# Patient Record
Sex: Male | Born: 1935 | Race: Black or African American | Hispanic: No | Smoking: Never smoker
Health system: Southern US, Community
[De-identification: ages and names within clinical notes are randomized; demographics above are authoritative.]

## PROBLEM LIST (undated history)

## (undated) DIAGNOSIS — E876 Hypokalemia: Secondary | ICD-10-CM

## (undated) DIAGNOSIS — I442 Atrioventricular block, complete: Secondary | ICD-10-CM

## (undated) DIAGNOSIS — Z8679 Personal history of other diseases of the circulatory system: Secondary | ICD-10-CM

## (undated) DIAGNOSIS — E119 Type 2 diabetes mellitus without complications: Secondary | ICD-10-CM

## (undated) DIAGNOSIS — E785 Hyperlipidemia, unspecified: Secondary | ICD-10-CM

## (undated) DIAGNOSIS — E669 Obesity, unspecified: Secondary | ICD-10-CM

## (undated) DIAGNOSIS — T7841XA Arthus phenomenon, initial encounter: Secondary | ICD-10-CM

## (undated) DIAGNOSIS — Z950 Presence of cardiac pacemaker: Secondary | ICD-10-CM

## (undated) DIAGNOSIS — I10 Essential (primary) hypertension: Secondary | ICD-10-CM

## (undated) DIAGNOSIS — I209 Angina pectoris, unspecified: Secondary | ICD-10-CM

## (undated) DIAGNOSIS — N183 Chronic kidney disease, stage 3 unspecified: Secondary | ICD-10-CM

## (undated) DIAGNOSIS — E109 Type 1 diabetes mellitus without complications: Secondary | ICD-10-CM

## (undated) DIAGNOSIS — I5033 Acute on chronic diastolic (congestive) heart failure: Secondary | ICD-10-CM

## (undated) HISTORY — DX: Type 2 diabetes mellitus without complications: E11.9

## (undated) HISTORY — DX: Chronic kidney disease, stage 3 unspecified: N18.30

## (undated) HISTORY — DX: Atrioventricular block, complete: I44.2

## (undated) HISTORY — DX: Angina pectoris, unspecified: I20.9

## (undated) HISTORY — DX: Acute on chronic diastolic (congestive) heart failure: I50.33

## (undated) HISTORY — DX: Essential (primary) hypertension: I10

## (undated) HISTORY — DX: Personal history of other diseases of the circulatory system: Z86.79

## (undated) HISTORY — DX: Arthus phenomenon, initial encounter: T78.41XA

## (undated) HISTORY — DX: Obesity, unspecified: E66.9

## (undated) HISTORY — DX: Hyperlipidemia, unspecified: E78.5

## (undated) HISTORY — DX: Hypokalemia: E87.6

## (undated) HISTORY — DX: Morbid (severe) obesity due to excess calories: E66.01

## (undated) HISTORY — DX: Presence of cardiac pacemaker: Z95.0

## (undated) HISTORY — DX: Type 1 diabetes mellitus without complications: E10.9

## (undated) HISTORY — DX: Chronic kidney disease, stage 3 (moderate): N18.3

---

## 2002-02-14 ENCOUNTER — Encounter: Admission: RE | Admit: 2002-02-14 | Discharge: 2002-02-26 | Payer: Self-pay | Admitting: Internal Medicine

## 2003-06-20 ENCOUNTER — Encounter (INDEPENDENT_AMBULATORY_CARE_PROVIDER_SITE_OTHER): Payer: Self-pay | Admitting: *Deleted

## 2004-06-22 ENCOUNTER — Ambulatory Visit: Payer: Self-pay | Admitting: Internal Medicine

## 2004-06-28 ENCOUNTER — Ambulatory Visit: Payer: Self-pay | Admitting: Internal Medicine

## 2005-04-18 ENCOUNTER — Ambulatory Visit: Payer: Self-pay | Admitting: Internal Medicine

## 2005-10-17 ENCOUNTER — Encounter: Admission: RE | Admit: 2005-10-17 | Discharge: 2005-10-17 | Payer: Self-pay | Admitting: Sports Medicine

## 2005-12-05 ENCOUNTER — Encounter: Admission: RE | Admit: 2005-12-05 | Discharge: 2005-12-05 | Payer: Self-pay | Admitting: Sports Medicine

## 2006-02-20 ENCOUNTER — Ambulatory Visit: Payer: Self-pay | Admitting: Internal Medicine

## 2006-02-27 ENCOUNTER — Ambulatory Visit: Payer: Self-pay | Admitting: Internal Medicine

## 2006-03-27 ENCOUNTER — Encounter (INDEPENDENT_AMBULATORY_CARE_PROVIDER_SITE_OTHER): Payer: Self-pay | Admitting: *Deleted

## 2006-03-27 ENCOUNTER — Inpatient Hospital Stay (HOSPITAL_COMMUNITY): Admission: RE | Admit: 2006-03-27 | Discharge: 2006-03-30 | Payer: Self-pay | Admitting: Orthopedic Surgery

## 2006-03-29 ENCOUNTER — Ambulatory Visit: Payer: Self-pay | Admitting: Internal Medicine

## 2006-04-05 HISTORY — PX: TOTAL HIP ARTHROPLASTY: SHX124

## 2006-10-31 ENCOUNTER — Ambulatory Visit: Payer: Self-pay | Admitting: Internal Medicine

## 2006-10-31 LAB — CONVERTED CEMR LAB
ALT: 20 units/L (ref 0–40)
AST: 20 units/L (ref 0–37)
BUN: 24 mg/dL — ABNORMAL HIGH (ref 6–23)
CO2: 29 meq/L (ref 19–32)
Calcium: 9.2 mg/dL (ref 8.4–10.5)
GFR calc Af Amer: 70 mL/min
GFR calc non Af Amer: 58 mL/min
Potassium: 4.9 meq/L (ref 3.5–5.1)
Total CHOL/HDL Ratio: 3.2
Triglycerides: 68 mg/dL (ref 0–149)

## 2006-11-02 ENCOUNTER — Ambulatory Visit: Payer: Self-pay | Admitting: Internal Medicine

## 2006-12-15 HISTORY — PX: CATARACT EXTRACTION: SUR2

## 2007-03-16 ENCOUNTER — Ambulatory Visit: Payer: Self-pay | Admitting: Internal Medicine

## 2007-03-31 ENCOUNTER — Telehealth: Payer: Self-pay | Admitting: Family Medicine

## 2007-06-29 ENCOUNTER — Encounter: Payer: Self-pay | Admitting: Internal Medicine

## 2007-07-02 ENCOUNTER — Telehealth: Payer: Self-pay | Admitting: Internal Medicine

## 2007-08-06 ENCOUNTER — Ambulatory Visit: Payer: Self-pay | Admitting: Internal Medicine

## 2007-08-06 DIAGNOSIS — T7841XA Arthus phenomenon, initial encounter: Secondary | ICD-10-CM

## 2007-08-06 DIAGNOSIS — E785 Hyperlipidemia, unspecified: Secondary | ICD-10-CM

## 2007-08-06 DIAGNOSIS — E1140 Type 2 diabetes mellitus with diabetic neuropathy, unspecified: Secondary | ICD-10-CM | POA: Insufficient documentation

## 2007-08-06 DIAGNOSIS — E109 Type 1 diabetes mellitus without complications: Secondary | ICD-10-CM

## 2007-08-06 DIAGNOSIS — I10 Essential (primary) hypertension: Secondary | ICD-10-CM

## 2007-08-06 DIAGNOSIS — E1159 Type 2 diabetes mellitus with other circulatory complications: Secondary | ICD-10-CM | POA: Insufficient documentation

## 2007-08-06 HISTORY — DX: Essential (primary) hypertension: I10

## 2007-08-06 HISTORY — DX: Hyperlipidemia, unspecified: E78.5

## 2007-08-06 HISTORY — DX: Type 1 diabetes mellitus without complications: E10.9

## 2007-08-06 LAB — CONVERTED CEMR LAB
ALT: 21 units/L (ref 0–53)
Albumin: 3.9 g/dL (ref 3.5–5.2)
BUN: 32 mg/dL — ABNORMAL HIGH (ref 6–23)
Calcium: 9.3 mg/dL (ref 8.4–10.5)
Chloride: 108 meq/L (ref 96–112)
Creatinine, Ser: 1.4 mg/dL (ref 0.4–1.5)
Creatinine,U: 128.1 mg/dL
GFR calc Af Amer: 64 mL/min
GFR calc non Af Amer: 53 mL/min
HDL: 42.6 mg/dL (ref >39.0)
Hgb A1c MFr Bld: 8.5 % — ABNORMAL HIGH (ref 4.6–6.0)
LDL Cholesterol: 89 mg/dL (ref 0–99)
Microalb, Ur: 44.9 mg/dL — ABNORMAL HIGH (ref 0.0–1.9)
PSA: 1.8 ng/mL (ref 0.10–4.00)
Potassium: 5.1 meq/L (ref 3.5–5.1)
Sodium: 141 meq/L (ref 135–145)
Triglycerides: 58 mg/dL (ref 0–149)
VLDL: 12 mg/dL (ref 0–40)

## 2007-08-07 ENCOUNTER — Encounter: Payer: Self-pay | Admitting: Internal Medicine

## 2007-08-23 ENCOUNTER — Encounter: Payer: Self-pay | Admitting: Internal Medicine

## 2007-11-15 ENCOUNTER — Telehealth: Payer: Self-pay | Admitting: Internal Medicine

## 2007-11-22 ENCOUNTER — Encounter: Payer: Self-pay | Admitting: Internal Medicine

## 2007-12-24 ENCOUNTER — Telehealth: Payer: Self-pay | Admitting: Internal Medicine

## 2008-01-22 ENCOUNTER — Telehealth: Payer: Self-pay | Admitting: Internal Medicine

## 2008-01-25 ENCOUNTER — Telehealth: Payer: Self-pay | Admitting: Internal Medicine

## 2008-01-28 ENCOUNTER — Ambulatory Visit: Payer: Self-pay | Admitting: Internal Medicine

## 2008-01-28 LAB — CONVERTED CEMR LAB
CO2: 25 meq/L (ref 19–32)
Cholesterol: 128 mg/dL (ref 0–200)
GFR calc Af Amer: 55 mL/min
Glucose, Bld: 90 mg/dL (ref 70–99)
Hgb A1c MFr Bld: 8.1 % — ABNORMAL HIGH (ref 4.6–6.0)
LDL Cholesterol: 82 mg/dL (ref 0–99)
Potassium: 4.5 meq/L (ref 3.5–5.1)
Sodium: 140 meq/L (ref 135–145)

## 2008-01-31 ENCOUNTER — Telehealth: Payer: Self-pay | Admitting: Internal Medicine

## 2008-02-04 ENCOUNTER — Ambulatory Visit: Payer: Self-pay | Admitting: Internal Medicine

## 2008-02-04 ENCOUNTER — Telehealth: Payer: Self-pay | Admitting: Internal Medicine

## 2008-02-19 ENCOUNTER — Ambulatory Visit: Payer: Self-pay | Admitting: Internal Medicine

## 2008-06-05 ENCOUNTER — Ambulatory Visit: Payer: Self-pay | Admitting: Internal Medicine

## 2008-07-09 ENCOUNTER — Telehealth: Payer: Self-pay | Admitting: Internal Medicine

## 2008-07-30 ENCOUNTER — Telehealth: Payer: Self-pay | Admitting: Internal Medicine

## 2008-08-11 ENCOUNTER — Telehealth: Payer: Self-pay | Admitting: Internal Medicine

## 2008-08-29 ENCOUNTER — Ambulatory Visit: Payer: Self-pay | Admitting: Internal Medicine

## 2008-08-29 DIAGNOSIS — E1121 Type 2 diabetes mellitus with diabetic nephropathy: Secondary | ICD-10-CM

## 2008-08-29 LAB — CONVERTED CEMR LAB
ALT: 18 units/L (ref 0–53)
Albumin: 3.7 g/dL (ref 3.5–5.2)
CO2: 23 meq/L (ref 19–32)
Cholesterol: 125 mg/dL (ref 0–200)
Creatinine, Ser: 1.8 mg/dL — ABNORMAL HIGH (ref 0.4–1.5)
GFR calc non Af Amer: 47.99 mL/min (ref >60)
Glucose, Bld: 108 mg/dL — ABNORMAL HIGH (ref 70–99)
HDL: 41 mg/dL (ref >39.00)
Hgb A1c MFr Bld: 8.4 % — ABNORMAL HIGH (ref 4.6–6.5)
Potassium: 4.5 meq/L (ref 3.5–5.1)
Sodium: 138 meq/L (ref 135–145)
Total Bilirubin: 1.1 mg/dL (ref 0.3–1.2)
Total Protein: 7.3 g/dL (ref 6.0–8.3)
Triglycerides: 80 mg/dL (ref 0.0–149.0)
VLDL: 16 mg/dL (ref 0.0–40.0)

## 2008-12-29 ENCOUNTER — Encounter: Payer: Self-pay | Admitting: Endocrinology

## 2009-02-24 ENCOUNTER — Ambulatory Visit: Payer: Self-pay | Admitting: Internal Medicine

## 2009-05-25 ENCOUNTER — Telehealth: Payer: Self-pay | Admitting: Internal Medicine

## 2009-09-07 ENCOUNTER — Telehealth: Payer: Self-pay | Admitting: Internal Medicine

## 2009-10-05 ENCOUNTER — Telehealth: Payer: Self-pay | Admitting: Internal Medicine

## 2009-10-16 ENCOUNTER — Encounter (INDEPENDENT_AMBULATORY_CARE_PROVIDER_SITE_OTHER): Payer: Self-pay | Admitting: *Deleted

## 2009-10-16 ENCOUNTER — Telehealth: Payer: Self-pay | Admitting: Gastroenterology

## 2009-10-19 ENCOUNTER — Ambulatory Visit: Payer: Self-pay | Admitting: Gastroenterology

## 2009-11-26 ENCOUNTER — Telehealth: Payer: Self-pay | Admitting: Internal Medicine

## 2009-12-02 ENCOUNTER — Telehealth: Payer: Self-pay | Admitting: Internal Medicine

## 2009-12-08 ENCOUNTER — Ambulatory Visit: Payer: Self-pay | Admitting: Gastroenterology

## 2009-12-10 ENCOUNTER — Encounter: Payer: Self-pay | Admitting: Gastroenterology

## 2009-12-31 ENCOUNTER — Encounter: Payer: Self-pay | Admitting: Internal Medicine

## 2010-01-04 ENCOUNTER — Telehealth: Payer: Self-pay | Admitting: Internal Medicine

## 2010-01-06 ENCOUNTER — Ambulatory Visit: Payer: Self-pay | Admitting: Internal Medicine

## 2010-01-06 ENCOUNTER — Encounter: Payer: Self-pay | Admitting: Internal Medicine

## 2010-01-06 LAB — CONVERTED CEMR LAB
ALT: 18 units/L (ref 0–53)
AST: 21 units/L (ref 0–37)
Alkaline Phosphatase: 93 units/L (ref 39–117)
BUN: 37 mg/dL — ABNORMAL HIGH (ref 6–23)
Bilirubin, Direct: 0.2 mg/dL (ref 0.0–0.3)
CO2: 25 meq/L (ref 19–32)
Calcium: 9.2 mg/dL (ref 8.4–10.5)
Cholesterol: 145 mg/dL (ref 0–200)
Creatinine, Ser: 1.6 mg/dL — ABNORMAL HIGH (ref 0.4–1.5)
GFR calc non Af Amer: 55.57 mL/min (ref >60)
Glucose, Bld: 128 mg/dL — ABNORMAL HIGH (ref 70–99)
PSA: 1.77 ng/mL (ref 0.10–4.00)
Phosphorus: 3.5 mg/dL (ref 2.3–4.6)
Sodium: 141 meq/L (ref 135–145)
TSH: 1.12 microintl units/mL (ref 0.35–5.50)
Total Bilirubin: 1 mg/dL (ref 0.3–1.2)
Total Protein: 7 g/dL (ref 6.0–8.3)
VLDL: 11.2 mg/dL (ref 0.0–40.0)

## 2010-01-09 ENCOUNTER — Encounter: Payer: Self-pay | Admitting: Internal Medicine

## 2010-01-09 LAB — CONVERTED CEMR LAB
Creatinine 24 HR UR: 1418 mg/24hr (ref 800–2000)
Creatinine Clearance: 60 mL/min — ABNORMAL LOW (ref 75–125)
Protein, Ur: 270 mg/24hr — ABNORMAL HIGH (ref 50–100)

## 2010-01-20 ENCOUNTER — Encounter: Payer: Self-pay | Admitting: Internal Medicine

## 2010-02-05 ENCOUNTER — Ambulatory Visit: Payer: Self-pay | Admitting: Internal Medicine

## 2010-04-15 ENCOUNTER — Ambulatory Visit: Payer: Self-pay | Admitting: Internal Medicine

## 2010-04-15 ENCOUNTER — Inpatient Hospital Stay (HOSPITAL_COMMUNITY): Admission: AD | Admit: 2010-04-15 | Discharge: 2010-04-17 | Payer: Self-pay | Admitting: Internal Medicine

## 2010-04-15 ENCOUNTER — Telehealth: Payer: Self-pay | Admitting: Internal Medicine

## 2010-04-15 ENCOUNTER — Ambulatory Visit: Payer: Self-pay | Admitting: Cardiology

## 2010-04-15 ENCOUNTER — Encounter: Payer: Self-pay | Admitting: Internal Medicine

## 2010-04-15 DIAGNOSIS — I209 Angina pectoris, unspecified: Secondary | ICD-10-CM

## 2010-04-15 HISTORY — PX: OTHER SURGICAL HISTORY: SHX169

## 2010-04-19 ENCOUNTER — Telehealth (INDEPENDENT_AMBULATORY_CARE_PROVIDER_SITE_OTHER): Payer: Self-pay | Admitting: *Deleted

## 2010-04-21 ENCOUNTER — Encounter: Payer: Self-pay | Admitting: Internal Medicine

## 2010-04-26 ENCOUNTER — Ambulatory Visit: Payer: Self-pay | Admitting: Internal Medicine

## 2010-04-26 DIAGNOSIS — E876 Hypokalemia: Secondary | ICD-10-CM

## 2010-04-26 LAB — CONVERTED CEMR LAB
Calcium: 9 mg/dL (ref 8.4–10.5)
Chloride: 108 meq/L (ref 96–112)
GFR calc non Af Amer: 55.52 mL/min — ABNORMAL LOW (ref >60.00)
Potassium: 4.8 meq/L (ref 3.5–5.1)

## 2010-05-03 ENCOUNTER — Ambulatory Visit: Payer: Self-pay

## 2010-05-03 ENCOUNTER — Encounter: Payer: Self-pay | Admitting: Internal Medicine

## 2010-06-15 NOTE — Miscellaneous (Signed)
Summary: lec previsit  Clinical Lists Changes  Medications: Added new medication of MOVIPREP 100 GM  SOLR (PEG-KCL-NACL-NASULF-NA ASC-C) As per prep instructions. - Signed Rx of MOVIPREP 100 GM  SOLR (PEG-KCL-NACL-NASULF-NA ASC-C) As per prep instructions.;  #1 x 0;  Signed;  Entered by: Karl Bales RN;  Authorized by: Meryl Dare MD [facility];  Method used: Print then Give to Patient Observations: Added new observation of NKA: T (10/19/2009 13:36)    Prescriptions: MOVIPREP 100 GM  SOLR (PEG-KCL-NACL-NASULF-NA ASC-C) As per prep instructions.  #1 x 0   Entered by:   Karl Bales RN   Authorized by:   Meryl Dare MD [facility]   Signed by:   Karl Bales RN on 10/19/2009   Method used:   Print then Give to Patient   RxID:   [id number]

## 2010-06-15 NOTE — Assessment & Plan Note (Signed)
Summary: RS  BUMP   PHONE   STC   Vital Signs:  Patient profile:   76 year old male Height:      69 inches Weight:      255 pounds BMI:     37.79 O2 Sat:      96 % Temp:     97.2 degrees F oral Pulse rate:   98 / minute BP sitting:   118 / 70  (left arm) Cuff size:   large  Vitals Entered By: Zackery Barefoot CMA (August 29, 2008 1:06 PM)  Primary Care Provider:  Jacques Navy MD   History of Present Illness: Has had a gastroenteritis with nausea and vomiting. They were out in [place] and got this, his wife also had the same symptoms, and has had symptoms since Thursday. Today he did have a recurrence of diarrhea. No blood or mucus in the stool, no abdominal pain or fever. He has had antibiotics prior to dental cleaning. Able to get food and fluids down without difficulty.   Diabetes - reports that his sugars have been OK.  Hyperlipidemia - due for follow-up lab.  Weight management: he reports that he may have finally heard what he has been told about weight and his health. He is making a serious effort to loose weight through smart food choices, decreased portion size and walking. He has lost a few lbs - even before this bout of diarrhea.  Current Medications (verified): 1)  Furosemide 40 Mg  Tabs (Furosemide) .... Take 1 Tablet By Mouth Once A Day 2)  Pravachol 40 Mg  Tabs (Pravastatin Sodium) .... Take 1 Tablet By Mouth Once A Day 3)  Lantus 100 Unit/ml  Soln (Insulin Glargine) .... Inject 20 Units Under The Skin Each Night 4)  Vasotec 20 Mg  Tabs (Enalapril Maleate) .... 2 Once Daily 5)  Humulin 70/30 70-30 % Susp (Insulin Isophane & Regular) .... 65 Units 6)  Adult Aspirin Low Strength 81 Mg Tbdp (Aspirin) .... Once Daily 7)  Osteo Bi-Flex Adv Triple St  Tabs (Misc Natural Products) .... Once Daily 8)  Bd Insulin Syringe Microfine 28g X 1/2" 0.5 Ml Misc (Insulin Syringe-Needle U-100) .... Use As Directed To Inject Lantus and Humulin 9)  Freestyle Lite Test  Strp  (Glucose Blood) .... Test Blood Glucose Three Times A Day (Dx 250.01)  Allergies (verified): No Known Drug Allergies  Past History:  Past Medical History:    ARTHUS PHENOMENON (ICD-995.21)    MORBID OBESITY (ICD-278.01)    ESSENTIAL HYPERTENSION (ICD-401.9)    HYPERLIPIDEMIA (ICD-272.4)    DIABETES MELLITUS, TYPE I, ADULT ONSET (ICD-250.01)  Past Surgical History:    Total hip replacement (04/05/2006)-right    Cataract extraction - August and September '08 Dagoberto Ligas)  Family History:    Reviewed history from 02/19/2008 and no changes required:       father-deceased @83 : CVA, DM, Lipid, HTN       mother-deceased @ 86: CAD/MI, Asthma       Mother's side - uncles and grandfather all died of MI before 50 yrs       Neg- colon cancer, no definite prostate cancer  Social History:    Reviewed history from 08/06/2007 and no changes required:       [employer], [employer] 2 master's degrees; [place] - for certification Education specialist.       Married - '61       2 sons - '62, '68;  5 grandchildren  retired: school principal       active - travels.  Review of Systems       The patient complains of decreased hearing and dyspnea on exertion.  The patient denies anorexia, weight loss, weight gain, chest pain, peripheral edema, abdominal pain, severe indigestion/heartburn, muscle weakness, difficulty walking, abnormal bleeding, and angioedema.    Physical Exam  General:  Obese AA male in NAD Head:  Normocephalic and atraumatic without obvious abnormalities. No apparent alopecia or balding. Eyes:  vision grossly intact, pupils equal, pupils round, corneas and lenses clear, and no injection.   Ears:  R ear normal and L ear normal.   Nose:  no external deformity and no external erythema.   Mouth:  good dentition and no gingival abnormalities.   Neck:  supple.   Lungs:  normal respiratory effort, normal breath sounds, and no wheezes.   Heart:  normal rate, regular rhythm, and no  murmur.   Abdomen:  obese Msk:  no joint tenderness, no joint swelling, no joint warmth, and no joint instability.   Pulses:  2+ radial and 2+ DP Extremities:  trace left pedal edema.   Neurologic:  alert & oriented X3, cranial nerves II-XII intact, and gait normal.   Skin:  turgor normal and color normal.   Cervical Nodes:  no anterior cervical adenopathy and no posterior cervical adenopathy.   Psych:  Oriented X3, memory intact for recent and remote, normally interactive, and good eye contact.    Diabetes Management Exam:    Foot Exam (with socks and/or shoes not present):       Sensory-Pinprick/Light touch:          Left medial foot (L-4): normal          Left dorsal foot (L-5): normal          Left lateral foot (S-1): normal       Sensory-other: decreased deep vibratory sensation    Eye Exam:       Eye Exam done elsewhere          Date: 07/30/2008          Results: normal          Done by: Manley Mason opthal -[place]   Impression & Recommendations:  Problem # 1:  MORBID OBESITY (ICD-278.01) He has started seriously adjusting his eating style to support weight loss as well as exercising. He has lost 9 lbs since his last visit.  Plan: continue current regiemen: goal to loos 1 lb/month  Problem # 2:  ESSENTIAL HYPERTENSION (ICD-401.9)  His updated medication list for this problem includes:    Furosemide 40 Mg Tabs (Furosemide) .Marland Kitchen... Take 1 tablet by mouth once a day    Vasotec 20 Mg Tabs (Enalapril maleate) .Marland Kitchen... 2 once daily  Orders: TLB-BMP (Basic Metabolic Panel-BMET) (80048-METABOL)  BP today: 118/70 Prior BP: 130/90 (02/19/2008)  Good Control.  Plan: continue present meds.  Problem # 3:  DIABETES MELLITUS, TYPE I, ADULT ONSET (ICD-250.01) Patient is adherent to medical regimen and is working on diet/weight loss. A1C 8.4% today.  Plan: redouble efforts on life-style modifications         continue present regimen: he is slowly cuttng back on his insulin as he eats  better and looses weight. He is less hungry on less insulin.         Gave encouragement  His updated medication list for this problem includes:    Lantus 100 Unit/ml Soln (Insulin glargine) ..... Inject 20 units under  the skin each night    Vasotec 20 Mg Tabs (Enalapril maleate) .Marland Kitchen... 2 once daily    Humulin 70/30 70-30 % Susp (Insulin isophane & regular) .Marland KitchenMarland KitchenMarland KitchenMarland Kitchen 65 units    Adult Aspirin Low Strength 81 Mg Tbdp (Aspirin) ..... Once daily  Orders: TLB-A1C / Hgb A1C (Glycohemoglobin) (83036-A1C)  Problem # 4:  CHRONIC KIDNEY DISEASE STAGE II (MILD) (ICD-585.2) Lab today reveals elevated creatnine. Chart reviewed for previous  values and trends:                   8/08               3/09           9/09           4/10                   1.3                 1.4             1.6             1.8  Etiology combined hypertension and poorly controlled diabetes.   Plan: 24 hour urine for creatnine clearance          Increase diligence on risk factor control: tight blood pressure control and bringing A1C to 7% or less          Continue ACE-I          May need nephrology consult this year.   Problem # 5:  Preventive Health Care (ICD-V70.0) Assessment: New Patient with up-to-date colorectal cancer screening; cardiac screening with NST '02. He seen opthalmology.  Plan: further evaluation of CKD.         follow-up A1C in 3 months         Follow-up on BP in 3 months plus home monitoring.  Problem # 6:  HYPERLIPIDEMIA (ICD-272.4)  His updated medication list for this problem includes:    Pravachol 40 Mg Tabs (Pravastatin sodium) .Marland Kitchen... Take 1 tablet by mouth once a day  Orders: TLB-Lipid Panel (80061-LIPID) TLB-Hepatic/Liver Function Pnl (80076-HEPATIC)  Cholesterol 125, HDL 41, LDL 68  Good control.  Complete Medication List: 1)  Furosemide 40 Mg Tabs (Furosemide) .... Take 1 tablet by mouth once a day 2)  Pravachol 40 Mg Tabs (Pravastatin sodium) .... Take 1 tablet by mouth once a day 3)   Lantus 100 Unit/ml Soln (Insulin glargine) .... Inject 20 units under the skin each night 4)  Vasotec 20 Mg Tabs (Enalapril maleate) .... 2 once daily 5)  Humulin 70/30 70-30 % Susp (Insulin isophane & regular) .... 65 units 6)  Adult Aspirin Low Strength 81 Mg Tbdp (Aspirin) .... Once daily 7)  Osteo Bi-flex Adv Triple St Tabs (Misc natural products) .... Once daily 8)  Bd Insulin Syringe Microfine 28g X 1/2" 0.5 Ml Misc (Insulin syringe-needle u-100) .... Use as directed to inject lantus and humulin 9)  Freestyle Lite Test Strp (Glucose blood) .... Test blood glucose three times a day (dx 250.01)    Tests: (1) BMP (METABOL)   Sodium                    138 mEq/L                   135-145   Potassium  4.5 mEq/L                   3.5-5.1   Chloride                  105 mEq/L                   96-112   Carbon Dioxide            23 mEq/L                    19-32   Glucose              [H]  108 mg/dL                   13-08   BUN                  [H]  39 mg/dL                    6-57   Creatinine           [H]  1.8 mg/dL                   8.4-6.9   Calcium                   9.1 mg/dL                   6.2-95.2   GFR                       47.99 mL/min                >60  Tests: (2) Lipid Panel (LIPID)   Cholesterol               125 mg/dL                   8-413     ATP III Classification            Desirable:  < 200 mg/dL                    Borderline High:  200 - 239 mg/dL               High:  > = 240 mg/dL   Triglycerides             80.0 mg/dL                  2.4-401.0     Normal:  <150 mg/dL     Borderline High:  272 - 199 mg/dL   HDL                       53.66 mg/dL                 >44.03   VLDL Cholesterol          16.0 mg/dL                  4.7-42.5   LDL Cholesterol           68 mg/dL                    9-56  CHO/HDL Ratio:  CHD Risk  3                    Men          Women     1/2 Average Risk     3.4          3.3     Average Risk           5.0          4.4     2X Average Risk          9.6          7.1     3X Average Risk          15.0          11.0                           Tests: (3) Hepatic/Liver Function Panel (HEPATIC)   Total Bilirubin           1.1 mg/dL                   1.0-2.7   Direct Bilirubin          0.2 mg/dL                   2.5-3.6   Alkaline Phosphatase      63 U/L                      39-117   AST                       24 U/L                      0-37   ALT                       18 U/L                      0-53   Total Protein             7.3 g/dL                    6.4-4.0   Albumin                   3.7 g/dL                    3.4-7.4  Tests: (4) Hemoglobin A1C (A1C)   Hemoglobin A1C       [H]  8.4 %                       4.6-6.5  Prescriptions: FREESTYLE LITE TEST  STRP (GLUCOSE BLOOD) Test Blood glucose three times a day (Dx 250.01)  #100 x 5   Entered and Authorized by:   Jacques Navy MD   Signed by:   Jacques Navy MD on 08/29/2008   Method used:   Print then Give to Patient   RxID:   [id number] BD INSULIN SYRINGE MICROFINE 28G X 1/2" 0.5 ML MISC (INSULIN SYRINGE-NEEDLE U-100) Use as directed to inject Lantus and Humulin  #100 x 5   Entered and Authorized by:   Jacques Navy MD   Signed by:   Jacques Navy MD  on 08/29/2008   Method used:   Print then Give to Patient   RxID:   [id number] HUMULIN 70/30 70-30 % SUSP (INSULIN ISOPHANE & REGULAR) 65 units  #2 x 12   Entered and Authorized by:   Jacques Navy MD   Signed by:   Jacques Navy MD on 08/29/2008   Method used:   Print then Give to Patient   RxID:   [id number] VASOTEC 20 MG  TABS (ENALAPRIL MALEATE) 2 once daily  #60 x 12   Entered and Authorized by:   Jacques Navy MD   Signed by:   Jacques Navy MD on 08/29/2008   Method used:   Print then Give to Patient   RxID:   [id number] LANTUS 100 UNIT/ML  SOLN (INSULIN GLARGINE) Inject 20 units under the skin each night  #10 x 12    Entered and Authorized by:   Jacques Navy MD   Signed by:   Jacques Navy MD on 08/29/2008   Method used:   Print then Give to Patient   RxID:   [id number] PRAVACHOL 40 MG  TABS (PRAVASTATIN SODIUM) Take 1 tablet by mouth once a day  #30 x 12   Entered and Authorized by:   Jacques Navy MD   Signed by:   Jacques Navy MD on 08/29/2008   Method used:   Print then Give to Patient   RxID:   [id number] FUROSEMIDE 40 MG  TABS (FUROSEMIDE) Take 1 tablet by mouth once a day  #30 x 12   Entered and Authorized by:   Jacques Navy MD   Signed by:   Jacques Navy MD on 08/29/2008   Method used:   Print then Give to Patient   RxID:   [id number]

## 2010-06-15 NOTE — Progress Notes (Signed)
  Phone Note Refill Request Message from:  Fax from Pharmacy on September 07, 2009 4:28 PM  Refills Requested: Medication #1:  HUMULIN 70/30 70-30 % SUSP 65 units Initial call taken by: Ami Bullins CMA,  September 07, 2009 4:28 PM    Prescriptions: HUMULIN 70/30 70-30 % SUSP (INSULIN ISOPHANE & REGULAR) 65 units  #2 x 2   Entered by:   Ami Bullins CMA   Authorized by:   Jacques Navy MD   Signed by:   Bill Salinas CMA on 09/07/2009   Method used:   Faxed to ...       [facility] (retail)       [address]       [place], [place]  [address]       Ph: [phone]       Fax: [phone]   RxID:   [id number]

## 2010-06-15 NOTE — Miscellaneous (Signed)
Summary: Device preload  Clinical Lists Changes  Observations: Added new observation of PPM INDICATN: CHB (04/21/2010 8:21) Added new observation of MAGNET RTE: BOL 100 ERI 85 (04/21/2010 8:21) Added new observation of PPMLEADSTAT2: active (04/21/2010 8:21) Added new observation of PPMLEADSER2: [id number] (04/21/2010 8:21) Added new observation of PPMLEADMOD2: 2088TC (04/21/2010 8:21) Added new observation of PPMLEADLOC2: RV (04/21/2010 8:21) Added new observation of PPMLEADSTAT1: active (04/21/2010 8:21) Added new observation of PPMLEADSER1: [id number] (04/21/2010 8:21) Added new observation of PPMLEADMOD1: 2088TC (04/21/2010 8:21) Added new observation of PPMLEADLOC1: RA (04/21/2010 8:21) Added new observation of PPM IMP MD: Lewayne Bunting, MD (04/21/2010 8:21) Added new observation of PPMLEADDOI2: 04/16/2010 (04/21/2010 8:21) Added new observation of PPMLEADDOI1: 04/16/2010 (04/21/2010 8:21) Added new observation of PPM DOI: 04/16/2010 (04/21/2010 8:21) Added new observation of PPM SERL#: [id number]  (04/21/2010 8:21) Added new observation of PPM MODL#: [id number]  (04/21/2010 0:10) Added new observation of PACEMAKERMFG: St Jude  (04/21/2010 8:21) Added new observation of PACEMAKER MD: Lewayne Bunting, MD  (04/21/2010 8:21)      PPM Specifications Following MD:  Lewayne Bunting, MD     PPM Vendor:  [employer] Jude     PPM Model Number:  [id number]     PPM Serial Number:  [id number] PPM DOI:  04/16/2010     PPM Implanting MD:  Lewayne Bunting, MD  Lead 1    Location: RA     DOI: 04/16/2010     Model #: [id number]     Serial #: [id number]     Status: active Lead 2    Location: RV     DOI: 04/16/2010     Model #: [id number]     Serial #: [id number]     Status: active  Magnet Response Rate:  BOL 100 ERI 85  Indications:  CHB

## 2010-06-15 NOTE — Letter (Signed)
Summary: [facility]   Imported By: Lennie Odor 01/08/2010 11:17:06  _____________________________________________________________________  External Attachment:    Type:   Image     Comment:   External Document

## 2010-06-15 NOTE — Letter (Signed)
Summary: [facility] Instructions   Gastroenterology  [address] [place], [place] [address]   Phone: [phone]  Fax: [phone]       AMANUEL SINKFIELD    29-Sep-1934    MRN: [id number]        Procedure Day /Date: Tuesday 12-08-09     Arrival Time:  7:30 a.m.     Procedure Time:  8:30 a.m.     Location of Procedure:                    _x_  [facility] (4th Floor)  PREPARATION FOR COLONOSCOPY WITH MOVIPREP   Starting 5 days prior to your procedure  12-03-09 Thursday  do not eat nuts, seeds, popcorn, corn, beans, peas,  salads, or any raw vegetables.  Do not take any fiber supplements (e.g. Metamucil, Citrucel, and Benefiber).  THE DAY BEFORE YOUR PROCEDURE         DATE:  12-07-09  DAY:  Monday  1.  Drink clear liquids the entire day-NO SOLID FOOD  2.  Do not drink anything colored red or purple.  Avoid juices with pulp.  No orange juice.  3.  Drink at least 64 oz. (8 glasses) of fluid/clear liquids during the day to prevent dehydration and help the prep work efficiently.  CLEAR LIQUIDS INCLUDE: Water Jello Ice Popsicles Tea (sugar ok, no milk/cream) Powdered fruit flavored drinks Coffee (sugar ok, no milk/cream) Gatorade Juice: apple, white grape, white cranberry  Lemonade Clear bullion, consomm, broth Carbonated beverages (any kind) Strained chicken noodle soup Hard Candy                             4.  In the morning, mix first dose of MoviPrep solution:    Empty 1 Pouch A and 1 Pouch B into the disposable container    Add lukewarm drinking water to the top line of the container. Mix to dissolve    Refrigerate (mixed solution should be used within 24 hrs)  5.  Begin drinking the prep at 5:00 p.m. The MoviPrep container is divided by 4 marks.   Every 15 minutes drink the solution down to the next mark (approximately 8 oz) until the full liter is complete.   6.  Follow completed prep with 16 oz of clear liquid of your choice (Nothing red or  purple).  Continue to drink clear liquids until bedtime.  7.  Before going to bed, mix second dose of MoviPrep solution:    Empty 1 Pouch A and 1 Pouch B into the disposable container    Add lukewarm drinking water to the top line of the container. Mix to dissolve    Refrigerate  THE DAY OF YOUR PROCEDURE      DATE:  12-08-09  DAY:  Tuesday  Beginning at  3:30 a.m. (5 hours before procedure):         1. Every 15 minutes, drink the solution down to the next mark (approx 8 oz) until the full liter is complete.  2. Follow completed prep with 16 oz. of clear liquid of your choice.    3. You may drink clear liquids until  6:30 a.m.  (2 HOURS BEFORE PROCEDURE).   MEDICATION INSTRUCTIONS  Unless otherwise instructed, you should take regular prescription medications with a small sip of water   as early as possible the morning of your procedure.  Diabetic patients - see separate instructions.  Additional medication instructions: Do not take Lasix the day of your procedure before coming in for procedure         OTHER INSTRUCTIONS  You will need a responsible adult at least 74 years of age to accompany you and drive you home.   This person must remain in the waiting room during your procedure.  Wear loose fitting clothing that is easily removed.  Leave jewelry and other valuables at home.  However, you may wish to bring a book to read or  an iPod/MP3 player to listen to music as you wait for your procedure to start.  Remove all body piercing jewelry and leave at home.  Total time from sign-in until discharge is approximately 2-3 hours.  You should go home directly after your procedure and rest.  You can resume normal activities the  day after your procedure.  The day of your procedure you should not:   Drive   Make legal decisions   Operate machinery   Drink alcohol   Return to work  You will receive specific instructions about eating, activities and medications  before you leave.    The above instructions have been reviewed and explained to me by   _______________________    I fully understand and can verbalize these instructions _____________________________ Date _________

## 2010-06-15 NOTE — Progress Notes (Signed)
Summary: Med refill  Phone Note Refill Request Message from:  Fax from Pharmacy on January 04, 2010 11:19 AM  Refills Requested: Medication #1:  PRAVACHOL 40 MG  TABS Take 1 tablet by mouth once a day  Medication #2:  HUMULIN 70/30 70-30 % SUSP 65 units Initial call taken by: Lucious Groves CMA,  January 04, 2010 11:19 AM    New/Updated Medications: PRAVACHOL 40 MG  TABS (PRAVASTATIN SODIUM) Take 1 tablet by mouth once a day Prescriptions: HUMULIN 70/30 70-30 % SUSP (INSULIN ISOPHANE & REGULAR) 65 units  #2 x 0   Entered by:   Lucious Groves CMA   Authorized by:   Jacques Navy MD   Signed by:   Lucious Groves CMA on 01/04/2010   Method used:   Faxed to ...       [facility] (retail)       [address]       [place], [place]  [address]       Ph: [phone]       Fax: [phone]   RxID:   [id number] PRAVACHOL 40 MG  TABS (PRAVASTATIN SODIUM) Take 1 tablet by mouth once a day  #30 x 0   Entered by:   Lucious Groves CMA   Authorized by:   Jacques Navy MD   Signed by:   Lucious Groves CMA on 01/04/2010   Method used:   Faxed to ...       [facility] (retail)       [address]       [place], [place]  [address]       Ph: [phone]       Fax: [phone]   RxID:   [id number]

## 2010-06-15 NOTE — Progress Notes (Signed)
Summary: Patiet Due for Colonoscopy  Phone Note Outgoing Call Call back at [facility] Phone [phone]   Call placed by: Harlow Mares CMA Duncan Dull),  October 16, 2009 10:39 AM Call placed to: Patient Summary of Call: called patient and advised him Dr. Russella Dar reviewed his chart and he is due for his colonoscopy his last colonoscopy was done by Dr. Victorino Dike in 2005. The patient had an EGD preformed by Dr, Russella Dar in the past so he would once agian become Dr. Anselm Jungling patient. The patient would like me to send this note to Dr. Debby Bud and make sure it is ok with him that the patient has his colonoscopy, so I advised him I would.  Initial call taken by: Harlow Mares CMA Duncan Dull),  October 16, 2009 10:43 AM  Follow-up for Phone Call        patients wife called back and scheduled her colonoscopy and his. Follow-up by: Harlow Mares CMA Duncan Dull),  October 16, 2009 11:51 AM

## 2010-06-15 NOTE — Progress Notes (Signed)
  Phone Note Refill Request Message from:  Fax from Pharmacy on December 02, 2009 11:07 AM  Refills Requested: Medication #1:  FUROSEMIDE 40 MG  TABS Take 1 tablet by mouth once a day  Medication #2:  LANTUS 100 UNIT/ML  SOLN Inject 20 units under the skin each night  Medication #3:  VASOTEC 20 MG  TABS 2 once daily Initial call taken by: Ami Bullins CMA,  December 02, 2009 11:08 AM    Prescriptions: VASOTEC 20 MG  TABS (ENALAPRIL MALEATE) 2 once daily  #60 x 1   Entered by:   Ami Bullins CMA   Authorized by:   Jacques Navy MD   Signed by:   Bill Salinas CMA on 12/02/2009   Method used:   Faxed to ...       [facility] (retail)       [address]       [place], [place]  [address]       Ph: [phone]       Fax: [phone]   RxID:   [id number] LANTUS 100 UNIT/ML  SOLN (INSULIN GLARGINE) Inject 20 units under the skin each night  #10 x 2   Entered by:   Bill Salinas CMA   Authorized by:   Jacques Navy MD   Signed by:   Bill Salinas CMA on 12/02/2009   Method used:   Faxed to ...       [facility] (retail)       [address]       [place], [place]  [address]       Ph: [phone]       Fax: [phone]   RxID:   [id number] FUROSEMIDE 40 MG  TABS (FUROSEMIDE) Take 1 tablet by mouth once a day  #30 x 1   Entered by:   Ami Bullins CMA   Authorized by:   Jacques Navy MD   Signed by:   Bill Salinas CMA on 12/02/2009   Method used:   Faxed to ...       [facility] (retail)       [address]       [place], [place]  [address]       Ph: [phone]       Fax: [phone]   RxID:   [id number]

## 2010-06-15 NOTE — Initial Assessments (Signed)
Summary: sob/SD   Vital Signs:  Patient profile:   76 year old male Height:      69 inches Weight:      261 pounds BMI:     38.68 O2 Sat:      98 % on Room air Temp:     97.5 degrees F oral Pulse rate:   34 / minute BP sitting:   102 / 52  (left arm) Cuff size:   regular  Vitals Entered By: Bill Salinas CMA (April 15, 2010 4:50 PM)  O2 Flow:  Room air CC: pt here with c/o SOB/ ab   Primary Care Provider:  Jacques Navy MD  CC:  pt here with c/o SOB/ ab.  History of Present Illness: Patient is seen acutely due to the on-set of acute DOE over the past two days. He reports that minimal activity leaves him breathless. He reports a full feeling in the upper chest but says this may be sinus drainage. This sensation does improve as he rests. He has not had any other intercurrent symptoms or illness. His risk factors include obesity, diabetes, hyperlipidemia, and hypertension. He is now admitted with 3rd  degree AV block and possible angina.   Current Medications (verified): 1)  Furosemide 40 Mg  Tabs (Furosemide) .... Take 1 Tablet By Mouth Once A Day 2)  Pravachol 40 Mg  Tabs (Pravastatin Sodium) .... Take 1 Tablet By Mouth Once A Day 3)  Lantus 100 Unit/ml  Soln (Insulin Glargine) .... Inject 20 Units Under The Skin Each Night 4)  Vasotec 20 Mg  Tabs (Enalapril Maleate) .... 2 Once Daily 5)  Humulin 70/30 70-30 % Susp (Insulin Isophane & Regular) .... 65 Units 6)  Adult Aspirin Low Strength 81 Mg Tbdp (Aspirin) .... Once Daily 7)  Osteo Bi-Flex Adv Triple St  Tabs (Misc Natural Products) .... Once Daily 8)  Bd Insulin Syringe Microfine 28g X 1/2" 0.5 Ml Misc (Insulin Syringe-Needle U-100) .... Use As Directed To Inject Lantus and Humulin 9)  Freestyle Lite Test  Strp (Glucose Blood) .... Test Blood Glucose Three Times A Day (Dx 250.01) 10)  Ciprofloxacin Hcl 500 Mg Tabs (Ciprofloxacin Hcl) .Marland Kitchen.. 1 By Mouth Two Times A Day X 5 Days For Traveler's Diarrhea, X 7 Days For Respiratory  or Skin Infection  Allergies (verified): No Known Drug Allergies  Past History:  Past Medical History: Last updated: 08/29/2008 ARTHUS PHENOMENON (ICD-995.21) MORBID OBESITY (ICD-278.01) ESSENTIAL HYPERTENSION (ICD-401.9) HYPERLIPIDEMIA (ICD-272.4) DIABETES MELLITUS, TYPE I, ADULT ONSET (ICD-250.01)  Past Surgical History: Last updated: 08/29/2008 Total hip replacement (04/05/2006)-right Cataract extraction - August and September '08 Dagoberto Ligas)  Family History: Last updated: 2008-02-26 father-deceased @83 : CVA, DM, Lipid, HTN mother-deceased @ 86: CAD/MI, Asthma Mother's side - uncles and grandfather all died of MI before 50 yrs Neg- colon cancer, no definite prostate cancer  Social History: Last updated: 08/06/2007 [employer], [employer] 2 master's degrees; [employer] - for certification Education specialist. Married - '61 2 sons - '62, '68;  5 grandchildren retired: school principal active - travels.  Review of Systems       The patient complains of chest pain and dyspnea on exertion.  The patient denies anorexia, fever, weight loss, weight gain, decreased hearing, hoarseness, syncope, peripheral edema, headaches, hemoptysis, abdominal pain, severe indigestion/heartburn, muscle weakness, difficulty walking, unusual weight change, and enlarged lymph nodes.    Physical Exam  General:  obese AA male who is no distress but is light-headed with exertion Head:  normocephalic and  atraumatic.   Eyes:  clouded lenses, reactive, sclera clear Ears:  External ear exam shows no significant lesions or deformities.  Otoscopic examination reveals clear canals, tympanic membranes are intact bilaterally without bulging, retraction, inflammation or discharge. Hearing is grossly normal bilaterally. Mouth:  Oral mucosa and oropharynx without lesions or exudates.  Teeth in good repair. Neck:  supple, no thyromegaly, and no carotid bruits.   Chest Wall:  Increase AP diameter Lungs:  normal  respiratory effort, normal breath sounds, no crackles, and no wheezes.   Heart:  severe bradycardia - irregular, no murmur.   Abdomen:  Obese, soft, no guarding or rebound Prostate:  deferred Msk:  No deformity or scoliosis noted of thoracic or lumbar spine.  no joint warmth, no redness over joints, no joint deformities, and no joint instability.   Pulses:  2+ radial  Extremities:  no edema or deformity Neurologic:  alert & oriented X3 and cranial nerves II-XII intact.   Skin:  turgor normal, color normal, no rashes, and no suspicious lesions.   Cervical Nodes:  no anterior cervical adenopathy and no posterior cervical adenopathy.   Psych:  Oriented X3, memory intact for recent and remote, normally interactive, and good eye contact.     Impression & Recommendations:  Problem # 1:  ATRIOVENTRICULAR BLOCK, 3RD DEGREE (ICD-426.0) Patient presenting in complete heart block along with severe DOE and hypotension. He is at high risk for cardiovascular disease  Plan - tele/CCU admit           Cardiology consult for possible emergent pacing.  Problem # 2:  ANGINA, ATYPICAL (ICD-413.9)  Patient with a very high risk profile for CAD now with heart block and atypical chest pain.  Plan - CCU admit           cardiac enzymes x 3, BNP, Cmet, A1C, TSH,    His updated medication list for this problem includes:    Furosemide 40 Mg Tabs (Furosemide) .Marland Kitchen... Take 1 tablet by mouth once a day    Vasotec 20 Mg Tabs (Enalapril maleate) .Marland Kitchen... 2 once daily    Adult Aspirin Low Strength 81 Mg Tbdp (Aspirin) ..... Once daily  Orders: No Charge Patient Arrived (NCPA0) (NCPA0)  Problem # 3:  CHRONIC KIDNEY DISEASE STAGE II (MILD) (ICD-585.2) Will check routine labs. Will be careful for any dye studies.  Problem # 4:  ESSENTIAL HYPERTENSION (ICD-401.9) Patient BP is low due to problems #1, 2  Plan - hold meds until stable.  His updated medication list for this problem includes:    Furosemide 40 Mg Tabs  (Furosemide) .Marland Kitchen... Take 1 tablet by mouth once a day    Vasotec 20 Mg Tabs (Enalapril maleate) .Marland Kitchen... 2 once daily  Problem # 5:  DIABETES MELLITUS, TYPE I, ADULT ONSET (ICD-250.01) Will follow with sliding scale. Will hold lantus until medically stable.  His updated medication list for this problem includes:    Lantus 100 Unit/ml Soln (Insulin glargine) ..... Inject 20 units under the skin each night    Vasotec 20 Mg Tabs (Enalapril maleate) .Marland Kitchen... 2 once daily    Humulin 70/30 70-30 % Susp (Insulin isophane & regular) .Marland KitchenMarland KitchenMarland KitchenMarland Kitchen 65 units    Adult Aspirin Low Strength 81 Mg Tbdp (Aspirin) ..... Once daily  Complete Medication List: 1)  Furosemide 40 Mg Tabs (Furosemide) .... Take 1 tablet by mouth once a day 2)  Pravachol 40 Mg Tabs (Pravastatin sodium) .... Take 1 tablet by mouth once a day 3)  Lantus 100 Unit/ml  Soln (Insulin glargine) .... Inject 20 units under the skin each night 4)  Vasotec 20 Mg Tabs (Enalapril maleate) .... 2 once daily 5)  Humulin 70/30 70-30 % Susp (Insulin isophane & regular) .... 65 units 6)  Adult Aspirin Low Strength 81 Mg Tbdp (Aspirin) .... Once daily 7)  Osteo Bi-flex Adv Triple St Tabs (Misc natural products) .... Once daily 8)  Bd Insulin Syringe Microfine 28g X 1/2" 0.5 Ml Misc (Insulin syringe-needle u-100) .... Use as directed to inject lantus and humulin 9)  Freestyle Lite Test Strp (Glucose blood) .... Test blood glucose three times a day (dx 250.01) 10)  Ciprofloxacin Hcl 500 Mg Tabs (Ciprofloxacin hcl) .Marland Kitchen.. 1 by mouth two times a day x 5 days for traveler's diarrhea, x 7 days for respiratory or skin infection   Orders Added: 1)  No Charge Patient Arrived (NCPA0) [NCPA0]

## 2010-06-15 NOTE — Progress Notes (Signed)
Summary: LABS?  Phone Note Call from Patient   Caller: Wife -  Summary of Call: Pt's wife was told to have labs when d/c'd from the hospital. Possibly potassium? They want to do labs here at [facility], are you aware of what labs pt needs?  Initial call taken by: Lamar Sprinkles, CMA,  April 19, 2010 10:27 AM  Follow-up for Phone Call        not sure. Will check CBC -285.9, metabolicn401.9, 995.20. That should do it. Thanks Follow-up by: Jacques Navy MD,  April 19, 2010 2:29 PM  Additional Follow-up for Phone Call Additional follow up Details #1::        called pt no answer will call back later Additional Follow-up by: Ami Bullins CMA,  April 19, 2010 3:57 PM    Additional Follow-up for Phone Call Additional follow up Details #2::    Labs in IDX.................Marland KitchenLamar Sprinkles, CMA  April 19, 2010 4:15 PM   Called spoke with wife and advised per MD. She states that lab order is no longer needed bc caridology called here and gave lab orders for 04/26/10 and will send them to Dr. Debby Bud.[name] CMA  April 20, 2010 11:41 AM

## 2010-06-15 NOTE — Progress Notes (Signed)
  Phone Note Refill Request Message from:  Fax from Pharmacy on November 26, 2009 8:53 AM  Refills Requested: Medication #1:  BD INSULIN SYRINGE MICROFINE 28G X 1/2" 0.5 ML MISC Use as directed to inject Lantus and Humulin Initial call taken by: Ami Bullins CMA,  November 26, 2009 8:53 AM    Prescriptions: BD INSULIN SYRINGE MICROFINE 28G X 1/2" 0.5 ML MISC (INSULIN SYRINGE-NEEDLE U-100) Use as directed to inject Lantus and Humulin  #100 x 5   Entered by:   Ami Bullins CMA   Authorized by:   Jacques Navy MD   Signed by:   Bill Salinas CMA on 11/26/2009   Method used:   Faxed to ...       [facility] (retail)       [address]       [place], [place]  [address]       Ph: [phone]       Fax: [phone]   RxID:   [id number]

## 2010-06-15 NOTE — Letter (Signed)
Summary: Diabetic Instructions  [facility]  [address] [place], [place] [address]   Phone: [phone]  Fax: [phone]    Connor Dixon April 12, 1935 MRN: [id number]    ________________________________________________________________________  _x _   INSULIN (LONG ACTING) MEDICATION INSTRUCTIONS (Lantus, NPH, 70/30, Humulin, Novolin-N)   The day before your procedure:   Take  your regular evening dose    The day of your procedure:   Do not take your morning dose

## 2010-06-15 NOTE — Procedures (Signed)
Summary: Colonoscopy  Patient: [name] Note: All result statuses are Final unless otherwise noted.  Tests: (1) Colonoscopy (COL)   COL Colonoscopy           DONE     [facility]     [address]     [place], [place]  [address]           COLONOSCOPY PROCEDURE REPORT           PATIENT:  Connor Dixon, Connor Dixon  MR#:  [id number]     BIRTHDATE:  1934/09/14, 74 yrs. old  GENDER:  male     ENDOSCOPIST:  Judie Petit T. Russella Dar, MD, [facility]           PROCEDURE DATE:  12/08/2009     PROCEDURE:  Colonoscopy with biopsy     ASA CLASS:  Class II     INDICATIONS:  1) Routine Risk Screening     MEDICATIONS:   Fentanyl 75 mcg IV, Versed 7 mg IV     DESCRIPTION OF PROCEDURE:   After the risks benefits and     alternatives of the procedure were thoroughly explained, informed     consent was obtained. Digital rectal exam was performed and     revealed no abnormalities. The LB PCF-H180AL [id number] and LB     CF-H180AL [id number] endoscope was introduced through the anus and     advanced to the ileocecal valve, limited by a tortuous and     redudant colon. The quality of the prep was excellent, using     MoviPrep.  The instrument was then slowly withdrawn as the colon     was fully examined.     <<PROCEDUREIMAGES>>     FINDINGS:  A sessile polyp was found in the ascending colon. It     was 4 mm in size. The polyp was removed using cold biopsy forceps.     Two polyps were found at the hepatic flexure. They were 4 - 5 mm     in size. The polyps were removed using cold biopsy forceps.  Mild     diverticulosis was found in the ascending colon.  Melanosis coli     was found in the rectum and sigmoid colon.  This was otherwise a     normal examination of the colon.   Retroflexed views in the rectum     revealed no abnormalities.  The time to cecum =  25.5  minutes.     The scope was then withdrawn (time =  12  min) from the patient     and the procedure completed.           COMPLICATIONS:  None        ENDOSCOPIC IMPRESSION:     1) 4 mm sessile polyp in the ascending colon     2) 4 - 5 mm, two polyps at the hepatic flexure     3) Mild diverticulosis in the ascending colon     4) Melanosis in the rectum and sigmoid colon           RECOMMENDATIONS:     1) Await pathology results     2) If the polyps removed today are adenomatous (pre-cancerous),     you will need a repeat colonoscopy in 5 years. Otherwise you     should continue to follow colorectal cancer screening guidelines     for "routine risk" patients with colonoscopy in 10 years.     Venita Lick. Russella Dar,  MD, [name]           CC: Jacques Navy, MD           n.     Rosalie DoctorVenita Lick. Stark at 12/08/2009 02:50 PM           Lonna Duval, [id number]  Note: An exclamation mark (!) indicates a result that was not dispersed into the flowsheet. Document Creation Date: 12/08/2009 2:50 PM _______________________________________________________________________  (1) Order result status: Final Collection or observation date-time: 12/08/2009 09:21 Requested date-time:  Receipt date-time:  Reported date-time:  Referring Physician:   Ordering Physician: Claudette Head [id number]) Specimen Source:  Source: Launa Grill Order Number: [id number] Lab site:   Appended Document: Colonoscopy     Procedures Next Due Date:    Colonoscopy: 12/2014

## 2010-06-15 NOTE — Letter (Signed)
Summary: Patient Notice- Polyp Results  [facility]  [address] [place], [place] [address]   Phone: [phone]  Fax: [phone]        December 10, 2009 MRN: [id number]    RISHAAN GUNNER [address] [place], [place]  [address]    Dear Mr. NORGAARD,  I am pleased to inform you that the colon polyp(s) removed during your recent colonoscopy was (were) found to be benign (no cancer detected) upon pathologic examination.  I recommend you have a repeat colonoscopy examination in 5 years to look for recurrent polyps, as having colon polyps increases your risk for having recurrent polyps or even colon cancer in the future.  Should you develop new or worsening symptoms of abdominal pain, bowel habit changes or bleeding from the rectum or bowels, please schedule an evaluation with either your primary care physician or with me.  Continue treatment plan as outlined the day of your exam.  Please call us if you are having persistent problems or have questions about your condition that have not been fully answered at this time.  Sincerely,  Meryl Dare MD [facility]  This letter has been electronically signed by your physician.  Appended Document: Patient Notice- Polyp Results letter mailed 8.1.2011

## 2010-06-15 NOTE — Progress Notes (Signed)
Summary: OV TODAY  Phone Note Call from Patient   Summary of Call: Pt c/o feeling "winded" after only a few steps. He is currently in [facility] - OK to add on end of day as soon as pt can get to office. Wife informed.  Initial call taken by: Lamar Sprinkles, CMA,  April 15, 2010 11:33 AM

## 2010-06-15 NOTE — Assessment & Plan Note (Signed)
Summary: YEARLY FU/ MEDICARE/ NEEDS REFILLS ON INSULIN/NWS   Vital Signs:  Patient profile:   76 year old male Height:      69 inches Weight:      265 pounds BMI:     39.28 O2 Sat:      96 % on Room air Temp:     97.9 degrees F oral Pulse rate:   83 / minute BP sitting:   132 / 90  (left arm) Cuff size:   regular  Vitals Entered By: Bill Salinas CMA (January 06, 2010 9:47 AM)  O2 Flow:  Room air CC: yearly / ab  Vision Screening:      Vision Comments: Pt had eye exam August 2011. Stable exam no further changes. Dr Dagoberto Ligas Background diabetic retinopathy was detected, but only requires monitoring (left eye) Next eye exam due in 1 year   Primary Care Marquisha Nikolov:  Jacques Navy MD  CC:  yearly / ab.  History of Present Illness: Patient presents for routine medical follow-up. In the interval he has been having close follow-up for right hip ORIF with DePUy prosthesis - and his orthopedist has been checking blood work and x-rays with no problem to date. He does have some minor discomfort. He does report having some chronic back pain but this is manageable.  No major illness or injury. He did have a colonoscopy with polypectomy with final path being tubular adenoma. Generally feels well and able to be as active as he wants to be. He is not exercising.  Preventive Screening-Counseling & Management  Alcohol-Tobacco     Alcohol drinks/day: occasionally     Alcohol type: Beer, wine and whiskey     Smoking Status: quit     Year Quit: 45 years ago  Caffeine-Diet-Exercise     Caffeine use/day: 4 drinks per day     Does Patient Exercise: no     MSH Depression Score: depressed  Hep-HIV-STD-Contraception     Dental Visit-last 6 months yes     Sun Exposure-Excessive: no  Safety-Violence-Falls     Seat Belt Use: yes     Firearms in the Home: firearms in the home     Smoke Detectors: yes     Violence in the Home: no risk noted     Sexual Abuse: no     Fall Risk: No falls in  the past year  Current Medications (verified): 1)  Furosemide 40 Mg  Tabs (Furosemide) .... Take 1 Tablet By Mouth Once A Day 2)  Pravachol 40 Mg  Tabs (Pravastatin Sodium) .... Take 1 Tablet By Mouth Once A Day 3)  Lantus 100 Unit/ml  Soln (Insulin Glargine) .... Inject 20 Units Under The Skin Each Night 4)  Vasotec 20 Mg  Tabs (Enalapril Maleate) .... 2 Once Daily 5)  Humulin 70/30 70-30 % Susp (Insulin Isophane & Regular) .... 65 Units 6)  Adult Aspirin Low Strength 81 Mg Tbdp (Aspirin) .... Once Daily 7)  Osteo Bi-Flex Adv Triple St  Tabs (Misc Natural Products) .... Once Daily 8)  Bd Insulin Syringe Microfine 28g X 1/2" 0.5 Ml Misc (Insulin Syringe-Needle U-100) .... Use As Directed To Inject Lantus and Humulin 9)  Freestyle Lite Test  Strp (Glucose Blood) .... Test Blood Glucose Three Times A Day (Dx 250.01)  Allergies (verified): No Known Drug Allergies  Past History:  Past Medical History: Last updated: 08/29/2008 ARTHUS PHENOMENON (ICD-995.21) MORBID OBESITY (ICD-278.01) ESSENTIAL HYPERTENSION (ICD-401.9) HYPERLIPIDEMIA (ICD-272.4) DIABETES MELLITUS, TYPE I, ADULT ONSET (ICD-250.01)  Past Surgical History: Last updated: 08/29/2008 Total hip replacement (04/05/2006)-right Cataract extraction - August and September '08 Dagoberto Ligas)  Family History: Last updated: Feb 26, 2008 father-deceased @83 : CVA, DM, Lipid, HTN mother-deceased @ 86: CAD/MI, Asthma Mother's side - uncles and grandfather all died of MI before 50 yrs Neg- colon cancer, no definite prostate cancer  Social History: Last updated: 08/06/2007 [employer], [employer] 2 master's degrees; [place] - for certification Education specialist. Married - '61 2 sons - '62, '68;  5 grandchildren retired: school principal active - travels.  Risk Factors: Alcohol Use: occasionally (01/06/2010) Caffeine Use: 4 drinks per day (01/06/2010) Exercise: no (01/06/2010)  Risk Factors: Smoking Status: quit  (01/06/2010)  Social History: Caffeine use/day:  4 drinks per day Dental Care w/in 6 mos.:  yes Sun Exposure-Excessive:  no Seat Belt Use:  yes Fall Risk:  No falls in the past year  Review of Systems       The patient complains of weight gain.  The patient denies anorexia, fever, weight loss, vision loss, decreased hearing, hoarseness, chest pain, dyspnea on exertion, abdominal pain, severe indigestion/heartburn, hematuria, incontinence, muscle weakness, transient blindness, depression, abnormal bleeding, and angioedema.         sinus drainage   Physical Exam  General:  obese AA male in no distress Head:  normocephalic, atraumatic, and no abnormalities observed.   Eyes:  vision grossly intact, pupils equal, pupils round, pupils reactive to light, corneas and lenses clear, and no injection.   Ears:  External ear exam shows no significant lesions or deformities.  Otoscopic examination reveals clear canals, tympanic membranes are intact bilaterally without bulging, retraction, inflammation or discharge. Hearing is grossly normal bilaterally. Nose:  no external deformity and no external erythema.   Mouth:  Oral mucosa and oropharynx without lesions or exudates.  Teeth in good repair. Neck:  supple, full ROM, no thyromegaly, and no carotid bruits.   Chest Wall:  no deformities and no tenderness.   Breasts:  gynecomastia secondary to obesity.   Lungs:  Normal respiratory effort, chest expands symmetrically. Lungs are clear to auscultation, no crackles or wheezes. Heart:  Normal rate and regular rhythm. S1 and S2 normal without gallop, murmur, click, rub or other extra sounds. Abdomen:  obese, soft, non-tender, normal bowel sounds, no guarding, and no rigidity.   Rectal:  deferred Msk:  normal ROM, no joint tenderness, no joint swelling, no joint warmth, and no joint deformities.   Pulses:  2+ radial and DP pulses Extremities:  No clubbing, cyanosis, edema, or deformity noted with normal  full range of motion of all joints.   Neurologic:  alert & oriented X3, cranial nerves II-XII intact, strength normal in all extremities, gait normal, and DTRs symmetrical and normal.   Skin:  turgor normal, color normal, no rashes, no suspicious lesions, and no ulcerations.   Cervical Nodes:  no anterior cervical adenopathy and no posterior cervical adenopathy.   Psych:  Oriented X3, memory intact for recent and remote, normally interactive, and good eye contact.  Cognitively intact managing his personal affairs and business without difficulty, able to make complex travel arrangements and schedules his own transportation.  Diabetes Management Exam:    Foot Exam (with socks and/or shoes not present):       Sensory-Pinprick/Light touch:          Left medial foot (L-4): normal          Left dorsal foot (L-5): normal          Left lateral  foot (S-1): normal          Right medial foot (L-4): normal          Right dorsal foot (L-5): normal          Right lateral foot (S-1): normal       Sensory-other: diminished deep vibratory sensation distal foot       Inspection:          Left foot: normal          Right foot: normal       Nails:          Left foot: normal          Right foot: normal    Eye Exam:       Eye Exam done elsewhere          Date: 12/31/2009          Results: diabetic retinopathy          Done by: Dr. Dagoberto Ligas   Impression & Recommendations:  Problem # 1:  CHRONIC KIDNEY DISEASE STAGE II (MILD) (ICD-585.2) Patient's last creatinine was 1.8. He is consistently out of control re: diabetes  Plan - 24 hr urine for quantitative creatinine clearance and total protein  Orders: TLB-Renal Function Panel (80069-RENAL) T-Urine 24 Hr. Protein [phone]) T-Urine 24 Hr. Creatinine Clearance (09811-91478)  Problem # 2:  MORBID OBESITY (ICD-278.01) Reviewed with the patient the ULTIMATE importance of controlling his weight.  Plan - smart choice diet, PORTION SIZE CONTROL,  daily exercise. Target is to loose 65 lbs, goal is to loose 1 lb/month, 12 lbs per year.  Problem # 3:  ESSENTIAL HYPERTENSION (ICD-401.9)  His updated medication list for this problem includes:    Furosemide 40 Mg Tabs (Furosemide) .Marland Kitchen... Take 1 tablet by mouth once a day    Vasotec 20 Mg Tabs (Enalapril maleate) .Marland Kitchen... 2 once daily  BP today: 132/90 Prior BP: 118/70 (08/29/2008)  Adequate control of BP-will continue present meds  Problem # 4:  HYPERLIPIDEMIA (ICD-272.4) Previous lab with good control.  Plan - routine lab with recommendations to follow  His updated medication list for this problem includes:    Pravachol 40 Mg Tabs (Pravastatin sodium) .Marland Kitchen... Take 1 tablet by mouth once a day  Orders: TLB-Lipid Panel (80061-LIPID) TLB-Hepatic/Liver Function Pnl (80076-HEPATIC) TLB-TSH (Thyroid Stimulating Hormone) (84443-TSH)  Addendum - good control with LDL 90  Problem # 5:  DIABETES MELLITUS, TYPE I, ADULT ONSET (ICD-250.01)  His updated medication list for this problem includes:    Lantus 100 Unit/ml Soln (Insulin glargine) ..... Inject 20 units under the skin each night    Vasotec 20 Mg Tabs (Enalapril maleate) .Marland Kitchen... 2 once daily    Humulin 70/30 70-30 % Susp (Insulin isophane & regular) .Marland KitchenMarland KitchenMarland KitchenMarland Kitchen 65 units    Adult Aspirin Low Strength 81 Mg Tbdp (Aspirin) ..... Once daily  Orders: TLB-A1C / Hgb A1C (Glycohemoglobin) (83036-A1C) EKG w/ Interpretation (93000)  Labs Reviewed: Creat: 1.8 (08/29/2008)     Last Eye Exam: diabetic retinopathy (12/31/2009) Reviewed HgBA1c results: 8.4 (08/29/2008)  8.1 (01/28/2008)  Chronically out of control. Reviewed the importance of control re: target organ damage - renal failure, heart failure and coronary disease, peripheral vascular disease.   Plan - A1C with recommendations to follow           DIET MANAGEMENT - no sugar, low carbs.  Addendum - A1C 9.1%  Problem # 6:  Preventive Health Care (ICD-V70.0)  Interval history per HPI.  Physical  exam OK except for obesity. Current with colonosocopy. Immunizations - will need paper record to check on pneumo-vax, tetnus. He will need shingles vaccine. 12 Lead EKG with RBBB, t-wave inversion across precordial leads, no acute change or ischemia.   patient with no signs or symptoms of depression. He is counseled on weight and exercise. He is cautioned about fall risk in setting of developing peripheral neuropathy and cautioned about injury especially with lifting. He remains independent in all ADLs  In summary - a very nice man who needs better discipline in regard to controlling his diabetes and weight. He will return in 6 months.   Orders: [facility] -Subsequent Annual Wellness Visit [phone])  Complete Medication List: 1)  Furosemide 40 Mg Tabs (Furosemide) .... Take 1 tablet by mouth once a day 2)  Pravachol 40 Mg Tabs (Pravastatin sodium) .... Take 1 tablet by mouth once a day 3)  Lantus 100 Unit/ml Soln (Insulin glargine) .... Inject 20 units under the skin each night 4)  Vasotec 20 Mg Tabs (Enalapril maleate) .... 2 once daily 5)  Humulin 70/30 70-30 % Susp (Insulin isophane & regular) .... 65 units 6)  Adult Aspirin Low Strength 81 Mg Tbdp (Aspirin) .... Once daily 7)  Osteo Bi-flex Adv Triple St Tabs (Misc natural products) .... Once daily 8)  Bd Insulin Syringe Microfine 28g X 1/2" 0.5 Ml Misc (Insulin syringe-needle u-100) .... Use as directed to inject lantus and humulin 9)  Freestyle Lite Test Strp (Glucose blood) .... Test blood glucose three times a day (dx 250.01) 10)  Ciprofloxacin Hcl 500 Mg Tabs (Ciprofloxacin hcl) .Marland Kitchen.. 1 by mouth two times a day x 5 days for traveler's diarrhea, x 7 days for respiratory or skin infection  Other Orders: TLB-PSA (Prostate Specific Antigen) (84153-PSA) Prescriptions: CIPROFLOXACIN HCL 500 MG TABS (CIPROFLOXACIN HCL) 1 by mouth two times a day x 5 days for traveler's diarrhea, x 7 days for respiratory or skin infection  #20 x 1   Entered  and Authorized by:   Jacques Navy MD   Signed by:   Jacques Navy MD on 01/06/2010   Method used:   Faxed to ...       [facility] (retail)       [address]       [place], [place]  [address]       Ph: [phone]       Fax: [phone]   RxID:   [id number] HUMULIN 70/30 70-30 % SUSP (INSULIN ISOPHANE & REGULAR) 65 units  #2 x 12   Entered and Authorized by:   Jacques Navy MD   Signed by:   Jacques Navy MD on 01/06/2010   Method used:   Faxed to ...       [facility] (retail)       [address]       [place], [place]  [address]       Ph: [phone]       Fax: [phone]   RxID:   [id number] VASOTEC 20 MG  TABS (ENALAPRIL MALEATE) 2 once daily  #60 x 12   Entered and Authorized by:   Jacques Navy MD   Signed by:   Jacques Navy MD on 01/06/2010   Method used:   Faxed to ...       [facility] (retail)  [address]       [place], [place]  [address]       Ph: [phone]       Fax: [phone]   RxID:   [id number] LANTUS 100 UNIT/ML  SOLN (INSULIN GLARGINE) Inject 20 units under the skin each night  #10 x 12   Entered and Authorized by:   Jacques Navy MD   Signed by:   Jacques Navy MD on 01/06/2010   Method used:   Faxed to ...       [facility] (retail)       [address]       [place], [place]  [address]       Ph: [phone]       Fax: [phone]   RxID:   [id number] PRAVACHOL 40 MG  TABS (PRAVASTATIN SODIUM) Take 1 tablet by mouth once a day  #30 x 12   Entered and Authorized by:   Jacques Navy MD   Signed by:   Jacques Navy MD on 01/06/2010   Method used:   Faxed to ...       [facility] (retail)       [address]       [place], [place]  [address]       Ph: [phone]       Fax: [phone]   RxID:   [id number] FUROSEMIDE 40 MG   TABS (FUROSEMIDE) Take 1 tablet by mouth once a day  #30 x 12   Entered and Authorized by:   Jacques Navy MD   Signed by:   Jacques Navy MD on 01/06/2010   Method used:   Faxed to ...       [facility] (retail)       [address]       [place], [place]  [address]       Ph: [phone]       Fax: [phone]   RxID:   [id number]

## 2010-06-15 NOTE — Progress Notes (Signed)
  Phone Note Refill Request Message from:  Fax from Pharmacy on May 25, 2009 1:34 PM  Refills Requested: Medication #1:  BD INSULIN SYRINGE MICROFINE 28G X 1/2" 0.5 ML MISC Use as directed to inject Lantus and Humulin Initial call taken by: Ami Bullins CMA,  May 25, 2009 1:34 PM    Prescriptions: BD INSULIN SYRINGE MICROFINE 28G X 1/2" 0.5 ML MISC (INSULIN SYRINGE-NEEDLE U-100) Use as directed to inject Lantus and Humulin  #100 x 5   Entered by:   Ami Bullins CMA   Authorized by:   Jacques Navy MD   Signed by:   Bill Salinas CMA on 05/25/2009   Method used:   Faxed to ...       [facility] (retail)       [address]       [place], [place]  [address]       Ph: [phone]       Fax: [phone]   RxID:   [id number]

## 2010-06-15 NOTE — Assessment & Plan Note (Signed)
Summary: flu shot/men/cd  Nurse Visit   Allergies: No Known Drug Allergies  Orders Added: 1)  Flu Vaccine 38yrs + MEDICARE PATIENTS [Q2039] 2)  Administration Flu vaccine - MCR [G0008]      Flu Vaccine Consent Questions     Do you have a history of severe allergic reactions to this vaccine? no    Any prior history of allergic reactions to egg and/or gelatin? no    Do you have a sensitivity to the preservative Thimersol? no    Do you have a past history of Guillan-Barre Syndrome? no    Do you currently have an acute febrile illness? no    Have you ever had a severe reaction to latex? no    Vaccine information given and explained to patient? yes    Are you currently pregnant? no    Lot Number:[id number]   Exp Date:11/13/2010   Site Given  Left Deltoid IMu

## 2010-06-15 NOTE — Progress Notes (Signed)
  Phone Note Refill Request Message from:  Fax from Pharmacy on Oct 05, 2009 3:33 PM  Refills Requested: Medication #1:  PRAVACHOL 40 MG  TABS Take 1 tablet by mouth once a day Initial call taken by: Ami Bullins CMA,  Oct 05, 2009 3:33 PM    Prescriptions: PRAVACHOL 40 MG  TABS (PRAVASTATIN SODIUM) Take 1 tablet by mouth once a day  #30 x 2   Entered by:   Ami Bullins CMA   Authorized by:   Jacques Navy MD   Signed by:   Bill Salinas CMA on 10/05/2009   Method used:   Faxed to ...       [facility] (retail)       [address]       [place], [place]  [address]       Ph: [phone]       Fax: [phone]   RxID:   [id number]

## 2010-06-15 NOTE — Procedures (Signed)
Summary: Colonoscopy - Dr. Terrial Rhodes   Procedures Next Due Date:    Colonoscopy: 06/2008 Patient Name: Connor Dixon, Connor Dixon MRN:  Procedure Procedures: Colonoscopy CPT: 57846.  Personnel: Endoscopist: Ulyess Mort, MD.  Exam Location: Exam performed in Outpatient Clinic. Outpatient  Patient Consent: Procedure, Alternatives, Risks and Benefits discussed, consent obtained, from patient. Consent was obtained by the RN.  Indications  Average Risk Screening Routine.  History  Current Medications: Patient is not currently taking Coumadin.  Pre-Exam Physical: Entire physical exam was normal.  Exam Exam: Extent of exam reached: Cecum, extent intended: Cecum.  The cecum was identified by appendiceal orifice and IC valve. Colon retroflexion performed. Images were not taken. ASA Classification: II. Tolerance: good.  Monitoring: Pulse and BP monitoring, Oximetry used. Supplemental O2 given.  Sedation Meds: Patient assessed and found to be appropriate for moderate (conscious) sedation. Fentanyl 100 mcg. given IV. Versed 10 mg. given IV.  Findings - NOT SEEN ON EXAM: Cecum. Polyps, AVM's, Colitis, Tumors, Crohn's, Diverticulosis, Hemorrhoids, Comments: has moderate melanosis coli.   Assessment Normal examination.  Events  Unplanned Interventions: No intervention was required.  Unplanned Events: There were no complications. Plans Medication Plan: Continue current medications. Fiber supplements:  Anti-constipation:  reglan or zelnorm   Patient Education: Patient given standard instructions for: Constipation.a normal exam. Yearly hemoccult testing recommended. Patient instructed to get routine colonoscopy every 5 years.  Disposition: After procedure patient sent to recovery. After recovery patient sent home.    CC: Illene Regulus, MD  This report was created from the original endoscopy report, which was reviewed and signed by the above listed endoscopist.

## 2010-06-15 NOTE — Letter (Signed)
[facility] [address] [place], [place]  [address] Phone: [phone]      January 20, 2010   YORDY MATTON [address] [place], [place] [address]  RE:  LAB RESULTS  Dear  Mr. PERKO,  The following is an interpretation of your most recent lab tests.  Please take note of any instructions provided or changes to medications that have resulted from your lab work.  ELECTROLYTES:  Stable - no changes needed  KIDNEY FUNCTION TESTS:  Stable - no changes needed  LIPID PANEL:  Good - no changes needed Triglyceride: 56.0   Cholesterol: 145   LDL: 90   HDL: 43.60   Chol/HDL%:  3   DIABETIC STUDIES:  Poor - schedule a follow-up appointment soon Blood Glucose: 128   HgbA1C: 9.1   Microalbumin/Creatinine Ratio: 350.5     24 hr urine for creatinine clearance 49ml/min with normal being 75 plus. the total protein excretion at 270 was minimally elevated.  These results indicate early diabetic glomerulonephropathy - kidney damage from diabetes. Plan - conitnue to work to get better control of blood sugar.   Sincerely Yours,    Jacques Navy MD  Patient: Connor Dixon Note: All result statuses are Final unless otherwise noted.  Tests: (1) Renal Function Panel (RENAL)   Sodium                    141 mEq/L                   135-145   Potassium            [H]  5.3 mEq/L                   3.5-5.1   Chloride                  108 mEq/L                   96-112   Carbon Dioxide            25 mEq/L                    19-32   Calcium                   9.2 mg/dL                   0.2-54.2   Albumin                   4.0 g/dL                    7.0-6.2   BUN                  [H]  37 mg/dL                    3-76   Creatinine           [H]  1.6 mg/dL                   2.8-3.1   Glucose              [H]  128 mg/dL                   51-76   Phosphorus  3.5 mg/dL                   1.0-2.7   GFR                       55.57 mL/min                >60  Tests:  (2) Lipid Panel (LIPID)   Cholesterol               145 mg/dL                   2-536     ATP III Classification            Desirable:  < 200 mg/dL                    Borderline High:  200 - 239 mg/dL               High:  > = 240 mg/dL   Triglycerides             56.0 mg/dL                  6.4-403.4     Normal:  <150 mg/dL     Borderline High:  742 - 199 mg/dL   HDL                       59.56 mg/dL                 >38.75   VLDL Cholesterol          11.2 mg/dL                  6.4-33.2   LDL Cholesterol           90 mg/dL                    9-51  CHO/HDL Ratio:  CHD Risk                             3                    Men          Women     1/2 Average Risk     3.4          3.3     Average Risk          5.0          4.4     2X Average Risk          9.6          7.1     3X Average Risk          15.0          11.0                           Tests: (3) Hepatic/Liver Function Panel (HEPATIC)   Total Bilirubin           1.0 mg/dL                   8.8-4.1   Direct Bilirubin          0.2 mg/dL  0.0-0.3   Alkaline Phosphatase      93 U/L                      39-117   AST                       21 U/L                      0-37   ALT                       18 U/L                      0-53   Total Protein             7.0 g/dL                    1.6-1.0   Albumin                   4.0 g/dL                    9.6-0.4  Tests: (4) TSH (TSH)   FastTSH                   1.12 uIU/mL                 0.35-5.50  Tests: (5) Hemoglobin A1C (A1C)   Hemoglobin A1C       [H]  9.1 %                       4.6-6.5     Glycemic Control Guidelines for People with Diabetes:     Non Diabetic:  <6%     Goal of Therapy: <7%     Additional Action Suggested:  >8%   Tests: (6) Prostate Specific Antigen (PSA)   PSA-Hyb                   1.77 ng/mL                  0.10-4.00  Tests: (1) Creatinine Clearance (54098) ! Creatinine           [H]  1.65 mg/dL                  0.40-1.50 ! Total  Volume, Urine       1800 mL   Collection Interval       24 hours   Creatinine, Urine         78.8 mg/dL  Creatinine, 24 Hr Urine                             1418 mg/day                 [phone]   Creatinine Clearance [L]  60 mL/min                   75-125     To convert to Grams, divide mg by 1000  Tests: (2) Total Protein, Urine Timed (11914) ! Total Volume, Urine       1800 mL ! Collection Interval       24 hours   Protein, 24 Hr Urine [H]  270 mg/day  50-100 

## 2010-06-17 NOTE — Cardiovascular Report (Signed)
Summary: Office Visit   Office Visit   Imported By: Roderic Ovens 05/14/2010 16:17:54  _____________________________________________________________________  External Attachment:    Type:   Image     Comment:   External Document

## 2010-06-17 NOTE — Procedures (Signed)
Summary: [facility] per pt call/lg   Current Medications (verified): 1)  Furosemide 40 Mg  Tabs (Furosemide) .... Take 1 Tablet By Mouth Once A Day 2)  Pravachol 40 Mg  Tabs (Pravastatin Sodium) .... Take 1 Tablet By Mouth Once A Day 3)  Lantus 100 Unit/ml  Soln (Insulin Glargine) .... Inject 20 Units Under The Skin Each Night 4)  Vasotec 20 Mg  Tabs (Enalapril Maleate) .... 2 Once Daily 5)  Humulin 70/30 70-30 % Susp (Insulin Isophane & Regular) .... 65 Units 6)  Adult Aspirin Low Strength 81 Mg Tbdp (Aspirin) .... Once Daily 7)  Osteo Bi-Flex Adv Triple St  Tabs (Misc Natural Products) .... Once Daily 8)  Bd Insulin Syringe Microfine 28g X 1/2" 0.5 Ml Misc (Insulin Syringe-Needle U-100) .... Use As Directed To Inject Lantus and Humulin 9)  Freestyle Lite Test  Strp (Glucose Blood) .... Test Blood Glucose Three Times A Day (Dx 250.01)  Allergies (verified): No Known Drug Allergies  PPM Specifications Following MD:  Lewayne Bunting, MD     PPM Vendor:  St Jude     PPM Model Number:  [id number]     PPM Serial Number:  [id number] PPM DOI:  04/16/2010     PPM Implanting MD:  Lewayne Bunting, MD  Lead 1    Location: [facility]     DOI: 04/16/2010     Model #: [id number]     Serial #: [id number]     Status: active Lead 2    Location: RV     DOI: 04/16/2010     Model #: [id number]     Serial #: [id number]     Status: active  Magnet Response Rate:  BOL 100 ERI 85  Indications:  CHB   PPM Follow Up Battery Voltage:  3.02 V     Battery Est. Longevity:  5.9-7.1 yrs       PPM Device Measurements Atrium  Amplitude: 2.7 mV, Impedance: 410 ohms, Threshold: 0.5 V at 0.4 msec Right Ventricle  Amplitude: 8.6 mV, Impedance: 530 ohms, Threshold: 0.75 V at 0.4 msec  Episodes MS Episodes:  18     Percent Mode Switch:  <1%     Ventricular High Rate:  0     Atrial Pacing:  <1%     Ventricular Pacing:  >99%  Parameters Mode:  DDD     Lower Rate Limit:  60     Upper Rate Limit:  130 Paced AV Delay:  180     Sensed AV Delay:  160 Next  Cardiology Appt Due:  07/22/2010 Tech Comments:  WOUND CHECK---STERI STRIPS REMOVED BY PT.  NO REDNESS OR SWELLING AT SITE.  NORMAL DEVICE FUNCTION.  VP >99%---CHANGED PAV FROM 180 TO 250 AND SAV DELAY FROM 160 TO .  TURNED ON RATE RESPONSE DURING MODE SWITCH.  ROV 07-22-10 @ 845 W/GT. Vella Kohler  May 03, 2010 4:58 PM

## 2010-07-13 ENCOUNTER — Encounter: Payer: Self-pay | Admitting: Internal Medicine

## 2010-07-13 ENCOUNTER — Ambulatory Visit (INDEPENDENT_AMBULATORY_CARE_PROVIDER_SITE_OTHER): Payer: Medicare Other | Admitting: Internal Medicine

## 2010-07-13 DIAGNOSIS — I10 Essential (primary) hypertension: Secondary | ICD-10-CM

## 2010-07-13 DIAGNOSIS — I442 Atrioventricular block, complete: Secondary | ICD-10-CM

## 2010-07-13 DIAGNOSIS — E109 Type 1 diabetes mellitus without complications: Secondary | ICD-10-CM

## 2010-07-14 ENCOUNTER — Telehealth: Payer: Self-pay | Admitting: Internal Medicine

## 2010-07-19 ENCOUNTER — Telehealth: Payer: Self-pay | Admitting: Internal Medicine

## 2010-07-22 ENCOUNTER — Encounter (INDEPENDENT_AMBULATORY_CARE_PROVIDER_SITE_OTHER): Payer: Medicare Other | Admitting: Internal Medicine

## 2010-07-22 ENCOUNTER — Encounter: Payer: Self-pay | Admitting: Internal Medicine

## 2010-07-22 DIAGNOSIS — Z950 Presence of cardiac pacemaker: Secondary | ICD-10-CM

## 2010-07-22 DIAGNOSIS — I10 Essential (primary) hypertension: Secondary | ICD-10-CM

## 2010-07-22 DIAGNOSIS — I495 Sick sinus syndrome: Secondary | ICD-10-CM

## 2010-07-22 DIAGNOSIS — I442 Atrioventricular block, complete: Secondary | ICD-10-CM

## 2010-07-22 HISTORY — DX: Presence of cardiac pacemaker: Z95.0

## 2010-07-22 NOTE — Progress Notes (Signed)
Summary: refill  Phone Note Call from Patient Call back at [facility] Phone [phone]   Summary of Call: pt request insulin pens and needles, please call in [facility] Initial call taken by: Migdalia Dk,  July 14, 2010 1:57 PM    Prescriptions: BD INSULIN SYRINGE MICROFINE 28G X 1/2" 0.5 ML MISC (INSULIN SYRINGE-NEEDLE U-100) Use as directed to inject Lantus and Humulin  #120 x 2   Entered by:   Bill Salinas CMA   Authorized by:   Jacques Navy MD   Signed by:   Bill Salinas CMA on 07/15/2010   Method used:   Electronically to        [facility]* (retail)       [address]       [place], [place]  [address]       Ph: [phone]       Fax: [phone]   RxID:   [id number] LANTUS 100 UNIT/ML  SOLN (INSULIN GLARGINE) Inject 20 units under the skin each night  #10 x 12   Entered by:   Ami Bullins CMA   Authorized by:   Jacques Navy MD   Signed by:   Bill Salinas CMA on 07/15/2010   Method used:   Electronically to        [facility]* (retail)       [address]       [place], [place]  [address]       Ph: [phone]       Fax: [phone]   RxID:   [id number]

## 2010-07-22 NOTE — Assessment & Plan Note (Signed)
Summary: 6 MO ROV/NWS #   Vital Signs:  Patient profile:   76 year old male Height:      69 inches Weight:      258 pounds BMI:     38.24 O2 Sat:      96 % on Room air Temp:     97.8 degrees F oral Pulse rate:   74 / minute BP sitting:   132 / 86  (left arm) Cuff size:   large  Vitals Entered By: Bill Salinas CMA (July 13, 2010 11:25 AM)  O2 Flow:  Room air  Primary Care Provider:  Jacques Navy MD   History of Present Illness: Patient presents for follow-up: at last visit he was in complete heart block - was admitted urgently - had PTVDP implanted the next morning and has done very well since. He will still get a little shortness of breath with exertion.   Checked hospital records and he did have labs in December, obviating the need for repeat labs today.  He reports that he has been working on his diet and has lost 3 lbs. He continues to take all his medications as instructed.  Current Medications (verified): 1)  Furosemide 40 Mg  Tabs (Furosemide) .... Take 1 Tablet By Mouth Once A Day 2)  Pravachol 40 Mg  Tabs (Pravastatin Sodium) .... Take 1 Tablet By Mouth Once A Day 3)  Lantus 100 Unit/ml  Soln (Insulin Glargine) .... Inject 20 Units Under The Skin Each Night 4)  Vasotec 20 Mg  Tabs (Enalapril Maleate) .... 2 Once Daily 5)  Humulin 70/30 70-30 % Susp (Insulin Isophane & Regular) .... 65 Units 6)  Adult Aspirin Low Strength 81 Mg Tbdp (Aspirin) .... Once Daily 7)  Osteo Bi-Flex Adv Triple St  Tabs (Misc Natural Products) .... Once Daily 8)  Bd Insulin Syringe Microfine 28g X 1/2" 0.5 Ml Misc (Insulin Syringe-Needle U-100) .... Use As Directed To Inject Lantus and Humulin 9)  Freestyle Lite Test  Strp (Glucose Blood) .... Test Blood Glucose Three Times A Day (Dx 250.01)  Allergies (verified): No Known Drug Allergies  Past History:  Past Medical History: Current Problems:  ATRIOVENTRICULAR BLOCK, 3RD DEGREE (ICD-426.0) HYPOPOTASSEMIA (ICD-276.8) ANGINA,  ATYPICAL (ICD-413.9) CHRONIC KIDNEY DISEASE STAGE II (MILD) (ICD-585.2) ARTHUS PHENOMENON (ICD-995.21) MORBID OBESITY (ICD-278.01) ESSENTIAL HYPERTENSION (ICD-401.9) HYPERLIPIDEMIA (ICD-272.4) DIABETES MELLITUS, TYPE I, ADULT ONSET (ICD-250.01)  Past Surgical History: Total hip replacement (04/05/2006)-right Cataract extraction - August and September '08 Dagoberto Ligas) PTVDP - implanted Dec '11 FH reviewed for relevance, SH/Risk Factors reviewed for relevance  Review of Systems  The patient denies anorexia, fever, weight loss, weight gain, decreased hearing, chest pain, dyspnea on exertion, prolonged cough, abdominal pain, severe indigestion/heartburn, muscle weakness, difficulty walking, abnormal bleeding, and angioedema.    Physical Exam  General:  obese,alert, well-developed, and well-hydrated AA male.   Head:  normocephalic and atraumatic.   Eyes:  pupils equal and pupils round.   Neck:  supple, full ROM, and no thyromegaly.   Chest Wall:  left anterior chest with device implanted: no erythema or tenderness. Breasts:  gynecomastia.   Lungs:  normal respiratory effort and normal breath sounds.   Heart:  normal rate and regular rhythm.   Abdomen:  obese with panniculus, soft, non-tender, and no guarding.   Msk:  no joint tenderness and no joint swelling.   Pulses:  2+ radial Neurologic:  alert & oriented X3, cranial nerves II-XII intact, and gait normal.   Skin:  turgor normal,  color normal, and no ecchymoses.   Cervical Nodes:  no anterior cervical adenopathy and no posterior cervical adenopathy.   Psych:  Oriented X3, memory intact for recent and remote, and normally interactive.     Impression & Recommendations:  Problem # 1:  ATRIOVENTRICULAR BLOCK, 3RD DEGREE (ICD-426.0) Doing very well. Pacer site looks fine.  Plan - keep appointment with Dr. Ladona Ridgel for follow-up  Problem # 2:  MORBID OBESITY (ICD-278.01) He has lost 3 lbs. He is encouraged to continue slow steady  weight loss.  Problem # 3:  ESSENTIAL HYPERTENSION (ICD-401.9)  His updated medication list for this problem includes:    Furosemide 40 Mg Tabs (Furosemide) .Marland Kitchen... Take 1 tablet by mouth once a day    Vasotec 20 Mg Tabs (Enalapril maleate) .Marland Kitchen... 2 once daily  BP today: 132/86 Prior BP: 102/52 (04/15/2010)  Labs Reviewed: K+: 4.8 (04/26/2010) Creat: : 1.6 (04/26/2010)    Reasonable control. Continue present meds. Continue weight managment program.  Problem # 4:  DIABETES MELLITUS, TYPE I, ADULT ONSET (ICD-250.01)  His updated medication list for this problem includes:    Lantus 100 Unit/ml Soln (Insulin glargine) ..... Inject 20 units under the skin each night    Vasotec 20 Mg Tabs (Enalapril maleate) .Marland Kitchen... 2 once daily    Humulin 70/30 70-30 % Susp (Insulin isophane & regular) .Marland KitchenMarland KitchenMarland KitchenMarland Kitchen 65 units    Adult Aspirin Low Strength 81 Mg Tbdp (Aspirin) ..... Once daily  Labs Reviewed: Creat: 1.6 (04/26/2010)     Last Eye Exam: diabetic retinopathy (12/31/2009) Reviewed HgBA1c results: 9.1 (01/06/2010)  8.4 (08/29/2008)  Last A1C in hospital was less than 8%. He will continue on his present medical regimen and will continue to work the weight managment  program.  Problem # 5:  Preventive Health Care (ICD-V70.0) He is provided with an Rx for cipro for his travel kit - going to [place] for 3 weeks!  Complete Medication List: 1)  Furosemide 40 Mg Tabs (Furosemide) .... Take 1 tablet by mouth once a day 2)  Pravachol 40 Mg Tabs (Pravastatin sodium) .... Take 1 tablet by mouth once a day 3)  Lantus 100 Unit/ml Soln (Insulin glargine) .... Inject 20 units under the skin each night 4)  Vasotec 20 Mg Tabs (Enalapril maleate) .... 2 once daily 5)  Humulin 70/30 70-30 % Susp (Insulin isophane & regular) .... 65 units 6)  Adult Aspirin Low Strength 81 Mg Tbdp (Aspirin) .... Once daily 7)  Osteo Bi-flex Adv Triple St Tabs (Misc natural products) .... Once daily 8)  Bd Insulin Syringe Microfine 28g  X 1/2" 0.5 Ml Misc (Insulin syringe-needle u-100) .... Use as directed to inject lantus and humulin 9)  Freestyle Lite Test Strp (Glucose blood) .... Test blood glucose five times a day (dx 250.01): before bkfst, after each meal and at bedtime-labile glucose levels 10)  Ciprofloxacin Hcl 500 Mg Tabs (Ciprofloxacin hcl) .Marland Kitchen.. 1 two times a day x 5 days for travelers diarrhea, x 7 days for respiratory or other infection Prescriptions: CIPROFLOXACIN HCL 500 MG TABS (CIPROFLOXACIN HCL) 1 two times a day x 5 days for travelers diarrhea, x 7 days for respiratory or other infection  #28 x 0   Entered and Authorized by:   Jacques Navy MD   Signed by:   Jacques Navy MD on 07/13/2010   Method used:   Electronically to        [facility]* (retail)       [address]       [place], [place]  [address]       Ph: [phone]       Fax: [phone]   RxID:   [id number] FREESTYLE LITE TEST  STRP (GLUCOSE BLOOD) Test Blood glucose five times a day (Dx 250.01): before bkfst, after each meal and at bedtime-labile glucose levels  #105 strips x 12   Entered and Authorized by:   Jacques Navy MD   Signed by:   Jacques Navy MD on 07/13/2010   Method used:   Electronically to        [facility] (retail)       [address]       [place], [place]  [id number]       Ph: [phone]       Fax: [phone]   RxID:   [id number]    Orders Added: 1)  Est. Patient Level III [[address]]

## 2010-07-26 LAB — BASIC METABOLIC PANEL
BUN: 45 mg/dL — ABNORMAL HIGH (ref 6–23)
CO2: 19 mEq/L (ref 19–32)
Calcium: 8.6 mg/dL (ref 8.4–10.5)
GFR calc non Af Amer: 36 mL/min — ABNORMAL LOW (ref >60)
Glucose, Bld: 305 mg/dL — ABNORMAL HIGH (ref 70–99)
Potassium: 5.3 mEq/L — ABNORMAL HIGH (ref 3.5–5.1)

## 2010-07-26 LAB — LIPID PANEL
HDL: 40 mg/dL (ref >39)
LDL Cholesterol: 88 mg/dL (ref 0–99)
Triglycerides: 49 mg/dL (ref <150)
VLDL: 10 mg/dL (ref 0–40)

## 2010-07-26 LAB — GLUCOSE, CAPILLARY
Glucose-Capillary: 120 mg/dL — ABNORMAL HIGH (ref 70–99)
Glucose-Capillary: 166 mg/dL — ABNORMAL HIGH (ref 70–99)
Glucose-Capillary: 173 mg/dL — ABNORMAL HIGH (ref 70–99)
Glucose-Capillary: 241 mg/dL — ABNORMAL HIGH (ref 70–99)
Glucose-Capillary: 274 mg/dL — ABNORMAL HIGH (ref 70–99)
Glucose-Capillary: 275 mg/dL — ABNORMAL HIGH (ref 70–99)
Glucose-Capillary: 389 mg/dL — ABNORMAL HIGH (ref 70–99)

## 2010-07-26 LAB — CBC
Hemoglobin: 14.1 g/dL (ref 13.0–17.0)
MCH: 31 pg (ref 26.0–34.0)
MCHC: 33.9 g/dL (ref 30.0–36.0)
MCV: 91.4 fL (ref 78.0–100.0)
Platelets: 148 10*3/uL — ABNORMAL LOW (ref 150–400)
RBC: 4.55 MIL/uL (ref 4.22–5.81)

## 2010-07-26 LAB — CARDIAC PANEL(CRET KIN+CKTOT+MB+TROPI)
CK, MB: 2.2 ng/mL (ref 0.3–4.0)
CK, MB: 2.2 ng/mL (ref 0.3–4.0)
CK, MB: 2.4 ng/mL (ref 0.3–4.0)
Relative Index: 1.5 (ref 0.0–2.5)
Total CK: 151 U/L (ref 7–232)
Troponin I: 0.02 ng/mL (ref 0.00–0.06)
Troponin I: 0.04 ng/mL (ref 0.00–0.06)
Troponin I: 0.04 ng/mL (ref 0.00–0.06)

## 2010-07-26 LAB — COMPREHENSIVE METABOLIC PANEL
ALT: 38 U/L (ref 0–53)
BUN: 62 mg/dL — ABNORMAL HIGH (ref 6–23)
Calcium: 8.5 mg/dL (ref 8.4–10.5)
Creatinine, Ser: 2.38 mg/dL — ABNORMAL HIGH (ref 0.4–1.5)
Glucose, Bld: 208 mg/dL — ABNORMAL HIGH (ref 70–99)
Sodium: 137 mEq/L (ref 135–145)
Total Protein: 6 g/dL (ref 6.0–8.3)

## 2010-07-26 LAB — HEMOGLOBIN A1C: Hgb A1c MFr Bld: 6.7 % — ABNORMAL HIGH (ref <5.7)

## 2010-07-26 LAB — MRSA PCR SCREENING: MRSA by PCR: NEGATIVE

## 2010-07-26 LAB — TSH: TSH: 1.107 u[IU]/mL (ref 0.350–4.500)

## 2010-07-27 NOTE — Progress Notes (Signed)
  Phone Note From Pharmacy   Summary of Call: pt would like prescription of the humulin 70/30 pen for when he goes on vacation.  Initial call taken by: Ami Bullins CMA,  July 19, 2010 3:13 PM    New/Updated Medications: HUMULIN 70/30 PEN 70-30 % SUSP (INSULIN ISOPHANE & REGULAR) inject 65 units as directed PEN NEEDLES 5/16" 31G X 8 MM MISC (INSULIN PEN NEEDLE) 1 daily Prescriptions: PEN NEEDLES 5/16" 31G X 8 MM MISC (INSULIN PEN NEEDLE) 1 daily  #30 x 3   Entered by:   Ami Bullins CMA   Authorized by:   Jacques Navy MD   Signed by:   Bill Salinas CMA on 07/19/2010   Method used:   Electronically to        [facility]* (retail)       [address]       [place], [place]  [address]       Ph: [phone]       Fax: [phone]   RxID:   [id number] HUMULIN 70/30 PEN 70-30 % SUSP (INSULIN ISOPHANE & REGULAR) inject 65 units as directed  #1 pen x 4   Entered by:   Ami Bullins CMA   Authorized by:   Jacques Navy MD   Signed by:   Bill Salinas CMA on 07/19/2010   Method used:   Electronically to        [facility] (retail)       [address]       [place], [place]  [address]       Ph: [phone]       Fax: [phone]   RxID:   [id number]

## 2010-07-27 NOTE — Assessment & Plan Note (Signed)
Summary: eph/3 months/mt per after hours   Visit Type:  Initial Consult Primary Provider:  Jacques Navy MD   History of Present Illness: Connor Dixon returns today for followup. He is a pleasant 76 yo man with HTN who developed CHB and underwent PPM in 12/11. He has done well since except for some pain in his right hip (prior hip replacement).  The patient denies c/p, sob, or peripheral edema. No other complaints today.   Current Medications (verified): 1)  Furosemide 40 Mg  Tabs (Furosemide) .... Take 1 Tablet By Mouth Once A Day 2)  Pravachol 40 Mg  Tabs (Pravastatin Sodium) .... Take 1 Tablet By Mouth Once A Day 3)  Lantus 100 Unit/ml  Soln (Insulin Glargine) .... Inject 20 Units Under The Skin Each Night 4)  Vasotec 20 Mg  Tabs (Enalapril Maleate) .... 2 Once Daily 5)  Adult Aspirin Low Strength 81 Mg Tbdp (Aspirin) .... Once Daily 6)  Osteo Bi-Flex Adv Triple St  Tabs (Misc Natural Products) .... Once Daily 7)  Bd Insulin Syringe Microfine 28g X 1/2" 0.5 Ml Misc (Insulin Syringe-Needle U-100) .... Use As Directed To Inject Lantus and Humulin 8)  Freestyle Lite Test  Strp (Glucose Blood) .... Test Blood Glucose Five Times A Day (Dx 250.01): Before Bkfst, After Each Meal and At Bedtime-Labile Glucose Levels 9)  Humulin 70/30 Pen 70-30 % Susp (Insulin Isophane & Regular) .... Inject 65 Units As Directed 10)  Pen Needles 5/16" 31g X 8 Mm Misc (Insulin Pen Needle) .Marland Kitchen.. 1 Daily  Allergies (verified): No Known Drug Allergies  Past History:  Past Medical History: Last updated: 07/13/2010 Current Problems:  ATRIOVENTRICULAR BLOCK, 3RD DEGREE (ICD-426.0) HYPOPOTASSEMIA (ICD-276.8) ANGINA, ATYPICAL (ICD-413.9) CHRONIC KIDNEY DISEASE STAGE II (MILD) (ICD-585.2) ARTHUS PHENOMENON (ICD-995.21) MORBID OBESITY (ICD-278.01) ESSENTIAL HYPERTENSION (ICD-401.9) HYPERLIPIDEMIA (ICD-272.4) DIABETES MELLITUS, TYPE I, ADULT ONSET (ICD-250.01)  Past Surgical History: Last updated:  07/13/2010 Total hip replacement (04/05/2006)-right Cataract extraction - August and September '08 Connor Dixon) PTVDP - implanted Dec '11  Review of Systems  The patient denies chest pain, syncope, dyspnea on exertion, and peripheral edema.    Vital Signs:  Patient profile:   75 year old male Height:      69 inches Weight:      259 pounds BMI:     38.39 Pulse rate:   79 / minute BP sitting:   134 / 72  (right arm)  Vitals Entered By: Laurance Flatten CMA (July 22, 2010 8:47 AM)  Physical Exam  General:  obese,alert, well-developed, and well-hydrated AA male.   Head:  normocephalic and atraumatic.   Eyes:  pupils equal and pupils round.   Mouth:  Oral mucosa and oropharynx without lesions or exudates.  Teeth in good repair. Neck:  supple, full ROM, and no thyromegaly.   Chest Wall:  left anterior chest with device implanted: no erythema or tenderness. Lungs:  normal respiratory effort and normal breath sounds.   Heart:  normal rate and regular rhythm.   Abdomen:  obese with panniculus, soft, non-tender, and no guarding.   Msk:  no joint tenderness and no joint swelling.   Pulses:  2+ radial Extremities:  no edema or deformity Neurologic:  alert & oriented X3, cranial nerves II-XII intact, and gait normal.     PPM Specifications Following MD:  Lewayne Bunting, MD     PPM Vendor:  St Jude     PPM Model Number:  [id number]     PPM Serial Number:  [id number]  PPM DOI:  04/16/2010     PPM Implanting MD:  Lewayne Bunting, MD  Lead 1    Location: RA     DOI: 04/16/2010     Model #: [id number]     Serial #: [id number]     Status: active Lead 2    Location: RV     DOI: 04/16/2010     Model #: [id number]     Serial #: [id number]     Status: active  Magnet Response Rate:  BOL 100 ERI 85  Indications:  CHB   Parameters Mode:  DDD     Lower Rate Limit:  60     Upper Rate Limit:  130 Paced AV Delay:  180     Sensed AV Delay:  160 MD Comments:  Agree with above.  Impression & Recommendations:  Problem  # 1:  CARDIAC PACEMAKER IN SITU (ICD-V45.01) His device is working normally. Will recheck in several months.  Problem # 2:  ESSENTIAL HYPERTENSION (ICD-401.9) His blood pressure is well controlled today. Continue current meds and maintain a low sodium diet. His updated medication list for this problem includes:    Furosemide 40 Mg Tabs (Furosemide) .Marland Kitchen... Take 1 tablet by mouth once a day    Vasotec 20 Mg Tabs (Enalapril maleate) .Marland Kitchen... 2 once daily    Adult Aspirin Low Strength 81 Mg Tbdp (Aspirin) ..... Once daily  Patient Instructions: 1)  Your physician wants you to follow-up in:  Dec 2012 with Dr Court Joy will receive a reminder letter in the mail two months in advance. If you don't receive a letter, please call our office to schedule the follow-up appointment. 2)  Your physician recommends that you continue on your current medications as directed. Please refer to the Current Medication list given to you today.

## 2010-07-31 LAB — GLUCOSE, CAPILLARY: Glucose-Capillary: 200 mg/dL — ABNORMAL HIGH (ref 70–99)

## 2010-08-03 NOTE — Cardiovascular Report (Signed)
Summary: Office Visit   Office Visit   Imported By: Roderic Ovens 07/28/2010 11:56:05  _____________________________________________________________________  External Attachment:    Type:   Image     Comment:   External Document

## 2010-08-09 ENCOUNTER — Telehealth: Payer: Self-pay | Admitting: *Deleted

## 2010-08-09 MED ORDER — INSULIN GLARGINE 100 UNIT/ML ~~LOC~~ SOLN: 20.0000 [IU] | Freq: Every day | SUBCUTANEOUS | Status: DC

## 2010-08-09 NOTE — Telephone Encounter (Signed)
Needs RF, done, Patient informed  

## 2010-10-01 NOTE — Op Note (Signed)
NAME:  Connor Dixon, Connor Dixon NO.:  [id number]   MEDICAL RECORD NO.:  [id number]          PATIENT TYPE:  INP   LOCATION:  2899                         FACILITY:  [facility]   PHYSICIAN:  Elana Alm. Thurston Hole, M.D. DATE OF BIRTH:  02/27/1935   DATE OF PROCEDURE:  03/27/2006  DATE OF DISCHARGE:                               OPERATIVE REPORT   PREOPERATIVE DIAGNOSIS:  Right hip DJD - avascular necrosis.   POSTOPERATIVE DIAGNOSIS:  Right hip DJD - avascular necrosis.   PROCEDURE:  Right total hip replacement using DePuy - S-ROM Press-Fit  total hip system with acetabulum 58 mm, Press-Fit ASR acetabular cup  with femoral component, 20 x 15 femoral stem with 36 +8 femoral neck  offset with 20 D small sleeve with 51 mm +6 ASR femoral head.   SURGEON:  Elana Alm. Thurston Hole, M.D.   ASSISTANT:  Julien Girt, P.A.   ANESTHESIA:  General.   OPERATIVE TIME:  1 hour 30 minutes.   ESTIMATED BLOOD LOSS:  400 mL.   COMPLICATIONS:  None.   DESCRIPTION OF PROCEDURE:  Mr. Pennella was brought to operating room on  03/27/2006, placed operating table in supine position.  After adequate  level general anesthesia was obtained, he had a Foley catheter placed  under sterile conditions.  He received Ancef 2 grams IV preoperatively  for prophylaxis.  His right hip was examined.  He had flexion to 90,  extension 0, internal external rotation of 30 degrees.  Leg lengths were  approximately equal.  After being placed under general anesthesia, he  was turned the right lateral up decubitus position and secured on the  bed with a Mark frame.  His right hip and leg was prepped using sterile  DuraPrep and draped using sterile technique.  Originally through a 15 cm  posterolateral greater trochanteric incision initial exposure was made.  Underlying subcutaneous tissues were incised in line with the skin  incision.  Iliotibial band and gluteus maximus fascia was incised  longitudinally revealing the  underlying sciatic nerve which was  carefully protected.  The short external rotators of the hip and hip  capsule were released off the femoral neck insertion and tagged and then  the hip was posteriorly dislocated.  He was found to have complete  collapse of the femoral head with a grade 3 and 4 DJD.  A femoral neck  cut was made 2.5 cm above the lesser trochanter.  Sequential retractors  were carefully placed around the acetabulum and then degenerative  acetabular labrum was removed.  Acetabulum was found to have grade 3 and  4 DJD as well.  Sequential acetabular reaming was then carried out up to  a 57 mm size in the appropriate manner anteversion, abduction and  inclination and then a 58 mm ASR trial was hammered into position with  an excellent fit.  It was then removed and the actual 58 mm ASR  acetabular component was hammered into position in the appropriate  amount of anteversion, abduction and inclination with an excellent fit.  The proximal femur was exposed.  The distal femur was  then reamed up to  a 15.5 size proximally, the proximal femur was reamed to a 20 D size and  then a calcar reamer was used to ream up to a small sleeve.  With the 20  D small sleeve trial in place and the 20 x 15 femoral trial with a 36 +8  offset a 51 mm ASR head with a +6 femoral neck was placed onto the  femoral component.  The hip reduced, taken through full range of motion,  found to be stable.  Leg lengths equalized.  It was felt that this trial  gave excellent stability and excellent restoration of leg length.  The  femoral trials were then removed.  The femoral canal was further  irrigated with saline and the actual 20 D small sleeve was hammered into  position with an excellent fit and then the 20 x 15 femoral component  with a 36 +8 offset was hammered into position through the femoral  sleeve with an excellent fit and then the 51 mm ASR with a +6 femoral  neck was placed on the femoral  component and with an excellent Morse  taper fit, the hip was then reduced, taken through full range of motion,  found to be stable up to 80 degrees of internal rotation in both neutral  and 30 degrees of adduction, also stable in abduction, external rotation  and leg lengths equalized.  At this point it was felt that all  components were excellent size, fit and stability.  The wound was  further irrigated.  The short external rotators of the hip and hip  capsule were then reattached to their femoral neck insertion through two  drill holes on the greater trochanter.  The iliotibial band and gluteus  maximus fascia was closed with #1 Ethibond suture.  Subcutaneous tissues  closed with 0 and 2-0 Vicryl, subcuticular layer closed with 4-0  Monocryl.  Steri-Strips were applied.  Sterile dressings were applied.  Knee immobilizer applied.  The patient turned supine and checked for leg  lengths equal, rotation equal, pulses 2+ and symmetric.  He was  awakened, extubated and taken to recovery in stable condition.  Needle,  sponge counts correct x2 end the case.      Robert A. Thurston Hole, M.D.  Electronically Signed     RAW/MEDQ  D:  03/27/2006  T:  03/27/2006  Job:  [id number]

## 2010-10-01 NOTE — Assessment & Plan Note (Signed)
[facility]                             PRIMARY CARE OFFICE NOTE   JARON, CZARNECKI                       MRN:          [id number]  DATE:02/27/2006                            DOB:          1935-02-18    Connor Dixon is a 76 year old African-American gentleman who presents for  surgical clearance for total hip replacement.  The patient reports that the  pain has become unbearable, significantly limiting his activity.  He is  scheduled for early November for a left THR.   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   MEDICAL:     1. Usual childhood disease.     2. Hypertension.     3. Diabetes, insulin dependent.     4. Diabetic neuropathy.     5. Hyperlipidemia.     6. Obesity.   CURRENT MEDICATIONS:     1. Insulin 70/30 30 units q. a.m. 25 units q. p.m.     2. Vasotec 20 mg daily.     3. Lasix 40 mg daily.     4. Pravachol 40 mg daily.     5. Lantus 20 units nightly.     6. Vicodin 5/750 q.4 hours p.r.n.   FAMILY HISTORY:  Father diabetic.  Mother lived to be 51.  There is a  positive family history for cardiovascular disease.   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  He has been  doing lots of traveling in his retirement.  His wife is in good health.   REVIEW OF SYSTEMS:  The patient has had no constitutional, cardiovascular,  respiratory, or GI problems.   CHART REVIEW:  Last colonoscopy June 20, 2003, which was unremarkable.  Last Cardiolite study April 16, 2001, which was clinographically negative  for myocardial ischemia or infarction with a normal ejection fraction.  Last  chest x-ray from July 10, 1997 was unremarkable.   EXAMINATION:  Temperature was 99.7, blood pressure 159/69, pulse 100, weight  266.  GENERAL APPEARANCE:  This is an overweight African-American gentleman in no  acute distress.  HEENT:  Normocephalic, atraumatic.  ACs and TMs  were normal.  Oropharynx was  clear.  Conjunctivae and sclerae were clear.  Pupils are equal, round, and  reactive.  NECK:  Supple without thyromegaly.  No lymphadenopathy was noted in the  cervical or supraclavicular regions.  CHEST:  No CVA tenderness.  The patient with mild gynecomastia secondary to  obesity.  LUNGS:  Clear with no rales, wheezes, or rhonchi.  CARDIOVASCULAR:  2+ radial pulses.  No JVD or carotid bruits.  He had a  quiet precordium with regular rate and rhythm with no murmurs, rubs, or  gallops.  ABDOMEN:  Obese with no guarding or rebound.  Palpation hindered by size.  Genitalia and rectal exams deferred.  EXTREMITIES:  Without clubbing, cyanosis.  No edema was noted.  NEUROLOGIC:  The patient is awake, alert, and oriented to person, place,  time and context.  No further examination conducted.   DATABASE:  A  hemoglobin was 13.3 g, white count Q was, 6,400 with 13.2%  monocytes.  Chemistries revealed normal electrolytes.  Glucose was 117,  creatinine 1.5, GFR 60 ml per minute.  Liver functions were normal.  Cholesterol 123, triglyceride 64, HDL 41.2, LDL 69.  Hemoglobin A1C was  7.2%.   A 12-lead electrocardiogram revealed a normal sinus rhythm and a normal EKG  with no signs of strain or ischemic change.   ASSESSMENT AND PLAN:  1. Diabetes.  The patient's regimen is working well.  His hemoglobin A1C      is 7.2%, which is excellent.  Plan:  The patient to continue his      present regimen and to improve his diet and exercise as able.  2. Hyperlipidemia.  The patient's cholesterol levels are excellent and at      goal.  He tolerated Pravachol well.  He will continue the same.  3. Hypertension.  The patient is mildly hypertensive in the office.      Suspect this is exacerbated by his weight gain.  Plan:  Will have the      patient continue on his present medications, Vasotec and Lasix.  Will      have him try to lose weight and exercise more after his hip  surgery.  4. Orthopedics.  The patient with severe degenerative joint disease      affecting his hip for total hip replacement.  The patient's surgical      risk is slightly increased because of his diabetes and obesity.  The      patient will require perioperative sliding scale management.  There is      no cardiovascular contraindication to surgery.  No need at this time      for repeat testing given the patient is free of symptoms, normal EKG is      present, and recent stress study was negative.   SUMMARY:  A pleasant gentleman who is medically cleared for surgery for  total hip replacement.  [place] [facility] will be happy to follow  the patient during his hospital stay.            ______________________________  Rosalyn Gess. Norins, MD   MEN/MedQ DD:  02/28/2006 DT:  02/28/2006 Job #:  [id number]

## 2010-10-01 NOTE — Discharge Summary (Signed)
NAME:  ZARIN, KNUPP NO.:  [id number]   MEDICAL RECORD NO.:  [id number]          PATIENT TYPE:  INP   LOCATION:  5041                         FACILITY:  [facility]   PHYSICIAN:  Elana Alm. Thurston Hole, M.D. DATE OF BIRTH:  1934/12/19   DATE OF ADMISSION:  03/27/2006  DATE OF DISCHARGE:  03/30/2006                               DISCHARGE SUMMARY   ADMISSION DIAGNOSIS:  1. End stage degenerative joint disease, right hip.  2. Diabetes.  3. Hypertension.   DISCHARGE DIAGNOSIS:  1. End stage degenerative joint disease, right hip.  2. Postoperative blood loss anemia.  3. Atelectasis.  4. Status post traumatic finger amputation left index finger.  5. Obesity.  6. Acute on chronic renal insufficiency.  7. Tachycardia.   HISTORY OF PRESENT ILLNESS:  The patient is a 76 year old white male  with a history of end stage DJD of his right hip.  He has failed  conservative care including anti-inflammatories, intra-articular  cortisone injections, and physical therapy. At this point in time, he  has pain at night, pain with rest, pain with activity, and is  essentially wheelchair bound due to his pain.  He understands the risks,  benefits, and possible complications of a right total hip replacement  and is without question.   PROCEDURES IN HOUSE:  On March 27, 2006, the patient underwent a  right total hip replacement by Dr. Thurston Hole.  He tolerated the procedure  well and was admitted postoperatively for pain control, physical therapy  and DVT prophylaxis.   HOSPITAL COURSE:  On postop day one, T-max 99.2, hemoglobin was 10.3.  His potassium was high at 5.3.  Renal function was diminished with a BUN  of 31 and a creatinine of 1.6.  A 500 mL bolus of normal saline was  ordered to be given at 7:00 a.m. and repeated 1:00 p.m.  IV fluids were  switched from normal saline with 20K to just normal saline.  His  dressing was changed.  His PCA was discontinued.  He was up with  physical therapy and progressing slowly.  Postop day two, T-max of  100.8, hemoglobin was 10.  Renal function was improved with a BUN of 22  and a creatinine of 1.3.  Potassium was improved at 5.1.  The surgical  wound was well approximated.  Medicine was consulted due to his renal  insufficiency and uncontrolled diabetes with his sugars in the 200s  despite sliding scale insulin and his regular meds.  Postop day three,  hemoglobin was 9.6, INR was 2.1.  Renal function was improved with a BUN  of 22 and creatinine of 1.2. Potassium was improved to 4.1. CBGs were  improved at 94.  Due to his chest pain and unilateral atelectasis, a  spiral CT was ordered to rule out a PE.  This came back negative.  Therefore, he was discharged to home in stable condition on 50% weight  bearing, instructions on how to use his sliding scale insulin and adjust  it accordingly.   DISCHARGE MEDICATIONS:  Oxy-IR 5 mg 1-2 tablets q.4-6h. p.r.n. pain,  Coumadin 5  mg 1/2 tablet Monday, Wednesday, and Friday, with a whole  tablet Tuesday, Thursday, Saturday, and Sunday, Lasix 40 mg daily,  Pravachol 40 mg daily, Vasotec 20 mg daily, insulin 70/30, 35 units in  the morning and 30 units in the evening, Lantus 20 units at night,  Keflex 500 mg one p.o. q.i.d., Colace 100 mg 1 tablet twice a day,  Senokot-S 2 tablets with dinner if no BM in ten days.   DISCHARGE INSTRUCTIONS:  He has been instructed to change his dressing  daily.  Call with increased redness, increased swelling, increased  drainage or a temperature greater than 101.  He will follow up with Dr.  Debby Bud this week.  He will follow up with Dr. Wyline Mood on April 10, 2006.  He is getting home health PT and OT as well as nursing to monitor  his Coumadin.      Kirstin Shepperson, P.A.      Robert A. Thurston Hole, M.D.  Electronically Signed    KS/MEDQ  D:  04/19/2006  T:  04/19/2006  Job:  [id number]

## 2010-11-16 ENCOUNTER — Encounter: Payer: Self-pay | Admitting: Internal Medicine

## 2011-01-06 ENCOUNTER — Encounter: Payer: Self-pay | Admitting: Internal Medicine

## 2011-01-07 ENCOUNTER — Other Ambulatory Visit (INDEPENDENT_AMBULATORY_CARE_PROVIDER_SITE_OTHER): Payer: Medicare Other

## 2011-01-07 ENCOUNTER — Encounter: Payer: Self-pay | Admitting: Internal Medicine

## 2011-01-07 ENCOUNTER — Other Ambulatory Visit: Payer: Self-pay | Admitting: *Deleted

## 2011-01-07 ENCOUNTER — Ambulatory Visit (INDEPENDENT_AMBULATORY_CARE_PROVIDER_SITE_OTHER): Payer: Medicare Other | Admitting: Internal Medicine

## 2011-01-07 ENCOUNTER — Telehealth: Payer: Self-pay

## 2011-01-07 VITALS — BP 132/80 | HR 77 | Temp 97.3°F | Ht 68.0 in | Wt 261.0 lb

## 2011-01-07 DIAGNOSIS — E785 Hyperlipidemia, unspecified: Secondary | ICD-10-CM

## 2011-01-07 DIAGNOSIS — I10 Essential (primary) hypertension: Secondary | ICD-10-CM

## 2011-01-07 DIAGNOSIS — E109 Type 1 diabetes mellitus without complications: Secondary | ICD-10-CM

## 2011-01-07 DIAGNOSIS — Z0000 Encounter for general adult medical examination without abnormal findings: Secondary | ICD-10-CM

## 2011-01-07 DIAGNOSIS — N182 Chronic kidney disease, stage 2 (mild): Secondary | ICD-10-CM

## 2011-01-07 DIAGNOSIS — I442 Atrioventricular block, complete: Secondary | ICD-10-CM

## 2011-01-07 LAB — COMPREHENSIVE METABOLIC PANEL
ALT: 13 U/L (ref 0–53)
AST: 26 U/L (ref 0–37)
Albumin: 4.1 g/dL (ref 3.5–5.2)
Alkaline Phosphatase: 89 U/L (ref 39–117)
BUN: 39 mg/dL — ABNORMAL HIGH (ref 6–23)
CO2: 25 mEq/L (ref 19–32)
Calcium: 9.3 mg/dL (ref 8.4–10.5)
Chloride: 108 mEq/L (ref 96–112)
Creatinine, Ser: 1.6 mg/dL — ABNORMAL HIGH (ref 0.4–1.5)
GFR: 54.62 mL/min — ABNORMAL LOW (ref >60.00)
Glucose, Bld: 140 mg/dL — ABNORMAL HIGH (ref 70–99)
Potassium: 5.8 mEq/L — ABNORMAL HIGH (ref 3.5–5.1)
Sodium: 140 mEq/L (ref 135–145)
Total Bilirubin: 1.3 mg/dL — ABNORMAL HIGH (ref 0.3–1.2)
Total Protein: 7.1 g/dL (ref 6.0–8.3)

## 2011-01-07 LAB — LIPID PANEL
Cholesterol: 174 mg/dL (ref 0–200)
LDL Cholesterol: 114 mg/dL — ABNORMAL HIGH (ref 0–99)
Triglycerides: 44 mg/dL (ref 0.0–149.0)
VLDL: 8.8 mg/dL (ref 0.0–40.0)

## 2011-01-07 LAB — CBC WITH DIFFERENTIAL/PLATELET
Basophils Absolute: 0 10*3/uL (ref 0.0–0.1)
Basophils Relative: 0.4 % (ref 0.0–3.0)
Eosinophils Absolute: 0.2 10*3/uL (ref 0.0–0.7)
Eosinophils Relative: 3.1 % (ref 0.0–5.0)
HCT: 46.3 % (ref 39.0–52.0)
Hemoglobin: 15.4 g/dL (ref 13.0–17.0)
Lymphocytes Relative: 18.9 % (ref 12.0–46.0)
Lymphs Abs: 1.2 10*3/uL (ref 0.7–4.0)
MCHC: 33.3 g/dL (ref 30.0–36.0)
MCV: 92.7 fl (ref 78.0–100.0)
Monocytes Absolute: 0.5 10*3/uL (ref 0.1–1.0)
Monocytes Relative: 7.8 % (ref 3.0–12.0)
Neutro Abs: 4.5 10*3/uL (ref 1.4–7.7)
Neutrophils Relative %: 69.8 % (ref 43.0–77.0)
Platelets: 182 10*3/uL (ref 150.0–400.0)
RBC: 4.99 Mil/uL (ref 4.22–5.81)
RDW: 13.9 % (ref 11.5–14.6)
WBC: 6.4 10*3/uL (ref 4.5–10.5)

## 2011-01-07 LAB — HEPATIC FUNCTION PANEL
ALT: 13 U/L (ref 0–53)
AST: 26 U/L (ref 0–37)
Albumin: 4.1 g/dL (ref 3.5–5.2)
Total Bilirubin: 1.3 mg/dL — ABNORMAL HIGH (ref 0.3–1.2)

## 2011-01-07 LAB — HEMOGLOBIN A1C: Hgb A1c MFr Bld: 8.2 % — ABNORMAL HIGH (ref 4.6–6.5)

## 2011-01-07 LAB — TSH: TSH: 1.24 u[IU]/mL (ref 0.35–5.50)

## 2011-01-07 MED ORDER — PRAVASTATIN SODIUM 40 MG PO TABS: 40.0000 mg | ORAL_TABLET | Freq: Every day | ORAL | Status: DC

## 2011-01-07 MED ORDER — FUROSEMIDE 40 MG PO TABS: 40.0000 mg | ORAL_TABLET | Freq: Every day | ORAL | Status: DC

## 2011-01-07 MED ORDER — "PEN NEEDLES 5/16"" 31G X 8 MM MISC": 1.0000 "pen " | Freq: Every day | Status: DC

## 2011-01-07 MED ORDER — INSULIN NPH ISOPHANE & REGULAR (70-30) 100 UNIT/ML ~~LOC~~ SUSP: 65.0000 [IU] | Freq: Three times a day (TID) | SUBCUTANEOUS | Status: DC | PRN

## 2011-01-07 MED ORDER — INSULIN GLARGINE 100 UNIT/ML ~~LOC~~ SOLN: 20.0000 [IU] | Freq: Every day | SUBCUTANEOUS | Status: DC

## 2011-01-07 MED ORDER — ENALAPRIL MALEATE 20 MG PO TABS: 20.0000 mg | ORAL_TABLET | Freq: Two times a day (BID) | ORAL | Status: DC

## 2011-01-07 NOTE — Progress Notes (Signed)
Pt needs RFs, done, left vm for patient to check w/pharm

## 2011-01-07 NOTE — Progress Notes (Signed)
Subjective:    Patient ID: Connor Dixon, male    DOB: 12/30/1934, 76 y.o.   MRN: [id number]  HPI The patient is here for annual Medicare wellness examination and management of other chronic and acute problems. He is feeling well. He has seen Dr. Ladona Ridgel in March and his pacemaker is working well with a lower threshold of 60. He has had no chest pain, minimal DOE.    The risk factors are reflected in the social history.  The roster of all physicians providing medical care to patient - is listed in the Snapshot section of the chart.  Activities of daily living:  The patient is 100% inedpendent in all ADLs: dressing, toileting, feeding as well as independent mobility  Home safety : The patient has smoke detectors in the home. They wear seatbelts. No firearms at home. There is no violence in the home.   There is no risks for hepatitis, STDs or HIV. There is no   history of blood transfusion. They have no travel history to infectious disease endemic areas of the world.  The patient has seen their dentist in the last six month. They have seen their eye doctor in the last year, August 23rd. They admit tomild hearing difficulty but have not had audiologic testing in the last year.  They do not  have excessive sun exposure. Discussed the need for sun protection: hats, long sleeves and use of sunscreen if there is significant sun exposure.   Diet: the importance of a healthy diet is discussed. They do have a healthy diet.  The patient does not have a regular exercise program.  The benefits of regular aerobic exercise were discussed.  Depression screen: there are no signs or vegative symptoms of depression- irritability, change in appetite, anhedonia, sadness/tearfullness.  Cognitive assessment: the patient manages all their financial and personal affairs and is actively engaged. They could relate day,date,year and events.  The following portions of the patient's history were reviewed and updated as  appropriate: allergies, current medications, past family history, past medical history,  past surgical history, past social history  and problem list.  Vision, hearing, body mass index were assessed and reviewed.   During the course of the visit the patient was educated and counseled about appropriate screening and preventive services including : fall prevention , diabetes screening, nutrition counseling, colorectal cancer screening, and recommended immunizations.  Past Medical History  Diagnosis Date  . Atrioventricular block, complete   . Hypopotassemia   . Other and unspecified angina pectoris     typical  . Chronic kidney disease, stage II (mild)   . Arthus phenomenon   . Morbid obesity   . HTN (hypertension)     essential  . HLD (hyperlipidemia)   . DM (diabetes mellitus)     adult onset   . Cardiac pacemaker in situ 07/22/2010  . DIABETES MELLITUS, TYPE I, ADULT ONSET 08/06/2007  . ESSENTIAL HYPERTENSION 08/06/2007  . HYPERLIPIDEMIA 08/06/2007   Past Surgical History  Procedure Date  . Total hip arthroplasty 04/05/06    right  . Cataract extraction 8-9/08    [facility]  . Ptvdp 12/11    PPM - [employer]   Family History  Problem Relation Age of Onset  . Colon cancer Neg Hx     no definate prostate CA   . Asthma Father   . Coronary artery disease Father   . Diabetes Father   . Hyperlipidemia Father   . Coronary artery disease Mother   .  Asthma Mother    History   Social History  . Marital Status: Married    Spouse Name: N/A    Number of Children: N/A  . Years of Education: N/A   Occupational History  . Not on file.   Social History Main Topics  . Smoking status: Never Smoker   . Smokeless tobacco: Not on file  . Alcohol Use: No  . Drug Use: No  . Sexually Active:    Other Topics Concern  . Not on file   Social History Narrative   [employer] BA, 2 Master's degrees. [place] - for certification Education specialist. Retired Animal nutritionist.Active -  travels.Married - 1961; 2 sons (1962 and 110) and 5 grandchildren.  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         Review of Systems Review of Systems  Constitutional:  Negative for fever, chills, activity change and unexpected weight change.  HEENT:  Negative for hearing loss, ear pain, congestion, neck stiffness and postnasal drip. Negative for sore throat or swallowing problems. Negative for dental complaints.   Eyes: Negative for vision loss or change in visual acuity.  Respiratory: Negative for chest tightness and wheezing.   Cardiovascular: Negative for chest pain and palpitation. No decreased exercise tolerance Gastrointestinal: No change in bowel habit. No bloating or gas. No reflux or indigestion Genitourinary: Negative for urgency, frequency, flank pain and difficulty urinating.  Musculoskeletal: Negative for myalgias, back pain, arthralgias and gait problem.  Neurological: Negative for dizziness, tremors, weakness and headaches.  Hematological: Negative for adenopathy.  Psychiatric/Behavioral: Negative for behavioral problems and dysphoric mood.       Objective:   Physical Exam Vital signs reviewed Gen'l: Well nourished well developed obese AA male in no acute distress  HEENT:  Head: Normocephalic and atraumatic.  Right Ear: External ear normal. EAC/TM nl Left Ear: External ear normal.  EAC/TM nl Nose: Nose normal.  Mouth/Throat: Oropharynx is clear and moist. Dentition - native, in good repair. No buccal or palatal lesions. Posterior pharynx clear. Eyes: Conjunctivae and sclera clear. EOM intact. Pupils are equal, round, and reactive to light. Right eye exhibits no discharge. Left eye exhibits no discharge. Neck: Normal range of motion. Neck supple. No JVD present. No tracheal deviation present. No thyromegaly present.  Cardiovascular: Normal rate, regular rhythm, no gallop, no friction rub, no murmur heard.      Quiet precordium. 2+ radial and DP  pulses . No carotid bruits Pulmonary/Chest: Effort normal. No respiratory distress or increased WOB, no wheezes, no rales. No chest wall deformity or CVAT. Pacemaker left upper chest. Abdominal: Soft. Bowel sounds are normal in all quadrants. He exhibits no distension, no tenderness, no rebound or guarding, No heptosplenomegaly  Genitourinary:  deferred Musculoskeletal: Normal range of motion. He exhibits no edema and no tenderness.       Small and large joints without redness, synovial thickening or deformity. Full range of motion preserved about all small, median and large joints.  Lymphadenopathy:    He has no cervical or supraclavicular adenopathy.  Neurological: He is alert and oriented to person, place, and time. CN II-XII intact. DTRs 2+ and symmetrical biceps, radial and patellar tendons. Cerebellar function normal with no tremor, rigidity, normal gait and station.  Skin: Skin is warm and dry. No rash noted. No erythema.  Psychiatric: He has a normal mood and affect. His behavior is normal. Thought content normal.   Lab Results  Component Value Date   WBC 6.4 01/07/2011   HGB  15.4 01/07/2011   HCT 46.3 01/07/2011   PLT 182.0 01/07/2011   CHOL 174 01/07/2011   TRIG 44.0 01/07/2011   HDL 50.90 01/07/2011   ALT 13 01/07/2011   ALT 13 01/07/2011   AST 26 01/07/2011   AST 26 01/07/2011   NA 140 01/07/2011   K 5.8* 01/07/2011   CL 108 01/07/2011   CREATININE 1.6* 01/07/2011   BUN 39* 01/07/2011   CO2 25 01/07/2011   TSH 1.24 01/07/2011   PSA 1.77 01/06/2010   HGBA1C 8.2* 01/07/2011   MICROALBUR 44.9* 08/06/2007   Lab Results  Component Value Date   LDLCALC 114* 01/07/2011        Glucose                 140                                                                 01/07/2011       Assessment & Plan:

## 2011-01-09 DIAGNOSIS — Z0000 Encounter for general adult medical examination without abnormal findings: Secondary | ICD-10-CM | POA: Insufficient documentation

## 2011-01-09 NOTE — Assessment & Plan Note (Signed)
He is aware that weight is a major contributor to his underlying chronic diseases.  Plan - he is encouraged to adopt better diet management habits: smart food choices, PORTION SIZE CONTROL, regular exercise.           Goal is too loose 2 lbs per month.

## 2011-01-09 NOTE — Assessment & Plan Note (Signed)
Stable and doing very well with PDVDP - no limitations in activity, no DOE other than that associated with deconditioning.  Plan - follow-up with device clinic as instructed.

## 2011-01-09 NOTE — Assessment & Plan Note (Signed)
Last LDL of 114 is above goal of less than 100. As with diabetes he is on medication but is indiscrete in his diet.  Plan - better diet control - very low fat diet, regular aerobic exercise.

## 2011-01-09 NOTE — Assessment & Plan Note (Signed)
BP Readings from Last 3 Encounters:  01/07/11 132/80  07/22/10 134/72  07/13/10 132/86   Adequate control of blood pressure. Continue present medications.

## 2011-01-09 NOTE — Assessment & Plan Note (Addendum)
Creatinine is stable over the past 12 months at 1.6.   Plan - risk modification - better control of diabetes and weight.           Continue ACE-I

## 2011-01-09 NOTE — Assessment & Plan Note (Signed)
Interval history since hospitalization for complete heart block is benign. Physical exam is noted for obesity. He has had recent opthalmology exam. He is current for colorectal cancer screening with last exam July '11. He is due for Tdap, pnuemonia vaccine and shingles vaccine per record.   In summary - a delightful and intelligent man who needs to work harder on life-style adjustment to his chronic disease state: he is adamantly counseled to exercise, follow a no sugar low carb low fat diet and to loose weight. He will return as needed or in 6 months for interval lab (orders entered)

## 2011-01-09 NOTE — Assessment & Plan Note (Signed)
Poor control with A1C above 8%. He is on an adequate medical regimen but is indiscrete in his diet and weight control. He does have increased microalbumin and is on an ACE-I. He is current with opthalmology for exams.   Plan - continue present medication - fully insulinized.           He is adamantly counseled to exercise more restraint in his diet: no sugar and low carbs, an exercise program.

## 2011-01-24 NOTE — Telephone Encounter (Signed)
closing

## 2011-02-04 ENCOUNTER — Other Ambulatory Visit: Payer: Self-pay | Admitting: Internal Medicine

## 2011-02-10 ENCOUNTER — Other Ambulatory Visit: Payer: Self-pay | Admitting: Orthopedic Surgery

## 2011-02-10 DIAGNOSIS — R102 Pelvic and perineal pain: Secondary | ICD-10-CM

## 2011-02-10 DIAGNOSIS — IMO0002 Reserved for concepts with insufficient information to code with codable children: Secondary | ICD-10-CM

## 2011-02-10 DIAGNOSIS — M25559 Pain in unspecified hip: Secondary | ICD-10-CM

## 2011-02-14 ENCOUNTER — Ambulatory Visit
Admission: RE | Admit: 2011-02-14 | Discharge: 2011-02-14 | Disposition: A | Discharge status: Home / Self Care | Payer: Medicare Other | Source: Ambulatory Visit | Attending: Orthopedic Surgery | Admitting: Orthopedic Surgery

## 2011-02-14 ENCOUNTER — Other Ambulatory Visit: Payer: Medicare Other

## 2011-02-14 DIAGNOSIS — IMO0002 Reserved for concepts with insufficient information to code with codable children: Secondary | ICD-10-CM

## 2011-02-14 DIAGNOSIS — M25559 Pain in unspecified hip: Secondary | ICD-10-CM

## 2011-02-22 ENCOUNTER — Ambulatory Visit (INDEPENDENT_AMBULATORY_CARE_PROVIDER_SITE_OTHER): Payer: Medicare Other

## 2011-02-22 DIAGNOSIS — Z23 Encounter for immunization: Secondary | ICD-10-CM

## 2011-03-02 ENCOUNTER — Other Ambulatory Visit: Payer: Self-pay | Admitting: Internal Medicine

## 2011-03-21 ENCOUNTER — Telehealth: Payer: Self-pay | Admitting: Internal Medicine

## 2011-03-21 NOTE — Telephone Encounter (Signed)
Per Jae Dire  pt need epidural injection on 11/15. Can ASA 81 mg be stop 5 days prior .

## 2011-03-22 NOTE — Telephone Encounter (Signed)
Okay per Dr Ladona Ridgel to stop ASA 81mg  for injection

## 2011-03-24 ENCOUNTER — Other Ambulatory Visit: Payer: Self-pay | Admitting: *Deleted

## 2011-03-24 ENCOUNTER — Ambulatory Visit (INDEPENDENT_AMBULATORY_CARE_PROVIDER_SITE_OTHER): Payer: Medicare Other | Admitting: *Deleted

## 2011-03-24 ENCOUNTER — Encounter: Payer: Self-pay | Admitting: Physician Assistant

## 2011-03-24 ENCOUNTER — Ambulatory Visit (INDEPENDENT_AMBULATORY_CARE_PROVIDER_SITE_OTHER): Payer: Medicare Other | Admitting: Physician Assistant

## 2011-03-24 ENCOUNTER — Encounter: Payer: Self-pay | Admitting: Internal Medicine

## 2011-03-24 DIAGNOSIS — Z950 Presence of cardiac pacemaker: Secondary | ICD-10-CM

## 2011-03-24 DIAGNOSIS — R0600 Dyspnea, unspecified: Secondary | ICD-10-CM

## 2011-03-24 DIAGNOSIS — I442 Atrioventricular block, complete: Secondary | ICD-10-CM

## 2011-03-24 DIAGNOSIS — I10 Essential (primary) hypertension: Secondary | ICD-10-CM

## 2011-03-24 DIAGNOSIS — R0609 Other forms of dyspnea: Secondary | ICD-10-CM

## 2011-03-24 LAB — PACEMAKER DEVICE OBSERVATION
AL IMPEDENCE PM: 425 Ohm
AL THRESHOLD: 0.5 V
ATRIAL PACING PM: 1
BAMS-0001: 150 {beats}/min
BAMS-0003: 70 {beats}/min
BATTERY VOLTAGE: 2.9779 V
RV LEAD AMPLITUDE: 4.1 mv

## 2011-03-24 LAB — CBC WITH DIFFERENTIAL/PLATELET
Eosinophils Relative: 4.4 % (ref 0.0–5.0)
HCT: 44.4 % (ref 39.0–52.0)
Hemoglobin: 14.9 g/dL (ref 13.0–17.0)
Lymphocytes Relative: 18.8 % (ref 12.0–46.0)
Lymphs Abs: 1.3 10*3/uL (ref 0.7–4.0)
MCHC: 33.6 g/dL (ref 30.0–36.0)
Monocytes Relative: 11.5 % (ref 3.0–12.0)
Neutro Abs: 4.5 10*3/uL (ref 1.4–7.7)
Neutrophils Relative %: 64.8 % (ref 43.0–77.0)
Platelets: 189 10*3/uL (ref 150.0–400.0)
RBC: 4.8 Mil/uL (ref 4.22–5.81)
WBC: 6.9 10*3/uL (ref 4.5–10.5)

## 2011-03-24 LAB — BASIC METABOLIC PANEL
CO2: 22 mEq/L (ref 19–32)
Calcium: 9 mg/dL (ref 8.4–10.5)
Chloride: 107 mEq/L (ref 96–112)
Sodium: 139 mEq/L (ref 135–145)

## 2011-03-24 LAB — TSH: TSH: 0.84 u[IU]/mL (ref 0.35–5.50)

## 2011-03-24 LAB — BRAIN NATRIURETIC PEPTIDE: Pro B Natriuretic peptide (BNP): 10 pg/mL (ref 0.0–100.0)

## 2011-03-24 NOTE — Patient Instructions (Signed)
Your physician recommends that you schedule a follow-up appointment in: 1 month with Dr Ladona Ridgel Your physician recommends that you return for lab work in: today (BMP, BNP, CBC)  Your physician has requested that you have an echocardiogram. Echocardiography is a painless test that uses sound waves to create images of your heart. It provides your doctor with information about the size and shape of your heart and how well your heart's chambers and valves are working. This procedure takes approximately one hour. There are no restrictions for this procedure.  Your physician has requested that you have a lexiscan myoview. For further information please visit [email or web address] Please follow instruction sheet, as given.

## 2011-03-24 NOTE — Progress Notes (Signed)
HPI:  Is a 76 year old African American male patient who has history of HTN who developed CHB and underwent PPM in 12/11. He comes in today complaining of dyspnea on exertion with very little activity since he received an injection in his back for back pain October 17. [place] and had to stop several times to catch his breath. He says he can only walk 100 yards before he gets out of breath. He denies any edema, orthopnea, chest pain, dizziness, or presyncope. He did have palpitations on one occasion but it was short-lived. He has not had a stress test in many years.   No Known Allergies  Current Outpatient Prescriptions on File Prior to Visit  Medication Sig Dispense Refill  . aspirin 81 MG tablet Take 81 mg by mouth daily.        . B-D INS SYR MICROFINE .5CC/28G 28G X 1/2" 0.5 ML MISC USE AS DIRECTED TO INJECT LANTUS AND    HUMULIN UP TO 4 TIMES A DAY  120 each  3  . enalapril (VASOTEC) 20 MG tablet Take 1 tablet (20 mg total) by mouth 2 (two) times daily.  180 tablet  3  . furosemide (LASIX) 40 MG tablet Take 1 tablet (40 mg total) by mouth daily.  90 tablet  3  . glucose blood test strip 1 each by Other route as needed. freestyle lite test strip. Test blood glucose 5 times a day ( before bkfst, after each meal, and at bedtime) label glucose levels        . insulin glargine (LANTUS SOLOSTAR) 100 UNIT/ML injection Inject 20 Units into the skin daily.  30 mL  3  . insulin NPH-insulin regular (HUMULIN 70/30) (70-30) 100 UNIT/ML injection Inject 65 Units into the skin 3 (three) times daily as needed. AND AS DIRECTED  30 mL  1  . Insulin Pen Needle (PEN NEEDLES 31GX5/16") 31G X 8 MM MISC 1 pen by Other route daily.  100 each  3  . Misc Natural Products (OSTEO BI-FLEX TRIPLE STRENGTH) TABS Take 2 tablets by mouth daily.       . pravastatin (PRAVACHOL) 40 MG tablet Take 1 tablet (40 mg total) by mouth daily.  90 tablet  3    Past Medical History    Diagnosis Date  . Atrioventricular block, complete   . Hypopotassemia   . Other and unspecified angina pectoris     typical  . Chronic kidney disease, stage II (mild)   . Arthus phenomenon   . Morbid obesity   . HTN (hypertension)     essential  . HLD (hyperlipidemia)   . DM (diabetes mellitus)     adult onset   . Cardiac pacemaker in situ 07/22/2010  . DIABETES MELLITUS, TYPE I, ADULT ONSET 08/06/2007  . ESSENTIAL HYPERTENSION 08/06/2007  . HYPERLIPIDEMIA 08/06/2007    Past Surgical History  Procedure Date  . Total hip arthroplasty 04/05/06    right  . Cataract extraction 8-9/08    [facility]  . Ptvdp 12/11    PPM - [employer]    Family History  Problem Relation Age of Onset  . Colon cancer Neg Hx     no definate prostate CA   . Asthma Father   . Coronary artery disease Father   . Diabetes Father   . Hyperlipidemia Father   . Coronary artery disease Mother   . Asthma Mother     History   Social  History  . Marital Status: Married    Spouse Name: N/A    Number of Children: N/A  . Years of Education: N/A   Occupational History  . Not on file.   Social History Main Topics  . Smoking status: Never Smoker   . Smokeless tobacco: Not on file  . Alcohol Use: No  . Drug Use: No  . Sexually Active:    Other Topics Concern  . Not on file   Social History Narrative   [employer] BA, 2 Master's degrees. 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     ROS: See HPI Eyes: Negative Ears:Negative for hearing loss, tinnitus Cardiovascular: Negative for chest pain, near-syncope, orthopnea, paroxysmal nocturnal dyspnia and syncope,edema, claudication, cyanosis,.  Respiratory:   Says he wheezes occasionally and may have a touch of asthma,Negative for cough, hemoptysis, shortness of breath, sleep disturbances due  to breathing, sputum production Endocrine: Negative for cold intolerance and heat intolerance.  Hematologic/Lymphatic: Negative for adenopathy and bleeding problem. Does not bruise/bleed easily.  Musculoskeletal: Negative.   Gastrointestinal: Negative for nausea, vomiting, reflux, abdominal pain, diarrhea, constipation.denies melena.   Genitourinary: Negative for bladder incontinence, dysuria, flank pain, frequency, hematuria, hesitancy, nocturia and urgency.  Neurological: Negative.  Allergic/Immunologic: positive for environmental allergies.   PHYSICAL EXAM: Obese, in no acute distress. Neck: No JVD, HJR, Bruit, or thyroid enlargement Lungs: No tachypnea, clear without wheezing, rales, or rhonchi Cardiovascular: RRR, PMI not displaced, heart sounds normal, no murmurs, gallops, bruit, thrill, or heave. Abdomen: BS normal. Soft without organomegaly, masses, lesions or tenderness. Extremities: without cyanosis, clubbing or edema.Decreased distal pulses bilateral SKin: Warm, no lesions or rashes  Musculoskeletal: No deformities Neuro: no focal signs  BP 133/85  Pulse 94  Ht 5\' 9"  (1.753 m)  Wt 264 lb (119.75 kg)  BMI 38.99 kg/m2  EKG: atrial sensed,ventricular paced at 87 beats per minute

## 2011-03-24 NOTE — Assessment & Plan Note (Signed)
Pacer check today and functioning normally with less than 1% mode switches.

## 2011-03-24 NOTE — Assessment & Plan Note (Signed)
Patient complains of several week history of dyspnea on exertion which is new for him. We will check a 2D echo to R/O LV dysfunction, Lexiscan to r/o ischemia, and BMET, CBC, TSH, and BNP.

## 2011-03-24 NOTE — Progress Notes (Signed)
PPM check 

## 2011-03-24 NOTE — Assessment & Plan Note (Signed)
Blood pressure control. 

## 2011-03-31 ENCOUNTER — Ambulatory Visit (HOSPITAL_COMMUNITY): Payer: Medicare Other | Attending: Cardiovascular Disease | Admitting: Radiology

## 2011-03-31 ENCOUNTER — Other Ambulatory Visit (INDEPENDENT_AMBULATORY_CARE_PROVIDER_SITE_OTHER): Payer: Medicare Other | Admitting: *Deleted

## 2011-03-31 ENCOUNTER — Ambulatory Visit (HOSPITAL_COMMUNITY): Payer: Medicare Other | Attending: Cardiovascular Disease

## 2011-03-31 VITALS — Ht 69.0 in | Wt 258.0 lb

## 2011-03-31 DIAGNOSIS — E785 Hyperlipidemia, unspecified: Secondary | ICD-10-CM | POA: Insufficient documentation

## 2011-03-31 DIAGNOSIS — R0989 Other specified symptoms and signs involving the circulatory and respiratory systems: Secondary | ICD-10-CM | POA: Insufficient documentation

## 2011-03-31 DIAGNOSIS — I10 Essential (primary) hypertension: Secondary | ICD-10-CM

## 2011-03-31 DIAGNOSIS — Z794 Long term (current) use of insulin: Secondary | ICD-10-CM | POA: Insufficient documentation

## 2011-03-31 DIAGNOSIS — I059 Rheumatic mitral valve disease, unspecified: Secondary | ICD-10-CM | POA: Insufficient documentation

## 2011-03-31 DIAGNOSIS — R0602 Shortness of breath: Secondary | ICD-10-CM

## 2011-03-31 DIAGNOSIS — R0609 Other forms of dyspnea: Secondary | ICD-10-CM | POA: Insufficient documentation

## 2011-03-31 DIAGNOSIS — E119 Type 2 diabetes mellitus without complications: Secondary | ICD-10-CM | POA: Insufficient documentation

## 2011-03-31 DIAGNOSIS — I4949 Other premature depolarization: Secondary | ICD-10-CM

## 2011-03-31 DIAGNOSIS — I447 Left bundle-branch block, unspecified: Secondary | ICD-10-CM

## 2011-03-31 DIAGNOSIS — I079 Rheumatic tricuspid valve disease, unspecified: Secondary | ICD-10-CM | POA: Insufficient documentation

## 2011-03-31 DIAGNOSIS — Z8249 Family history of ischemic heart disease and other diseases of the circulatory system: Secondary | ICD-10-CM | POA: Insufficient documentation

## 2011-03-31 DIAGNOSIS — Z950 Presence of cardiac pacemaker: Secondary | ICD-10-CM | POA: Insufficient documentation

## 2011-03-31 MED ORDER — TECHNETIUM TC 99M TETROFOSMIN IV KIT
11.0000 | PACK | Freq: Once | INTRAVENOUS | Status: AC | PRN
  Administered 2011-03-31: 11 via INTRAVENOUS

## 2011-03-31 MED ORDER — PERFLUTREN PROTEIN A MICROSPH IV SUSP
4.0000 mL | Freq: Once | INTRAVENOUS | Status: AC
  Administered 2011-03-31: 4 mL via INTRAVENOUS

## 2011-03-31 MED ORDER — ADENOSINE (DIAGNOSTIC) 3 MG/ML IV SOLN
0.5600 mg/kg | Freq: Once | INTRAVENOUS | Status: AC
  Administered 2011-03-31: 65.4 mg via INTRAVENOUS

## 2011-03-31 MED ORDER — TECHNETIUM TC 99M TETROFOSMIN IV KIT
33.0000 | PACK | Freq: Once | INTRAVENOUS | Status: AC | PRN
  Administered 2011-03-31: 33 via INTRAVENOUS

## 2011-03-31 NOTE — Progress Notes (Signed)
[facility] SITE 3 NUCLEAR MED [address] [place] [address] [phone]  Cardiology Nuclear Med Study  Connor Dixon is a 76 y.o. male [id number] 05/26/1934   Nuclear Med Background Indication for Stress Test:  Evaluation for Ischemia History: 12/11 Pacemaker: Pacemaker: CHB Cardiac Risk Factors: Family History - CAD, Hypertension, IDDM Type 2 and Lipids  Symptoms:  DOE   Nuclear Pre-Procedure Caffeine/Decaff Intake:  None NPO After: 11:00pm   Lungs: clear IV 0.9% NS with Angio Cath:  20g  IV Site: R Hand  IV Started by:  Bonnita Levan, RN  Chest Size (in):  48 Cup Size: n/a  Height: 5\' 9"  (1.753 m)  Weight:  258 lb (117.028 kg)  BMI:  Body mass index is 38.10 kg/(m^2). Tech Comments:  BS@730A  =115, Labs drawn    Nuclear Med Study 1 or 2 day study: 1 day  Stress Test Type:  Adenosine  Reading MD: Charlton Haws, MD  Order Authorizing Provider:  Dr. Sharrell Ku, Herma Carson, PA  Resting Radionuclide: Technetium 32m Tetrofosmin  Resting Radionuclide Dose:11 mCi   Stress Radionuclide:  Technetium 15m Tetrofosmin  Stress Radionuclide Dose: 33 mCi           Stress Protocol Rest HR: 87 Stress HR: 93  Rest BP: 111/72 Stress BP: 120/69  Exercise Time (min): n/a METS: n/a   Predicted Max HR: 146 bpm % Max HR: 63.7 bpm Rate Pressure Product: 86578   Dose of Adenosine (mg):  60.0 Dose of Lexiscan: n/a mg  Dose of Atropine (mg): n/a Dose of Dobutamine: n/a mcg/kg/min (at max HR)  Stress Test Technologist: Milana Na, EMT-P  Nuclear Technologist:  Domenic Polite, CNMT     Rest Procedure:  Myocardial perfusion imaging was performed at rest 45 minutes following the intravenous administration of Technetium 2m Tetrofosmin. Rest ECG: SR 1st degree AVB with LBBB and a PVC  Stress Procedure:  The patient received IV adenosine at 140 mcg/kg/min for 4 minutes.  There were no changes DX 2nd LBBB with infusion.  Technetium 20m Tetrofosmin was injected  at the 2 minute mark and quantitative spect images were obtained after a 45 minute delay. Stress ECG: No significant change from baseline ECG  QPS Raw Data Images:  Patient motion noted. Stress Images:  There is decreased uptake in the inferior wall. Rest Images:  There is decreased uptake in the inferior wall. Subtraction (SDS):  Mixed defect in the inferior wall Transient Ischemic Dilatation (Normal <1.22): 1.16 Lung/Heart Ratio (Normal <0.45):  .41  Quantitative Gated Spect Images QGS EDV:  88 ml QGS ESV:  36 ml QGS cine images:  NL LV Function; NL Wall Motion QGS EF: 59%  Impression Exercise Capacity:  Adenosine study with no exercise. BP Response:  Normal blood pressure response. Clinical Symptoms:  No chest pain. ECG Impression:  LBBB Comparison with Prior Nuclear Study: No images to compare  Overall Impression:  Small inferoapical wall infarct with mild periinfarct ischemia in the mid inferior wall  EF 59%      Charlton Haws

## 2011-04-05 LAB — BASIC METABOLIC PANEL
BUN: 36 mg/dL — ABNORMAL HIGH (ref 6–23)
Chloride: 107 mEq/L (ref 96–112)
Creatinine, Ser: 1.8 mg/dL — ABNORMAL HIGH (ref 0.4–1.5)
GFR: 48.26 mL/min — ABNORMAL LOW (ref >60.00)
Potassium: 4.8 mEq/L (ref 3.5–5.1)
Sodium: 137 mEq/L (ref 135–145)

## 2011-04-15 ENCOUNTER — Telehealth: Payer: Self-pay | Admitting: Internal Medicine

## 2011-04-15 NOTE — Telephone Encounter (Signed)
Fu Call: Pt returning call from our office. Please call back.  

## 2011-04-15 NOTE — Telephone Encounter (Signed)
Lab results were given to pt. 

## 2011-05-04 ENCOUNTER — Other Ambulatory Visit: Payer: Self-pay | Admitting: Internal Medicine

## 2011-05-11 ENCOUNTER — Encounter: Payer: Self-pay | Admitting: Internal Medicine

## 2011-05-11 ENCOUNTER — Ambulatory Visit (INDEPENDENT_AMBULATORY_CARE_PROVIDER_SITE_OTHER): Payer: Medicare Other | Admitting: Internal Medicine

## 2011-05-11 VITALS — BP 134/78 | HR 87 | Ht 70.0 in | Wt 267.0 lb

## 2011-05-11 DIAGNOSIS — I10 Essential (primary) hypertension: Secondary | ICD-10-CM

## 2011-05-11 DIAGNOSIS — I442 Atrioventricular block, complete: Secondary | ICD-10-CM

## 2011-05-11 DIAGNOSIS — I5033 Acute on chronic diastolic (congestive) heart failure: Secondary | ICD-10-CM | POA: Insufficient documentation

## 2011-05-11 DIAGNOSIS — I509 Heart failure, unspecified: Secondary | ICD-10-CM

## 2011-05-11 DIAGNOSIS — Z950 Presence of cardiac pacemaker: Secondary | ICD-10-CM

## 2011-05-11 LAB — PACEMAKER DEVICE OBSERVATION
AL THRESHOLD: 0.5 V
ATRIAL PACING PM: 390
BAMS-0001: 150 {beats}/min
BATTERY VOLTAGE: 2.9629 V
DEVICE MODEL PM: 7192434
RV LEAD IMPEDENCE PM: 400 Ohm
RV LEAD THRESHOLD: 1 V
VENTRICULAR PACING PM: 400

## 2011-05-11 MED ORDER — FUROSEMIDE 40 MG PO TABS: ORAL_TABLET | ORAL | Status: DC

## 2011-05-11 MED ORDER — CARVEDILOL 6.25 MG PO TABS: 6.2500 mg | ORAL_TABLET | Freq: Two times a day (BID) | ORAL | Status: DC

## 2011-05-11 NOTE — Assessment & Plan Note (Signed)
His device is working normally. He continues to have underlying complete heart block. His atrial rate is increased.

## 2011-05-11 NOTE — Assessment & Plan Note (Signed)
I recommended that he maintain a low-sodium diet and increase his medications as previously noted.

## 2011-05-11 NOTE — Patient Instructions (Signed)
Your physician recommends that you schedule a follow-up appointment in: 6-8 weeks with  Dr Ladona Ridgel   Remote monitoring is used to monitor your Pacemaker of ICD from home. This monitoring reduces the number of office visits required to check your device to one time per year. It allows [place] to keep an eye on the functioning of your device to ensure it is working properly. You are scheduled for a device check from home on 08/11/11. You may send your transmission at any time that day. If you have a wireless device, the transmission will be sent automatically. After your physician reviews your transmission, you will receive a postcard with your next transmission date.   Your physician has recommended you make the following change in your medication:  1) Increase Furosemide to 40mg  in the morning and 20mg  at 1pm 2) Start Carvedilol 6.25mg  1/2 tablet twice a day for 2 weeks then increase to one tablet twice daily

## 2011-05-11 NOTE — Assessment & Plan Note (Signed)
I discussed the nature of the patient's problem. We discussed the importance of maintaining a low-sodium diet. His blood pressure is poorly controlled. I have asked the patient to increase his diuretic dose by 20 mg daily. I have also asked the patient to start carvedilol 3.125 mg twice daily and increase to 6.25 mg twice daily after 2 weeks. I will see him back in several weeks.

## 2011-05-11 NOTE — Progress Notes (Signed)
HPI Connor Dixon returns today for followup. He is a very pleasant 76 year old man with complete heart block, hypertension, and diastolic heart failure. He also has morbid obesity and diabetes. The patient notes the last several weeks he has had increasing shortness of breath. He felt to [facility] and had to walk only short distances before stopping. He admits to dietary indiscretion with sodium. He denies anginal symptoms. He has had some peripheral edema. No Known Allergies   Current Outpatient Prescriptions  Medication Sig Dispense Refill  . aspirin 81 MG tablet Take 81 mg by mouth daily.        . B-D INS SYR MICROFINE .5CC/28G 28G X 1/2" 0.5 ML MISC USE AS DIRECTED TO INJECT LANTUS AND    HUMULIN UP TO 4 TIMES A DAY  120 each  3  . enalapril (VASOTEC) 20 MG tablet Take 1 tablet (20 mg total) by mouth 2 (two) times daily.  180 tablet  3  . furosemide (LASIX) 40 MG tablet Take 40 mg in the morning and 20mg  1/2 tablet at 1pm  135 tablet  3  . glucose blood test strip 1 each by Other route as needed. freestyle lite test strip. Test blood glucose 5 times a day ( before bkfst, after each meal, and at bedtime) label glucose levels        . HUMULIN 70/30 (70-30) 100 UNIT/ML injection INJECT 65 UNITS INTO THE SKIN 3 TIMES   DAILY AS NEEDED. AND AS DIRECTED  30 mL  11  . insulin glargine (LANTUS SOLOSTAR) 100 UNIT/ML injection Inject 20 Units into the skin daily.  30 mL  3  . Insulin Pen Needle (PEN NEEDLES 31GX5/16") 31G X 8 MM MISC 1 pen by Other route daily.  100 each  3  . Misc Natural Products (OSTEO BI-FLEX TRIPLE STRENGTH) TABS Take 2 tablets by mouth daily.       . pravastatin (PRAVACHOL) 40 MG tablet Take 1 tablet (40 mg total) by mouth daily.  90 tablet  3  . DISCONTD: furosemide (LASIX) 40 MG tablet Take 1 tablet (40 mg total) by mouth daily.  90 tablet  3  . carvedilol (COREG) 6.25 MG tablet Take 1 tablet (6.25 mg total) by mouth 2 (two) times daily.  60 tablet  11     Past Medical History   Diagnosis Date  . Atrioventricular block, complete   . Hypopotassemia   . Other and unspecified angina pectoris     typical  . Chronic kidney disease, stage II (mild)   . Arthus phenomenon   . Morbid obesity   . HTN (hypertension)     essential  . HLD (hyperlipidemia)   . DM (diabetes mellitus)     adult onset   . Cardiac pacemaker in situ 07/22/2010  . DIABETES MELLITUS, TYPE I, ADULT ONSET 08/06/2007  . ESSENTIAL HYPERTENSION 08/06/2007  . HYPERLIPIDEMIA 08/06/2007    ROS:   All systems reviewed and negative except as noted in the HPI.   Past Surgical History  Procedure Date  . Total hip arthroplasty 04/05/06    right  . Cataract extraction 8-9/08    [facility]  . Ptvdp 12/11    PPM - [employer]     Family History  Problem Relation Age of Onset  . Colon cancer Neg Hx     no definate prostate CA   . Asthma Father   . Coronary artery disease Father   . Diabetes Father   . Hyperlipidemia Father   .  Coronary artery disease Mother   . Asthma Mother      History   Social History  . Marital Status: Married    Spouse Name: N/A    Number of Children: N/A  . Years of Education: N/A   Occupational History  . Not on file.   Social History Main Topics  . Smoking status: Never Smoker   . Smokeless tobacco: Not on file  . Alcohol Use: No  . Drug Use: No  . Sexually Active:    Other Topics Concern  . Not on file   Social History Narrative   [employer] BA, 2 Master's degrees. [place] - for certification Education specialist. Retired Animal nutritionist.Active - travels.Married - 1961; 2 sons (1962 and 35) and 5 grandchildren.  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      BP 134/78  Pulse 87  Ht 5\' 10"  (1.778 m)  Wt 121.11 kg (267 lb)  BMI 38.31 kg/m2  Physical Exam:  Well appearing, obese, NAD HEENT: Unremarkable Neck:  No JVD, no thyromegally Lymphatics:  No adenopathy Back:  No CVA tenderness Lungs:  Clear except for rales in the  bases bilaterally. No wheezes or rhonchi. No increased work of breathing HEART:  Regular rate rhythm, 2/6 systolic murmur at the right upper sternal border, no rubs, no clicks Abd:  soft, positive bowel sounds, no organomegally, no rebound, no guarding Ext:  2 plus pulses, no edema, no cyanosis, no clubbing Skin:  No rashes no nodules Neuro:  CN II through XII intact, motor grossly intact  DEVICE  Normal device function.  See PaceArt for details.   Assess/Plan:

## 2011-05-25 ENCOUNTER — Ambulatory Visit: Payer: Medicare Other | Admitting: Internal Medicine

## 2011-06-20 ENCOUNTER — Encounter: Payer: Self-pay | Admitting: Internal Medicine

## 2011-06-20 ENCOUNTER — Ambulatory Visit (INDEPENDENT_AMBULATORY_CARE_PROVIDER_SITE_OTHER): Payer: Medicare Other | Admitting: Internal Medicine

## 2011-06-20 DIAGNOSIS — I442 Atrioventricular block, complete: Secondary | ICD-10-CM

## 2011-06-20 DIAGNOSIS — Z950 Presence of cardiac pacemaker: Secondary | ICD-10-CM

## 2011-06-20 DIAGNOSIS — I10 Essential (primary) hypertension: Secondary | ICD-10-CM

## 2011-06-20 LAB — PACEMAKER DEVICE OBSERVATION
AL AMPLITUDE: 1.8 mv
AL THRESHOLD: 0.5 V
ATRIAL PACING PM: 1
BAMS-0001: 150 {beats}/min
BAMS-0003: 70 {beats}/min
DEVICE MODEL PM: 7192434
RV LEAD IMPEDENCE PM: 430 Ohm
VENTRICULAR PACING PM: 99

## 2011-06-20 NOTE — Assessment & Plan Note (Signed)
His device is working normally. We'll plan to recheck in several months. 

## 2011-06-20 NOTE — Assessment & Plan Note (Signed)
The patient admits to dietary indiscretion. I've asked that he increase his physical activity, and eat less.

## 2011-06-20 NOTE — Assessment & Plan Note (Signed)
His blood pressure is well controlled despite his obesity. He will continue his current medications, and maintain a low-sodium diet. I've asked the patient to increase his physical activity, and eat less, so that he might lose weight.

## 2011-06-20 NOTE — Patient Instructions (Addendum)
   Your physician wants you to follow-up in: 12 months with Dr Court Joy will receive a reminder letter in the mail two months in advance. If you don't receive a letter, please call our office to schedule the follow-up appointment.    Remote monitoring is used to monitor your Pacemaker of ICD from home. This monitoring reduces the number of office visits required to check your device to one time per year. It allows [place] to keep an eye on the functioning of your device to ensure it is working properly. You are scheduled for a device check from home on Sep 22, 2011. You may send your transmission at any time that day. If you have a wireless device, the transmission will be sent automatically. After your physician reviews your transmission, you will receive a postcard with your next transmission date.

## 2011-06-20 NOTE — Progress Notes (Signed)
HPI Connor Dixon returns today for followup. He is a very pleasant 77 year old and was symptomatic bradycardia, status post permanent pacemaker insertion, hypertension, diabetes, and dyslipidemia. He has done well in the interim. He remains active and denies chest pain or shortness of breath. No peripheral edema. He travels frequently.  No Known Allergies   Current Outpatient Prescriptions  Medication Sig Dispense Refill  . aspirin 81 MG tablet Take 81 mg by mouth daily.        . B-D INS SYR MICROFINE .5CC/28G 28G X 1/2" 0.5 ML MISC USE AS DIRECTED TO INJECT LANTUS AND    HUMULIN UP TO 4 TIMES A DAY  120 each  3  . carvedilol (COREG) 6.25 MG tablet Take 1 tablet (6.25 mg total) by mouth 2 (two) times daily.  60 tablet  11  . enalapril (VASOTEC) 20 MG tablet Take 1 tablet (20 mg total) by mouth 2 (two) times daily.  180 tablet  3  . furosemide (LASIX) 40 MG tablet Take 40 mg in the morning and 20mg  1/2 tablet at 1pm  135 tablet  3  . glucose blood test strip 1 each by Other route as needed. freestyle lite test strip. Test blood glucose 5 times a day ( before bkfst, after each meal, and at bedtime) label glucose levels        . HUMULIN 70/30 (70-30) 100 UNIT/ML injection INJECT 65 UNITS INTO THE SKIN 3 TIMES   DAILY AS NEEDED. AND AS DIRECTED  30 mL  11  . insulin glargine (LANTUS SOLOSTAR) 100 UNIT/ML injection Inject 20 Units into the skin daily.  30 mL  3  . Insulin Pen Needle (PEN NEEDLES 31GX5/16") 31G X 8 MM MISC 1 pen by Other route daily.  100 each  3  . Misc Natural Products (OSTEO BI-FLEX TRIPLE STRENGTH) TABS Take 2 tablets by mouth daily.       . pravastatin (PRAVACHOL) 40 MG tablet Take 1 tablet (40 mg total) by mouth daily.  90 tablet  3     Past Medical History  Diagnosis Date  . Atrioventricular block, complete   . Hypopotassemia   . Other and unspecified angina pectoris     typical  . Chronic kidney disease, stage II (mild)   . Arthus phenomenon   . Morbid obesity   . HTN  (hypertension)     essential  . HLD (hyperlipidemia)   . DM (diabetes mellitus)     adult onset   . Cardiac pacemaker in situ 07/22/2010  . DIABETES MELLITUS, TYPE I, ADULT ONSET 08/06/2007  . ESSENTIAL HYPERTENSION 08/06/2007  . HYPERLIPIDEMIA 08/06/2007  . Obesity     ROS:   All systems reviewed and negative except as noted in the HPI.   Past Surgical History  Procedure Date  . Total hip arthroplasty 04/05/06    right  . Cataract extraction 8-9/08    [facility]  . Ptvdp 12/11    PPM - [employer]     Family History  Problem Relation Age of Onset  . Colon cancer Neg Hx     no definate prostate CA   . Asthma Father   . Coronary artery disease Father   . Diabetes Father   . Hyperlipidemia Father   . Coronary artery disease Mother   . Asthma Mother   . Heart attack Mother 48  . Stroke Father 28     History   Social History  . Marital Status: Married    Spouse Name:  N/A    Number of Children: N/A  . Years of Education: N/A   Occupational History  . Not on file.   Social History Main Topics  . Smoking status: Never Smoker   . Smokeless tobacco: Not on file  . Alcohol Use: No  . Drug Use: No  . Sexually Active:    Other Topics Concern  . Not on file   Social History Narrative   [employer] BA, 2 Master's degrees. [place] - for certification Education specialist. Retired Animal nutritionist.Active - travels.Married - 1961; 2 sons (1962 and 18) and 5 grandchildren.  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      BP 128/82  Pulse 73  Ht 5\' 10"  (1.778 m)  Wt 122.979 kg (271 lb 1.9 oz)  BMI 38.90 kg/m2  Physical Exam:  Well appearing obese man,  NAD HEENT: Unremarkable Neck:  No JVD, no thyromegally Lungs:  Clear with no wheezes, rales, or rhonchi. Well-healed pacemaker incision.  HEART:  Regular rate rhythm, no murmurs, no rubs, no clicks Abd:  soft, positive bowel sounds, no organomegally, no rebound, no guarding Ext:  2 plus pulses, no  edema, no cyanosis, no clubbing Skin:  No rashes no nodules Neuro:  CN II through XII intact, motor grossly intact  DEVICE  Normal device function.  See PaceArt for details.   Assess/Plan:

## 2011-07-15 ENCOUNTER — Other Ambulatory Visit: Payer: Self-pay | Admitting: *Deleted

## 2011-07-15 MED ORDER — INSULIN PEN NEEDLE 29G X 12.7MM MISC: Status: DC

## 2011-07-18 ENCOUNTER — Other Ambulatory Visit: Payer: Self-pay | Admitting: Internal Medicine

## 2011-07-22 ENCOUNTER — Other Ambulatory Visit: Payer: Self-pay | Admitting: Internal Medicine

## 2011-07-26 IMAGING — CR DG CHEST 2V
2 series · 2 of 2 positions shown · non-contrast
Comparison: 04/15/2010 radiographs and 03/20/2006 CT.

CLINICAL DATA: Heart block.  Pacemaker placement.

CHEST - 2 VIEW

[w chest pa]
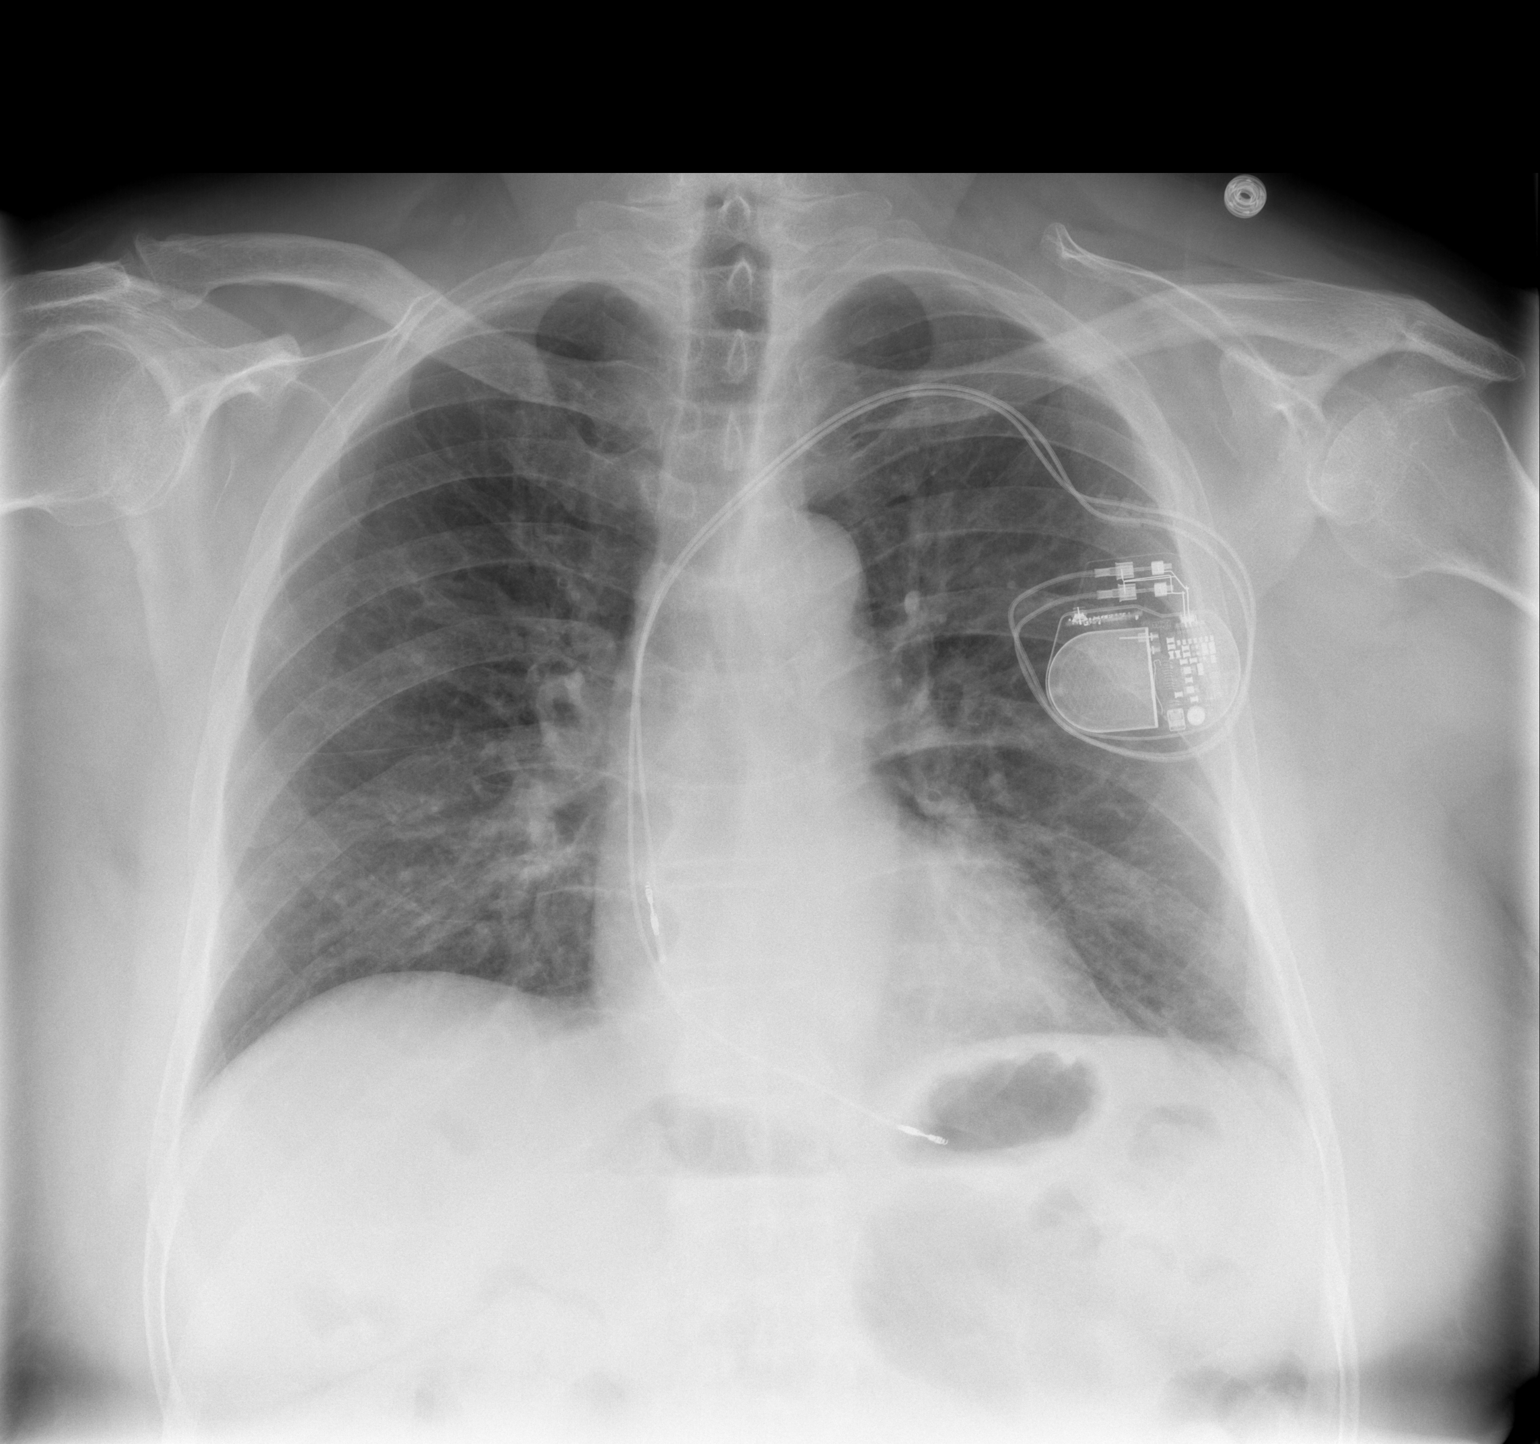

[w chest lat *]
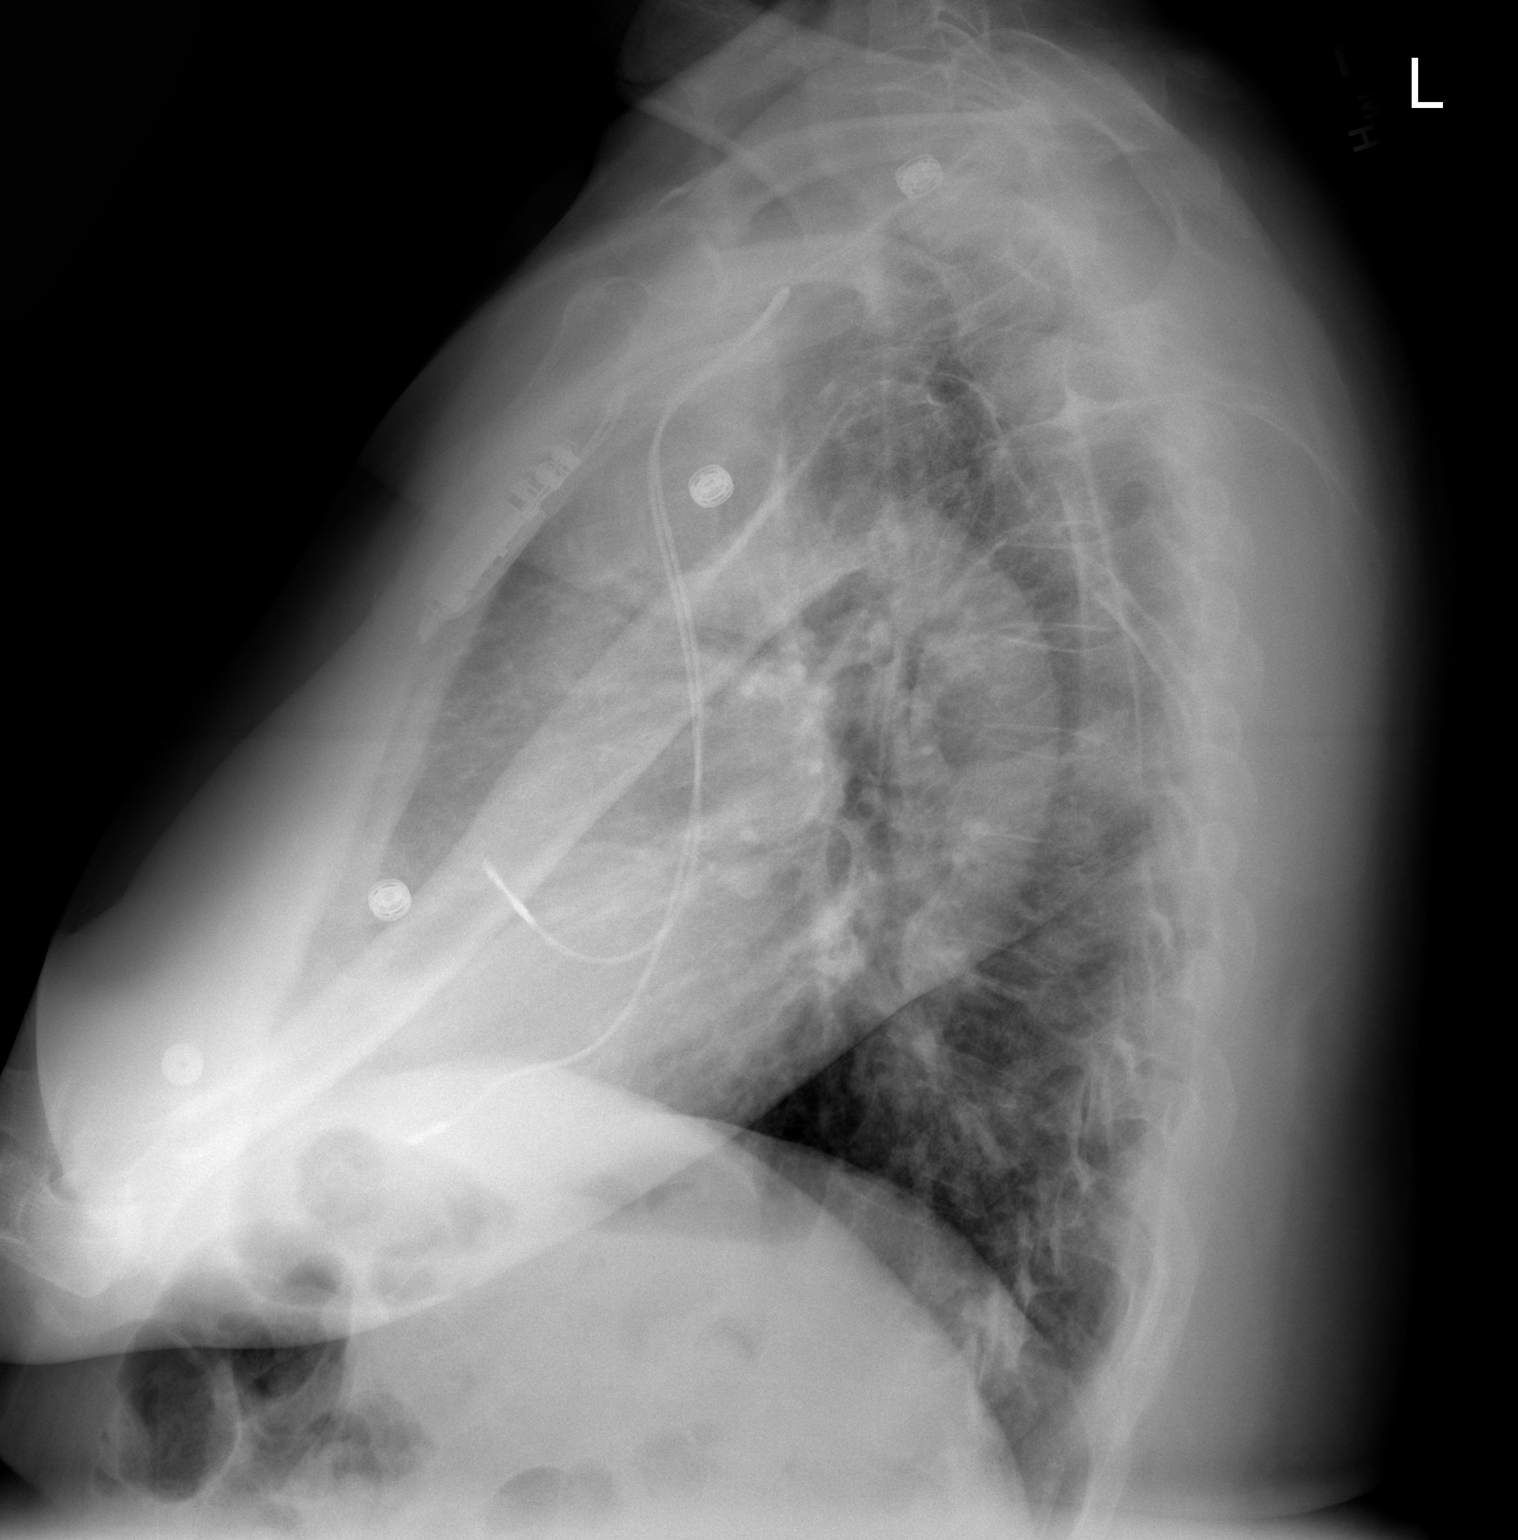

[2 of 2 positions shown; findings below may reference images not displayed]

FINDINGS: The patient was unable to raise her left arm for the
lateral view.  There is a new left subclavian pacemaker with leads
in the right atrium and right ventricle.  There is no evidence of
pneumothorax.  Heart size and mediastinal contours are stable.  The
lungs are clear.  There is no pleural effusion.
IMPRESSION: Pacemaker placement without demonstrated complication.  No acute
cardiopulmonary process.

## 2011-08-11 ENCOUNTER — Encounter: Payer: Medicare Other | Admitting: *Deleted

## 2011-09-09 ENCOUNTER — Other Ambulatory Visit: Payer: Self-pay | Admitting: Internal Medicine

## 2011-09-16 ENCOUNTER — Other Ambulatory Visit: Payer: Self-pay | Admitting: Internal Medicine

## 2011-09-16 MED ORDER — INSULIN NPH ISOPHANE & REGULAR (70-30) 100 UNIT/ML ~~LOC~~ SUSP: SUBCUTANEOUS | Status: DC

## 2011-09-22 ENCOUNTER — Encounter: Payer: Self-pay | Admitting: Internal Medicine

## 2011-09-22 ENCOUNTER — Ambulatory Visit (INDEPENDENT_AMBULATORY_CARE_PROVIDER_SITE_OTHER): Payer: Medicare Other | Admitting: *Deleted

## 2011-09-22 DIAGNOSIS — I442 Atrioventricular block, complete: Secondary | ICD-10-CM

## 2011-09-22 LAB — REMOTE PACEMAKER DEVICE
AL IMPEDENCE PM: 480 Ohm
AL THRESHOLD: 0.75 V
ATRIAL PACING PM: 1
BAMS-0001: 150 {beats}/min
BAMS-0003: 70 {beats}/min
BATTERY VOLTAGE: 2.96 V

## 2011-09-30 ENCOUNTER — Encounter: Payer: Self-pay | Admitting: *Deleted

## 2011-10-07 NOTE — Progress Notes (Signed)
PPM remote 

## 2011-11-16 ENCOUNTER — Other Ambulatory Visit: Payer: Self-pay | Admitting: Internal Medicine

## 2011-12-06 ENCOUNTER — Other Ambulatory Visit: Payer: Self-pay | Admitting: Internal Medicine

## 2011-12-27 ENCOUNTER — Telehealth: Payer: Self-pay | Admitting: Internal Medicine

## 2011-12-27 NOTE — Telephone Encounter (Signed)
Medicare won't pay before Aug 25th! So that day or later.

## 2011-12-27 NOTE — Telephone Encounter (Signed)
The pt is hoping to be seen for a cpe by the end of the month.  His last office visit was 01/07/11.  Do you want him worked in for a cpe? Or a regular visit?

## 2011-12-29 ENCOUNTER — Encounter: Payer: Self-pay | Admitting: Internal Medicine

## 2011-12-29 ENCOUNTER — Ambulatory Visit (INDEPENDENT_AMBULATORY_CARE_PROVIDER_SITE_OTHER): Payer: Medicare Other | Admitting: *Deleted

## 2011-12-29 DIAGNOSIS — I442 Atrioventricular block, complete: Secondary | ICD-10-CM

## 2011-12-29 DIAGNOSIS — Z950 Presence of cardiac pacemaker: Secondary | ICD-10-CM

## 2012-01-02 ENCOUNTER — Encounter: Payer: Self-pay | Admitting: *Deleted

## 2012-01-02 LAB — REMOTE PACEMAKER DEVICE
AL AMPLITUDE: 1.2 mv
AL IMPEDENCE PM: 460 Ohm
AL THRESHOLD: 0.75 V
ATRIAL PACING PM: 1
BAMS-0001: 150 {beats}/min
BAMS-0003: 70 {beats}/min
BATTERY VOLTAGE: 2.96 V
DEVICE MODEL PM: 7192434
RV LEAD AMPLITUDE: 6.8 mv
VENTRICULAR PACING PM: 100

## 2012-01-04 ENCOUNTER — Encounter: Payer: Self-pay | Admitting: Internal Medicine

## 2012-01-06 ENCOUNTER — Encounter: Payer: Self-pay | Admitting: *Deleted

## 2012-01-09 ENCOUNTER — Encounter: Payer: Self-pay | Admitting: Internal Medicine

## 2012-01-09 ENCOUNTER — Other Ambulatory Visit (INDEPENDENT_AMBULATORY_CARE_PROVIDER_SITE_OTHER): Payer: Medicare Other

## 2012-01-09 ENCOUNTER — Ambulatory Visit (INDEPENDENT_AMBULATORY_CARE_PROVIDER_SITE_OTHER): Payer: Medicare Other | Admitting: Internal Medicine

## 2012-01-09 VITALS — BP 112/72 | HR 71 | Temp 98.4°F | Resp 16 | Wt 270.0 lb

## 2012-01-09 DIAGNOSIS — I10 Essential (primary) hypertension: Secondary | ICD-10-CM

## 2012-01-09 DIAGNOSIS — E785 Hyperlipidemia, unspecified: Secondary | ICD-10-CM

## 2012-01-09 DIAGNOSIS — N182 Chronic kidney disease, stage 2 (mild): Secondary | ICD-10-CM

## 2012-01-09 DIAGNOSIS — E109 Type 1 diabetes mellitus without complications: Secondary | ICD-10-CM

## 2012-01-09 DIAGNOSIS — I209 Angina pectoris, unspecified: Secondary | ICD-10-CM

## 2012-01-09 DIAGNOSIS — R0609 Other forms of dyspnea: Secondary | ICD-10-CM

## 2012-01-09 DIAGNOSIS — Z0000 Encounter for general adult medical examination without abnormal findings: Secondary | ICD-10-CM

## 2012-01-09 DIAGNOSIS — Z23 Encounter for immunization: Secondary | ICD-10-CM

## 2012-01-09 DIAGNOSIS — Z950 Presence of cardiac pacemaker: Secondary | ICD-10-CM

## 2012-01-09 LAB — COMPREHENSIVE METABOLIC PANEL
ALT: 18 U/L (ref 0–53)
Alkaline Phosphatase: 81 U/L (ref 39–117)
BUN: 50 mg/dL — ABNORMAL HIGH (ref 6–23)
CO2: 26 mEq/L (ref 19–32)
Creatinine, Ser: 1.9 mg/dL — ABNORMAL HIGH (ref 0.4–1.5)
GFR: 43.87 mL/min — ABNORMAL LOW (ref >60.00)
Glucose, Bld: 222 mg/dL — ABNORMAL HIGH (ref 70–99)
Sodium: 136 mEq/L (ref 135–145)
Total Bilirubin: 1.1 mg/dL (ref 0.3–1.2)
Total Protein: 7.1 g/dL (ref 6.0–8.3)

## 2012-01-09 LAB — HEPATIC FUNCTION PANEL
ALT: 18 U/L (ref 0–53)
AST: 26 U/L (ref 0–37)
Albumin: 3.9 g/dL (ref 3.5–5.2)
Bilirubin, Direct: 0.2 mg/dL (ref 0.0–0.3)
Total Bilirubin: 1.1 mg/dL (ref 0.3–1.2)

## 2012-01-09 LAB — LIPID PANEL
Cholesterol: 134 mg/dL (ref 0–200)
HDL: 45.3 mg/dL (ref >39.00)
Triglycerides: 73 mg/dL (ref 0.0–149.0)
VLDL: 14.6 mg/dL (ref 0.0–40.0)

## 2012-01-09 LAB — HEMOGLOBIN A1C: Hgb A1c MFr Bld: 8.2 % — ABNORMAL HIGH (ref 4.6–6.5)

## 2012-01-09 MED ORDER — ENALAPRIL MALEATE 20 MG PO TABS: 20.0000 mg | ORAL_TABLET | Freq: Two times a day (BID) | ORAL | Status: DC

## 2012-01-09 NOTE — Progress Notes (Signed)
Subjective:    Patient ID: Connor Dixon, male    DOB: 31-Mar-1935, 77 y.o.   MRN: [id number]  HPI The patient is here for annual Medicare wellness examination and management of other chronic and acute problems.  CC: he reports that he has a problem with increased DOE - limited to about 100 yds before having to stop or slow down. No SOB at rest. He does have some increased swelling of the feet and ankles. He also has a little burning pain in the feet. He also has some arthritic pain: right hip and low back. He is s/p right THR   The risk factors are reflected in the social history.  The roster of all physicians providing medical care to patient - is listed in the Snapshot section of the chart.  Activities of daily living:  The patient is 100% inedpendent in all ADLs: dressing, toileting, feeding as well as independent mobility  Home safety : The patient has smoke detectors in the home. Fall - has made the house fall safe. They wear seatbelts.  firearms are present in the home, kept in a safe fashion. There is no violence in the home.   There is no risks for hepatitis, STDs or HIV. There is no   history of blood transfusion. They have no travel history to infectious disease endemic areas of the world.  The patient has seen their dentist in the last six month. They have seen their eye doctor in the last year. They admit to hearing difficulty, especially telephone and have not had audiologic testing in the last year.   They do not  have excessive sun exposure. Discussed the need for sun protection: hats, long sleeves and use of sunscreen if there is significant sun exposure.   Diet: the importance of a healthy diet is discussed. They do have a healthy but not a strict diabetic diet.  The patient has no regular exercise program.  The benefits of regular aerobic exercise were discussed.  Depression screen: there are no signs or vegative symptoms of depression- irritability, change in appetite,  anhedonia, sadness/tearfullness.  Cognitive assessment: the patient manages all their financial and personal affairs and is actively engaged.   The following portions of the patient's history were reviewed and updated as appropriate: allergies, current medications, past family history, past medical history,  past surgical history, past social history  and problem list.  Vision, hearing, body mass index were assessed and reviewed.   During the course of the visit the patient was educated and counseled about appropriate screening and preventive services including : fall prevention , diabetes screening, nutrition counseling, colorectal cancer screening, and recommended immunizations.  Past Medical History  Diagnosis Date  . Atrioventricular block, complete   . Hypopotassemia   . Other and unspecified angina pectoris     typical  . Chronic kidney disease, stage II (mild)   . Arthus phenomenon   . Morbid obesity   . HTN (hypertension)     essential  . HLD (hyperlipidemia)   . DM (diabetes mellitus)     adult onset   . Cardiac pacemaker in situ 07/22/2010  . DIABETES MELLITUS, TYPE I, ADULT ONSET 08/06/2007  . ESSENTIAL HYPERTENSION 08/06/2007  . HYPERLIPIDEMIA 08/06/2007  . Obesity    Past Surgical History  Procedure Date  . Total hip arthroplasty 04/05/06    right  . Cataract extraction 8-9/08    [facility]  . Ptvdp 12/11    PPM - St. Jude  Family History  Problem Relation Age of Onset  . Colon cancer Neg Hx     no definate prostate CA   . Asthma Father   . Coronary artery disease Father   . Diabetes Father   . Hyperlipidemia Father   . Coronary artery disease Mother   . Asthma Mother   . Heart attack Mother 47  . Stroke Father 34   History   Social History  . Marital Status: Married    Spouse Name: N/A    Number of Children: N/A  . Years of Education: N/A   Occupational History  . Not on file.   Social History Main Topics  . Smoking status: Never Smoker   .  Smokeless tobacco: Not on file  . Alcohol Use: No  . Drug Use: No  . Sexually Active:    Other Topics Concern  . Not on file   Social History Narrative   [employer] BA, 2 Master's degrees. [place] - for certification Education specialist. Retired Animal nutritionist.Active - travels.Married - 1961; 2 sons (1962 and 39) and 5 grandchildren.  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        Review of Systems Constitutional:  Negative for fever, chills, activity change and unexpected weight change.  HEENT:  Negative for hearing loss, ear pain, congestion, neck stiffness. Positive for postnasal drip and sore throat. No  swallowing problems. Negative for dental complaints.   Eyes: Negative for vision loss or change in visual acuity.  Respiratory: Negative for chest tightness and wheezing. Negative for DOE.   Cardiovascular: Negative for chest pain or palpitations. No decreased exercise tolerance Gastrointestinal: No change in bowel habit. No bloating or gas. No reflux or indigestion Genitourinary: Positive for urgency, frequency. No flank pain and difficulty urinating.  Musculoskeletal: Negative for myalgias, back pain, arthralgias and gait problem.  Neurological: Negative for dizziness, tremors, weakness and headaches.  Hematological: Negative for adenopathy.  Psychiatric/Behavioral: Negative for behavioral problems and dysphoric mood.       Objective:   Physical Exam Filed Vitals:   01/09/12 1041  BP: 112/72  Pulse: 71  Temp: 98.4 F (36.9 C)  Resp: 16   Wt Readings from Last 3 Encounters:  01/09/12 270 lb (122.471 kg)  06/20/11 271 lb 1.9 oz (122.979 kg)  05/11/11 267 lb (121.11 kg)   Gen'l: Well nourished well developed morbidly obese male in no acute distress  HEENT: Head: Normocephalic and atraumatic. Right Ear: External ear normal. EAC/TM nl. Left Ear: External ear normal.  EAC/TM nl. Nose: Nose normal. Mouth/Throat: Oropharynx is clear and moist. Dentition  - native, missing many teeth but remaining teeth are ok. No buccal or palatal lesions. Posterior pharynx clear. Eyes: Conjunctivae and sclera clear. EOM intact. Pupils are equal, round, and reactive to light. Right eye exhibits no discharge. Left eye exhibits no discharge. Neck: Normal range of motion. Neck supple. No JVD present. No tracheal deviation present. No thyromegaly present.  Cardiovascular: Normal rate, regular rhythm, no gallop, no friction rub, no murmur heard.      Quiet precordium. 2+ radial and DP pulses . No carotid bruits Pulmonary/Chest: Effort normal. No respiratory distress or increased WOB, no wheezes, no rales. No chest wall deformity or CVAT. Abdomen: Soft. Bowel sounds are normal in all quadrants. He exhibits no distension, no tenderness, no rebound or guarding, Cannot assess for heptosplenomegaly due to obesity. Genitourinary:  deferred Musculoskeletal: Normal range of motion. He exhibits trace to 1+ pedal edema.  no tenderness. He  is missing index finger left hand.      Small and large joints without redness, synovial thickening or deformity. Full range of motion preserved about all small, median and large joints.  Lymphadenopathy:    He has no cervical or supraclavicular adenopathy.  Neurological: He is alert and oriented to person, place, and time. CN II-XII intact. DTRs 2+ and symmetrical biceps, radial and patellar tendons. Cerebellar function normal with no tremor, rigidity, normal gait and station.  Skin: Skin is warm and dry. No rash noted. No erythema.  Psychiatric: He has a normal mood and affect. His behavior is normal. Thought content normal.   Lab Results  Component Value Date   WBC 6.9 03/24/2011   HGB 14.9 03/24/2011   HCT 44.4 03/24/2011   PLT 189.0 03/24/2011   GLUCOSE 222* 01/09/2012   CHOL 134 01/09/2012   TRIG 73.0 01/09/2012   HDL 45.30 01/09/2012   LDLCALC 74 01/09/2012        ALT 18 01/09/2012   AST 26 01/09/2012        NA 136 01/09/2012   K 4.9  01/09/2012   CL 103 01/09/2012   CREATININE 1.9* 01/09/2012   BUN 50* 01/09/2012   CO2 26 01/09/2012   TSH 0.84 03/24/2011   PSA 1.77 01/06/2010   HGBA1C 8.2* 01/09/2012   MICROALBUR 44.9* 08/06/2007          Assessment & Plan:

## 2012-01-09 NOTE — Assessment & Plan Note (Signed)
Patient with DOE but no frank chest pain/angina.

## 2012-01-09 NOTE — Assessment & Plan Note (Addendum)
Interval medical history - significant for increased DOE but no frank angina. Physical exam notable for obesity otherwise normal. Colorectal cancer screening - Colonoscopy - July '11.  Aged out for prostate cancer screening (ACU April '13 recommendations). Immunizations: Tetanus Aug '13; Pneumonia vaccine Aug '13; will return for shingles vaccine.  In summary - a very nice man who is obese and has out of control diabetes. His risk profile for CAD is very high but symptoms are atypical. He is adamantly directed to loose weight and adhere to diabetic diet. He will return as needed on in 1 year.

## 2012-01-09 NOTE — Assessment & Plan Note (Signed)
Non-adherent to diabetic diet, morbid obesity but does take his medication. No evidence of peripheral neuropathy with normal sensation right foot  Plan  A1C with recommendations to follow  Addendum: A1C 8.2% Plan - dietary adherence, weight loss will follow  Keep up with annual eye exam  Continue basal and before meal insulin

## 2012-01-09 NOTE — Assessment & Plan Note (Signed)
Morbid obesity continues to be a major health risk. He has been unsuccessful in weight management. He is aware of the risk his weight poses.  Plan - weight management: smart food choices - diabetic; PORTION SIZE CONTROL, regular aerobic exercise.  Target - BMI of 30 = weight approx. 210 lbs = 60 lbs weight loss  Goal - to loose 2 bronchospasm /month - 3 year project.

## 2012-01-09 NOTE — Assessment & Plan Note (Signed)
LDL @ 74 is better than goal of 80.  Plan - Continue pravastatin 40 mg daily  Weight management

## 2012-01-09 NOTE — Assessment & Plan Note (Signed)
Recurrent and worsening problem. Major factors include morbid obesity and a component of diastolic dysfunction.  Plan  2 D echo to reassess EF, diastolic dysfunction and r/o wall motion abnormality  Continued risk management: Weight loss, control of BP and diabetes

## 2012-01-09 NOTE — Assessment & Plan Note (Signed)
Renal function appears to be stable.  Plan Risk factor mgt - better control of diabetes, BP control, weight mgt.  Continue ACE-I

## 2012-01-09 NOTE — Assessment & Plan Note (Signed)
No report of any arrythmia. Is current with follow-up with Dr.Taylor

## 2012-01-10 ENCOUNTER — Other Ambulatory Visit: Payer: Self-pay | Admitting: *Deleted

## 2012-01-10 MED ORDER — PRAVASTATIN SODIUM 40 MG PO TABS: 40.0000 mg | ORAL_TABLET | Freq: Every day | ORAL | Status: DC

## 2012-01-17 ENCOUNTER — Ambulatory Visit (HOSPITAL_COMMUNITY): Payer: Medicare Other | Attending: Cardiology | Admitting: Radiology

## 2012-01-17 DIAGNOSIS — R0609 Other forms of dyspnea: Secondary | ICD-10-CM | POA: Insufficient documentation

## 2012-01-17 DIAGNOSIS — I359 Nonrheumatic aortic valve disorder, unspecified: Secondary | ICD-10-CM | POA: Insufficient documentation

## 2012-01-17 DIAGNOSIS — I079 Rheumatic tricuspid valve disease, unspecified: Secondary | ICD-10-CM | POA: Insufficient documentation

## 2012-01-17 DIAGNOSIS — E119 Type 2 diabetes mellitus without complications: Secondary | ICD-10-CM | POA: Insufficient documentation

## 2012-01-17 DIAGNOSIS — R0989 Other specified symptoms and signs involving the circulatory and respiratory systems: Secondary | ICD-10-CM | POA: Insufficient documentation

## 2012-01-17 DIAGNOSIS — I10 Essential (primary) hypertension: Secondary | ICD-10-CM | POA: Insufficient documentation

## 2012-01-17 DIAGNOSIS — E785 Hyperlipidemia, unspecified: Secondary | ICD-10-CM | POA: Insufficient documentation

## 2012-01-17 NOTE — Progress Notes (Signed)
Echocardiogram performed.  

## 2012-01-19 ENCOUNTER — Other Ambulatory Visit: Payer: Self-pay | Admitting: Internal Medicine

## 2012-01-20 NOTE — Telephone Encounter (Signed)
Rx FOR FREESTYLE LITE TEST STRIPS CALLED TO [facility]

## 2012-01-22 ENCOUNTER — Encounter: Payer: Self-pay | Admitting: Internal Medicine

## 2012-01-31 ENCOUNTER — Other Ambulatory Visit: Payer: Self-pay | Admitting: *Deleted

## 2012-01-31 MED ORDER — GLUCOSE BLOOD VI STRP: ORAL_STRIP | Status: DC

## 2012-02-29 ENCOUNTER — Other Ambulatory Visit: Payer: Self-pay | Admitting: Internal Medicine

## 2012-02-29 MED ORDER — INSULIN GLARGINE 100 UNIT/ML ~~LOC~~ SOLN: 20.0000 [IU] | Freq: Every day | SUBCUTANEOUS | Status: DC

## 2012-02-29 NOTE — Telephone Encounter (Signed)
Refill of solostar flex pen lantus sent to [facility]. Patient notified

## 2012-02-29 NOTE — Telephone Encounter (Signed)
Per pts wife pharmacy has contacted [place] 4x with no response, pt needs refill on Lantus pen sent to [facility]

## 2012-03-02 ENCOUNTER — Other Ambulatory Visit: Payer: Self-pay

## 2012-03-02 MED ORDER — INSULIN GLARGINE 100 UNIT/ML ~~LOC~~ SOLN: 20.0000 [IU] | Freq: Every day | SUBCUTANEOUS | Status: DC

## 2012-03-14 ENCOUNTER — Other Ambulatory Visit: Payer: Self-pay | Admitting: Internal Medicine

## 2012-04-02 ENCOUNTER — Encounter: Payer: Self-pay | Admitting: Internal Medicine

## 2012-04-02 ENCOUNTER — Ambulatory Visit (INDEPENDENT_AMBULATORY_CARE_PROVIDER_SITE_OTHER): Payer: Medicare Other | Admitting: *Deleted

## 2012-04-02 DIAGNOSIS — I442 Atrioventricular block, complete: Secondary | ICD-10-CM

## 2012-04-02 DIAGNOSIS — Z950 Presence of cardiac pacemaker: Secondary | ICD-10-CM

## 2012-04-03 LAB — REMOTE PACEMAKER DEVICE
ATRIAL PACING PM: 1
BAMS-0001: 150 {beats}/min
BAMS-0003: 70 {beats}/min
RV LEAD IMPEDENCE PM: 430 Ohm
RV LEAD THRESHOLD: 0.875 V
VENTRICULAR PACING PM: 99

## 2012-04-05 ENCOUNTER — Other Ambulatory Visit: Payer: Self-pay | Admitting: Internal Medicine

## 2012-04-25 ENCOUNTER — Other Ambulatory Visit: Payer: Self-pay

## 2012-04-25 ENCOUNTER — Encounter: Payer: Self-pay | Admitting: *Deleted

## 2012-04-25 MED ORDER — FREESTYLE LITE DEVI: Status: DC

## 2012-05-01 ENCOUNTER — Other Ambulatory Visit: Payer: Self-pay | Admitting: *Deleted

## 2012-05-01 MED ORDER — INSULIN NPH ISOPHANE & REGULAR (70-30) 100 UNIT/ML ~~LOC~~ SUSP: SUBCUTANEOUS | Status: DC

## 2012-05-02 ENCOUNTER — Ambulatory Visit (INDEPENDENT_AMBULATORY_CARE_PROVIDER_SITE_OTHER): Payer: Medicare Other | Admitting: Internal Medicine

## 2012-05-02 ENCOUNTER — Encounter: Payer: Self-pay | Admitting: Internal Medicine

## 2012-05-02 VITALS — BP 122/82 | HR 96 | Ht 70.0 in | Wt 273.0 lb

## 2012-05-02 DIAGNOSIS — I5033 Acute on chronic diastolic (congestive) heart failure: Secondary | ICD-10-CM

## 2012-05-02 DIAGNOSIS — Z950 Presence of cardiac pacemaker: Secondary | ICD-10-CM

## 2012-05-02 DIAGNOSIS — I509 Heart failure, unspecified: Secondary | ICD-10-CM

## 2012-05-02 DIAGNOSIS — I442 Atrioventricular block, complete: Secondary | ICD-10-CM

## 2012-05-02 NOTE — Progress Notes (Signed)
HPI Connor Dixon he is a very pleasant morbidly obese 77 year old man with a history of complete heart block, status post permanent pacemaker insertion. He also has a history of diabetes, hypertension, and peripheral edema. He remains active, working in his yard. No syncope. No chest pain or shortness of breath. He is in are all in a well No Known Allergies   Current Outpatient Prescriptions  Medication Sig Dispense Refill  . aspirin 81 MG tablet Take 81 mg by mouth daily.        . B-D INS SYRINGE 0.5CC/30GX1/2" 30G X 1/2" 0.5 ML MISC USE AS DIRECTED TO INJECT LANTUS AND    HUMULIN UP TO 4 TIMES A DAY  120 each  3  . Blood Glucose Monitoring Suppl (FREESTYLE LITE) DEVI Dx:250.01 Test tid  1 each  1  . carvedilol (COREG) 6.25 MG tablet Take 1 tablet (6.25 mg total) by mouth 2 (two) times daily.  60 tablet  11  . enalapril (VASOTEC) 20 MG tablet Take 1 tablet (20 mg total) by mouth 2 (two) times daily.  180 tablet  6  . furosemide (LASIX) 40 MG tablet Take 40 mg in the morning and 20mg  1/2 tablet at 1pm  135 tablet  3  . glucose blood (FREESTYLE LITE) test strip Use as instructed  150 each  3  . insulin glargine (LANTUS) 100 UNIT/ML injection Inject 20 Units into the skin at bedtime. solostar flex pen  15 mL  6  . insulin NPH-insulin regular (HUMULIN 70/30 PEN) (70-30) 100 UNIT/ML injection INJECT 65 UNITS AS DIRECTED  15 mL  1  . Insulin Pen Needle (PEN NEEDLES 31GX5/16") 31G X 8 MM MISC 1 pen by Other route daily.  100 each  3  . Insulin Pen Needle 29G X 12.7MM MISC BD Pen Needle Ultrafine. Use as Directed with Insulin Pens.  100 each  5  . Lancets (FREESTYLE) lancets TEST BLOOD GLUCOSE AS    DIRECTED  100 each  4  . Misc Natural Products (OSTEO BI-FLEX TRIPLE STRENGTH) TABS Take 2 tablets by mouth daily.       . pravastatin (PRAVACHOL) 40 MG tablet Take 1 tablet (40 mg total) by mouth daily.  90 tablet  3     Past Medical History  Diagnosis Date  . Atrioventricular block, complete   .  Hypopotassemia   . Other and unspecified angina pectoris     typical  . Chronic kidney disease, stage II (mild)   . Arthus phenomenon   . Morbid obesity   . HTN (hypertension)     essential  . HLD (hyperlipidemia)   . DM (diabetes mellitus)     adult onset   . Cardiac pacemaker in situ 07/22/2010  . DIABETES MELLITUS, TYPE I, ADULT ONSET 08/06/2007  . ESSENTIAL HYPERTENSION 08/06/2007  . HYPERLIPIDEMIA 08/06/2007  . Obesity     ROS:   All systems reviewed and negative except as noted in the HPI.   Past Surgical History  Procedure Date  . Total hip arthroplasty 04/05/06    right  . Cataract extraction 8-9/08    [facility]  . Ptvdp 12/11    PPM - [employer]. Jude     Family History  Problem Relation Age of Onset  . Colon cancer Neg Hx     no definate prostate CA   . Asthma Father   . Coronary artery disease Father   . Diabetes Father   . Hyperlipidemia Father   . Coronary artery disease Mother   .  Asthma Mother   . Heart attack Mother 16  . Stroke Father 34     History   Social History  . Marital Status: Married    Spouse Name: N/A    Number of Children: N/A  . Years of Education: N/A   Occupational History  . Not on file.   Social History Main Topics  . Smoking status: Never Smoker   . Smokeless tobacco: Not on file  . Alcohol Use: No  . Drug Use: No  . Sexually Active:    Other Topics Concern  . Not on file   Social History Narrative   [employer] BA, 2 Master's degrees. [place] - for certification Education specialist. Retired Animal nutritionist.Active - travels.Married - 1961; 2 sons (1962 and 68) and 5 grandchildren.  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      BP 122/82  Pulse 96  Ht 5\' 10"  (1.778 m)  Wt 273 lb (123.832 kg)  BMI 39.17 kg/m2  Physical Exam:  Well appearing obese 77 year old man, NAD HEENT: Unremarkable Neck:  7 cm JVD, no thyromegally Lungs:  Clear with no wheezes, rales, or rhonchi. HEART:  Regular rate  rhythm, no murmurs, no rubs, no clicks Abd:  soft, positive bowel sounds, no organomegally, no rebound, no guarding Ext:  2 plus pulses, no edema, no cyanosis, no clubbing Skin:  No rashes no nodules Neuro:  CN II through XII intact, motor grossly intact  EKG normal sinus rhythm with P. synchronous ventricular pacing.  DEVICE  Normal device function.  See PaceArt for details.   Assess/Plan:

## 2012-05-02 NOTE — Assessment & Plan Note (Signed)
His St. Jude dual-chamber pacemaker is working normally. He has underlying complete heart block. No programming parameters have been changed today. We'll recheck his pacemaker in several months.

## 2012-05-02 NOTE — Patient Instructions (Addendum)
Remote monitoring is used to monitor your Pacemaker of ICD from home. This monitoring reduces the number of office visits required to check your device to one time per year. It allows us to keep an eye on the functioning of your device to ensure it is working properly. You are scheduled for a device check from home on August 06, 2012. You may send your transmission at any time that day. If you have a wireless device, the transmission will be sent automatically. After your physician reviews your transmission, you will receive a postcard with your next transmission date.  Your physician wants you to follow-up in: 1 year with Dr Taylor.  You will receive a reminder letter in the mail two months in advance. If you don't receive a letter, please call our office to schedule the follow-up appointment.  

## 2012-05-02 NOTE — Assessment & Plan Note (Signed)
His heart failure symptoms are class II. No change in medical therapy today. He admits to dietary indiscretion. He had bacon for breakfast. I've asked him to stop eating salty meats including bacon sausage.

## 2012-05-02 NOTE — Assessment & Plan Note (Signed)
I discussed the importance of weight loss with the patient. He admits to dietary indiscretion. He states that he will try to do better in a year.

## 2012-05-03 ENCOUNTER — Other Ambulatory Visit: Payer: Self-pay | Admitting: *Deleted

## 2012-05-03 MED ORDER — INSULIN NPH ISOPHANE & REGULAR (70-30) 100 UNIT/ML ~~LOC~~ SUSP: SUBCUTANEOUS | Status: DC

## 2012-05-03 NOTE — Telephone Encounter (Signed)
Pt is requesting refill of insulin pen-states that pharmacy has faxed over request several times-has not received.

## 2012-05-04 ENCOUNTER — Other Ambulatory Visit: Payer: Self-pay | Admitting: *Deleted

## 2012-05-04 LAB — PACEMAKER DEVICE OBSERVATION
AL AMPLITUDE: 1.6 mv
AL THRESHOLD: 0.75 V
BAMS-0001: 150 {beats}/min
BAMS-0003: 70 {beats}/min
DEVICE MODEL PM: 7192434
RV LEAD IMPEDENCE PM: 440 Ohm
VENTRICULAR PACING PM: 99

## 2012-05-04 MED ORDER — INSULIN NPH ISOPHANE & REGULAR (70-30) 100 UNIT/ML ~~LOC~~ SUSP: SUBCUTANEOUS | Status: DC

## 2012-05-10 ENCOUNTER — Other Ambulatory Visit: Payer: Self-pay | Admitting: Internal Medicine

## 2012-05-14 ENCOUNTER — Other Ambulatory Visit: Payer: Self-pay | Admitting: *Deleted

## 2012-05-14 MED ORDER — INSULIN NPH ISOPHANE & REGULAR (70-30) 100 UNIT/ML ~~LOC~~ SUSP: SUBCUTANEOUS | Status: DC

## 2012-05-24 IMAGING — CT CT PELVIS W/O CM
4 of 16 series · 10 of 46 positions shown, 17 images · non-contrast
Comparison: 11/11/2005
COMPARISON: None.

CT LUMBAR SPINE WITHOUT CONTRAST; CT OF THE BONY PELVIS WITHOUT
CONTRAST
CLINICAL DATA: Low back pain.  Bilateral hip pain.  Right Stanlley
total hip prosthesis.  Right hip soreness.

CT LUMBAR SPINE WITHOUT CONTRAST
TECHNIQUE: Multidetector CT imaging of the lumbar spine was
performed without intravenous contrast administration.  Multiplanar
CT image reconstructions were also generated.
TECHNIQUE: Multidetector CT imaging of the pelvis was performed
following the standard protocol without intravenous contrast.  Bony
pelvis and hip protocol were utilized.

[Series 7: hip soft · axial · 0.70mm/px · z∈[-456,-268]mm · 4 of 117 slices shown, 9 images]
[im 24/117  soft-tissue]
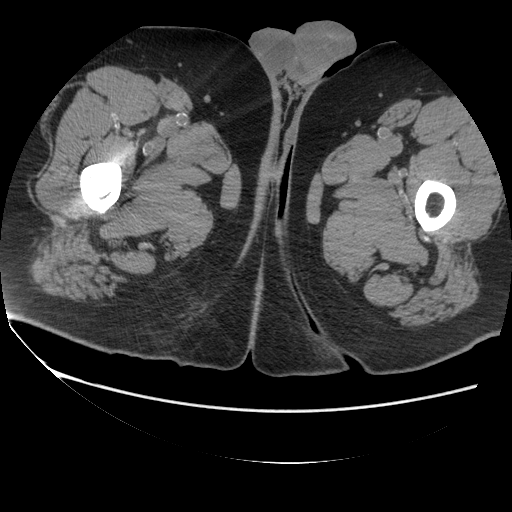
[im 24/117  lung]
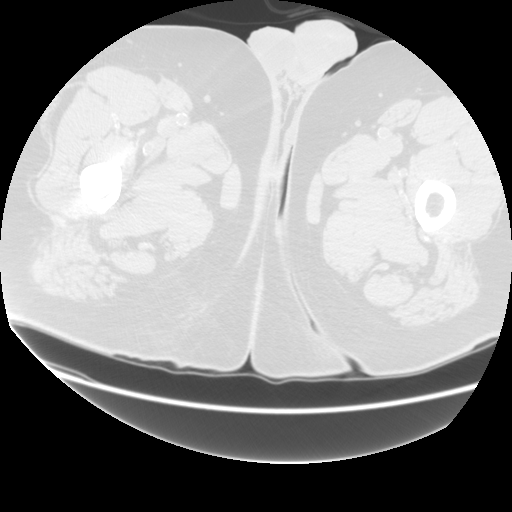
[im 24/117  bone]
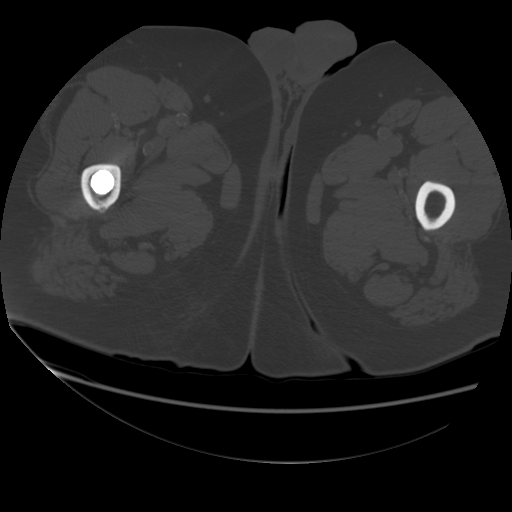
[im 47/117  soft-tissue]
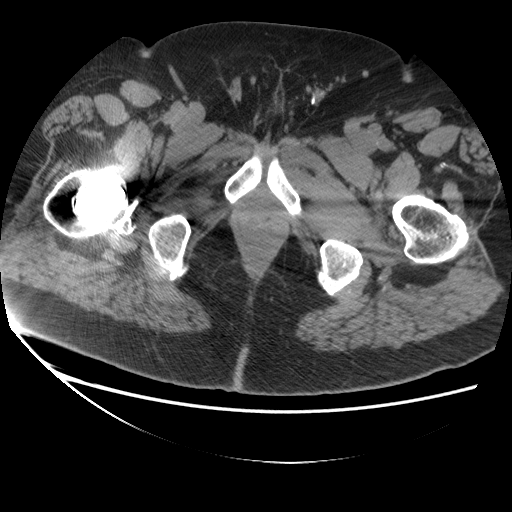
[im 47/117  lung]
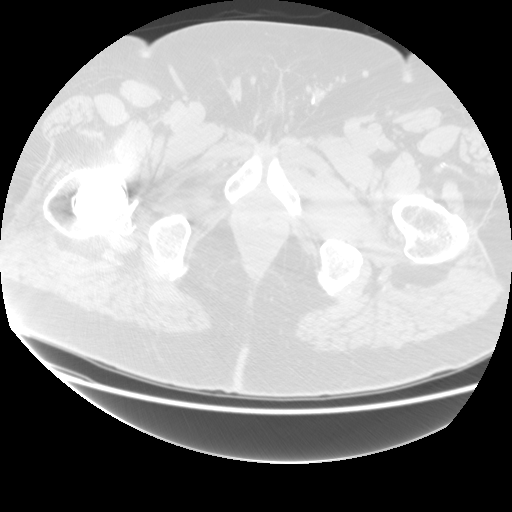
[im 70/117  soft-tissue]
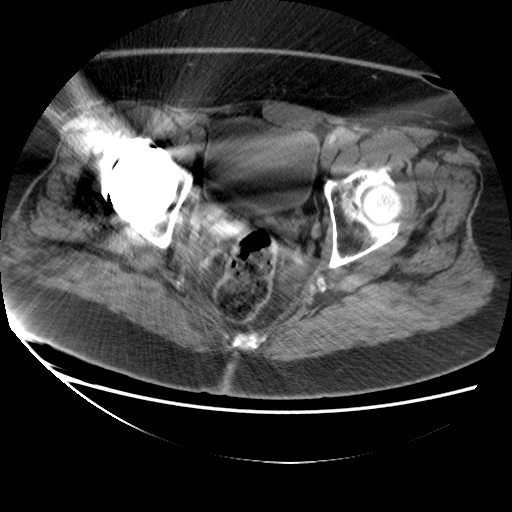
[im 70/117  lung]
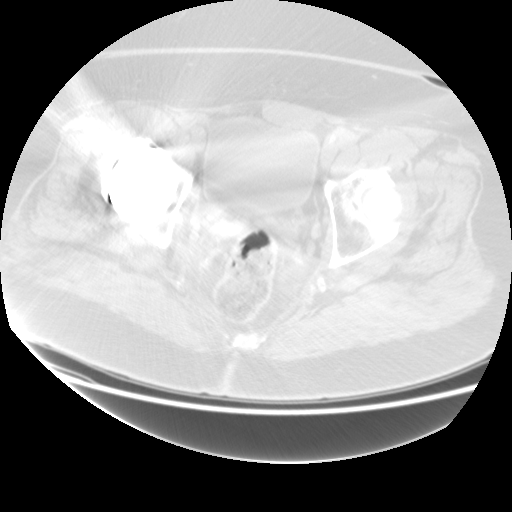
[im 93/117  soft-tissue]
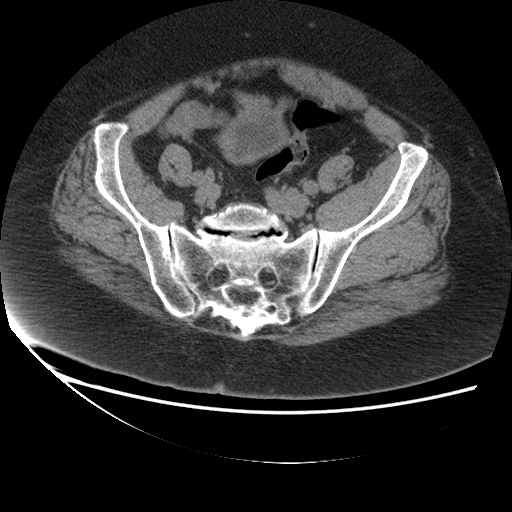
[im 93/117  lung]
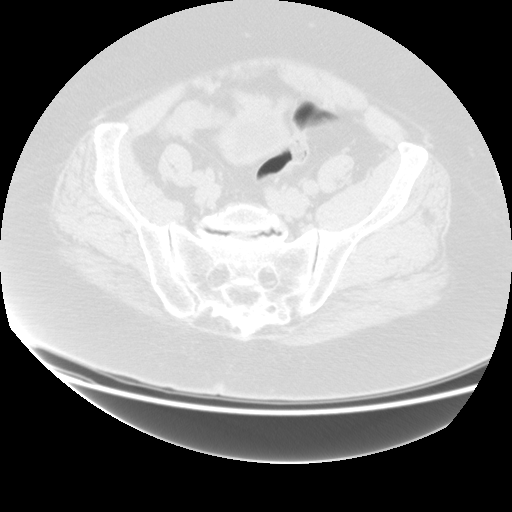

[Series 100: rt hip soft · axial · 0.43mm/px · z∈[-500,-335]mm · 4 of 110 slices shown]
[im 22/110  soft-tissue]
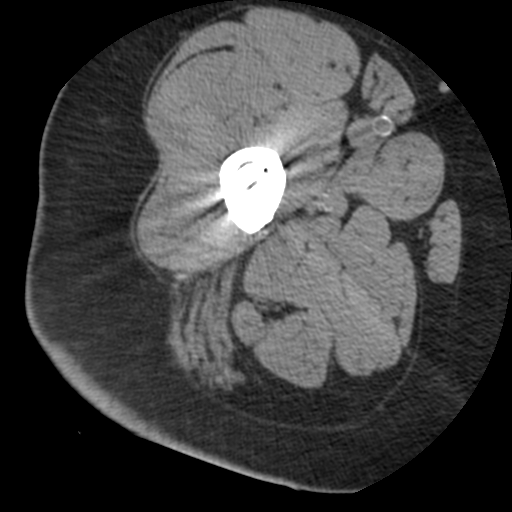
[im 44/110  soft-tissue]
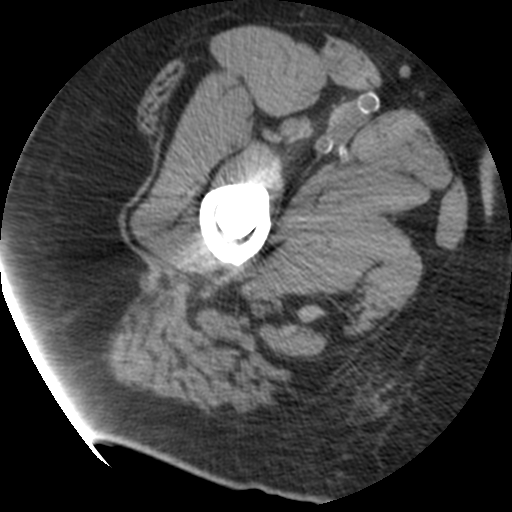
[im 66/110  soft-tissue]
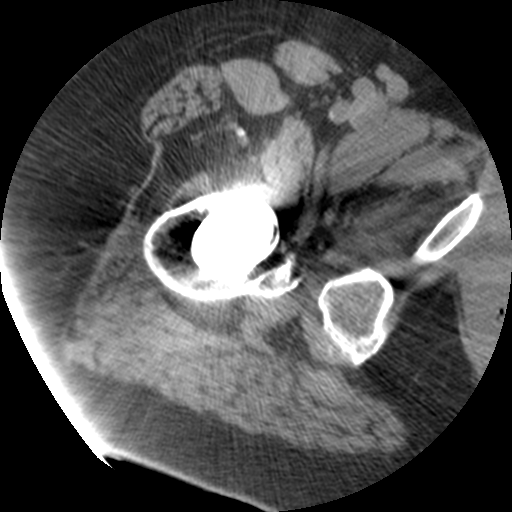
[im 88/110  soft-tissue]
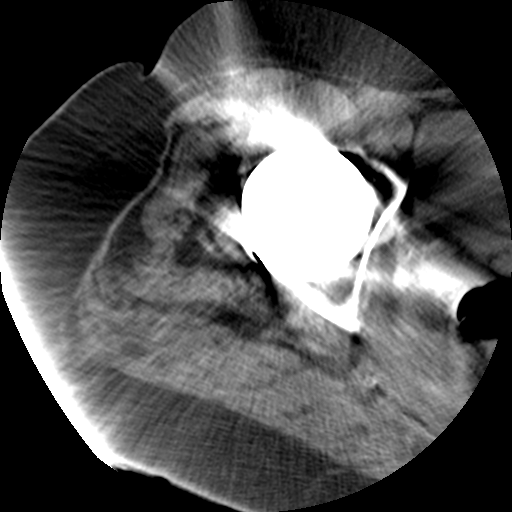

[Series 101: cor rt hip · coronal · 0.55mm/px · 1 of 63 slices shown, 2 images]
[im 32/63  soft-tissue]
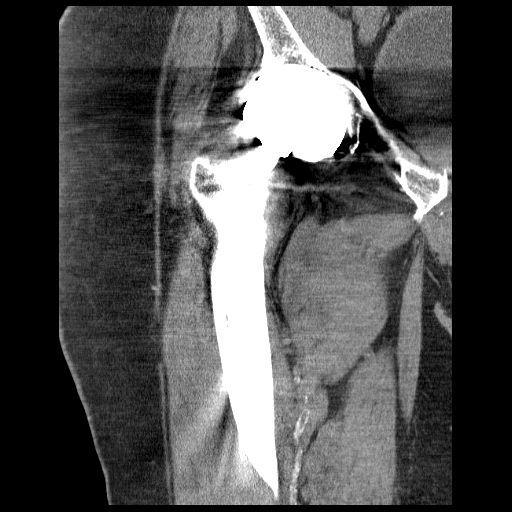
[im 32/63  bone]
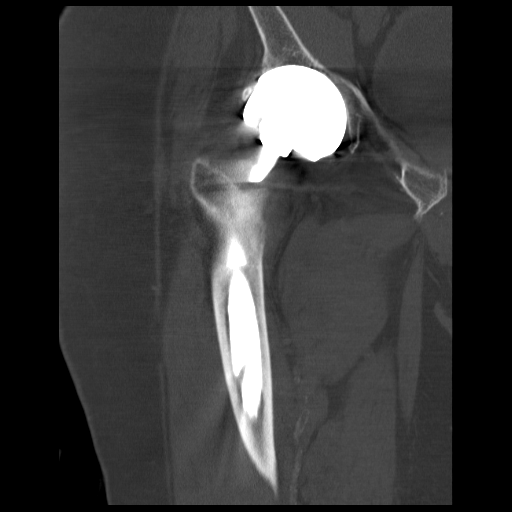

[Series 501: cor pelvis · sagittal · 0.70mm/px · 1 of 155 slices shown, 2 images]
[im 78/155  soft-tissue]
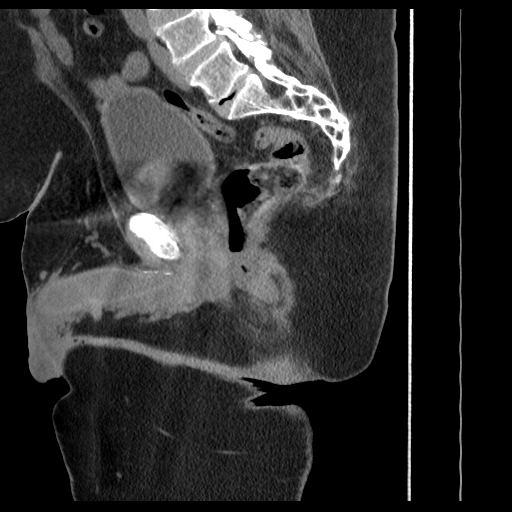
[im 78/155  bone]
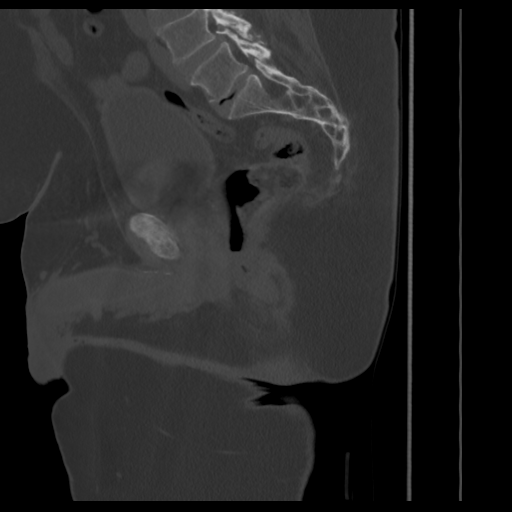

[10 of 46 positions shown; findings below may reference images not displayed]

FINDINGS: The lowest full intervertebral disk space is labeled L5-
S1.  If procedural intervention is to be performed, careful
correlation with this numbering strategy is recommended.

There is 2 mm of degenerative anterior subluxation of L4 on L5.  No
pars defects are observed.

Vacuum disc phenomenon noted at L5-S1 with vacuum facet phenomenon
at L3-4 and L4-5 bilaterally.  Pedicles in the lower lumbar spine
appear mildly congenitally short.

No lumbar compression fracture is observed.  Nitrogen gas
phenomenon is noted in both sacroiliac joints, and the broad left
transverse process of L5 pseudoarticulates with the S1 vertebra.

Additional findings at individual levels are as follows:

T12-L1:  Faint calcification along the posterior margin of the
intervertebral disc is shown on image 32 of series 202.  No
impingement.

L1-2:  Faint posterior annular calcification is noted at this
level, without impingement.

L2-3:  There may be a minimal disc bulge at this level.  Mild
bilateral facet arthropathy is noted.  No impingement is observed.

L3-4:  Considerable bilateral facet arthropathy noted with
subcortical cyst formation and articular surface irregularity along
the facets.  There may be a minimal disc bulge at this level,
although MRI is more suitable in assessing soft tissue structures.
Progressive ligament flavum redundancy is present and I suspect
mild central stenosis and potentially mild subarticular lateral
recess stenosis.

L4-5:  Bilateral facet arthropathy noted with a diffuse disc bulge
resulting in moderate central stenosis and moderate bilateral
subarticular lateral recess stenosis.

L5-S1:  Right greater than left facet arthropathy noted with mild
bilateral subarticular lateral recess stenosis.  The thecal sac is
tapered at this level.
IMPRESSION: 1.  Lumbar spondylosis and degenerative disc disease with, with
impingement at L3-4, L4-5, and L5-S1.

---

CT PELVIS WITHOUT CONTRAST
FINDINGS: Given the degree of urinary bladder distention, the
urinary bladder wall appears thickened, and cystitis or mild
chronic outlet obstruction cannot be excluded.  Correlate with
patient history.

Nitrogen gas phenomenon noted in both the sacroiliac joints.

There is chondrocalcinosis in the pubic symphysis.  Mild
irregularity of the anterior column of the left acetabulum may be
from motion artifact.

No sciatic notch impingement is observed.

No findings of loosening or infection along the stem of the implant

Atherosclerotic arterial calcification noted.

Prominent spurring is noted adjacent the greater trochanter.  I do
not observe a definite abnormal fluid collection or soft tissue
density in the vicinity of the right hip.
IMPRESSION: 1.  No definite complicating feature related to the right-sided hip
implant is observed.
2.  Atherosclerosis.
3.  Nitrogen gas phenomenon and both sacroiliac joints.
4.  Chondrocalcinosis in the pubic symphysis.

## 2012-06-25 ENCOUNTER — Other Ambulatory Visit: Payer: Self-pay | Admitting: Internal Medicine

## 2012-08-06 ENCOUNTER — Ambulatory Visit (INDEPENDENT_AMBULATORY_CARE_PROVIDER_SITE_OTHER): Payer: Medicare Other | Admitting: *Deleted

## 2012-08-06 ENCOUNTER — Other Ambulatory Visit: Payer: Self-pay | Admitting: Internal Medicine

## 2012-08-06 ENCOUNTER — Encounter: Payer: Self-pay | Admitting: Internal Medicine

## 2012-08-06 DIAGNOSIS — I442 Atrioventricular block, complete: Secondary | ICD-10-CM

## 2012-08-06 DIAGNOSIS — Z950 Presence of cardiac pacemaker: Secondary | ICD-10-CM

## 2012-08-08 ENCOUNTER — Other Ambulatory Visit: Payer: Self-pay | Admitting: *Deleted

## 2012-08-08 LAB — REMOTE PACEMAKER DEVICE
AL AMPLITUDE: 1.8 mv
AL THRESHOLD: 0.875 V
ATRIAL PACING PM: 1
BAMS-0001: 150 {beats}/min
BAMS-0003: 70 {beats}/min
BATTERY VOLTAGE: 7.3 V
DEVICE MODEL PM: 7192434
RV LEAD THRESHOLD: 0.875 V

## 2012-08-08 MED ORDER — INSULIN NPH ISOPHANE & REGULAR (70-30) 100 UNIT/ML ~~LOC~~ SUSP: SUBCUTANEOUS | Status: DC

## 2012-08-08 MED ORDER — INSULIN PEN NEEDLE 29G X 12.7MM MISC: Status: DC

## 2012-08-08 MED ORDER — "PEN NEEDLES 5/16"" 31G X 8 MM MISC": 1.0000 "pen " | Freq: Every day | Status: DC

## 2012-08-28 ENCOUNTER — Encounter: Payer: Self-pay | Admitting: *Deleted

## 2012-09-05 ENCOUNTER — Other Ambulatory Visit: Payer: Self-pay

## 2012-09-05 MED ORDER — INSULIN NPH ISOPHANE & REGULAR (70-30) 100 UNIT/ML ~~LOC~~ SUSP: SUBCUTANEOUS | Status: DC

## 2012-09-27 ENCOUNTER — Other Ambulatory Visit: Payer: Self-pay

## 2012-09-27 MED ORDER — INSULIN NPH ISOPHANE & REGULAR (70-30) 100 UNIT/ML ~~LOC~~ SUSP: SUBCUTANEOUS | Status: DC

## 2012-10-01 ENCOUNTER — Other Ambulatory Visit: Payer: Self-pay | Admitting: Internal Medicine

## 2012-10-01 ENCOUNTER — Telehealth: Payer: Self-pay

## 2012-10-01 MED ORDER — GLUCOSE BLOOD VI STRP: ORAL_STRIP | Status: DC

## 2012-10-01 NOTE — Telephone Encounter (Signed)
Pt calls stating the insulin sent to his pharmacy was wrong. It should be Humulin 70/30 vial (inject 65 units). Novolin 70/30 what the pharmacy has. Is this change okay?

## 2012-10-01 NOTE — Telephone Encounter (Signed)
Pt notified switch okay and pharmacy notified as well

## 2012-10-01 NOTE — Telephone Encounter (Signed)
yes

## 2012-10-03 ENCOUNTER — Other Ambulatory Visit: Payer: Self-pay | Admitting: Internal Medicine

## 2012-11-12 ENCOUNTER — Encounter: Payer: Self-pay | Admitting: Internal Medicine

## 2012-11-12 ENCOUNTER — Ambulatory Visit (INDEPENDENT_AMBULATORY_CARE_PROVIDER_SITE_OTHER): Payer: Medicare FFS | Admitting: *Deleted

## 2012-11-12 DIAGNOSIS — Z950 Presence of cardiac pacemaker: Secondary | ICD-10-CM

## 2012-11-12 DIAGNOSIS — I442 Atrioventricular block, complete: Secondary | ICD-10-CM

## 2012-11-14 LAB — REMOTE PACEMAKER DEVICE
AL IMPEDENCE PM: 480 Ohm
ATRIAL PACING PM: 1
BATTERY VOLTAGE: 2.95 V
DEVICE MODEL PM: 7192434
RV LEAD IMPEDENCE PM: 440 Ohm
RV LEAD THRESHOLD: 0.75 V
VENTRICULAR PACING PM: 100

## 2012-11-20 ENCOUNTER — Encounter: Payer: Self-pay | Admitting: *Deleted

## 2012-12-13 ENCOUNTER — Other Ambulatory Visit: Payer: Self-pay | Admitting: Internal Medicine

## 2012-12-26 ENCOUNTER — Other Ambulatory Visit: Payer: Self-pay | Admitting: Internal Medicine

## 2012-12-27 ENCOUNTER — Other Ambulatory Visit: Payer: Self-pay | Admitting: Cardiology

## 2012-12-27 MED ORDER — FUROSEMIDE 40 MG PO TABS: ORAL_TABLET | ORAL | Status: DC

## 2013-01-03 ENCOUNTER — Encounter: Payer: Self-pay | Admitting: Internal Medicine

## 2013-01-08 ENCOUNTER — Telehealth: Payer: Self-pay | Admitting: Internal Medicine

## 2013-01-08 NOTE — Telephone Encounter (Signed)
Wife states Dr. Debby Bud was suppose to get Mr. Russomanno on his schedule before sept 3rd.  Please give a call in this regard.

## 2013-01-15 ENCOUNTER — Ambulatory Visit (INDEPENDENT_AMBULATORY_CARE_PROVIDER_SITE_OTHER): Payer: Medicare FFS | Admitting: Internal Medicine

## 2013-01-15 ENCOUNTER — Encounter: Payer: Self-pay | Admitting: Internal Medicine

## 2013-01-15 ENCOUNTER — Other Ambulatory Visit (INDEPENDENT_AMBULATORY_CARE_PROVIDER_SITE_OTHER): Payer: Medicare FFS

## 2013-01-15 VITALS — BP 110/70 | HR 78 | Temp 97.9°F | Wt 273.0 lb

## 2013-01-15 DIAGNOSIS — Z950 Presence of cardiac pacemaker: Secondary | ICD-10-CM

## 2013-01-15 DIAGNOSIS — N182 Chronic kidney disease, stage 2 (mild): Secondary | ICD-10-CM

## 2013-01-15 DIAGNOSIS — E109 Type 1 diabetes mellitus without complications: Secondary | ICD-10-CM

## 2013-01-15 DIAGNOSIS — Z0000 Encounter for general adult medical examination without abnormal findings: Secondary | ICD-10-CM

## 2013-01-15 DIAGNOSIS — I5033 Acute on chronic diastolic (congestive) heart failure: Secondary | ICD-10-CM

## 2013-01-15 DIAGNOSIS — E785 Hyperlipidemia, unspecified: Secondary | ICD-10-CM

## 2013-01-15 DIAGNOSIS — I10 Essential (primary) hypertension: Secondary | ICD-10-CM

## 2013-01-15 DIAGNOSIS — Z23 Encounter for immunization: Secondary | ICD-10-CM

## 2013-01-15 DIAGNOSIS — I509 Heart failure, unspecified: Secondary | ICD-10-CM

## 2013-01-15 LAB — HEPATIC FUNCTION PANEL
Alkaline Phosphatase: 81 U/L (ref 39–117)
Bilirubin, Direct: 0.1 mg/dL (ref 0.0–0.3)
Total Bilirubin: 0.9 mg/dL (ref 0.3–1.2)

## 2013-01-15 LAB — LIPID PANEL
Cholesterol: 163 mg/dL (ref 0–200)
HDL: 47.3 mg/dL (ref >39.00)
Triglycerides: 73 mg/dL (ref 0.0–149.0)
VLDL: 14.6 mg/dL (ref 0.0–40.0)

## 2013-01-15 LAB — COMPREHENSIVE METABOLIC PANEL
BUN: 47 mg/dL — ABNORMAL HIGH (ref 6–23)
CO2: 25 mEq/L (ref 19–32)
Calcium: 9.4 mg/dL (ref 8.4–10.5)
Chloride: 102 mEq/L (ref 96–112)
Creatinine, Ser: 2.1 mg/dL — ABNORMAL HIGH (ref 0.4–1.5)
GFR: 39.69 mL/min — ABNORMAL LOW (ref >60.00)
Glucose, Bld: 70 mg/dL (ref 70–99)

## 2013-01-15 LAB — HEMOGLOBIN AND HEMATOCRIT, BLOOD: Hemoglobin: 14.8 g/dL (ref 13.0–17.0)

## 2013-01-15 LAB — HEMOGLOBIN A1C: Hgb A1c MFr Bld: 8.2 % — ABNORMAL HIGH (ref 4.6–6.5)

## 2013-01-15 NOTE — Progress Notes (Signed)
Subjective:    Patient ID: Connor Dixon, male    DOB: 07/09/1934, 78 y.o.   MRN: [id number]  HPI The patient is here for annual Medicare wellness examination and management of other chronic and acute problems.  Interval history has been unremarkable- but he has remained poorly controlled as a diabetic. He has been medically stable. No major illness or hospitalization, no surgery, no injury.    The risk factors are reflected in the social history.  The roster of all physicians providing medical care to patient - is listed in the Snapshot section of the chart.  Activities of daily living:  The patient is 100% inedpendent in all ADLs: dressing, toileting, feeding as well as independent mobility  Home safety : The patient has smoke detectors in the home. Falls - none. They wear seatbelts. firearms are present in the home, kept in a safe fashion. There is no violence in the home.   There is no risks for hepatitis, STDs or HIV. There is no history of blood transfusion. They have no travel history to infectious disease endemic areas of the world.  The patient has seen their dentist in the last six month. They have seen their eye doctor in the last year. They deny any hearing difficulty and have not had audiologic testing in the last year.    They do not  have excessive sun exposure. Discussed the need for sun protection: hats, long sleeves and use of sunscreen if there is significant sun exposure.   Diet: the importance of a healthy diet is discussed. They do have a unhealthy-high fat/fast food diet.  The patient has no regular exercise program.  The benefits of regular aerobic exercise were discussed.  Depression screen: there are no signs or vegative symptoms of depression- irritability, change in appetite, anhedonia, sadness/tearfullness.  Cognitive assessment: the patient manages all their financial and personal affairs and is actively engaged.   The following portions of the patient's  history were reviewed and updated as appropriate: allergies, current medications, past family history, past medical history,  past surgical history, past social history  and problem list.  Past Medical History  Diagnosis Date  . Atrioventricular block, complete   . Hypopotassemia   . Other and unspecified angina pectoris     typical  . Chronic kidney disease, stage II (mild)   . Arthus phenomenon   . Morbid obesity   . HTN (hypertension)     essential  . HLD (hyperlipidemia)   . DM (diabetes mellitus)     adult onset   . Cardiac pacemaker in situ 07/22/2010  . DIABETES MELLITUS, TYPE I, ADULT ONSET 08/06/2007  . ESSENTIAL HYPERTENSION 08/06/2007  . HYPERLIPIDEMIA 08/06/2007  . Obesity    Past Surgical History  Procedure Laterality Date  . Total hip arthroplasty  04/05/06    right  . Cataract extraction  8-9/08    [facility]  . Ptvdp  12/11    PPM - St. Jude   Family History  Problem Relation Age of Onset  . Colon cancer Neg Hx     no definate prostate CA   . Asthma Father   . Coronary artery disease Father   . Diabetes Father   . Hyperlipidemia Father   . Coronary artery disease Mother   . Asthma Mother   . Heart attack Mother 26  . Stroke Father 52   History   Social History  . Marital Status: Married    Spouse Name: N/A  Number of Children: N/A  . Years of Education: N/A   Occupational History  . Not on file.   Social History Main Topics  . Smoking status: Never Smoker   . Smokeless tobacco: Not on file  . Alcohol Use: Yes  . Drug Use: No  . Sexual Activity: Not on file   Other Topics Concern  . Not on file   Social History Narrative   [employer] BA, 2 Master's degrees. [place] - for certification Education specialist.    Retired Animal nutritionist.   Active - travels.   Married - 1961; 2 sons (1962 and 24) and 5 grandchildren.        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     Current Outpatient Prescriptions on File Prior to  Visit  Medication Sig Dispense Refill  . aspirin 81 MG tablet Take 81 mg by mouth daily.        . B-D INS SYRINGE 0.5CC/30GX1/2" 30G X 1/2" 0.5 ML MISC USE AS DIRECTED TO INJECT LANTUS AND HUMULIN UP TO 4 TIMES A DAY.  100 each  3  . Blood Glucose Monitoring Suppl (FREESTYLE LITE) DEVI Dx:250.01 Test tid  1 each  1  . carvedilol (COREG) 6.25 MG tablet TAKE ONE TABLET TWICE DAILY  60 tablet  11  . enalapril (VASOTEC) 20 MG tablet Take 1 tablet (20 mg total) by mouth 2 (two) times daily.  180 tablet  6  . furosemide (LASIX) 40 MG tablet Take 1 tablet in the morning and 1/2 tablet at 1:00 pm.  135 tablet  3  . glucose blood (FREESTYLE LITE) test strip Use as instructed  150 each  3  . insulin NPH-regular (NOVOLIN 70/30) (70-30) 100 UNIT/ML injection INJECT 65 UNITS AS DIRECTED  60 mL  1  . Insulin Pen Needle 29G X 12.7MM MISC BD Pen Needle Ultrafine. Use as Directed with Insulin Pens.  100 each  5  . Lancets (FREESTYLE) lancets TEST BLOOD GLUCOSE AS    DIRECTED  100 each  4  . LANTUS 100 UNIT/ML injection INJECT 20 UNITS UNDER THE SKIN EACH NIGHT AS DIRECTED BY YOUR PHYSICIAN  10 mL  3  . Misc Natural Products (OSTEO BI-FLEX TRIPLE STRENGTH) TABS Take 2 tablets by mouth daily.       . pravastatin (PRAVACHOL) 40 MG tablet TAKE ONE (1) TABLET EACH DAY  90 tablet  3   No current facility-administered medications on file prior to visit.   Current Outpatient Prescriptions on File Prior to Visit  Medication Sig Dispense Refill  . aspirin 81 MG tablet Take 81 mg by mouth daily.        . B-D INS SYRINGE 0.5CC/30GX1/2" 30G X 1/2" 0.5 ML MISC USE AS DIRECTED TO INJECT LANTUS AND HUMULIN UP TO 4 TIMES A DAY.  100 each  3  . Blood Glucose Monitoring Suppl (FREESTYLE LITE) DEVI Dx:250.01 Test tid  1 each  1  . carvedilol (COREG) 6.25 MG tablet TAKE ONE TABLET TWICE DAILY  60 tablet  11  . enalapril (VASOTEC) 20 MG tablet Take 1 tablet (20 mg total) by mouth 2 (two) times daily.  180 tablet  6  . furosemide  (LASIX) 40 MG tablet Take 1 tablet in the morning and 1/2 tablet at 1:00 pm.  135 tablet  3  . glucose blood (FREESTYLE LITE) test strip Use as instructed  150 each  3  . insulin NPH-regular (NOVOLIN 70/30) (70-30) 100 UNIT/ML injection INJECT 65 UNITS  AS DIRECTED  60 mL  1  . Insulin Pen Needle 29G X 12.7MM MISC BD Pen Needle Ultrafine. Use as Directed with Insulin Pens.  100 each  5  . Lancets (FREESTYLE) lancets TEST BLOOD GLUCOSE AS    DIRECTED  100 each  4  . LANTUS 100 UNIT/ML injection INJECT 20 UNITS UNDER THE SKIN EACH NIGHT AS DIRECTED BY YOUR PHYSICIAN  10 mL  3  . Misc Natural Products (OSTEO BI-FLEX TRIPLE STRENGTH) TABS Take 2 tablets by mouth daily.       . pravastatin (PRAVACHOL) 40 MG tablet TAKE ONE (1) TABLET EACH DAY  90 tablet  3   No current facility-administered medications on file prior to visit.    Vision, hearing, body mass index were assessed and reviewed.   During the course of the visit the patient was educated and counseled about appropriate screening and preventive services including : fall prevention , diabetes screening, nutrition counseling, colorectal cancer screening, and recommended immunizations.    Review of Systems Constitutional:  Negative for fever, chills, activity change and unexpected weight change.  HEENT:  Negative for hearing loss, ear pain, congestion, neck stiffness and postnasal drip. Negative for sore throat or swallowing problems. Negative for dental complaints.   Eyes: Negative for vision loss or change in visual acuity.  Respiratory: Negative for chest tightness and wheezing. Negative for DOE.   Cardiovascular: Negative for chest pain or palpitations. No decreased exercise tolerance Gastrointestinal: No change in bowel habit. No bloating or gas. No reflux or indigestion Genitourinary: Negative for urgency, frequency, flank pain and difficulty urinating.  Musculoskeletal: Negative for myalgias, back pain, arthralgias and gait problem.   Neurological: Negative for dizziness, tremors, weakness and headaches.  Hematological: Negative for adenopathy.  Psychiatric/Behavioral: Negative for behavioral problems and dysphoric mood.       Objective:   Physical Exam Filed Vitals:   01/15/13 1104  BP: 110/70  Pulse: 78  Temp: 97.9 F (36.6 C)   Wt Readings from Last 3 Encounters:  01/15/13 273 lb (123.832 kg)  05/02/12 273 lb (123.832 kg)  01/09/12 270 lb (122.471 kg)   Gen'l: Well nourished obese well developed male in no acute distress  HEENT: Head: Normocephalic and atraumatic. Right Ear: External ear normal. EAC/TM nl. Left Ear: External ear normal.  EAC/TM nl. Nose: Nose normal. Mouth/Throat: Oropharynx is clear and moist. Dentition - native, in good repair. No buccal or palatal lesions. Posterior pharynx clear. Eyes: Conjunctivae and sclera clear. EOM intact. Pupils are equal, round, and reactive to light. Right eye exhibits no discharge. Left eye exhibits no discharge. Neck: Normal range of motion. Neck supple. No JVD present. No tracheal deviation present. No thyromegaly present.  Cardiovascular: Normal rate, regular rhythm, no gallop, no friction rub, no murmur heard.      Quiet precordium. 2+ radial and DP pulses . No carotid bruits Pulmonary/Chest: Effort normal. No respiratory distress or increased WOB, no wheezes, no rales. No chest wall deformity or CVAT. PTVDP left anterior chest Abdomen: Soft. Bowel sounds are normal in all quadrants. He exhibits no distension, no tenderness, no rebound or guarding, No heptosplenomegaly  Genitourinary:  deferred to age Musculoskeletal: Normal range of motion. He exhibits no edema and no tenderness.       Small and large joints without redness, synovial thickening or deformity. Full range of motion preserved about all small, median and large joints.  Lymphadenopathy:    He has no cervical or supraclavicular adenopathy.  Neurological: He is alert  and oriented to person, place,  and time. CN II-XII intact. DTRs 2+ and symmetrical biceps, radial and patellar tendons. Cerebellar function normal with no tremor, rigidity, normal gait and station. see diabetic foot exam.  Skin: Skin is warm and dry. No rash noted. No erythema.  Psychiatric: He has a normal mood and affect. His behavior is normal. Thought content normal.   Recent Results (from the past 2160 hour(s))  REMOTE PACEMAKER DEVICE     Status: None   Collection Time    11/12/12  9:52 AM      Result Value Range   DEVICE MODEL PM [id number]     [id number] Lewayne Bunting   M.D.     [id number] Lewayne Bunting   M.D.     [id number] [employer] Medical Monitoring System     [phone]       Value: Pacemaker remote check. Device function reviewed. Impedance, sensing, auto capture thresholds consistent with previous measurements. Histograms appropriate for patient and level of activity. All other diagnostic data reviewed and is appropriate and      stable for patient. Real time/magnet EGM shows appropriate sensing and capture. 16 mode switches, <1%, shows atrial tachy.  No ventricular high rate episodes. Estimated longevity 7.4 years. Plan to follow in 3 months remotely, to see in office annually.       Next remote 02/18/13.   ATRIAL PACING PM 1     VENTRICULAR PACING PM 100     BATTERY VOLTAGE 2.95     AL IMPEDENCE PM 480     RV LEAD IMPEDENCE PM 440     AL AMPLITUDE 2.2     AL THRESHOLD 0.75     RV LEAD THRESHOLD 0.75     BAMS-0001 150     BAMS-0003 70    HEMOGLOBIN A1C     Status: Abnormal   Collection Time    01/15/13 12:43 PM      Result Value Range   Hemoglobin A1C 8.2 (*) 4.6 - 6.5 %   Comment: Glycemic Control Guidelines for People with Diabetes:Non Diabetic:  <6%Goal of Therapy: <7%Additional Action Suggested:  >8%   HEPATIC FUNCTION PANEL     Status: None   Collection Time    01/15/13 12:43 PM      Result Value Range   Total Bilirubin 0.9  0.3 - 1.2 mg/dL   Bilirubin, Direct 0.1  0.0 - 0.3 mg/dL    Alkaline Phosphatase 81  39 - 117 U/L   AST 22  0 - 37 U/L   ALT 18  0 - 53 U/L   Total Protein 7.7  6.0 - 8.3 g/dL   Albumin 4.2  3.5 - 5.2 g/dL  COMPREHENSIVE METABOLIC PANEL     Status: Abnormal   Collection Time    01/15/13 12:43 PM      Result Value Range   Sodium 135  135 - 145 mEq/L   Potassium 4.9  3.5 - 5.1 mEq/L   Chloride 102  96 - 112 mEq/L   CO2 25  19 - 32 mEq/L   Glucose, Bld 70  70 - 99 mg/dL   BUN 47 (*) 6 - 23 mg/dL   Creatinine, Ser 2.1 (*) 0.4 - 1.5 mg/dL   Total Bilirubin 0.9  0.3 - 1.2 mg/dL   Alkaline Phosphatase 81  39 - 117 U/L   AST 22  0 - 37 U/L   ALT 18  0 - 53 U/L   Total Protein 7.7  6.0 - 8.3 g/dL   Albumin 4.2  3.5 - 5.2 g/dL   Calcium 9.4  8.4 - 16.1 mg/dL   GFR 09.60 (*) >45.40 mL/min  LIPID PANEL     Status: Abnormal   Collection Time    01/15/13 12:43 PM      Result Value Range   Cholesterol 163  0 - 200 mg/dL   Comment: ATP III Classification       Desirable:  < 200 mg/dL               Borderline High:  200 - 239 mg/dL          High:  > = 981 mg/dL   Triglycerides 19.1  0.0 - 149.0 mg/dL   Comment: Normal:  <478 mg/dLBorderline High:  150 - 199 mg/dL   HDL 29.56  >21.30 mg/dL   VLDL 86.5  0.0 - 78.4 mg/dL   LDL Cholesterol 696 (*) 0 - 99 mg/dL   Total CHOL/HDL Ratio 3     Comment:                Men          Women1/2 Average Risk     3.4          3.3Average Risk          5.0          4.42X Average Risk          9.6          7.13X Average Risk          15.0          11.0                      HEMOGLOBIN AND HEMATOCRIT, BLOOD     Status: None   Collection Time    01/15/13 12:43 PM      Result Value Range   Hemoglobin 14.8  13.0 - 17.0 g/dL   HCT 29.5  28.4 - 13.2 %         Assessment & Plan:

## 2013-01-15 NOTE — Patient Instructions (Addendum)
Thanks for coming in.  Your health is your JOB!!! You need to go to work every day and take care of yourself.  Weight management: the greatest threat to your health and the underlying cause (or major factor) in your medical problems: diabetes, blood pressure, cholesterol and athersclerotic vascular disease. You need to develop an exercise program that is safe but effective, i.e. Water exercise, personal trainer. Use the silver sneakers program.  Diabetes - lab today. You will need to come in every 3 months until diabetes is under control.  Disparities are not a major factor in your longevity - getting you medical problems under control is!!  Please sign up for MyChart - tests, labs all get posted; you will have access to major parts of your health record; you can have HIPPA secure communication to me and all other providers within the [facility] health system.   Have a good trip to [place] and remember you are a good investment.

## 2013-01-16 ENCOUNTER — Ambulatory Visit: Payer: Medicare FFS | Admitting: Internal Medicine

## 2013-01-16 ENCOUNTER — Encounter: Payer: Self-pay | Admitting: Internal Medicine

## 2013-01-16 NOTE — Assessment & Plan Note (Signed)
Discussed the critical role of obesity in his metabolic syndrome and that weight reduction was key to management of these problems and maximizing longevity.  Plan Diet management: smart food choices, PORTION SIZE CONTROL, regular exercise. Goal - to loose 1-2 lbs.month. Target weight - 220 lbs ( 53 lb weight loss over 3-5 years)

## 2013-01-16 NOTE — Assessment & Plan Note (Signed)
Taking and tolerating medication. Lab reveals LDL close to goal of 100, HDL better than goal of 40+, LFTs normal.  Plan Continue current medication  Low fat diet  Regular exercise.

## 2013-01-16 NOTE — Assessment & Plan Note (Signed)
Stable and at his baseline. Last 2 D echo Sept '13 with an improvement in diastolic dysfunction.  Plan  Continued medical therapy  Work on life-style management

## 2013-01-16 NOTE — Assessment & Plan Note (Signed)
Last OV Dr. Ladona Ridgel Dec '13. Last remote PPM check June 30, '14  He is doing well. No report of syncope, near syncope or recurrent SOB.   Plan  to see Dr. Ladona Ridgel as instructed.

## 2013-01-16 NOTE — Assessment & Plan Note (Signed)
Lab Results  Component Value Date   HGBA1C 8.2* 01/15/2013   Continues to have poor control, just over the A1C threshold to adjust medications.   Plan  Increase Lantus to 25 units qHS  Continue present dose of 70/30  Life-style management

## 2013-01-16 NOTE — Assessment & Plan Note (Signed)
Interval history is unremarkable but labs reveal continued suboptimal control of diabetes and progressive CKD. Discussed with him his disease course and prognosis. Informed him that in his case it is not a case of racial disparity but life-style management. He is encouraged to make his health his JOB: strict dietary adherence and regular exercise. This is critical to his well being. Physical exam notable for weight. He is current with colorectal cancer screening, has aged out of prostate cancer screening. Immunizations are current except for shingles vaccine. Lab results reviewed.  In summary A nice man who is encouraged to take charge/ a more active role in managing his metabolic syndrome. He will need to be seen every 3 months until diabetes is under better control.

## 2013-01-16 NOTE — Assessment & Plan Note (Signed)
BP Readings from Last 3 Encounters:  01/15/13 110/70  05/02/12 122/82  01/09/12 112/72   Good control of BP on current regimen.

## 2013-01-16 NOTE — Assessment & Plan Note (Signed)
Progressive CKD - now III  Plan Risk modification.  Will refer to nephrology to establish for long term care

## 2013-01-31 ENCOUNTER — Other Ambulatory Visit: Payer: Self-pay | Admitting: Internal Medicine

## 2013-02-18 ENCOUNTER — Encounter: Payer: Self-pay | Admitting: Internal Medicine

## 2013-02-18 ENCOUNTER — Ambulatory Visit: Payer: Medicare FFS | Admitting: *Deleted

## 2013-02-19 LAB — REMOTE PACEMAKER DEVICE
AL AMPLITUDE: 1.6 mv
AL IMPEDENCE PM: 440 Ohm
AL THRESHOLD: 0.875 V
ATRIAL PACING PM: 1
BAMS-0003: 70 {beats}/min
BATTERY VOLTAGE: 2.96 V
DEVICE MODEL PM: 7192434
RV LEAD THRESHOLD: 1 V

## 2013-02-26 ENCOUNTER — Encounter: Payer: Self-pay | Admitting: *Deleted

## 2013-03-10 ENCOUNTER — Other Ambulatory Visit: Payer: Self-pay | Admitting: Internal Medicine

## 2013-03-13 ENCOUNTER — Encounter: Payer: Medicare FFS | Admitting: Internal Medicine

## 2013-03-20 ENCOUNTER — Other Ambulatory Visit: Payer: Self-pay | Admitting: Internal Medicine

## 2013-03-21 ENCOUNTER — Other Ambulatory Visit: Payer: Self-pay

## 2013-03-22 ENCOUNTER — Other Ambulatory Visit: Payer: Self-pay | Admitting: Internal Medicine

## 2013-04-01 ENCOUNTER — Ambulatory Visit: Payer: Medicare FFS | Admitting: Internal Medicine

## 2013-04-02 ENCOUNTER — Ambulatory Visit (INDEPENDENT_AMBULATORY_CARE_PROVIDER_SITE_OTHER): Payer: Medicare FFS | Admitting: Internal Medicine

## 2013-04-02 ENCOUNTER — Other Ambulatory Visit (INDEPENDENT_AMBULATORY_CARE_PROVIDER_SITE_OTHER): Payer: Medicare FFS

## 2013-04-02 ENCOUNTER — Encounter: Payer: Self-pay | Admitting: Internal Medicine

## 2013-04-02 VITALS — BP 110/78 | HR 79 | Temp 97.2°F | Wt 274.4 lb

## 2013-04-02 DIAGNOSIS — E785 Hyperlipidemia, unspecified: Secondary | ICD-10-CM

## 2013-04-02 DIAGNOSIS — E109 Type 1 diabetes mellitus without complications: Secondary | ICD-10-CM

## 2013-04-02 DIAGNOSIS — I10 Essential (primary) hypertension: Secondary | ICD-10-CM

## 2013-04-02 DIAGNOSIS — N182 Chronic kidney disease, stage 2 (mild): Secondary | ICD-10-CM

## 2013-04-02 DIAGNOSIS — Z23 Encounter for immunization: Secondary | ICD-10-CM

## 2013-04-02 DIAGNOSIS — I442 Atrioventricular block, complete: Secondary | ICD-10-CM

## 2013-04-02 LAB — COMPREHENSIVE METABOLIC PANEL
AST: 24 U/L (ref 0–37)
CO2: 28 mEq/L (ref 19–32)
Calcium: 9.5 mg/dL (ref 8.4–10.5)
Creatinine, Ser: 1.8 mg/dL — ABNORMAL HIGH (ref 0.4–1.5)
GFR: 46.49 mL/min — ABNORMAL LOW (ref >60.00)
Glucose, Bld: 67 mg/dL — ABNORMAL LOW (ref 70–99)
Potassium: 5.2 mEq/L — ABNORMAL HIGH (ref 3.5–5.1)
Total Bilirubin: 1 mg/dL (ref 0.3–1.2)
Total Protein: 7.5 g/dL (ref 6.0–8.3)

## 2013-04-02 LAB — HEMOGLOBIN A1C: Hgb A1c MFr Bld: 7.7 % — ABNORMAL HIGH (ref 4.6–6.5)

## 2013-04-02 NOTE — Patient Instructions (Signed)
I am pleased that you have taken the first step in diet control. It is now time to take the 2nd, but big step - a sugar free diet.  Will check the A1C today.  To bring you up to date on immunizations will give the Prevnar vaccine and you will be set for life - 15 years minimum.

## 2013-04-02 NOTE — Progress Notes (Signed)
Subjective:    Patient ID: Connor Dixon, male    DOB: 1935/02/21, 78 y.o.   MRN: [id number]  HPI Mr. Willet presents for routine follow up of diabetes - last visit in September with an A1C of 8.2%. He has also been instructed to loose weight - he has taken the first step by stopping diet drinks. He has cut back but not given up sugar.   He is stable from a cardiac perspective with PPM in place.  Past Medical History  Diagnosis Date  . Atrioventricular block, complete   . Hypopotassemia   . Other and unspecified angina pectoris     typical  . Chronic kidney disease, stage II (mild)   . Arthus phenomenon   . Morbid obesity   . HTN (hypertension)     essential  . HLD (hyperlipidemia)   . DM (diabetes mellitus)     adult onset   . Cardiac pacemaker in situ 07/22/2010  . DIABETES MELLITUS, TYPE I, ADULT ONSET 08/06/2007  . ESSENTIAL HYPERTENSION 08/06/2007  . HYPERLIPIDEMIA 08/06/2007  . Obesity    Past Surgical History  Procedure Laterality Date  . Total hip arthroplasty  04/05/06    right  . Cataract extraction  8-9/08    [facility]  . Ptvdp  12/11    PPM - St. Jude   Family History  Problem Relation Age of Onset  . Colon cancer Neg Hx     no definate prostate CA   . Asthma Father   . Coronary artery disease Father   . Diabetes Father   . Hyperlipidemia Father   . Coronary artery disease Mother   . Asthma Mother   . Heart attack Mother 62  . Stroke Father 45   History   Social History  . Marital Status: Married    Spouse Name: N/A    Number of Children: N/A  . Years of Education: N/A   Occupational History  . Not on file.   Social History Main Topics  . Smoking status: Never Smoker   . Smokeless tobacco: Not on file  . Alcohol Use: Yes  . Drug Use: No  . Sexual Activity: Not on file   Other Topics Concern  . Not on file   Social History Narrative   [employer] BA, 2 Master's degrees. [place] - for certification Education specialist.    Retired Biomedical scientist.   Active - travels.   Married - 1961; 2 sons (1962 and 67) and 5 grandchildren.        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     Current Outpatient Prescriptions on File Prior to Visit  Medication Sig Dispense Refill  . aspirin 81 MG tablet Take 81 mg by mouth daily.        . B-D INS SYRINGE 0.5CC/30GX1/2" 30G X 1/2" 0.5 ML MISC USE AS DIRECTED TO INJECT LANTUS AND HUMULIN UP TO 4 TIMES A DAY.  100 each  3  . Blood Glucose Monitoring Suppl (FREESTYLE LITE) DEVI Dx:250.01 Test tid  1 each  1  . carvedilol (COREG) 6.25 MG tablet TAKE ONE TABLET TWICE DAILY  60 tablet  11  . enalapril (VASOTEC) 20 MG tablet TAKE ONE TABLET TWICE DAILY  180 tablet  5  . FREESTYLE LITE test strip TEST 5 TIMES DAILY AS DIRECTED  150 each  3  . furosemide (LASIX) 40 MG tablet Take 1 tablet in the morning and 1/2 tablet at 1:00 pm.  135 tablet  3  . insulin NPH-regular (NOVOLIN 70/30) (70-30) 100 UNIT/ML injection INJECT 65 UNITS AS DIRECTED  60 mL  1  . Insulin Pen Needle 29G X 12.7MM MISC BD Pen Needle Ultrafine. Use as Directed with Insulin Pens.  100 each  5  . Lancets (FREESTYLE) lancets TEST BLOOD GLUCOSE AS    DIRECTED  100 each  4  . LANTUS 100 UNIT/ML injection INJECT 20 UNITS UNDER THE SKIN EACH NIGHT AS DIRECTED BY YOUR PHYSICIAN  10 mL  3  . LANTUS SOLOSTAR 100 UNIT/ML SOPN INJECT 20 UNITS UNDER THE SKIN AT NIGHT AS DIRECTED BY YOUR PHYSICIAN  30 mL  5  . Misc Natural Products (OSTEO BI-FLEX TRIPLE STRENGTH) TABS Take 2 tablets by mouth daily.       . pravastatin (PRAVACHOL) 40 MG tablet TAKE ONE (1) TABLET EACH DAY  90 tablet  3   No current facility-administered medications on file prior to visit.      Review of Systems Constitutional:  Negative for fever, chills, activity change and unexpected weight change.  HEENT:  Negative for hearing loss, ear pain, congestion, neck stiffness and postnasal drip. Negative for sore throat or swallowing problems. Negative for  dental complaints.   Eyes: Negative for vision loss or change in visual acuity.  Respiratory: Negative for chest tightness and wheezing. Negative for DOE.   Cardiovascular: Negative for chest pain or palpitations. No decreased exercise tolerance Gastrointestinal: No change in bowel habit. No bloating or gas. No reflux or indigestion Genitourinary: Negative for urgency, frequency, flank pain and difficulty urinating.  Musculoskeletal: Negative for myalgias, back pain, arthralgias and gait problem.  Neurological: Negative for dizziness, tremors, weakness and headaches.  Hematological: Negative for adenopathy.  Psychiatric/Behavioral: Negative for behavioral problems and dysphoric mood.       Objective:   Physical Exam Filed Vitals:   04/02/13 1056  BP: 110/78  Pulse: 79  Temp: 97.2 F (36.2 C)   Wt Readings from Last 3 Encounters:  04/02/13 274 lb 6.4 oz (124.467 kg)  01/15/13 273 lb (123.832 kg)  05/02/12 273 lb (123.832 kg)   BP Readings from Last 3 Encounters:  04/02/13 110/78  01/15/13 110/70  05/02/12 122/82   Gen'l- obese man in no distress Cor- 2+ radial, RRR Pulm - no SOB at rest. Abd- massively obese Neuro - A&O x 3,       Assessment & Plan:

## 2013-04-02 NOTE — Progress Notes (Signed)
Pre visit review using our clinic review tool, if applicable. No additional management support is needed unless otherwise documented below in the visit note. 

## 2013-04-03 NOTE — Assessment & Plan Note (Signed)
Taking and tolerating "STatin" therapy. Last LDL July '14 = 101. LFTs normal  Plan Continue present medication

## 2013-04-03 NOTE — Assessment & Plan Note (Signed)
Reemphasized the need for weight management - Diet management: smart food choices, PORTION SIZE CONTROL, regular exercise. Goal - to loose 1-2 lbs.month. Target weight - 200 lbs

## 2013-04-03 NOTE — Assessment & Plan Note (Signed)
Lab Results  Component Value Date   HGBA1C 7.7* 04/02/2013   Slight improvement in A1C. He is still eating sugar in his diet.  Plan No Sugar diet!!  Weight loss

## 2013-04-03 NOTE — Assessment & Plan Note (Signed)
Stable diabetic nephropathy

## 2013-04-03 NOTE — Assessment & Plan Note (Signed)
BP Readings from Last 3 Encounters:  04/02/13 110/78  01/15/13 110/70  05/02/12 122/82   Good control of BP on present medication

## 2013-04-03 NOTE — Assessment & Plan Note (Signed)
Stable and doing well. Reports last tele-trace was OK

## 2013-04-16 ENCOUNTER — Ambulatory Visit: Payer: Medicare FFS | Admitting: Internal Medicine

## 2013-04-29 ENCOUNTER — Other Ambulatory Visit: Payer: Self-pay | Admitting: Nephrology

## 2013-04-29 DIAGNOSIS — N182 Chronic kidney disease, stage 2 (mild): Secondary | ICD-10-CM

## 2013-04-30 ENCOUNTER — Other Ambulatory Visit: Payer: Self-pay

## 2013-04-30 MED ORDER — CARVEDILOL 6.25 MG PO TABS: ORAL_TABLET | ORAL | Status: DC

## 2013-05-01 ENCOUNTER — Encounter: Payer: Self-pay | Admitting: Internal Medicine

## 2013-05-02 ENCOUNTER — Encounter: Payer: Self-pay | Admitting: Internal Medicine

## 2013-05-02 ENCOUNTER — Ambulatory Visit (INDEPENDENT_AMBULATORY_CARE_PROVIDER_SITE_OTHER): Payer: Medicare FFS | Admitting: Internal Medicine

## 2013-05-02 ENCOUNTER — Ambulatory Visit
Admission: RE | Admit: 2013-05-02 | Discharge: 2013-05-02 | Disposition: A | Discharge status: Home / Self Care | Payer: Medicare FFS | Source: Ambulatory Visit | Attending: Nephrology | Admitting: Nephrology

## 2013-05-02 VITALS — BP 138/90 | HR 88 | Ht 70.0 in | Wt 238.0 lb

## 2013-05-02 DIAGNOSIS — Z950 Presence of cardiac pacemaker: Secondary | ICD-10-CM

## 2013-05-02 DIAGNOSIS — I442 Atrioventricular block, complete: Secondary | ICD-10-CM

## 2013-05-02 DIAGNOSIS — N182 Chronic kidney disease, stage 2 (mild): Secondary | ICD-10-CM

## 2013-05-02 LAB — MDC_IDC_ENUM_SESS_TYPE_INCLINIC
Battery Voltage: 2.95 V
Brady Statistic RA Percent Paced: 0.47 %
Brady Statistic RV Percent Paced: 99.06 %
Date Time Interrogation Session: 20141218111841
Implantable Pulse Generator Model: 2210
Implantable Pulse Generator Serial Number: 7192434
Lead Channel Impedance Value: 400 Ohm
Lead Channel Impedance Value: 450 Ohm
Lead Channel Pacing Threshold Amplitude: 0.75 V
Lead Channel Pacing Threshold Amplitude: 1 V
Lead Channel Pacing Threshold Pulse Width: 0.4 ms
Lead Channel Sensing Intrinsic Amplitude: 1.4 mV
Lead Channel Sensing Intrinsic Amplitude: 9.2 mV
Lead Channel Setting Pacing Amplitude: 1.75 V
Lead Channel Setting Pacing Pulse Width: 0.4 ms

## 2013-05-02 NOTE — Assessment & Plan Note (Addendum)
I discussed the importance of weight loss. We discussed if worse weight loss and exercise. He he will try to eat less.

## 2013-05-02 NOTE — Assessment & Plan Note (Signed)
His St. Jude DDD PM is working normally. Will recheck in several months. 

## 2013-05-02 NOTE — Progress Notes (Signed)
HPI Connor Dixon he is a very pleasant morbidly obese 78 year old man with a history of complete heart block, status post permanent pacemaker insertion. He also has a history of diabetes, hypertension, and peripheral edema. He remains active, working in his yard. No syncope. No chest pain or shortness of breath.  Allergies  Allergen Reactions  . Oysters [Shellfish Allergy]      Current Outpatient Prescriptions  Medication Sig Dispense Refill  . aspirin EC 81 MG tablet Take 81 mg by mouth daily.      . B-D INS SYRINGE 0.5CC/31GX5/16 31G X 5/16" 0.5 ML MISC As directed      . BD ULTRA-FINE PEN NEEDLES 29G X 12.7MM MISC As directed      . carvedilol (COREG) 6.25 MG tablet TAKE ONE TABLET TWICE DAILY  60 tablet  0  . enalapril (VASOTEC) 20 MG tablet TAKE ONE TABLET TWICE DAILY  180 tablet  5  . FREESTYLE LITE test strip As directed      . furosemide (LASIX) 40 MG tablet Take 1 tablet in the morning and 1/2 tablet at 1:00 pm.  135 tablet  3  . insulin NPH-regular (NOVOLIN 70/30) (70-30) 100 UNIT/ML injection INJECT 65 UNITS AS DIRECTED  60 mL  1  . LANTUS 100 UNIT/ML injection INJECT 20 UNITS UNDER THE SKIN EACH NIGHT AS DIRECTED BY YOUR PHYSICIAN  10 mL  3  . LANTUS SOLOSTAR 100 UNIT/ML SOPN INJECT 20 UNITS UNDER THE SKIN AT NIGHT AS DIRECTED BY YOUR PHYSICIAN  30 mL  5  . loratadine (CLARITIN) 10 MG tablet Take 10 mg by mouth as needed for allergies.      . pravastatin (PRAVACHOL) 40 MG tablet TAKE ONE (1) TABLET EACH DAY  90 tablet  3   No current facility-administered medications for this visit.     Past Medical History  Diagnosis Date  . Atrioventricular block, complete   . Hypopotassemia   . Other and unspecified angina pectoris     typical  . Arthus phenomenon   . Morbid obesity   . Essential hypertension, benign 08/06/2007    Very good BP control  . HLD (hyperlipidemia)   . DM (diabetes mellitus)     adult onset   . Cardiac pacemaker in situ 07/22/2010  . DIABETES MELLITUS,  TYPE I, ADULT ONSET 08/06/2007  . History of second degree heart block   . HYPERLIPIDEMIA 08/06/2007  . Obesity   . Chronic kidney disease, stage 3     Presumably from longstanding DM & HTN. Scr has gradually increased over the past 2 years (1.6 in 12/2010, 2 in 03/2011, and up to 2.1 in 01/2013)  . Acute on chronic diastolic congestive heart failure   . Type I (juvenile type) diabetes mellitus without mention of complication, not stated as uncontrolled     (04/25/13 OV with Dr. Arrie Aran) = Poorly controlled & is working  w/Dr. Arthur Holms to improve this. Most recent Hgb A1c was 8.2% but Connor Dixon reports it was down to 7.6 last month.    ROS:   All systems reviewed and negative except as noted in the HPI.   Past Surgical History  Procedure Laterality Date  . Total hip arthroplasty  04/05/06    right  . Cataract extraction  8/08    [facility]  . Ptvdp  12/11    PPM - [employer]     Family History  Problem Relation Age of Onset  . Colon cancer Neg Hx  no definate prostate CA   . Asthma Father   . Coronary artery disease Father   . Diabetes Father   . Hyperlipidemia Father   . Coronary artery disease Mother   . Asthma Mother   . Heart attack Mother 62  . Stroke Father 68     History   Social History  . Marital Status: Married    Spouse Name: N/A    Number of Children: N/A  . Years of Education: N/A   Occupational History  . Not on file.   Social History Main Topics  . Smoking status: Never Smoker   . Smokeless tobacco: Not on file  . Alcohol Use: Yes  . Drug Use: No  . Sexual Activity: Not on file   Other Topics Concern  . Not on file   Social History Narrative   [employer] BA, 2 Master's degrees. [place] - for certification Education specialist.    Retired Animal nutritionist.   Active - travels.   Married - 1961; 2 sons (1962 and 20) and 5 grandchildren.        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      BP 138/90  Pulse 88  Ht  5\' 10"  (1.778 m)  Wt 238 lb (107.956 kg)  BMI 34.15 kg/m2  Physical Exam:  Well appearing obese 78 year old man, NAD HEENT: Unremarkable Neck:  7 cm JVD, no thyromegally Lungs:  Clear with no wheezes, rales, or rhonchi. HEART:  Regular rate rhythm, no murmurs, no rubs, no clicks Abd:  soft, positive bowel sounds, no organomegally, no rebound, no guarding Ext:  2 plus pulses, no edema, no cyanosis, no clubbing Skin:  No rashes no nodules Neuro:  CN II through XII intact, motor grossly intact   DEVICE  Normal device function.  See PaceArt for details.   Assess/Plan:

## 2013-05-02 NOTE — Patient Instructions (Signed)
Your physician wants you to follow-up in: 12  Months with Dr Court Joy will receive a reminder letter in the mail two months in advance. If you don't receive a letter, please call our office to schedule the follow-up appointment.    Remote monitoring is used to monitor your Pacemaker or ICD from home. This monitoring reduces the number of office visits required to check your device to one time per year. It allows [place] to keep an eye on the functioning of your device to ensure it is working properly. You are scheduled for a device check from home on 08/05/13 You may send your transmission at any time that day. If you have a wireless device, the transmission will be sent automatically. After your physician reviews your transmission, you will receive a postcard with your next transmission date.

## 2013-05-28 ENCOUNTER — Other Ambulatory Visit: Payer: Self-pay

## 2013-05-28 MED ORDER — CARVEDILOL 6.25 MG PO TABS: ORAL_TABLET | ORAL | Status: DC

## 2013-06-14 ENCOUNTER — Other Ambulatory Visit: Payer: Self-pay | Admitting: Internal Medicine

## 2013-06-20 ENCOUNTER — Other Ambulatory Visit: Payer: Self-pay | Admitting: Internal Medicine

## 2013-06-21 ENCOUNTER — Other Ambulatory Visit: Payer: Self-pay | Admitting: Internal Medicine

## 2013-07-01 ENCOUNTER — Ambulatory Visit (INDEPENDENT_AMBULATORY_CARE_PROVIDER_SITE_OTHER): Payer: Medicare FFS | Admitting: Internal Medicine

## 2013-07-01 ENCOUNTER — Encounter: Payer: Self-pay | Admitting: Internal Medicine

## 2013-07-01 VITALS — BP 100/70 | HR 81 | Temp 98.0°F | Wt 267.4 lb

## 2013-07-01 DIAGNOSIS — M659 Synovitis and tenosynovitis, unspecified: Secondary | ICD-10-CM

## 2013-07-01 DIAGNOSIS — M7750 Other enthesopathy of unspecified foot: Secondary | ICD-10-CM

## 2013-07-01 MED ORDER — DICLOFENAC SODIUM 1 % TD GEL: 2.0000 g | Freq: Four times a day (QID) | TRANSDERMAL | Status: DC

## 2013-07-01 MED ORDER — INSULIN GLARGINE 100 UNIT/ML ~~LOC~~ SOLN: SUBCUTANEOUS | Status: DC

## 2013-07-01 NOTE — Patient Instructions (Signed)
Tendonitis left foot involving the calcaneus tendon and at the tendons of the medial and lateral foot. No deformity  Plan Heat is good: sports patches or heating pad on low with a towel between the pad and the skin  Voltaren gel apply 4 times a day and can alternate with a rub of choice  For lack of improvement I would recommend seeing Dr. Michiel SitesZ. Smith.   Thank you for your confidence in me over the years. As I retire Dr. Sanda Lingerhomas Jones will continue to provide you excellent care.    Tendinitis Tendinitis is swelling and inflammation of the tendons. Tendons are band-like tissues that connect muscle to bone. Tendinitis commonly occurs in the:   Shoulders (rotator cuff).  Heels (Achilles tendon).  Elbows (triceps tendon). CAUSES Tendinitis is usually caused by overusing the tendon, muscles, and joints involved. When the tissue surrounding a tendon (synovium) becomes inflamed, it is called tenosynovitis. Tendinitis commonly develops in people whose jobs require repetitive motions. SYMPTOMS  Pain.  Tenderness.  Mild swelling. DIAGNOSIS Tendinitis is usually diagnosed by physical exam. Your caregiver may also order X-rays or other imaging tests. TREATMENT Your caregiver may recommend certain medicines or exercises for your treatment. HOME CARE INSTRUCTIONS   Use a sling or splint for as long as directed by your caregiver until the pain decreases.  Put ice on the injured area.  Put ice in a plastic bag.  Place a towel between your skin and the bag.  Leave the ice on for 15-20 minutes, 03-04 times a day.  Avoid using the limb while the tendon is painful. Perform gentle range of motion exercises only as directed by your caregiver. Stop exercises if pain or discomfort increase, unless directed otherwise by your caregiver.  Only take over-the-counter or prescription medicines for pain, discomfort, or fever as directed by your caregiver. SEEK MEDICAL CARE IF:   Your pain and swelling  increase.  You develop new, unexplained symptoms, especially increased numbness in the hands. MAKE SURE YOU:   Understand these instructions.  Will watch your condition.  Will get help right away if you are not doing well or get worse. Document Released: 04/29/2000 Document Revised: 07/25/2011 Document Reviewed: 07/19/2010 [facility] Patient Information 2014 [place]ExitCare, [place].

## 2013-07-01 NOTE — Progress Notes (Signed)
   Subjective:    Patient ID: Connor Dixon, male    DOB: 02/02/1935, 79 y.o.   MRN: [id number]  HPI [name] has had pain in the left foot at the calcaneus tendon and at the lateral and medial aspect. He does not recall any injury or strain. He does wear very supportive shoes.    Review of Systems     Objective:   Physical Exam Filed Vitals:   07/01/13 1102  BP: 100/70  Pulse: 81  Temp: 98 F (36.7 C)   Wt Readings from Last 3 Encounters:  07/01/13 267 lb 6.4 oz (121.292 kg)  05/02/13 238 lb (107.956 kg)  04/02/13 274 lb 6.4 oz (124.467 kg)   Cor- RRR Pul - normal respirations Ext - left foot with 1+ edema, mild warmth, tender to palpation at the lateral and medial foot and at the heel cord.       Assessment & Plan:  Tendonitis left foot involving the calcaneus tendon and at the tendons of the medial and lateral foot. No deformity  Plan Heat is good: sports patches or heating pad on low with a towel between the pad and the skin  Voltaren gel apply 4 times a day and can alternate with a rub of choice  For lack of improvement I would recommend seeing Dr. Michiel SitesZ. Dixon.

## 2013-07-01 NOTE — Progress Notes (Signed)
Pre visit review using our clinic review tool, if applicable. No additional management support is needed unless otherwise documented below in the visit note. 

## 2013-07-10 ENCOUNTER — Telehealth: Payer: Self-pay | Admitting: Internal Medicine

## 2013-07-10 NOTE — Telephone Encounter (Signed)
Scheduled appointment and followed up with the patient to let him know.

## 2013-07-10 NOTE — Telephone Encounter (Signed)
Yes could you call and have them come in at 915 slot that is open.   Please tell them though we are working them in and important to be on time and might be a focused exam. Thank you

## 2013-07-10 NOTE — Telephone Encounter (Signed)
Patient wife states that Dr. Debby BudNorins spoke of referring Connor Dixon to Dr. Katrinka BlazingSmith for left foot swelling and pain. Wife states patient is a diabetic and would like Connor Dixon to be seen this coming Monday.  There is not a new patient appt open on the schedule for Monday.   Patients wife would like to know if he could be worked in?

## 2013-07-11 ENCOUNTER — Ambulatory Visit: Payer: Medicare FFS | Admitting: Internal Medicine

## 2013-07-15 ENCOUNTER — Ambulatory Visit (INDEPENDENT_AMBULATORY_CARE_PROVIDER_SITE_OTHER): Payer: Medicare FFS | Admitting: Family Medicine

## 2013-07-15 ENCOUNTER — Encounter: Payer: Self-pay | Admitting: Family Medicine

## 2013-07-15 VITALS — BP 130/76 | HR 91 | Temp 96.8°F | Resp 20 | Wt 264.1 lb

## 2013-07-15 DIAGNOSIS — M109 Gout, unspecified: Secondary | ICD-10-CM | POA: Insufficient documentation

## 2013-07-15 MED ORDER — NAPROXEN 500 MG PO TABS: 500.0000 mg | ORAL_TABLET | Freq: Two times a day (BID) | ORAL | Status: DC

## 2013-07-15 MED ORDER — LOSARTAN POTASSIUM 50 MG PO TABS: 25.0000 mg | ORAL_TABLET | Freq: Every day | ORAL | Status: DC

## 2013-07-15 NOTE — Assessment & Plan Note (Addendum)
Patient's history, physical exam and ultrasound findings all consistent with gout. Patient does have tophi of the foot mostly of the ankle mortise showing that this is a chronic problem. The patient's other medical problems we do need to be somewhat careful with medications. Change patient's enalapril to losartan and would low dose within having good but pressure control. Patient has a history of high potassium. Discussed different diet changes. Will also patient on Uloric.  Naproxen for 5 days and will discontinue after that Patient will try these interventions come back in 2 weeks' time. He continued to have trouble we will look further into the evaluation. At that time we may want to consider getting complete metabolic panel to check patient's potassium level as well as monitor closely his hypertension.

## 2013-07-15 NOTE — Progress Notes (Signed)
Pre visit review using our clinic review tool, if applicable. No additional management support is needed unless otherwise documented below in the visit note. 

## 2013-07-15 NOTE — Progress Notes (Signed)
[facility] [address] [place], [name] Phone: [phone] Subjective:    I'm seeing this patient by the request  of:  Sanda Linger, MD   CC: right foot pain and swelling.   [name] is a 79 y.o. male coming in with complaint of left ankle swelling. Patient states that this seems to come and go but seems to be worsening over the course last several months. Patient states that he can have severe pain even at night keeps him up. Patient has tried over-the-counter medications even at the take pain medications previously because of the pain. Patient does have a to about where it was difficult to ambulate. Describes it as more of a sharp stabbing sensation. As of now the swelling has gotten somewhat better but still is concerned he may have another flare. Patient does not remember any injury. Patient rates the severity at  Times 10 out of 10 but at the moment 3/ 10. Pain seems to move around symptoms on the posterior aspect of the foot and then sometimes the medial aspect of the foot. Seems to be generalized most of the time.     Past medical history, social, surgical and family history all reviewed in electronic medical record.   Review of Systems: No headache, visual changes, nausea, vomiting, diarrhea, constipation, dizziness, abdominal pain, skin rash, fevers, chills, night sweats, weight loss, swollen lymph nodes, body aches, joint swelling, muscle aches, chest pain, shortness of breath, mood changes.   Objective Blood pressure 130/76, pulse 91, temperature 96.8 F (36 C), temperature source Oral, resp. rate 20, weight 264 lb 1.9 oz (119.804 kg), SpO2 98.00%.  General: No apparent distress alert and oriented x3 mood and affect normal, dressed appropriately. Obese.  HEENT: Pupils equal, extraocular movements intact  Respiratory: Patient's speak in full sentences and does not appear short of breath  Cardiovascular: No lower  extremity edema, non tender, no erythema  Skin: Warm dry intact with no signs of infection or rash on extremities or on axial skeleton.  Abdomen: Soft nontender  Neuro: Cranial nerves II through XII are intact, neurovascularly intact in all extremities with 2+ DTRs and 2+ pulses.  Lymph: No lymphadenopathy of posterior or anterior cervical chain or axillae bilaterally.  Gait normal with good balance and coordination.  MSK:  Non tender with full range of motion and good stability and symmetric strength and tone of shoulders, elbows, wrist, hip, knee and ankles bilaterally. Multiple osteoarthritic changes of multiple joints. Ankle: Left Patient does have swelling of the left ankle compared to the contralateral side. Moderately warm to touch. No sign of skin breakdown or sign of infection. Patient does have decreased range of motion and does have severe pain with range of motion. Strength is 5/5 in all directions. Stable lateral and medial ligaments; squeeze test and kleiger test unremarkable; Talar dome minimally tender. No pain at base of 5th MT; No tenderness over cuboid; No tenderness over N spot or navicular prominence No tenderness on posterior aspects of lateral and medial malleolus No sign of peroneal tendon subluxations or tenderness to palpation Negative tarsal tunnel tinel's Able to walk 4 steps. Contralateral unremarkable.  Limited musculoskeletal ultrasound was performed and interpreted by Antoine Primas, M Limited ultrasound of the ankle shows the patient does have pathognomonic findings were tophi of the ankle mortise as well as midfoot. Patient does have some hypoechoic changes and some increasing effusion noted.  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    Impression and Recommendations:     This case required medical decision making of moderate complexity.

## 2013-07-15 NOTE — Patient Instructions (Addendum)
Very nice to meet you Gout is likely Uloric daily  Naproxen twice daily for 5 days.  Stop enalipril and start losartan.  Come back before your trip   Gout Gout is an inflammatory arthritis caused by a buildup of uric acid crystals in the joints. Uric acid is a chemical that is normally present in the blood. When the level of uric acid in the blood is too high it can form crystals that deposit in your joints and tissues. This causes joint redness, soreness, and swelling (inflammation). Repeat attacks are common. Over time, uric acid crystals can form into masses (tophi) near a joint, destroying bone and causing disfigurement. Gout is treatable and often preventable. CAUSES  The disease begins with elevated levels of uric acid in the blood. Uric acid is produced by your body when it breaks down a naturally found substance called purines. Certain foods you eat, such as meats and fish, contain high amounts of purines. Causes of an elevated uric acid level include:  Being passed down from parent to child (heredity).  Diseases that cause increased uric acid production (such as obesity, psoriasis, and certain cancers).  Excessive alcohol use.  Diet, especially diets rich in meat and seafood.  Medicines, including certain cancer-fighting medicines (chemotherapy), water pills (diuretics), and aspirin.  Chronic kidney disease. The kidneys are no longer able to remove uric acid well.  Problems with metabolism. Conditions strongly associated with gout include:  Obesity.  High blood pressure.  High cholesterol.  Diabetes. Not everyone with elevated uric acid levels gets gout. It is not understood why some people get gout and others do not. Surgery, joint injury, and eating too much of certain foods are some of the factors that can lead to gout attacks. SYMPTOMS   An attack of gout comes on quickly. It causes intense pain with redness, swelling, and warmth in a joint.  Fever can  occur.  Often, only one joint is involved. Certain joints are more commonly involved:  Base of the big toe.  Knee.  Ankle.  Wrist.  Finger. Without treatment, an attack usually goes away in a few days to weeks. Between attacks, you usually will not have symptoms, which is different from many other forms of arthritis. DIAGNOSIS  Your caregiver will suspect gout based on your symptoms and exam. In some cases, tests may be recommended. The tests may include:  Blood tests.  Urine tests.  X-rays.  Joint fluid exam. This exam requires a needle to remove fluid from the joint (arthrocentesis). Using a microscope, gout is confirmed when uric acid crystals are seen in the joint fluid. TREATMENT  There are two phases to gout treatment: treating the sudden onset (acute) attack and preventing attacks (prophylaxis).  Treatment of an Acute Attack.  Medicines are used. These include anti-inflammatory medicines or steroid medicines.  An injection of steroid medicine into the affected joint is sometimes necessary.  The painful joint is rested. Movement can worsen the arthritis.  You may use warm or cold treatments on painful joints, depending which works best for you.  Treatment to Prevent Attacks.  If you suffer from frequent gout attacks, your caregiver may advise preventive medicine. These medicines are started after the acute attack subsides. These medicines either help your kidneys eliminate uric acid from your body or decrease your uric acid production. You may need to stay on these medicines for a very long time.  The early phase of treatment with preventive medicine can be associated with an increase in  acute gout attacks. For this reason, during the first few months of treatment, your caregiver may also advise you to take medicines usually used for acute gout treatment. Be sure you understand your caregiver's directions. Your caregiver may make several adjustments to your medicine  dose before these medicines are effective.  Discuss dietary treatment with your caregiver or dietitian. Alcohol and drinks high in sugar and fructose and foods such as meat, poultry, and seafood can increase uric acid levels. Your caregiver or dietician can advise you on drinks and foods that should be limited. HOME CARE INSTRUCTIONS   Do not take aspirin to relieve pain. This raises uric acid levels.  Only take over-the-counter or prescription medicines for pain, discomfort, or fever as directed by your caregiver.  Rest the joint as much as possible. When in bed, keep sheets and blankets off painful areas.  Keep the affected joint raised (elevated).  Apply warm or cold treatments to painful joints. Use of warm or cold treatments depends on which works best for you.  Use crutches if the painful joint is in your leg.  Drink enough fluids to keep your urine clear or pale yellow. This helps your body get rid of uric acid. Limit alcohol, sugary drinks, and fructose drinks.  Follow your dietary instructions. Pay careful attention to the amount of protein you eat. Your daily diet should emphasize fruits, vegetables, whole grains, and fat-free or low-fat milk products. Discuss the use of coffee, vitamin C, and cherries with your caregiver or dietician. These may be helpful in lowering uric acid levels.  Maintain a healthy body weight. SEEK MEDICAL CARE IF:   You develop diarrhea, vomiting, or any side effects from medicines.  You do not feel better in 24 hours, or you are getting worse. SEEK IMMEDIATE MEDICAL CARE IF:   Your joint becomes suddenly more tender, and you have chills or a fever. MAKE SURE YOU:   Understand these instructions.  Will watch your condition.  Will get help right away if you are not doing well or get worse. Document Released: 04/29/2000 Document Revised: 08/27/2012 Document Reviewed: 12/14/2011 [facility] Patient Information 2014 [place] EstatesExitCare, [place]LLC.

## 2013-07-22 ENCOUNTER — Other Ambulatory Visit (INDEPENDENT_AMBULATORY_CARE_PROVIDER_SITE_OTHER): Payer: Medicare FFS

## 2013-07-22 ENCOUNTER — Encounter: Payer: Self-pay | Admitting: Internal Medicine

## 2013-07-22 ENCOUNTER — Ambulatory Visit (INDEPENDENT_AMBULATORY_CARE_PROVIDER_SITE_OTHER): Payer: Medicare FFS | Admitting: Internal Medicine

## 2013-07-22 VITALS — BP 118/70 | HR 83 | Temp 97.0°F | Wt 261.0 lb

## 2013-07-22 DIAGNOSIS — I10 Essential (primary) hypertension: Secondary | ICD-10-CM

## 2013-07-22 DIAGNOSIS — M109 Gout, unspecified: Secondary | ICD-10-CM

## 2013-07-22 DIAGNOSIS — E1129 Type 2 diabetes mellitus with other diabetic kidney complication: Secondary | ICD-10-CM

## 2013-07-22 DIAGNOSIS — E109 Type 1 diabetes mellitus without complications: Secondary | ICD-10-CM

## 2013-07-22 DIAGNOSIS — E1121 Type 2 diabetes mellitus with diabetic nephropathy: Secondary | ICD-10-CM

## 2013-07-22 DIAGNOSIS — N058 Unspecified nephritic syndrome with other morphologic changes: Secondary | ICD-10-CM

## 2013-07-22 LAB — BASIC METABOLIC PANEL
BUN: 47 mg/dL — ABNORMAL HIGH (ref 6–23)
CALCIUM: 9.5 mg/dL (ref 8.4–10.5)
CO2: 27 meq/L (ref 19–32)
Chloride: 105 mEq/L (ref 96–112)
Creatinine, Ser: 2 mg/dL — ABNORMAL HIGH (ref 0.4–1.5)
GFR: 41.21 mL/min — ABNORMAL LOW (ref >60.00)
GLUCOSE: 126 mg/dL — AB (ref 70–99)
Potassium: 5 mEq/L (ref 3.5–5.1)
Sodium: 141 mEq/L (ref 135–145)

## 2013-07-22 LAB — HEMOGLOBIN A1C: Hgb A1c MFr Bld: 7.8 % — ABNORMAL HIGH (ref 4.6–6.5)

## 2013-07-22 LAB — URIC ACID: Uric Acid, Serum: 4.5 mg/dL (ref 4.0–7.8)

## 2013-07-22 MED ORDER — INSULIN NPH ISOPHANE & REGULAR (70-30) 100 UNIT/ML ~~LOC~~ SUSP: SUBCUTANEOUS | Status: DC

## 2013-07-22 MED ORDER — COLCHICINE 0.6 MG PO TABS: 0.6000 mg | ORAL_TABLET | Freq: Every day | ORAL | Status: DC

## 2013-07-22 MED ORDER — FEBUXOSTAT 40 MG PO TABS: 40.0000 mg | ORAL_TABLET | Freq: Every day | ORAL | Status: DC

## 2013-07-22 NOTE — Patient Instructions (Signed)
Thanks for coming in to see me today and over the years. Your trust and confidence has meant a lot to me.  Blood pressure - great control on your current regimen including the losartan., Plan Lab: will check kidney function and potassium  Continue your present medications  Gout -  Per Dr. Katrinka BlazingSmith. Good response to non-steroidal anti-inflammatory medication.  Plan Check uric acid level - which may be down already due to the Uloric  Continue uloric unless the cost is too high or it is not covered without a PA  For any future flare of "gout" pain try taking colchicine: 0.6 mg tabs, 2 tabs at onset of pain and a 3 rd tab 2 hours later if needed. If you do not respond to         Colchicine we may need to reconsider the diagnosis.  Diabetes - due for A1C with results to MyChart. Last was 7.7% down from 8.2%  Weight management - down 13 lbs - GOOD WORK. Keep up the no sugar and decreased calorie program.

## 2013-07-22 NOTE — Progress Notes (Signed)
Pre visit review using our clinic review tool, if applicable. No additional management support is needed unless otherwise documented below in the visit note. 

## 2013-07-23 NOTE — Assessment & Plan Note (Signed)
No hypoglycemia. No problems with regimen Lab Results  Component Value Date   HGBA1C 7.8* 07/22/2013   Plan Slight improvement. Below threshold for making major change in regimen. Continue present dosing.  Continue dietary adherence.

## 2013-07-23 NOTE — Progress Notes (Signed)
Subjective:    Patient ID: Connor Dixon, male    DOB: 1934/05/25, 79 y.o.   MRN: [id number]  HPI Mr. Pottenger presents for follow up of diabetes and obesity. He has been following a no soda, reduced sugar diet with positive results. He has been adherent to his diabetes regimen with no reports of hypoglycemia. He has been well w/o major illness or problems.  Past Medical History  Diagnosis Date  . Atrioventricular block, complete   . Hypopotassemia   . Other and unspecified angina pectoris     typical  . Arthus phenomenon   . Morbid obesity   . Essential hypertension, benign 08/06/2007    Very good BP control  . HLD (hyperlipidemia)   . DM (diabetes mellitus)     adult onset   . Cardiac pacemaker in situ 07/22/2010  . DIABETES MELLITUS, TYPE I, ADULT ONSET 08/06/2007  . History of second degree heart block   . HYPERLIPIDEMIA 08/06/2007  . Obesity   . Chronic kidney disease, stage 3     Presumably from longstanding DM & HTN. Scr has gradually increased over the past 2 years (1.6 in 12/2010, 2 in 03/2011, and up to 2.1 in 01/2013)  . Acute on chronic diastolic congestive heart failure   . Type I (juvenile type) diabetes mellitus without mention of complication, not stated as uncontrolled     (04/25/13 OV with Dr. Arrie Aran) = Poorly controlled & is working  w/Dr. Arthur Holms to improve this. Most recent Hgb A1c was 8.2% but Mr. Goodlin reports it was down to 7.6 last month.   Past Surgical History  Procedure Laterality Date  . Total hip arthroplasty  04/05/06    right  . Cataract extraction  8/08    [facility]  . Ptvdp  12/11    PPM - [facility] [facility]   Family History  Problem Relation Age of Onset  . Colon cancer Neg Hx     no definate prostate CA   . Asthma Father   . Coronary artery disease Father   . Diabetes Father   . Hyperlipidemia Father   . Coronary artery disease Mother   . Asthma Mother   . Heart attack Mother 64  . Stroke Father 94   History   Social History  .  Marital Status: Married    Spouse Name: N/A    Number of Children: N/A  . Years of Education: N/A   Occupational History  . Not on file.   Social History Main Topics  . Smoking status: Never Smoker   . Smokeless tobacco: Not on file  . Alcohol Use: Yes  . Drug Use: No  . Sexual Activity: Not on file   Other Topics Concern  . Not on file   Social History Narrative   [employer] BA, 2 Master's degrees. [place] - for certification Education specialist.    Retired Animal nutritionist.   Active - travels.   Married - 1961; 2 sons (1962 and 48) and 5 grandchildren.        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     Current Outpatient Prescriptions on File Prior to Visit  Medication Sig Dispense Refill  . aspirin EC 81 MG tablet Take 81 mg by mouth daily.      . B-D INS SYRINGE 0.5CC/31GX5/16 31G X 5/16" 0.5 ML MISC As directed      . BD ULTRA-FINE PEN NEEDLES 29G X 12.7MM MISC As directed      .  carvedilol (COREG) 6.25 MG tablet TAKE ONE TABLET TWICE DAILY  60 tablet  6  . diclofenac sodium (VOLTAREN) 1 % GEL Apply 2 g topically 4 (four) times daily.  3 Tube  5  . FREESTYLE LITE test strip As directed      . furosemide (LASIX) 40 MG tablet Take 1 tablet in the morning and 1/2 tablet at 1:00 pm.  135 tablet  3  . insulin glargine (LANTUS) 100 UNIT/ML injection INJECT 20 UNITS UNDER THE SKIN EACH NIGHT AS DIRECTED BY YOUR PHYSICIAN  10 mL  3  . LANTUS SOLOSTAR 100 UNIT/ML SOPN INJECT 20 UNITS UNDER THE SKIN AT NIGHT AS DIRECTED BY YOUR PHYSICIAN  30 mL  5  . loratadine (CLARITIN) 10 MG tablet Take 10 mg by mouth as needed for allergies.      Marland Kitchen. losartan (COZAAR) 50 MG tablet Take 0.5 tablets (25 mg total) by mouth daily.  45 tablet  3  . naproxen (NAPROSYN) 500 MG tablet Take 1 tablet (500 mg total) by mouth 2 (two) times daily with a meal.  10 tablet  0  . pravastatin (PRAVACHOL) 40 MG tablet TAKE ONE (1) TABLET EACH DAY  90 tablet  3   No current facility-administered  medications on file prior to visit.      Review of Systems System review is negative for any constitutional, cardiac, pulmonary, GI or neuro symptoms or complaints other than as described in the HPI.     Objective:   Physical Exam Filed Vitals:   07/22/13 0825  BP: 118/70  Pulse: 83  Temp: 97 F (36.1 C)   Wt Readings from Last 3 Encounters:  07/22/13 261 lb (118.389 kg)  07/15/13 264 lb 1.9 oz (119.804 kg)  07/01/13 267 lb 6.4 oz (121.292 kg)   BP Readings from Last 3 Encounters:  07/22/13 118/70  07/15/13 130/76  07/01/13 100/70   Gen'l- obese man in no acute distress HEENT_ C&S clear Cor - 2+ radial pulse Pulm - normal respirations Neuro - A&O x 3, normal gait.       Assessment & Plan:

## 2013-07-23 NOTE — Assessment & Plan Note (Signed)
Diagnosis by Dr. Michiel SitesZ. Smith by exam and U/S. Started on Uloric (prior to baseline uric acid level) and NSAIDs. Discussed mechanism of disease with patient.  Plan Uric acid level-although already on treatment  Instructed in the use of colchicine for any recurrent flare  Addendum: uric acid 4.5 = normal range. Plan Can continue uloric. If too price he can use allopurinol even with renal insufficiency.

## 2013-07-23 NOTE — Assessment & Plan Note (Addendum)
No c/o medication problems. Changed from ACE-I to ARB by Dr. Katrinka BlazingSmith - less gout potential. BP Readings from Last 3 Encounters:  07/22/13 118/70  07/15/13 130/76  07/01/13 100/70  good control on changed medication  Plan Continue present regimen   [facility]

## 2013-07-23 NOTE — Assessment & Plan Note (Signed)
Continued progression of renal insufficiency.   Plan Recheck in 3 months

## 2013-07-23 NOTE — Assessment & Plan Note (Signed)
Body mass index is 37.45 kg/(m^2). He is down 14 lbs since last December - slow steady progress  Plan Continue diet management.

## 2013-07-25 ENCOUNTER — Ambulatory Visit (INDEPENDENT_AMBULATORY_CARE_PROVIDER_SITE_OTHER): Payer: Medicare FFS | Admitting: Family Medicine

## 2013-07-25 ENCOUNTER — Encounter: Payer: Self-pay | Admitting: Family Medicine

## 2013-07-25 VITALS — BP 136/72 | HR 80 | Temp 97.6°F | Resp 18 | Wt 261.4 lb

## 2013-07-25 DIAGNOSIS — M109 Gout, unspecified: Secondary | ICD-10-CM

## 2013-07-25 MED ORDER — ALLOPURINOL 100 MG PO TABS: 100.0000 mg | ORAL_TABLET | Freq: Every day | ORAL | Status: DC

## 2013-07-25 NOTE — Progress Notes (Signed)
  Tawana ScaleZach Tramayne Sebesta D.O. [facility] [address] [place], [place] [address] Phone: [phone] Subjective:     CC: right foot pain and swelling follow up  OZH:[id number]:Subjective Connor DickerHenry C Dixon is a 79 y.o. male coming in  Follow up for left foot and ankle swelling. Patient was diagnosed with gout flare at last visit. Patient states he is 95% better. Patient states that he is having no pain but still has some mild swelling but seems to get worse throughout the day and completely resolved by morning the next day. Patient has not been wearing his compression stockings that he use to. Patient though has been watching his diet and has been taking the trials of the lower without any difficulties. Patient though does at this could be expensive medication and would like something cheaper. No nighttime awakening    Past medical history, social, surgical and family history all reviewed in electronic medical record.   Review of Systems: No headache, visual changes, nausea, vomiting, diarrhea, constipation, dizziness, abdominal pain, skin rash, fevers, chills, night sweats, weight loss, swollen lymph nodes, body aches, joint swelling, muscle aches, chest pain, shortness of breath, mood changes.   Objective Blood pressure 136/72, pulse 80, temperature 97.6 F (36.4 C), temperature source Oral, resp. rate 18, weight 261 lb 6.4 oz (118.57 kg), SpO2 97.00%.  General: No apparent distress alert and oriented x3 mood and affect normal, dressed appropriately. Obese.  HEENT: Pupils equal, extraocular movements intact  Respiratory: Patient's speak in full sentences and does not appear short of breath  Cardiovascular: No lower extremity edema, non tender, no erythema  Skin: Warm dry intact with no signs of infection or rash on extremities or on axial skeleton.  Abdomen: Soft nontender  Neuro: Cranial nerves II through XII are intact, neurovascularly intact in all extremities with 2+ DTRs and 2+ pulses.  Lymph: No  lymphadenopathy of posterior or anterior cervical chain or axillae bilaterally.  Gait normal with good balance and coordination.  MSK:  Non tender with full range of motion and good stability and symmetric strength and tone of shoulders, elbows, wrist, hip, knee and ankles bilaterally. Multiple osteoarthritic changes of multiple joints. Ankle: Left Patient does trace swelling.  No longer warm to touch  Patient does have near full range of motion Strength is 5/5 in all directions. Stable lateral and medial ligaments; squeeze test and kleiger test unremarkable; Talar dome nontender No pain at base of 5th MT; No tenderness over cuboid; No tenderness over N spot or navicular prominence No tenderness on posterior aspects of lateral and medial malleolus No sign of peroneal tendon subluxations or tenderness to palpation Negative tarsal tunnel tinel's Able to walk 4 steps. Contralateral unremarkable.      Impression and Recommendations:     This case required medical decision making of moderate complexity.

## 2013-07-25 NOTE — Progress Notes (Signed)
Pre visit review using our clinic review tool, if applicable. No additional management support is needed unless otherwise documented below in the visit note. 

## 2013-07-25 NOTE — Assessment & Plan Note (Signed)
Patient is doing significantly better at this time. Discuss continue to wear compression stockings because I think the swelling is likely secondary to patient's cardiac history more than gout. Encourage him to continue to watch his diet. We will start allopurinol at a very low dose and try titrate up over the course of time. We will need to be cautious secondary to patient's nephropathy. If this does not seem to control it I would go back to uloric.  Patient will follow up on an as-needed basis.

## 2013-08-05 ENCOUNTER — Ambulatory Visit (INDEPENDENT_AMBULATORY_CARE_PROVIDER_SITE_OTHER): Payer: Medicare FFS | Admitting: *Deleted

## 2013-08-05 ENCOUNTER — Other Ambulatory Visit: Payer: Self-pay | Admitting: Internal Medicine

## 2013-08-05 DIAGNOSIS — I442 Atrioventricular block, complete: Secondary | ICD-10-CM

## 2013-08-05 LAB — MDC_IDC_ENUM_SESS_TYPE_REMOTE
Battery Remaining Longevity: 71 mo
Brady Statistic AP VS Percent: 1 %
Brady Statistic AS VS Percent: 1 %
Brady Statistic RA Percent Paced: 1 %
Lead Channel Impedance Value: 380 Ohm
Lead Channel Impedance Value: 430 Ohm
Lead Channel Pacing Threshold Amplitude: 1 V
Lead Channel Pacing Threshold Amplitude: 1.875 V
Lead Channel Sensing Intrinsic Amplitude: 9.2 mV
Lead Channel Setting Pacing Amplitude: 2.5 V
Lead Channel Setting Pacing Amplitude: 3.375
Lead Channel Setting Pacing Pulse Width: 0.4 ms
Lead Channel Setting Sensing Sensitivity: 3 mV
MDC IDC MSMT BATTERY VOLTAGE: 2.93 V
MDC IDC MSMT LEADCHNL RA PACING THRESHOLD PULSEWIDTH: 0.4 ms
MDC IDC MSMT LEADCHNL RA SENSING INTR AMPL: 1.3 mV
MDC IDC MSMT LEADCHNL RV PACING THRESHOLD PULSEWIDTH: 0.4 ms
MDC IDC PG SERIAL: 7192434
MDC IDC SESS DTM: 20150323074143
MDC IDC STAT BRADY AP VP PERCENT: 1.2 %
MDC IDC STAT BRADY AS VP PERCENT: 98 %
MDC IDC STAT BRADY RV PERCENT PACED: 99 %

## 2013-08-13 ENCOUNTER — Encounter: Payer: Self-pay | Admitting: *Deleted

## 2013-08-19 ENCOUNTER — Encounter: Payer: Self-pay | Admitting: Internal Medicine

## 2013-09-09 ENCOUNTER — Telehealth: Payer: Self-pay

## 2013-09-09 MED ORDER — ACCU-CHEK SOFT TOUCH LANCETS MISC: Status: DC

## 2013-09-09 MED ORDER — ACCU-CHEK AVIVA DEVI: Status: DC

## 2013-09-09 MED ORDER — GLUCOSE BLOOD VI STRP: ORAL_STRIP | Status: DC

## 2013-09-09 NOTE — Telephone Encounter (Signed)
Received pharmacy rejection stating that insurance will not cover freestyle lite test strip without a prior authorization. Preferred product for [facility] are Accu-Chek, Prodigy, True Test, True Track and True Metrix.

## 2013-09-20 ENCOUNTER — Other Ambulatory Visit: Payer: Self-pay

## 2013-09-20 MED ORDER — GLUCOSE BLOOD VI STRP: ORAL_STRIP | Status: DC

## 2013-09-20 MED ORDER — FREESTYLE LANCETS MISC: Status: DC

## 2013-09-20 NOTE — Telephone Encounter (Signed)
[facility] strips, lancets with dx.

## 2013-09-23 ENCOUNTER — Telehealth: Payer: Self-pay | Admitting: Internal Medicine

## 2013-09-23 NOTE — Telephone Encounter (Signed)
Pt needed freestyle lite called in (it was a rx for new pharm).  Pharm has been called.

## 2013-10-21 ENCOUNTER — Other Ambulatory Visit: Payer: Self-pay

## 2013-10-21 MED ORDER — "INSULIN SYRINGE-NEEDLE U-100 31G X 5/16"" 0.5 ML MISC": Status: DC

## 2013-10-22 ENCOUNTER — Encounter: Payer: Self-pay | Admitting: Internal Medicine

## 2013-10-22 ENCOUNTER — Ambulatory Visit (INDEPENDENT_AMBULATORY_CARE_PROVIDER_SITE_OTHER): Payer: Medicare FFS | Admitting: Internal Medicine

## 2013-10-22 ENCOUNTER — Other Ambulatory Visit (INDEPENDENT_AMBULATORY_CARE_PROVIDER_SITE_OTHER): Payer: Medicare FFS

## 2013-10-22 VITALS — BP 130/80 | HR 81 | Temp 97.9°F | Resp 16 | Ht 70.0 in | Wt 262.8 lb

## 2013-10-22 DIAGNOSIS — Z950 Presence of cardiac pacemaker: Secondary | ICD-10-CM

## 2013-10-22 DIAGNOSIS — I10 Essential (primary) hypertension: Secondary | ICD-10-CM

## 2013-10-22 DIAGNOSIS — E1129 Type 2 diabetes mellitus with other diabetic kidney complication: Secondary | ICD-10-CM

## 2013-10-22 DIAGNOSIS — N183 Chronic kidney disease, stage 3 unspecified: Secondary | ICD-10-CM | POA: Insufficient documentation

## 2013-10-22 DIAGNOSIS — E785 Hyperlipidemia, unspecified: Secondary | ICD-10-CM

## 2013-10-22 DIAGNOSIS — E1165 Type 2 diabetes mellitus with hyperglycemia: Principal | ICD-10-CM

## 2013-10-22 LAB — COMPREHENSIVE METABOLIC PANEL
ALK PHOS: 92 U/L (ref 39–117)
ALT: 20 U/L (ref 0–53)
AST: 26 U/L (ref 0–37)
Albumin: 3.8 g/dL (ref 3.5–5.2)
BUN: 30 mg/dL — ABNORMAL HIGH (ref 6–23)
CO2: 29 mEq/L (ref 19–32)
Calcium: 9.4 mg/dL (ref 8.4–10.5)
Chloride: 105 mEq/L (ref 96–112)
Creatinine, Ser: 1.7 mg/dL — ABNORMAL HIGH (ref 0.4–1.5)
GFR: 51.95 mL/min — ABNORMAL LOW (ref >60.00)
GLUCOSE: 65 mg/dL — AB (ref 70–99)
Potassium: 4.3 mEq/L (ref 3.5–5.1)
SODIUM: 139 meq/L (ref 135–145)
TOTAL PROTEIN: 7 g/dL (ref 6.0–8.3)
Total Bilirubin: 0.9 mg/dL (ref 0.2–1.2)

## 2013-10-22 LAB — CBC WITH DIFFERENTIAL/PLATELET
BASOS ABS: 0 10*3/uL (ref 0.0–0.1)
Basophils Relative: 0.3 % (ref 0.0–3.0)
EOS ABS: 0.2 10*3/uL (ref 0.0–0.7)
Eosinophils Relative: 4.2 % (ref 0.0–5.0)
HEMATOCRIT: 45.6 % (ref 39.0–52.0)
Hemoglobin: 15.1 g/dL (ref 13.0–17.0)
LYMPHS ABS: 1.3 10*3/uL (ref 0.7–4.0)
Lymphocytes Relative: 21.8 % (ref 12.0–46.0)
MCHC: 33 g/dL (ref 30.0–36.0)
MCV: 91.8 fl (ref 78.0–100.0)
MONO ABS: 0.7 10*3/uL (ref 0.1–1.0)
Monocytes Relative: 11.7 % (ref 3.0–12.0)
NEUTROS PCT: 62 % (ref 43.0–77.0)
Neutro Abs: 3.6 10*3/uL (ref 1.4–7.7)
PLATELETS: 211 10*3/uL (ref 150.0–400.0)
RBC: 4.97 Mil/uL (ref 4.22–5.81)
RDW: 15.1 % (ref 11.5–15.5)
WBC: 5.8 10*3/uL (ref 4.0–10.5)

## 2013-10-22 LAB — LIPID PANEL
Cholesterol: 136 mg/dL (ref 0–200)
HDL: 46.4 mg/dL (ref >39.00)
LDL CALC: 74 mg/dL (ref 0–99)
NONHDL: 89.6
Total CHOL/HDL Ratio: 3
Triglycerides: 77 mg/dL (ref 0.0–149.0)
VLDL: 15.4 mg/dL (ref 0.0–40.0)

## 2013-10-22 LAB — HEMOGLOBIN A1C: HEMOGLOBIN A1C: 7.6 % — AB (ref 4.6–6.5)

## 2013-10-22 LAB — TSH: TSH: 1.32 u[IU]/mL (ref 0.35–4.50)

## 2013-10-22 MED ORDER — INSULIN PEN NEEDLE 29G X 12.7MM MISC: Status: DC

## 2013-10-22 NOTE — Patient Instructions (Signed)
Type 2 Diabetes Mellitus, Adult Type 2 diabetes mellitus, often simply referred to as type 2 diabetes, is a long-lasting (chronic) disease. In type 2 diabetes, the pancreas does not make enough insulin (a hormone), the cells are less responsive to the insulin that is made (insulin resistance), or both. Normally, insulin moves sugars from food into the tissue cells. The tissue cells use the sugars for energy. The lack of insulin or the lack of normal response to insulin causes excess sugars to build up in the blood instead of going into the tissue cells. As a result, high blood sugar (hyperglycemia) develops. The effect of high sugar (glucose) levels can cause many complications. Type 2 diabetes was also previously called adult-onset diabetes but it can occur at any age.  RISK FACTORS  A person is predisposed to developing type 2 diabetes if someone in the family has the disease and also has one or more of the following primary risk factors:  Overweight.  An inactive lifestyle.  A history of consistently eating high-calorie foods. Maintaining a normal weight and regular physical activity can reduce the chance of developing type 2 diabetes. SYMPTOMS  A person with type 2 diabetes may not show symptoms initially. The symptoms of type 2 diabetes appear slowly. The symptoms include:  Increased thirst (polydipsia).  Increased urination (polyuria).  Increased urination during the night (nocturia).  Weight loss. This weight loss may be rapid.  Frequent, recurring infections.  Tiredness (fatigue).  Weakness.  Vision changes, such as blurred vision.  Fruity smell to your breath.  Abdominal pain.  Nausea or vomiting.  Cuts or bruises which are slow to heal.  Tingling or numbness in the hands or feet. DIAGNOSIS Type 2 diabetes is frequently not diagnosed until complications of diabetes are present. Type 2 diabetes is diagnosed when symptoms or complications are present and when blood  glucose levels are increased. Your blood glucose level may be checked by one or more of the following blood tests:  A fasting blood glucose test. You will not be allowed to eat for at least 8 hours before a blood sample is taken.  A random blood glucose test. Your blood glucose is checked at any time of the day regardless of when you ate.  A hemoglobin A1c blood glucose test. A hemoglobin A1c test provides information about blood glucose control over the previous 3 months.  An oral glucose tolerance test (OGTT). Your blood glucose is measured after you have not eaten (fasted) for 2 hours and then after you drink a glucose-containing beverage. TREATMENT   You may need to take insulin or diabetes medicine daily to keep blood glucose levels in the desired range.  You will need to match insulin dosing with exercise and healthy food choices. The treatment goal is to maintain the before meal blood sugar (preprandial glucose) level at 70 130 mg/dL. HOME CARE INSTRUCTIONS   Have your hemoglobin A1c level checked twice a year.  Perform daily blood glucose monitoring as directed by your caregiver.  Monitor urine ketones when you are ill and as directed by your caregiver.  Take your diabetes medicine or insulin as directed by your caregiver to maintain your blood glucose levels in the desired range.  Never run out of diabetes medicine or insulin. It is needed every day.  Adjust insulin based on your intake of carbohydrates. Carbohydrates can raise blood glucose levels but need to be included in your diet. Carbohydrates provide vitamins, minerals, and fiber which are an essential part of   a healthy diet. Carbohydrates are found in fruits, vegetables, whole grains, dairy products, legumes, and foods containing added sugars.    Eat healthy foods. Alternate 3 meals with 3 snacks.  Lose weight if overweight.  Carry a medical alert card or wear your medical alert jewelry.  Carry a 15 gram  carbohydrate snack with you at all times to treat low blood glucose (hypoglycemia). Some examples of 15 gram carbohydrate snacks include:  Glucose tablets, 3 or 4   Glucose gel, 15 gram tube  Raisins, 2 tablespoons (24 grams)  Jelly beans, 6  Animal crackers, 8  Regular pop, 4 ounces (120 mL)  Gummy treats, 9  Recognize hypoglycemia. Hypoglycemia occurs with blood glucose levels of 70 mg/dL and below. The risk for hypoglycemia increases when fasting or skipping meals, during or after intense exercise, and during sleep. Hypoglycemia symptoms can include:  Tremors or shakes.  Decreased ability to concentrate.  Sweating.  Increased heart rate.  Headache.  Dry mouth.  Hunger.  Irritability.  Anxiety.  Restless sleep.  Altered speech or coordination.  Confusion.  Treat hypoglycemia promptly. If you are alert and able to safely swallow, follow the 15:15 rule:  Take 15 20 grams of rapid-acting glucose or carbohydrate. Rapid-acting options include glucose gel, glucose tablets, or 4 ounces (120 mL) of fruit juice, regular soda, or low fat milk.  Check your blood glucose level 15 minutes after taking the glucose.  Take 15 20 grams more of glucose if the repeat blood glucose level is still 70 mg/dL or below.  Eat a meal or snack within 1 hour once blood glucose levels return to normal.    Be alert to polyuria and polydipsia which are early signs of hyperglycemia. An early awareness of hyperglycemia allows for prompt treatment. Treat hyperglycemia as directed by your caregiver.  Engage in at least 150 minutes of moderate-intensity physical activity a week, spread over at least 3 days of the week or as directed by your caregiver. In addition, you should engage in resistance exercise at least 2 times a week or as directed by your caregiver.  Adjust your medicine and food intake as needed if you start a new exercise or sport.  Follow your sick day plan at any time you  are unable to eat or drink as usual.  Avoid tobacco use.  Limit alcohol intake to no more than 1 drink per day for nonpregnant women and 2 drinks per day for men. You should drink alcohol only when you are also eating food. Talk with your caregiver whether alcohol is safe for you. Tell your caregiver if you drink alcohol several times a week.  Follow up with your caregiver regularly.  Schedule an eye exam soon after the diagnosis of type 2 diabetes and then annually.  Perform daily skin and foot care. Examine your skin and feet daily for cuts, bruises, redness, nail problems, bleeding, blisters, or sores. A foot exam by a caregiver should be done annually.  Brush your teeth and gums at least twice a day and floss at least once a day. Follow up with your dentist regularly.  Share your diabetes management plan with your workplace or school.  Stay up-to-date with immunizations.  Learn to manage stress.  Obtain ongoing diabetes education and support as needed.  Participate in, or seek rehabilitation as needed to maintain or improve independence and quality of life. Request a physical or occupational therapy referral if you are having foot or hand numbness or difficulties with grooming,   dressing, eating, or physical activity. SEEK MEDICAL CARE IF:   You are unable to eat food or drink fluids for more than 6 hours.  You have nausea and vomiting for more than 6 hours.  Your blood glucose level is over 240 mg/dL.  There is a change in mental status.  You develop an additional serious illness.  You have diarrhea for more than 6 hours.  You have been sick or have had a fever for a couple of days and are not getting better.  You have pain during any physical activity.  SEEK IMMEDIATE MEDICAL CARE IF:  You have difficulty breathing.  You have moderate to large ketone levels. MAKE SURE YOU:  Understand these instructions.  Will watch your condition.  Will get help right away if  you are not doing well or get worse. Document Released: 05/02/2005 Document Revised: 01/25/2012 Document Reviewed: 11/29/2011 Exit[employer] Patient Information 2014 [employer].  

## 2013-10-22 NOTE — Progress Notes (Signed)
Pre visit review using our clinic review tool, if applicable. No additional management support is needed unless otherwise documented below in the visit note. 

## 2013-10-22 NOTE — Progress Notes (Signed)
Subjective:    Patient ID: Connor Dixon, male    DOB: [birth year], 79 y.o.   MRN: [id number]  Diabetes He presents for his follow-up diabetic visit. He has type 2 diabetes mellitus. His disease course has been fluctuating. There are no hypoglycemic associated symptoms. Pertinent negatives for hypoglycemia include no dizziness or speech difficulty. Pertinent negatives for diabetes include no blurred vision, no chest pain, no fatigue, no foot paresthesias, no foot ulcerations, no polydipsia, no polyphagia, no polyuria, no visual change, no weakness and no weight loss. There are no hypoglycemic complications. Diabetic complications include heart disease and nephropathy. Current diabetic treatment includes intensive insulin program and insulin injections. He is compliant with treatment all of the time. His weight is increasing steadily. He is following a generally unhealthy diet. When asked about meal planning, he reported none. He never participates in exercise. There is no change in his home blood glucose trend. An ACE inhibitor/angiotensin II receptor blocker is being taken. He does not see a podiatrist.Eye exam is not current.      Review of Systems  Constitutional: Negative.  Negative for fever, chills, weight loss, diaphoresis, appetite change and fatigue.  HENT: Negative.   Eyes: Negative.  Negative for blurred vision.  Respiratory: Negative.  Negative for cough, choking, chest tightness, shortness of breath and stridor.   Cardiovascular: Negative.  Negative for chest pain, palpitations and leg swelling.  Gastrointestinal: Negative.  Negative for nausea, vomiting, abdominal pain, diarrhea, constipation and blood in stool.  Endocrine: Negative.  Negative for polydipsia, polyphagia and polyuria.  Genitourinary: Negative.   Musculoskeletal: Negative.  Negative for arthralgias, back pain, myalgias and neck pain.  Skin: Negative.  Negative for rash.  Allergic/Immunologic: Negative.     Neurological: Negative.  Negative for dizziness, facial asymmetry, speech difficulty, weakness, light-headedness and numbness.  Hematological: Negative.  Negative for adenopathy. Does not bruise/bleed easily.  Psychiatric/Behavioral: Negative.        Objective:   Physical Exam  Vitals reviewed. Constitutional: He is oriented to person, place, and time. He appears well-developed and well-nourished. No distress.  HENT:  Head: Normocephalic and atraumatic.  Mouth/Throat: Oropharynx is clear and moist. No oropharyngeal exudate.  Eyes: Conjunctivae are normal. Right eye exhibits no discharge. Left eye exhibits no discharge. No scleral icterus.  Neck: Normal range of motion. Neck supple. No JVD present. No tracheal deviation present. No thyromegaly present.  Cardiovascular: Normal rate, regular rhythm, S1 normal, S2 normal and intact distal pulses.  Exam reveals no gallop and no friction rub.   Murmur heard.  Decrescendo systolic murmur is present with a grade of 1/6   No diastolic murmur is present  Pulses:      Carotid pulses are 1+ on the right side, and 1+ on the left side.      Radial pulses are 1+ on the right side, and 1+ on the left side.       Femoral pulses are 1+ on the right side, and 1+ on the left side.      Popliteal pulses are 1+ on the right side, and 1+ on the left side.       Dorsalis pedis pulses are 1+ on the right side, and 1+ on the left side.       Posterior tibial pulses are 1+ on the right side, and 1+ on the left side.  Pulmonary/Chest: Effort normal and breath sounds normal. No stridor. No respiratory distress. He has no wheezes. He has no rales. He exhibits no  tenderness.  Abdominal: Soft. Bowel sounds are normal. He exhibits no distension and no mass. There is no tenderness. There is no rebound and no guarding.  Musculoskeletal: Normal range of motion. He exhibits no edema and no tenderness.  Lymphadenopathy:    He has no cervical adenopathy.  Neurological: He  is oriented to person, place, and time.  Skin: Skin is warm and dry. No rash noted. He is not diaphoretic. No erythema. No pallor.  Psychiatric: He has a normal mood and affect. His behavior is normal. Judgment and thought content normal.     Lab Results  Component Value Date   WBC 6.9 03/24/2011   HGB 14.8 01/15/2013   HCT 43.6 01/15/2013   PLT 189.0 03/24/2011   GLUCOSE 126* 07/22/2013   CHOL 163 01/15/2013   TRIG 73.0 01/15/2013   HDL 47.30 01/15/2013   LDLCALC 101* 01/15/2013   ALT 20 04/02/2013   AST 24 04/02/2013   NA 141 07/22/2013   K 5.0 07/22/2013   CL 105 07/22/2013   CREATININE 2.0* 07/22/2013   BUN 47* 07/22/2013   CO2 27 07/22/2013   TSH 0.84 03/24/2011   PSA 1.77 01/06/2010   HGBA1C 7.8* 07/22/2013   MICROALBUR 44.9* 08/06/2007       Assessment & Plan:

## 2013-10-23 ENCOUNTER — Encounter: Payer: Self-pay | Admitting: Internal Medicine

## 2013-10-23 NOTE — Assessment & Plan Note (Signed)
His renal function has improved some He will cont taking the ARB and will avoid nephrotoxic meds

## 2013-10-23 NOTE — Assessment & Plan Note (Signed)
He has achieved his LDL goal 

## 2013-10-23 NOTE — Assessment & Plan Note (Signed)
His BP is well controlled 

## 2013-10-23 NOTE — Assessment & Plan Note (Signed)
His blood sugars are improving He agrees to be more diligent with his insulin therapy and lifestyle modifications

## 2013-11-07 ENCOUNTER — Ambulatory Visit (INDEPENDENT_AMBULATORY_CARE_PROVIDER_SITE_OTHER): Payer: Medicare FFS | Admitting: *Deleted

## 2013-11-07 DIAGNOSIS — Z950 Presence of cardiac pacemaker: Secondary | ICD-10-CM

## 2013-11-07 DIAGNOSIS — I442 Atrioventricular block, complete: Secondary | ICD-10-CM

## 2013-11-07 NOTE — Progress Notes (Signed)
Remote pacemaker transmission.   

## 2013-11-11 LAB — MDC_IDC_ENUM_SESS_TYPE_REMOTE
Battery Remaining Percentage: 74 %
Brady Statistic AP VP Percent: 1 %
Brady Statistic AS VP Percent: 98 %
Brady Statistic AS VS Percent: 1 %
Brady Statistic RV Percent Paced: 99 %
Date Time Interrogation Session: 20150625061414
Implantable Pulse Generator Model: 2210
Implantable Pulse Generator Serial Number: 7192434
Lead Channel Impedance Value: 400 Ohm
Lead Channel Pacing Threshold Amplitude: 0.625 V
Lead Channel Pacing Threshold Pulse Width: 0.4 ms
Lead Channel Sensing Intrinsic Amplitude: 1.5 mV
Lead Channel Sensing Intrinsic Amplitude: 9.2 mV
Lead Channel Setting Pacing Amplitude: 1.625
Lead Channel Setting Sensing Sensitivity: 3 mV
MDC IDC MSMT BATTERY REMAINING LONGEVITY: 83 mo
MDC IDC MSMT BATTERY VOLTAGE: 2.95 V
MDC IDC MSMT LEADCHNL RA IMPEDANCE VALUE: 450 Ohm
MDC IDC SET LEADCHNL RV PACING AMPLITUDE: 2.5 V
MDC IDC SET LEADCHNL RV PACING PULSEWIDTH: 0.4 ms
MDC IDC STAT BRADY AP VS PERCENT: 1 %
MDC IDC STAT BRADY RA PERCENT PACED: 1 %

## 2013-11-20 ENCOUNTER — Encounter: Payer: Self-pay | Admitting: Cardiology

## 2013-11-22 ENCOUNTER — Encounter: Payer: Self-pay | Admitting: Cardiology

## 2013-11-26 ENCOUNTER — Other Ambulatory Visit: Payer: Self-pay

## 2013-11-26 ENCOUNTER — Encounter: Payer: Self-pay | Admitting: Internal Medicine

## 2013-11-26 MED ORDER — "INSULIN SYRINGE-NEEDLE U-100 31G X 5/16"" 0.5 ML MISC": Status: DC

## 2013-12-06 ENCOUNTER — Encounter: Payer: Self-pay | Admitting: Internal Medicine

## 2013-12-08 ENCOUNTER — Other Ambulatory Visit: Payer: Self-pay | Admitting: Internal Medicine

## 2013-12-08 DIAGNOSIS — E1165 Type 2 diabetes mellitus with hyperglycemia: Principal | ICD-10-CM

## 2013-12-08 DIAGNOSIS — E1129 Type 2 diabetes mellitus with other diabetic kidney complication: Secondary | ICD-10-CM

## 2013-12-08 MED ORDER — INSULIN GLARGINE 100 UNIT/ML ~~LOC~~ SOLN: SUBCUTANEOUS | Status: DC

## 2013-12-25 LAB — HM DIABETES EYE EXAM

## 2013-12-30 ENCOUNTER — Other Ambulatory Visit: Payer: Self-pay | Admitting: Internal Medicine

## 2014-01-01 LAB — HM DIABETES EYE EXAM

## 2014-01-03 ENCOUNTER — Encounter: Payer: Self-pay | Admitting: Internal Medicine

## 2014-01-04 ENCOUNTER — Other Ambulatory Visit: Payer: Self-pay | Admitting: Internal Medicine

## 2014-01-06 ENCOUNTER — Other Ambulatory Visit: Payer: Self-pay | Admitting: Internal Medicine

## 2014-01-06 DIAGNOSIS — E1165 Type 2 diabetes mellitus with hyperglycemia: Principal | ICD-10-CM

## 2014-01-06 DIAGNOSIS — E1129 Type 2 diabetes mellitus with other diabetic kidney complication: Secondary | ICD-10-CM

## 2014-01-06 MED ORDER — CIPROFLOXACIN HCL 500 MG PO TABS: 500.0000 mg | ORAL_TABLET | Freq: Two times a day (BID) | ORAL | Status: AC

## 2014-01-13 ENCOUNTER — Ambulatory Visit (INDEPENDENT_AMBULATORY_CARE_PROVIDER_SITE_OTHER): Payer: Medicare PPO

## 2014-01-13 ENCOUNTER — Ambulatory Visit (INDEPENDENT_AMBULATORY_CARE_PROVIDER_SITE_OTHER): Payer: 59 | Admitting: Podiatry

## 2014-01-13 VITALS — BP 149/82 | HR 77 | Resp 16 | Ht 70.0 in | Wt 262.0 lb

## 2014-01-13 DIAGNOSIS — M779 Enthesopathy, unspecified: Secondary | ICD-10-CM

## 2014-01-13 DIAGNOSIS — R609 Edema, unspecified: Secondary | ICD-10-CM

## 2014-01-13 MED ORDER — TRIAMCINOLONE ACETONIDE 10 MG/ML IJ SUSP
10.0000 mg | Freq: Once | INTRAMUSCULAR | Status: AC
  Administered 2014-01-13: 10 mg

## 2014-01-13 NOTE — Progress Notes (Signed)
   Subjective:    Patient ID: Connor Dixon, male    DOB: January 07, 1935, 79 y.o.   MRN: [id number]  HPI Comments: Left foot and ankle has been swelling and getting pain in the foot, its been 7 months. Do a lot of traveling it swells more . Diabetic for many years. Last a1c 7.6  Foot Pain      Review of Systems  HENT:       Sinus problems   Respiratory: Positive for shortness of breath.   Cardiovascular: Positive for leg swelling.  Endocrine:       Diabetic   Genitourinary: Positive for difficulty urinating.  Musculoskeletal:       Joint pain  Back pain   Allergic/Immunologic: Positive for food allergies.  Hematological:       Slow to heal   All other systems reviewed and are negative.      Objective:   Physical Exam        Assessment & Plan:

## 2014-01-13 NOTE — Progress Notes (Signed)
Subjective:     Patient ID: Connor Dixon, male   DOB: [birth year], 79 y.o.   MRN: [id number]  HPI patient presents stating I been having pain in my left ankle that has made it hard for me to walk over the last few weeks   Review of Systems  All other systems reviewed and are negative.      Objective:   Physical Exam  Nursing note and vitals reviewed. Constitutional: He is oriented to person, place, and time.  Cardiovascular: Intact distal pulses.   Musculoskeletal: Normal range of motion.  Neurological: He is oriented to person, place, and time.  Skin: Skin is warm.   neurovascular status intact with muscle strength adequate and range of motion of the subtalar and midtarsal joint within normal limits. Patient's found to have edema in the lateral side of the left ankle and foot and is found to have exquisite discomfort upon palpation of the sinus tarsi left foot. Patient is noted to have well-developed arch and good digital perfusion and is well oriented x3     Assessment:     Probable inflammation with sinus tarsi inflammation left    Plan:     H&P and x-ray reviewed. Injected the sinus tarsi left 3 mg Kenalog 5 mg Xylocaine Marcaine mixture and instructed on physical therapy and compression with Ace wrap. His going on a trip to [facility] and will return after trip

## 2014-01-14 ENCOUNTER — Other Ambulatory Visit: Payer: Self-pay | Admitting: Family Medicine

## 2014-01-14 NOTE — Telephone Encounter (Signed)
Refill done.  

## 2014-01-15 ENCOUNTER — Other Ambulatory Visit: Payer: Self-pay | Admitting: *Deleted

## 2014-01-15 MED ORDER — FUROSEMIDE 40 MG PO TABS: ORAL_TABLET | ORAL | Status: DC

## 2014-02-03 ENCOUNTER — Ambulatory Visit (INDEPENDENT_AMBULATORY_CARE_PROVIDER_SITE_OTHER): Payer: Medicare PPO | Admitting: Podiatry

## 2014-02-03 ENCOUNTER — Encounter: Payer: Self-pay | Admitting: Podiatry

## 2014-02-03 VITALS — BP 154/80 | HR 74 | Resp 16

## 2014-02-03 DIAGNOSIS — R609 Edema, unspecified: Secondary | ICD-10-CM

## 2014-02-03 DIAGNOSIS — M779 Enthesopathy, unspecified: Secondary | ICD-10-CM

## 2014-02-03 NOTE — Progress Notes (Signed)
Subjective:     Patient ID: Connor Dixon, male   DOB: 1935-01-15, 79 y.o.   MRN: [id number]  HPI patient states that my foot is feeling a lot better and the swelling seems to have gone down   Review of Systems     Objective:   Physical Exam Neurovascular status intact with muscle strength adequate in range of motion within normal limits   patient is noted to have discomfort of a minimal nature sinus tarsi left lateral foot but significantly improved over previous visit Assessment:     Improved sinus tarsitis and lateral ankle pain and edema    Plan:     Advised on continuation of elevation compression and supportive shoe gear usage. Reappoint as needed

## 2014-02-10 ENCOUNTER — Encounter: Payer: Self-pay | Admitting: Internal Medicine

## 2014-02-10 ENCOUNTER — Ambulatory Visit (INDEPENDENT_AMBULATORY_CARE_PROVIDER_SITE_OTHER): Payer: Medicare FFS | Admitting: *Deleted

## 2014-02-10 DIAGNOSIS — I442 Atrioventricular block, complete: Secondary | ICD-10-CM

## 2014-02-10 LAB — MDC_IDC_ENUM_SESS_TYPE_REMOTE
Battery Remaining Longevity: 76 mo
Battery Remaining Percentage: 67 %
Battery Voltage: 2.93 V
Brady Statistic AP VP Percent: 1 %
Brady Statistic AS VP Percent: 98 %
Brady Statistic AS VS Percent: 1 %
Brady Statistic RV Percent Paced: 99 %
Date Time Interrogation Session: 20150928064714
Implantable Pulse Generator Model: 2210
Implantable Pulse Generator Serial Number: 7192434
Lead Channel Impedance Value: 450 Ohm
Lead Channel Pacing Threshold Amplitude: 0.625 V
Lead Channel Pacing Threshold Amplitude: 1 V
Lead Channel Pacing Threshold Pulse Width: 0.4 ms
Lead Channel Sensing Intrinsic Amplitude: 0.9 mV
MDC IDC MSMT LEADCHNL RV IMPEDANCE VALUE: 410 Ohm
MDC IDC MSMT LEADCHNL RV PACING THRESHOLD PULSEWIDTH: 0.4 ms
MDC IDC MSMT LEADCHNL RV SENSING INTR AMPL: 9.2 mV
MDC IDC SET LEADCHNL RA PACING AMPLITUDE: 1.625
MDC IDC SET LEADCHNL RV PACING AMPLITUDE: 2.5 V
MDC IDC SET LEADCHNL RV PACING PULSEWIDTH: 0.4 ms
MDC IDC SET LEADCHNL RV SENSING SENSITIVITY: 3 mV
MDC IDC STAT BRADY AP VS PERCENT: 1 %
MDC IDC STAT BRADY RA PERCENT PACED: 1 %

## 2014-02-10 NOTE — Progress Notes (Signed)
Remote pacemaker transmission.   

## 2014-02-26 ENCOUNTER — Ambulatory Visit: Payer: Medicare FFS | Admitting: Internal Medicine

## 2014-02-28 ENCOUNTER — Encounter: Payer: Self-pay | Admitting: Internal Medicine

## 2014-02-28 ENCOUNTER — Other Ambulatory Visit (INDEPENDENT_AMBULATORY_CARE_PROVIDER_SITE_OTHER): Payer: Medicare FFS

## 2014-02-28 ENCOUNTER — Encounter: Payer: Self-pay | Admitting: Cardiology

## 2014-02-28 ENCOUNTER — Ambulatory Visit (INDEPENDENT_AMBULATORY_CARE_PROVIDER_SITE_OTHER): Payer: Medicare FFS | Admitting: Internal Medicine

## 2014-02-28 DIAGNOSIS — E1021 Type 1 diabetes mellitus with diabetic nephropathy: Secondary | ICD-10-CM

## 2014-02-28 DIAGNOSIS — Z23 Encounter for immunization: Secondary | ICD-10-CM

## 2014-02-28 DIAGNOSIS — N183 Chronic kidney disease, stage 3 unspecified: Secondary | ICD-10-CM

## 2014-02-28 DIAGNOSIS — I10 Essential (primary) hypertension: Secondary | ICD-10-CM

## 2014-02-28 DIAGNOSIS — E1121 Type 2 diabetes mellitus with diabetic nephropathy: Secondary | ICD-10-CM

## 2014-02-28 LAB — BASIC METABOLIC PANEL
BUN: 30 mg/dL — AB (ref 6–23)
CHLORIDE: 104 meq/L (ref 96–112)
CO2: 24 mEq/L (ref 19–32)
CREATININE: 1.5 mg/dL (ref 0.4–1.5)
Calcium: 9.5 mg/dL (ref 8.4–10.5)
GFR: 57.46 mL/min — ABNORMAL LOW (ref >60.00)
Glucose, Bld: 88 mg/dL (ref 70–99)
Potassium: 4.4 mEq/L (ref 3.5–5.1)
Sodium: 139 mEq/L (ref 135–145)

## 2014-02-28 LAB — HM DIABETES FOOT EXAM: HM Diabetic Foot Exam: NORMAL

## 2014-02-28 LAB — HEMOGLOBIN A1C: HEMOGLOBIN A1C: 7.4 % — AB (ref 4.6–6.5)

## 2014-02-28 MED ORDER — GLUCOSE BLOOD VI STRP: ORAL_STRIP | Status: DC

## 2014-02-28 NOTE — Progress Notes (Signed)
Subjective:    Patient ID: Connor Dixon, male    DOB: 01/29/1935, 79 y.o.   MRN: [id number]  Diabetes He presents for his follow-up diabetic visit. He has type 1 diabetes mellitus. His disease course has been stable. There are no hypoglycemic associated symptoms. Pertinent negatives for hypoglycemia include no dizziness, headaches or tremors. Pertinent negatives for diabetes include no blurred vision, no chest pain, no fatigue, no foot paresthesias, no foot ulcerations, no polydipsia, no polyphagia, no polyuria, no visual change, no weakness and no weight loss. Symptoms are stable. There are no diabetic complications. There are no known risk factors for coronary artery disease. Current diabetic treatment includes intensive insulin program and insulin injections. He is compliant with treatment most of the time. His weight is stable. He is following a generally healthy diet. Meal planning includes avoidance of concentrated sweets. He has not had a previous visit with a dietician. He participates in exercise intermittently. There is no change in his home blood glucose trend. His breakfast blood glucose range is generally 70-90 mg/dl. His lunch blood glucose range is generally 90-110 mg/dl. His dinner blood glucose range is generally 110-130 mg/dl. His highest blood glucose is 110-130 mg/dl. His overall blood glucose range is 90-110 mg/dl. An ACE inhibitor/angiotensin II receptor blocker is being taken. He does not see a podiatrist.Eye exam is not current.      Review of Systems  Constitutional: Positive for unexpected weight change (some weight gain). Negative for fever, chills, weight loss, diaphoresis, activity change, appetite change and fatigue.  HENT: Negative.   Eyes: Negative.  Negative for blurred vision.  Respiratory: Negative.  Negative for cough, choking, chest tightness, shortness of breath and stridor.   Cardiovascular: Negative.  Negative for chest pain, palpitations and leg swelling.    Gastrointestinal: Negative.  Negative for nausea, vomiting, abdominal pain, diarrhea, constipation, blood in stool and rectal pain.  Endocrine: Negative.  Negative for polydipsia, polyphagia and polyuria.  Genitourinary: Negative.   Musculoskeletal: Negative.  Negative for arthralgias, back pain, joint swelling and myalgias.  Skin: Negative.  Negative for rash.  Allergic/Immunologic: Negative.   Neurological: Negative.  Negative for dizziness, tremors, syncope, weakness, light-headedness, numbness and headaches.  Hematological: Negative.  Negative for adenopathy. Does not bruise/bleed easily.  Psychiatric/Behavioral: Negative.        Objective:   Physical Exam  Vitals reviewed. Constitutional: He is oriented to person, place, and time. He appears well-developed and well-nourished. No distress.  HENT:  Head: Normocephalic and atraumatic.  Mouth/Throat: Oropharynx is clear and moist. No oropharyngeal exudate.  Eyes: Conjunctivae are normal. Right eye exhibits no discharge. Left eye exhibits no discharge. No scleral icterus.  Neck: Normal range of motion. Neck supple. No JVD present. No tracheal deviation present. No thyromegaly present.  Cardiovascular: Normal rate, regular rhythm, S1 normal, S2 normal and intact distal pulses.  Exam reveals no gallop, no S3, no S4, no distant heart sounds and no friction rub.   Murmur heard.  Decrescendo systolic murmur is present with a grade of 1/6   No diastolic murmur is present  Pulmonary/Chest: Effort normal and breath sounds normal. No stridor. No respiratory distress. He has no wheezes. He has no rales. He exhibits no tenderness.  Abdominal: Soft. Bowel sounds are normal. He exhibits no distension. There is no tenderness. There is no rebound and no guarding.  Musculoskeletal: Normal range of motion. He exhibits no edema and no tenderness.  Lymphadenopathy:    He has no cervical adenopathy.  Neurological:  He is oriented to person, place, and  time.  Skin: Skin is warm and dry. No rash noted. He is not diaphoretic. No erythema. No pallor.     Lab Results  Component Value Date   WBC 5.8 10/22/2013   HGB 15.1 10/22/2013   HCT 45.6 10/22/2013   PLT 211.0 10/22/2013   GLUCOSE 65* 10/22/2013   CHOL 136 10/22/2013   TRIG 77.0 10/22/2013   HDL 46.40 10/22/2013   LDLCALC 74 10/22/2013   ALT 20 10/22/2013   AST 26 10/22/2013   NA 139 10/22/2013   K 4.3 10/22/2013   CL 105 10/22/2013   CREATININE 1.7* 10/22/2013   BUN 30* 10/22/2013   CO2 29 10/22/2013   TSH 1.32 10/22/2013   PSA 1.77 01/06/2010   HGBA1C 7.6* 10/22/2013   MICROALBUR 44.9* 08/06/2007       Assessment & Plan:

## 2014-02-28 NOTE — Patient Instructions (Signed)
Diabetes Mellitus and Food It is important for you to manage your blood sugar (glucose) level. Your blood glucose level can be greatly affected by what you eat. Eating healthier foods in the appropriate amounts throughout the day at about the same time each day will help you control your blood glucose level. It can also help slow or prevent worsening of your diabetes mellitus. Healthy eating may even help you improve the level of your blood pressure and reach or maintain a healthy weight.  HOW CAN FOOD AFFECT ME? Carbohydrates Carbohydrates affect your blood glucose level more than any other type of food. Your dietitian will help you determine how many carbohydrates to eat at each meal and teach you how to count carbohydrates. Counting carbohydrates is important to keep your blood glucose at a healthy level, especially if you are using insulin or taking certain medicines for diabetes mellitus. Alcohol Alcohol can cause sudden decreases in blood glucose (hypoglycemia), especially if you use insulin or take certain medicines for diabetes mellitus. Hypoglycemia can be a life-threatening condition. Symptoms of hypoglycemia (sleepiness, dizziness, and disorientation) are similar to symptoms of having too much alcohol.  If your health care provider has given you approval to drink alcohol, do so in moderation and use the following guidelines:  Women should not have more than one drink per day, and men should not have more than two drinks per day. One drink is equal to:  12 oz of beer.  5 oz of wine.  1 oz of hard liquor.  Do not drink on an empty stomach.  Keep yourself hydrated. Have water, diet soda, or unsweetened iced tea.  Regular soda, juice, and other mixers might contain a lot of carbohydrates and should be counted. WHAT FOODS ARE NOT RECOMMENDED? As you make food choices, it is important to remember that all foods are not the same. Some foods have fewer nutrients per serving than other  foods, even though they might have the same number of calories or carbohydrates. It is difficult to get your body what it needs when you eat foods with fewer nutrients. Examples of foods that you should avoid that are high in calories and carbohydrates but low in nutrients include:  Trans fats (most processed foods list trans fats on the Nutrition Facts label).  Regular soda.  Juice.  Candy.  Sweets, such as cake, pie, doughnuts, and cookies.  Fried foods. WHAT FOODS CAN I EAT? Have nutrient-rich foods, which will nourish your body and keep you healthy. The food you should eat also will depend on several factors, including:  The calories you need.  The medicines you take.  Your weight.  Your blood glucose level.  Your blood pressure level.  Your cholesterol level. You also should eat a variety of foods, including:  Protein, such as meat, poultry, fish, tofu, nuts, and seeds (lean animal proteins are best).  Fruits.  Vegetables.  Dairy products, such as milk, cheese, and yogurt (low fat is best).  Breads, grains, pasta, cereal, rice, and beans.  Fats such as olive oil, trans fat-free margarine, canola oil, avocado, and olives. DOES EVERYONE WITH DIABETES MELLITUS HAVE THE SAME MEAL PLAN? Because every person with diabetes mellitus is different, there is not one meal plan that works for everyone. It is very important that you meet with a dietitian who will help you create a meal plan that is just right for you. Document Released: 01/27/2005 Document Revised: 05/07/2013 Document Reviewed: 03/29/2013 Exit[employer] Patient Information 2015 [employer]. This   information is not intended to replace advice given to you by your health care provider. Make sure you discuss any questions you have with your health care provider.  

## 2014-03-03 ENCOUNTER — Telehealth: Payer: Self-pay | Admitting: Internal Medicine

## 2014-03-03 MED ORDER — INSULIN GLARGINE 100 UNIT/ML ~~LOC~~ SOLN: SUBCUTANEOUS | Status: DC

## 2014-03-03 NOTE — Assessment & Plan Note (Signed)
His BP is well controlled 

## 2014-03-03 NOTE — Assessment & Plan Note (Signed)
His renal function is stable He will continue to avoid nephrotoxic agents

## 2014-03-03 NOTE — Telephone Encounter (Signed)
emmi emailed °

## 2014-03-03 NOTE — Assessment & Plan Note (Signed)
His blood sugars are well controlled He will continue to work on his lifestyle modifications

## 2014-04-01 ENCOUNTER — Other Ambulatory Visit: Payer: Self-pay | Admitting: Family Medicine

## 2014-04-02 NOTE — Telephone Encounter (Signed)
Refill done.  

## 2014-04-07 ENCOUNTER — Other Ambulatory Visit: Payer: Self-pay | Admitting: *Deleted

## 2014-04-07 MED ORDER — INSULIN NPH ISOPHANE & REGULAR (70-30) 100 UNIT/ML ~~LOC~~ SUSP: SUBCUTANEOUS | Status: DC

## 2014-04-07 NOTE — Telephone Encounter (Signed)
Please advise refill? Last filled by [facility]. 

## 2014-04-11 ENCOUNTER — Other Ambulatory Visit: Payer: Self-pay | Admitting: Family Medicine

## 2014-04-14 NOTE — Telephone Encounter (Signed)
30-day supply. Pt needs an appt if he would like dr Katrinka Blazingsmith to continue to refill this.

## 2014-04-26 ENCOUNTER — Other Ambulatory Visit: Payer: Self-pay | Admitting: Internal Medicine

## 2014-05-11 ENCOUNTER — Other Ambulatory Visit: Payer: Self-pay | Admitting: Internal Medicine

## 2014-05-13 ENCOUNTER — Ambulatory Visit (INDEPENDENT_AMBULATORY_CARE_PROVIDER_SITE_OTHER): Payer: Medicare FFS | Admitting: Internal Medicine

## 2014-05-13 ENCOUNTER — Encounter: Payer: Self-pay | Admitting: Internal Medicine

## 2014-05-13 VITALS — BP 142/82 | HR 76 | Ht 69.0 in | Wt 267.0 lb

## 2014-05-13 DIAGNOSIS — Z950 Presence of cardiac pacemaker: Secondary | ICD-10-CM

## 2014-05-13 DIAGNOSIS — I10 Essential (primary) hypertension: Secondary | ICD-10-CM

## 2014-05-13 DIAGNOSIS — I442 Atrioventricular block, complete: Secondary | ICD-10-CM

## 2014-05-13 LAB — MDC_IDC_ENUM_SESS_TYPE_INCLINIC
Battery Remaining Longevity: 106.8 mo
Battery Voltage: 2.93 V
Brady Statistic RA Percent Paced: 0.6 %
Date Time Interrogation Session: 20151229161230
Implantable Pulse Generator Model: 2210
Implantable Pulse Generator Serial Number: 7192434
Lead Channel Impedance Value: 475 Ohm
Lead Channel Pacing Threshold Amplitude: 1 V
Lead Channel Pacing Threshold Pulse Width: 0.4 ms
Lead Channel Pacing Threshold Pulse Width: 0.4 ms
Lead Channel Sensing Intrinsic Amplitude: 1.8 mV
Lead Channel Sensing Intrinsic Amplitude: 9.1 mV
Lead Channel Setting Pacing Amplitude: 1.25 V
Lead Channel Setting Pacing Amplitude: 2 V
Lead Channel Setting Pacing Pulse Width: 0.4 ms
MDC IDC MSMT LEADCHNL RA PACING THRESHOLD AMPLITUDE: 0.625 V
MDC IDC MSMT LEADCHNL RV IMPEDANCE VALUE: 437.5 Ohm
MDC IDC MSMT LEADCHNL RV PACING THRESHOLD AMPLITUDE: 1 V
MDC IDC MSMT LEADCHNL RV PACING THRESHOLD PULSEWIDTH: 0.4 ms
MDC IDC SET LEADCHNL RV SENSING SENSITIVITY: 3 mV
MDC IDC STAT BRADY RV PERCENT PACED: 99.28 %

## 2014-05-13 NOTE — Patient Instructions (Signed)
Your physician wants you to follow-up in: 12 months with Dr. Taylor. You will receive a reminder letter in the mail two months in advance. If you don't receive a letter, please call our office to schedule the follow-up appointment.  Remote monitoring is used to monitor your Pacemaker or ICD from home. This monitoring reduces the number of office visits required to check your device to one time per year. It allows us to keep an eye on the functioning of your device to ensure it is working properly. You are scheduled for a device check from home on 08/12/14. You may send your transmission at any time that day. If you have a wireless device, the transmission will be sent automatically. After your physician reviews your transmission, you will receive a postcard with your next transmission date.    

## 2014-05-13 NOTE — Assessment & Plan Note (Signed)
His St. Jude DDD PM is working normally. Will recheck in several months. 

## 2014-05-13 NOTE — Progress Notes (Signed)
HPI [name] he is a very pleasant morbidly obese 79 year old man with a history of complete heart block, status post permanent pacemaker insertion. He also has a history of diabetes, hypertension, and peripheral edema. He remains active, working in his yard. No syncope. No chest pain or shortness of breath.  Allergies  Allergen Reactions  . Oysters [Shellfish Allergy]     Blisters and swelling  . Apple     Sore throat     Current Outpatient Prescriptions  Medication Sig Dispense Refill  . allopurinol (ZYLOPRIM) 100 MG tablet TAKE 1 TABLET BY MOUTH EVERY DAY 30 tablet 0  . aspirin EC 81 MG tablet Take 81 mg by mouth daily.    . Blood Glucose Monitoring Suppl (FREESTYLE LITE) DEVI by Does not apply route 5 (five) times daily.    . carvedilol (COREG) 6.25 MG tablet TAKE 1 TABLET BY MOUTH TWICE DAILY 60 tablet 0  . furosemide (LASIX) 40 MG tablet TAKE 1 TABLET BY MOUTH EVERY MORNING AND 1/2 TABLET BY MOUTH AT 1 PM 45 tablet 0  . glucose blood (ONETOUCH VERIO) test strip Test five times daily Dx:E11.21 (Patient taking differently: Test five times daily Dx:E11.21 ONE TOUCH - Verio) 500 each 3  . insulin glargine (LANTUS) 100 UNIT/ML injection INJECT 25 UNITS UNDER THE SKIN EACH NIGHT AS DIRECTED BY YOUR PHYSICIAN 10 mL 3  . insulin NPH-regular Human (HUMULIN 70/30) (70-30) 100 UNIT/ML injection INJECT 65 UNITS AS DIRECTED 60 mL 5  . Insulin Pen Needle (BD ULTRA-FINE PEN NEEDLES) 29G X 12.7MM MISC USE AS DIRECTED WITH INSULIN PENS. 120 each 5  . Insulin Syringe-Needle U-100 31G X 5/16" 0.5 ML MISC Use Insulin needles 4 times daily qty 120 120 each 11  . Lancets (FREESTYLE) lancets testing 5 times a day  Dx 250.01 500 each 3  . loratadine (CLARITIN) 10 MG tablet Take 10 mg by mouth daily as needed for allergies.     Marland Kitchen. losartan (COZAAR) 50 MG tablet TAKE 1/2 TABLET BY MOUTH EVERY DAY 45 tablet 11  . pravastatin (PRAVACHOL) 40 MG tablet TAKE 1 TABLET BY MOUTH EVERY DAY 90 tablet 1  . amoxicillin  (AMOXIL) 500 MG capsule Take ONLY when having dental procedures     No current facility-administered medications for this visit.     Past Medical History  Diagnosis Date  . Atrioventricular block, complete   . Hypopotassemia   . Other and unspecified angina pectoris     typical  . Arthus phenomenon   . Morbid obesity   . Essential hypertension, benign 08/06/2007    Very good BP control  . HLD (hyperlipidemia)   . DM (diabetes mellitus)     adult onset   . Cardiac pacemaker in situ 07/22/2010  . DIABETES MELLITUS, TYPE I, ADULT ONSET 08/06/2007  . History of second degree heart block   . HYPERLIPIDEMIA 08/06/2007  . Obesity   . Chronic kidney disease, stage 3     Presumably from longstanding DM & HTN. Scr has gradually increased over the past 2 years (1.6 in 12/2010, 2 in 03/2011, and up to 2.1 in 01/2013)  . Acute on chronic diastolic congestive heart failure   . Type I (juvenile type) diabetes mellitus without mention of complication, not stated as uncontrolled     (04/25/13 OV with Dr. Arrie Aranoladonato) = Poorly controlled & is working  w/Dr. Arthur HolmsNorrins to improve this. Most recent Hgb A1c was 8.2% but [name] reports it was down to 7.6 last month.  ROS:   All systems reviewed and negative except as noted in the HPI.   Past Surgical History  Procedure Laterality Date  . Total hip arthroplasty  04/05/06    right  . Cataract extraction  8/08    [facility]  . Ptvdp  12/11    PPM - [employer]     Family History  Problem Relation Age of Onset  . Colon cancer Neg Hx     no definate prostate CA   . Asthma Father   . Coronary artery disease Father   . Diabetes Father   . Hyperlipidemia Father   . Coronary artery disease Mother   . Asthma Mother   . Heart attack Mother [id number]  . Stroke Father 183     History   Social History  . Marital Status: Married    Spouse Name: N/A    Number of Children: N/A  . Years of Education: N/A   Occupational History  . Not on file.    Social History Main Topics  . Smoking status: Never Smoker   . Smokeless tobacco: Never Used  . Alcohol Use: No  . Drug Use: No  . Sexual Activity: Not Currently   Other Topics Concern  . Not on file   Social History Narrative   [employer] BA, 2 Master's degrees. [place] - for certification Education specialist.    Retired Animal nutritionistschool principle.   Active - travels.   Married - 1961; 2 sons (1962 and [id number]) and 5 grandchildren.        Veterinary surgeonDesignated Party Release on file. Toll BrothersLatoya Battle. 01/06/10.      BP 142/82 mmHg  Pulse 76  Ht 5\' 9"  (1.753 m)  Wt 267 lb (121.11 kg)  BMI 39.41 kg/m2  Physical Exam:  Well appearing obese 79 year old man, NAD HEENT: Unremarkable Neck:  7 cm JVD, no thyromegally Lungs:  Clear with no wheezes, rales, or rhonchi. HEART:  Regular rate rhythm, no murmurs, no rubs, no clicks Abd:  soft, positive bowel sounds, no organomegally, no rebound, no guarding Ext:  2 plus pulses, no edema, no cyanosis, no clubbing Skin:  No rashes no nodules Neuro:  CN II through XII intact, motor grossly intact   DEVICE  Normal device function.  See PaceArt for details.   Assess/Plan:

## 2014-05-13 NOTE — Assessment & Plan Note (Signed)
His blood pressure is reasonably well controlled. He has been asked to lose weight and reduce his sodium intake.

## 2014-05-13 NOTE — Assessment & Plan Note (Signed)
He is encouraged to eat less and exercise more. No other recommendations

## 2014-05-24 ENCOUNTER — Other Ambulatory Visit: Payer: Self-pay | Admitting: Family Medicine

## 2014-05-26 NOTE — Telephone Encounter (Signed)
Refill done. Pt must be seen by dr Katrinka Blazingsmith if the pt would like to continue to have this med refilled.

## 2014-05-30 ENCOUNTER — Other Ambulatory Visit: Payer: Self-pay

## 2014-05-30 MED ORDER — FUROSEMIDE 40 MG PO TABS: ORAL_TABLET | ORAL | Status: DC

## 2014-06-11 ENCOUNTER — Other Ambulatory Visit: Payer: Self-pay | Admitting: Cardiology

## 2014-06-20 ENCOUNTER — Other Ambulatory Visit: Payer: Self-pay | Admitting: Internal Medicine

## 2014-06-20 ENCOUNTER — Encounter: Payer: Self-pay | Admitting: Internal Medicine

## 2014-06-20 MED ORDER — LOSARTAN POTASSIUM 50 MG PO TABS: 25.0000 mg | ORAL_TABLET | Freq: Every day | ORAL | Status: DC

## 2014-06-23 ENCOUNTER — Other Ambulatory Visit: Payer: Self-pay | Admitting: Family Medicine

## 2014-06-24 NOTE — Telephone Encounter (Signed)
Refill done.  

## 2014-06-26 ENCOUNTER — Other Ambulatory Visit: Payer: Self-pay | Admitting: Internal Medicine

## 2014-07-02 ENCOUNTER — Other Ambulatory Visit (INDEPENDENT_AMBULATORY_CARE_PROVIDER_SITE_OTHER): Payer: Medicare FFS

## 2014-07-02 ENCOUNTER — Encounter: Payer: Self-pay | Admitting: Internal Medicine

## 2014-07-02 ENCOUNTER — Ambulatory Visit (INDEPENDENT_AMBULATORY_CARE_PROVIDER_SITE_OTHER): Payer: Medicare FFS | Admitting: Internal Medicine

## 2014-07-02 ENCOUNTER — Ambulatory Visit: Payer: Medicare FFS

## 2014-07-02 VITALS — BP 138/82 | HR 74 | Temp 97.8°F | Resp 16 | Wt 270.0 lb

## 2014-07-02 DIAGNOSIS — N183 Chronic kidney disease, stage 3 unspecified: Secondary | ICD-10-CM

## 2014-07-02 DIAGNOSIS — E785 Hyperlipidemia, unspecified: Secondary | ICD-10-CM

## 2014-07-02 DIAGNOSIS — M1A3790 Chronic gout due to renal impairment, unspecified ankle and foot, without tophus (tophi): Secondary | ICD-10-CM

## 2014-07-02 DIAGNOSIS — I10 Essential (primary) hypertension: Secondary | ICD-10-CM

## 2014-07-02 DIAGNOSIS — E1121 Type 2 diabetes mellitus with diabetic nephropathy: Secondary | ICD-10-CM

## 2014-07-02 LAB — URINALYSIS, ROUTINE W REFLEX MICROSCOPIC
Bilirubin Urine: NEGATIVE
KETONES UR: NEGATIVE
Leukocytes, UA: NEGATIVE
Nitrite: NEGATIVE
PH: 5.5 (ref 5.0–8.0)
SPECIFIC GRAVITY, URINE: 1.015 (ref 1.000–1.030)
Total Protein, Urine: 30 — AB
UROBILINOGEN UA: 0.2 (ref 0.0–1.0)
Urine Glucose: NEGATIVE
WBC UA: NONE SEEN (ref 0–?)

## 2014-07-02 LAB — MICROALBUMIN / CREATININE URINE RATIO
Creatinine,U: 41.5 mg/dL
Microalb Creat Ratio: 110.3 mg/g — ABNORMAL HIGH (ref 0.0–30.0)
Microalb, Ur: 45.8 mg/dL — ABNORMAL HIGH (ref 0.0–1.9)

## 2014-07-02 LAB — BASIC METABOLIC PANEL
BUN: 24 mg/dL — ABNORMAL HIGH (ref 6–23)
CHLORIDE: 102 meq/L (ref 96–112)
CO2: 26 mEq/L (ref 19–32)
Calcium: 9.6 mg/dL (ref 8.4–10.5)
Creatinine, Ser: 1.5 mg/dL (ref 0.40–1.50)
GFR: 58.29 mL/min — ABNORMAL LOW (ref >60.00)
GLUCOSE: 126 mg/dL — AB (ref 70–99)
Potassium: 4.4 mEq/L (ref 3.5–5.1)
SODIUM: 137 meq/L (ref 135–145)

## 2014-07-02 LAB — URIC ACID: Uric Acid, Serum: 5.5 mg/dL (ref 4.0–7.8)

## 2014-07-02 LAB — LIPID PANEL
Cholesterol: 158 mg/dL (ref 0–200)
HDL: 46 mg/dL (ref >39.00)
LDL Cholesterol: 93 mg/dL (ref 0–99)
NonHDL: 112
TRIGLYCERIDES: 93 mg/dL (ref 0.0–149.0)
Total CHOL/HDL Ratio: 3
VLDL: 18.6 mg/dL (ref 0.0–40.0)

## 2014-07-02 LAB — HEMOGLOBIN A1C: Hgb A1c MFr Bld: 7.3 % — ABNORMAL HIGH (ref 4.6–6.5)

## 2014-07-02 MED ORDER — ALLOPURINOL 100 MG PO TABS: 100.0000 mg | ORAL_TABLET | Freq: Every day | ORAL | Status: DC

## 2014-07-02 MED ORDER — CIPROFLOXACIN HCL 500 MG PO TABS: 500.0000 mg | ORAL_TABLET | Freq: Two times a day (BID) | ORAL | Status: AC

## 2014-07-02 MED ORDER — CIPROFLOXACIN HCL 500 MG PO TABS: 500.0000 mg | ORAL_TABLET | Freq: Two times a day (BID) | ORAL | Status: DC

## 2014-07-02 NOTE — Patient Instructions (Signed)

## 2014-07-02 NOTE — Assessment & Plan Note (Signed)
He is doing well on the statin Will check his FLP to see if he has achieved his LDL goal, will add zetia if indicated

## 2014-07-02 NOTE — Progress Notes (Signed)
Pre visit review using our clinic review tool, if applicable. No additional management support is needed unless otherwise documented below in the visit note. 

## 2014-07-02 NOTE — Assessment & Plan Note (Signed)
He reports good control of his blood sugars Will recheck his A1C today and will advise further Will also monitor his renal function

## 2014-07-02 NOTE — Assessment & Plan Note (Signed)
His BP is well controlled I will recheck his lytes and renal function today 

## 2014-07-02 NOTE — Assessment & Plan Note (Signed)
He will avoid nephrotoxic agents Will work on control of his BP and BS Will recheck his renal function today and will advise further if needed

## 2014-07-02 NOTE — Progress Notes (Signed)
Subjective:    Patient ID: Connor Dixon, male    DOB: 12/14/1934, 80 y.o.   MRN: [id number]  Hypertension This is a chronic problem. The current episode started more than 1 year ago. The problem is controlled. Associated symptoms include peripheral edema. Pertinent negatives include no anxiety, blurred vision, chest pain, headaches, malaise/fatigue, neck pain, orthopnea, palpitations, PND, shortness of breath or sweats. There are no associated agents to hypertension. Past treatments include diuretics and angiotensin blockers. The current treatment provides moderate improvement. Compliance problems include diet and exercise.  Hypertensive end-organ damage includes kidney disease. Identifiable causes of hypertension include chronic renal disease.      Review of Systems  Constitutional: Negative.  Negative for fever, chills, malaise/fatigue, diaphoresis, appetite change and fatigue.  HENT: Negative.   Eyes: Negative.  Negative for blurred vision.  Respiratory: Negative.  Negative for cough, choking, chest tightness, shortness of breath and stridor.   Cardiovascular: Negative.  Negative for chest pain, palpitations, orthopnea, leg swelling and PND.  Gastrointestinal: Negative.  Negative for nausea, vomiting, abdominal pain, diarrhea, constipation and blood in stool.  Endocrine: Negative.   Genitourinary: Negative.  Negative for dysuria, urgency, frequency, hematuria, decreased urine volume, enuresis and difficulty urinating.  Musculoskeletal: Negative.  Negative for myalgias, back pain, joint swelling, arthralgias and neck pain.  Skin: Negative.  Negative for rash.  Allergic/Immunologic: Negative.   Neurological: Negative.  Negative for dizziness, syncope, speech difficulty, weakness, light-headedness, numbness and headaches.  Hematological: Negative.  Negative for adenopathy. Does not bruise/bleed easily.  Psychiatric/Behavioral: Negative.        Objective:   Physical Exam    Constitutional: He is oriented to person, place, and time. He appears well-developed and well-nourished. No distress.  HENT:  Head: Normocephalic and atraumatic.  Mouth/Throat: Oropharynx is clear and moist. No oropharyngeal exudate.  Eyes: Conjunctivae are normal. Right eye exhibits no discharge. Left eye exhibits no discharge. No scleral icterus.  Neck: Normal range of motion. Neck supple. No JVD present. No tracheal deviation present. No thyromegaly present.  Cardiovascular: Normal rate, regular rhythm, normal heart sounds and intact distal pulses.  Exam reveals no gallop and no friction rub.   No murmur heard. Pulmonary/Chest: Effort normal and breath sounds normal. No stridor. No respiratory distress. He has no wheezes. He has no rales. He exhibits no tenderness.  Abdominal: Soft. Bowel sounds are normal. He exhibits no distension and no mass. There is no tenderness. There is no rebound and no guarding.  Musculoskeletal: Normal range of motion. He exhibits edema (trace edema in BLE). He exhibits no tenderness.  Lymphadenopathy:    He has no cervical adenopathy.  Neurological: He is oriented to person, place, and time.  Skin: Skin is warm and dry. No rash noted. He is not diaphoretic. No erythema.  Nursing note and vitals reviewed.     Lab Results  Component Value Date   WBC 5.8 10/22/2013   HGB 15.1 10/22/2013   HCT 45.6 10/22/2013   PLT 211.0 10/22/2013   GLUCOSE 88 02/28/2014   CHOL 136 10/22/2013   TRIG 77.0 10/22/2013   HDL 46.40 10/22/2013   LDLCALC 74 10/22/2013   ALT 20 10/22/2013   AST 26 10/22/2013   NA 139 02/28/2014   K 4.4 02/28/2014   CL 104 02/28/2014   CREATININE 1.5 02/28/2014   BUN 30* 02/28/2014   CO2 24 02/28/2014   TSH 1.32 10/22/2013   PSA 1.77 01/06/2010   HGBA1C 7.4* 02/28/2014   MICROALBUR 44.9* 08/06/2007  Assessment & Plan:

## 2014-07-02 NOTE — Assessment & Plan Note (Signed)
This has resolved He is doing well on allopurinol Will recheck his uric acid level and will monitor his renal function today

## 2014-07-03 ENCOUNTER — Encounter: Payer: Self-pay | Admitting: Internal Medicine

## 2014-07-08 ENCOUNTER — Encounter: Payer: Self-pay | Admitting: Internal Medicine

## 2014-07-09 ENCOUNTER — Other Ambulatory Visit: Payer: Self-pay | Admitting: Internal Medicine

## 2014-07-09 DIAGNOSIS — E1121 Type 2 diabetes mellitus with diabetic nephropathy: Secondary | ICD-10-CM

## 2014-07-09 MED ORDER — INSULIN NPH ISOPHANE & REGULAR (70-30) 100 UNIT/ML ~~LOC~~ SUSP: SUBCUTANEOUS | Status: DC

## 2014-07-28 LAB — HM DIABETES EYE EXAM

## 2014-08-06 ENCOUNTER — Other Ambulatory Visit: Payer: Self-pay | Admitting: Internal Medicine

## 2014-08-10 IMAGING — US US RENAL
1 series · 14 of 25 positions shown · non-contrast
Comparison: None.

CLINICAL DATA: Chronic kidney disease

EXAM:
RENAL/URINARY TRACT ULTRASOUND COMPLETE

[Series 1: us renal · 0.32mm/px · 14 of 37 slices shown]
[im 1/37]
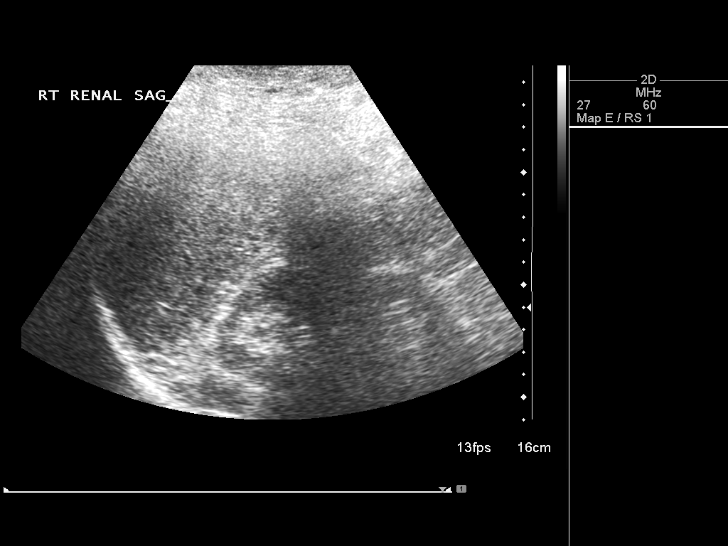
[im 4/37]
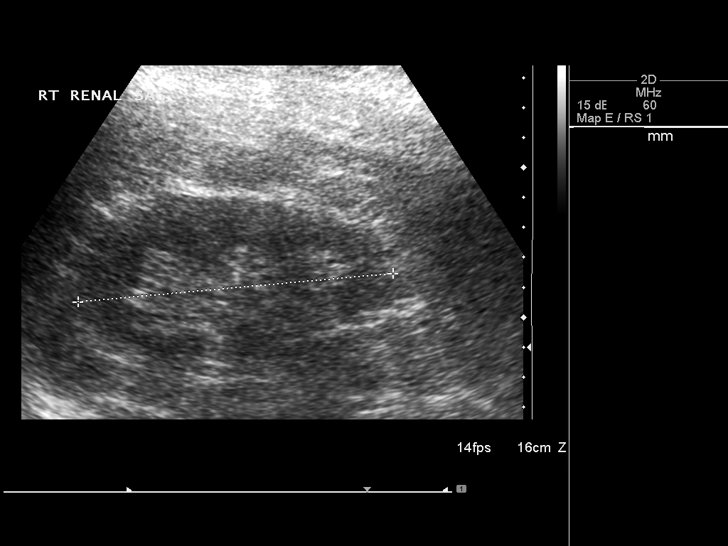
[im 7/37]
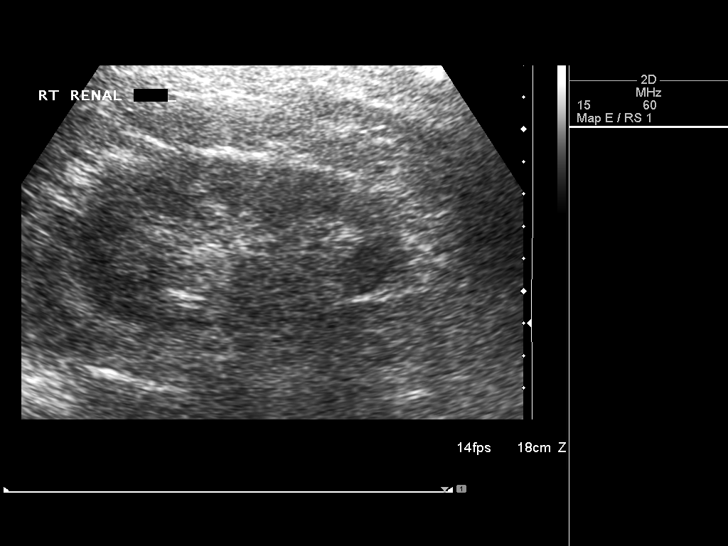
[im 10/37]
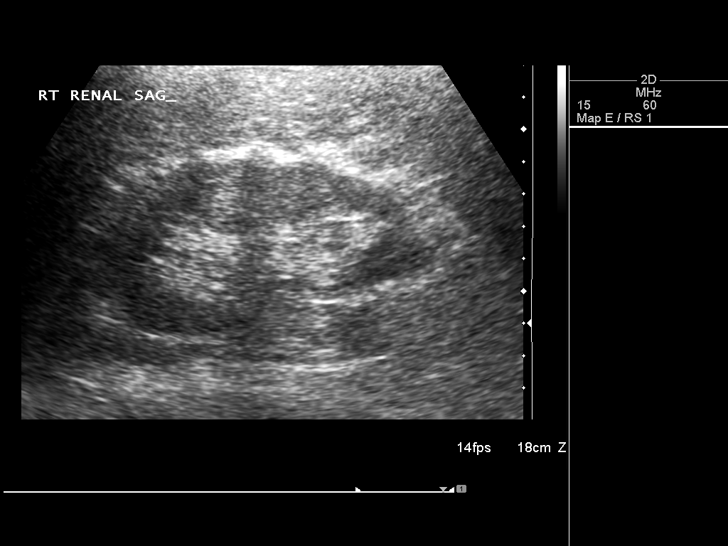
[im 13/37]
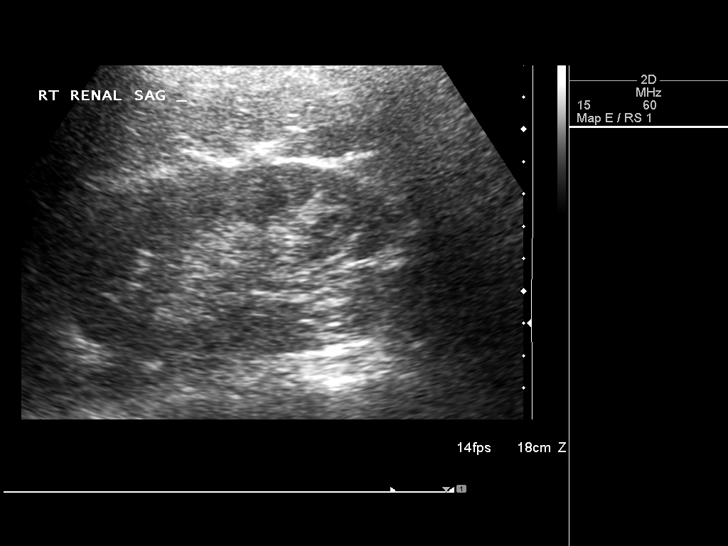
[im 14/37]
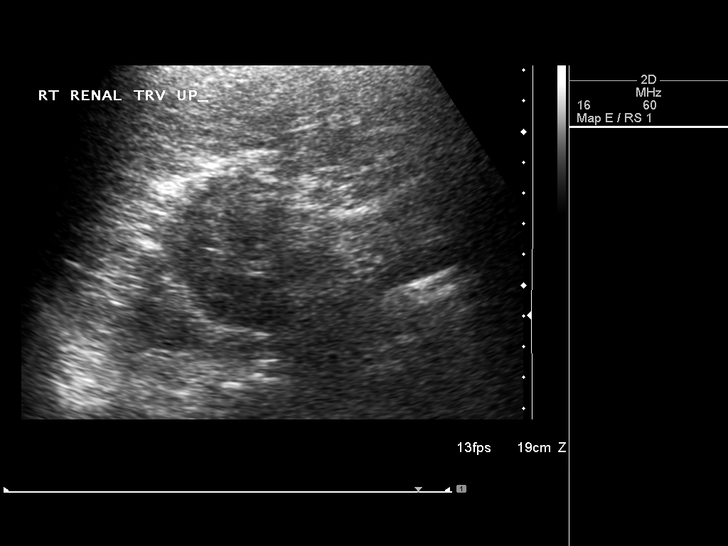
[im 17/37]
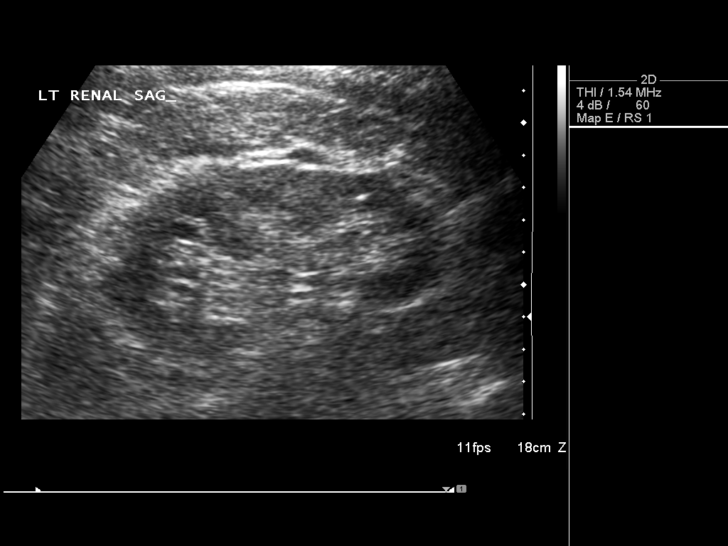
[im 20/37]
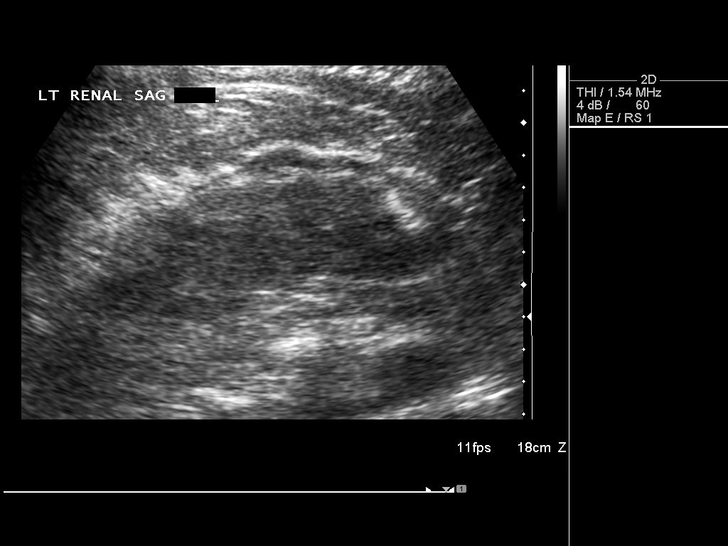
[im 23/37]
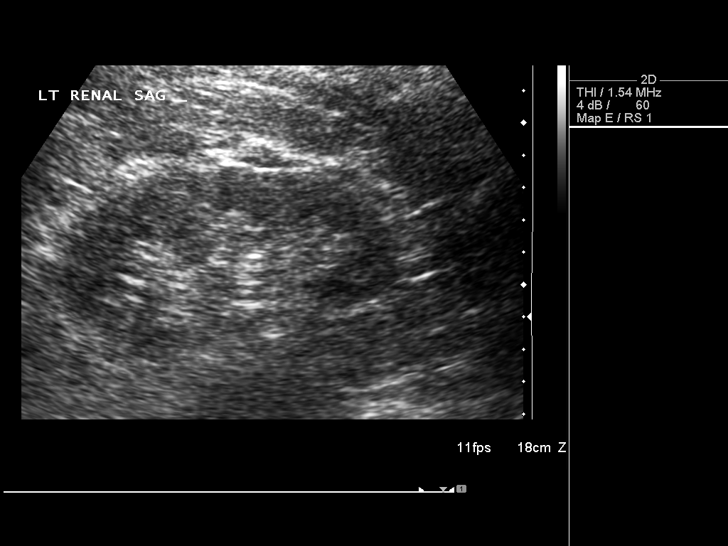
[im 25/37]
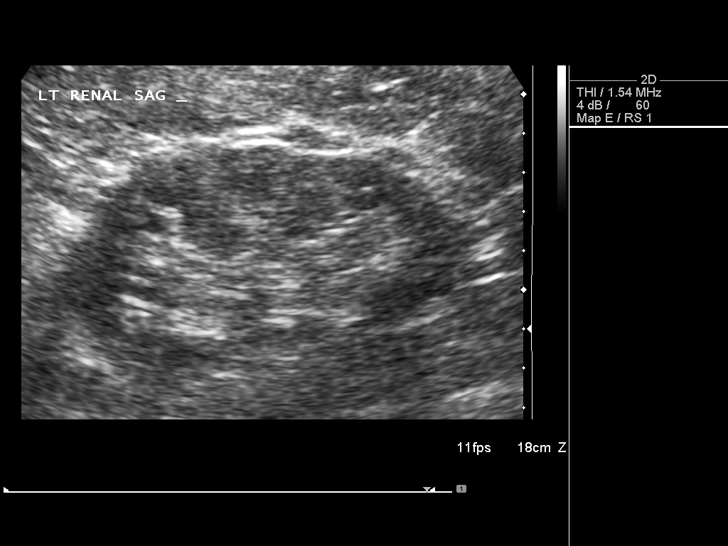
[im 28/37]
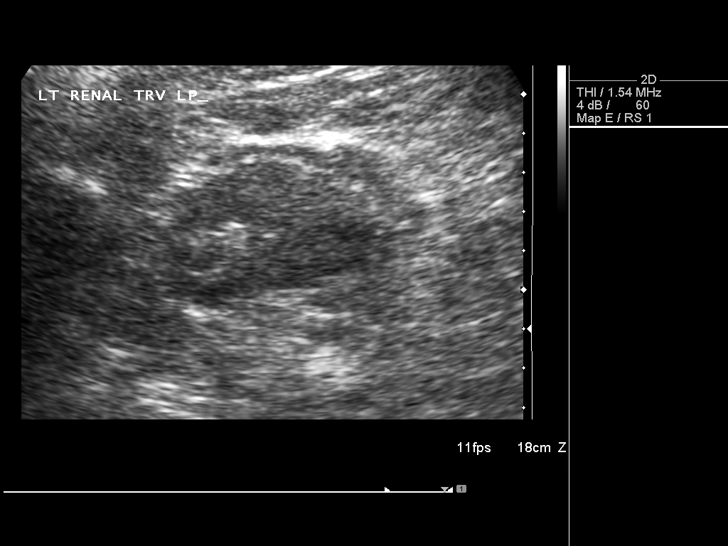
[im 31/37]
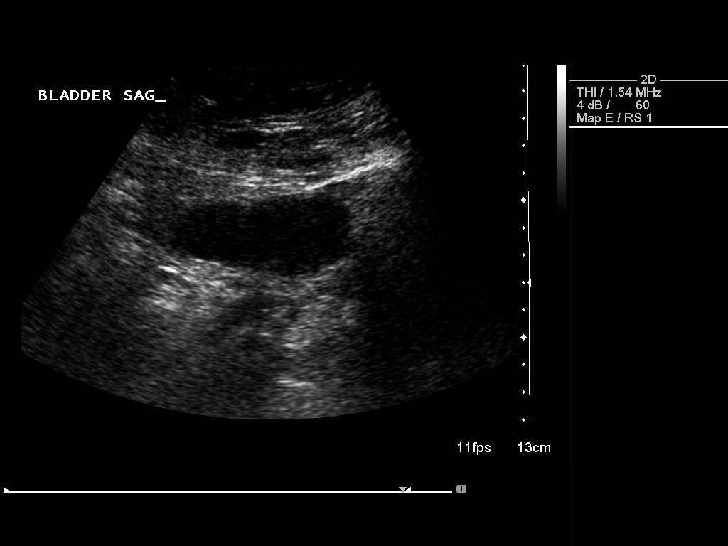
[im 34/37]
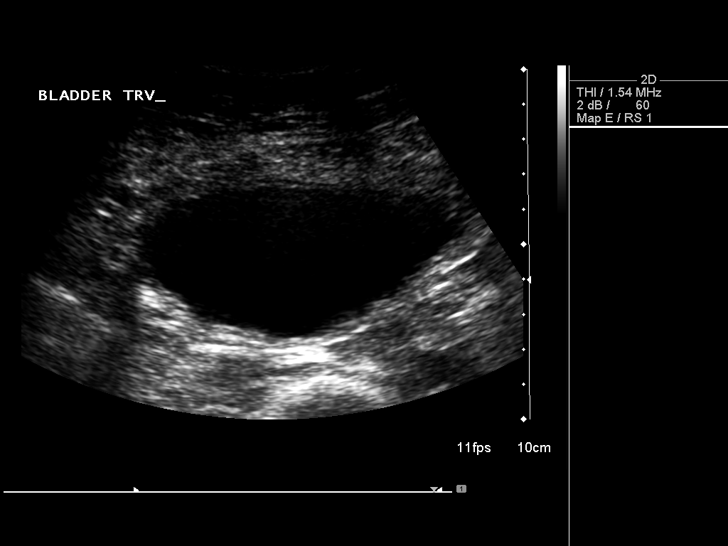
[im 37/37]
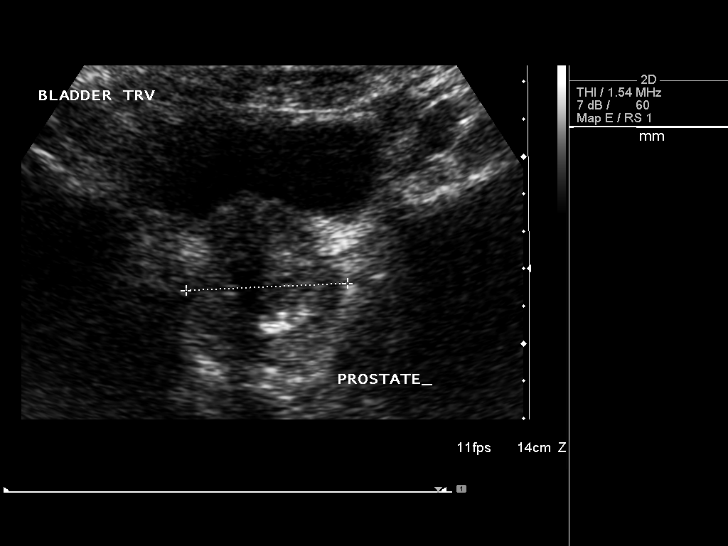

[14 of 25 positions shown; findings below may reference images not displayed]

FINDINGS: Right Kidney:

Length: 10.6 cm in length. The cortex is mildly echogenic. No mass
or hydronephrosis.

Left Kidney:

Length: 10.9 cm in length. Mildly increased cortical echogenicity.
No hydronephrosis or mass

Bladder:

Appears normal for degree of bladder distention.
IMPRESSION: Increased cortical echogenicity compatible with chronic medical
renal parenchymal disease. No hydronephrosis or mass.

## 2014-08-12 ENCOUNTER — Ambulatory Visit (INDEPENDENT_AMBULATORY_CARE_PROVIDER_SITE_OTHER): Payer: Medicare PPO | Admitting: *Deleted

## 2014-08-12 DIAGNOSIS — I442 Atrioventricular block, complete: Secondary | ICD-10-CM

## 2014-08-12 NOTE — Progress Notes (Signed)
Remote pacemaker transmission.   

## 2014-08-13 LAB — MDC_IDC_ENUM_SESS_TYPE_REMOTE
Battery Remaining Percentage: 67 %
Battery Voltage: 2.93 V
Brady Statistic AP VP Percent: 1 %
Brady Statistic AP VS Percent: 1 %
Brady Statistic AS VP Percent: 98 %
Brady Statistic AS VS Percent: 1 %
Brady Statistic RA Percent Paced: 1 %
Brady Statistic RV Percent Paced: 99 %
Date Time Interrogation Session: 20160329075724
Implantable Pulse Generator Model: 2210
Lead Channel Pacing Threshold Pulse Width: 0.4 ms
Lead Channel Sensing Intrinsic Amplitude: 1.8 mV
Lead Channel Setting Pacing Pulse Width: 0.4 ms
MDC IDC MSMT BATTERY REMAINING LONGEVITY: 85 mo
MDC IDC MSMT LEADCHNL RA IMPEDANCE VALUE: 450 Ohm
MDC IDC MSMT LEADCHNL RV IMPEDANCE VALUE: 410 Ohm
MDC IDC MSMT LEADCHNL RV PACING THRESHOLD AMPLITUDE: 1 V
MDC IDC PG SERIAL: 7192434
MDC IDC SET LEADCHNL RA PACING AMPLITUDE: 2 V
MDC IDC SET LEADCHNL RV PACING AMPLITUDE: 1.25 V
MDC IDC SET LEADCHNL RV SENSING SENSITIVITY: 3 mV

## 2014-08-20 ENCOUNTER — Encounter: Payer: Self-pay | Admitting: Cardiology

## 2014-08-26 ENCOUNTER — Encounter: Payer: Self-pay | Admitting: Internal Medicine

## 2014-10-03 ENCOUNTER — Encounter: Payer: Self-pay | Admitting: Gastroenterology

## 2014-11-09 ENCOUNTER — Other Ambulatory Visit: Payer: Self-pay | Admitting: Internal Medicine

## 2014-11-11 ENCOUNTER — Ambulatory Visit (INDEPENDENT_AMBULATORY_CARE_PROVIDER_SITE_OTHER): Payer: Medicare PPO | Admitting: *Deleted

## 2014-11-11 ENCOUNTER — Encounter: Payer: Self-pay | Admitting: Internal Medicine

## 2014-11-11 DIAGNOSIS — I442 Atrioventricular block, complete: Secondary | ICD-10-CM

## 2014-11-11 NOTE — Progress Notes (Signed)
Remote pacemaker transmission.   

## 2014-11-21 LAB — CUP PACEART REMOTE DEVICE CHECK
Battery Remaining Longevity: 102 mo
Brady Statistic AP VS Percent: 1 %
Brady Statistic AS VP Percent: 98 %
Brady Statistic AS VS Percent: 1 %
Lead Channel Impedance Value: 410 Ohm
Lead Channel Pacing Threshold Amplitude: 1.125 V
Lead Channel Pacing Threshold Pulse Width: 0.4 ms
Lead Channel Sensing Intrinsic Amplitude: 5.9 mV
Lead Channel Setting Pacing Amplitude: 1.375
Lead Channel Setting Pacing Amplitude: 2 V
Lead Channel Setting Pacing Pulse Width: 0.4 ms
Lead Channel Setting Sensing Sensitivity: 3 mV
MDC IDC MSMT BATTERY REMAINING PERCENTAGE: 81 %
MDC IDC MSMT BATTERY VOLTAGE: 2.93 V
MDC IDC MSMT LEADCHNL RA IMPEDANCE VALUE: 460 Ohm
MDC IDC MSMT LEADCHNL RA PACING THRESHOLD AMPLITUDE: 0.625 V
MDC IDC MSMT LEADCHNL RA PACING THRESHOLD PULSEWIDTH: 0.4 ms
MDC IDC MSMT LEADCHNL RA SENSING INTR AMPL: 2.2 mV
MDC IDC SESS DTM: 20160628062912
MDC IDC STAT BRADY AP VP PERCENT: 1.1 %
MDC IDC STAT BRADY RA PERCENT PACED: 1 %
MDC IDC STAT BRADY RV PERCENT PACED: 99 %
Pulse Gen Serial Number: 7192434

## 2014-12-05 ENCOUNTER — Other Ambulatory Visit: Payer: Self-pay | Admitting: Internal Medicine

## 2014-12-10 ENCOUNTER — Encounter: Payer: Self-pay | Admitting: *Deleted

## 2014-12-10 ENCOUNTER — Other Ambulatory Visit: Payer: Self-pay

## 2014-12-10 ENCOUNTER — Other Ambulatory Visit: Payer: Self-pay | Admitting: Internal Medicine

## 2014-12-10 DIAGNOSIS — E1021 Type 1 diabetes mellitus with diabetic nephropathy: Secondary | ICD-10-CM

## 2014-12-10 DIAGNOSIS — E1121 Type 2 diabetes mellitus with diabetic nephropathy: Secondary | ICD-10-CM

## 2014-12-10 MED ORDER — INSULIN GLARGINE 100 UNIT/ML ~~LOC~~ SOLN: SUBCUTANEOUS | Status: DC

## 2014-12-10 MED ORDER — INSULIN SYRINGE 31G X 5/16" 0.5 ML MISC
Status: DC
Start: 2014-12-10 — End: 2015-12-21

## 2014-12-30 ENCOUNTER — Other Ambulatory Visit: Payer: Self-pay | Admitting: Internal Medicine

## 2014-12-30 DIAGNOSIS — R0989 Other specified symptoms and signs involving the circulatory and respiratory systems: Secondary | ICD-10-CM

## 2014-12-30 DIAGNOSIS — E11329 Type 2 diabetes mellitus with mild nonproliferative diabetic retinopathy without macular edema: Secondary | ICD-10-CM | POA: Diagnosis not present

## 2014-12-30 LAB — HM DIABETES EYE EXAM

## 2014-12-31 ENCOUNTER — Encounter: Payer: Self-pay | Admitting: Internal Medicine

## 2014-12-31 ENCOUNTER — Other Ambulatory Visit (INDEPENDENT_AMBULATORY_CARE_PROVIDER_SITE_OTHER): Payer: Medicare PPO

## 2014-12-31 ENCOUNTER — Ambulatory Visit (INDEPENDENT_AMBULATORY_CARE_PROVIDER_SITE_OTHER): Payer: Medicare PPO | Admitting: Internal Medicine

## 2014-12-31 VITALS — BP 130/84 | HR 82 | Temp 97.8°F | Resp 16 | Ht 69.0 in | Wt 279.0 lb

## 2014-12-31 DIAGNOSIS — E1121 Type 2 diabetes mellitus with diabetic nephropathy: Secondary | ICD-10-CM | POA: Diagnosis not present

## 2014-12-31 DIAGNOSIS — E785 Hyperlipidemia, unspecified: Secondary | ICD-10-CM | POA: Diagnosis not present

## 2014-12-31 DIAGNOSIS — I10 Essential (primary) hypertension: Secondary | ICD-10-CM

## 2014-12-31 DIAGNOSIS — N183 Chronic kidney disease, stage 3 unspecified: Secondary | ICD-10-CM

## 2014-12-31 DIAGNOSIS — E0821 Diabetes mellitus due to underlying condition with diabetic nephropathy: Secondary | ICD-10-CM | POA: Diagnosis not present

## 2014-12-31 DIAGNOSIS — E1021 Type 1 diabetes mellitus with diabetic nephropathy: Secondary | ICD-10-CM | POA: Diagnosis not present

## 2014-12-31 LAB — URINALYSIS, ROUTINE W REFLEX MICROSCOPIC
Bilirubin Urine: NEGATIVE
Ketones, ur: NEGATIVE
Leukocytes, UA: NEGATIVE
Nitrite: NEGATIVE
PH: 5.5 (ref 5.0–8.0)
Total Protein, Urine: >=300 — AB
Urine Glucose: NEGATIVE
Urobilinogen, UA: 0.2 (ref 0.0–1.0)

## 2014-12-31 LAB — CBC WITH DIFFERENTIAL/PLATELET
Basophils Absolute: 0 10*3/uL (ref 0.0–0.1)
Basophils Relative: 0.4 % (ref 0.0–3.0)
Eosinophils Absolute: 0.2 10*3/uL (ref 0.0–0.7)
Eosinophils Relative: 3.7 % (ref 0.0–5.0)
HCT: 47.7 % (ref 39.0–52.0)
Hemoglobin: 16.2 g/dL (ref 13.0–17.0)
LYMPHS ABS: 1.4 10*3/uL (ref 0.7–4.0)
Lymphocytes Relative: 21.4 % (ref 12.0–46.0)
MCHC: 33.9 g/dL (ref 30.0–36.0)
MCV: 92.2 fl (ref 78.0–100.0)
MONO ABS: 0.7 10*3/uL (ref 0.1–1.0)
MONOS PCT: 11.3 % (ref 3.0–12.0)
NEUTROS PCT: 63.2 % (ref 43.0–77.0)
Neutro Abs: 4.1 10*3/uL (ref 1.4–7.7)
Platelets: 191 10*3/uL (ref 150.0–400.0)
RBC: 5.18 Mil/uL (ref 4.22–5.81)
RDW: 14.6 % (ref 11.5–15.5)
WBC: 6.5 10*3/uL (ref 4.0–10.5)

## 2014-12-31 LAB — IBC PANEL
IRON: 90 ug/dL (ref 42–165)
Saturation Ratios: 34.7 % (ref 20.0–50.0)
Transferrin: 185 mg/dL — ABNORMAL LOW (ref 212.0–360.0)

## 2014-12-31 LAB — COMPREHENSIVE METABOLIC PANEL
ALBUMIN: 3.9 g/dL (ref 3.5–5.2)
ALT: 19 U/L (ref 0–53)
AST: 27 U/L (ref 0–37)
Alkaline Phosphatase: 98 U/L (ref 39–117)
BUN: 27 mg/dL — ABNORMAL HIGH (ref 6–23)
CALCIUM: 9.4 mg/dL (ref 8.4–10.5)
CO2: 28 mEq/L (ref 19–32)
CREATININE: 1.65 mg/dL — AB (ref 0.40–1.50)
Chloride: 104 mEq/L (ref 96–112)
GFR: 52.15 mL/min — ABNORMAL LOW (ref >60.00)
Glucose, Bld: 139 mg/dL — ABNORMAL HIGH (ref 70–99)
Potassium: 4.3 mEq/L (ref 3.5–5.1)
Sodium: 140 mEq/L (ref 135–145)
Total Bilirubin: 0.8 mg/dL (ref 0.2–1.2)
Total Protein: 7.1 g/dL (ref 6.0–8.3)

## 2014-12-31 LAB — TSH: TSH: 1.68 u[IU]/mL (ref 0.35–4.50)

## 2014-12-31 LAB — MICROALBUMIN / CREATININE URINE RATIO
CREATININE, U: 172.8 mg/dL
MICROALB UR: 192.3 mg/dL — AB (ref 0.0–1.9)
MICROALB/CREAT RATIO: 111.3 mg/g — AB (ref 0.0–30.0)

## 2014-12-31 LAB — FERRITIN: Ferritin: 279.5 ng/mL (ref 22.0–322.0)

## 2014-12-31 LAB — PHOSPHORUS: PHOSPHORUS: 3.6 mg/dL (ref 2.3–4.6)

## 2014-12-31 LAB — HEMOGLOBIN A1C: HEMOGLOBIN A1C: 7.3 % — AB (ref 4.6–6.5)

## 2014-12-31 MED ORDER — INSULIN GLARGINE 100 UNIT/ML ~~LOC~~ SOLN: SUBCUTANEOUS | Status: DC

## 2014-12-31 MED ORDER — AMOXICILLIN 500 MG PO CAPS: 500.0000 mg | ORAL_CAPSULE | Freq: Three times a day (TID) | ORAL | Status: DC

## 2014-12-31 MED ORDER — ONETOUCH LANCETS MISC: 1.0000 | Freq: Three times a day (TID) | Status: DC

## 2014-12-31 NOTE — Progress Notes (Signed)
Subjective:  Patient ID: Connor Dixon, male    DOB: 23-Mar-1935  Age: 80 y.o. MRN: [id number]  CC: Diabetes   HPI Connor Dixon presents for follow-up on diabetes. He recently saw his eye doctor and was found to have worth of worsening diabetic retinopathy. His eye doctor has asked that we do a scan of his carotid arteries to see if there is concern for atherosclerosis. Patient complains of chronic, unchanged shortness of breath. He also brings with him a list of lab requests that has been ordered by his nephrologist.  Outpatient Prescriptions Prior to Visit  Medication Sig Dispense Refill  . allopurinol (ZYLOPRIM) 100 MG tablet Take 1 tablet (100 mg total) by mouth daily. 30 tablet 11  . aspirin EC 81 MG tablet Take 81 mg by mouth daily.    . B-D ULTRAFINE III SHORT PEN 31G X 8 MM MISC AS DIRECTED WITH INSULIN PENS 100 each 3  . Blood Glucose Monitoring Suppl (FREESTYLE LITE) DEVI by Does not apply route 5 (five) times daily.    . Blood Glucose Monitoring Suppl (ONETOUCH ULTRALINK) W/DEVICE KIT by Does not apply route.    . carvedilol (COREG) 6.25 MG tablet TAKE 1 TABLET TWICE DAILY 60 tablet 9  . furosemide (LASIX) 40 MG tablet TAKE 1 TABLET BY MOUTH EVERY MORNING AND 1/2 TABLET BY MOUTH AT 1 PM 135 tablet 1  . glucose blood (ONETOUCH VERIO) test strip Test five times daily Dx:E11.21 (Patient taking differently: Test five times daily Dx:E11.21 ONE TOUCH - Verio) 500 each 3  . insulin NPH-regular Human (HUMULIN 70/30) (70-30) 100 UNIT/ML injection INJECT 65 UNITS AS DIRECTED 60 mL 5  . Insulin Syringe-Needle U-100 (INSULIN SYRINGE .5CC/31GX5/16") 31G X 5/16" 0.5 ML MISC USE FOUR TIMES DAILY AS DIRECTED 120 each 3  . loratadine (CLARITIN) 10 MG tablet Take 10 mg by mouth daily as needed for allergies.     Marland Kitchen losartan (COZAAR) 50 MG tablet Take 0.5 tablets (25 mg total) by mouth daily. 45 tablet 11  . pravastatin (PRAVACHOL) 40 MG tablet TAKE 1 TABLET BY MOUTH EVERY DAY 90 tablet 3  .  amoxicillin (AMOXIL) 500 MG capsule Take ONLY when having dental procedures    . insulin glargine (LANTUS) 100 UNIT/ML injection INJECT 25 UNITS UNDER THE SKIN EACH NIGHT AS DIRECTED BY YOUR PHYSICIAN 10 mL 3  . ONE TOUCH LANCETS MISC by Does not apply route.    . Lancets (FREESTYLE) lancets testing 5 times a day  Dx 250.01 500 each 3   No facility-administered medications prior to visit.    ROS Review of Systems  Constitutional: Negative for fever, chills, diaphoresis, activity change, appetite change, fatigue and unexpected weight change.  HENT: Negative.   Eyes: Negative.   Respiratory: Positive for shortness of breath. Negative for apnea, cough, choking, chest tightness, wheezing and stridor.   Cardiovascular: Negative.  Negative for chest pain, palpitations and leg swelling.  Gastrointestinal: Negative.  Negative for nausea, vomiting, abdominal pain, diarrhea, constipation and blood in stool.  Endocrine: Negative.  Negative for polydipsia, polyphagia and polyuria.  Genitourinary: Negative.   Musculoskeletal: Positive for arthralgias. Negative for myalgias, back pain and neck pain.  Skin: Negative.  Negative for rash.  Allergic/Immunologic: Negative.   Neurological: Negative.  Negative for dizziness, tremors, weakness, light-headedness and numbness.  Hematological: Negative.   Psychiatric/Behavioral: Negative.     Objective:  BP 130/84 mmHg  Pulse 82  Temp(Src) 97.8 F (36.6 C) (Oral)  Ht _0  (1.753  m)  Wt 279 lb (126.554 kg)  BMI 41.18 kg/m2  SpO2 94%  BP Readings from Last 3 Encounters:  12/31/14 130/84  07/02/14 138/82  05/13/14 142/82    Wt Readings from Last 3 Encounters:  12/31/14 279 lb (126.554 kg)  07/02/14 270 lb (122.471 kg)  05/13/14 267 lb (121.11 kg)    Physical Exam  Constitutional: He is oriented to person, place, and time. He appears well-developed and well-nourished. No distress.  HENT:  Mouth/Throat: Oropharynx is clear and moist. No  oropharyngeal exudate.  Eyes: Conjunctivae are normal. Right eye exhibits no discharge. Left eye exhibits no discharge. No scleral icterus.  Neck: Normal range of motion. Neck supple. No JVD present. Carotid bruit is present. No tracheal deviation present. No thyroid mass and no thyromegaly present.  Cardiovascular: Normal rate, regular rhythm, normal heart sounds and intact distal pulses.  Exam reveals no gallop and no friction rub.   No murmur heard. Pulmonary/Chest: Effort normal and breath sounds normal. No stridor. No respiratory distress. He has no wheezes. He has no rales. He exhibits no tenderness.  Abdominal: Soft. Bowel sounds are normal. He exhibits no distension and no mass. There is no tenderness. There is no rebound and no guarding.  Musculoskeletal: Normal range of motion. He exhibits no edema or tenderness.  Lymphadenopathy:    He has no cervical adenopathy.  Neurological: He is oriented to person, place, and time.  Skin: Skin is warm and dry. No rash noted. He is not diaphoretic. No erythema. No pallor.  Psychiatric: He has a normal mood and affect. His behavior is normal. Judgment and thought content normal.  Vitals reviewed.   Lab Results  Component Value Date   WBC 6.5 12/31/2014   HGB 16.2 12/31/2014   HCT 47.7 12/31/2014   PLT 191.0 12/31/2014   GLUCOSE 139* 12/31/2014   CHOL 158 07/02/2014   TRIG 93.0 07/02/2014   HDL 46.00 07/02/2014   LDLCALC 93 07/02/2014   ALT 19 12/31/2014   AST 27 12/31/2014   NA 140 12/31/2014   K 4.3 12/31/2014   CL 104 12/31/2014   CREATININE 1.65* 12/31/2014   BUN 27* 12/31/2014   CO2 28 12/31/2014   TSH 1.68 12/31/2014   PSA 1.77 01/06/2010   HGBA1C 7.3* 12/31/2014   MICROALBUR 192.3* 12/31/2014    US Renal  05/02/2013   CLINICAL DATA:  Chronic kidney disease  EXAM: RENAL/URINARY TRACT ULTRASOUND COMPLETE  COMPARISON:  None.  FINDINGS: Right Kidney:  Length: 10.6 cm in length. The cortex is mildly echogenic. No mass or  hydronephrosis.  Left Kidney:  Length: 10.9 cm in length. Mildly increased cortical echogenicity. No hydronephrosis or mass  Bladder:  Appears normal for degree of bladder distention.  IMPRESSION: Increased cortical echogenicity compatible with chronic medical renal parenchymal disease. No hydronephrosis or mass.   Electronically Signed   By: Maryclare Bean M.D.   On: 05/02/2013 13:03    Assessment & Plan:   Andrei was seen today for diabetes.  Diagnoses and all orders for this visit:  Essential hypertension- his blood pressure is well-controlled, electrolytes and renal function are stable. -     Cancel: Basic metabolic panel; Future -     CBC with Differential/Platelet; Future  Diabetes mellitus due to underlying condition with diabetic nephropathy- his blood sugars are adequately well controlled. I have ordered an ultrasound of his carotids to screen for atherosclerosis. -     Cancel: Basic metabolic panel; Future -     Hemoglobin  A1c; Future -     Phosphorus; Future -     Urinalysis, Routine w reflex microscopic (not at [facility]); Future -     Ferritin; Future -     IBC panel; Future -     PTH, intact and calcium; Future  Hyperlipidemia with target LDL less than 70- he has achieved his LDL goal and is doing well on the statin. -     TSH; Future  Type 1 diabetes mellitus with nephropathy -     ONE TOUCH LANCETS MISC; 1 Act by Does not apply route 3 (three) times daily. -     insulin glargine (LANTUS) 100 UNIT/ML injection; INJECT 25 UNITS UNDER THE SKIN EACH NIGHT AS DIRECTED BY YOUR PHYSICIAN  Type 2 diabetes mellitus with diabetic nephropathy -     ONE TOUCH LANCETS MISC; 1 Act by Does not apply route 3 (three) times daily. -     insulin glargine (LANTUS) 100 UNIT/ML injection; INJECT 25 UNITS UNDER THE SKIN EACH NIGHT AS DIRECTED BY YOUR PHYSICIAN  Chronic kidney disease, stage III (moderate)- labs ordered as requested, he will continue to follow-up with his nephrologist. -      Comprehensive metabolic panel; Future -     Microalbumin / creatinine urine ratio; Future  Other orders -     amoxicillin (AMOXIL) 500 MG capsule; Take 1 capsule (500 mg total) by mouth 3 (three) times daily. Take ONLY when having dental procedures  I have discontinued Mr. Strahm's freestyle. I have also changed his ONE TOUCH LANCETS and amoxicillin. Additionally, I am having him maintain his aspirin EC, loratadine, FREESTYLE LITE, glucose blood, losartan, pravastatin, ONETOUCH ULTRALINK, allopurinol, insulin NPH-regular Human, carvedilol, furosemide, B-D ULTRAFINE III SHORT PEN, INSULIN SYRINGE .5CC/31GX5/16", and insulin glargine.  Meds ordered this encounter  Medications  . ONE TOUCH LANCETS MISC    Sig: 1 Act by Does not apply route 3 (three) times daily.    Dispense:  200 each    Refill:  11  . insulin glargine (LANTUS) 100 UNIT/ML injection    Sig: INJECT 25 UNITS UNDER THE SKIN EACH NIGHT AS DIRECTED BY YOUR PHYSICIAN    Dispense:  10 mL    Refill:  3  . amoxicillin (AMOXIL) 500 MG capsule    Sig: Take 1 capsule (500 mg total) by mouth 3 (three) times daily. Take ONLY when having dental procedures    Dispense:  30 capsule    Refill:  11     Follow-up: Return in about 4 months (around 05/02/2015).  Scarlette Calico, MD

## 2014-12-31 NOTE — Progress Notes (Signed)
Pre visit review using our clinic review tool, if applicable. No additional management support is needed unless otherwise documented below in the visit note. 

## 2014-12-31 NOTE — Patient Instructions (Signed)

## 2015-01-01 ENCOUNTER — Encounter: Payer: Self-pay | Admitting: Internal Medicine

## 2015-01-01 ENCOUNTER — Telehealth: Payer: Self-pay | Admitting: Internal Medicine

## 2015-01-01 DIAGNOSIS — E0821 Diabetes mellitus due to underlying condition with diabetic nephropathy: Secondary | ICD-10-CM

## 2015-01-01 LAB — PTH, INTACT AND CALCIUM
CALCIUM: 9.5 mg/dL (ref 8.4–10.5)
PTH: 159 pg/mL — AB (ref 14–64)

## 2015-01-01 MED ORDER — INSULIN GLARGINE 100 UNIT/ML SOLOSTAR PEN: 25.0000 [IU] | PEN_INJECTOR | Freq: Every day | SUBCUTANEOUS | Status: DC

## 2015-01-01 NOTE — Telephone Encounter (Signed)
done

## 2015-01-01 NOTE — Telephone Encounter (Signed)
[facility] called regarding insulin glargine (LANTUS) 100 UNIT/ML injection [[id number]]  They state that pt would rather have the pen Please advise pharmacy

## 2015-01-01 NOTE — Telephone Encounter (Signed)
Please advise 

## 2015-01-02 ENCOUNTER — Encounter: Payer: Self-pay | Admitting: Internal Medicine

## 2015-01-23 ENCOUNTER — Ambulatory Visit (HOSPITAL_COMMUNITY)
Admission: RE | Admit: 2015-01-23 | Discharge: 2015-01-23 | Disposition: A | Discharge status: Home / Self Care | Payer: Medicare PPO | Source: Ambulatory Visit | Attending: Internal Medicine | Admitting: Internal Medicine

## 2015-01-23 DIAGNOSIS — E785 Hyperlipidemia, unspecified: Secondary | ICD-10-CM | POA: Insufficient documentation

## 2015-01-23 DIAGNOSIS — E119 Type 2 diabetes mellitus without complications: Secondary | ICD-10-CM | POA: Diagnosis not present

## 2015-01-23 DIAGNOSIS — R0989 Other specified symptoms and signs involving the circulatory and respiratory systems: Secondary | ICD-10-CM | POA: Insufficient documentation

## 2015-01-23 DIAGNOSIS — I10 Essential (primary) hypertension: Secondary | ICD-10-CM | POA: Insufficient documentation

## 2015-01-23 DIAGNOSIS — E109 Type 1 diabetes mellitus without complications: Secondary | ICD-10-CM | POA: Insufficient documentation

## 2015-01-25 ENCOUNTER — Encounter: Payer: Self-pay | Admitting: Internal Medicine

## 2015-01-25 ENCOUNTER — Other Ambulatory Visit: Payer: Self-pay | Admitting: Internal Medicine

## 2015-01-30 ENCOUNTER — Encounter: Payer: Self-pay | Admitting: Internal Medicine

## 2015-02-02 ENCOUNTER — Other Ambulatory Visit: Payer: Self-pay

## 2015-02-02 DIAGNOSIS — E0821 Diabetes mellitus due to underlying condition with diabetic nephropathy: Secondary | ICD-10-CM

## 2015-02-02 MED ORDER — INSULIN GLARGINE 100 UNIT/ML SOLOSTAR PEN: 25.0000 [IU] | PEN_INJECTOR | Freq: Every day | SUBCUTANEOUS | Status: DC

## 2015-02-05 ENCOUNTER — Telehealth: Payer: Self-pay | Admitting: Internal Medicine

## 2015-02-05 NOTE — Telephone Encounter (Signed)
Efraim Kaufmann is going to call the patient and schedule at his convenience

## 2015-02-05 NOTE — Telephone Encounter (Signed)
New Message   Pt wants to be seen with Dr. Ladona Ridgel before the Recall date, i told pt that we can schedule   After that time. He states he will not pay out of pocket and he wants to come in November.  Please call pt and inform him if he can hold it off until after the holidays

## 2015-02-16 ENCOUNTER — Ambulatory Visit (INDEPENDENT_AMBULATORY_CARE_PROVIDER_SITE_OTHER): Payer: Medicare PPO | Admitting: *Deleted

## 2015-02-16 DIAGNOSIS — I442 Atrioventricular block, complete: Secondary | ICD-10-CM | POA: Diagnosis not present

## 2015-02-16 NOTE — Progress Notes (Signed)
Remote pacemaker transmission.   

## 2015-02-17 LAB — CUP PACEART REMOTE DEVICE CHECK
Battery Remaining Longevity: 103 mo
Battery Remaining Percentage: 81 %
Battery Voltage: 2.93 V
Brady Statistic AP VP Percent: 1.1 %
Brady Statistic AP VS Percent: 1 %
Brady Statistic AS VS Percent: 1 %
Brady Statistic RA Percent Paced: 1 %
Brady Statistic RV Percent Paced: 99 %
Date Time Interrogation Session: 20161003110307
Lead Channel Impedance Value: 450 Ohm
Lead Channel Pacing Threshold Amplitude: 0.625 V
Lead Channel Pacing Threshold Amplitude: 1 V
Lead Channel Pacing Threshold Pulse Width: 0.4 ms
Lead Channel Pacing Threshold Pulse Width: 0.4 ms
Lead Channel Sensing Intrinsic Amplitude: 1.7 mV
Lead Channel Sensing Intrinsic Amplitude: 11.9 mV
Lead Channel Setting Pacing Amplitude: 2 V
Lead Channel Setting Pacing Pulse Width: 0.4 ms
Lead Channel Setting Sensing Sensitivity: 3 mV
MDC IDC MSMT LEADCHNL RV IMPEDANCE VALUE: 430 Ohm
MDC IDC SET LEADCHNL RV PACING AMPLITUDE: 1.25 V
MDC IDC STAT BRADY AS VP PERCENT: 98 %
Pulse Gen Serial Number: 7192434

## 2015-02-20 ENCOUNTER — Ambulatory Visit (INDEPENDENT_AMBULATORY_CARE_PROVIDER_SITE_OTHER): Payer: Medicare PPO

## 2015-02-20 DIAGNOSIS — Z23 Encounter for immunization: Secondary | ICD-10-CM

## 2015-02-25 ENCOUNTER — Encounter: Payer: Self-pay | Admitting: Cardiology

## 2015-03-01 ENCOUNTER — Other Ambulatory Visit: Payer: Self-pay | Admitting: Internal Medicine

## 2015-03-10 ENCOUNTER — Encounter: Payer: Self-pay | Admitting: Internal Medicine

## 2015-03-17 ENCOUNTER — Ambulatory Visit (INDEPENDENT_AMBULATORY_CARE_PROVIDER_SITE_OTHER): Payer: Medicare PPO | Admitting: Internal Medicine

## 2015-03-17 ENCOUNTER — Encounter: Payer: Self-pay | Admitting: Internal Medicine

## 2015-03-17 VITALS — BP 124/76 | HR 73 | Temp 97.3°F | Resp 16 | Ht 69.0 in | Wt 281.0 lb

## 2015-03-17 DIAGNOSIS — M1A3790 Chronic gout due to renal impairment, unspecified ankle and foot, without tophus (tophi): Secondary | ICD-10-CM | POA: Diagnosis not present

## 2015-03-17 DIAGNOSIS — E1121 Type 2 diabetes mellitus with diabetic nephropathy: Secondary | ICD-10-CM

## 2015-03-17 DIAGNOSIS — E0821 Diabetes mellitus due to underlying condition with diabetic nephropathy: Secondary | ICD-10-CM | POA: Diagnosis not present

## 2015-03-17 DIAGNOSIS — Z794 Long term (current) use of insulin: Secondary | ICD-10-CM

## 2015-03-17 DIAGNOSIS — N183 Chronic kidney disease, stage 3 (moderate): Secondary | ICD-10-CM | POA: Diagnosis not present

## 2015-03-17 MED ORDER — ALLOPURINOL 100 MG PO TABS: 100.0000 mg | ORAL_TABLET | Freq: Every day | ORAL | Status: DC

## 2015-03-17 MED ORDER — INSULIN NPH ISOPHANE & REGULAR (70-30) 100 UNIT/ML ~~LOC~~ SUSP: SUBCUTANEOUS | Status: DC

## 2015-03-17 MED ORDER — INSULIN GLARGINE 100 UNIT/ML SOLOSTAR PEN: 20.0000 [IU] | PEN_INJECTOR | Freq: Every day | SUBCUTANEOUS | Status: DC

## 2015-03-17 NOTE — Patient Instructions (Signed)

## 2015-03-17 NOTE — Progress Notes (Signed)
 Subjective:  Patient ID: Connor Dixon, male    DOB: 03/08/1935  Age: 80 y.o. MRN: [id number]  CC: Diabetes   HPI Connor Dixon presents for concerns about a recent episode of hypoglycemia. About a week ago he and his wife were in [place] and he awakened during the night not feeling well and was found to have a blood sugar of 42. EMS was called and he was taken to a local hospital where he was treated. He is concerned that this may happen again and asked my advice about what to do. His blood sugars vary wildly based on his activity level and caloric intake.  Outpatient Prescriptions Prior to Visit  Medication Sig Dispense Refill  . amoxicillin (AMOXIL) 500 MG capsule Take 1 capsule (500 mg total) by mouth 3 (three) times daily. Take ONLY when having dental procedures 30 capsule 11  . aspirin EC 81 MG tablet Take 81 mg by mouth daily.    . B-D ULTRAFINE III SHORT PEN 31G X 8 MM MISC AS DIRECTED WITH INSULIN PENS 100 each 3  . Blood Glucose Monitoring Suppl (FREESTYLE LITE) DEVI by Does not apply route 5 (five) times daily.    . Blood Glucose Monitoring Suppl (ONETOUCH ULTRALINK) W/DEVICE KIT by Does not apply route.    . carvedilol (COREG) 6.25 MG tablet TAKE 1 TABLET TWICE DAILY 60 tablet 9  . furosemide (LASIX) 40 MG tablet TAKE 1 TABLET BY MOUTH EVERY MORNING AND 1/2 TABLET BY MOUTH AT 1 PM 135 tablet 1  . Insulin Syringe-Needle U-100 (INSULIN SYRINGE .5CC/31GX5/16") 31G X 5/16" 0.5 ML MISC USE FOUR TIMES DAILY AS DIRECTED 120 each 3  . loratadine (CLARITIN) 10 MG tablet Take 10 mg by mouth daily as needed for allergies.     . losartan (COZAAR) 50 MG tablet Take 0.5 tablets (25 mg total) by mouth daily. 45 tablet 11  . ONE TOUCH LANCETS MISC 1 Act by Does not apply route 3 (three) times daily. 200 each 11  . ONETOUCH VERIO test strip TEST 5 TIMES DAILY 500 each 3  . pravastatin (PRAVACHOL) 40 MG tablet TAKE 1 TABLET BY MOUTH EVERY DAY 90 tablet 3  . allopurinol (ZYLOPRIM) 100 MG tablet  Take 1 tablet (100 mg total) by mouth daily. 30 tablet 11  . Insulin Glargine (LANTUS SOLOSTAR) 100 UNIT/ML Solostar Pen Inject 25 Units into the skin daily at 10 pm. 21 mL 3  . insulin NPH-regular Human (HUMULIN 70/30) (70-30) 100 UNIT/ML injection INJECT 65 UNITS AS DIRECTED 60 mL 5   No facility-administered medications prior to visit.    ROS Review of Systems  Constitutional: Negative.  Negative for fever, chills and fatigue.  HENT: Negative.   Eyes: Negative.   Respiratory: Negative.  Negative for cough, choking, chest tightness, shortness of breath and stridor.   Cardiovascular: Negative.  Negative for chest pain, palpitations and leg swelling.  Gastrointestinal: Negative.  Negative for vomiting, abdominal pain, constipation and blood in stool.  Endocrine: Negative.  Negative for polydipsia, polyphagia and polyuria.  Genitourinary: Negative.   Musculoskeletal: Negative.  Negative for myalgias, back pain and arthralgias.  Skin: Negative.  Negative for rash.  Allergic/Immunologic: Negative.   Neurological: Negative.  Negative for dizziness, weakness and headaches.  Hematological: Negative.   Psychiatric/Behavioral: Negative.     Objective:  BP 124/76 mmHg  Pulse 73  Temp(Src) 97.3 F (36.3 C) (Oral)  Resp 16  Ht 5' 9" (1.753 m)  Wt 281 lb (127.461 kg)    BMI 41.48 kg/m2  SpO2 97%  BP Readings from Last 3 Encounters:  03/17/15 124/76  12/31/14 130/84  07/02/14 138/82    Wt Readings from Last 3 Encounters:  03/17/15 281 lb (127.461 kg)  12/31/14 279 lb (126.554 kg)  07/02/14 270 lb (122.471 kg)    Physical Exam  Constitutional: He is oriented to person, place, and time. He appears well-developed and well-nourished. No distress.  HENT:  Mouth/Throat: Oropharynx is clear and moist. No oropharyngeal exudate.  Eyes: Conjunctivae are normal. Right eye exhibits no discharge. Left eye exhibits no discharge. No scleral icterus.  Neck: Normal range of motion. Neck supple.  No JVD present. No tracheal deviation present. No thyromegaly present.  Cardiovascular: Normal rate, regular rhythm, normal heart sounds and intact distal pulses.  Exam reveals no gallop and no friction rub.   No murmur heard. Pulmonary/Chest: Effort normal and breath sounds normal. No stridor. No respiratory distress. He has no wheezes. He has no rales. He exhibits no tenderness.  Abdominal: Soft. Bowel sounds are normal. He exhibits no distension and no mass. There is no tenderness. There is no rebound and no guarding.  Musculoskeletal: Normal range of motion. He exhibits no edema or tenderness.  Lymphadenopathy:    He has no cervical adenopathy.  Neurological: He is oriented to person, place, and time.  Skin: Skin is warm and dry. No rash noted. He is not diaphoretic. No erythema. No pallor.  Psychiatric: He has a normal mood and affect. His behavior is normal. Judgment and thought content normal.  Vitals reviewed.   Lab Results  Component Value Date   WBC 6.5 12/31/2014   HGB 16.2 12/31/2014   HCT 47.7 12/31/2014   PLT 191.0 12/31/2014   GLUCOSE 139* 12/31/2014   CHOL 158 07/02/2014   TRIG 93.0 07/02/2014   HDL 46.00 07/02/2014   LDLCALC 93 07/02/2014   ALT 19 12/31/2014   AST 27 12/31/2014   NA 140 12/31/2014   K 4.3 12/31/2014   CL 104 12/31/2014   CREATININE 1.65* 12/31/2014   BUN 27* 12/31/2014   CO2 28 12/31/2014   TSH 1.68 12/31/2014   PSA 1.77 01/06/2010   HGBA1C 7.3* 12/31/2014   MICROALBUR 192.3* 12/31/2014    No results found.  Assessment & Plan:   Connor Dixon was seen today for diabetes.  Diagnoses and all orders for this visit:  Chronic gout due to renal impairment involving foot without tophus, unspecified laterality -     allopurinol (ZYLOPRIM) 100 MG tablet; Take 1 tablet (100 mg total) by mouth daily.  Diabetes mellitus due to underlying condition with diabetic nephropathy, without long-term current use of insulin (HCC) -     Insulin Glargine (LANTUS  SOLOSTAR) 100 UNIT/ML Solostar Pen; Inject 20 Units into the skin daily at 10 pm. -     insulin NPH-regular Human (HUMULIN 70/30) (70-30) 100 UNIT/ML injection; INJECT 60 UNITS AS DIRECTED -     Amb Referral to Nutrition and Diabetic E  Type 2 diabetes mellitus with diabetic nephropathy, with long-term current use of insulin (HCC)- since he has recently had an episode of nocturnal hypoglycemia I have asked him to decrease his daily Lantus dose from 25 units a day to 20 units a day and I've also asked him to decrease his NPH insulin dose at evening by 5 units. The other NPH insulin doses will stay the same. -     insulin NPH-regular Human (HUMULIN 70/30) (70-30) 100 UNIT/ML injection; INJECT 60 UNITS AS DIRECTED -       Amb Referral to Nutrition and Diabetic E   I have changed Connor Dixon's Insulin Glargine and insulin NPH-regular Human. I am also having him maintain his aspirin EC, loratadine, FREESTYLE LITE, losartan, pravastatin, ONETOUCH ULTRALINK, carvedilol, furosemide, B-D ULTRAFINE III SHORT PEN, INSULIN SYRINGE .5CC/31GX5/16", ONE TOUCH LANCETS, amoxicillin, ONETOUCH VERIO, and allopurinol.  Meds ordered this encounter  Medications  . allopurinol (ZYLOPRIM) 100 MG tablet    Sig: Take 1 tablet (100 mg total) by mouth daily.    Dispense:  90 tablet    Refill:  3  . Insulin Glargine (LANTUS SOLOSTAR) 100 UNIT/ML Solostar Pen    Sig: Inject 20 Units into the skin daily at 10 pm.    Dispense:  21 mL    Refill:  3  . insulin NPH-regular Human (HUMULIN 70/30) (70-30) 100 UNIT/ML injection    Sig: INJECT 60 UNITS AS DIRECTED    Dispense:  60 mL    Refill:  5    PATIENT IS REQUESTING a KWIKPEN     Follow-up: Return in about 3 months (around 06/17/2015).  Thomas Jones, MD  

## 2015-03-17 NOTE — Progress Notes (Signed)
Pre visit review using our clinic review tool, if applicable. No additional management support is needed unless otherwise documented below in the visit note. 

## 2015-03-20 DIAGNOSIS — N183 Chronic kidney disease, stage 3 (moderate): Secondary | ICD-10-CM | POA: Diagnosis not present

## 2015-03-20 DIAGNOSIS — N2581 Secondary hyperparathyroidism of renal origin: Secondary | ICD-10-CM | POA: Diagnosis not present

## 2015-03-20 DIAGNOSIS — D509 Iron deficiency anemia, unspecified: Secondary | ICD-10-CM | POA: Diagnosis not present

## 2015-03-25 DIAGNOSIS — E785 Hyperlipidemia, unspecified: Secondary | ICD-10-CM | POA: Diagnosis not present

## 2015-03-25 DIAGNOSIS — N2581 Secondary hyperparathyroidism of renal origin: Secondary | ICD-10-CM | POA: Diagnosis not present

## 2015-03-25 DIAGNOSIS — N183 Chronic kidney disease, stage 3 (moderate): Secondary | ICD-10-CM | POA: Diagnosis not present

## 2015-03-25 DIAGNOSIS — D509 Iron deficiency anemia, unspecified: Secondary | ICD-10-CM | POA: Diagnosis not present

## 2015-03-25 DIAGNOSIS — E1165 Type 2 diabetes mellitus with hyperglycemia: Secondary | ICD-10-CM | POA: Diagnosis not present

## 2015-03-25 DIAGNOSIS — E1129 Type 2 diabetes mellitus with other diabetic kidney complication: Secondary | ICD-10-CM | POA: Diagnosis not present

## 2015-03-25 DIAGNOSIS — I10 Essential (primary) hypertension: Secondary | ICD-10-CM | POA: Diagnosis not present

## 2015-03-26 LAB — BASIC METABOLIC PANEL
BUN: 33 mg/dL — AB (ref 4–21)
Creatinine: 1.8 mg/dL — AB (ref 0.6–1.3)
Glucose: 219 mg/dL
Potassium: 5 mmol/L (ref 3.4–5.3)
SODIUM: 137 mmol/L (ref 137–147)

## 2015-03-26 LAB — CBC AND DIFFERENTIAL
HEMATOCRIT: 43 % (ref 41–53)
Hemoglobin: 15 g/dL (ref 13.5–17.5)
NEUTROS ABS: 4 /uL
Platelets: 179 10*3/uL (ref 150–399)
WBC: 6.1 10^3/mL

## 2015-03-26 LAB — HEPATIC FUNCTION PANEL
ALK PHOS: 104 U/L (ref 25–125)
ALT: 18 U/L (ref 10–40)
AST: 23 U/L (ref 14–40)
BILIRUBIN, TOTAL: 1.1 mg/dL

## 2015-03-31 ENCOUNTER — Encounter: Payer: Self-pay | Admitting: Internal Medicine

## 2015-03-31 ENCOUNTER — Ambulatory Visit (INDEPENDENT_AMBULATORY_CARE_PROVIDER_SITE_OTHER): Payer: Medicare PPO | Admitting: Internal Medicine

## 2015-03-31 VITALS — BP 150/80 | HR 75 | Ht 69.0 in | Wt 278.0 lb

## 2015-03-31 DIAGNOSIS — Z950 Presence of cardiac pacemaker: Secondary | ICD-10-CM | POA: Diagnosis not present

## 2015-03-31 DIAGNOSIS — Z45018 Encounter for adjustment and management of other part of cardiac pacemaker: Secondary | ICD-10-CM | POA: Diagnosis not present

## 2015-03-31 DIAGNOSIS — I442 Atrioventricular block, complete: Secondary | ICD-10-CM

## 2015-03-31 DIAGNOSIS — I10 Essential (primary) hypertension: Secondary | ICD-10-CM

## 2015-03-31 LAB — CUP PACEART INCLINIC DEVICE CHECK
Battery Remaining Longevity: 93.6
Battery Voltage: 2.92 V
Brady Statistic RA Percent Paced: 0.61 %
Brady Statistic RV Percent Paced: 99.07 %
Date Time Interrogation Session: 20161115140100
Implantable Lead Implant Date: 20111202
Implantable Lead Location: 753859
Implantable Lead Location: 753860
Lead Channel Impedance Value: 425 Ohm
Lead Channel Impedance Value: 462.5 Ohm
Lead Channel Pacing Threshold Amplitude: 0.75 V
Lead Channel Pacing Threshold Pulse Width: 0.4 ms
Lead Channel Pacing Threshold Pulse Width: 0.4 ms
Lead Channel Sensing Intrinsic Amplitude: 1.8 mV
Lead Channel Setting Pacing Amplitude: 1.375
Lead Channel Setting Pacing Amplitude: 2 V
Lead Channel Setting Pacing Pulse Width: 0.4 ms
MDC IDC LEAD IMPLANT DT: 20111202
MDC IDC MSMT LEADCHNL RV PACING THRESHOLD AMPLITUDE: 1.25 V
MDC IDC MSMT LEADCHNL RV SENSING INTR AMPL: 5.4 mV
MDC IDC SET LEADCHNL RV SENSING SENSITIVITY: 3 mV
Pulse Gen Serial Number: 7192434

## 2015-03-31 NOTE — Progress Notes (Signed)
HPI Connor Dixon he is a very pleasant morbidly obese 80 year old man with a history of complete heart block, status post permanent pacemaker insertion. He also has a history of diabetes, hypertension, CRI, and peripheral edema. He remains active, working in his yard. No syncope. No chest pain or shortness of breath.  Allergies  Allergen Reactions  . Oysters [Shellfish Allergy]     Blisters and swelling  . Apple Other (See Comments)    Sore throat     Current Outpatient Prescriptions  Medication Sig Dispense Refill  . allopurinol (ZYLOPRIM) 100 MG tablet Take 1 tablet (100 mg total) by mouth daily. 90 tablet 3  . amoxicillin (AMOXIL) 500 MG capsule Take 1 capsule (500 mg total) by mouth 3 (three) times daily. Take ONLY when having dental procedures 30 capsule 11  . aspirin EC 81 MG tablet Take 81 mg by mouth daily.    . B-D ULTRAFINE III SHORT PEN 31G X 8 MM MISC AS DIRECTED WITH INSULIN PENS 100 each 3  . Blood Glucose Monitoring Suppl (FREESTYLE LITE) DEVI by Does not apply route 5 (five) times daily.    . Blood Glucose Monitoring Suppl (ONETOUCH ULTRALINK) W/DEVICE KIT by Does not apply route.    . carvedilol (COREG) 6.25 MG tablet TAKE 1 TABLET TWICE DAILY 60 tablet 9  . furosemide (LASIX) 40 MG tablet TAKE 1 TABLET BY MOUTH EVERY MORNING AND 1/2 TABLET BY MOUTH AT 1 PM 135 tablet 1  . Insulin Glargine (LANTUS SOLOSTAR) 100 UNIT/ML Solostar Pen Inject 20 Units into the skin daily at 10 pm. 21 mL 3  . insulin NPH-regular Human (HUMULIN 70/30) (70-30) 100 UNIT/ML injection INJECT 60 UNITS AS DIRECTED 60 mL 5  . Insulin Syringe-Needle U-100 (INSULIN SYRINGE .5CC/31GX5/16") 31G X 5/16" 0.5 ML MISC USE FOUR TIMES DAILY AS DIRECTED 120 each 3  . loratadine (CLARITIN) 10 MG tablet Take 10 mg by mouth daily as needed for allergies.     Marland Kitchen losartan (COZAAR) 50 MG tablet Take 0.5 tablets (25 mg total) by mouth daily. 45 tablet 11  . ONE TOUCH LANCETS MISC 1 Act by Does not apply route 3 (three)  times daily. 200 each 11  . ONETOUCH VERIO test strip TEST 5 TIMES DAILY 500 each 3  . pravastatin (PRAVACHOL) 40 MG tablet TAKE 1 TABLET BY MOUTH EVERY DAY 90 tablet 3   No current facility-administered medications for this visit.     Past Medical History  Diagnosis Date  . Atrioventricular block, complete ([place])   . Hypopotassemia   . Other and unspecified angina pectoris     typical  . Arthus phenomenon   . Morbid obesity ([place])   . Essential hypertension, benign 08/06/2007    Very good BP control  . HLD (hyperlipidemia)   . DM (diabetes mellitus) ([place])     adult onset   . Cardiac pacemaker in situ 07/22/2010  . DIABETES MELLITUS, TYPE I, ADULT ONSET 08/06/2007  . History of second degree heart block   . HYPERLIPIDEMIA 08/06/2007  . Obesity   . Chronic kidney disease, stage 3     Presumably from longstanding DM & HTN. Scr has gradually increased over the past 2 years (1.6 in 12/2010, 2 in 03/2011, and up to 2.1 in 01/2013)  . Acute on chronic diastolic congestive heart failure ([place])   . Type I (juvenile type) diabetes mellitus without mention of complication, not stated as uncontrolled     (04/25/13 OV with Dr. Marval Regal) = Poorly controlled &  is working  w/Dr. Crissie Sickles to improve this. Most recent Hgb A1c was 8.2% but Connor Dixon reports it was down to 7.6 last month.    ROS:   All systems reviewed and negative except as noted in the HPI.   Past Surgical History  Procedure Laterality Date  . Total hip arthroplasty  04/05/06    right  . Cataract extraction  8/08    [facility]  . Ptvdp  12/11    PPM - [employer]     Family History  Problem Relation Age of Onset  . Colon cancer Neg Hx     no definate prostate CA   . Asthma Father   . Coronary artery disease Father   . Diabetes Father   . Hyperlipidemia Father   . Coronary artery disease Mother   . Asthma Mother   . Heart attack Mother 28  . Stroke Father 34     Social History   Social History  . Marital  Status: Married    Spouse Name: N/A  . Number of Children: N/A  . Years of Education: N/A   Occupational History  . Not on file.   Social History Main Topics  . Smoking status: Never Smoker   . Smokeless tobacco: Never Used  . Alcohol Use: No  . Drug Use: No  . Sexual Activity: Not Currently   Other Topics Concern  . Not on file   Social History Narrative   [employer] BA, 2 Master's degrees. [place] - for certification Education specialist.    Retired Tree surgeon.   Active - travels.   Married - 1961; 2 sons ([place]) and 5 grandchildren.        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      BP 150/80 mmHg  Pulse 75  Ht _0  (1.753 m)  Wt 278 lb (126.1 kg)  BMI 41.03 kg/m2  SpO2 95%  Physical Exam:  Well appearing obese 80 year old man, NAD HEENT: Unremarkable Neck:  7 cm JVD, no thyromegally Lungs:  Clear with no wheezes, rales, or rhonchi. HEART:  Regular rate rhythm, no murmurs, no rubs, no clicks Abd:  soft, positive bowel sounds, no organomegally, no rebound, no guarding Ext:  2 plus pulses, no edema, no cyanosis, no clubbing Skin:  No rashes no nodules Neuro:  CN II through XII intact, motor grossly intact   DEVICE  Normal device function.  See PaceArt for details.   Assess/Plan:

## 2015-03-31 NOTE — Assessment & Plan Note (Signed)
The patient has been unable to lose weight. I've encouraged him to increase his physical activity, and eat less.

## 2015-03-31 NOTE — Assessment & Plan Note (Signed)
His blood pressure remains elevated. He is on fairly good medical therapy, and we are limited by his renal insufficiency. He needs to lose weight and eat less salt and I've strongly encouraged him in this direction.

## 2015-03-31 NOTE — Patient Instructions (Signed)
Medication Instructions:  Your physician recommends that you continue on your current medications as directed. Please refer to the Current Medication list given to you today.  Labwork: None ordered  Testing/Procedures: None ordered  Follow-Up: Remote monitoring is used to monitor your Pacemaker of ICD from home. This monitoring reduces the number of office visits required to check your device to one time per year. It allows us to keep an eye on the functioning of your device to ensure it is working properly. You are scheduled for a device check from home on 06/30/2015. You may send your transmission at any time that day. If you have a wireless device, the transmission will be sent automatically. After your physician reviews your transmission, you will receive a postcard with your next transmission date.  Your physician wants you to follow-up in: 1 year with Dr. Ladona Ridgelaylor. You will receive a reminder letter in the mail two months in advance. If you don't receive a letter, please call our office to schedule the follow-up appointment.   Any Other Special Instructions Will Be Listed Below (If Applicable).  If you need a refill on your cardiac medications before your next appointment, please call your pharmacy.  Thank you for choosing [facility]!!

## 2015-03-31 NOTE — Assessment & Plan Note (Signed)
His St. Jude DDD PM is working normally. Will recheck in several months. 

## 2015-04-20 ENCOUNTER — Encounter: Payer: Self-pay | Admitting: Internal Medicine

## 2015-04-21 ENCOUNTER — Encounter: Payer: Medicare PPO | Attending: Internal Medicine | Admitting: Dietician

## 2015-04-21 ENCOUNTER — Encounter: Payer: Self-pay | Admitting: Dietician

## 2015-04-21 VITALS — Ht 70.0 in | Wt 278.0 lb

## 2015-04-21 DIAGNOSIS — E0821 Diabetes mellitus due to underlying condition with diabetic nephropathy: Secondary | ICD-10-CM

## 2015-04-21 DIAGNOSIS — E119 Type 2 diabetes mellitus without complications: Secondary | ICD-10-CM | POA: Insufficient documentation

## 2015-04-21 NOTE — Patient Instructions (Signed)
Plan:  Aim for 3 Carb Choices per meal (45 grams) +/- 1 either way  Aim for 0-1 Carbs per snack if hungry and each night Include protein in moderation with your meals and snacks Consider reading food labels for Total Carbohydrate and Fat Grams of foods Consider  increasing your activity level by walking for 15 minutes daily as tolerated Consider checking BG at alternate times per day as directed by MD  Consider speaking with your doctor about the insulin amounts. Recommend avoiding processed meat or choose lower sodium (ham, lunch meat, sausage, bacon) Choose low sodium cheese Limit added salt to foods and rinse canned vegetables/beans

## 2015-04-21 NOTE — Progress Notes (Addendum)
Diabetes Self-Management Education  Visit Type: First/Initial  Appt. Start Time: 0900 Appt. End Time: 1030  04/21/2015  [name], identified by name and date of birth, is a 80 y.o. male with a diagnosis of Diabetes: Type 2 for the past 32 years.  Other hx includes:  HTN, hyperlipidemia and CRI with GFR of 52.  He is taking Humalin 70/30 60-65 units with each meal and Lantus 20-25 units every evening.  They are worried about a couple extreme low blood sugars overnight in October.  At times, he has hypoglycemia unawareness.  Called patient to confirm insulin use.  He states that he has been on a similar 70/30 regime for years.  Discussed concerns that insulin is "stacking" too much causing lows (in addition to increased exercise). He is to discuss with MD next week.  Noted K+ of 5.0.  Recommend continue monitor.  Patient can follow up her for further diet eduction as needed regarding renal concerns in the future.  He lives with his wife.  She is here with him today and she prepares the meals.  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  His son is a International aid/development workerveterinarian.  They travel often.  ASSESSMENT  Height 5\' 10"  (1.778 m), weight 278 lb (126.1 kg). Body mass index is 39.89 kg/(m^2).      Diabetes Self-Management Education - 04/21/15 0911    Visit Information   Visit Type First/Initial   Initial Visit   Diabetes Type Type 2   Are you currently following a meal plan? No   Are you taking your medications as prescribed? Yes   Date Diagnosed 1984   Health Coping   How would you rate your overall health? Good   Psychosocial Assessment   Patient Belief/Attitude about Diabetes Motivated to manage diabetes   Self-care barriers None   Self-management support Doctor's office;Family   Other persons present Patient;Spouse/SO   Patient Concerns Nutrition/Meal planning;Weight Control;Glycemic Control   Special Needs None   Preferred Learning Style No preference indicated   Learning Readiness Ready    How often do you need to have someone help you when you read instructions, pamphlets, or other written materials from your doctor or pharmacy? 1 - Never   What is the last grade level you completed in school? 6 years .  Education.   Complications   Last HgB A1C per patient/outside source 7.3 %  12/31/14   How often do you check your blood sugar? > 4 times/day   Fasting Blood glucose range (mg/dL) 409-811;[phone];180-200   Postprandial Blood glucose range (mg/dL) [phone]   Number of hypoglycemic episodes per month 1   Can you tell when your blood sugar is low? No   Number of hyperglycemic episodes per week 3   Can you tell when your blood sugar is high? No   Have you had a dilated eye exam in the past 12 months? Yes   Have you had a dental exam in the past 12 months? Yes   Are you checking your feet? Yes   How many days per week are you checking your feet? 7   Dietary Intake   Breakfast 2 slices bacon or sausage and 1 egg and adds 1 slice [employer] toast with sugar free jelly on Sunday  7-9   Snack (morning) none   Lunch lunch meat sandwich on [employer] or 5 saltines and lunch meat   Snack (afternoon) 5 saltine crackers   Dinner Meat, starch, vegetables, dried beans   Snack (evening) cheetos,  snack mix, clementine, ham and cheese   Beverage(s) water, crystal lite, occasional diet coke, seldom juice   Exercise   Exercise Type Light (walking / raking leaves)  works in the yard 1-2 x per week except in the winter.  Treadmill upstairs but has not been using secondary   How many days per week to you exercise? 1   How many minutes per day do you exercise? 30   Total minutes per week of exercise 30   Patient Education   Previous Diabetes Education Yes (please comment)  30 years ago   Disease state  Explored patient's options for treatment of their diabetes   Nutrition management  Role of diet in the treatment of diabetes and the relationship between the three main macronutrients and blood glucose level;Food  label reading, portion sizes and measuring food.;Meal timing in regards to the patients' current diabetes medication.;Meal options for control of blood glucose level and chronic complications.   Physical activity and exercise  Role of exercise on diabetes management, blood pressure control and cardiac health.   Monitoring Identified appropriate SMBG and/or A1C goals.;Daily foot exams   Acute complications Taught treatment of hypoglycemia - the 15 rule.   Psychosocial adjustment Worked with patient to identify barriers to care and solutions;Role of stress on diabetes;Travel strategies   Individualized Goals (developed by patient)   Nutrition Follow meal plan discussed   Physical Activity Exercise 3-5 times per week;15 minutes per day   Medications take my medication as prescribed   Monitoring  test my blood glucose as discussed   Reducing Risk examine blood glucose patterns;do foot checks daily   Outcomes   Expected Outcomes Demonstrated interest in learning. Expect positive outcomes   Future DMSE PRN   Program Status Completed      Individualized Plan for Diabetes Self-Management Training:   Learning Objective:  Patient will have a greater understanding of diabetes self-management. Patient education plan is to attend individual and/or group sessions per assessed needs and concerns.   Plan:   Patient Instructions  Plan:  Aim for 3 Carb Choices per meal (45 grams) +/- 1 either way  Aim for 0-1 Carbs per snack if hungry and each night Include protein in moderation with your meals and snacks Consider reading food labels for Total Carbohydrate and Fat Grams of foods Consider  increasing your activity level by walking for 15 minutes daily as tolerated Consider checking BG at alternate times per day as directed by MD  Consider speaking with your doctor about the insulin amounts. Recommend avoiding processed meat or choose lower sodium (ham, lunch meat, sausage, bacon) Choose low sodium  cheese Limit added salt to foods and rinse canned vegetables/beans    Expected Outcomes:  Demonstrated interest in learning. Expect positive outcomes  Education material provided: Living Well with Diabetes, Food label handouts, Meal plan card, My Plate and Snack sheet  If problems or questions, patient to contact team via:  Phone and Email  Future DSME appointment: PRN

## 2015-04-28 ENCOUNTER — Other Ambulatory Visit (INDEPENDENT_AMBULATORY_CARE_PROVIDER_SITE_OTHER): Payer: Medicare PPO

## 2015-04-28 ENCOUNTER — Ambulatory Visit (INDEPENDENT_AMBULATORY_CARE_PROVIDER_SITE_OTHER): Payer: Medicare PPO | Admitting: Internal Medicine

## 2015-04-28 ENCOUNTER — Encounter: Payer: Self-pay | Admitting: Internal Medicine

## 2015-04-28 VITALS — BP 124/84 | HR 73 | Temp 97.7°F | Resp 16 | Ht 70.0 in | Wt 277.0 lb

## 2015-04-28 DIAGNOSIS — E785 Hyperlipidemia, unspecified: Secondary | ICD-10-CM | POA: Diagnosis not present

## 2015-04-28 DIAGNOSIS — E1321 Other specified diabetes mellitus with diabetic nephropathy: Secondary | ICD-10-CM

## 2015-04-28 DIAGNOSIS — I10 Essential (primary) hypertension: Secondary | ICD-10-CM

## 2015-04-28 DIAGNOSIS — Z794 Long term (current) use of insulin: Secondary | ICD-10-CM | POA: Diagnosis not present

## 2015-04-28 LAB — LIPID PANEL
CHOL/HDL RATIO: 3
CHOLESTEROL: 158 mg/dL (ref 0–200)
HDL: 50.9 mg/dL (ref >39.00)
LDL CALC: 91 mg/dL (ref 0–99)
NonHDL: 107.05
TRIGLYCERIDES: 80 mg/dL (ref 0.0–149.0)
VLDL: 16 mg/dL (ref 0.0–40.0)

## 2015-04-28 LAB — BASIC METABOLIC PANEL
BUN: 27 mg/dL — AB (ref 6–23)
CALCIUM: 9.2 mg/dL (ref 8.4–10.5)
CO2: 28 mEq/L (ref 19–32)
CREATININE: 1.63 mg/dL — AB (ref 0.40–1.50)
Chloride: 101 mEq/L (ref 96–112)
GFR: 52.85 mL/min — AB (ref >60.00)
GLUCOSE: 135 mg/dL — AB (ref 70–99)
Potassium: 4.1 mEq/L (ref 3.5–5.1)
Sodium: 137 mEq/L (ref 135–145)

## 2015-04-28 LAB — HEMOGLOBIN A1C: HEMOGLOBIN A1C: 7.3 % — AB (ref 4.6–6.5)

## 2015-04-28 MED ORDER — FUROSEMIDE 40 MG PO TABS
ORAL_TABLET | ORAL | Status: DC
Start: 2015-04-28 — End: 2016-01-03

## 2015-04-28 MED ORDER — INSULIN PEN NEEDLE 31G X 8 MM MISC: Status: DC

## 2015-04-28 MED ORDER — CARVEDILOL 6.25 MG PO TABS: 6.2500 mg | ORAL_TABLET | Freq: Two times a day (BID) | ORAL | Status: DC

## 2015-04-28 NOTE — Progress Notes (Signed)
Subjective:  Patient ID: Connor Dixon, male    DOB: Jan 14, 1935  Age: 80 y.o. MRN: [id number]  CC: Hypertension; Hyperlipidemia; and Diabetes   HPI Connor Dixon presents for f/up - he feels well and offers no complaints.  Outpatient Prescriptions Prior to Visit  Medication Sig Dispense Refill  . allopurinol (ZYLOPRIM) 100 MG tablet Take 1 tablet (100 mg total) by mouth daily. 90 tablet 3  . amoxicillin (AMOXIL) 500 MG capsule Take 1 capsule (500 mg total) by mouth 3 (three) times daily. Take ONLY when having dental procedures 30 capsule 11  . aspirin EC 81 MG tablet Take 81 mg by mouth daily.    . Blood Glucose Monitoring Suppl (FREESTYLE LITE) DEVI by Does not apply route 5 (five) times daily.    . Blood Glucose Monitoring Suppl (ONETOUCH ULTRALINK) W/DEVICE KIT by Does not apply route.    . Insulin Glargine (LANTUS SOLOSTAR) 100 UNIT/ML Solostar Pen Inject 20 Units into the skin daily at 10 pm. (Patient taking differently: Inject 25 Units into the skin daily at 10 pm. ) 21 mL 3  . insulin NPH-regular Human (HUMULIN 70/30) (70-30) 100 UNIT/ML injection INJECT 60 UNITS AS DIRECTED (Patient taking differently: 65 Units. INJECT 60 UNITS AS DIRECTED) 60 mL 5  . Insulin Syringe-Needle U-100 (INSULIN SYRINGE .5CC/31GX5/16") 31G X 5/16" 0.5 ML MISC USE FOUR TIMES DAILY AS DIRECTED 120 each 3  . loratadine (CLARITIN) 10 MG tablet Take 10 mg by mouth daily as needed for allergies.     Marland Kitchen losartan (COZAAR) 50 MG tablet Take 0.5 tablets (25 mg total) by mouth daily. 45 tablet 11  . ONE TOUCH LANCETS MISC 1 Act by Does not apply route 3 (three) times daily. 200 each 11  . ONETOUCH VERIO test strip TEST 5 TIMES DAILY 500 each 3  . pravastatin (PRAVACHOL) 40 MG tablet TAKE 1 TABLET BY MOUTH EVERY DAY 90 tablet 3  . B-D ULTRAFINE III SHORT PEN 31G X 8 MM MISC AS DIRECTED WITH INSULIN PENS 100 each 3  . carvedilol (COREG) 6.25 MG tablet TAKE 1 TABLET TWICE DAILY 60 tablet 9  . furosemide (LASIX) 40 MG  tablet TAKE 1 TABLET BY MOUTH EVERY MORNING AND 1/2 TABLET BY MOUTH AT 1 PM 135 tablet 1   No facility-administered medications prior to visit.    ROS Review of Systems  Constitutional: Negative.  Negative for fever, chills, diaphoresis, appetite change and fatigue.  HENT: Negative.   Eyes: Negative.  Negative for visual disturbance.  Respiratory: Negative.  Negative for cough, choking, chest tightness, shortness of breath and stridor.   Cardiovascular: Negative.  Negative for chest pain, palpitations and leg swelling.  Gastrointestinal: Negative.  Negative for nausea, vomiting, abdominal pain, diarrhea and constipation.  Endocrine: Negative.  Negative for polydipsia, polyphagia and polyuria.  Genitourinary: Negative.  Negative for dysuria, urgency, frequency, hematuria and difficulty urinating.  Musculoskeletal: Negative for myalgias and arthralgias.  Skin: Negative.   Allergic/Immunologic: Negative.   Neurological: Negative.  Negative for dizziness.  Hematological: Negative.  Negative for adenopathy. Does not bruise/bleed easily.  Psychiatric/Behavioral: Negative.     Objective:  BP 124/84 mmHg  Pulse 73  Temp(Src) 97.7 F (36.5 C) (Oral)  Resp 16  Ht '5\' 10"'  (1.778 m)  Wt 277 lb (125.646 kg)  BMI 39.75 kg/m2  SpO2 98%  BP Readings from Last 3 Encounters:  04/28/15 124/84  03/31/15 150/80  03/17/15 124/76    Wt Readings from Last 3 Encounters:  04/28/15  277 lb (125.646 kg)  04/21/15 278 lb (126.1 kg)  03/31/15 278 lb (126.1 kg)    Physical Exam  Constitutional: No distress.  HENT:  Mouth/Throat: Oropharynx is clear and moist. No oropharyngeal exudate.  Eyes: Conjunctivae are normal. Right eye exhibits no discharge. Left eye exhibits no discharge. No scleral icterus.  Neck: Normal range of motion. Neck supple. No JVD present. No tracheal deviation present. No thyromegaly present.  Cardiovascular: Normal rate, regular rhythm, normal heart sounds and intact distal  pulses.  Exam reveals no gallop and no friction rub.   No murmur heard. Pulmonary/Chest: Effort normal and breath sounds normal. No stridor. No respiratory distress. He has no wheezes. He has no rales. He exhibits no tenderness.  Abdominal: Soft. Bowel sounds are normal. He exhibits no distension and no mass. There is no tenderness. There is no rebound and no guarding.  Musculoskeletal: Normal range of motion. He exhibits no edema or tenderness.  Lymphadenopathy:    He has no cervical adenopathy.  Skin: Skin is warm and dry. No rash noted. He is not diaphoretic. No erythema. No pallor.  Vitals reviewed.   Lab Results  Component Value Date   WBC 6.1 03/26/2015   HGB 15.0 03/26/2015   HCT 43 03/26/2015   PLT 179 03/26/2015   GLUCOSE 135* 04/28/2015   CHOL 158 04/28/2015   TRIG 80.0 04/28/2015   HDL 50.90 04/28/2015   LDLCALC 91 04/28/2015   ALT 18 03/26/2015   AST 23 03/26/2015   NA 137 04/28/2015   K 4.1 04/28/2015   CL 101 04/28/2015   CREATININE 1.63* 04/28/2015   BUN 27* 04/28/2015   CO2 28 04/28/2015   TSH 1.68 12/31/2014   PSA 1.77 01/06/2010   HGBA1C 7.3* 04/28/2015   MICROALBUR 192.3* 12/31/2014    No results found.  Assessment & Plan:   Noam was seen today for hypertension, hyperlipidemia and diabetes.  Diagnoses and all orders for this visit:  Other specified diabetes mellitus with diabetic nephropathy, with long-term current use of insulin ([place])- since I last saw him he has seen a diabetic educator has had no more episodes of hypoglycemia. The A1c says that his blood sugars are well-controlled, he will continue his current regimen. -     Basic metabolic panel; Future -     Hemoglobin A1c; Future  Essential hypertension- blood pressure is well-controlled, lites and renal function are stable. -     Basic metabolic panel; Future  Hyperlipidemia with target LDL less than 70- he is achieved his LDL goal and is doing well on the statin. -     Lipid panel;  Future  Other orders -     furosemide (LASIX) 40 MG tablet; TAKE 1 TABLET BY MOUTH EVERY MORNING AND 1/2 TABLET BY MOUTH AT 1 PM -     carvedilol (COREG) 6.25 MG tablet; Take 1 tablet (6.25 mg total) by mouth 2 (two) times daily. -     Insulin Pen Needle (B-D ULTRAFINE III SHORT PEN) 31G X 8 MM MISC; AS DIRECTED WITH INSULIN PENS   I have changed Mr. Phillis's B-D ULTRAFINE III SHORT PEN to Insulin Pen Needle. I have also changed his carvedilol. I am also having him maintain his aspirin EC, loratadine, FREESTYLE LITE, losartan, pravastatin, ONETOUCH ULTRALINK, INSULIN SYRINGE .5CC/31GX5/16", ONE TOUCH LANCETS, amoxicillin, ONETOUCH VERIO, allopurinol, Insulin Glargine, insulin NPH-regular Human, and furosemide.  Meds ordered this encounter  Medications  . furosemide (LASIX) 40 MG tablet    Sig: TAKE 1  TABLET BY MOUTH EVERY MORNING AND 1/2 TABLET BY MOUTH AT 1 PM    Dispense:  135 tablet    Refill:  1  . carvedilol (COREG) 6.25 MG tablet    Sig: Take 1 tablet (6.25 mg total) by mouth 2 (two) times daily.    Dispense:  60 tablet    Refill:  9  . Insulin Pen Needle (B-D ULTRAFINE III SHORT PEN) 31G X 8 MM MISC    Sig: AS DIRECTED WITH INSULIN PENS    Dispense:  100 each    Refill:  3     Follow-up: Return in about 4 months (around 08/27/2015).  Scarlette Calico, MD

## 2015-04-28 NOTE — Patient Instructions (Signed)

## 2015-04-28 NOTE — Progress Notes (Signed)
Pre visit review using our clinic review tool, if applicable. No additional management support is needed unless otherwise documented below in the visit note. 

## 2015-06-16 ENCOUNTER — Other Ambulatory Visit: Payer: Self-pay | Admitting: Internal Medicine

## 2015-06-30 ENCOUNTER — Ambulatory Visit (INDEPENDENT_AMBULATORY_CARE_PROVIDER_SITE_OTHER): Payer: Medicare Other | Admitting: *Deleted

## 2015-06-30 DIAGNOSIS — I442 Atrioventricular block, complete: Secondary | ICD-10-CM | POA: Diagnosis not present

## 2015-06-30 NOTE — Progress Notes (Signed)
Remote pacemaker transmission.   

## 2015-07-01 ENCOUNTER — Encounter: Payer: Self-pay | Admitting: Cardiology

## 2015-07-01 LAB — CUP PACEART REMOTE DEVICE CHECK
Battery Voltage: 2.9 V
Brady Statistic AP VP Percent: 4.8 %
Brady Statistic AS VP Percent: 94 %
Brady Statistic RA Percent Paced: 3.7 %
Date Time Interrogation Session: 20170214074945
Implantable Lead Location: 753859
Lead Channel Impedance Value: 400 Ohm
Lead Channel Impedance Value: 440 Ohm
Lead Channel Pacing Threshold Amplitude: 1.125 V
Lead Channel Pacing Threshold Pulse Width: 0.4 ms
Lead Channel Sensing Intrinsic Amplitude: 1.5 mV
Lead Channel Sensing Intrinsic Amplitude: 9 mV
Lead Channel Setting Pacing Amplitude: 1.375
Lead Channel Setting Pacing Amplitude: 2 V
Lead Channel Setting Pacing Pulse Width: 0.4 ms
Lead Channel Setting Sensing Sensitivity: 3 mV
MDC IDC LEAD IMPLANT DT: 20111202
MDC IDC LEAD IMPLANT DT: 20111202
MDC IDC LEAD LOCATION: 753860
MDC IDC MSMT BATTERY REMAINING LONGEVITY: 80 mo
MDC IDC MSMT BATTERY REMAINING PERCENTAGE: 65 %
MDC IDC PG SERIAL: 7192434
MDC IDC STAT BRADY AP VS PERCENT: 1 %
MDC IDC STAT BRADY AS VS PERCENT: 1 %
MDC IDC STAT BRADY RV PERCENT PACED: 99 %

## 2015-07-17 ENCOUNTER — Telehealth: Payer: Self-pay | Admitting: *Deleted

## 2015-07-17 NOTE — Telephone Encounter (Signed)
[facility] schedule [facility] on remote transmission from 06/30/15.  Patient is agreeable to appointment on 07/23/15 at 2:45pm.  He has billing/insurance questions, but states he will bring his insurance card and his questions to this appointment to speak with the front desk staff.  He declines to take the billing office phone number right now.  Patient is appreciative of call and denies any additional questions or concerns at this time.

## 2015-07-23 ENCOUNTER — Encounter: Payer: Self-pay | Admitting: Internal Medicine

## 2015-07-23 ENCOUNTER — Ambulatory Visit (INDEPENDENT_AMBULATORY_CARE_PROVIDER_SITE_OTHER): Payer: Medicare Other | Admitting: Internal Medicine

## 2015-07-23 VITALS — BP 140/90 | HR 80 | Ht 70.0 in | Wt 281.1 lb

## 2015-07-23 DIAGNOSIS — I480 Paroxysmal atrial fibrillation: Secondary | ICD-10-CM | POA: Diagnosis not present

## 2015-07-23 MED ORDER — APIXABAN 5 MG PO TABS
5.0000 mg | ORAL_TABLET | Freq: Two times a day (BID) | ORAL | Status: DC
Start: 2015-07-23 — End: 2016-06-19

## 2015-07-23 NOTE — Progress Notes (Signed)
HPI Mr. Loiseau he is a very pleasant morbidly obese 81 year old man with a history of complete heart block, status post permanent pacemaker insertion. He also has a history of diabetes, hypertension, CRI, and peripheral edema. He remains active, working in his yard. No syncope. No chest pain or shortness of breath. He denies palpitations.  Allergies  Allergen Reactions  . Oysters [Shellfish Allergy]     Blisters and swelling  . Apple Other (See Comments)    Sore throat     Current Outpatient Prescriptions  Medication Sig Dispense Refill  . allopurinol (ZYLOPRIM) 100 MG tablet Take 1 tablet (100 mg total) by mouth daily. 90 tablet 3  . amoxicillin (AMOXIL) 500 MG capsule Take 1 capsule (500 mg total) by mouth 3 (three) times daily. Take ONLY when having dental procedures 30 capsule 11  . aspirin EC 81 MG tablet Take 81 mg by mouth daily.    . Blood Glucose Monitoring Suppl (FREESTYLE LITE) DEVI by Does not apply route 5 (five) times daily.    . Blood Glucose Monitoring Suppl (ONETOUCH ULTRALINK) W/DEVICE KIT by Does not apply route.    . carvedilol (COREG) 6.25 MG tablet Take 1 tablet (6.25 mg total) by mouth 2 (two) times daily. 60 tablet 9  . furosemide (LASIX) 40 MG tablet TAKE 1 TABLET BY MOUTH EVERY MORNING AND 1/2 TABLET BY MOUTH AT 1 PM 135 tablet 1  . insulin glargine (LANTUS) 100 UNIT/ML injection Inject 25 Units into the skin at bedtime.    . insulin NPH-regular Human (NOVOLIN 70/30) (70-30) 100 UNIT/ML injection Inject 65 Units into the skin 3 (three) times daily with meals.    . Insulin Pen Needle (B-D ULTRAFINE III SHORT PEN) 31G X 8 MM MISC AS DIRECTED WITH INSULIN PENS 100 each 3  . Insulin Syringe-Needle U-100 (INSULIN SYRINGE .5CC/31GX5/16") 31G X 5/16" 0.5 ML MISC USE FOUR TIMES DAILY AS DIRECTED 120 each 3  . loratadine (CLARITIN) 10 MG tablet Take 10 mg by mouth daily as needed for allergies.     Marland Kitchen losartan (COZAAR) 50 MG tablet Take 0.5 tablets (25 mg total) by mouth  daily. 45 tablet 11  . ONE TOUCH LANCETS MISC 1 Act by Does not apply route 3 (three) times daily. 200 each 11  . ONETOUCH VERIO test strip TEST 5 TIMES DAILY 500 each 3  . pravastatin (PRAVACHOL) 40 MG tablet Take 40 mg by mouth daily.    Marland Kitchen apixaban (ELIQUIS) 5 MG TABS tablet Take 1 tablet (5 mg total) by mouth 2 (two) times daily. 60 tablet 11   No current facility-administered medications for this visit.     Past Medical History  Diagnosis Date  . Atrioventricular block, complete ([place])   . Hypopotassemia   . Other and unspecified angina pectoris     typical  . Arthus phenomenon   . Morbid obesity ([place])   . Essential hypertension, benign 08/06/2007    Very good BP control  . HLD (hyperlipidemia)   . DM (diabetes mellitus) ([place])     adult onset   . Cardiac pacemaker in situ 07/22/2010  . DIABETES MELLITUS, TYPE I, ADULT ONSET 08/06/2007  . History of second degree heart block   . HYPERLIPIDEMIA 08/06/2007  . Obesity   . Chronic kidney disease, stage 3     Presumably from longstanding DM & HTN. Scr has gradually increased over the past 2 years (1.6 in 12/2010, 2 in 03/2011, and up to 2.1 in 01/2013)  . Acute on  chronic diastolic congestive heart failure ([place])   . Type I (juvenile type) diabetes mellitus without mention of complication, not stated as uncontrolled     (04/25/13 OV with Dr. Marval Regal) = Poorly controlled & is working  w/Dr. Crissie Sickles to improve this. Most recent Hgb A1c was 8.2% but Mr. Nott reports it was down to 7.6 last month.    ROS:   All systems reviewed and negative except as noted in the HPI.   Past Surgical History  Procedure Laterality Date  . Total hip arthroplasty  04/05/06    right  . Cataract extraction  8/08    [place]  . Ptvdp  12/11    PPM - St. Jude     Family History  Problem Relation Age of Onset  . Colon cancer Neg Hx     no definate prostate CA   . Asthma Father   . Coronary artery disease Father   . Diabetes Father   .  Hyperlipidemia Father   . Coronary artery disease Mother   . Asthma Mother   . Heart attack Mother 76  . Stroke Father 54     Social History   Social History  . Marital Status: Married    Spouse Name: N/A  . Number of Children: N/A  . Years of Education: N/A   Occupational History  . Not on file.   Social History Main Topics  . Smoking status: Never Smoker   . Smokeless tobacco: Never Used  . Alcohol Use: No  . Drug Use: No  . Sexual Activity: Not Currently   Other Topics Concern  . Not on file   Social History Narrative   [employer] BA, 2 Master's degrees. [place] - for certification Education specialist.    Retired Tree surgeon.   Active - travels.   Married - 1961; 2 sons ([place]son) and 5 grandchildren.        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      BP 140/90 mmHg  Pulse 80  Ht 5' 10" (1.778 m)  Wt 281 lb 1.9 oz (127.515 kg)  BMI 40.34 kg/m2  Physical Exam:  Well appearing obese 81 year old man, NAD HEENT: Unremarkable Neck:  7 cm JVD, no thyromegally Lungs:  Clear with no wheezes, rales, or rhonchi. HEART:  Regular rate rhythm, no murmurs, no rubs, no clicks Abd:  soft, positive bowel sounds, no organomegally, no rebound, no guarding Ext:  2 plus pulses, no edema, no cyanosis, no clubbing Skin:  No rashes no nodules Neuro:  CN II through XII intact, motor grossly intact   DEVICE  Normal device function.  See PaceArt for details.   Assess/Plan:  1. PAF - this is a new problem. The patient has been asymptomatic but he has had upto 5 minutes on PPM interogation. His father and brother have had strokes. His CHADSVASC score is 4 and I have recommended her be started on systemi anticoagulation. 2. Complete heart block - he is asymptomatic s/p PPM insertion. 3. PPM - his St. Jude DDD PM is working normally. Will recheck in several months. 4. Obesity - I have discussed the importance of weight loss. He will try to do  better.  Mikle Bosworth.D.

## 2015-07-23 NOTE — Patient Instructions (Signed)
Medication Instructions:  Your physician has recommended you make the following change in your medication:  1) Start Eliquis 5 mg twice daily 2) Take an extra Lasix if your weight goes up by more than 2 pounds in a day    Labwork: None ordered   Testing/Procedures: Your physician has requested that you have an echocardiogram. Echocardiography is a painless test that uses sound waves to create images of your heart. It provides your doctor with information about the size and shape of your heart and how well your heart's chambers and valves are working. This procedure takes approximately one hour. There are no restrictions for this procedure.     Follow-Up: Your physician wants you to follow-up in: 12 months with Dr Court Joyaylor You will receive a reminder letter in the mail two months in advance. If you don't receive a letter, please call our office to schedule the follow-up appointment.  Remote monitoring is used to monitor your Pacemaker  from home. This monitoring reduces the number of office visits required to check your device to one time per year. It allows us to keep an eye on the functioning of your device to ensure it is working properly. You are scheduled for a device check from home on 10/22/15. You may send your transmission at any time that day. If you have a wireless device, the transmission will be sent automatically. After your physician reviews your transmission, you will receive a postcard with your next transmission date.     Any Other Special Instructions Will Be Listed Below (If Applicable).     If you need a refill on your cardiac medications before your next appointment, please call your pharmacy.

## 2015-07-27 ENCOUNTER — Telehealth: Payer: Self-pay | Admitting: Internal Medicine

## 2015-07-27 NOTE — Telephone Encounter (Signed)
Pt said he started taking Eliquis on Friday,should he still take his Aspirin?

## 2015-07-27 NOTE — Telephone Encounter (Signed)
Spoke with the patient and he will stop Aspirin

## 2015-08-07 ENCOUNTER — Other Ambulatory Visit (HOSPITAL_COMMUNITY): Payer: Self-pay | Admitting: Cardiology

## 2015-08-07 ENCOUNTER — Ambulatory Visit (HOSPITAL_COMMUNITY): Payer: Medicare Other | Attending: Cardiovascular Disease

## 2015-08-07 ENCOUNTER — Other Ambulatory Visit: Payer: Self-pay | Admitting: Nurse Practitioner

## 2015-08-07 ENCOUNTER — Other Ambulatory Visit: Payer: Self-pay

## 2015-08-07 DIAGNOSIS — I480 Paroxysmal atrial fibrillation: Secondary | ICD-10-CM | POA: Diagnosis not present

## 2015-08-07 LAB — CUP PACEART INCLINIC DEVICE CHECK
Date Time Interrogation Session: 20170324164143
Implantable Lead Implant Date: 20111202
Implantable Lead Location: 753859
Implantable Lead Location: 753860
MDC IDC LEAD IMPLANT DT: 20111202
Pulse Gen Model: 2210
Pulse Gen Serial Number: 7192434

## 2015-08-07 MED ORDER — PERFLUTREN LIPID MICROSPHERE: 2.0000 mL | Freq: Once | INTRAVENOUS | Status: DC

## 2015-08-07 MED ORDER — PERFLUTREN LIPID MICROSPHERE
2.0000 mL | Freq: Once | INTRAVENOUS | Status: AC
  Administered 2015-08-07: 2 mL via INTRAVENOUS

## 2015-08-19 ENCOUNTER — Encounter: Payer: Self-pay | Admitting: Gastroenterology

## 2015-08-20 ENCOUNTER — Telehealth: Payer: Self-pay | Admitting: Internal Medicine

## 2015-08-20 NOTE — Telephone Encounter (Signed)
New Message:   Please call,concerning his Eliqius.

## 2015-08-20 NOTE — Telephone Encounter (Signed)
Prior auth for Eliquis 5 mg submitted to [facility]. 

## 2015-08-21 ENCOUNTER — Telehealth: Payer: Self-pay

## 2015-08-21 NOTE — Telephone Encounter (Signed)
Eliquis approved through 05/15/2016. [place] - [id number].

## 2015-09-03 LAB — HM DIABETES EYE EXAM

## 2015-09-06 ENCOUNTER — Other Ambulatory Visit: Payer: Self-pay | Admitting: Internal Medicine

## 2015-10-14 ENCOUNTER — Encounter: Payer: Self-pay | Admitting: Podiatry

## 2015-10-14 ENCOUNTER — Ambulatory Visit (INDEPENDENT_AMBULATORY_CARE_PROVIDER_SITE_OTHER): Payer: Medicare Other | Admitting: Podiatry

## 2015-10-14 DIAGNOSIS — M779 Enthesopathy, unspecified: Secondary | ICD-10-CM

## 2015-10-14 DIAGNOSIS — M25472 Effusion, left ankle: Secondary | ICD-10-CM

## 2015-10-14 DIAGNOSIS — R600 Localized edema: Secondary | ICD-10-CM | POA: Diagnosis not present

## 2015-10-14 NOTE — Progress Notes (Signed)
Subjective:     Patient ID: Connor Dixon, male   DOB: 06/19/1934, 81 y.o.   MRN: [id number]  HPI patient states she's developed swelling of his left foot and ankle over the last month and he's had a previous history of this problem   Review of Systems     Objective:   Physical Exam Neurovascular status was intact and negative Homans sign was noted. Patient's found to have edema +1 to +2 pitting in the left foot and left ankle and this patient is getting ready to go to [place] in the next 10 days and was concerned about this but states it's been present for a month with no pain in his leg or no shortness of breath    Assessment:     Appears to be localized inflammatory condition but cannot rule out any other pathology    Plan:     At this time we may send him for Doppler but first metatarsal reduce the swelling and applied Unna boot Ace wrap surgical shoe and instructed on elevation. He also was dispensed and anklet for compression and will reappoint Monday for reevaluation

## 2015-10-19 ENCOUNTER — Ambulatory Visit (INDEPENDENT_AMBULATORY_CARE_PROVIDER_SITE_OTHER): Payer: Medicare Other | Admitting: Podiatry

## 2015-10-19 ENCOUNTER — Encounter: Payer: Self-pay | Admitting: Podiatry

## 2015-10-19 VITALS — BP 158/83 | HR 86 | Resp 16

## 2015-10-19 DIAGNOSIS — R600 Localized edema: Secondary | ICD-10-CM | POA: Diagnosis not present

## 2015-10-19 DIAGNOSIS — M25472 Effusion, left ankle: Secondary | ICD-10-CM

## 2015-10-19 DIAGNOSIS — M779 Enthesopathy, unspecified: Secondary | ICD-10-CM

## 2015-10-21 NOTE — Progress Notes (Signed)
Subjective:     Patient ID: Connor Dixon, male   DOB: 01/27/1935, 81 y.o.   MRN: [id number]  HPI patient states that the pain is down in his left foot and he's walking better at this time   Review of Systems     Objective:   Physical Exam Neurovascular status intact muscle strength adequate with significant diminishment of discomfort left foot with edema that still present but improved from previous visit    Assessment:     Doing well post compression of the left foot and ankle with negative Homans sign noted    Plan:     Advised on physical therapy anti-inflammatories and elevation along with continued compression. Gave strict instructions if any swelling in the leg should occur or any shortness of breath he is to let us know immediately or go to the emergency room if it seems that he is developing this significantly

## 2015-10-22 ENCOUNTER — Ambulatory Visit (INDEPENDENT_AMBULATORY_CARE_PROVIDER_SITE_OTHER): Payer: Medicare Other | Admitting: *Deleted

## 2015-10-22 DIAGNOSIS — I442 Atrioventricular block, complete: Secondary | ICD-10-CM | POA: Diagnosis not present

## 2015-10-22 NOTE — Progress Notes (Signed)
Remote pacemaker transmission.   

## 2015-10-30 LAB — CUP PACEART REMOTE DEVICE CHECK
Battery Remaining Longevity: 70 mo
Brady Statistic AP VS Percent: 1 %
Date Time Interrogation Session: 20170608062940
Implantable Lead Implant Date: 20111202
Implantable Lead Location: 753860
Lead Channel Pacing Threshold Amplitude: 0.75 V
Lead Channel Pacing Threshold Amplitude: 1.25 V
Lead Channel Setting Pacing Amplitude: 1.5 V
Lead Channel Setting Pacing Amplitude: 2 V
Lead Channel Setting Sensing Sensitivity: 4 mV
MDC IDC LEAD IMPLANT DT: 20111202
MDC IDC LEAD LOCATION: 753859
MDC IDC MSMT BATTERY REMAINING PERCENTAGE: 57 %
MDC IDC MSMT BATTERY VOLTAGE: 2.89 V
MDC IDC MSMT LEADCHNL RA IMPEDANCE VALUE: 440 Ohm
MDC IDC MSMT LEADCHNL RA PACING THRESHOLD PULSEWIDTH: 0.4 ms
MDC IDC MSMT LEADCHNL RV IMPEDANCE VALUE: 400 Ohm
MDC IDC MSMT LEADCHNL RV PACING THRESHOLD PULSEWIDTH: 0.4 ms
MDC IDC MSMT LEADCHNL RV SENSING INTR AMPL: 9 mV
MDC IDC SET LEADCHNL RV PACING PULSEWIDTH: 0.4 ms
MDC IDC STAT BRADY AP VP PERCENT: 2.5 %
MDC IDC STAT BRADY AS VP PERCENT: 96 %
MDC IDC STAT BRADY AS VS PERCENT: 1 %
MDC IDC STAT BRADY RA PERCENT PACED: 1.8 %
MDC IDC STAT BRADY RV PERCENT PACED: 99 %
Pulse Gen Model: 2210
Pulse Gen Serial Number: 7192434

## 2015-11-04 ENCOUNTER — Encounter: Payer: Self-pay | Admitting: Cardiology

## 2015-11-26 ENCOUNTER — Other Ambulatory Visit: Payer: Self-pay | Admitting: Internal Medicine

## 2015-12-09 ENCOUNTER — Other Ambulatory Visit: Payer: Self-pay | Admitting: Internal Medicine

## 2015-12-21 ENCOUNTER — Other Ambulatory Visit: Payer: Self-pay | Admitting: Internal Medicine

## 2015-12-22 ENCOUNTER — Other Ambulatory Visit: Payer: Self-pay | Admitting: Internal Medicine

## 2016-01-03 ENCOUNTER — Other Ambulatory Visit: Payer: Self-pay | Admitting: Internal Medicine

## 2016-01-21 ENCOUNTER — Ambulatory Visit (INDEPENDENT_AMBULATORY_CARE_PROVIDER_SITE_OTHER): Payer: Medicare Other | Admitting: *Deleted

## 2016-01-21 DIAGNOSIS — I442 Atrioventricular block, complete: Secondary | ICD-10-CM

## 2016-01-21 NOTE — Progress Notes (Signed)
Remote pacemaker transmission.   

## 2016-01-25 ENCOUNTER — Encounter: Payer: Self-pay | Admitting: Cardiology

## 2016-01-27 ENCOUNTER — Ambulatory Visit (INDEPENDENT_AMBULATORY_CARE_PROVIDER_SITE_OTHER): Payer: Medicare Other | Admitting: Internal Medicine

## 2016-01-27 ENCOUNTER — Other Ambulatory Visit (INDEPENDENT_AMBULATORY_CARE_PROVIDER_SITE_OTHER): Payer: Medicare Other

## 2016-01-27 ENCOUNTER — Encounter: Payer: Self-pay | Admitting: Internal Medicine

## 2016-01-27 VITALS — BP 142/78 | HR 84 | Temp 97.7°F | Ht 70.0 in | Wt 284.1 lb

## 2016-01-27 DIAGNOSIS — N183 Chronic kidney disease, stage 3 unspecified: Secondary | ICD-10-CM

## 2016-01-27 DIAGNOSIS — Z23 Encounter for immunization: Secondary | ICD-10-CM | POA: Diagnosis not present

## 2016-01-27 DIAGNOSIS — M10071 Idiopathic gout, right ankle and foot: Secondary | ICD-10-CM | POA: Diagnosis not present

## 2016-01-27 DIAGNOSIS — I10 Essential (primary) hypertension: Secondary | ICD-10-CM | POA: Diagnosis not present

## 2016-01-27 DIAGNOSIS — E785 Hyperlipidemia, unspecified: Secondary | ICD-10-CM

## 2016-01-27 DIAGNOSIS — Z0000 Encounter for general adult medical examination without abnormal findings: Secondary | ICD-10-CM

## 2016-01-27 DIAGNOSIS — E0821 Diabetes mellitus due to underlying condition with diabetic nephropathy: Secondary | ICD-10-CM

## 2016-01-27 LAB — COMPREHENSIVE METABOLIC PANEL
ALT: 19 U/L (ref 0–53)
AST: 24 U/L (ref 0–37)
Albumin: 3.9 g/dL (ref 3.5–5.2)
Alkaline Phosphatase: 100 U/L (ref 39–117)
BUN: 34 mg/dL — ABNORMAL HIGH (ref 6–23)
CO2: 29 mEq/L (ref 19–32)
Calcium: 9.2 mg/dL (ref 8.4–10.5)
Chloride: 103 mEq/L (ref 96–112)
Creatinine, Ser: 1.9 mg/dL — ABNORMAL HIGH (ref 0.40–1.50)
GFR: 44.19 mL/min — ABNORMAL LOW (ref >60.00)
Glucose, Bld: 129 mg/dL — ABNORMAL HIGH (ref 70–99)
Potassium: 4.7 mEq/L (ref 3.5–5.1)
SODIUM: 139 meq/L (ref 135–145)
Total Bilirubin: 0.8 mg/dL (ref 0.2–1.2)
Total Protein: 7.3 g/dL (ref 6.0–8.3)

## 2016-01-27 LAB — LIPID PANEL
Cholesterol: 167 mg/dL (ref 0–200)
HDL: 50.5 mg/dL (ref >39.00)
LDL Cholesterol: 98 mg/dL (ref 0–99)
NonHDL: 116.11
TRIGLYCERIDES: 92 mg/dL (ref 0.0–149.0)
Total CHOL/HDL Ratio: 3
VLDL: 18.4 mg/dL (ref 0.0–40.0)

## 2016-01-27 LAB — CBC WITH DIFFERENTIAL/PLATELET
Basophils Absolute: 0 10*3/uL (ref 0.0–0.1)
Basophils Relative: 0.3 % (ref 0.0–3.0)
EOS PCT: 3.7 % (ref 0.0–5.0)
Eosinophils Absolute: 0.3 10*3/uL (ref 0.0–0.7)
HCT: 47.9 % (ref 39.0–52.0)
HEMOGLOBIN: 16.1 g/dL (ref 13.0–17.0)
Lymphocytes Relative: 23.1 % (ref 12.0–46.0)
Lymphs Abs: 1.7 10*3/uL (ref 0.7–4.0)
MCHC: 33.6 g/dL (ref 30.0–36.0)
MCV: 91.7 fl (ref 78.0–100.0)
MONO ABS: 0.8 10*3/uL (ref 0.1–1.0)
MONOS PCT: 10.8 % (ref 3.0–12.0)
Neutro Abs: 4.6 10*3/uL (ref 1.4–7.7)
Neutrophils Relative %: 62.1 % (ref 43.0–77.0)
Platelets: 190 10*3/uL (ref 150.0–400.0)
RBC: 5.23 Mil/uL (ref 4.22–5.81)
RDW: 13.9 % (ref 11.5–15.5)
WBC: 7.3 10*3/uL (ref 4.0–10.5)

## 2016-01-27 LAB — URIC ACID: Uric Acid, Serum: 6.1 mg/dL (ref 4.0–7.8)

## 2016-01-27 LAB — HEMOGLOBIN A1C: HEMOGLOBIN A1C: 7.5 % — AB (ref 4.6–6.5)

## 2016-01-27 LAB — TSH: TSH: 1.55 u[IU]/mL (ref 0.35–4.50)

## 2016-01-27 NOTE — Progress Notes (Signed)
Subjective:  Patient ID: Connor Dixon, male    DOB: [birth year]  Age: 81 y.o. MRN: [id number]  CC: Annual Exam; Diabetes; Hyperlipidemia; and Hypertension   HPI Connor Dixon presents for a CPX.  He tells me his blood sugars have been relatively well controlled. He has a rare blood sugar spike up to 260 to 290 after he indulges on high calorie foods. He denies polyuria, polydipsia, polyphagia.  He tells me his blood pressures been well controlled on losartan, furosemide, and carvedilol. He has a stable amount of edema in both lower extremities around the ankles. He has had no recent episodes of chest pain, shortness of breath, palpitations, syncope, or fatigue.  Past Medical History:  Diagnosis Date  . Acute on chronic diastolic congestive heart failure ([place])   . Arthus phenomenon   . Atrioventricular block, complete ([place])   . Cardiac pacemaker in situ 07/22/2010  . Chronic kidney disease, stage 3    Presumably from longstanding DM & HTN. Scr has gradually increased over the past 2 years (1.6 in 12/2010, 2 in 03/2011, and up to 2.1 in 01/2013)  . DIABETES MELLITUS, TYPE I, ADULT ONSET 08/06/2007  . DM (diabetes mellitus) ([place])    adult onset   . Essential hypertension, benign 08/06/2007   Very good BP control  . History of second degree heart block   . HLD (hyperlipidemia)   . HYPERLIPIDEMIA 08/06/2007  . Hypopotassemia   . Morbid obesity ([place])   . Obesity   . Other and unspecified angina pectoris    typical  . Type I (juvenile type) diabetes mellitus without mention of complication, not stated as uncontrolled    (04/25/13 OV with Dr. Marval Regal) = Poorly controlled & is working  w/Dr. Crissie Sickles to improve this. Most recent Hgb A1c was 8.2% but Connor Dixon reports it was down to 7.6 last month.   Past Surgical History:  Procedure Laterality Date  . CATARACT EXTRACTION  8/08   [facility]  . PTVDP  12/11   PPM - [employer]. Jude  . TOTAL HIP ARTHROPLASTY  04/05/06   right    reports  that he has never smoked. He has never used smokeless tobacco. He reports that he does not drink alcohol or use drugs. family history includes Asthma in his father and mother; Coronary artery disease in his father and mother; Diabetes in his father; Heart attack (age of onset: 72) in his mother; Hyperlipidemia in his father; Stroke (age of onset: 110) in his father. Allergies  Allergen Reactions  . Oysters [Shellfish Allergy]     Blisters and swelling  . Apple Other (See Comments)    Sore throat    Outpatient Medications Prior to Visit  Medication Sig Dispense Refill  . allopurinol (ZYLOPRIM) 100 MG tablet Take 1 tablet (100 mg total) by mouth daily. 90 tablet 3  . amoxicillin (AMOXIL) 500 MG capsule Take 1 capsule (500 mg total) by mouth 3 (three) times daily. Take ONLY when having dental procedures 30 capsule 11  . apixaban (ELIQUIS) 5 MG TABS tablet Take 1 tablet (5 mg total) by mouth 2 (two) times daily. 60 tablet 11  . Blood Glucose Monitoring Suppl (FREESTYLE LITE) DEVI by Does not apply route 5 (five) times daily.    . Blood Glucose Monitoring Suppl (ONETOUCH ULTRALINK) W/DEVICE KIT by Does not apply route.    . carvedilol (COREG) 6.25 MG tablet Take 1 tablet (6.25 mg total) by mouth 2 (two) times daily. 60 tablet 9  .  furosemide (LASIX) 40 MG tablet TAKE 1 TABLET BY MOUTH EVERY MORNING AND 1/2 TABLET BY MOUTH AT 1 PM 135 tablet 1  . HUMULIN 70/30 (70-30) 100 UNIT/ML injection INJECT 65 UNITS AS DIRECTED 60 mL 5  . insulin glargine (LANTUS) 100 UNIT/ML injection Inject 25 Units into the skin at bedtime.    . insulin NPH-regular Human (NOVOLIN 70/30) (70-30) 100 UNIT/ML injection Inject 65 Units into the skin 3 (three) times daily with meals.    . Insulin Pen Needle (B-D ULTRAFINE III SHORT PEN) 31G X 8 MM MISC Inject 1 Act into the skin 3 (three) times daily. 100 each 11  . Insulin Syringe-Needle U-100 (INSULIN SYRINGE .5CC/31GX5/16") 31G X 5/16" 0.5 ML MISC USE FOUR TIMES DAILY AS  DIRECTED 100 each 11  . loratadine (CLARITIN) 10 MG tablet Take 10 mg by mouth daily as needed for allergies.     Marland Kitchen losartan (COZAAR) 50 MG tablet Take 0.5 tablets (25 mg total) by mouth daily. 45 tablet 3  . ONE TOUCH LANCETS MISC 1 Act by Does not apply route 3 (three) times daily. 200 each 11  . ONETOUCH VERIO test strip TEST 5 TIMES DAILY 500 each 3  . pravastatin (PRAVACHOL) 40 MG tablet Take 40 mg by mouth daily.    . pravastatin (PRAVACHOL) 40 MG tablet TAKE 1 TABLET EVERY DAY 90 tablet 0   No facility-administered medications prior to visit.     ROS Review of Systems  Constitutional: Negative.  Negative for activity change, chills, diaphoresis, fatigue and unexpected weight change.  HENT: Negative.  Negative for sinus pressure, sore throat and trouble swallowing.   Eyes: Negative.  Negative for visual disturbance.  Respiratory: Negative.  Negative for cough, choking, chest tightness, shortness of breath and stridor.   Cardiovascular: Positive for leg swelling. Negative for chest pain and palpitations.  Gastrointestinal: Negative.  Negative for abdominal pain, constipation, diarrhea, nausea and vomiting.  Endocrine: Negative.  Negative for polydipsia, polyphagia and polyuria.  Genitourinary: Negative.  Negative for difficulty urinating, dysuria, flank pain, frequency, hematuria, penile swelling, scrotal swelling, testicular pain and urgency.  Musculoskeletal: Negative for arthralgias, back pain, joint swelling, myalgias and neck pain.  Skin: Negative.  Negative for rash.  Allergic/Immunologic: Negative.   Neurological: Negative.   Hematological: Negative.  Negative for adenopathy. Does not bruise/bleed easily.  Psychiatric/Behavioral: Negative.     Objective:  BP (!) 142/78 (BP Location: Left Arm, Patient Position: Sitting, Cuff Size: Normal)   Pulse 84   Temp 97.7 F (36.5 C) (Oral)   Ht '5\' 10"'  (1.778 m)   Wt 284 lb 2 oz (128.9 kg)   SpO2 97%   BMI 40.77 kg/m   BP  Readings from Last 3 Encounters:  01/27/16 (!) 142/78  10/19/15 (!) 158/83  07/23/15 140/90    Wt Readings from Last 3 Encounters:  01/27/16 284 lb 2 oz (128.9 kg)  07/23/15 281 lb 1.9 oz (127.5 kg)  04/28/15 277 lb (125.6 kg)    Physical Exam  Constitutional: He is oriented to person, place, and time. No distress.  HENT:  Mouth/Throat: Oropharynx is clear and moist. No oropharyngeal exudate.  Eyes: Conjunctivae are normal. Right eye exhibits no discharge. Left eye exhibits no discharge. No scleral icterus.  Neck: Normal range of motion. Neck supple. No JVD present. No tracheal deviation present. No thyromegaly present.  Cardiovascular: Normal rate, regular rhythm, normal heart sounds and intact distal pulses.  Exam reveals no gallop and no friction rub.   No  murmur heard. Pulmonary/Chest: Effort normal and breath sounds normal. No stridor. No respiratory distress. He has no wheezes. He has no rales. He exhibits no tenderness.  Abdominal: Soft. Bowel sounds are normal. He exhibits no distension and no mass. There is no tenderness. There is no rebound and no guarding.  Genitourinary:  Genitourinary Comments: GU and rectal exams were deferred at his request.  Musculoskeletal: He exhibits edema. He exhibits no tenderness or deformity.  Trace pitting edema in both lower extremities  Lymphadenopathy:    He has no cervical adenopathy.  Neurological: He is oriented to person, place, and time.  Skin: Skin is warm and dry. No rash noted. He is not diaphoretic. No erythema. No pallor.  Psychiatric: He has a normal mood and affect. His behavior is normal. Judgment and thought content normal.  Vitals reviewed.   Lab Results  Component Value Date   WBC 7.3 01/27/2016   HGB 16.1 01/27/2016   HCT 47.9 01/27/2016   PLT 190.0 01/27/2016   GLUCOSE 129 (H) 01/27/2016   CHOL 167 01/27/2016   TRIG 92.0 01/27/2016   HDL 50.50 01/27/2016   LDLCALC 98 01/27/2016   ALT 19 01/27/2016   AST 24  01/27/2016   NA 139 01/27/2016   K 4.7 01/27/2016   CL 103 01/27/2016   CREATININE 1.90 (H) 01/27/2016   BUN 34 (H) 01/27/2016   CO2 29 01/27/2016   TSH 1.55 01/27/2016   PSA 1.77 01/06/2010   HGBA1C 7.5 (H) 01/27/2016   MICROALBUR 45.2 (H) 01/27/2016    No results found.  Assessment & Plan:   Connor Dixon was seen today for annual exam, diabetes, hyperlipidemia and hypertension.  Diagnoses and all orders for this visit:  Essential hypertension- his blood pressure is well-controlled, electrolytes are normal, he has had a slight decline in his renal function, he agrees to avoid nephrotoxic agents. -     CBC with Differential/Platelet; Future -     Urinalysis, Routine w reflex microscopic (not at [facility]); Future  Diabetes mellitus due to underlying condition with diabetic nephropathy, without long-term current use of insulin ([place])- his A1c is 7.5%, his blood sugars are still relatively well controlled, will continue the current regimen. -     Comprehensive metabolic panel; Future -     Hemoglobin A1c; Future -     Microalbumin / creatinine urine ratio; Future  Idiopathic gout of right foot, unspecified chronicity- his uric acid level is down to 6.1 which is at the therapeutic goal. -     Uric acid; Future  Morbid obesity due to excess calories ([place])- he agrees to decrease his caloric intake and increase his activity level to lose weight.  Chronic kidney disease, stage III (moderate)  Kidney disease, chronic, stage III (GFR 30-59 ml/min)- Estimated Creatinine Clearance: 42.5 mL/min (by C-G formula based on SCr of 1.9 mg/dL (H)). He has had a slight decline in his renal function, he will continue to avoid nephrotoxic agents. -     Comprehensive metabolic panel; Future  Hyperlipidemia with target LDL less than 70- he is not quite achieved his LDL goal the statin but he is also not willing to add any additional medications at this time. -     Lipid panel; Future -     TSH; Future  Need  for prophylactic vaccination and inoculation against influenza -     Flu vaccine HIGH DOSE PF   I am having Connor Dixon maintain his loratadine, FREESTYLE LITE, ONETOUCH ULTRALINK, ONE TOUCH LANCETS, amoxicillin,  ONETOUCH VERIO, allopurinol, carvedilol, insulin glargine, insulin NPH-regular Human, pravastatin, apixaban, losartan, HUMULIN 70/30, INSULIN SYRINGE .5CC/31GX5/16", Insulin Pen Needle, and furosemide.  No orders of the defined types were placed in this encounter.  See AVS for instructions about healthy living and anticipatory guidance.  Follow-up: Return in about 4 months (around 05/28/2016).  Scarlette Calico, MD

## 2016-01-27 NOTE — Patient Instructions (Signed)

## 2016-01-28 ENCOUNTER — Encounter: Payer: Self-pay | Admitting: Internal Medicine

## 2016-01-28 LAB — URINALYSIS, ROUTINE W REFLEX MICROSCOPIC
Bilirubin Urine: NEGATIVE
Ketones, ur: NEGATIVE
Leukocytes, UA: NEGATIVE
NITRITE: NEGATIVE
PH: 5.5 (ref 5.0–8.0)
Specific Gravity, Urine: 1.01 (ref 1.000–1.030)
TOTAL PROTEIN, URINE-UPE24: 30 — AB
Urine Glucose: NEGATIVE
Urobilinogen, UA: 0.2 (ref 0.0–1.0)

## 2016-01-28 LAB — MICROALBUMIN / CREATININE URINE RATIO
CREATININE, U: 37.5 mg/dL
MICROALB/CREAT RATIO: 120.7 mg/g — AB (ref 0.0–30.0)
Microalb, Ur: 45.2 mg/dL — ABNORMAL HIGH (ref 0.0–1.9)

## 2016-02-01 LAB — CUP PACEART REMOTE DEVICE CHECK
Battery Remaining Longevity: 81 mo
Battery Remaining Percentage: 65 %
Brady Statistic AP VP Percent: 2.1 %
Brady Statistic AP VS Percent: 1 %
Brady Statistic AS VP Percent: 97 %
Brady Statistic AS VS Percent: 1 %
Date Time Interrogation Session: 20170907090657
Implantable Lead Implant Date: 20111202
Implantable Lead Location: 753859
Implantable Lead Location: 753860
Lead Channel Impedance Value: 410 Ohm
Lead Channel Impedance Value: 460 Ohm
Lead Channel Pacing Threshold Amplitude: 1.125 V
Lead Channel Pacing Threshold Pulse Width: 0.4 ms
Lead Channel Pacing Threshold Pulse Width: 0.4 ms
Lead Channel Sensing Intrinsic Amplitude: 1.8 mV
Lead Channel Setting Pacing Amplitude: 1.375
Lead Channel Setting Pacing Amplitude: 2 V
Lead Channel Setting Pacing Pulse Width: 0.4 ms
MDC IDC LEAD IMPLANT DT: 20111202
MDC IDC MSMT BATTERY VOLTAGE: 2.9 V
MDC IDC MSMT LEADCHNL RA PACING THRESHOLD AMPLITUDE: 0.75 V
MDC IDC MSMT LEADCHNL RV SENSING INTR AMPL: 9 mV
MDC IDC PG SERIAL: 7192434
MDC IDC SET LEADCHNL RV SENSING SENSITIVITY: 4 mV
MDC IDC STAT BRADY RA PERCENT PACED: 1.5 %
MDC IDC STAT BRADY RV PERCENT PACED: 99 %

## 2016-02-02 ENCOUNTER — Other Ambulatory Visit: Payer: Self-pay | Admitting: Internal Medicine

## 2016-02-02 DIAGNOSIS — E1021 Type 1 diabetes mellitus with diabetic nephropathy: Secondary | ICD-10-CM

## 2016-02-02 DIAGNOSIS — E1121 Type 2 diabetes mellitus with diabetic nephropathy: Secondary | ICD-10-CM

## 2016-02-04 ENCOUNTER — Encounter: Payer: Self-pay | Admitting: Internal Medicine

## 2016-02-05 MED ORDER — INSULIN GLARGINE 100 UNIT/ML ~~LOC~~ SOLN: 25.0000 [IU] | Freq: Every day | SUBCUTANEOUS | 1 refills | Status: DC

## 2016-03-02 ENCOUNTER — Telehealth: Payer: Self-pay | Admitting: Emergency Medicine

## 2016-03-02 NOTE — Telephone Encounter (Signed)
It should be twice a day.

## 2016-03-02 NOTE — Telephone Encounter (Signed)
[facility] called and wants to know how many times he does his HUMULIN 70/30 (70-30) 100 UNIT/ML injection. He is requesting more syringes. Thanks.

## 2016-03-02 NOTE — Telephone Encounter (Signed)
Reviewed meds and OV notes but not able to determine. Please advise on the correct Humulin sig.

## 2016-03-03 MED ORDER — INSULIN ISOPHANE & REGULAR (HUMAN 70-30)100 UNIT/ML KWIKPEN: 65.0000 [IU] | PEN_INJECTOR | Freq: Two times a day (BID) | SUBCUTANEOUS | 11 refills | Status: DC

## 2016-03-03 NOTE — Addendum Note (Signed)
Addended by: Radford PaxAIRRIKIER DAVIDSON, Sidney Kann M on: 03/03/2016 03:11 PM   Modules accepted: Orders

## 2016-03-03 NOTE — Telephone Encounter (Signed)
Connor HaileyDustin, can you please try to call [facility]

## 2016-03-03 NOTE — Telephone Encounter (Signed)
Done. Pharmacist asked that we correct sig to reflect frequency

## 2016-03-05 ENCOUNTER — Other Ambulatory Visit: Payer: Self-pay | Admitting: Internal Medicine

## 2016-03-05 DIAGNOSIS — M1A3790 Chronic gout due to renal impairment, unspecified ankle and foot, without tophus (tophi): Secondary | ICD-10-CM

## 2016-03-06 ENCOUNTER — Other Ambulatory Visit: Payer: Self-pay | Admitting: Internal Medicine

## 2016-03-13 ENCOUNTER — Other Ambulatory Visit: Payer: Self-pay | Admitting: Internal Medicine

## 2016-03-14 ENCOUNTER — Telehealth: Payer: Self-pay | Admitting: *Deleted

## 2016-03-14 MED ORDER — INSULIN PEN NEEDLE 31G X 8 MM MISC: 11 refills | Status: DC

## 2016-03-14 MED ORDER — "INSULIN SYRINGE 31G X 5/16"" 0.5 ML MISC": 3 refills | Status: DC

## 2016-03-14 NOTE — Telephone Encounter (Signed)
Left msg on triage stating MD sent in rx for pt insulin pen needing pen needles , and also need rx for lantus. Need to have frequency on script so insurance will cover.Resent scripts....Raechel Chute/lmb

## 2016-03-15 MED ORDER — INSULIN ISOPHANE & REGULAR (HUMAN 70-30)100 UNIT/ML KWIKPEN: 65.0000 [IU] | PEN_INJECTOR | Freq: Two times a day (BID) | SUBCUTANEOUS | 11 refills | Status: DC

## 2016-03-15 NOTE — Addendum Note (Signed)
Addended by: Radford PaxAIRRIKIER DAVIDSON, Kaisei Gilbo M on: 03/15/2016 01:04 PM   Modules accepted: Orders

## 2016-04-15 ENCOUNTER — Other Ambulatory Visit: Payer: Self-pay | Admitting: Internal Medicine

## 2016-04-21 ENCOUNTER — Ambulatory Visit (INDEPENDENT_AMBULATORY_CARE_PROVIDER_SITE_OTHER): Payer: Medicare Other | Admitting: *Deleted

## 2016-04-21 DIAGNOSIS — I442 Atrioventricular block, complete: Secondary | ICD-10-CM

## 2016-04-21 NOTE — Progress Notes (Signed)
Remote pacemaker transmission.   

## 2016-04-26 ENCOUNTER — Other Ambulatory Visit: Payer: Self-pay | Admitting: Internal Medicine

## 2016-04-26 DIAGNOSIS — E0821 Diabetes mellitus due to underlying condition with diabetic nephropathy: Secondary | ICD-10-CM

## 2016-04-27 ENCOUNTER — Encounter: Payer: Self-pay | Admitting: Cardiology

## 2016-05-18 LAB — CUP PACEART REMOTE DEVICE CHECK
Battery Remaining Percentage: 57 %
Battery Voltage: 2.89 V
Brady Statistic AP VP Percent: 1.7 %
Brady Statistic AP VS Percent: 1 %
Brady Statistic AS VS Percent: 1 %
Brady Statistic RV Percent Paced: 99 %
Date Time Interrogation Session: 20171207133050
Implantable Lead Implant Date: 20111202
Implantable Lead Location: 753860
Implantable Pulse Generator Implant Date: 20111202
Lead Channel Impedance Value: 430 Ohm
Lead Channel Pacing Threshold Amplitude: 1.25 V
Lead Channel Pacing Threshold Pulse Width: 0.4 ms
Lead Channel Sensing Intrinsic Amplitude: 10.4 mV
Lead Channel Setting Pacing Amplitude: 1.5 V
Lead Channel Setting Pacing Amplitude: 2 V
Lead Channel Setting Pacing Pulse Width: 0.4 ms
Lead Channel Setting Sensing Sensitivity: 4 mV
MDC IDC LEAD IMPLANT DT: 20111202
MDC IDC LEAD LOCATION: 753859
MDC IDC MSMT BATTERY REMAINING LONGEVITY: 70 mo
MDC IDC MSMT LEADCHNL RA IMPEDANCE VALUE: 450 Ohm
MDC IDC MSMT LEADCHNL RA PACING THRESHOLD AMPLITUDE: 0.75 V
MDC IDC MSMT LEADCHNL RA PACING THRESHOLD PULSEWIDTH: 0.4 ms
MDC IDC MSMT LEADCHNL RA SENSING INTR AMPL: 2.3 mV
MDC IDC STAT BRADY AS VP PERCENT: 97 %
MDC IDC STAT BRADY RA PERCENT PACED: 1.2 %
Pulse Gen Model: 2210
Pulse Gen Serial Number: 7192434

## 2016-06-19 ENCOUNTER — Other Ambulatory Visit: Payer: Self-pay | Admitting: Internal Medicine

## 2016-06-28 ENCOUNTER — Other Ambulatory Visit: Payer: Self-pay | Admitting: Internal Medicine

## 2016-07-04 ENCOUNTER — Other Ambulatory Visit: Payer: Self-pay | Admitting: Internal Medicine

## 2016-07-15 LAB — HM DIABETES EYE EXAM

## 2016-07-20 ENCOUNTER — Encounter: Payer: Self-pay | Admitting: Internal Medicine

## 2016-07-20 NOTE — Progress Notes (Unsigned)
Results entered and sent to scan  

## 2016-07-26 ENCOUNTER — Encounter: Payer: Self-pay | Admitting: Internal Medicine

## 2016-08-02 ENCOUNTER — Ambulatory Visit (INDEPENDENT_AMBULATORY_CARE_PROVIDER_SITE_OTHER): Payer: Medicare Other | Admitting: Internal Medicine

## 2016-08-02 ENCOUNTER — Encounter: Payer: Self-pay | Admitting: Internal Medicine

## 2016-08-02 VITALS — BP 148/100 | HR 87 | Ht 68.0 in | Wt 284.0 lb

## 2016-08-02 DIAGNOSIS — I442 Atrioventricular block, complete: Secondary | ICD-10-CM | POA: Diagnosis not present

## 2016-08-02 LAB — CUP PACEART INCLINIC DEVICE CHECK
Battery Voltage: 2.87 V
Brady Statistic RA Percent Paced: 0.99 %
Brady Statistic RV Percent Paced: 99.18 %
Date Time Interrogation Session: 20180320111645
Implantable Lead Implant Date: 20111202
Implantable Lead Implant Date: 20111202
Implantable Lead Location: 753859
Implantable Lead Location: 753860
Implantable Pulse Generator Implant Date: 20111202
Lead Channel Impedance Value: 400 Ohm
Lead Channel Impedance Value: 437.5 Ohm
Lead Channel Pacing Threshold Amplitude: 0.75 V
Lead Channel Pacing Threshold Amplitude: 1.25 V
Lead Channel Pacing Threshold Amplitude: 1.25 V
Lead Channel Pacing Threshold Pulse Width: 0.4 ms
Lead Channel Pacing Threshold Pulse Width: 0.4 ms
Lead Channel Pacing Threshold Pulse Width: 0.4 ms
Lead Channel Pacing Threshold Pulse Width: 0.4 ms
Lead Channel Sensing Intrinsic Amplitude: 9 mV
Lead Channel Setting Pacing Amplitude: 2 V
Lead Channel Setting Pacing Amplitude: 2.5 V
Lead Channel Setting Sensing Sensitivity: 4 mV
MDC IDC MSMT LEADCHNL RA PACING THRESHOLD AMPLITUDE: 0.75 V
MDC IDC MSMT LEADCHNL RA SENSING INTR AMPL: 2.2 mV
MDC IDC SET LEADCHNL RV PACING PULSEWIDTH: 0.4 ms
Pulse Gen Model: 2210
Pulse Gen Serial Number: 7192434

## 2016-08-02 MED ORDER — FUROSEMIDE 40 MG PO TABS: 40.0000 mg | ORAL_TABLET | Freq: Two times a day (BID) | ORAL | 3 refills | Status: DC

## 2016-08-02 NOTE — Progress Notes (Signed)
HPI Connor Dixon he is a very pleasant morbidly obese 82 year old man with a history of complete heart block, status post permanent pacemaker insertion. He also has a history of diabetes, hypertension, CRI, and peripheral edema. He remains active, working in his yard. No syncope. No chest pain. He denies palpitations. He c/o sob. He had an echo a year ago which showed his EF was normal but he has mild/mod pulmonary HTN. Allergies  Allergen Reactions  . Oysters [Shellfish Allergy]     Blisters and swelling  . Apple Other (See Comments)    Sore throat     Current Outpatient Prescriptions  Medication Sig Dispense Refill  . allopurinol (ZYLOPRIM) 100 MG tablet TAKE 1 TABLET(100 MG) BY MOUTH DAILY 90 tablet 1  . amoxicillin (AMOXIL) 500 MG capsule TAKE ONE CAPSULE BY MOUTH THREE TIMES DAILY. TAKE ONLY WHEN HAVING DENTAL PROCEDURES 30 capsule 2  . Blood Glucose Monitoring Suppl (FREESTYLE LITE) DEVI by Does not apply route 5 (five) times daily.    . Blood Glucose Monitoring Suppl (ONETOUCH ULTRALINK) W/DEVICE KIT by Does not apply route.    . carvedilol (COREG) 6.25 MG tablet TAKE 1 TABLET(6.25 MG) BY MOUTH TWICE DAILY 60 tablet 11  . ELIQUIS 5 MG TABS tablet TAKE 1 TABLET(5 MG) BY MOUTH TWICE DAILY 60 tablet 5  . furosemide (LASIX) 40 MG tablet TAKE 1 TABLET BY MOUTH EVERY MORNING AND 1/2 TABLET BY MOUTH AT 1 PM 135 tablet 0  . Insulin Isophane & Regular Human (HUMULIN 70/30 MIX) (70-30) 100 UNIT/ML PEN Inject 65 Units into the skin 2 (two) times daily. 15 mL 11  . Insulin Pen Needle (B-D ULTRAFINE III SHORT PEN) 31G X 8 MM MISC Use to administer insulin three times a day. Dx E11.9 100 each 11  . Insulin Syringe-Needle U-100 (INSULIN SYRINGE .5CC/31GX5/16") 31G X 5/16" 0.5 ML MISC Use to administer Lantus insulin at bedtime. Dx e11.9 90 each 3  . LANTUS SOLOSTAR 100 UNIT/ML Solostar Pen ADMINISTER 25 UNITS UNDER THE SKIN DAILY AT 10 PM 15 mL 5  . loratadine (CLARITIN) 10 MG tablet Take 10 mg by  mouth daily as needed for allergies.     Marland Kitchen losartan (COZAAR) 50 MG tablet Take 0.5 tablets (25 mg total) by mouth daily. 45 tablet 3  . ONE TOUCH LANCETS MISC 1 Act by Does not apply route 3 (three) times daily. 200 each 11  . ONETOUCH VERIO test strip TEST 5 TIMES DAILY 500 each 3  . pravastatin (PRAVACHOL) 40 MG tablet Take 40 mg by mouth daily.     No current facility-administered medications for this visit.      Past Medical History:  Diagnosis Date  . Acute on chronic diastolic congestive heart failure ([place])   . Arthus phenomenon   . Atrioventricular block, complete ([place])   . Cardiac pacemaker in situ 07/22/2010  . Chronic kidney disease, stage 3    Presumably from longstanding DM & HTN. Scr has gradually increased over the past 2 years (1.6 in 12/2010, 2 in 03/2011, and up to 2.1 in 01/2013)  . DIABETES MELLITUS, TYPE I, ADULT ONSET 08/06/2007  . DM (diabetes mellitus) ([place])    adult onset   . Essential hypertension, benign 08/06/2007   Very good BP control  . History of second degree heart block   . HLD (hyperlipidemia)   . HYPERLIPIDEMIA 08/06/2007  . Hypopotassemia   . Morbid obesity ([place])   . Obesity   . Other and unspecified angina pectoris  typical  . Type I (juvenile type) diabetes mellitus without mention of complication, not stated as uncontrolled    (04/25/13 OV with Dr. Marval Regal) = Poorly controlled & is working  w/Dr. Crissie Sickles to improve this. Most recent Hgb A1c was 8.2% but Connor Dixon reports it was down to 7.6 last month.    ROS:   All systems reviewed and negative except as noted in the HPI.   Past Surgical History:  Procedure Laterality Date  . CATARACT EXTRACTION  8/08   [facility]  . PTVDP  12/11   PPM - [employer]. Jude  . TOTAL HIP ARTHROPLASTY  04/05/06   right     Family History  Problem Relation Age of Onset  . Asthma Father   . Coronary artery disease Father   . Diabetes Father   . Hyperlipidemia Father   . Stroke Father 54  . Coronary  artery disease Mother   . Asthma Mother   . Heart attack Mother 56  . Colon cancer Neg Hx     no definate prostate CA      Social History   Social History  . Marital status: Married    Spouse name: N/A  . Number of children: N/A  . Years of education: N/A   Occupational History  . Not on file.   Social History Main Topics  . Smoking status: Never Smoker  . Smokeless tobacco: Never Used  . Alcohol use No  . Drug use: No  . Sexual activity: Not Currently   Other Topics Concern  . Not on file   Social History Narrative   [employer] BA, 2 Master's degrees. [place] - for certification Education specialist.    Retired Tree surgeon.   Active - travels.   Married - 1961; 2 sons ([place]) and 5 grandchildren.        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      BP (!) 148/100   Pulse 87   Ht '5\' 8"'  (1.727 m)   Wt 284 lb (128.8 kg)   SpO2 95%   BMI 43.18 kg/m   Physical Exam:  Well appearing obese 82 year old man, NAD HEENT: Unremarkable Neck:  7 cm JVD, no thyromegally Lungs:  Clear with no wheezes, rales, or rhonchi. HEART:  Regular rate rhythm, no murmurs, no rubs, no clicks Abd:  soft, positive bowel sounds, no organomegally, no rebound, no guarding Ext:  2 plus pulses, no edema, no cyanosis, no clubbing Skin:  No rashes no nodules Neuro:  CN II through XII intact, motor grossly intact   DEVICE  Normal device function.  See PaceArt for details.   Assess/Plan:  1. PAF - The patient has been asymptomatic but he has had upto 20 minutes on PPM interogation. His father and brother have had strokes.He will continue his systemic anti-coagulation 2. Complete heart block - he is asymptomatic s/p PPM insertion. 3. PPM - his St. Jude DDD PM is working normally. Will recheck in several months. 4. Obesity - I have discussed the importance of weight loss. He will try to do better. 5. Dyspnea - he is encouraged to lose weight. He will increase his  dose of lasix to 40 mg bid. Will check labs in a week or two.  Mikle Bosworth.D.

## 2016-08-02 NOTE — Patient Instructions (Addendum)
Medication Instructions:  INCREASE furosemide (lasix) to 40 mg twice a day.   Labwork: Your physician recommends that you return for lab work in: 10 days for BMET.   Testing/Procedures: None ordered  Follow-Up: Remote monitoring is used to monitor your Pacemaker from home. This monitoring reduces the number of office visits required to check your device to one time per year. It allows us to keep an eye on the functioning of your device to ensure it is working properly. You are scheduled for a device check from home on 11/01/16. You may send your transmission at any time that day. If you have a wireless device, the transmission will be sent automatically. After your physician reviews your transmission, you will receive a postcard with your next transmission date.  Your physician wants you to follow-up in: 6 months with Dr. Ladona Ridgelaylor. You will receive a reminder letter in the mail two months in advance. If you don't receive a letter, please call our office to schedule the follow-up appointment.    Any Other Special Instructions Will Be Listed Below (If Applicable).     If you need a refill on your cardiac medications before your next appointment, please call your pharmacy.

## 2016-08-12 ENCOUNTER — Other Ambulatory Visit: Payer: Medicare Other | Admitting: *Deleted

## 2016-08-12 DIAGNOSIS — I442 Atrioventricular block, complete: Secondary | ICD-10-CM

## 2016-08-12 LAB — BASIC METABOLIC PANEL
BUN/Creatinine Ratio: 18 (ref 10–24)
BUN: 32 mg/dL — AB (ref 8–27)
CO2: 22 mmol/L (ref 18–29)
CREATININE: 1.8 mg/dL — AB (ref 0.76–1.27)
Calcium: 8.9 mg/dL (ref 8.6–10.2)
Chloride: 99 mmol/L (ref 96–106)
GFR calc Af Amer: 40 mL/min/{1.73_m2} — ABNORMAL LOW (ref >59)
GFR calc non Af Amer: 35 mL/min/{1.73_m2} — ABNORMAL LOW (ref >59)
Glucose: 106 mg/dL — ABNORMAL HIGH (ref 65–99)
Potassium: 4.5 mmol/L (ref 3.5–5.2)
Sodium: 140 mmol/L (ref 134–144)

## 2016-08-26 ENCOUNTER — Other Ambulatory Visit: Payer: Self-pay | Admitting: Internal Medicine

## 2016-08-26 NOTE — Telephone Encounter (Signed)
Pt needs to schedule an appt

## 2016-09-01 ENCOUNTER — Other Ambulatory Visit: Payer: Self-pay | Admitting: Internal Medicine

## 2016-09-01 DIAGNOSIS — M1A3790 Chronic gout due to renal impairment, unspecified ankle and foot, without tophus (tophi): Secondary | ICD-10-CM

## 2016-09-01 LAB — BASIC METABOLIC PANEL
BUN: 38 mg/dL — AB (ref 4–21)
Creatinine: 2.1 mg/dL — AB (ref 0.6–1.3)
GLUCOSE: 173 mg/dL
Potassium: 4.6 mmol/L (ref 3.4–5.3)
Sodium: 137 mmol/L (ref 137–147)

## 2016-09-01 LAB — HEPATIC FUNCTION PANEL
ALT: 23 U/L (ref 10–40)
AST: 27 U/L (ref 14–40)
Alkaline Phosphatase: 125 U/L (ref 25–125)
BILIRUBIN, TOTAL: 0.7 mg/dL

## 2016-09-01 LAB — CBC AND DIFFERENTIAL
HEMATOCRIT: 46 % (ref 41–53)
HEMOGLOBIN: 15.8 g/dL (ref 13.5–17.5)
PLATELETS: 175 10*3/uL (ref 150–399)
WBC: 5.8 10*3/mL

## 2016-09-22 LAB — BASIC METABOLIC PANEL
BUN: 48 mg/dL — AB (ref 4–21)
CREATININE: 1.9 mg/dL — AB (ref 0.6–1.3)
Glucose: 189 mg/dL
POTASSIUM: 4.7 mmol/L (ref 3.4–5.3)
Sodium: 135 mmol/L — AB (ref 137–147)

## 2016-10-04 ENCOUNTER — Encounter: Payer: Self-pay | Admitting: Internal Medicine

## 2016-10-05 ENCOUNTER — Other Ambulatory Visit: Payer: Self-pay | Admitting: Internal Medicine

## 2016-10-05 DIAGNOSIS — E1021 Type 1 diabetes mellitus with diabetic nephropathy: Secondary | ICD-10-CM

## 2016-10-05 DIAGNOSIS — E1121 Type 2 diabetes mellitus with diabetic nephropathy: Secondary | ICD-10-CM

## 2016-11-01 ENCOUNTER — Ambulatory Visit (INDEPENDENT_AMBULATORY_CARE_PROVIDER_SITE_OTHER): Payer: Medicare Other | Admitting: *Deleted

## 2016-11-01 DIAGNOSIS — I442 Atrioventricular block, complete: Secondary | ICD-10-CM | POA: Diagnosis not present

## 2016-11-01 NOTE — Progress Notes (Signed)
Remote pacemaker transmission.   

## 2016-11-02 ENCOUNTER — Telehealth: Payer: Self-pay | Admitting: Interventional Cardiology

## 2016-11-02 LAB — CUP PACEART REMOTE DEVICE CHECK
Battery Remaining Longevity: 50 mo
Battery Remaining Percentage: 46 %
Battery Voltage: 2.86 V
Brady Statistic AP VP Percent: 1 %
Brady Statistic AS VP Percent: 99 %
Brady Statistic RA Percent Paced: 1 %
Implantable Lead Implant Date: 20111202
Implantable Lead Implant Date: 20111202
Implantable Lead Location: 753859
Implantable Pulse Generator Implant Date: 20111202
Lead Channel Impedance Value: 440 Ohm
Lead Channel Pacing Threshold Amplitude: 0.75 V
Lead Channel Pacing Threshold Pulse Width: 0.4 ms
Lead Channel Sensing Intrinsic Amplitude: 1.6 mV
Lead Channel Sensing Intrinsic Amplitude: 9.6 mV
Lead Channel Setting Pacing Pulse Width: 0.4 ms
Lead Channel Setting Sensing Sensitivity: 4 mV
MDC IDC LEAD LOCATION: 753860
MDC IDC MSMT LEADCHNL RV IMPEDANCE VALUE: 390 Ohm
MDC IDC MSMT LEADCHNL RV PACING THRESHOLD AMPLITUDE: 1.25 V
MDC IDC MSMT LEADCHNL RV PACING THRESHOLD PULSEWIDTH: 0.4 ms
MDC IDC PG SERIAL: 7192434
MDC IDC SESS DTM: 20180619064732
MDC IDC SET LEADCHNL RA PACING AMPLITUDE: 2 V
MDC IDC SET LEADCHNL RV PACING AMPLITUDE: 2.5 V
MDC IDC STAT BRADY AP VS PERCENT: 1 %
MDC IDC STAT BRADY AS VS PERCENT: 1 %
MDC IDC STAT BRADY RV PERCENT PACED: 99 %

## 2016-11-02 NOTE — Telephone Encounter (Signed)
Walk In Pt Form-Disability Parking Pla-Card paper dropped off placed in [employer] Doc Box.

## 2016-11-04 ENCOUNTER — Encounter: Payer: Self-pay | Admitting: Cardiology

## 2016-11-04 ENCOUNTER — Telehealth: Payer: Self-pay | Admitting: Internal Medicine

## 2016-11-04 MED ORDER — BLOOD GLUCOSE MONITOR KIT: PACK | 0 refills | Status: DC

## 2016-11-04 NOTE — Telephone Encounter (Signed)
MYCHART MESSAGE.    Dr Yetta BarreJones will you please send a prescription for my ONE TOUCH VERIO test meter . Test results has been inconsistent just minutes apart. [facility] [address] [place] THANKS

## 2016-11-04 NOTE — Telephone Encounter (Signed)
erx sent

## 2016-11-07 ENCOUNTER — Encounter: Payer: Self-pay | Admitting: Internal Medicine

## 2016-11-08 ENCOUNTER — Other Ambulatory Visit: Payer: Self-pay | Admitting: Internal Medicine

## 2016-11-08 ENCOUNTER — Telehealth: Payer: Self-pay

## 2016-11-08 MED ORDER — ONETOUCH VERIO W/DEVICE KIT: 1.0000 | PACK | Freq: Every day | 0 refills | Status: DC

## 2016-11-08 NOTE — Telephone Encounter (Signed)
Called, spoke with pt. Informed Dr. Ladona Ridgelaylor completed the disability placard document. It is ready to be picked up. Pt stated he will stop by today to pick up. Pt verbalized understanding.

## 2016-11-27 ENCOUNTER — Other Ambulatory Visit: Payer: Self-pay | Admitting: Internal Medicine

## 2016-11-29 ENCOUNTER — Other Ambulatory Visit: Payer: Self-pay | Admitting: Internal Medicine

## 2016-11-29 DIAGNOSIS — M1A3790 Chronic gout due to renal impairment, unspecified ankle and foot, without tophus (tophi): Secondary | ICD-10-CM

## 2016-12-03 ENCOUNTER — Other Ambulatory Visit: Payer: Self-pay | Admitting: Internal Medicine

## 2016-12-05 ENCOUNTER — Other Ambulatory Visit: Payer: Self-pay | Admitting: *Deleted

## 2016-12-05 MED ORDER — APIXABAN 5 MG PO TABS: ORAL_TABLET | ORAL | 1 refills | Status: DC

## 2016-12-22 LAB — BASIC METABOLIC PANEL
BUN: 36 — AB (ref 4–21)
Creatinine: 1.8 — AB (ref 0.6–1.3)
GLUCOSE: 147
Potassium: 3.9 (ref 3.4–5.3)
Sodium: 140 (ref 137–147)

## 2016-12-22 LAB — HEPATIC FUNCTION PANEL
ALT: 19 (ref 10–40)
AST: 31 (ref 14–40)
Alkaline Phosphatase: 108 (ref 25–125)
Bilirubin, Total: 0.6

## 2016-12-25 ENCOUNTER — Other Ambulatory Visit: Payer: Self-pay | Admitting: Internal Medicine

## 2016-12-29 ENCOUNTER — Encounter: Payer: Self-pay | Admitting: Internal Medicine

## 2017-01-24 ENCOUNTER — Other Ambulatory Visit: Payer: Self-pay | Admitting: Internal Medicine

## 2017-01-30 ENCOUNTER — Other Ambulatory Visit: Payer: Self-pay | Admitting: Internal Medicine

## 2017-01-31 ENCOUNTER — Encounter: Payer: Self-pay | Admitting: Internal Medicine

## 2017-01-31 ENCOUNTER — Ambulatory Visit (INDEPENDENT_AMBULATORY_CARE_PROVIDER_SITE_OTHER): Payer: Medicare Other | Admitting: Internal Medicine

## 2017-01-31 ENCOUNTER — Other Ambulatory Visit (INDEPENDENT_AMBULATORY_CARE_PROVIDER_SITE_OTHER): Payer: Medicare Other

## 2017-01-31 ENCOUNTER — Ambulatory Visit (INDEPENDENT_AMBULATORY_CARE_PROVIDER_SITE_OTHER): Payer: Medicare Other | Admitting: *Deleted

## 2017-01-31 VITALS — BP 140/80 | HR 85 | Temp 98.4°F | Ht 68.0 in | Wt 293.0 lb

## 2017-01-31 DIAGNOSIS — N183 Chronic kidney disease, stage 3 unspecified: Secondary | ICD-10-CM

## 2017-01-31 DIAGNOSIS — Z23 Encounter for immunization: Secondary | ICD-10-CM

## 2017-01-31 DIAGNOSIS — E785 Hyperlipidemia, unspecified: Secondary | ICD-10-CM | POA: Diagnosis not present

## 2017-01-31 DIAGNOSIS — I10 Essential (primary) hypertension: Secondary | ICD-10-CM | POA: Diagnosis not present

## 2017-01-31 DIAGNOSIS — E1140 Type 2 diabetes mellitus with diabetic neuropathy, unspecified: Secondary | ICD-10-CM

## 2017-01-31 DIAGNOSIS — I442 Atrioventricular block, complete: Secondary | ICD-10-CM | POA: Diagnosis not present

## 2017-01-31 LAB — URINALYSIS, ROUTINE W REFLEX MICROSCOPIC
Bilirubin Urine: NEGATIVE
Ketones, ur: NEGATIVE
Leukocytes, UA: NEGATIVE
NITRITE: NEGATIVE
PH: 6 (ref 5.0–8.0)
Specific Gravity, Urine: 1.015 (ref 1.000–1.030)
Total Protein, Urine: 30 — AB
URINE GLUCOSE: NEGATIVE
Urobilinogen, UA: 0.2 (ref 0.0–1.0)
WBC, UA: NONE SEEN (ref 0–?)

## 2017-01-31 LAB — LIPID PANEL
CHOL/HDL RATIO: 3
Cholesterol: 153 mg/dL (ref 0–200)
HDL: 55.8 mg/dL (ref >39.00)
LDL CALC: 76 mg/dL (ref 0–99)
NonHDL: 96.79
Triglycerides: 103 mg/dL (ref 0.0–149.0)
VLDL: 20.6 mg/dL (ref 0.0–40.0)

## 2017-01-31 LAB — HEMOGLOBIN A1C: Hgb A1c MFr Bld: 7.8 % — ABNORMAL HIGH (ref 4.6–6.5)

## 2017-01-31 LAB — MICROALBUMIN / CREATININE URINE RATIO
Creatinine,U: 51.8 mg/dL
MICROALB/CREAT RATIO: 50.8 mg/g — AB (ref 0.0–30.0)
Microalb, Ur: 26.3 mg/dL — ABNORMAL HIGH (ref 0.0–1.9)

## 2017-01-31 NOTE — Patient Instructions (Signed)

## 2017-01-31 NOTE — Progress Notes (Signed)
Subjective:  Patient ID: Connor Dixon, male    DOB: 30-Jan-1935  Age: 82 y.o. MRN: [id number]  CC: Hypertension and Diabetes   HPI NORMON PETTIJOHN presents for f/up - he complains of weight gain and poorly controlled blood sugars.  Outpatient Medications Prior to Visit  Medication Sig Dispense Refill  . allopurinol (ZYLOPRIM) 100 MG tablet TAKE 1 TABLET(100 MG) BY MOUTH DAILY 90 tablet 0  . apixaban (ELIQUIS) 5 MG TABS tablet TAKE 1 TABLET(5 MG) BY MOUTH TWICE DAILY 60 tablet 1  . blood glucose meter kit and supplies KIT Dispense what is covered by patients insurance. 1 each 0  . Blood Glucose Monitoring Suppl (ONETOUCH VERIO) w/Device KIT 1 Device by Does not apply route daily. Use as directed 1 kit 0  . carvedilol (COREG) 6.25 MG tablet TAKE 1 TABLET(6.25 MG) BY MOUTH TWICE DAILY 60 tablet 11  . Insulin Isophane & Regular Human (HUMULIN 70/30 MIX) (70-30) 100 UNIT/ML PEN Inject 65 Units into the skin 2 (two) times daily. 15 mL 11  . insulin NPH-regular Human (HUMULIN 70/30) (70-30) 100 UNIT/ML injection Inject 65 Units into the skin daily with breakfast. Must keep scheduled appt for future refills 10 mL 0  . Insulin Pen Needle (B-D ULTRAFINE III SHORT PEN) 31G X 8 MM MISC Use to administer insulin three times a day. Dx E11.9 100 each 11  . Insulin Syringe-Needle U-100 (INSULIN SYRINGE .5CC/31GX5/16") 31G X 5/16" 0.5 ML MISC USE AS DIRECTED FOUR TIMES DAILY 200 each 1  . LANTUS 100 UNIT/ML injection INJECT 25 UNITS INTO THE SKIN AT BEDTIME 30 mL 3  . LANTUS SOLOSTAR 100 UNIT/ML Solostar Pen ADMINISTER 25 UNITS UNDER THE SKIN DAILY AT 10 PM 15 mL 5  . loratadine (CLARITIN) 10 MG tablet Take 10 mg by mouth daily as needed for allergies.     Marland Kitchen losartan (COZAAR) 50 MG tablet TAKE 1/2 TABLET BY MOUTH EVERY DAY. APPOINTMENT NEEDED FOR REFILLS 45 tablet 1  . ONETOUCH DELICA LANCETS 56L MISC TEST THREE TIMES DAILY 200 each 11  . ONETOUCH VERIO test strip TEST 5 TIMES DAILY 500 each 3  .  pravastatin (PRAVACHOL) 40 MG tablet Take 40 mg by mouth daily.    . pravastatin (PRAVACHOL) 40 MG tablet TAKE 1 TABLET EVERY DAY 90 tablet 1  . amoxicillin (AMOXIL) 500 MG capsule TAKE ONE CAPSULE BY MOUTH THREE TIMES DAILY. TAKE ONLY WHEN HAVING DENTAL PROCEDURES (Patient not taking: Reported on 01/31/2017) 30 capsule 2  . furosemide (LASIX) 40 MG tablet Take 1 tablet (40 mg total) by mouth 2 (two) times daily. 180 tablet 3   No facility-administered medications prior to visit.     ROS Review of Systems  Constitutional: Positive for unexpected weight change. Negative for appetite change, chills, diaphoresis and fatigue.  HENT: Negative.   Eyes: Negative.   Respiratory: Positive for shortness of breath (chronic, unchanged SOB). Negative for choking, chest tightness and wheezing.   Cardiovascular: Negative.  Negative for chest pain, palpitations and leg swelling.  Gastrointestinal: Negative for abdominal pain, constipation, diarrhea, nausea and vomiting.  Endocrine: Negative.  Negative for polydipsia, polyphagia and polyuria.  Genitourinary: Negative.  Negative for difficulty urinating and dysuria.  Musculoskeletal: Negative.   Skin: Negative.   Allergic/Immunologic: Negative.   Neurological: Negative.  Negative for dizziness and weakness.  Hematological: Negative for adenopathy. Does not bruise/bleed easily.  Psychiatric/Behavioral: Negative.     Objective:  BP 140/80 (BP Location: Left Arm, Patient Position: Sitting, Cuff Size:  Large)   Pulse 85   Temp 98.4 F (36.9 C) (Oral)   Ht '5\' 8"'  (1.727 m)   Wt 293 lb (132.9 kg)   SpO2 99%   BMI 44.55 kg/m   BP Readings from Last 3 Encounters:  01/31/17 140/80  08/02/16 (!) 148/100  01/27/16 (!) 142/78    Wt Readings from Last 3 Encounters:  01/31/17 293 lb (132.9 kg)  08/02/16 284 lb (128.8 kg)  01/27/16 284 lb 2 oz (128.9 kg)    Physical Exam  Constitutional: He is oriented to person, place, and time. No distress.  HENT:    Mouth/Throat: Oropharynx is clear and moist. No oropharyngeal exudate.  Eyes: Conjunctivae are normal. Right eye exhibits no discharge. Left eye exhibits no discharge. No scleral icterus.  Neck: Normal range of motion. Neck supple. No JVD present. No thyromegaly present.  Cardiovascular: Normal rate, regular rhythm and intact distal pulses.  Exam reveals no gallop and no friction rub.   Murmur heard.  Systolic murmur is present with a grade of 1/6   No diastolic murmur is present  1/6 SEM RUSB  Pulmonary/Chest: Effort normal and breath sounds normal. No respiratory distress. He has no wheezes. He has no rales. He exhibits no tenderness.  Abdominal: Soft. Bowel sounds are normal. He exhibits no distension and no mass. There is no tenderness. There is no rebound and no guarding.  Musculoskeletal: Normal range of motion. He exhibits no edema, tenderness or deformity.  Lymphadenopathy:    He has no cervical adenopathy.  Neurological: He is alert and oriented to person, place, and time.  Skin: Skin is warm and dry. No rash noted. He is not diaphoretic. No erythema. No pallor.  Vitals reviewed.   Lab Results  Component Value Date   WBC 5.8 09/01/2016   HGB 15.8 09/01/2016   HCT 46 09/01/2016   PLT 175 09/01/2016   GLUCOSE 106 (H) 08/12/2016   CHOL 153 01/31/2017   TRIG 103.0 01/31/2017   HDL 55.80 01/31/2017   LDLCALC 76 01/31/2017   ALT 19 12/22/2016   AST 31 12/22/2016   NA 140 12/22/2016   K 3.9 12/22/2016   CL 99 08/12/2016   CREATININE 1.8 (A) 12/22/2016   BUN 36 (A) 12/22/2016   CO2 22 08/12/2016   TSH 1.55 01/27/2016   PSA 1.77 01/06/2010   HGBA1C 7.8 (H) 01/31/2017   MICROALBUR 26.3 (H) 01/31/2017    No results found.  Assessment & Plan:   Crockett was seen today for hypertension and diabetes.  Diagnoses and all orders for this visit:  Essential hypertension- His BP adequately well controlled -     Urinalysis, Routine w reflex microscopic; Future  Type 2  diabetes mellitus with diabetic neuropathy, unspecified whether long term insulin use ([place])- his A1C is up to 7.8%, he will improve his lifestyle modifications -     Hemoglobin A1c; Future -     Microalbumin / creatinine urine ratio; Future  Kidney disease, chronic, stage III (GFR 30-59 ml/min) -     Urinalysis, Routine w reflex microscopic; Future  Morbid obesity ([place])- he agrees to lose wt with lifestyle modifications  Hyperlipidemia with target LDL less than 70- he has achieved his LDL goal and is doing well on the statin -     Lipid panel; Future  Need for influenza vaccination -     Flu vaccine HIGH DOSE PF (Fluzone High dose)   I am having Mr. Ruhlman maintain his loratadine, pravastatin, ONETOUCH VERIO, Insulin  Pen Needle, Insulin Isophane & Regular Human, carvedilol, LANTUS SOLOSTAR, amoxicillin, furosemide, ONETOUCH DELICA LANCETS 72S, blood glucose meter kit and supplies, ONETOUCH VERIO, losartan, LANTUS, allopurinol, apixaban, INSULIN SYRINGE .5CC/31GX5/16", and insulin NPH-regular Human.  No orders of the defined types were placed in this encounter.    Follow-up: Return in about 4 months (around 06/02/2017).  Scarlette Calico, MD

## 2017-01-31 NOTE — Progress Notes (Signed)
Remote pacemaker transmission.   

## 2017-02-01 LAB — CUP PACEART REMOTE DEVICE CHECK
Battery Remaining Percentage: 46 %
Brady Statistic AP VP Percent: 1 %
Brady Statistic AP VS Percent: 1 %
Brady Statistic AS VS Percent: 1 %
Implantable Lead Implant Date: 20111202
Implantable Lead Location: 753860
Lead Channel Impedance Value: 430 Ohm
Lead Channel Pacing Threshold Amplitude: 1.25 V
Lead Channel Pacing Threshold Pulse Width: 0.4 ms
Lead Channel Sensing Intrinsic Amplitude: 1.5 mV
Lead Channel Sensing Intrinsic Amplitude: 9.1 mV
Lead Channel Setting Pacing Amplitude: 2 V
Lead Channel Setting Pacing Amplitude: 2.5 V
Lead Channel Setting Pacing Pulse Width: 0.4 ms
Lead Channel Setting Sensing Sensitivity: 4 mV
MDC IDC LEAD IMPLANT DT: 20111202
MDC IDC LEAD LOCATION: 753859
MDC IDC MSMT BATTERY REMAINING LONGEVITY: 50 mo
MDC IDC MSMT BATTERY VOLTAGE: 2.86 V
MDC IDC MSMT LEADCHNL RA IMPEDANCE VALUE: 430 Ohm
MDC IDC MSMT LEADCHNL RA PACING THRESHOLD AMPLITUDE: 0.75 V
MDC IDC MSMT LEADCHNL RA PACING THRESHOLD PULSEWIDTH: 0.4 ms
MDC IDC PG IMPLANT DT: 20111202
MDC IDC SESS DTM: 20180918071950
MDC IDC STAT BRADY AS VP PERCENT: 98 %
MDC IDC STAT BRADY RA PERCENT PACED: 1 %
MDC IDC STAT BRADY RV PERCENT PACED: 99 %
Pulse Gen Model: 2210
Pulse Gen Serial Number: 7192434

## 2017-02-02 ENCOUNTER — Ambulatory Visit (INDEPENDENT_AMBULATORY_CARE_PROVIDER_SITE_OTHER): Payer: Medicare Other | Admitting: Internal Medicine

## 2017-02-02 ENCOUNTER — Encounter: Payer: Self-pay | Admitting: Internal Medicine

## 2017-02-02 ENCOUNTER — Encounter: Payer: Self-pay | Admitting: Cardiology

## 2017-02-02 VITALS — BP 132/78 | HR 89 | Ht 68.0 in | Wt 278.0 lb

## 2017-02-02 DIAGNOSIS — I10 Essential (primary) hypertension: Secondary | ICD-10-CM

## 2017-02-02 DIAGNOSIS — I442 Atrioventricular block, complete: Secondary | ICD-10-CM | POA: Diagnosis not present

## 2017-02-02 DIAGNOSIS — I480 Paroxysmal atrial fibrillation: Secondary | ICD-10-CM

## 2017-02-02 NOTE — Patient Instructions (Signed)

## 2017-02-02 NOTE — Progress Notes (Signed)
HPI Connor Dixon returns today for follow-up. He is an 82 year old man with a history of complete heart block, morbid obesity, hypertension, status post permanent pacemaker insertion. In the interim he has done well although he continues to admit to dietary indiscretion. His weight is down a proximal he 6 pounds from his last visit. He denies syncope or chest pain. He has become more sedentary but does not have any specific shortness of breath. He was bothered by peripheral edema, that had his diuretic dose increased and his edema resolved. Allergies  Allergen Reactions  . Oysters [Shellfish Allergy]     Blisters and swelling  . Apple Other (See Comments)    Sore throat     Current Outpatient Prescriptions  Medication Sig Dispense Refill  . allopurinol (ZYLOPRIM) 100 MG tablet TAKE 1 TABLET(100 MG) BY MOUTH DAILY 90 tablet 0  . apixaban (ELIQUIS) 5 MG TABS tablet TAKE 1 TABLET(5 MG) BY MOUTH TWICE DAILY 60 tablet 1  . blood glucose meter kit and supplies KIT Dispense what is covered by patients insurance. 1 each 0  . Blood Glucose Monitoring Suppl (ONETOUCH VERIO) w/Device KIT 1 Device by Does not apply route daily. Use as directed 1 kit 0  . carvedilol (COREG) 6.25 MG tablet TAKE 1 TABLET(6.25 MG) BY MOUTH TWICE DAILY 60 tablet 11  . insulin NPH-regular Human (HUMULIN 70/30) (70-30) 100 UNIT/ML injection Inject 65 Units into the skin daily with breakfast. Must keep scheduled appt for future refills 10 mL 0  . Insulin Pen Needle (B-D ULTRAFINE III SHORT PEN) 31G X 8 MM MISC Use to administer insulin three times a day. Dx E11.9 100 each 11  . Insulin Syringe-Needle U-100 (INSULIN SYRINGE .5CC/31GX5/16") 31G X 5/16" 0.5 ML MISC USE AS DIRECTED FOUR TIMES DAILY 200 each 1  . LANTUS 100 UNIT/ML injection INJECT 25 UNITS INTO THE SKIN AT BEDTIME 30 mL 3  . loratadine (CLARITIN) 10 MG tablet Take 10 mg by mouth daily as needed for allergies.     Marland Kitchen losartan (COZAAR) 50 MG tablet TAKE 1/2  TABLET BY MOUTH EVERY DAY. APPOINTMENT NEEDED FOR REFILLS 45 tablet 1  . ONETOUCH DELICA LANCETS 54T MISC TEST THREE TIMES DAILY 200 each 11  . ONETOUCH VERIO test strip TEST 5 TIMES DAILY 500 each 3  . pravastatin (PRAVACHOL) 40 MG tablet Take 40 mg by mouth daily.    Marland Kitchen amoxicillin (AMOXIL) 500 MG capsule TAKE ONE CAPSULE BY MOUTH THREE TIMES DAILY. TAKE ONLY WHEN HAVING DENTAL PROCEDURES (Patient not taking: Reported on 02/02/2017) 30 capsule 2  . furosemide (LASIX) 40 MG tablet Take 1 tablet (40 mg total) by mouth 2 (two) times daily. 180 tablet 3  . Insulin Isophane & Regular Human (HUMULIN 70/30 MIX) (70-30) 100 UNIT/ML PEN Inject 65 Units into the skin 2 (two) times daily. (Patient not taking: Reported on 02/02/2017) 15 mL 11  . LANTUS SOLOSTAR 100 UNIT/ML Solostar Pen ADMINISTER 25 UNITS UNDER THE SKIN DAILY AT 10 PM (Patient not taking: Reported on 02/02/2017) 15 mL 5   No current facility-administered medications for this visit.      Past Medical History:  Diagnosis Date  . Acute on chronic diastolic congestive heart failure ([place])   . Arthus phenomenon   . Atrioventricular block, complete ([place])   . Cardiac pacemaker in situ 07/22/2010  . Chronic kidney disease, stage 3    Presumably from longstanding DM & HTN. Scr has gradually increased over the past 2 years (  1.6 in 12/2010, 2 in 03/2011, and up to 2.1 in 01/2013)  . DIABETES MELLITUS, TYPE I, ADULT ONSET 08/06/2007  . DM (diabetes mellitus) ([place])    adult onset   . Essential hypertension, benign 08/06/2007   Very good BP control  . History of second degree heart block   . HLD (hyperlipidemia)   . HYPERLIPIDEMIA 08/06/2007  . Hypopotassemia   . Morbid obesity ([place])   . Obesity   . Other and unspecified angina pectoris    typical  . Type I (juvenile type) diabetes mellitus without mention of complication, not stated as uncontrolled    (04/25/13 OV with Dr. Marval Regal) = Poorly controlled & is working  w/Dr. Crissie Sickles to improve  this. Most recent Hgb A1c was 8.2% but Connor Dixon reports it was down to 7.6 last month.    ROS:   All systems reviewed and negative except as noted in the HPI.   Past Surgical History:  Procedure Laterality Date  . CATARACT EXTRACTION  8/08   [place]  . PTVDP  12/11   PPM - [employer]  . TOTAL HIP ARTHROPLASTY  04/05/06   right     Family History  Problem Relation Age of Onset  . Asthma Father   . Coronary artery disease Father   . Diabetes Father   . Hyperlipidemia Father   . Stroke Father 69  . Coronary artery disease Mother   . Asthma Mother   . Heart attack Mother 42  . Colon cancer Neg Hx        no definate prostate CA      Social History   Social History  . Marital status: Married    Spouse name: N/A  . Number of children: N/A  . Years of education: N/A   Occupational History  . Not on file.   Social History Main Topics  . Smoking status: Never Smoker  . Smokeless tobacco: Never Used  . Alcohol use No  . Drug use: No  . Sexual activity: Not Currently   Other Topics Concern  . Not on file   Social History Narrative   [employer] BA, 2 Master's degrees. [employer] - for certification Education specialist.    Retired Tree surgeon.   Active - travels.   Married - 1961; 2 sons ([place]) and 5 grandchildren.        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      BP 132/78   Pulse 89   Ht '5\' 8"'  (1.727 m)   Wt 278 lb (126.1 kg)   SpO2 97%   BMI 42.27 kg/m   Physical Exam:  Well appearing 82 year old man, obese, NAD HEENT: Unremarkable Neck:  7 cm JVD, no thyromegally Lymphatics:  No adenopathy Back:  No CVA tenderness Lungs:  Clear, with no wheezes, rales, or rhonchi HEART:  Regular rate rhythm, no murmurs, no rubs, no clicks Abd:  soft, obese, positive bowel sounds, no organomegally, no rebound, no guarding Ext:  2 plus pulses, trace peripheral edema, no cyanosis, no clubbing Skin:  No rashes no nodules Neuro:  CN  II through XII intact, motor grossly intact  EKG - none today  DEVICE  Normal device function.  See PaceArt for details.   Assess/Plan: 1. Complete heart block - he is asymptomatic status post pacemaker insertion 2. Hypertension - his blood pressure is well controlled today. He will continue his current medications, and reduce his salt intake, and he is encouraged to lose weight.  3. Chronic diastolic heart failure - his symptoms are class II. He will continue his current meds. 4. Paroxysmal atrial fibrillation - he is maintaining sinus rhythm greater than 99% of the time. He will continue his current medications. 5. Obesity - we discussed the importance of weight loss. He admits to dietary indiscretion. I've asked the patient to increase his physical activity, and eat less.  Cristopher Peru, M.D.

## 2017-02-04 ENCOUNTER — Other Ambulatory Visit: Payer: Self-pay | Admitting: Internal Medicine

## 2017-02-06 ENCOUNTER — Encounter: Payer: Self-pay | Admitting: Internal Medicine

## 2017-02-06 ENCOUNTER — Other Ambulatory Visit: Payer: Self-pay | Admitting: Internal Medicine

## 2017-02-06 LAB — CUP PACEART INCLINIC DEVICE CHECK
Battery Remaining Longevity: 42 mo
Battery Voltage: 2.86 V
Brady Statistic RA Percent Paced: 0.36 %
Brady Statistic RV Percent Paced: 99.23 %
Date Time Interrogation Session: 20180920144828
Implantable Lead Implant Date: 20111202
Implantable Lead Location: 753860
Implantable Pulse Generator Implant Date: 20111202
Lead Channel Impedance Value: 450 Ohm
Lead Channel Pacing Threshold Amplitude: 0.75 V
Lead Channel Pacing Threshold Pulse Width: 0.4 ms
Lead Channel Pacing Threshold Pulse Width: 0.6 ms
Lead Channel Setting Pacing Amplitude: 2 V
Lead Channel Setting Pacing Amplitude: 2.5 V
Lead Channel Setting Pacing Pulse Width: 0.8 ms
MDC IDC LEAD IMPLANT DT: 20111202
MDC IDC LEAD LOCATION: 753859
MDC IDC MSMT LEADCHNL RA SENSING INTR AMPL: 1.8 mV
MDC IDC MSMT LEADCHNL RV IMPEDANCE VALUE: 437.5 Ohm
MDC IDC MSMT LEADCHNL RV PACING THRESHOLD AMPLITUDE: 1.5 V
MDC IDC MSMT LEADCHNL RV SENSING INTR AMPL: 9.1 mV
MDC IDC PG SERIAL: 7192434
MDC IDC SET LEADCHNL RV SENSING SENSITIVITY: 4 mV
Pulse Gen Model: 2210

## 2017-02-06 MED ORDER — INSULIN NPH ISOPHANE & REGULAR (70-30) 100 UNIT/ML ~~LOC~~ SUSP: 65.0000 [IU] | Freq: Every day | SUBCUTANEOUS | 3 refills | Status: DC

## 2017-03-01 ENCOUNTER — Other Ambulatory Visit: Payer: Self-pay | Admitting: Internal Medicine

## 2017-03-01 DIAGNOSIS — M1A3790 Chronic gout due to renal impairment, unspecified ankle and foot, without tophus (tophi): Secondary | ICD-10-CM

## 2017-03-15 ENCOUNTER — Other Ambulatory Visit: Payer: Self-pay | Admitting: Internal Medicine

## 2017-03-21 ENCOUNTER — Other Ambulatory Visit: Payer: Self-pay | Admitting: Internal Medicine

## 2017-03-21 MED ORDER — APIXABAN 2.5 MG PO TABS: 2.5000 mg | ORAL_TABLET | Freq: Two times a day (BID) | ORAL | 5 refills | Status: DC

## 2017-03-21 NOTE — Telephone Encounter (Signed)
Pt now 82 and SCr > 1.5, qualifies for lower dose of Eliquis 2.5mg  BID. Spoke with pt who is aware of dose reduction.

## 2017-03-26 ENCOUNTER — Other Ambulatory Visit: Payer: Self-pay | Admitting: Internal Medicine

## 2017-04-17 ENCOUNTER — Other Ambulatory Visit: Payer: Self-pay | Admitting: Internal Medicine

## 2017-05-02 ENCOUNTER — Ambulatory Visit (INDEPENDENT_AMBULATORY_CARE_PROVIDER_SITE_OTHER): Payer: Medicare Other | Admitting: *Deleted

## 2017-05-02 DIAGNOSIS — I442 Atrioventricular block, complete: Secondary | ICD-10-CM

## 2017-05-02 NOTE — Progress Notes (Signed)
Remote pacemaker transmission.   

## 2017-05-03 ENCOUNTER — Encounter: Payer: Self-pay | Admitting: Cardiology

## 2017-05-04 ENCOUNTER — Other Ambulatory Visit: Payer: Self-pay | Admitting: Internal Medicine

## 2017-05-04 ENCOUNTER — Other Ambulatory Visit: Payer: Self-pay

## 2017-05-04 LAB — CUP PACEART REMOTE DEVICE CHECK
Battery Remaining Longevity: 31 mo
Battery Remaining Percentage: 35 %
Brady Statistic AP VP Percent: 1 %
Brady Statistic AP VS Percent: 1 %
Brady Statistic AS VP Percent: 98 %
Brady Statistic AS VS Percent: 1 %
Brady Statistic RA Percent Paced: 1 %
Implantable Lead Implant Date: 20111202
Implantable Lead Location: 753859
Implantable Lead Location: 753860
Lead Channel Impedance Value: 410 Ohm
Lead Channel Pacing Threshold Amplitude: 1.5 V
Lead Channel Pacing Threshold Pulse Width: 0.6 ms
Lead Channel Sensing Intrinsic Amplitude: 1.2 mV
Lead Channel Setting Pacing Amplitude: 2.5 V
Lead Channel Setting Pacing Pulse Width: 0.8 ms
Lead Channel Setting Sensing Sensitivity: 4 mV
MDC IDC LEAD IMPLANT DT: 20111202
MDC IDC MSMT BATTERY VOLTAGE: 2.83 V
MDC IDC MSMT LEADCHNL RA PACING THRESHOLD AMPLITUDE: 0.75 V
MDC IDC MSMT LEADCHNL RA PACING THRESHOLD PULSEWIDTH: 0.4 ms
MDC IDC MSMT LEADCHNL RV IMPEDANCE VALUE: 390 Ohm
MDC IDC MSMT LEADCHNL RV SENSING INTR AMPL: 9.6 mV
MDC IDC PG IMPLANT DT: 20111202
MDC IDC PG SERIAL: 7192434
MDC IDC SESS DTM: 20181218075217
MDC IDC SET LEADCHNL RA PACING AMPLITUDE: 2 V
MDC IDC STAT BRADY RV PERCENT PACED: 99 %

## 2017-05-04 MED ORDER — GLUCOSE BLOOD VI STRP: ORAL_STRIP | 3 refills | Status: DC

## 2017-06-08 ENCOUNTER — Encounter: Payer: Self-pay | Admitting: Internal Medicine

## 2017-07-21 LAB — HM DIABETES EYE EXAM

## 2017-07-25 ENCOUNTER — Encounter: Payer: Self-pay | Admitting: Internal Medicine

## 2017-07-25 NOTE — Progress Notes (Signed)
Result abstracted and sent to scan ° °

## 2017-08-01 ENCOUNTER — Ambulatory Visit (INDEPENDENT_AMBULATORY_CARE_PROVIDER_SITE_OTHER): Payer: Medicare Other | Admitting: *Deleted

## 2017-08-01 DIAGNOSIS — I442 Atrioventricular block, complete: Secondary | ICD-10-CM

## 2017-08-01 NOTE — Progress Notes (Signed)
Remote pacemaker transmission.   

## 2017-08-02 ENCOUNTER — Encounter: Payer: Self-pay | Admitting: Cardiology

## 2017-08-02 LAB — CUP PACEART REMOTE DEVICE CHECK
Battery Remaining Longevity: 23 mo
Battery Remaining Percentage: 25 %
Battery Voltage: 2.8 V
Brady Statistic AP VP Percent: 1.1 %
Brady Statistic AS VP Percent: 98 %
Brady Statistic AS VS Percent: 1 %
Brady Statistic RA Percent Paced: 1 %
Brady Statistic RV Percent Paced: 99 %
Date Time Interrogation Session: 20190319102256
Implantable Lead Implant Date: 20111202
Implantable Lead Implant Date: 20111202
Implantable Lead Location: 753859
Implantable Pulse Generator Implant Date: 20111202
Lead Channel Impedance Value: 410 Ohm
Lead Channel Impedance Value: 440 Ohm
Lead Channel Pacing Threshold Amplitude: 0.75 V
Lead Channel Pacing Threshold Amplitude: 1.5 V
Lead Channel Pacing Threshold Pulse Width: 0.4 ms
Lead Channel Sensing Intrinsic Amplitude: 1.8 mV
Lead Channel Sensing Intrinsic Amplitude: 6.9 mV
Lead Channel Setting Pacing Amplitude: 2 V
Lead Channel Setting Pacing Pulse Width: 0.8 ms
Lead Channel Setting Sensing Sensitivity: 4 mV
MDC IDC LEAD LOCATION: 753860
MDC IDC MSMT LEADCHNL RV PACING THRESHOLD PULSEWIDTH: 0.6 ms
MDC IDC PG SERIAL: 7192434
MDC IDC SET LEADCHNL RV PACING AMPLITUDE: 2.5 V
MDC IDC STAT BRADY AP VS PERCENT: 1 %

## 2017-08-30 ENCOUNTER — Other Ambulatory Visit: Payer: Self-pay | Admitting: Internal Medicine

## 2017-08-31 NOTE — Telephone Encounter (Signed)
Eliquis 2.5mg  refill request received; pt is 83 yrs old, wt-126.1kg, Crea-1.80 on 12/22/16, last seen by Dr. Ladona Ridgelaylor on 02/02/17; will send in refill to requested pharmacy.

## 2017-08-31 NOTE — Telephone Encounter (Signed)
Pt needs an appt for refills.  

## 2017-09-02 ENCOUNTER — Other Ambulatory Visit: Payer: Self-pay | Admitting: Internal Medicine

## 2017-09-02 DIAGNOSIS — M1A3790 Chronic gout due to renal impairment, unspecified ankle and foot, without tophus (tophi): Secondary | ICD-10-CM

## 2017-09-06 MED ORDER — CARVEDILOL 6.25 MG PO TABS: 6.2500 mg | ORAL_TABLET | Freq: Two times a day (BID) | ORAL | 0 refills | Status: DC

## 2017-09-06 NOTE — Telephone Encounter (Signed)
Appt made

## 2017-09-07 ENCOUNTER — Other Ambulatory Visit (INDEPENDENT_AMBULATORY_CARE_PROVIDER_SITE_OTHER): Payer: Medicare Other

## 2017-09-07 ENCOUNTER — Ambulatory Visit: Payer: Medicare Other | Admitting: Internal Medicine

## 2017-09-07 ENCOUNTER — Encounter: Payer: Self-pay | Admitting: Internal Medicine

## 2017-09-07 DIAGNOSIS — Z794 Long term (current) use of insulin: Secondary | ICD-10-CM | POA: Diagnosis not present

## 2017-09-07 DIAGNOSIS — N183 Chronic kidney disease, stage 3 unspecified: Secondary | ICD-10-CM

## 2017-09-07 DIAGNOSIS — I10 Essential (primary) hypertension: Secondary | ICD-10-CM

## 2017-09-07 DIAGNOSIS — E1140 Type 2 diabetes mellitus with diabetic neuropathy, unspecified: Secondary | ICD-10-CM

## 2017-09-07 LAB — BASIC METABOLIC PANEL
BUN: 34 mg/dL — ABNORMAL HIGH (ref 6–23)
CALCIUM: 9.4 mg/dL (ref 8.4–10.5)
CO2: 30 mEq/L (ref 19–32)
Chloride: 100 mEq/L (ref 96–112)
Creatinine, Ser: 1.65 mg/dL — ABNORMAL HIGH (ref 0.40–1.50)
GFR: 51.8 mL/min — AB (ref >60.00)
Glucose, Bld: 298 mg/dL — ABNORMAL HIGH (ref 70–99)
Potassium: 4.3 mEq/L (ref 3.5–5.1)
SODIUM: 138 meq/L (ref 135–145)

## 2017-09-07 LAB — URINALYSIS, ROUTINE W REFLEX MICROSCOPIC
BILIRUBIN URINE: NEGATIVE
Ketones, ur: NEGATIVE
LEUKOCYTES UA: NEGATIVE
Nitrite: NEGATIVE
Specific Gravity, Urine: 1.015 (ref 1.000–1.030)
Total Protein, Urine: 30 — AB
Urine Glucose: >=1000 — AB
Urobilinogen, UA: 0.2 (ref 0.0–1.0)
pH: 6 (ref 5.0–8.0)

## 2017-09-07 LAB — HEMOGLOBIN A1C: HEMOGLOBIN A1C: 7.6 % — AB (ref 4.6–6.5)

## 2017-09-07 NOTE — Patient Instructions (Signed)

## 2017-09-07 NOTE — Progress Notes (Signed)
Subjective:  Patient ID: Connor Dixon, male    DOB: 1934-05-29  Age: 83 y.o. MRN: [id number]  CC: Hypertension and Diabetes   HPI Connor Dixon presents for f/up - He is managing his blood sugars with a combination of 70/30 mix and basal insulin.  He is under a lot of stress with an ill wife who has pancreatic cancer so he has had a hard time taking care of himself recently but for the most part his blood sugars have been around the 180 - 220 range.  He denies any recent episodes of polyuria, polydipsia, or follow-up polyphagia.  Outpatient Medications Prior to Visit  Medication Sig Dispense Refill  . allopurinol (ZYLOPRIM) 100 MG tablet TAKE 1 TABLET(100 MG) BY MOUTH DAILY 90 tablet 0  . amoxicillin (AMOXIL) 500 MG capsule TAKE ONE CAPSULE BY MOUTH THREE TIMES DAILY. TAKE ONLY WHEN HAVING DENTAL PROCEDURES 30 capsule 2  . blood glucose meter kit and supplies KIT Dispense what is covered by patients insurance. 1 each 0  . Blood Glucose Monitoring Suppl (ONETOUCH VERIO) w/Device KIT 1 Device by Does not apply route daily. Use as directed 1 kit 0  . carvedilol (COREG) 6.25 MG tablet Take 1 tablet (6.25 mg total) by mouth 2 (two) times daily with a meal. 60 tablet 0  . ELIQUIS 2.5 MG TABS tablet TAKE 1 TABLET BY MOUTH TWICE DAILY 60 tablet 5  . glucose blood (ONETOUCH VERIO) test strip Use to check blood sugar up to 4 times per day. DX E11.8 500 each 3  . Insulin Isophane & Regular Human (HUMULIN 70/30 MIX) (70-30) 100 UNIT/ML PEN Inject 65 Units into the skin 2 (two) times daily. 15 mL 11  . insulin NPH-regular Human (HUMULIN 70/30) (70-30) 100 UNIT/ML injection Inject 65 Units into the skin daily with breakfast. 20 mL 3  . Insulin Pen Needle (B-D ULTRAFINE III SHORT PEN) 31G X 8 MM MISC USE TO ADMINISTER INSULIN THREE TIMES DAILY 100 each 11  . Insulin Syringe-Needle U-100 (INSULIN SYRINGE .5CC/31GX5/16") 31G X 5/16" 0.5 ML MISC USE AS DIRECTED FOUR TIMES DAILY 200 each 3  . LANTUS 100  UNIT/ML injection INJECT 25 UNITS INTO THE SKIN AT BEDTIME 30 mL 3  . LANTUS SOLOSTAR 100 UNIT/ML Solostar Pen ADMINISTER 25 UNITS UNDER THE SKIN DAILY AT 10 PM 15 mL 5  . losartan (COZAAR) 50 MG tablet TAKE 1/2 TABLET BY MOUTH EVERY DAY. APPOINTMENT NEEDED FOR REFILLS 45 tablet 1  . ONETOUCH DELICA LANCETS 80D MISC TEST THREE TIMES DAILY 200 each 11  . pravastatin (PRAVACHOL) 40 MG tablet Take 40 mg by mouth daily.    Marland Kitchen loratadine (CLARITIN) 10 MG tablet Take 10 mg by mouth daily as needed for allergies.     . furosemide (LASIX) 40 MG tablet Take 1 tablet (40 mg total) by mouth 2 (two) times daily. 180 tablet 3   No facility-administered medications prior to visit.     ROS Review of Systems  Constitutional: Negative.  Negative for diaphoresis, fatigue and unexpected weight change.  HENT: Negative.   Eyes: Negative for visual disturbance.  Respiratory: Negative for cough, chest tightness, shortness of breath and wheezing.   Cardiovascular: Negative.  Negative for chest pain, palpitations and leg swelling.  Gastrointestinal: Negative for abdominal pain, diarrhea, nausea and vomiting.  Endocrine: Negative for polydipsia, polyphagia and polyuria.  Genitourinary: Negative.   Musculoskeletal: Negative.  Negative for arthralgias, back pain, myalgias and neck pain.  Skin: Negative.  Negative for  color change, pallor and rash.  Allergic/Immunologic: Negative.   Neurological: Negative.  Negative for dizziness, weakness and light-headedness.  Hematological: Negative for adenopathy. Does not bruise/bleed easily.  Psychiatric/Behavioral: Negative.     Objective:  BP 130/80 (BP Location: Left Arm, Patient Position: Sitting, Cuff Size: Large)   Pulse 85   Temp 97.7 F (36.5 C) (Oral)   Resp 16   Ht _0  (1.727 m)   Wt 275 lb 4 oz (124.9 kg)   SpO2 97%   BMI 41.85 kg/m   BP Readings from Last 3 Encounters:  09/07/17 130/80  02/02/17 132/78  01/31/17 140/80    Wt Readings from Last 3  Encounters:  09/07/17 275 lb 4 oz (124.9 kg)  02/02/17 278 lb (126.1 kg)  01/31/17 293 lb (132.9 kg)    Physical Exam  Constitutional: He is oriented to person, place, and time. No distress.  HENT:  Mouth/Throat: Oropharynx is clear and moist. No oropharyngeal exudate.  Eyes: Conjunctivae are normal. No scleral icterus.  Neck: Normal range of motion. Neck supple. No thyromegaly present.  Cardiovascular: Normal rate, regular rhythm and normal heart sounds. Exam reveals no gallop and no friction rub.  No murmur heard. Pulmonary/Chest: Effort normal and breath sounds normal. No stridor. No respiratory distress. He has no wheezes. He has no rales.  Abdominal: Soft. Bowel sounds are normal. He exhibits no distension and no mass. There is no tenderness.  Musculoskeletal: Normal range of motion. He exhibits no edema, tenderness or deformity.  Lymphadenopathy:    He has no cervical adenopathy.  Neurological: He is alert and oriented to person, place, and time.  Skin: Skin is warm and dry. He is not diaphoretic.  Vitals reviewed.   Lab Results  Component Value Date   WBC 5.8 09/01/2016   HGB 15.8 09/01/2016   HCT 46 09/01/2016   PLT 175 09/01/2016   GLUCOSE 298 (H) 09/07/2017   CHOL 153 01/31/2017   TRIG 103.0 01/31/2017   HDL 55.80 01/31/2017   LDLCALC 76 01/31/2017   ALT 19 12/22/2016   AST 31 12/22/2016   NA 138 09/07/2017   K 4.3 09/07/2017   CL 100 09/07/2017   CREATININE 1.65 (H) 09/07/2017   BUN 34 (H) 09/07/2017   CO2 30 09/07/2017   TSH 1.55 01/27/2016   PSA 1.77 01/06/2010   HGBA1C 7.6 (H) 09/07/2017   MICROALBUR 26.3 (H) 01/31/2017    No results found.  Assessment & Plan:   [name] was seen today for hypertension and diabetes.  Diagnoses and all orders for this visit:  Morbid obesity ([place])- He agrees to work on his lifestyle modifications to lose weight.  Type 2 diabetes mellitus with diabetic neuropathy, with long-term current use of insulin ([place])- His A1c  is at 7.6%.  Considering his age and comorbid illnesses his blood sugars are adequately well controlled.  Also, he is not willing to take any oral agents. -     Basic metabolic panel; Future -     Hemoglobin A1c; Future  Kidney disease, chronic, stage III (GFR 30-59 ml/min) ([facility])- His renal function is stable with a creatinine clearance of 46 mL/ minute.  He agrees to avoid nephrotoxic agents and to maintain good good control of his blood sugars and blood pressure. -     Basic metabolic panel; Future -     Urinalysis, Routine w reflex microscopic; Future  Essential hypertension- His blood pressure is well controlled.  Renal function is stable and electrolytes are normal. -  Basic metabolic panel; Future -     Urinalysis, Routine w reflex microscopic; Future   I have discontinued Mallie Mussel C. [place]'s loratadine. I am also having him maintain his pravastatin, Insulin Isophane & Regular Human, LANTUS SOLOSTAR, amoxicillin, furosemide, ONETOUCH DELICA LANCETS 44E, blood glucose meter kit and supplies, ONETOUCH VERIO, LANTUS, insulin NPH-regular Human, Insulin Pen Needle, INSULIN SYRINGE .5CC/31GX5/16", losartan, glucose blood, ELIQUIS, allopurinol, and carvedilol.  No orders of the defined types were placed in this encounter.    Follow-up: Return in about 6 months (around 03/09/2018).  Scarlette Calico, MD

## 2017-09-24 ENCOUNTER — Other Ambulatory Visit: Payer: Self-pay | Admitting: Internal Medicine

## 2017-09-24 DIAGNOSIS — E785 Hyperlipidemia, unspecified: Secondary | ICD-10-CM

## 2017-09-24 DIAGNOSIS — I10 Essential (primary) hypertension: Secondary | ICD-10-CM

## 2017-09-26 ENCOUNTER — Other Ambulatory Visit: Payer: Self-pay | Admitting: Internal Medicine

## 2017-10-04 ENCOUNTER — Telehealth: Payer: Self-pay | Admitting: Emergency Medicine

## 2017-10-04 NOTE — Telephone Encounter (Signed)
Called patient to schedule AWV. Pt declined at this time. 

## 2017-10-31 ENCOUNTER — Ambulatory Visit (INDEPENDENT_AMBULATORY_CARE_PROVIDER_SITE_OTHER): Payer: Medicare Other | Admitting: *Deleted

## 2017-10-31 DIAGNOSIS — I442 Atrioventricular block, complete: Secondary | ICD-10-CM | POA: Diagnosis not present

## 2017-10-31 NOTE — Progress Notes (Signed)
Remote pacemaker transmission.   

## 2017-11-18 LAB — CUP PACEART REMOTE DEVICE CHECK
Battery Voltage: 2.78 V
Brady Statistic AP VP Percent: 1.2 %
Brady Statistic AS VP Percent: 98 %
Brady Statistic RA Percent Paced: 1 %
Brady Statistic RV Percent Paced: 99 %
Date Time Interrogation Session: 20190618065622
Implantable Lead Implant Date: 20111202
Implantable Lead Location: 753859
Implantable Lead Location: 753860
Implantable Pulse Generator Implant Date: 20111202
Lead Channel Impedance Value: 400 Ohm
Lead Channel Pacing Threshold Amplitude: 0.75 V
Lead Channel Pacing Threshold Amplitude: 1.5 V
Lead Channel Pacing Threshold Pulse Width: 0.4 ms
Lead Channel Sensing Intrinsic Amplitude: 10.5 mV
Lead Channel Setting Pacing Amplitude: 2 V
MDC IDC LEAD IMPLANT DT: 20111202
MDC IDC MSMT BATTERY REMAINING LONGEVITY: 20 mo
MDC IDC MSMT BATTERY REMAINING PERCENTAGE: 22 %
MDC IDC MSMT LEADCHNL RA IMPEDANCE VALUE: 440 Ohm
MDC IDC MSMT LEADCHNL RA SENSING INTR AMPL: 1.9 mV
MDC IDC MSMT LEADCHNL RV PACING THRESHOLD PULSEWIDTH: 0.6 ms
MDC IDC SET LEADCHNL RV PACING AMPLITUDE: 2.5 V
MDC IDC SET LEADCHNL RV PACING PULSEWIDTH: 0.8 ms
MDC IDC SET LEADCHNL RV SENSING SENSITIVITY: 4 mV
MDC IDC STAT BRADY AP VS PERCENT: 1 %
MDC IDC STAT BRADY AS VS PERCENT: 1 %
Pulse Gen Model: 2210
Pulse Gen Serial Number: 7192434

## 2017-11-23 ENCOUNTER — Other Ambulatory Visit: Payer: Self-pay | Admitting: Internal Medicine

## 2017-11-23 DIAGNOSIS — M1A3790 Chronic gout due to renal impairment, unspecified ankle and foot, without tophus (tophi): Secondary | ICD-10-CM

## 2017-12-01 ENCOUNTER — Other Ambulatory Visit: Payer: Self-pay | Admitting: Internal Medicine

## 2017-12-16 ENCOUNTER — Other Ambulatory Visit: Payer: Self-pay | Admitting: Internal Medicine

## 2018-01-08 NOTE — Progress Notes (Addendum)
Subjective:   Connor Dixon is a 83 y.o. male who presents for Medicare Annual/Subsequent preventive examination.  Review of Systems:  No ROS.  Medicare Wellness Visit. Additional risk factors are reflected in the social history.  Cardiac Risk Factors include: advanced age (>74mn, >>80women);diabetes mellitus;dyslipidemia;hypertension;male gender;obesity (BMI >30kg/m2) Sleep patterns: feels rested on waking, does not get up to void, gets up 1-2 times nightly to void and sleeps 6-7 hours nightly.    Home Safety/Smoke Alarms: Feels safe in home. Smoke alarms in place.  Living environment; residence and Firearm Safety: 1-story house/ trailer, no firearms.  Lives alone, no needs for DME, good support system. Seat Belt Safety/Bike Helmet: Wears seat belt.   PSA-  Lab Results  Component Value Date   PSA 1.77 01/06/2010   PSA 1.80 08/06/2007   PSA 1.22 10/31/2006       Objective:    Vitals: BP 130/64   Pulse 72   Resp 18   Ht '5\' 8"'  (1.727 m)   Wt 276 lb (125.2 kg)   SpO2 98%   BMI 41.97 kg/m   Body mass index is 41.97 kg/m.  Advanced Directives 01/09/2018 01/28/2016 04/21/2015  Does Patient Have a Medical Advance Directive? Yes Yes Yes  Type of AParamedicof AVanceLiving will HLinneusLiving will -  Does patient want to make changes to medical advance directive? - No - Patient declined -  Copy of HBeckerin Chart? No - copy requested Yes -    Tobacco Social History   Tobacco Use  Smoking Status Never Smoker  Smokeless Tobacco Never Used     Counseling given: Not Answered  Past Medical History:  Diagnosis Date  . Acute on chronic diastolic congestive heart failure ([facility]   . Arthus phenomenon   . Atrioventricular block, complete ([facility]   . Cardiac pacemaker in situ 07/22/2010  . Chronic kidney disease, stage 3 (H[facility])    Presumably from longstanding DM & HTN. Scr has gradually increased over the past 2  years (1.6 in 12/2010, 2 in 03/2011, and up to 2.1 in 01/2013)  . DIABETES MELLITUS, TYPE I, ADULT ONSET 08/06/2007  . DM (diabetes mellitus) ([facility]    adult onset   . Essential hypertension, benign 08/06/2007   Very good BP control  . History of second degree heart block   . HLD (hyperlipidemia)   . HYPERLIPIDEMIA 08/06/2007  . Hypopotassemia   . Morbid obesity ([place]   . Obesity   . Other and unspecified angina pectoris    typical  . Type I (juvenile type) diabetes mellitus without mention of complication, not stated as uncontrolled    (04/25/13 OV with Dr. CMarval Regal = Poorly controlled & is working  w/Dr. NCrissie Sicklesto improve this. Most recent Hgb A1c was 8.2% but Mr. AGehringreports it was down to 7.6 last month.   Past Surgical History:  Procedure Laterality Date  . CATARACT EXTRACTION  8/08   [place]  . PTVDP  12/11   PPM - [employer]  . TOTAL HIP ARTHROPLASTY  04/05/06   right   Family History  Problem Relation Age of Onset  . Asthma Father   . Coronary artery disease Father   . Diabetes Father   . Hyperlipidemia Father   . Stroke Father 832 . Coronary artery disease Mother   . Asthma Mother   . Heart attack Mother 866 . Colon cancer Neg Hx  no definate prostate CA    Social History   Socioeconomic History  . Marital status: Widowed    Spouse name: Not on file  . Number of children: 2  . Years of education: Not on file  . Highest education level: Not on file  Occupational History  . Not on file  Social Needs  . Financial resource strain: Not hard at all  . Food insecurity:    Worry: Never true    Inability: Never true  . Transportation needs:    Medical: No    Non-medical: No  Tobacco Use  . Smoking status: Never Smoker  . Smokeless tobacco: Never Used  Substance and Sexual Activity  . Alcohol use: No  . Drug use: No  . Sexual activity: Not Currently  Lifestyle  . Physical activity:    Days per week: 0 days    Minutes per session: 0 min  .  Stress: Only a little  Relationships  . Social connections:    Talks on phone: More than three times a week    Gets together: More than three times a week    Attends religious service: More than 4 times per year    Active member of club or organization: Yes    Attends meetings of clubs or organizations: More than 4 times per year    Relationship status: Widowed  Other Topics Concern  . Not on file  Social History Narrative   [employer] BA, 2 Master's degrees. [place] - for certification Education specialist.    Retired Tree surgeon.   Active - travels.   Married - 1961; 2 sons (Evart) and 5 grandchildren.        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     Outpatient Encounter Medications as of 01/09/2018  Medication Sig  . allopurinol (ZYLOPRIM) 100 MG tablet TAKE 1 TABLET(100 MG) BY MOUTH DAILY  . amoxicillin (AMOXIL) 500 MG capsule TAKE ONE CAPSULE BY MOUTH THREE TIMES DAILY. TAKE ONLY WHEN HAVING DENTAL PROCEDURES  . blood glucose meter kit and supplies KIT Dispense what is covered by patients insurance.  . Blood Glucose Monitoring Suppl (ONETOUCH VERIO) w/Device KIT 1 Device by Does not apply route daily. Use as directed  . carvedilol (COREG) 6.25 MG tablet Take 1 tablet (6.25 mg total) by mouth 2 (two) times daily with a meal.  . ELIQUIS 2.5 MG TABS tablet TAKE 1 TABLET BY MOUTH TWICE DAILY  . glucose blood (ONETOUCH VERIO) test strip Use to check blood sugar up to 4 times per day. DX E11.8  . Insulin Isophane & Regular Human (HUMULIN 70/30 MIX) (70-30) 100 UNIT/ML PEN Inject 65 Units into the skin 2 (two) times daily.  . Insulin Pen Needle (B-D ULTRAFINE III SHORT PEN) 31G X 8 MM MISC USE TO ADMINISTER INSULIN THREE TIMES DAILY  . Insulin Syringe-Needle U-100 (INSULIN SYRINGE .5CC/31GX5/16") 31G X 5/16" 0.5 ML MISC USE AS DIRECTED FOUR TIMES DAILY  . LANTUS 100 UNIT/ML injection INJECT 25 UNITS INTO THE SKIN AT BEDTIME  . LANTUS SOLOSTAR 100 UNIT/ML  Solostar Pen ADMINISTER 25 UNITS UNDER THE SKIN DAILY AT 10 PM  . losartan (COZAAR) 50 MG tablet Take 0.5 tablets (25 mg total) by mouth daily.  Glory Rosebush DELICA LANCETS 67T MISC TEST THREE TIMES DAILY  . pravastatin (PRAVACHOL) 40 MG tablet Take 40 mg by mouth daily.  . furosemide (LASIX) 40 MG tablet Take 1 tablet (40 mg total) by mouth 2 (two) times daily.  . [  DISCONTINUED] insulin NPH-regular Human (HUMULIN 70/30) (70-30) 100 UNIT/ML injection Inject 65 Units into the skin daily with breakfast. (Patient not taking: Reported on 01/09/2018)  . [DISCONTINUED] pravastatin (PRAVACHOL) 40 MG tablet Take 1 tablet (40 mg total) by mouth daily. (Patient not taking: Reported on 01/09/2018)   No facility-administered encounter medications on file as of 01/09/2018.     Activities of Daily Living In your present state of health, do you have any difficulty performing the following activities: 01/09/2018  Hearing? N  Vision? N  Difficulty concentrating or making decisions? N  Walking or climbing stairs? N  Dressing or bathing? N  Doing errands, shopping? N  Preparing Food and eating ? N  Using the Toilet? N  In the past six months, have you accidently leaked urine? N  Do you have problems with loss of bowel control? N  Managing your Medications? N  Managing your Finances? N  Housekeeping or managing your Housekeeping? N  Some recent data might be hidden    Patient Care Team: Janith Lima, MD as PCP - General (Internal Medicine) Evans Lance, MD (Cardiology) Elsie Saas, MD (Orthopedic Surgery) Shon Hough, MD (Ophthalmology)   Assessment:   This is a routine wellness examination for Shun. Physical assessment deferred to PCP.   Exercise Activities and Dietary recommendations Current Exercise Habits: The patient does not participate in regular exercise at present(chair exercise print-outs provided), Exercise limited by: orthopedic condition(s)  Diet (meal preparation, eat  out, water intake, caffeinated beverages, dairy products, fruits and vegetables): in general, a "healthy" diet  , well balanced   Reviewed heart healthy and diabetic diet.Encouraged patient to increase daily water and healthy fluid intake. Discussed weight loss tips. Relevant patient education assigned to patient using Emmi.  Goals    . Patient Stated     Increase the amount of time that I work out in my yard and flower garden. Continue to love my family and enjoy life.       Fall Risk Fall Risk  01/09/2018 01/28/2016 04/21/2015 10/22/2013  Falls in the past year? No No No No  Risk for fall due to : Impaired balance/gait;Impaired mobility - - -    Depression Screen PHQ 2/9 Scores 01/09/2018 09/07/2017 09/07/2017 01/28/2016  PHQ - 2 Score 1 0 0 0  PHQ- 9 Score 1 0 0 -    Cognitive Function MMSE - Mini Mental State Exam 01/09/2018  Orientation to time 5  Orientation to Place 5  Registration 3  Attention/ Calculation 5  Recall 2  Language- name 2 objects 2  Language- repeat 1  Language- follow 3 step command 3  Language- read & follow direction 1  Write a sentence 1  Copy design 1  Total score 29        Immunization History  Administered Date(s) Administered  . H1N1 06/05/2008  . Influenza Split 02/22/2011  . Influenza Whole 02/04/2008, 02/24/2009, 02/05/2010  . Influenza, High Dose Seasonal PF 02/20/2015, 01/27/2016, 01/31/2017  . Influenza,inj,Quad PF,6+ Mos 01/15/2013  . Pneumococcal Conjugate-13 04/02/2013  . Pneumococcal Polysaccharide-23 01/09/2012  . Td 01/09/2012  . Zoster 02/28/2014   Screening Tests Health Maintenance  Topic Date Due  . INFLUENZA VACCINE  08/29/2018 (Originally 12/14/2017)  . FOOT EXAM  01/31/2018  . OPHTHALMOLOGY EXAM  07/22/2018  . TETANUS/TDAP  01/08/2022  . PNA vac Low Risk Adult  Completed      Plan:     Continue doing brain stimulating activities (puzzles, reading, adult coloring books, staying  active) to keep memory sharp.    Continue to eat heart healthy diet (full of fruits, vegetables, whole grains, lean protein, water--limit salt, fat, and sugar intake) and increase physical activity as tolerated.  I have personally reviewed and noted the following in the patient's chart:   . Medical and social history . Use of alcohol, tobacco or illicit drugs  . Current medications and supplements . Functional ability and status . Nutritional status . Physical activity . Advanced directives . List of other physicians . Vitals . Screenings to include cognitive, depression, and falls . Referrals and appointments  In addition, I have reviewed and discussed with patient certain preventive protocols, quality metrics, and best practice recommendations. A written personalized care plan for preventive services as well as general preventive health recommendations were provided to patient.     Michiel Cowboy, RN  01/09/2018  Medical screening examination/treatment/procedure(s) were performed by non-physician practitioner and as supervising physician I was immediately available for consultation/collaboration. I agree with above. Scarlette Calico, MD

## 2018-01-09 ENCOUNTER — Ambulatory Visit (INDEPENDENT_AMBULATORY_CARE_PROVIDER_SITE_OTHER): Payer: Medicare Other | Admitting: *Deleted

## 2018-01-09 VITALS — BP 130/64 | HR 72 | Resp 18 | Ht 68.0 in | Wt 276.0 lb

## 2018-01-09 DIAGNOSIS — Z0000 Encounter for general adult medical examination without abnormal findings: Secondary | ICD-10-CM

## 2018-01-09 NOTE — Patient Instructions (Addendum)
Continue doing brain stimulating activities (puzzles, reading, adult coloring books, staying active) to keep memory sharp.   Continue to eat heart healthy diet (full of fruits, vegetables, whole grains, lean protein, water--limit salt, fat, and sugar intake) and increase physical activity as tolerated.   [name] , Thank you for taking time to come for your Medicare Wellness Visit. I appreciate your ongoing commitment to your health goals. Please review the following plan we discussed and let me know if I can assist you in the future.   These are the goals we discussed: Goals    . Patient Stated     Increase the amount of time that I work out in my yard and flower garden. Continue to love my family and enjoy life.       This is a list of the screening recommended for you and due dates:  Health Maintenance  Topic Date Due  . Flu Shot  12/14/2017  . Complete foot exam   01/31/2018  . Eye exam for diabetics  07/22/2018  . Tetanus Vaccine  01/08/2022  . Pneumonia vaccines  Completed     Health Maintenance, Male A healthy lifestyle and preventive care is important for your health and wellness. Ask your health care provider about what schedule of regular examinations is right for you. What should I know about weight and diet? Eat a Healthy Diet  Eat plenty of vegetables, fruits, whole grains, low-fat dairy products, and lean protein.  Do not eat a lot of foods high in solid fats, added sugars, or salt.  Maintain a Healthy Weight Regular exercise can help you achieve or maintain a healthy weight. You should:  Do at least 150 minutes of exercise each week. The exercise should increase your heart rate and make you sweat (moderate-intensity exercise).  Do strength-training exercises at least twice a week.  Watch Your Levels of Cholesterol and Blood Lipids  Have your blood tested for lipids and cholesterol every 5 years starting at 81 years of age. If you are at high risk for heart  disease, you should start having your blood tested when you are 81 years old. You may need to have your cholesterol levels checked more often if: ? Your lipid or cholesterol levels are high. ? You are older than 81 years of age. ? You are at high risk for heart disease.  What should I know about cancer screening? Many types of cancers can be detected early and may often be prevented. Lung Cancer  You should be screened every year for lung cancer if: ? You are a current smoker who has smoked for at least 30 years. ? You are a former smoker who has quit within the past 15 years.  Talk to your health care provider about your screening options, when you should start screening, and how often you should be screened.  Colorectal Cancer  Routine colorectal cancer screening usually begins at 81 years of age and should be repeated every 5-10 years until you are 81 years old. You may need to be screened more often if early forms of precancerous polyps or small growths are found. Your health care provider may recommend screening at an earlier age if you have risk factors for colon cancer.  Your health care provider may recommend using home test kits to check for hidden blood in the stool.  A small camera at the end of a tube can be used to examine your colon (sigmoidoscopy or colonoscopy). This checks for the  earliest forms of colorectal cancer.  Prostate and Testicular Cancer  Depending on your age and overall health, your health care provider may do certain tests to screen for prostate and testicular cancer.  Talk to your health care provider about any symptoms or concerns you have about testicular or prostate cancer.  Skin Cancer  Check your skin from head to toe regularly.  Tell your health care provider about any new moles or changes in moles, especially if: ? There is a change in a mole's size, shape, or color. ? You have a mole that is larger than a pencil eraser.  Always use  sunscreen. Apply sunscreen liberally and repeat throughout the day.  Protect yourself by wearing long sleeves, pants, a wide-brimmed hat, and sunglasses when outside.  What should I know about heart disease, diabetes, and high blood pressure?  If you are 70-34 years of age, have your blood pressure checked every 3-5 years. If you are 8 years of age or older, have your blood pressure checked every year. You should have your blood pressure measured twice-once when you are at a hospital or clinic, and once when you are not at a hospital or clinic. Record the average of the two measurements. To check your blood pressure when you are not at a hospital or clinic, you can use: ? An automated blood pressure machine at a pharmacy. ? A home blood pressure monitor.  Talk to your health care provider about your target blood pressure.  If you are between 74-11 years old, ask your health care provider if you should take aspirin to prevent heart disease.  Have regular diabetes screenings by checking your fasting blood sugar level. ? If you are at a normal weight and have a low risk for diabetes, have this test once every three years after the age of 54. ? If you are overweight and have a high risk for diabetes, consider being tested at a younger age or more often.  A one-time screening for abdominal aortic aneurysm (AAA) by ultrasound is recommended for men aged 46-75 years who are current or former smokers. What should I know about preventing infection? Hepatitis B If you have a higher risk for hepatitis B, you should be screened for this virus. Talk with your health care provider to find out if you are at risk for hepatitis B infection. Hepatitis C Blood testing is recommended for:  Everyone born from [birth year] through 1963.  Anyone with known risk factors for hepatitis C.  Sexually Transmitted Diseases (STDs)  You should be screened each year for STDs including gonorrhea and chlamydia if: ? You are  sexually active and are younger than 81 years of age. ? You are older than 81 years of age and your health care provider tells you that you are at risk for this type of infection. ? Your sexual activity has changed since you were last screened and you are at an increased risk for chlamydia or gonorrhea. Ask your health care provider if you are at risk.  Talk with your health care provider about whether you are at high risk of being infected with HIV. Your health care provider may recommend a prescription medicine to help prevent HIV infection.  What else can I do?  Schedule regular health, dental, and eye exams.  Stay current with your vaccines (immunizations).  Do not use any tobacco products, such as cigarettes, chewing tobacco, and e-cigarettes. If you need help quitting, ask your health care provider.  Limit alcohol  intake to no more than 2 drinks per day. One drink equals 12 ounces of beer, 5 ounces of Thelda Gagan, or 1 ounces of hard liquor.  Do not use street drugs.  Do not share needles.  Ask your health care provider for help if you need support or information about quitting drugs.  Tell your health care provider if you often feel depressed.  Tell your health care provider if you have ever been abused or do not feel safe at home. This information is not intended to replace advice given to you by your health care provider. Make sure you discuss any questions you have with your health care provider. Document Released: 10/29/2007 Document Revised: 12/30/2015 Document Reviewed: 02/03/2015 Elsevier Interactive Patient Education  Jared Schein.

## 2018-01-10 ENCOUNTER — Other Ambulatory Visit: Payer: Self-pay | Admitting: Internal Medicine

## 2018-01-10 DIAGNOSIS — E785 Hyperlipidemia, unspecified: Secondary | ICD-10-CM

## 2018-01-23 ENCOUNTER — Other Ambulatory Visit: Payer: Self-pay | Admitting: Internal Medicine

## 2018-01-23 DIAGNOSIS — I10 Essential (primary) hypertension: Secondary | ICD-10-CM

## 2018-01-24 ENCOUNTER — Encounter: Payer: Self-pay | Admitting: Internal Medicine

## 2018-01-24 DIAGNOSIS — I10 Essential (primary) hypertension: Secondary | ICD-10-CM

## 2018-01-25 ENCOUNTER — Other Ambulatory Visit: Payer: Self-pay | Admitting: Internal Medicine

## 2018-01-25 MED ORDER — LOSARTAN POTASSIUM 50 MG PO TABS: 50.0000 mg | ORAL_TABLET | Freq: Every day | ORAL | 1 refills | Status: DC

## 2018-01-30 ENCOUNTER — Ambulatory Visit (INDEPENDENT_AMBULATORY_CARE_PROVIDER_SITE_OTHER): Payer: Medicare Other | Admitting: *Deleted

## 2018-01-30 DIAGNOSIS — I442 Atrioventricular block, complete: Secondary | ICD-10-CM

## 2018-01-30 NOTE — Progress Notes (Signed)
Remote pacemaker transmission.   

## 2018-02-02 ENCOUNTER — Encounter: Payer: Self-pay | Admitting: Internal Medicine

## 2018-02-02 ENCOUNTER — Ambulatory Visit: Payer: Medicare Other | Admitting: Internal Medicine

## 2018-02-02 VITALS — BP 112/70 | HR 84 | Ht 68.0 in | Wt 274.0 lb

## 2018-02-02 DIAGNOSIS — I480 Paroxysmal atrial fibrillation: Secondary | ICD-10-CM | POA: Diagnosis not present

## 2018-02-02 DIAGNOSIS — I10 Essential (primary) hypertension: Secondary | ICD-10-CM

## 2018-02-02 DIAGNOSIS — Z950 Presence of cardiac pacemaker: Secondary | ICD-10-CM | POA: Diagnosis not present

## 2018-02-02 DIAGNOSIS — I442 Atrioventricular block, complete: Secondary | ICD-10-CM

## 2018-02-02 NOTE — Progress Notes (Signed)
HPI Connor Dixon returns today for follow-up. He is an 83 year old man with a history of complete heart block, morbid obesity, hypertension, status post permanent pacemaker insertion. In the interim he has done well although he continues to admit to dietary indiscretion. His weight is down a proximal he 4 pounds from his last visit. He denies syncope or chest pain. He has become more sedentary but does not have any specific shortness of breath. He was bothered by peripheral edema, that had his diuretic dose increased and his edema resolved. Allergies  Allergen Reactions  . Oysters [Shellfish Allergy]     Blisters and swelling  . Apple Other (See Comments)    Sore throat     Current Outpatient Medications  Medication Sig Dispense Refill  . allopurinol (ZYLOPRIM) 100 MG tablet TAKE 1 TABLET(100 MG) BY MOUTH DAILY 90 tablet 0  . amoxicillin (AMOXIL) 500 MG capsule TAKE ONE CAPSULE BY MOUTH THREE TIMES DAILY. TAKE ONLY WHEN HAVING DENTAL PROCEDURES 30 capsule 2  . blood glucose meter kit and supplies KIT Dispense what is covered by patients insurance. 1 each 0  . Blood Glucose Monitoring Suppl (ONETOUCH VERIO) w/Device KIT 1 Device by Does not apply route daily. Use as directed 1 kit 0  . carvedilol (COREG) 6.25 MG tablet TAKE 1 TABLET(6.25 MG) BY MOUTH TWICE DAILY WITH A MEAL 180 tablet 0  . ELIQUIS 2.5 MG TABS tablet TAKE 1 TABLET BY MOUTH TWICE DAILY 60 tablet 5  . glucose blood (ONETOUCH VERIO) test strip Use to check blood sugar up to 4 times per day. DX E11.8 500 each 3  . Insulin Isophane & Regular Human (HUMULIN 70/30 MIX) (70-30) 100 UNIT/ML PEN Inject 65 Units into the skin 2 (two) times daily. 15 mL 11  . Insulin Pen Needle (B-D ULTRAFINE III SHORT PEN) 31G X 8 MM MISC USE TO ADMINISTER INSULIN THREE TIMES DAILY 100 each 11  . Insulin Syringe-Needle U-100 (INSULIN SYRINGE .5CC/31GX5/16") 31G X 5/16" 0.5 ML MISC USE AS DIRECTED FOUR TIMES DAILY 200 each 2  . LANTUS 100 UNIT/ML  injection INJECT 25 UNITS INTO THE SKIN AT BEDTIME 30 mL 1  . LANTUS SOLOSTAR 100 UNIT/ML Solostar Pen ADMINISTER 25 UNITS UNDER THE SKIN DAILY AT 10 PM 15 mL 5  . losartan (COZAAR) 50 MG tablet Take 1 tablet (50 mg total) by mouth daily. 90 tablet 1  . ONETOUCH DELICA LANCETS 59F MISC TEST THREE TIMES DAILY 200 each 11  . pravastatin (PRAVACHOL) 40 MG tablet Take 40 mg by mouth daily.    . pravastatin (PRAVACHOL) 40 MG tablet TAKE 1 TABLET(40 MG) BY MOUTH DAILY 90 tablet 0  . furosemide (LASIX) 40 MG tablet Take 1 tablet (40 mg total) by mouth 2 (two) times daily. 180 tablet 3   No current facility-administered medications for this visit.      Past Medical History:  Diagnosis Date  . Acute on chronic diastolic congestive heart failure ([place])   . Arthus phenomenon   . Atrioventricular block, complete ([place])   . Cardiac pacemaker in situ 07/22/2010  . Chronic kidney disease, stage 3 (HCC)    Presumably from longstanding DM & HTN. Scr has gradually increased over the past 2 years (1.6 in 12/2010, 2 in 03/2011, and up to 2.1 in 01/2013)  . DIABETES MELLITUS, TYPE I, ADULT ONSET 08/06/2007  . DM (diabetes mellitus) ([place])    adult onset   . Essential hypertension, benign 08/06/2007   Very good  BP control  . History of second degree heart block   . HLD (hyperlipidemia)   . HYPERLIPIDEMIA 08/06/2007  . Hypopotassemia   . Morbid obesity ([place])   . Obesity   . Other and unspecified angina pectoris    typical  . Type I (juvenile type) diabetes mellitus without mention of complication, not stated as uncontrolled    (04/25/13 OV with Dr. Marval Regal) = Poorly controlled & is working  w/Dr. Crissie Sickles to improve this. Most recent Hgb A1c was 8.2% but Mr. Bogert reports it was down to 7.6 last month.    ROS:   All systems reviewed and negative except as noted in the HPI.   Past Surgical History:  Procedure Laterality Date  . CATARACT EXTRACTION  8/08   [facility]  . PTVDP  12/11   PPM - [employer]  [facility]  . TOTAL HIP ARTHROPLASTY  04/05/06   right     Family History  Problem Relation Age of Onset  . Asthma Father   . Coronary artery disease Father   . Diabetes Father   . Hyperlipidemia Father   . Stroke Father 34  . Coronary artery disease Mother   . Asthma Mother   . Heart attack Mother 67  . Colon cancer Neg Hx        no definate prostate CA      Social History   Socioeconomic History  . Marital status: Widowed    Spouse name: Not on file  . Number of children: 2  . Years of education: Not on file  . Highest education level: Not on file  Occupational History  . Not on file  Social Needs  . Financial resource strain: Not hard at all  . Food insecurity:    Worry: Never true    Inability: Never true  . Transportation needs:    Medical: No    Non-medical: No  Tobacco Use  . Smoking status: Never Smoker  . Smokeless tobacco: Never Used  Substance and Sexual Activity  . Alcohol use: No  . Drug use: No  . Sexual activity: Not Currently  Lifestyle  . Physical activity:    Days per week: 0 days    Minutes per session: 0 min  . Stress: Only a little  Relationships  . Social connections:    Talks on phone: More than three times a week    Gets together: More than three times a week    Attends religious service: More than 4 times per year    Active member of club or organization: Yes    Attends meetings of clubs or organizations: More than 4 times per year    Relationship status: Widowed  . Intimate partner violence:    Fear of current or ex partner: Not on file    Emotionally abused: Not on file    Physically abused: Not on file    Forced sexual activity: Not on file  Other Topics Concern  . Not on file  Social History Narrative   [employer] BA, 2 Master's degrees. [place] - for certification Education specialist.    Retired Tree surgeon.   Active - travels.   Married - 1961; 2 sons (Aniwa) and 5 grandchildren.        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      BP 112/70   Pulse 84   Ht '5\' 8"'  (1.727 m)   Wt 274 lb (124.3 kg)   SpO2 98%  BMI 41.66 kg/m   Physical Exam:  Well appearing NAD HEENT: Unremarkable Neck:  No JVD, no thyromegally Lymphatics:  No adenopathy Back:  No CVA tenderness Lungs:  Clear with no wheezes HEART:  Regular rate rhythm, no murmurs, no rubs, no clicks Abd:  soft, positive bowel sounds, no organomegally, no rebound, no guarding Ext:  2 plus pulses, no edema, no cyanosis, no clubbing Skin:  No rashes no nodules Neuro:  CN II through XII intact, motor grossly intact  EKG - P synchronous ventricular pacing  DEVICE  Normal device function.  See PaceArt for details.   Assess/Plan: 1. CHB - he is s/p PPM insertion.  2. PPM - his [employer] device has just a bit over a year on the battery. Will recheck in several months.  3. HTN - his blood pressure is well controlled. 4. Obesity - he has lost 4 lbs. He is encouraged to continue to lose weight.  Cristopher Peru, M.D.

## 2018-02-02 NOTE — Patient Instructions (Signed)

## 2018-02-08 ENCOUNTER — Other Ambulatory Visit: Payer: Self-pay | Admitting: Internal Medicine

## 2018-02-09 ENCOUNTER — Other Ambulatory Visit: Payer: Self-pay | Admitting: Internal Medicine

## 2018-02-09 DIAGNOSIS — M1A3790 Chronic gout due to renal impairment, unspecified ankle and foot, without tophus (tophi): Secondary | ICD-10-CM

## 2018-02-09 NOTE — Telephone Encounter (Signed)
Pt will need to schedule a follow up in October with PCP. I sent in a 30 day supply.

## 2018-02-20 LAB — BASIC METABOLIC PANEL
BUN: 35 — AB (ref 4–21)
Creatinine: 1.7 — AB (ref 0.6–1.3)
GLUCOSE: 165
Potassium: 4.2 (ref 3.4–5.3)
Sodium: 139 (ref 137–147)

## 2018-02-20 LAB — CBC AND DIFFERENTIAL
HEMATOCRIT: 44 (ref 41–53)
HEMOGLOBIN: 14.6 (ref 13.5–17.5)
Platelets: 192 (ref 150–399)
WBC: 7.2

## 2018-02-20 LAB — HEPATIC FUNCTION PANEL
ALT: 21 (ref 10–40)
AST: 15 (ref 14–40)

## 2018-02-20 LAB — MICROALBUMIN, URINE: Microalb, Ur: 904

## 2018-03-01 LAB — CUP PACEART REMOTE DEVICE CHECK
Brady Statistic AS VP Percent: 98 %
Brady Statistic AS VS Percent: 1 %
Brady Statistic RA Percent Paced: 1 %
Implantable Lead Implant Date: 20111202
Implantable Lead Location: 753860
Implantable Pulse Generator Implant Date: 20111202
Lead Channel Impedance Value: 410 Ohm
Lead Channel Pacing Threshold Amplitude: 1.5 V
Lead Channel Pacing Threshold Pulse Width: 0.6 ms
Lead Channel Sensing Intrinsic Amplitude: 12 mV
Lead Channel Setting Pacing Amplitude: 2 V
Lead Channel Setting Pacing Amplitude: 2.5 V
Lead Channel Setting Pacing Pulse Width: 0.8 ms
Lead Channel Setting Sensing Sensitivity: 4 mV
MDC IDC LEAD IMPLANT DT: 20111202
MDC IDC LEAD LOCATION: 753859
MDC IDC MSMT BATTERY REMAINING LONGEVITY: 16 mo
MDC IDC MSMT BATTERY REMAINING PERCENTAGE: 17 %
MDC IDC MSMT BATTERY VOLTAGE: 2.75 V
MDC IDC MSMT LEADCHNL RA IMPEDANCE VALUE: 450 Ohm
MDC IDC MSMT LEADCHNL RA PACING THRESHOLD AMPLITUDE: 0.75 V
MDC IDC MSMT LEADCHNL RA PACING THRESHOLD PULSEWIDTH: 0.4 ms
MDC IDC MSMT LEADCHNL RA SENSING INTR AMPL: 1.8 mV
MDC IDC SESS DTM: 20190917073229
MDC IDC STAT BRADY AP VP PERCENT: 1.2 %
MDC IDC STAT BRADY AP VS PERCENT: 1 %
MDC IDC STAT BRADY RV PERCENT PACED: 99 %
Pulse Gen Model: 2210
Pulse Gen Serial Number: 7192434

## 2018-03-11 ENCOUNTER — Other Ambulatory Visit: Payer: Self-pay | Admitting: Internal Medicine

## 2018-03-14 ENCOUNTER — Encounter: Payer: Self-pay | Admitting: Internal Medicine

## 2018-03-14 ENCOUNTER — Ambulatory Visit: Payer: Medicare Other | Admitting: Internal Medicine

## 2018-03-14 ENCOUNTER — Other Ambulatory Visit (INDEPENDENT_AMBULATORY_CARE_PROVIDER_SITE_OTHER): Payer: Medicare Other

## 2018-03-14 VITALS — BP 122/62 | HR 77 | Temp 98.0°F | Resp 16 | Ht 68.0 in | Wt 277.0 lb

## 2018-03-14 DIAGNOSIS — Z0000 Encounter for general adult medical examination without abnormal findings: Secondary | ICD-10-CM

## 2018-03-14 DIAGNOSIS — E785 Hyperlipidemia, unspecified: Secondary | ICD-10-CM | POA: Diagnosis not present

## 2018-03-14 DIAGNOSIS — N183 Chronic kidney disease, stage 3 unspecified: Secondary | ICD-10-CM

## 2018-03-14 DIAGNOSIS — I10 Essential (primary) hypertension: Secondary | ICD-10-CM | POA: Diagnosis not present

## 2018-03-14 DIAGNOSIS — E0840 Diabetes mellitus due to underlying condition with diabetic neuropathy, unspecified: Secondary | ICD-10-CM | POA: Diagnosis not present

## 2018-03-14 LAB — POCT GLUCOSE (DEVICE FOR HOME USE): Glucose Fasting, POC: 200 mg/dL — AB (ref 70–99)

## 2018-03-14 LAB — LIPID PANEL
CHOL/HDL RATIO: 3
Cholesterol: 142 mg/dL (ref 0–200)
HDL: 47.1 mg/dL (ref >39.00)
LDL Cholesterol: 65 mg/dL (ref 0–99)
NONHDL: 95.37
Triglycerides: 154 mg/dL — ABNORMAL HIGH (ref 0.0–149.0)
VLDL: 30.8 mg/dL (ref 0.0–40.0)

## 2018-03-14 LAB — POCT GLYCOSYLATED HEMOGLOBIN (HGB A1C): HEMOGLOBIN A1C: 6.9 % — AB (ref 4.0–5.6)

## 2018-03-14 LAB — TSH: TSH: 1.45 u[IU]/mL (ref 0.35–4.50)

## 2018-03-14 MED ORDER — INSULIN GLARGINE 100 UNIT/ML ~~LOC~~ SOLN: SUBCUTANEOUS | 3 refills | Status: DC

## 2018-03-14 MED ORDER — INSULIN NPH ISOPHANE & REGULAR (70-30) 100 UNIT/ML ~~LOC~~ SUSP: SUBCUTANEOUS | 3 refills | Status: DC

## 2018-03-14 NOTE — Progress Notes (Signed)
Subjective:  Patient ID: Connor Dixon, male    DOB: [birth year]  Age: 83 y.o. MRN: [id number]  CC: Annual Exam; Hypertension; Hyperlipidemia; and Diabetes   HPI JACORIE ERNSBERGER presents for f/up - He tells me his blood pressure has been well controlled.  He denies any recent episodes of CP, DOE, palpitations, edema, or fatigue.  He also tells me his blood sugars have been well controlled.  He denies polys.  Outpatient Medications Prior to Visit  Medication Sig Dispense Refill  . amoxicillin (AMOXIL) 500 MG capsule TAKE ONE CAPSULE BY MOUTH THREE TIMES DAILY. TAKE ONLY WHEN HAVING DENTAL PROCEDURES 30 capsule 2  . blood glucose meter kit and supplies KIT Dispense what is covered by patients insurance. 1 each 0  . Blood Glucose Monitoring Suppl (ONETOUCH VERIO) w/Device KIT 1 Device by Does not apply route daily. Use as directed 1 kit 0  . calcitRIOL (ROCALTROL) 0.25 MCG capsule Take 1 capsule by mouth every other day.  5  . carvedilol (COREG) 6.25 MG tablet TAKE 1 TABLET(6.25 MG) BY MOUTH TWICE DAILY WITH A MEAL 180 tablet 0  . ELIQUIS 2.5 MG TABS tablet TAKE 1 TABLET BY MOUTH TWICE DAILY 60 tablet 5  . glucose blood (ONETOUCH VERIO) test strip Use to check blood sugar up to 4 times per day. DX E11.8 500 each 3  . Insulin Pen Needle (B-D ULTRAFINE III SHORT PEN) 31G X 8 MM MISC USE TO ADMINISTER INSULIN THREE TIMES DAILY 100 each 11  . Insulin Syringe-Needle U-100 (INSULIN SYRINGE .5CC/31GX5/16") 31G X 5/16" 0.5 ML MISC USE AS DIRECTED FOUR TIMES DAILY 200 each 2  . losartan (COZAAR) 50 MG tablet Take 1 tablet (50 mg total) by mouth daily. 90 tablet 1  . ONETOUCH DELICA LANCETS 29N MISC TEST THREE TIMES DAILY 200 each 11  . pravastatin (PRAVACHOL) 40 MG tablet TAKE 1 TABLET(40 MG) BY MOUTH DAILY 90 tablet 0  . allopurinol (ZYLOPRIM) 100 MG tablet TAKE 1 TABLET(100 MG) BY MOUTH DAILY 90 tablet 0  . HUMULIN 70/30 (70-30) 100 UNIT/ML injection ADMINISTER 65 UNITS UNDER THE SKIN DAILY WITH  BREAKFAST 20 mL 3  . Insulin Isophane & Regular Human (HUMULIN 70/30 MIX) (70-30) 100 UNIT/ML PEN Inject 65 Units into the skin 2 (two) times daily. 15 mL 11  . LANTUS 100 UNIT/ML injection INJECT 25 UNITS INTO THE SKIN AT BEDTIME 30 mL 1  . LANTUS SOLOSTAR 100 UNIT/ML Solostar Pen ADMINISTER 25 UNITS UNDER THE SKIN DAILY AT 10 PM 15 mL 5  . furosemide (LASIX) 40 MG tablet Take 1 tablet (40 mg total) by mouth 2 (two) times daily. 180 tablet 3   No facility-administered medications prior to visit.     ROS Review of Systems  Constitutional: Negative for appetite change, diaphoresis, fatigue and unexpected weight change.  HENT: Negative.   Eyes: Negative for visual disturbance.  Respiratory: Negative for cough, chest tightness, shortness of breath and wheezing.   Cardiovascular: Negative for chest pain, palpitations and leg swelling.  Gastrointestinal: Negative for abdominal pain, constipation, diarrhea, nausea and vomiting.  Endocrine: Negative for polydipsia, polyphagia and polyuria.  Genitourinary: Negative.  Negative for difficulty urinating.  Musculoskeletal: Negative.  Negative for arthralgias and myalgias.  Skin: Negative.  Negative for color change, pallor and rash.  Neurological: Negative.  Negative for dizziness, weakness, light-headedness and headaches.  Hematological: Negative for adenopathy. Does not bruise/bleed easily.  Psychiatric/Behavioral: Negative.     Objective:  BP 122/62 (BP Location: Left Arm,  Patient Position: Sitting, Cuff Size: Large)   Pulse 77   Temp 98 F (36.7 C) (Oral)   Resp 16   Ht _0  (1.727 m)   Wt 277 lb (125.6 kg)   SpO2 95%   BMI 42.12 kg/m   BP Readings from Last 3 Encounters:  03/14/18 122/62  02/02/18 112/70  01/09/18 130/64    Wt Readings from Last 3 Encounters:  03/14/18 277 lb (125.6 kg)  02/02/18 274 lb (124.3 kg)  01/09/18 276 lb (125.2 kg)    Physical Exam  Constitutional: He is oriented to person, place, and time. No  distress.  HENT:  Mouth/Throat: Oropharynx is clear and moist. No oropharyngeal exudate.  Eyes: Conjunctivae are normal. No scleral icterus.  Neck: Normal range of motion. Neck supple. No JVD present. No thyromegaly present.  Cardiovascular: Normal rate and regular rhythm. Exam reveals no gallop.  Murmur heard.  Decrescendo systolic murmur is present with a grade of 1/6.  No diastolic murmur is present. Pulmonary/Chest: Effort normal and breath sounds normal. No respiratory distress. He has no wheezes. He has no rales.  Abdominal: Soft. Bowel sounds are normal. He exhibits no mass. There is no tenderness.  Genitourinary:  Genitourinary Comments: GU and rectal exams were deferred at his request.  Musculoskeletal: Normal range of motion. He exhibits no edema, tenderness or deformity.  Lymphadenopathy:    He has no cervical adenopathy.  Neurological: He is alert and oriented to person, place, and time.  Skin: Skin is dry. No rash noted. He is not diaphoretic.  Vitals reviewed.   Lab Results  Component Value Date   WBC 7.2 02/20/2018   HGB 14.6 02/20/2018   HCT 44 02/20/2018   PLT 192 02/20/2018   GLUCOSE 298 (H) 09/07/2017   CHOL 142 03/14/2018   TRIG 154.0 (H) 03/14/2018   HDL 47.10 03/14/2018   LDLCALC 65 03/14/2018   ALT 21 02/20/2018   AST 15 02/20/2018   NA 139 02/20/2018   K 4.2 02/20/2018   CL 100 09/07/2017   CREATININE 1.7 (A) 02/20/2018   BUN 35 (A) 02/20/2018   CO2 30 09/07/2017   TSH 1.45 03/14/2018   PSA 1.77 01/06/2010   HGBA1C 6.9 (A) 03/14/2018   MICROALBUR 904 02/20/2018    No results found.  Assessment & Plan:   Cullen was seen today for annual exam, hypertension, hyperlipidemia and diabetes.  Diagnoses and all orders for this visit:  Essential hypertension- His blood pressure is well controlled.  His renal function is stable and recent electrolytes were normal. -     TSH; Future  Diabetes mellitus due to underlying condition with diabetic  neuropathy, without long-term current use of insulin ([place])- His A1c is at 6.9%.  His blood sugars are adequately well controlled. -     HM Diabetes Foot Exam -     insulin glargine (LANTUS) 100 UNIT/ML injection; INJECT 25 UNITS INTO THE SKIN AT BEDTIME -     insulin NPH-regular Human (HUMULIN 70/30) (70-30) 100 UNIT/ML injection; ADMINISTER 65 UNITS UNDER THE SKIN DAILY WITH BREAKFAST -     POCT glycosylated hemoglobin (Hb A1C) -     POCT Glucose (Device for Home Use)  Kidney disease, chronic, stage III (GFR 30-59 ml/min) (HCC)- His renal function is stable.  He will avoid nephrotoxic agents.  Will continue to maintain good control of his blood pressure and his blood sugars.  Hyperlipidemia with target LDL less than 70- He has achieved his LDL goal and is  doing well on the statin. -     Lipid panel; Future -     TSH; Future  Routine health maintenance   I have discontinued Mallie Mussel C. Wickliff's Insulin Isophane & Regular Human and LANTUS SOLOSTAR. I have also changed his LANTUS to insulin glargine and HUMULIN 70/30 to insulin NPH-regular Human. Additionally, I am having him maintain his amoxicillin, furosemide, ONETOUCH DELICA LANCETS 24M, blood glucose meter kit and supplies, ONETOUCH VERIO, Insulin Pen Needle, glucose blood, INSULIN SYRINGE .5CC/31GX5/16", pravastatin, carvedilol, losartan, calcitRIOL, and ELIQUIS.  Meds ordered this encounter  Medications  . insulin glargine (LANTUS) 100 UNIT/ML injection    Sig: INJECT 25 UNITS INTO THE SKIN AT BEDTIME    Dispense:  30 mL    Refill:  3  . insulin NPH-regular Human (HUMULIN 70/30) (70-30) 100 UNIT/ML injection    Sig: ADMINISTER 65 UNITS UNDER THE SKIN DAILY WITH BREAKFAST    Dispense:  20 mL    Refill:  3     Follow-up: Return in about 6 months (around 09/13/2018).  Scarlette Calico, MD

## 2018-03-14 NOTE — Patient Instructions (Signed)

## 2018-03-15 ENCOUNTER — Other Ambulatory Visit: Payer: Self-pay | Admitting: Internal Medicine

## 2018-03-15 DIAGNOSIS — M1A3790 Chronic gout due to renal impairment, unspecified ankle and foot, without tophus (tophi): Secondary | ICD-10-CM

## 2018-04-10 ENCOUNTER — Other Ambulatory Visit: Payer: Self-pay | Admitting: Internal Medicine

## 2018-04-10 DIAGNOSIS — E785 Hyperlipidemia, unspecified: Secondary | ICD-10-CM

## 2018-04-30 ENCOUNTER — Other Ambulatory Visit: Payer: Self-pay | Admitting: Internal Medicine

## 2018-05-01 ENCOUNTER — Ambulatory Visit (INDEPENDENT_AMBULATORY_CARE_PROVIDER_SITE_OTHER): Payer: Medicare Other

## 2018-05-01 DIAGNOSIS — I442 Atrioventricular block, complete: Secondary | ICD-10-CM | POA: Diagnosis not present

## 2018-05-01 NOTE — Progress Notes (Signed)
Remote pacemaker transmission.   

## 2018-05-02 ENCOUNTER — Encounter: Payer: Self-pay | Admitting: Cardiology

## 2018-05-24 ENCOUNTER — Other Ambulatory Visit: Payer: Self-pay | Admitting: Internal Medicine

## 2018-05-24 DIAGNOSIS — E0840 Diabetes mellitus due to underlying condition with diabetic neuropathy, unspecified: Secondary | ICD-10-CM

## 2018-06-02 LAB — CUP PACEART REMOTE DEVICE CHECK
Battery Remaining Longevity: 10 mo
Battery Remaining Percentage: 10 %
Battery Voltage: 2.71 V
Brady Statistic AP VP Percent: 1 %
Brady Statistic AP VS Percent: 1 %
Brady Statistic AS VP Percent: 98 %
Brady Statistic AS VS Percent: 1 %
Brady Statistic RV Percent Paced: 99 %
Date Time Interrogation Session: 20191217124314
Implantable Lead Implant Date: 20111202
Implantable Lead Location: 753859
Implantable Lead Location: 753860
Implantable Pulse Generator Implant Date: 20111202
Lead Channel Impedance Value: 390 Ohm
Lead Channel Impedance Value: 430 Ohm
Lead Channel Pacing Threshold Amplitude: 0.75 V
Lead Channel Pacing Threshold Amplitude: 1 V
Lead Channel Pacing Threshold Pulse Width: 0.4 ms
Lead Channel Sensing Intrinsic Amplitude: 1.5 mV
Lead Channel Setting Pacing Amplitude: 2.5 V
Lead Channel Setting Pacing Pulse Width: 0.8 ms
Lead Channel Setting Sensing Sensitivity: 4 mV
MDC IDC LEAD IMPLANT DT: 20111202
MDC IDC MSMT LEADCHNL RV PACING THRESHOLD PULSEWIDTH: 0.8 ms
MDC IDC MSMT LEADCHNL RV SENSING INTR AMPL: 12 mV
MDC IDC SET LEADCHNL RA PACING AMPLITUDE: 2 V
MDC IDC STAT BRADY RA PERCENT PACED: 1 %
Pulse Gen Model: 2210
Pulse Gen Serial Number: 7192434

## 2018-06-08 ENCOUNTER — Other Ambulatory Visit: Payer: Self-pay | Admitting: Internal Medicine

## 2018-06-08 DIAGNOSIS — M1A3790 Chronic gout due to renal impairment, unspecified ankle and foot, without tophus (tophi): Secondary | ICD-10-CM

## 2018-06-26 ENCOUNTER — Other Ambulatory Visit: Payer: Self-pay | Admitting: Internal Medicine

## 2018-06-26 DIAGNOSIS — E0840 Diabetes mellitus due to underlying condition with diabetic neuropathy, unspecified: Secondary | ICD-10-CM

## 2018-07-08 ENCOUNTER — Other Ambulatory Visit: Payer: Self-pay | Admitting: Internal Medicine

## 2018-07-08 DIAGNOSIS — E785 Hyperlipidemia, unspecified: Secondary | ICD-10-CM

## 2018-07-20 ENCOUNTER — Other Ambulatory Visit: Payer: Self-pay | Admitting: Internal Medicine

## 2018-07-20 DIAGNOSIS — I10 Essential (primary) hypertension: Secondary | ICD-10-CM

## 2018-07-25 ENCOUNTER — Other Ambulatory Visit: Payer: Self-pay | Admitting: Internal Medicine

## 2018-07-31 ENCOUNTER — Other Ambulatory Visit: Payer: Self-pay

## 2018-07-31 ENCOUNTER — Ambulatory Visit (INDEPENDENT_AMBULATORY_CARE_PROVIDER_SITE_OTHER): Payer: Medicare Other | Admitting: *Deleted

## 2018-07-31 DIAGNOSIS — I442 Atrioventricular block, complete: Secondary | ICD-10-CM | POA: Diagnosis not present

## 2018-08-02 ENCOUNTER — Other Ambulatory Visit: Payer: Self-pay | Admitting: Internal Medicine

## 2018-08-02 LAB — CUP PACEART REMOTE DEVICE CHECK
Battery Remaining Longevity: 5 mo
Battery Remaining Percentage: 5 %
Battery Voltage: 2.66 V
Brady Statistic AP VP Percent: 1 %
Brady Statistic AP VS Percent: 1 %
Brady Statistic AS VP Percent: 99 %
Brady Statistic AS VS Percent: 1 %
Brady Statistic RA Percent Paced: 1 %
Brady Statistic RV Percent Paced: 99 %
Date Time Interrogation Session: 20200317105852
Implantable Lead Implant Date: 20111202
Implantable Lead Implant Date: 20111202
Implantable Lead Location: 753859
Implantable Lead Location: 753860
Implantable Pulse Generator Implant Date: 20111202
Lead Channel Impedance Value: 390 Ohm
Lead Channel Impedance Value: 440 Ohm
Lead Channel Pacing Threshold Amplitude: 0.75 V
Lead Channel Pacing Threshold Amplitude: 1 V
Lead Channel Pacing Threshold Pulse Width: 0.4 ms
Lead Channel Pacing Threshold Pulse Width: 0.8 ms
Lead Channel Sensing Intrinsic Amplitude: 1.7 mV
Lead Channel Sensing Intrinsic Amplitude: 9.1 mV
Lead Channel Setting Pacing Amplitude: 2 V
Lead Channel Setting Pacing Amplitude: 2.5 V
Lead Channel Setting Pacing Pulse Width: 0.8 ms
Lead Channel Setting Sensing Sensitivity: 4 mV
Pulse Gen Model: 2210
Pulse Gen Serial Number: 7192434

## 2018-08-07 NOTE — Progress Notes (Signed)
Remote pacemaker transmission.   

## 2018-09-24 LAB — HM DIABETES EYE EXAM

## 2018-10-09 LAB — BASIC METABOLIC PANEL
BUN: 38 — AB (ref 4–21)
Creatinine: 1.7 — AB (ref 0.6–1.3)
Glucose: 115
Potassium: 3.8 (ref 3.4–5.3)
Sodium: 139 (ref 137–147)

## 2018-10-09 LAB — HEPATIC FUNCTION PANEL
ALT: 16 (ref 10–40)
AST: 23 (ref 14–40)
Alkaline Phosphatase: 111 (ref 25–125)
Bilirubin, Total: 0.8

## 2018-10-09 LAB — CBC AND DIFFERENTIAL
HCT: 44 (ref 41–53)
Hemoglobin: 14.8 (ref 13.5–17.5)
Platelets: 194 (ref 150–399)
WBC: 7.3

## 2018-10-30 ENCOUNTER — Ambulatory Visit (INDEPENDENT_AMBULATORY_CARE_PROVIDER_SITE_OTHER): Payer: Medicare Other | Admitting: *Deleted

## 2018-10-30 DIAGNOSIS — I442 Atrioventricular block, complete: Secondary | ICD-10-CM | POA: Diagnosis not present

## 2018-10-30 LAB — CUP PACEART REMOTE DEVICE CHECK
Battery Remaining Longevity: 1 mo
Battery Remaining Percentage: 0.5 %
Battery Voltage: 2.62 V
Brady Statistic AP VP Percent: 1.1 %
Brady Statistic AP VS Percent: 1 %
Brady Statistic AS VP Percent: 98 %
Brady Statistic AS VS Percent: 1 %
Brady Statistic RA Percent Paced: 1 %
Brady Statistic RV Percent Paced: 99 %
Date Time Interrogation Session: 20200616071155
Implantable Lead Implant Date: 20111202
Implantable Lead Implant Date: 20111202
Implantable Lead Location: 753859
Implantable Lead Location: 753860
Implantable Pulse Generator Implant Date: 20111202
Lead Channel Impedance Value: 390 Ohm
Lead Channel Impedance Value: 440 Ohm
Lead Channel Pacing Threshold Amplitude: 0.75 V
Lead Channel Pacing Threshold Amplitude: 1 V
Lead Channel Pacing Threshold Pulse Width: 0.4 ms
Lead Channel Pacing Threshold Pulse Width: 0.8 ms
Lead Channel Sensing Intrinsic Amplitude: 1.9 mV
Lead Channel Sensing Intrinsic Amplitude: 9.3 mV
Lead Channel Setting Pacing Amplitude: 2 V
Lead Channel Setting Pacing Amplitude: 2.5 V
Lead Channel Setting Pacing Pulse Width: 0.8 ms
Lead Channel Setting Sensing Sensitivity: 4 mV
Pulse Gen Model: 2210
Pulse Gen Serial Number: 7192434

## 2018-11-08 ENCOUNTER — Encounter: Payer: Self-pay | Admitting: Cardiology

## 2018-11-08 NOTE — Progress Notes (Signed)
Remote pacemaker transmission.   

## 2018-11-26 ENCOUNTER — Encounter: Payer: Self-pay | Admitting: Internal Medicine

## 2018-11-26 ENCOUNTER — Other Ambulatory Visit (INDEPENDENT_AMBULATORY_CARE_PROVIDER_SITE_OTHER): Payer: Medicare Other

## 2018-11-26 ENCOUNTER — Ambulatory Visit (INDEPENDENT_AMBULATORY_CARE_PROVIDER_SITE_OTHER): Payer: Medicare Other | Admitting: Internal Medicine

## 2018-11-26 ENCOUNTER — Other Ambulatory Visit: Payer: Self-pay

## 2018-11-26 VITALS — BP 140/80 | HR 74 | Temp 98.0°F | Resp 16 | Ht 68.0 in | Wt 285.0 lb

## 2018-11-26 DIAGNOSIS — E785 Hyperlipidemia, unspecified: Secondary | ICD-10-CM

## 2018-11-26 DIAGNOSIS — N183 Chronic kidney disease, stage 3 unspecified: Secondary | ICD-10-CM

## 2018-11-26 DIAGNOSIS — M10071 Idiopathic gout, right ankle and foot: Secondary | ICD-10-CM | POA: Diagnosis not present

## 2018-11-26 DIAGNOSIS — M1A3790 Chronic gout due to renal impairment, unspecified ankle and foot, without tophus (tophi): Secondary | ICD-10-CM

## 2018-11-26 DIAGNOSIS — E0840 Diabetes mellitus due to underlying condition with diabetic neuropathy, unspecified: Secondary | ICD-10-CM | POA: Diagnosis not present

## 2018-11-26 LAB — HEMOGLOBIN A1C: Hgb A1c MFr Bld: 7.6 % — ABNORMAL HIGH (ref 4.6–6.5)

## 2018-11-26 LAB — LIPID PANEL
Cholesterol: 135 mg/dL (ref 0–200)
HDL: 45.6 mg/dL (ref >39.00)
LDL Cholesterol: 73 mg/dL (ref 0–99)
NonHDL: 89.51
Total CHOL/HDL Ratio: 3
Triglycerides: 82 mg/dL (ref 0.0–149.0)
VLDL: 16.4 mg/dL (ref 0.0–40.0)

## 2018-11-26 LAB — URIC ACID: Uric Acid, Serum: 6.8 mg/dL (ref 4.0–7.8)

## 2018-11-26 MED ORDER — ALLOPURINOL 100 MG PO TABS: 100.0000 mg | ORAL_TABLET | Freq: Every day | ORAL | 1 refills | Status: DC

## 2018-11-26 MED ORDER — CANAGLIFLOZIN 100 MG PO TABS: 100.0000 mg | ORAL_TABLET | Freq: Every day | ORAL | 1 refills | Status: DC

## 2018-11-26 NOTE — Progress Notes (Signed)
Subjective:  Patient ID: Connor Dixon, male    DOB: [birth year]  Age: 84 y.o. MRN: [id number]  CC: Hypertension, Hyperlipidemia, and Diabetes   HPI Connor Dixon presents for f/up - He complains that his blood sugar is not well controlled.  He has not been working on his lifestyle modifications.  He complains of weight gain but denies polys.  He tells me he is trying to control the blood sugars with insulin.  Outpatient Medications Prior to Visit  Medication Sig Dispense Refill   amoxicillin (AMOXIL) 500 MG capsule TAKE ONE CAPSULE BY MOUTH THREE TIMES DAILY. TAKE ONLY WHEN HAVING DENTAL PROCEDURES 30 capsule 2   blood glucose meter kit and supplies KIT Dispense what is covered by patients insurance. 1 each 0   Blood Glucose Monitoring Suppl (ONETOUCH VERIO) w/Device KIT 1 Device by Does not apply route daily. Use as directed 1 kit 0   calcitRIOL (ROCALTROL) 0.25 MCG capsule Take 1 capsule by mouth every other day.  5   carvedilol (COREG) 6.25 MG tablet TAKE 1 TABLET(6.25 MG) BY MOUTH TWICE DAILY WITH A MEAL 180 tablet 1   ELIQUIS 2.5 MG TABS tablet TAKE 1 TABLET BY MOUTH TWICE DAILY 60 tablet 5   glucose blood test strip USE TO CHECK BLOOD SUGAR UP TO FOUR TIMES DAILY 500 each 3   insulin glargine (LANTUS) 100 UNIT/ML injection INJECT 25 UNITS INTO THE SKIN AT BEDTIME 30 mL 3   insulin NPH-regular Human (HUMULIN 70/30) (70-30) 100 UNIT/ML injection ADMINISTER 65 UNITS UNDER THE SKIN DAILY WITH BREAKFAST 20 mL 3   Insulin Pen Needle (B-D ULTRAFINE III SHORT PEN) 31G X 8 MM MISC USE TO ADMINISTER INSULIN THREE TIMES DAILY 100 each 11   Insulin Syringe-Needle U-100 (INSULIN SYRINGE .5CC/31GX5/16") 31G X 5/16" 0.5 ML MISC USE AS DIRECTED FOUR TIMES DAILY 200 each 2   losartan (COZAAR) 50 MG tablet TAKE 1 TABLET(50 MG) BY MOUTH DAILY 90 tablet 1   ONETOUCH DELICA LANCETS 66Y MISC TEST THREE TIMES DAILY 200 each 11   pravastatin (PRAVACHOL) 40 MG tablet TAKE 1 TABLET(40 MG) BY  MOUTH DAILY 90 tablet 1   allopurinol (ZYLOPRIM) 100 MG tablet Take 1 tablet (100 mg total) by mouth daily. 90 tablet 1   furosemide (LASIX) 40 MG tablet Take 1 tablet (40 mg total) by mouth 2 (two) times daily. 180 tablet 3   No facility-administered medications prior to visit.     ROS Review of Systems  Constitutional: Positive for unexpected weight change (wt gain). Negative for diaphoresis and fatigue.  HENT: Negative.   Eyes: Negative.   Respiratory: Negative.  Negative for cough, chest tightness, shortness of breath and wheezing.   Cardiovascular: Negative for chest pain, palpitations and leg swelling.  Gastrointestinal: Negative for abdominal pain, constipation, diarrhea, nausea and vomiting.  Endocrine: Negative for cold intolerance, heat intolerance, polydipsia, polyphagia and polyuria.  Genitourinary: Negative.  Negative for difficulty urinating, dysuria, frequency and hematuria.  Musculoskeletal: Negative.  Negative for arthralgias and myalgias.  Skin: Negative.  Negative for color change and pallor.  Neurological: Negative.  Negative for dizziness, weakness, light-headedness and headaches.  Hematological: Negative for adenopathy. Does not bruise/bleed easily.  Psychiatric/Behavioral: Negative.     Objective:  BP 140/80 (BP Location: Left Arm, Patient Position: Sitting, Cuff Size: Large)    Pulse 74    Temp 98 F (36.7 C) (Oral)    Resp 16    Ht '5\' 8"'  (1.727 m)    Wt  285 lb (129.3 kg)    SpO2 95%    BMI 43.33 kg/m   BP Readings from Last 3 Encounters:  11/26/18 140/80  03/14/18 122/62  02/02/18 112/70    Wt Readings from Last 3 Encounters:  11/26/18 285 lb (129.3 kg)  03/14/18 277 lb (125.6 kg)  02/02/18 274 lb (124.3 kg)    Physical Exam Vitals signs reviewed.  Constitutional:      Appearance: He is obese. He is not ill-appearing.  HENT:     Nose: Nose normal.  Eyes:     General: No scleral icterus.    Conjunctiva/sclera: Conjunctivae normal.  Neck:      Musculoskeletal: No neck rigidity or muscular tenderness.  Cardiovascular:     Rate and Rhythm: Normal rate and regular rhythm.     Heart sounds: Murmur present. Systolic murmur present with a grade of 2/6. No diastolic murmur. No gallop.   Pulmonary:     Effort: Pulmonary effort is normal.     Breath sounds: No stridor. No wheezing, rhonchi or rales.  Abdominal:     General: Abdomen is protuberant. Bowel sounds are normal. There is no distension.     Palpations: Abdomen is soft. There is no hepatomegaly, splenomegaly or mass.     Tenderness: There is no abdominal tenderness.     Hernia: No hernia is present.  Musculoskeletal:     Right lower leg: No edema.     Left lower leg: No edema.  Lymphadenopathy:     Cervical: No cervical adenopathy.  Skin:    General: Skin is warm.     Coloration: Skin is not pale.  Neurological:     General: No focal deficit present.     Mental Status: He is alert.  Psychiatric:        Mood and Affect: Mood normal.        Behavior: Behavior normal.     Lab Results  Component Value Date   WBC 7.3 10/09/2018   HGB 14.8 10/09/2018   HCT 44 10/09/2018   PLT 194 10/09/2018   GLUCOSE 298 (H) 09/07/2017   CHOL 135 11/26/2018   TRIG 82.0 11/26/2018   HDL 45.60 11/26/2018   LDLCALC 73 11/26/2018   ALT 16 10/09/2018   AST 23 10/09/2018   NA 139 10/09/2018   K 3.8 10/09/2018   CL 100 09/07/2017   CREATININE 1.7 (A) 10/09/2018   BUN 38 (A) 10/09/2018   CO2 30 09/07/2017   TSH 1.45 03/14/2018   PSA 1.77 01/06/2010   HGBA1C 7.6 (H) 11/26/2018   MICROALBUR 904 02/20/2018    No results found.  Assessment & Plan:   Connor Dixon was seen today for hypertension, hyperlipidemia and diabetes.  Diagnoses and all orders for this visit:  Chronic kidney disease, stage III (moderate) ([place])- I have asked him to start taking 100 mg of Invokana each day to reduce the risk of complications from and progression of diabetic kidney disease. -     Discontinue:  canagliflozin (INVOKANA) 100 MG TABS tablet; Take 1 tablet (100 mg total) by mouth daily before breakfast.  Diabetes mellitus due to underlying condition with diabetic neuropathy, without long-term current use of insulin ([place])- His blood sugars are not adequately well controlled.  He agrees to improve his lifestyle modifications.  Will continue the current insulin regimen and I have added an SGLT2 inhibitor. -     Hemoglobin A1c; Future -     Discontinue: canagliflozin (INVOKANA) 100 MG TABS tablet; Take 1  tablet (100 mg total) by mouth daily before breakfast.  Idiopathic gout of right foot, unspecified chronicity- He has achieved his uric acid goal.  Will continue the current dose of allopurinol. -     Uric acid; Future  Hyperlipidemia with target LDL less than 70- He has achieved his LDL goal and is doing well on the statin. -     Lipid panel; Future  Chronic gout due to renal impairment involving foot without tophus, unspecified laterality -     Discontinue: allopurinol (ZYLOPRIM) 100 MG tablet; Take 1 tablet (100 mg total) by mouth daily.   I have discontinued Connor Dixon's allopurinol. I am also having him maintain his amoxicillin, furosemide, OneTouch Delica Lancets 88K, blood glucose meter kit and supplies, OneTouch Verio, Insulin Pen Needle, calcitRIOL, INSULIN SYRINGE .5CC/31GX5/16", insulin glargine, insulin NPH-regular Human, carvedilol, pravastatin, losartan, glucose blood, and Eliquis.  Meds ordered this encounter  Medications   DISCONTD: canagliflozin (INVOKANA) 100 MG TABS tablet    Sig: Take 1 tablet (100 mg total) by mouth daily before breakfast.    Dispense:  90 tablet    Refill:  1   DISCONTD: allopurinol (ZYLOPRIM) 100 MG tablet    Sig: Take 1 tablet (100 mg total) by mouth daily.    Dispense:  90 tablet    Refill:  1     Follow-up: Return in about 4 months (around 03/29/2019).  Scarlette Calico, MD

## 2018-11-26 NOTE — Patient Instructions (Signed)
Type 2 Diabetes Mellitus, Diagnosis, Adult Type 2 diabetes (type 2 diabetes mellitus) is a long-term (chronic) disease. In type 2 diabetes, one or both of these problems may be present:  The pancreas does not make enough of a hormone called insulin.  Cells in the body do not respond properly to insulin that the body makes (insulin resistance). Normally, insulin allows blood sugar (glucose) to enter cells in the body. The cells use glucose for energy. Insulin resistance or lack of insulin causes excess glucose to build up in the blood instead of going into cells. As a result, high blood glucose (hyperglycemia) develops. What increases the risk? The following factors may make you more likely to develop type 2 diabetes:  Having a family member with type 2 diabetes.  Being overweight or obese.  Having an inactive (sedentary) lifestyle.  Having been diagnosed with insulin resistance.  Having a history of prediabetes, gestational diabetes, or polycystic ovary syndrome (PCOS).  Being of American-Indian, African-American, Hispanic/Latino, or Asian/Pacific Islander descent. What are the signs or symptoms? In the early stage of this condition, you may not have symptoms. Symptoms develop slowly and may include:  Increased thirst (polydipsia).  Increased hunger(polyphagia).  Increased urination (polyuria).  Increased urination during the night (nocturia).  Unexplained weight loss.  Frequent infections that keep coming back (recurring).  Fatigue.  Weakness.  Vision changes, such as blurry vision.  Cuts or bruises that are slow to heal.  Tingling or numbness in the hands or feet.  Dark patches on the skin (acanthosis nigricans). How is this diagnosed? This condition is diagnosed based on your symptoms, your medical history, a physical exam, and your blood glucose level. Your blood glucose may be checked with one or more of the following blood tests:  A fasting blood glucose (FBG)  test. You will not be allowed to eat (you will fast) for 8 hours or longer before a blood sample is taken.  A random blood glucose test. This test checks blood glucose at any time of day regardless of when you ate.  An A1c (hemoglobin A1c) blood test. This test provides information about blood glucose control over the previous 2-3 months.  An oral glucose tolerance test (OGTT). This test measures your blood glucose at two times: ? After fasting. This is your baseline blood glucose level. ? Two hours after drinking a beverage that contains glucose. You may be diagnosed with type 2 diabetes if:  Your FBG level is 126 mg/dL (7.0 mmol/L) or higher.  Your random blood glucose level is 200 mg/dL (11.1 mmol/L) or higher.  Your A1c level is 6.5% or higher.  Your OGTT result is higher than 200 mg/dL (11.1 mmol/L). These blood tests may be repeated to confirm your diagnosis. How is this treated? Your treatment may be managed by a specialist called an endocrinologist. Type 2 diabetes may be treated by following instructions from your health care provider about:  Making diet and lifestyle changes. This may include: ? Following an individualized nutrition plan that is developed by a diet and nutrition specialist (registered dietitian). ? Exercising regularly. ? Finding ways to manage stress.  Checking your blood glucose level as often as told.  Taking diabetes medicines or insulin daily. This helps to keep your blood glucose levels in the healthy range. ? If you use insulin, you may need to adjust the dosage depending on how physically active you are and what foods you eat. Your health care provider will tell you how to adjust your dosage.    Taking medicines to help prevent complications from diabetes, such as: ? Aspirin. ? Medicine to lower cholesterol. ? Medicine to control blood pressure. Your health care provider will set individualized treatment goals for you. Your goals will be based on  your age, other medical conditions you have, and how you respond to diabetes treatment. Generally, the goal of treatment is to maintain the following blood glucose levels:  Before meals (preprandial): 80-130 mg/dL (4.4-7.2 mmol/L).  After meals (postprandial): below 180 mg/dL (10 mmol/L).  A1c level: less than 7%. Follow these instructions at home: Questions to ask your health care provider  Consider asking the following questions: ? Do I need to meet with a diabetes educator? ? Where can I find a support group for people with diabetes? ? What equipment will I need to manage my diabetes at home? ? What diabetes medicines do I need, and when should I take them? ? How often do I need to check my blood glucose? ? What number can I call if I have questions? ? When is my next appointment? General instructions  Take over-the-counter and prescription medicines only as told by your health care provider.  Keep all follow-up visits as told by your health care provider. This is important.  For more information about diabetes, visit: ? American Diabetes Association (ADA): www.diabetes.org ? American Association of Diabetes Educators (AADE): www.diabeteseducator.org Contact a health care provider if:  Your blood glucose is at or above 240 mg/dL (13.3 mmol/L) for 2 days in a row.  You have been sick or have had a fever for 2 days or longer, and you are not getting better.  You have any of the following problems for more than 6 hours: ? You cannot eat or drink. ? You have nausea and vomiting. ? You have diarrhea. Get help right away if:  Your blood glucose is lower than 54 mg/dL (3.0 mmol/L).  You become confused or you have trouble thinking clearly.  You have difficulty breathing.  You have moderate or large ketone levels in your urine. Summary  Type 2 diabetes (type 2 diabetes mellitus) is a long-term (chronic) disease. In type 2 diabetes, the pancreas does not make enough of a  hormone called insulin, or cells in the body do not respond properly to insulin that the body makes (insulin resistance).  This condition is treated by making diet and lifestyle changes and taking diabetes medicines or insulin.  Your health care provider will set individualized treatment goals for you. Your goals will be based on your age, other medical conditions you have, and how you respond to diabetes treatment.  Keep all follow-up visits as told by your health care provider. This is important. This information is not intended to replace advice given to you by your health care provider. Make sure you discuss any questions you have with your health care provider. Document Released: 05/02/2005 Document Revised: 06/30/2017 Document Reviewed: 06/05/2015 Elsevier Patient Education  2020 Elsevier Inc.  

## 2018-11-27 ENCOUNTER — Telehealth: Payer: Self-pay | Admitting: Internal Medicine

## 2018-11-27 DIAGNOSIS — N183 Chronic kidney disease, stage 3 unspecified: Secondary | ICD-10-CM

## 2018-11-27 DIAGNOSIS — E0840 Diabetes mellitus due to underlying condition with diabetic neuropathy, unspecified: Secondary | ICD-10-CM

## 2018-11-27 DIAGNOSIS — M1A3790 Chronic gout due to renal impairment, unspecified ankle and foot, without tophus (tophi): Secondary | ICD-10-CM

## 2018-11-27 MED ORDER — CANAGLIFLOZIN 100 MG PO TABS: 100.0000 mg | ORAL_TABLET | Freq: Every day | ORAL | 1 refills | Status: DC

## 2018-11-27 MED ORDER — ALLOPURINOL 100 MG PO TABS: 100.0000 mg | ORAL_TABLET | Freq: Every day | ORAL | 1 refills | Status: DC

## 2018-11-27 NOTE — Telephone Encounter (Signed)
Medication Refill - Medication: allopurinol (ZYLOPRIM) 100 MG tablet/canagliflozin (INVOKANA) 100 MG TABS tablet / Pt stated that prescriptions were sent to [facility] which he does not use. Requesting they be cancelled at [facility] and sent to his correct [place] as soon as possible.  Has the patient contacted their pharmacy? Yes.   (Agent: If no, request that the patient contact the pharmacy for the refill.) (Agent: If yes, when and what did the pharmacy advise?)  Preferred Pharmacy (with phone number or street name):  [facility] [place], [place] [phone] (Phone) [phone] (Fax)     Agent: Please be advised that RX refills may take up to 3 business days. We ask that you follow-up with your pharmacy.

## 2018-11-27 NOTE — Telephone Encounter (Signed)
erx sent

## 2018-12-19 ENCOUNTER — Other Ambulatory Visit: Payer: Self-pay | Admitting: Internal Medicine

## 2018-12-19 DIAGNOSIS — E785 Hyperlipidemia, unspecified: Secondary | ICD-10-CM

## 2018-12-19 DIAGNOSIS — I10 Essential (primary) hypertension: Secondary | ICD-10-CM

## 2018-12-19 MED ORDER — LOSARTAN POTASSIUM 50 MG PO TABS: 50.0000 mg | ORAL_TABLET | Freq: Every day | ORAL | 1 refills | Status: DC

## 2018-12-19 MED ORDER — CARVEDILOL 6.25 MG PO TABS: ORAL_TABLET | ORAL | 1 refills | Status: DC

## 2018-12-19 MED ORDER — PRAVASTATIN SODIUM 40 MG PO TABS: ORAL_TABLET | ORAL | 1 refills | Status: DC

## 2018-12-31 ENCOUNTER — Telehealth: Payer: Self-pay

## 2018-12-31 NOTE — Telephone Encounter (Signed)
Pt PPM is at [facility] as of 12/29/18. Pt aware.

## 2019-01-03 NOTE — Telephone Encounter (Signed)
Follow up     Scheduled pt for appt with Dr. Lovena Le on 09.25.20. Pt needs a call back, he has questions about setting up for having generator change. He said he needs to go ahead and ask because his children live out of town.

## 2019-01-09 NOTE — Telephone Encounter (Signed)
Call returned to Pt.  Pt would like to be scheduled for February 13, 2019 first case.  Pt put on lab schedule.  Will need labs after appt, and covid scheduled for 9/26.  Complete work up for gen change

## 2019-01-10 NOTE — Progress Notes (Addendum)
Subjective:   Connor Dixon is a 84 y.o. male who presents for Medicare Annual/Subsequent preventive examination.  Review of Systems:   Cardiac Risk Factors include: advanced age (>18mn, >>65women);diabetes mellitus;dyslipidemia;obesity (BMI >30kg/m2);hypertension;male gender Sleep patterns: feels rested on waking, gets up 1-2 times nightly to void and sleeps 7 hours nightly.    Home Safety/Smoke Alarms: Feels safe in home. Smoke alarms in place.  Living environment; residence and Firearm Safety: [place] can live on one level.Lives alone, no needs for DME, good support system Seat Belt Safety/Bike Helmet: Wears seat belt.   PSA-  Lab Results  Component Value Date   PSA 1.77 01/06/2010   PSA 1.80 08/06/2007   PSA 1.22 10/31/2006       Objective:    Vitals: BP 112/64   Pulse 81   Resp 17   Ht 5' 8" (1.727 m)   Wt 280 lb (127 kg)   SpO2 98%   BMI 42.57 kg/m   Body mass index is 42.57 kg/m.  Advanced Directives 01/11/2019 01/09/2018 01/28/2016 04/21/2015  Does Patient Have a Medical Advance Directive? Yes Yes Yes Yes  Type of AParamed[place]of [place] will [place] will [place] will -  Does patient want to make changes to medical advance directive? - - No - Patient declined -  Copy of [place] Chart? No - copy requested No - copy requested Yes -    Tobacco Social History   Tobacco Use  Smoking Status Never Smoker  Smokeless Tobacco Never Used     Counseling given: Not Answered  Past Medical History:  Diagnosis Date  . Acute on chronic diastolic congestive heart failure ([place]   . Arthus phenomenon   . Atrioventricular block, complete ([place]   . Cardiac pacemaker in situ 07/22/2010  . Chronic kidney disease, stage 3 (HCC)    Presumably from longstanding DM & HTN. Scr has gradually increased over the past 2 years (1.6 in 12/2010, 2 in 03/2011, and up to 2.1  in 01/2013)  . DIABETES MELLITUS, TYPE I, ADULT ONSET 08/06/2007  . DM (diabetes mellitus) ([place]    adult onset   . Essential hypertension, benign 08/06/2007   Very good BP control  . History of second degree heart block   . HLD (hyperlipidemia)   . HYPERLIPIDEMIA 08/06/2007  . Hypopotassemia   . Morbid obesity ([place]scalante   . Obesity   . Other and unspecified angina pectoris    typical  . Type I (juvenile type) diabetes mellitus without mention of complication, not stated as uncontrolled    (04/25/13 OV with Dr. CMarval Regal = Poorly controlled & is working  w/Dr. NCrissie Sicklesto improve this. Most recent Hgb A1c was 8.2% but Mr. AAhartreports it was down to 7.6 last month.   Past Surgical History:  Procedure Laterality Date  . CATARACT EXTRACTION  8/08   [place]  . PTVDP  12/11   PPM - [facility] [facility]  . TOTAL HIP ARTHROPLASTY  04/05/06   right   Family History  Problem Relation Age of Onset  . Asthma Father   . Coronary artery disease Father   . Diabetes Father   . Hyperlipidemia Father   . Stroke Father 843 . Coronary artery disease Mother   . Asthma Mother   . Heart attack Mother 833 . Colon cancer Neg Hx        no definate prostate CA  Social History   Socioeconomic History  . Marital status: Widowed    Spouse name: Not on file  . Number of children: 2  . Years of education: Not on file  . Highest education level: Not on file  Occupational History  . Occupation: retired  Scientific laboratory technician  . Financial resource strain: Not hard at all  . Food insecurity    Worry: Never true    Inability: Never true  . Transportation needs    Medical: No    Non-medical: No  Tobacco Use  . Smoking status: Never Smoker  . Smokeless tobacco: Never Used  Substance and Sexual Activity  . Alcohol use: No  . Drug use: No  . Sexual activity: Not Currently  Lifestyle  . Physical activity    Days per week: 3 days    Minutes per session: 30 min  . Stress: Not at all  Relationships  . Social  connections    Talks on phone: More than three times a week    Gets together: More than three times a week    Attends religious service: More than 4 times per year    Active member of club or organization: Yes    Attends meetings of clubs or organizations: More than 4 times per year    Relationship status: Widowed  Other Topics Concern  . Not on file  Social History Narrative   [employer] BA, 2 Master's degrees. [place] - for certification Education specialist.    Retired Tree surgeon.   Active - travels.   Married - 1961; 2 sons ([place]) and 5 grandchildren.        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     Outpatient Encounter Medications as of 01/11/2019  Medication Sig  . allopurinol (ZYLOPRIM) 100 MG tablet Take 1 tablet (100 mg total) by mouth daily.  Marland Kitchen amoxicillin (AMOXIL) 500 MG capsule TAKE ONE CAPSULE BY MOUTH THREE TIMES DAILY. TAKE ONLY WHEN HAVING DENTAL PROCEDURES  . blood glucose meter kit and supplies KIT Dispense what is covered by patients insurance.  . Blood Glucose Monitoring Suppl (ONETOUCH VERIO) w/Device KIT 1 Device by Does not apply route daily. Use as directed  . calcitRIOL (ROCALTROL) 0.25 MCG capsule Take 1 capsule by mouth every other day.  . canagliflozin (INVOKANA) 100 MG TABS tablet Take 1 tablet (100 mg total) by mouth daily before breakfast.  . carvedilol (COREG) 6.25 MG tablet TAKE 1 TABLET(6.25 MG) BY MOUTH TWICE DAILY WITH A MEAL  . ELIQUIS 2.5 MG TABS tablet TAKE 1 TABLET BY MOUTH TWICE DAILY  . glucose blood test strip USE TO CHECK BLOOD SUGAR UP TO FOUR TIMES DAILY  . insulin glargine (LANTUS) 100 UNIT/ML injection INJECT 25 UNITS INTO THE SKIN AT BEDTIME  . insulin NPH-regular Human (HUMULIN 70/30) (70-30) 100 UNIT/ML injection ADMINISTER 65 UNITS UNDER THE SKIN DAILY WITH BREAKFAST  . Insulin Pen Needle (B-D ULTRAFINE III SHORT PEN) 31G X 8 MM MISC USE TO ADMINISTER INSULIN THREE TIMES DAILY  . Insulin Syringe-Needle  U-100 (INSULIN SYRINGE .5CC/31GX5/16") 31G X 5/16" 0.5 ML MISC USE AS DIRECTED FOUR TIMES DAILY  . losartan (COZAAR) 50 MG tablet Take 1 tablet (50 mg total) by mouth daily.  Glory Rosebush DELICA LANCETS 39J MISC TEST THREE TIMES DAILY  . pravastatin (PRAVACHOL) 40 MG tablet TAKE 1 TABLET(40 MG) BY MOUTH DAILY  . furosemide (LASIX) 40 MG tablet Take 1 tablet (40 mg total) by mouth 2 (two) times daily.  No facility-administered encounter medications on file as of 01/11/2019.     Activities of Daily Living In your present state of health, do you have any difficulty performing the following activities: 01/11/2019  Hearing? N  Vision? N  Difficulty concentrating or making decisions? N  Walking or climbing stairs? N  Dressing or bathing? N  Doing errands, shopping? N  Preparing Food and eating ? N  Using the Toilet? N  In the past six months, have you accidently leaked urine? N  Do you have problems with loss of bowel control? N  Managing your Medications? N  Managing your Finances? N  Housekeeping or managing your Housekeeping? N  Some recent data might be hidden    Patient Care Team: Janith Lima, MD as PCP - General (Internal Medicine) Evans Lance, MD (Cardiology) Elsie Saas, MD (Orthopedic Surgery) Shon Hough, MD (Ophthalmology)   Assessment:   This is a routine wellness examination for Ubaldo. Physical assessment deferred to PCP.  Exercise Activities and Dietary recommendations Current Exercise Habits: The patient does not participate in regular exercise at present;Home exercise routine, Time (Minutes): 30, Frequency (Times/Week): 5, Weekly Exercise (Minutes/Week): 150, Intensity: Mild, Exercise limited by: orthopedic condition(s) Diet (meal preparation, eat out, water intake, caffeinated beverages, dairy products, fruits and vegetables): in general, a "healthy" diet     Reviewed heart healthy and diabetic diet. Encouraged patient to increase daily water and  healthy fluid intake.  Goals    . Patient Stated     Increase the amount of time that I work out in my yard and flower garden. Continue to love my family and enjoy life.       Fall Risk Fall Risk  01/11/2019 11/26/2018 01/09/2018 01/28/2016 04/21/2015  Falls in the past year? 1 1 No No No  Number falls in past yr: 1 1 - - -  Injury with Fall? 0 0 - - -  Risk for fall due to : Impaired balance/gait;Impaired mobility Impaired balance/gait;Impaired mobility Impaired balance/gait;Impaired mobility - -  Follow up - Falls evaluation completed;Education provided - - -   Depression Screen PHQ 2/9 Scores 11/26/2018 01/09/2018 09/07/2017 09/07/2017  PHQ - 2 Score 0 1 0 0  PHQ- 9 Score - 1 0 0    Cognitive Function MMSE - Mini Mental State Exam 01/09/2018  Orientation to time 5  Orientation to Place 5  Registration 3  Attention/ Calculation 5  Recall 2  Language- name 2 objects 2  Language- repeat 1  Language- follow 3 step command 3  Language- read & follow direction 1  Write a sentence 1  Copy design 1  Total score 29       Ad8 score reviewed for issues:  Issues making decisions: no  Less interest in hobbies / activities: no  Repeats questions, stories (family complaining): no  Trouble using ordinary gadgets (microwave, computer, phone):no  Forgets the month or year: no  Mismanaging finances: no  Remembering appts: no  Daily problems with thinking and/or memory: no Ad8 score is= 0  Immunization History  Administered Date(s) Administered  . Fluad Quad(high Dose 65+) 01/11/2019  . H1N1 06/05/2008  . Influenza Split 02/22/2011  . Influenza Whole 02/04/2008, 02/24/2009, 02/05/2010  . Influenza, High Dose Seasonal PF 02/20/2015, 01/27/2016, 01/31/2017, 02/07/2018  . Influenza,inj,Quad PF,6+ Mos 01/15/2013  . Pneumococcal Conjugate-13 04/02/2013  . Pneumococcal Polysaccharide-23 01/09/2012  . Td 01/09/2012  . Zoster 02/28/2014  . Zoster Recombinat (Shingrix) 08/16/2017,  10/17/2017   Screening Tests Health Maintenance  Topic Date Due  . FOOT EXAM  03/15/2019  . OPHTHALMOLOGY EXAM  09/24/2019  . TETANUS/TDAP  01/08/2022  . INFLUENZA VACCINE  Completed  . PNA vac Low Risk Adult  Completed       Plan:    Reviewed health maintenance screenings with patient today and relevant education, vaccines, and/or referrals were provided.   I have personally reviewed and noted the following in the patient's chart:   . Medical and social history . Use of alcohol, tobacco or illicit drugs  . Current medications and supplements . Functional ability and status . Nutritional status . Physical activity . Advanced directives . List of other physicians . Vitals . Screenings to include cognitive, depression, and falls . Referrals and appointments  In addition, I have reviewed and discussed with patient certain preventive protocols, quality metrics, and best practice recommendations. A written personalized care plan for preventive services as well as general preventive health recommendations were provided to patient.     Michiel Cowboy, RN  01/11/2019    Medical screening examination/treatment/procedure(s) were performed by non-physician practitioner and as supervising physician I was immediately available for consultation/collaboration. I agree with above. Binnie Rail, MD

## 2019-01-11 ENCOUNTER — Other Ambulatory Visit: Payer: Self-pay

## 2019-01-11 ENCOUNTER — Ambulatory Visit (INDEPENDENT_AMBULATORY_CARE_PROVIDER_SITE_OTHER): Payer: Medicare Other | Admitting: *Deleted

## 2019-01-11 VITALS — BP 112/64 | HR 81 | Resp 17 | Ht 68.0 in | Wt 280.0 lb

## 2019-01-11 DIAGNOSIS — Z23 Encounter for immunization: Secondary | ICD-10-CM

## 2019-01-11 NOTE — Patient Instructions (Signed)
Continue doing brain stimulating activities (puzzles, reading, adult coloring books, staying active) to keep memory sharp.   Continue to eat heart healthy diet (full of fruits, vegetables, whole grains, lean protein, water--limit salt, fat, and sugar intake) and increase physical activity as tolerated.   Connor Dixon , Thank you for taking time to come for your Medicare Wellness Visit. I appreciate your ongoing commitment to your health goals. Please review the following plan we discussed and let me know if I can assist you in the future.   These are the goals we discussed: Goals    . Patient Stated     Increase the amount of time that I work out in my yard and flower garden. Continue to love my family and enjoy life.       This is a list of the screening recommended for you and due dates:  Health Maintenance  Topic Date Due  . Flu Shot  12/15/2018  . Complete foot exam   03/15/2019  . Eye exam for diabetics  09/24/2019  . Tetanus Vaccine  01/08/2022  . Pneumonia vaccines  Completed    Preventive Care 28 Years and Older, Male Preventive care refers to lifestyle choices and visits with your health care provider that can promote health and wellness. This includes:  A yearly physical exam. This is also called an annual well check.  Regular dental and eye exams.  Immunizations.  Screening for certain conditions.  Healthy lifestyle choices, such as diet and exercise. What can I expect for my preventive care visit? Physical exam Your health care provider will check:  Height and weight. These may be used to calculate body mass index (BMI), which is a measurement that tells if you are at a healthy weight.  Heart rate and blood pressure.  Your skin for abnormal spots. Counseling Your health care provider may ask you questions about:  Alcohol, tobacco, and drug use.  Emotional well-being.  Home and relationship well-being.  Sexual activity.  Eating habits.  History of  falls.  Memory and ability to understand (cognition).  Work and work Statistician. What immunizations do I need?  Influenza (flu) vaccine  This is recommended every year. Tetanus, diphtheria, and pertussis (Tdap) vaccine  You may need a Td booster every 10 years. Varicella (chickenpox) vaccine  You may need this vaccine if you have not already been vaccinated. Zoster (shingles) vaccine  You may need this after age 20. Pneumococcal conjugate (PCV13) vaccine  One dose is recommended after age 43. Pneumococcal polysaccharide (PPSV23) vaccine  One dose is recommended after age 53. Measles, mumps, and rubella (MMR) vaccine  You may need at least one dose of MMR if you were born in 1957 or later. You may also need a second dose. Meningococcal conjugate (MenACWY) vaccine  You may need this if you have certain conditions. Hepatitis A vaccine  You may need this if you have certain conditions or if you travel or work in places where you may be exposed to hepatitis A. Hepatitis B vaccine  You may need this if you have certain conditions or if you travel or work in places where you may be exposed to hepatitis B. Haemophilus influenzae type b (Hib) vaccine  You may need this if you have certain conditions. You may receive vaccines as individual doses or as more than one vaccine together in one shot (combination vaccines). Talk with your health care provider about the risks and benefits of combination vaccines. What tests do I need? Blood  tests  Lipid and cholesterol levels. These may be checked every 5 years, or more frequently depending on your overall health.  Hepatitis C test.  Hepatitis B test. Screening  Lung cancer screening. You may have this screening every year starting at age 32 if you have a 30-pack-year history of smoking and currently smoke or have quit within the past 15 years.  Colorectal cancer screening. All adults should have this screening starting at age 49  and continuing until age 3. Your health care provider may recommend screening at age 79 if you are at increased risk. You will have tests every 1-10 years, depending on your results and the type of screening test.  Prostate cancer screening. Recommendations will vary depending on your family history and other risks.  Diabetes screening. This is done by checking your blood sugar (glucose) after you have not eaten for a while (fasting). You may have this done every 1-3 years.  Abdominal aortic aneurysm (AAA) screening. You may need this if you are a current or former smoker.  Sexually transmitted disease (STD) testing. Follow these instructions at home: Eating and drinking  Eat a diet that includes fresh fruits and vegetables, whole grains, lean protein, and low-fat dairy products. Limit your intake of foods with high amounts of sugar, saturated fats, and salt.  Take vitamin and mineral supplements as recommended by your health care provider.  Do not drink alcohol if your health care provider tells you not to drink.  If you drink alcohol: ? Limit how much you have to 0-2 drinks a day. ? Be aware of how much alcohol is in your drink. In the [place] one drink equals one 12 oz bottle of beer (355 mL), one 5 oz glass of Jamire Shabazz (148 mL), or one 1 oz glass of hard liquor (44 mL). Lifestyle  Take daily care of your teeth and gums.  Stay active. Exercise for at least 30 minutes on 5 or more days each week.  Do not use any products that contain nicotine or tobacco, such as cigarettes, e-cigarettes, and chewing tobacco. If you need help quitting, ask your health care provider.  If you are sexually active, practice safe sex. Use a condom or other form of protection to prevent STIs (sexually transmitted infections).  Talk with your health care provider about taking a low-dose aspirin or statin. What's next?  Visit your health care provider once a year for a well check visit.  Ask your health care  provider how often you should have your eyes and teeth checked.  Stay up to date on all vaccines. This information is not intended to replace advice given to you by your health care provider. Make sure you discuss any questions you have with your health care provider. Document Released: 05/29/2015 Document Revised: 04/26/2018 Document Reviewed: 04/26/2018 Elsevier Patient Education  2020 [employer].

## 2019-01-18 ENCOUNTER — Other Ambulatory Visit: Payer: Self-pay | Admitting: Internal Medicine

## 2019-01-18 NOTE — Telephone Encounter (Signed)
Prescription refill request for Eliquis received.  Last office visit: Connor Dixon (02-02-2018) Scr: 1.7 (10-09-2018) Age: 84 y.o. Weight: 129.3 kg (11-26-2018)  Prescription refill sent.

## 2019-01-28 ENCOUNTER — Ambulatory Visit (INDEPENDENT_AMBULATORY_CARE_PROVIDER_SITE_OTHER): Payer: Medicare Other | Admitting: *Deleted

## 2019-01-28 DIAGNOSIS — I442 Atrioventricular block, complete: Secondary | ICD-10-CM

## 2019-01-29 LAB — CUP PACEART REMOTE DEVICE CHECK
Battery Remaining Longevity: 0 mo
Battery Voltage: 2.57 V
Brady Statistic AP VP Percent: 1.2 %
Brady Statistic AP VS Percent: 1 %
Brady Statistic AS VP Percent: 98 %
Brady Statistic AS VS Percent: 1 %
Brady Statistic RA Percent Paced: 1 %
Brady Statistic RV Percent Paced: 99 %
Date Time Interrogation Session: 20200914124737
Implantable Lead Implant Date: 20111202
Implantable Lead Implant Date: 20111202
Implantable Lead Location: 753859
Implantable Lead Location: 753860
Implantable Pulse Generator Implant Date: 20111202
Lead Channel Impedance Value: 410 Ohm
Lead Channel Impedance Value: 440 Ohm
Lead Channel Pacing Threshold Amplitude: 0.75 V
Lead Channel Pacing Threshold Amplitude: 1 V
Lead Channel Pacing Threshold Pulse Width: 0.4 ms
Lead Channel Pacing Threshold Pulse Width: 0.8 ms
Lead Channel Sensing Intrinsic Amplitude: 11.5 mV
Lead Channel Sensing Intrinsic Amplitude: 2.1 mV
Lead Channel Setting Pacing Amplitude: 2 V
Lead Channel Setting Pacing Amplitude: 2.5 V
Lead Channel Setting Pacing Pulse Width: 0.8 ms
Lead Channel Setting Sensing Sensitivity: 4 mV
Pulse Gen Model: 2210
Pulse Gen Serial Number: 7192434

## 2019-02-08 ENCOUNTER — Encounter: Payer: Self-pay | Admitting: Internal Medicine

## 2019-02-08 ENCOUNTER — Other Ambulatory Visit: Payer: Self-pay

## 2019-02-08 ENCOUNTER — Ambulatory Visit (INDEPENDENT_AMBULATORY_CARE_PROVIDER_SITE_OTHER): Payer: Medicare Other | Admitting: Internal Medicine

## 2019-02-08 VITALS — BP 118/74 | HR 83 | Ht 68.0 in | Wt 285.4 lb

## 2019-02-08 DIAGNOSIS — Z950 Presence of cardiac pacemaker: Secondary | ICD-10-CM

## 2019-02-08 DIAGNOSIS — I442 Atrioventricular block, complete: Secondary | ICD-10-CM

## 2019-02-08 LAB — CBC WITH DIFFERENTIAL/PLATELET
Basophils Absolute: 0 10*3/uL (ref 0.0–0.2)
Basos: 1 %
EOS (ABSOLUTE): 0.2 10*3/uL (ref 0.0–0.4)
Eos: 3 %
Hematocrit: 47.7 % (ref 37.5–51.0)
Hemoglobin: 15.9 g/dL (ref 13.0–17.7)
Immature Grans (Abs): 0 10*3/uL (ref 0.0–0.1)
Immature Granulocytes: 0 %
Lymphocytes Absolute: 2 10*3/uL (ref 0.7–3.1)
Lymphs: 25 %
MCH: 30.5 pg (ref 26.6–33.0)
MCHC: 33.3 g/dL (ref 31.5–35.7)
MCV: 91 fL (ref 79–97)
Monocytes Absolute: 0.9 10*3/uL (ref 0.1–0.9)
Monocytes: 11 %
Neutrophils Absolute: 4.9 10*3/uL (ref 1.4–7.0)
Neutrophils: 60 %
Platelets: 191 10*3/uL (ref 150–450)
RBC: 5.22 x10E6/uL (ref 4.14–5.80)
RDW: 12.4 % (ref 11.6–15.4)
WBC: 8.1 10*3/uL (ref 3.4–10.8)

## 2019-02-08 LAB — BASIC METABOLIC PANEL
BUN/Creatinine Ratio: 21 (ref 10–24)
BUN: 47 mg/dL — ABNORMAL HIGH (ref 8–27)
CO2: 24 mmol/L (ref 20–29)
Calcium: 9.6 mg/dL (ref 8.6–10.2)
Chloride: 99 mmol/L (ref 96–106)
Creatinine, Ser: 2.19 mg/dL — ABNORMAL HIGH (ref 0.76–1.27)
GFR calc Af Amer: 31 mL/min/{1.73_m2} — ABNORMAL LOW (ref >59)
GFR calc non Af Amer: 27 mL/min/{1.73_m2} — ABNORMAL LOW (ref >59)
Glucose: 110 mg/dL — ABNORMAL HIGH (ref 65–99)
Potassium: 4 mmol/L (ref 3.5–5.2)
Sodium: 139 mmol/L (ref 134–144)

## 2019-02-08 NOTE — Patient Instructions (Signed)
Medication Instructions:  Your physician recommends that you continue on your current medications as directed. Please refer to the Current Medication list given to you today.  Labwork: You will get lab work today:  BMP and CBC  Testing/Procedures: None ordered.  Follow-Up: SEE LETTER for instructions   Any Other Special Instructions Will Be Listed Below (If Applicable).  If you need a refill on your cardiac medications before your next appointment, please call your pharmacy.

## 2019-02-08 NOTE — Progress Notes (Signed)
HPI Connor Dixon returns today for followup. He has reached ERI on his PPM. He has PAF. He has had no chest pain or sob. No edema. No bleeding on eliquis.  Allergies  Allergen Reactions  . Oysters [Shellfish Allergy]     Blisters and swelling  . Apple Other (See Comments)    Sore throat     Current Outpatient Medications  Medication Sig Dispense Refill  . allopurinol (ZYLOPRIM) 100 MG tablet Take 1 tablet (100 mg total) by mouth daily. 90 tablet 1  . amoxicillin (AMOXIL) 500 MG capsule TAKE ONE CAPSULE BY MOUTH THREE TIMES DAILY. TAKE ONLY WHEN HAVING DENTAL PROCEDURES 30 capsule 2  . blood glucose meter kit and supplies KIT Dispense what is covered by patients insurance. 1 each 0  . Blood Glucose Monitoring Suppl (ONETOUCH VERIO) w/Device KIT 1 Device by Does not apply route daily. Use as directed 1 kit 0  . calcitRIOL (ROCALTROL) 0.25 MCG capsule Take 1 capsule by mouth every other day.  5  . canagliflozin (INVOKANA) 100 MG TABS tablet Take 1 tablet (100 mg total) by mouth daily before breakfast. 90 tablet 1  . carvedilol (COREG) 6.25 MG tablet TAKE 1 TABLET(6.25 MG) BY MOUTH TWICE DAILY WITH A MEAL 180 tablet 1  . ELIQUIS 2.5 MG TABS tablet TAKE 1 TABLET BY MOUTH TWICE DAILY 60 tablet 5  . glucose blood test strip USE TO CHECK BLOOD SUGAR UP TO FOUR TIMES DAILY 500 each 3  . insulin glargine (LANTUS) 100 UNIT/ML injection INJECT 25 UNITS INTO THE SKIN AT BEDTIME 30 mL 3  . insulin NPH-regular Human (HUMULIN 70/30) (70-30) 100 UNIT/ML injection ADMINISTER 65 UNITS UNDER THE SKIN DAILY WITH BREAKFAST 20 mL 3  . Insulin Pen Needle (B-D ULTRAFINE III SHORT PEN) 31G X 8 MM MISC USE TO ADMINISTER INSULIN THREE TIMES DAILY 100 each 11  . Insulin Syringe-Needle U-100 (INSULIN SYRINGE .5CC/31GX5/16") 31G X 5/16" 0.5 ML MISC USE AS DIRECTED FOUR TIMES DAILY 200 each 2  . losartan (COZAAR) 50 MG tablet Take 1 tablet (50 mg total) by mouth daily. 90 tablet 1  . ONETOUCH DELICA LANCETS 14G  MISC TEST THREE TIMES DAILY 200 each 11  . pravastatin (PRAVACHOL) 40 MG tablet TAKE 1 TABLET(40 MG) BY MOUTH DAILY 90 tablet 1  . furosemide (LASIX) 40 MG tablet Take 1 tablet (40 mg total) by mouth 2 (two) times daily. 180 tablet 3   No current facility-administered medications for this visit.      Past Medical History:  Diagnosis Date  . Acute on chronic diastolic congestive heart failure ([place])   . Arthus phenomenon   . Atrioventricular block, complete ([place])   . Cardiac pacemaker in situ 07/22/2010  . Chronic kidney disease, stage 3 ([facility])    Presumably from longstanding DM & HTN. Scr has gradually increased over the past 2 years (1.6 in 12/2010, 2 in 03/2011, and up to 2.1 in 01/2013)  . DIABETES MELLITUS, TYPE I, ADULT ONSET 08/06/2007  . DM (diabetes mellitus) ([place])    adult onset   . Essential hypertension, benign 08/06/2007   Very good BP control  . History of second degree heart block   . HLD (hyperlipidemia)   . HYPERLIPIDEMIA 08/06/2007  . Hypopotassemia   . Morbid obesity ([place])   . Obesity   . Other and unspecified angina pectoris    typical  . Type I (juvenile type) diabetes mellitus without mention of complication, not stated as uncontrolled    (  04/25/13 OV with Dr. Marval Regal) = Poorly controlled & is working  w/Dr. Crissie Sickles to improve this. Most recent Hgb A1c was 8.2% but Mr. Posthumus reports it was down to 7.6 last month.    ROS:   All systems reviewed and negative except as noted in the HPI.   Past Surgical History:  Procedure Laterality Date  . CATARACT EXTRACTION  8/08   [facility]  . PTVDP  12/11   PPM - St. Jude  . TOTAL HIP ARTHROPLASTY  04/05/06   right     Family History  Problem Relation Age of Onset  . Asthma Father   . Coronary artery disease Father   . Diabetes Father   . Hyperlipidemia Father   . Stroke Father 33  . Coronary artery disease Mother   . Asthma Mother   . Heart attack Mother 2  . Colon cancer Neg Hx        no definate  prostate CA      Social History   Socioeconomic History  . Marital status: Widowed    Spouse name: Not on file  . Number of children: 2  . Years of education: Not on file  . Highest education level: Not on file  Occupational History  . Occupation: retired  Scientific laboratory technician  . Financial resource strain: Not hard at all  . Food insecurity    Worry: Never true    Inability: Never true  . Transportation needs    Medical: No    Non-medical: No  Tobacco Use  . Smoking status: Never Smoker  . Smokeless tobacco: Never Used  Substance and Sexual Activity  . Alcohol use: No  . Drug use: No  . Sexual activity: Not Currently  Lifestyle  . Physical activity    Days per week: 3 days    Minutes per session: 30 min  . Stress: Not at all  Relationships  . Social connections    Talks on phone: More than three times a week    Gets together: More than three times a week    Attends religious service: More than 4 times per year    Active member of club or organization: Yes    Attends meetings of clubs or organizations: More than 4 times per year    Relationship status: Widowed  . Intimate partner violence    Fear of current or ex partner: No    Emotionally abused: No    Physically abused: No    Forced sexual activity: No  Other Topics Concern  . Not on file  Social History Narrative   [employer] BA, 2 Master's degrees. [place] - for certification Education specialist.    Retired Tree surgeon.   Active - travels.   Married - 1961; 2 sons ([place]) and 5 grandchildren.        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      BP 118/74   Pulse 83   Ht '5\' 8"'$  (1.727 m)   Wt 285 lb 6.4 oz (129.5 kg)   SpO2 94%   BMI 43.39 kg/m   Physical Exam:  Well appearing 84 yo man, NAD HEENT: Unremarkable Neck:  7 cm JVD, no thyromegally Lymphatics:  No adenopathy Back:  No CVA tenderness Lungs:  Clear with no wheezes HEART:  Regular rate rhythm, no murmurs, no rubs,  no clicks Abd:  soft, positive bowel sounds, no organomegally, no rebound, no guarding Ext:  2 plus pulses, no edema, no cyanosis, no  clubbing Skin:  No rashes no nodules Neuro:  CN II through XII intact, motor grossly intact  EKG - nsr with ventricular pacing  DEVICE  Normal device function.  See PaceArt for details. ERI.  Assess/Plan: 1. CHB - he is asymptomatic s/p PPM 2. PPM - his St. Jude DDD PM has reached ERI. He has had some reproducible noise on his atrial lead and his sensitivity has been decreased from 0.5 to 1. His sensing of noise appears resolved. He will undergo PPM gen change next week. 3. coags - his eliquis will be held for 2 days prior to his procedure. 4. HTN - his bp is well controlled. No change in meds.  5. Obesity - he is encouraged to lose weight.   Mikle Bosworth.D.

## 2019-02-08 NOTE — H&P (View-Only) (Signed)
HPI [name] returns today for followup. He has reached ERI on his PPM. He has PAF. He has had no chest pain or sob. No edema. No bleeding on eliquis.  Allergies  Allergen Reactions  . Oysters [Shellfish Allergy]     Blisters and swelling  . Apple Other (See Comments)    Sore throat     Current Outpatient Medications  Medication Sig Dispense Refill  . allopurinol (ZYLOPRIM) 100 MG tablet Take 1 tablet (100 mg total) by mouth daily. 90 tablet 1  . amoxicillin (AMOXIL) 500 MG capsule TAKE ONE CAPSULE BY MOUTH THREE TIMES DAILY. TAKE ONLY WHEN HAVING DENTAL PROCEDURES 30 capsule 2  . blood glucose meter kit and supplies KIT Dispense what is covered by patients insurance. 1 each 0  . Blood Glucose Monitoring Suppl (ONETOUCH VERIO) w/Device KIT 1 Device by Does not apply route daily. Use as directed 1 kit 0  . calcitRIOL (ROCALTROL) 0.25 MCG capsule Take 1 capsule by mouth every other day.  5  . canagliflozin (INVOKANA) 100 MG TABS tablet Take 1 tablet (100 mg total) by mouth daily before breakfast. 90 tablet 1  . carvedilol (COREG) 6.25 MG tablet TAKE 1 TABLET(6.25 MG) BY MOUTH TWICE DAILY WITH A MEAL 180 tablet 1  . ELIQUIS 2.5 MG TABS tablet TAKE 1 TABLET BY MOUTH TWICE DAILY 60 tablet 5  . glucose blood test strip USE TO CHECK BLOOD SUGAR UP TO FOUR TIMES DAILY 500 each 3  . insulin glargine (LANTUS) 100 UNIT/ML injection INJECT 25 UNITS INTO THE SKIN AT BEDTIME 30 mL 3  . insulin NPH-regular Human (HUMULIN 70/30) (70-30) 100 UNIT/ML injection ADMINISTER 65 UNITS UNDER THE SKIN DAILY WITH BREAKFAST 20 mL 3  . Insulin Pen Needle (B-D ULTRAFINE III SHORT PEN) 31G X 8 MM MISC USE TO ADMINISTER INSULIN THREE TIMES DAILY 100 each 11  . Insulin Syringe-Needle U-100 (INSULIN SYRINGE .5CC/31GX5/16") 31G X 5/16" 0.5 ML MISC USE AS DIRECTED FOUR TIMES DAILY 200 each 2  . losartan (COZAAR) 50 MG tablet Take 1 tablet (50 mg total) by mouth daily. 90 tablet 1  . ONETOUCH DELICA LANCETS 14G  MISC TEST THREE TIMES DAILY 200 each 11  . pravastatin (PRAVACHOL) 40 MG tablet TAKE 1 TABLET(40 MG) BY MOUTH DAILY 90 tablet 1  . furosemide (LASIX) 40 MG tablet Take 1 tablet (40 mg total) by mouth 2 (two) times daily. 180 tablet 3   No current facility-administered medications for this visit.      Past Medical History:  Diagnosis Date  . Acute on chronic diastolic congestive heart failure ([place])   . Arthus phenomenon   . Atrioventricular block, complete ([place])   . Cardiac pacemaker in situ 07/22/2010  . Chronic kidney disease, stage 3 ([facility])    Presumably from longstanding DM & HTN. Scr has gradually increased over the past 2 years (1.6 in 12/2010, 2 in 03/2011, and up to 2.1 in 01/2013)  . DIABETES MELLITUS, TYPE I, ADULT ONSET 08/06/2007  . DM (diabetes mellitus) ([place])    adult onset   . Essential hypertension, benign 08/06/2007   Very good BP control  . History of second degree heart block   . HLD (hyperlipidemia)   . HYPERLIPIDEMIA 08/06/2007  . Hypopotassemia   . Morbid obesity ([place])   . Obesity   . Other and unspecified angina pectoris    typical  . Type I (juvenile type) diabetes mellitus without mention of complication, not stated as uncontrolled    (  04/25/13 OV with Dr. Marval Regal) = Poorly controlled & is working  w/Dr. Crissie Sickles to improve this. Most recent Hgb A1c was 8.2% but [name] reports it was down to 7.6 last month.    ROS:   All systems reviewed and negative except as noted in the HPI.   Past Surgical History:  Procedure Laterality Date  . CATARACT EXTRACTION  8/08   [facility]  . PTVDP  12/11   PPM - [facility] Jude  . TOTAL HIP ARTHROPLASTY  04/05/06   right     Family History  Problem Relation Age of Onset  . Asthma Father   . Coronary artery disease Father   . Diabetes Father   . Hyperlipidemia Father   . Stroke Father 103  . Coronary artery disease Mother   . Asthma Mother   . Heart attack Mother 4  . Colon cancer Neg Hx        no definate  prostate CA      Social History   Socioeconomic History  . Marital status: Widowed    Spouse name: Not on file  . Number of children: 2  . Years of education: Not on file  . Highest education level: Not on file  Occupational History  . Occupation: retired  Scientific laboratory technician  . Financial resource strain: Not hard at all  . Food insecurity    Worry: Never true    Inability: Never true  . Transportation needs    Medical: No    Non-medical: No  Tobacco Use  . Smoking status: Never Smoker  . Smokeless tobacco: Never Used  Substance and Sexual Activity  . Alcohol use: No  . Drug use: No  . Sexual activity: Not Currently  Lifestyle  . Physical activity    Days per week: 3 days    Minutes per session: 30 min  . Stress: Not at all  Relationships  . Social connections    Talks on phone: More than three times a week    Gets together: More than three times a week    Attends religious service: More than 4 times per year    Active member of club or organization: Yes    Attends meetings of clubs or organizations: More than 4 times per year    Relationship status: Widowed  . Intimate partner violence    Fear of current or ex partner: No    Emotionally abused: No    Physically abused: No    Forced sexual activity: No  Other Topics Concern  . Not on file  Social History Narrative   [employer] BA, 2 Master's degrees. [place] - for certification Education specialist.    Retired Tree surgeon.   Active - travels.   Married - 1961; 2 sons ([place]) and 5 grandchildren.        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      BP 118/74   Pulse 83   Ht '5\' 8"'$  (1.727 m)   Wt 285 lb 6.4 oz (129.5 kg)   SpO2 94%   BMI 43.39 kg/m   Physical Exam:  Well appearing 84 yo man, NAD HEENT: Unremarkable Neck:  7 cm JVD, no thyromegally Lymphatics:  No adenopathy Back:  No CVA tenderness Lungs:  Clear with no wheezes HEART:  Regular rate rhythm, no murmurs, no rubs,  no clicks Abd:  soft, positive bowel sounds, no organomegally, no rebound, no guarding Ext:  2 plus pulses, no edema, no cyanosis, no  clubbing Skin:  No rashes no nodules Neuro:  CN II through XII intact, motor grossly intact  EKG - nsr with ventricular pacing  DEVICE  Normal device function.  See PaceArt for details. ERI.  Assess/Plan: 1. CHB - he is asymptomatic s/p PPM 2. PPM - his St. Jude DDD PM has reached ERI. He has had some reproducible noise on his atrial lead and his sensitivity has been decreased from 0.5 to 1. His sensing of noise appears resolved. He will undergo PPM gen change next week. 3. coags - his eliquis will be held for 2 days prior to his procedure. 4. HTN - his bp is well controlled. No change in meds.  5. Obesity - he is encouraged to lose weight.   Mikle Bosworth.D.

## 2019-02-08 NOTE — Progress Notes (Signed)
Remote pacemaker transmission.   

## 2019-02-09 ENCOUNTER — Other Ambulatory Visit (HOSPITAL_COMMUNITY)
Admission: RE | Admit: 2019-02-09 | Discharge: 2019-02-09 | Disposition: A | Discharge status: Home / Self Care | Payer: Medicare Other | Source: Ambulatory Visit | Attending: Internal Medicine | Admitting: Internal Medicine

## 2019-02-09 ENCOUNTER — Encounter: Payer: Self-pay | Admitting: Internal Medicine

## 2019-02-09 DIAGNOSIS — Z20828 Contact with and (suspected) exposure to other viral communicable diseases: Secondary | ICD-10-CM | POA: Diagnosis not present

## 2019-02-09 DIAGNOSIS — Z01812 Encounter for preprocedural laboratory examination: Secondary | ICD-10-CM | POA: Insufficient documentation

## 2019-02-10 LAB — NOVEL CORONAVIRUS, NAA (HOSP ORDER, SEND-OUT TO REF LAB; TAT 18-24 HRS): SARS-CoV-2, NAA: NOT DETECTED

## 2019-02-11 ENCOUNTER — Encounter: Payer: Self-pay | Admitting: Internal Medicine

## 2019-02-12 MED ORDER — DEXTROSE 5 % IV SOLN
3.0000 g | INTRAVENOUS | Status: AC
  Administered 2019-02-13: 3 g via INTRAVENOUS
  Filled 2019-02-12 (×4): qty 3000

## 2019-02-13 ENCOUNTER — Other Ambulatory Visit: Payer: Self-pay | Admitting: Internal Medicine

## 2019-02-13 ENCOUNTER — Other Ambulatory Visit: Payer: Self-pay

## 2019-02-13 ENCOUNTER — Ambulatory Visit (HOSPITAL_COMMUNITY)
Admission: RE | Admit: 2019-02-13 | Discharge: 2019-02-13 | Disposition: A | Discharge status: Home / Self Care | Payer: Medicare Other | Attending: Internal Medicine | Admitting: Internal Medicine

## 2019-02-13 ENCOUNTER — Ambulatory Visit (HOSPITAL_COMMUNITY)
Admission: RE | Disposition: A | Discharge status: Home / Self Care | Payer: Medicare Other | Source: Home / Self Care | Attending: Internal Medicine

## 2019-02-13 ENCOUNTER — Encounter (HOSPITAL_COMMUNITY): Payer: Self-pay | Admitting: Internal Medicine

## 2019-02-13 DIAGNOSIS — I480 Paroxysmal atrial fibrillation: Secondary | ICD-10-CM | POA: Diagnosis not present

## 2019-02-13 DIAGNOSIS — Z79899 Other long term (current) drug therapy: Secondary | ICD-10-CM | POA: Diagnosis not present

## 2019-02-13 DIAGNOSIS — Z833 Family history of diabetes mellitus: Secondary | ICD-10-CM | POA: Insufficient documentation

## 2019-02-13 DIAGNOSIS — Z823 Family history of stroke: Secondary | ICD-10-CM | POA: Insufficient documentation

## 2019-02-13 DIAGNOSIS — Z96641 Presence of right artificial hip joint: Secondary | ICD-10-CM | POA: Diagnosis not present

## 2019-02-13 DIAGNOSIS — Z7901 Long term (current) use of anticoagulants: Secondary | ICD-10-CM | POA: Diagnosis not present

## 2019-02-13 DIAGNOSIS — I5032 Chronic diastolic (congestive) heart failure: Secondary | ICD-10-CM | POA: Insufficient documentation

## 2019-02-13 DIAGNOSIS — Z6841 Body Mass Index (BMI) 40.0 and over, adult: Secondary | ICD-10-CM | POA: Insufficient documentation

## 2019-02-13 DIAGNOSIS — E785 Hyperlipidemia, unspecified: Secondary | ICD-10-CM | POA: Diagnosis not present

## 2019-02-13 DIAGNOSIS — Z950 Presence of cardiac pacemaker: Secondary | ICD-10-CM | POA: Diagnosis present

## 2019-02-13 DIAGNOSIS — N183 Chronic kidney disease, stage 3 (moderate): Secondary | ICD-10-CM | POA: Insufficient documentation

## 2019-02-13 DIAGNOSIS — Z45010 Encounter for checking and testing of cardiac pacemaker pulse generator [battery]: Secondary | ICD-10-CM | POA: Insufficient documentation

## 2019-02-13 DIAGNOSIS — I130 Hypertensive heart and chronic kidney disease with heart failure and stage 1 through stage 4 chronic kidney disease, or unspecified chronic kidney disease: Secondary | ICD-10-CM | POA: Insufficient documentation

## 2019-02-13 DIAGNOSIS — E1022 Type 1 diabetes mellitus with diabetic chronic kidney disease: Secondary | ICD-10-CM | POA: Diagnosis not present

## 2019-02-13 DIAGNOSIS — E0840 Diabetes mellitus due to underlying condition with diabetic neuropathy, unspecified: Secondary | ICD-10-CM

## 2019-02-13 DIAGNOSIS — Z8249 Family history of ischemic heart disease and other diseases of the circulatory system: Secondary | ICD-10-CM | POA: Diagnosis not present

## 2019-02-13 DIAGNOSIS — I442 Atrioventricular block, complete: Secondary | ICD-10-CM

## 2019-02-13 DIAGNOSIS — I495 Sick sinus syndrome: Secondary | ICD-10-CM

## 2019-02-13 DIAGNOSIS — Z794 Long term (current) use of insulin: Secondary | ICD-10-CM | POA: Diagnosis not present

## 2019-02-13 HISTORY — PX: PPM GENERATOR CHANGEOUT: EP1233

## 2019-02-13 LAB — GLUCOSE, CAPILLARY: Glucose-Capillary: 151 mg/dL — ABNORMAL HIGH (ref 70–99)

## 2019-02-13 LAB — SURGICAL PCR SCREEN
MRSA, PCR: NEGATIVE
Staphylococcus aureus: NEGATIVE

## 2019-02-13 SURGERY — PPM GENERATOR CHANGEOUT

## 2019-02-13 MED ORDER — SODIUM CHLORIDE 0.9 % IV SOLN
INTRAVENOUS | Status: DC
  Administered 2019-02-13: 07:00:00 via INTRAVENOUS

## 2019-02-13 MED ORDER — BD PEN NEEDLE SHORT U/F 31G X 8 MM MISC: 2 refills | Status: DC

## 2019-02-13 MED ORDER — LIDOCAINE HCL 1 % IJ SOLN
INTRAMUSCULAR | Status: AC
  Filled 2019-02-13: qty 20

## 2019-02-13 MED ORDER — FENTANYL CITRATE (PF) 100 MCG/2ML IJ SOLN
INTRAMUSCULAR | Status: AC
  Filled 2019-02-13: qty 2

## 2019-02-13 MED ORDER — SODIUM CHLORIDE 0.9 % IV SOLN
80.0000 mg | INTRAVENOUS | Status: AC
  Administered 2019-02-13: 80 mg

## 2019-02-13 MED ORDER — MUPIROCIN 2 % EX OINT
1.0000 "application " | TOPICAL_OINTMENT | Freq: Once | CUTANEOUS | Status: AC
  Administered 2019-02-13: 1 via TOPICAL

## 2019-02-13 MED ORDER — MIDAZOLAM HCL 5 MG/5ML IJ SOLN
INTRAMUSCULAR | Status: AC
  Filled 2019-02-13: qty 5

## 2019-02-13 MED ORDER — ONDANSETRON HCL 4 MG/2ML IJ SOLN: 4.0000 mg | Freq: Four times a day (QID) | INTRAMUSCULAR | Status: DC | PRN

## 2019-02-13 MED ORDER — SODIUM CHLORIDE 0.9 % IV SOLN
INTRAVENOUS | Status: AC
  Filled 2019-02-13: qty 2

## 2019-02-13 MED ORDER — FENTANYL CITRATE (PF) 100 MCG/2ML IJ SOLN
INTRAMUSCULAR | Status: DC | PRN
  Administered 2019-02-13 (×2): 12.5 ug via INTRAVENOUS

## 2019-02-13 MED ORDER — ACETAMINOPHEN 325 MG PO TABS
325.0000 mg | ORAL_TABLET | ORAL | Status: DC | PRN
  Filled 2019-02-13: qty 2

## 2019-02-13 MED ORDER — MIDAZOLAM HCL 5 MG/5ML IJ SOLN
INTRAMUSCULAR | Status: DC | PRN
  Administered 2019-02-13 (×2): 1 mg via INTRAVENOUS

## 2019-02-13 MED ORDER — LIDOCAINE HCL (PF) 1 % IJ SOLN
INTRAMUSCULAR | Status: DC | PRN
  Administered 2019-02-13: 09:00:00 45 mL

## 2019-02-13 MED ORDER — CHLORHEXIDINE GLUCONATE 4 % EX LIQD: 60.0000 mL | Freq: Once | CUTANEOUS | Status: DC

## 2019-02-13 MED ORDER — MUPIROCIN 2 % EX OINT
TOPICAL_OINTMENT | CUTANEOUS | Status: AC
  Filled 2019-02-13: qty 22

## 2019-02-13 SURGICAL SUPPLY — 4 items
CABLE SURGICAL S-101-97-12 (CABLE) ×3 IMPLANT
PACEMAKER ASSURITY DR-RF (Pacemaker) ×2 IMPLANT
PAD PRO RADIOLUCENT 2001M-C (PAD) ×3 IMPLANT
TRAY PACEMAKER INSERTION (PACKS) ×3 IMPLANT

## 2019-02-13 NOTE — Interval H&P Note (Signed)
History and Physical Interval Note:  02/13/2019 7:41 AM  Connor Dixon  has presented today for surgery, with the diagnosis of ERI.  The various methods of treatment have been discussed with the patient and family. After consideration of risks, benefits and other options for treatment, the patient has consented to  Procedure(s): PPM GENERATOR CHANGEOUT (N/A) as a surgical intervention.  The patient's history has been reviewed, patient examined, no change in status, stable for surgery.  I have reviewed the patient's chart and labs.  Questions were answered to the patient's satisfaction.     Connor Dixon

## 2019-02-13 NOTE — Discharge Instructions (Signed)
Post procedure wound care and activity instructions Keep incision clean and dry for 10 days. No driving for 2 days.  You can remove outer dressing tomorrow. Leave steri-strips (little pieces of tape) on until seen in the office for wound check appointment. Call the office [phone]) for redness, drainage, swelling, or fever.    Implantable Cardiac Device Battery Change, Care After This sheet gives you information about how to care for yourself after your procedure. Your health care provider may also give you more specific instructions. If you have problems or questions, contact your health care provider. What can I expect after the procedure? After your procedure, it is common to have:  Pain or soreness at the site where the cardiac device was inserted.  Swelling at the site where the cardiac device was inserted.  You should received an information card for your new device in 4-8 weeks. Follow these instructions at home: Incision care   Keep the incision clean and dry. ? Do not take baths, swim, or use a hot tub until after your wound check.  ? You may shower the day after your procedure, or as directed by your health care provider. ? Pat the area dry with a clean towel. Do not rub the area. This may cause bleeding.  Follow instructions from your health care provider about how to take care of your incision. Make sure you: ? Leave stitches (sutures), skin glue, or adhesive strips in place. These skin closures may need to stay in place for 2 weeks or longer. If adhesive strip edges start to loosen and curl up, you may trim the loose edges. Do not remove adhesive strips completely unless your health care provider tells you to do that.  Check your incision area every day for signs of infection. Check for: ? More redness, swelling, or pain. ? More fluid or blood. ? Warmth. ? Pus or a bad smell. Activity  Do not lift anything that is heavier than 10 lb (4.5 kg) until your health care  provider says it is okay to do so.  For the first week, or as long as told by your health care provider: ? Avoid lifting your affected arm higher than your shoulder. ? After 1 week, Be gentle when you move your arms over your head. It is okay to raise your arm to comb your hair. ? Avoid strenuous exercise.  Ask your health care provider when it is okay to: ? Resume your normal activities. ? Return to work or school. ? Resume sexual activity. Eating and drinking  Eat a heart-healthy diet. This should include plenty of fresh fruits and vegetables, whole grains, low-fat dairy products, and lean protein like chicken and fish.  Limit alcohol intake to no more than 1 drink a day for non-pregnant women and 2 drinks a day for men. One drink equals 12 oz of beer, 5 oz of wine, or 1 oz of hard liquor.  Check ingredients and nutrition facts on packaged foods and beverages. Avoid the following types of food: ? Food that is high in salt (sodium). ? Food that is high in saturated fat, like full-fat dairy or red meat. ? Food that is high in trans fat, like fried food. ? Food and drinks that are high in sugar. Lifestyle  Do not use any products that contain nicotine or tobacco, such as cigarettes and e-cigarettes. If you need help quitting, ask your health care provider.  Take steps to manage and control your weight.  Once cleared,  get regular exercise. Aim for 150 minutes of moderate-intensity exercise (such as walking or yoga) or 75 minutes of vigorous exercise (such as running or swimming) each week.  Manage other health problems, such as diabetes or high blood pressure. Ask your health care provider how you can manage these conditions. General instructions  Do not drive for 24 hours after your procedure if you were given a medicine to help you relax (sedative).  Take over-the-counter and prescription medicines only as told by your health care provider.  Avoid putting pressure on the area  where the cardiac device was placed.  If you need an MRI after your cardiac device has been placed, be sure to tell the health care provider who orders the MRI that you have a cardiac device.  Avoid close and prolonged exposure to electrical devices that have strong magnetic fields. These include: ? Cell phones. Avoid keeping them in a pocket near the cardiac device, and try using the ear opposite the cardiac device. ? MP3 players. ? Household appliances, like microwaves. ? Metal detectors. ? Electric generators. ? High-tension wires.  Keep all follow-up visits as directed by your health care provider. This is important. Contact a health care provider if:  You have pain at the incision site that is not relieved by over-the-counter or prescription medicines.  You have any of these around your incision site or coming from it: ? More redness, swelling, or pain. ? Fluid or blood. ? Warmth to the touch. ? Pus or a bad smell.  You have a fever.  You feel brief, occasional palpitations, light-headedness, or any symptoms that you think might be related to your heart. Get help right away if:  You experience chest pain that is different from the pain at the cardiac device site.  You develop a red streak that extends above or below the incision site.  You experience shortness of breath.  You have palpitations or an irregular heartbeat.  You have light-headedness that does not go away quickly.  You faint or have dizzy spells.  Your pulse suddenly drops or increases rapidly and does not return to normal.  You begin to gain weight and your legs and ankles swell. Summary  After your procedure, it is common to have pain, soreness, and some swelling where the cardiac device was inserted.  Make sure to keep your incision clean and dry. Follow instructions from your health care provider about how to take care of your incision.  Check your incision every day for signs of infection, such  as more pain or swelling, pus or a bad smell, warmth, or leaking fluid and blood.  Avoid strenuous exercise and lifting your left arm higher than your shoulder for 2 weeks, or as long as told by your health care provider. This information is not intended to replace advice given to you by your health care provider. Make sure you discuss any questions you have with your health care provider.

## 2019-02-13 NOTE — Progress Notes (Signed)
Patient informed PCR screen negative

## 2019-02-14 MED FILL — Lidocaine HCl Local Inj 1%: INTRAMUSCULAR | Qty: 60 | Status: AC

## 2019-02-14 MED FILL — Gentamicin Sulfate Inj 40 MG/ML: INTRAMUSCULAR | Qty: 80 | Status: AC

## 2019-02-15 ENCOUNTER — Telehealth: Payer: Self-pay

## 2019-02-15 MED ORDER — LOSARTAN POTASSIUM 25 MG PO TABS: 25.0000 mg | ORAL_TABLET | Freq: Every day | ORAL | 3 refills | Status: DC

## 2019-02-15 NOTE — Telephone Encounter (Signed)
Message sent via MyChart.

## 2019-02-15 NOTE — Telephone Encounter (Signed)
-----   Message from Evans Lance, MD sent at 02/10/2019 10:41 PM EDT ----- Reduce losartan to 25 mg daily

## 2019-02-21 ENCOUNTER — Other Ambulatory Visit: Payer: Self-pay | Admitting: Internal Medicine

## 2019-02-21 DIAGNOSIS — E0840 Diabetes mellitus due to underlying condition with diabetic neuropathy, unspecified: Secondary | ICD-10-CM

## 2019-02-21 MED ORDER — HUMULIN 70/30 (70-30) 100 UNIT/ML ~~LOC~~ SUSP: SUBCUTANEOUS | 3 refills | Status: DC

## 2019-02-26 ENCOUNTER — Ambulatory Visit (INDEPENDENT_AMBULATORY_CARE_PROVIDER_SITE_OTHER): Payer: Medicare Other | Admitting: Student

## 2019-02-26 ENCOUNTER — Other Ambulatory Visit: Payer: Self-pay

## 2019-02-26 DIAGNOSIS — I5022 Chronic systolic (congestive) heart failure: Secondary | ICD-10-CM | POA: Diagnosis not present

## 2019-02-26 LAB — CUP PACEART INCLINIC DEVICE CHECK
Battery Remaining Longevity: 109 mo
Battery Voltage: 3.05 V
Brady Statistic RA Percent Paced: 1.3 %
Brady Statistic RV Percent Paced: 99.11 %
Date Time Interrogation Session: 20201013145554
Implantable Lead Implant Date: 20111202
Implantable Lead Implant Date: 20111202
Implantable Lead Location: 753859
Implantable Lead Location: 753860
Implantable Pulse Generator Implant Date: 20200930
Lead Channel Impedance Value: 437.5 Ohm
Lead Channel Impedance Value: 475 Ohm
Lead Channel Pacing Threshold Amplitude: 0.75 V
Lead Channel Pacing Threshold Amplitude: 0.75 V
Lead Channel Pacing Threshold Amplitude: 1.25 V
Lead Channel Pacing Threshold Pulse Width: 0.4 ms
Lead Channel Pacing Threshold Pulse Width: 0.4 ms
Lead Channel Pacing Threshold Pulse Width: 0.8 ms
Lead Channel Sensing Intrinsic Amplitude: 2.9 mV
Lead Channel Setting Pacing Amplitude: 1.5 V
Lead Channel Setting Pacing Amplitude: 2 V
Lead Channel Setting Pacing Pulse Width: 0.8 ms
Lead Channel Setting Sensing Sensitivity: 4 mV
Pulse Gen Model: 2272
Pulse Gen Serial Number: 9156495

## 2019-02-26 NOTE — Progress Notes (Signed)
Wound check appointment. Steri-strips removed. Wound without redness or edema. Incision edges approximated, wound well healed. Normal device function. Thresholds, sensing, and impedances consistent with implant measurements. Device programmed at appropriate thresholds/auto-capture for chronic leads. Histogram distribution appropriate for patient and level of activity. ROV in 3 months with Dr. Lovena Le

## 2019-03-19 ENCOUNTER — Other Ambulatory Visit: Payer: Self-pay | Admitting: Internal Medicine

## 2019-03-19 DIAGNOSIS — E0840 Diabetes mellitus due to underlying condition with diabetic neuropathy, unspecified: Secondary | ICD-10-CM

## 2019-03-19 MED ORDER — BD PEN NEEDLE SHORT U/F 31G X 8 MM MISC: 2 refills | Status: DC

## 2019-03-20 ENCOUNTER — Other Ambulatory Visit: Payer: Self-pay | Admitting: Internal Medicine

## 2019-03-20 MED ORDER — "INSULIN SYRINGE 31G X 5/16"" 0.5 ML MISC": 2 refills | Status: DC

## 2019-03-20 NOTE — Telephone Encounter (Signed)
Medication Refill - Medication: Insulin Syringe-Needle U-100 (INSULIN SYRINGE .5CC/31GX5/16") 31G X 5/16" 0.5 ML MISC  Has the patient contacted their pharmacy? Yes - states that he takes insulin 4x a day, but the RX states he is only taking 3x a day so he is out of syringes.  Pt is completely out of syringes and he has had to buy some out of pocket to get through the next two days. (Agent: If no, request that the patient contact the pharmacy for the refill.) (Agent: If yes, when and what did the pharmacy advise?)  Preferred Pharmacy (with phone number or street name):  [facility], [place] - [place] [phone] (Phone) [phone] (Fax)   Agent: Please be advised that RX refills may take up to 3 business days. We ask that you follow-up with your pharmacy.

## 2019-03-20 NOTE — Telephone Encounter (Signed)
Requested medication (s) are due for refill today: yes  Requested medication (s) are on the active medication list: yes  Last refill:  04/30/2018  Future visit scheduled: no  Notes to clinic:  states that he takes insulin 4x a day, but the RX states he is only taking 3x a day so he is out of syringes.  Pt is completely out of syringes and he has had to buy some out of pocket to get through the next two days.   Requested Prescriptions  Pending Prescriptions Disp Refills   Insulin Syringe-Needle U-100 (INSULIN SYRINGE .5CC/31GX5/16") 31G X 5/16" 0.5 ML MISC 200 each 2    Sig: USE AS DIRECTED FOUR TIMES DAILY     There is no refill protocol information for this order

## 2019-05-21 ENCOUNTER — Other Ambulatory Visit: Payer: Self-pay

## 2019-05-21 ENCOUNTER — Encounter: Payer: Self-pay | Admitting: Internal Medicine

## 2019-05-21 ENCOUNTER — Ambulatory Visit (INDEPENDENT_AMBULATORY_CARE_PROVIDER_SITE_OTHER): Payer: Medicare PPO | Admitting: Internal Medicine

## 2019-05-21 VITALS — BP 138/76 | HR 81 | Temp 97.9°F | Resp 16 | Ht 68.0 in | Wt 287.0 lb

## 2019-05-21 DIAGNOSIS — E0840 Diabetes mellitus due to underlying condition with diabetic neuropathy, unspecified: Secondary | ICD-10-CM | POA: Diagnosis not present

## 2019-05-21 DIAGNOSIS — I10 Essential (primary) hypertension: Secondary | ICD-10-CM

## 2019-05-21 DIAGNOSIS — Z0000 Encounter for general adult medical examination without abnormal findings: Secondary | ICD-10-CM

## 2019-05-21 DIAGNOSIS — M1A3790 Chronic gout due to renal impairment, unspecified ankle and foot, without tophus (tophi): Secondary | ICD-10-CM

## 2019-05-21 DIAGNOSIS — N1832 Chronic kidney disease, stage 3b: Secondary | ICD-10-CM | POA: Diagnosis not present

## 2019-05-21 DIAGNOSIS — N183 Chronic kidney disease, stage 3 unspecified: Secondary | ICD-10-CM

## 2019-05-21 DIAGNOSIS — M10071 Idiopathic gout, right ankle and foot: Secondary | ICD-10-CM

## 2019-05-21 LAB — URINALYSIS, ROUTINE W REFLEX MICROSCOPIC
Bilirubin Urine: NEGATIVE
Ketones, ur: NEGATIVE
Leukocytes,Ua: NEGATIVE
Nitrite: NEGATIVE
RBC / HPF: NONE SEEN (ref 0–?)
Specific Gravity, Urine: 1.015 (ref 1.000–1.030)
Urine Glucose: 500 — AB
Urobilinogen, UA: 0.2 (ref 0.0–1.0)
pH: 5.5 (ref 5.0–8.0)

## 2019-05-21 LAB — BASIC METABOLIC PANEL
BUN: 44 mg/dL — ABNORMAL HIGH (ref 6–23)
CO2: 28 mEq/L (ref 19–32)
Calcium: 9.2 mg/dL (ref 8.4–10.5)
Chloride: 102 mEq/L (ref 96–112)
Creatinine, Ser: 2.21 mg/dL — ABNORMAL HIGH (ref 0.40–1.50)
GFR: 34.64 mL/min — ABNORMAL LOW (ref >60.00)
Glucose, Bld: 113 mg/dL — ABNORMAL HIGH (ref 70–99)
Potassium: 3.7 mEq/L (ref 3.5–5.1)
Sodium: 139 mEq/L (ref 135–145)

## 2019-05-21 LAB — MICROALBUMIN / CREATININE URINE RATIO
Creatinine,U: 47.9 mg/dL
Microalb Creat Ratio: 36.5 mg/g — ABNORMAL HIGH (ref 0.0–30.0)
Microalb, Ur: 17.5 mg/dL — ABNORMAL HIGH (ref 0.0–1.9)

## 2019-05-21 LAB — HEMOGLOBIN A1C: Hgb A1c MFr Bld: 7.3 % — ABNORMAL HIGH (ref 4.6–6.5)

## 2019-05-21 MED ORDER — CANAGLIFLOZIN 100 MG PO TABS: 100.0000 mg | ORAL_TABLET | Freq: Every day | ORAL | 1 refills | Status: DC

## 2019-05-21 MED ORDER — ALLOPURINOL 100 MG PO TABS: 100.0000 mg | ORAL_TABLET | Freq: Every day | ORAL | 1 refills | Status: DC

## 2019-05-21 NOTE — Progress Notes (Signed)
Subjective:  Patient ID: Connor Dixon, male    DOB: 08/06/1934  Age: 85 y.o. MRN: [id number]  CC: Hypertension, Diabetes, and Annual Exam  This visit occurred during the SARS-CoV-2 public health emergency.  Safety protocols were in place, including screening questions prior to the visit, additional usage of staff PPE, and extensive cleaning of exam room while observing appropriate contact time as indicated for disinfecting solutions.    HPI Connor Dixon presents for a CPX.  He has felt well recently and offers no complaints.  He is active and denies any recent episodes of CP, DOE, palpitations, edema, or fatigue.  He is urinating without difficulty.  He says he only gets up once a night to urinate.  Outpatient Medications Prior to Visit  Medication Sig Dispense Refill  . Aromatic Inhalants (VICKS VAPOINHALER IN) Inhale 1 Dose into the lungs daily as needed (congestion).    . blood glucose meter kit and supplies KIT Dispense what is covered by patients insurance. 1 each 0  . Blood Glucose Monitoring Suppl (ONETOUCH VERIO) w/Device KIT 1 Device by Does not apply route daily. Use as directed 1 kit 0  . calcitRIOL (ROCALTROL) 0.25 MCG capsule Take 0.25 mcg by mouth every other day.   5  . carvedilol (COREG) 6.25 MG tablet TAKE 1 TABLET(6.25 MG) BY MOUTH TWICE DAILY WITH A MEAL (Patient taking differently: Take 6.25 mg by mouth 2 (two) times daily. ) 180 tablet 1  . ELIQUIS 2.5 MG TABS tablet TAKE 1 TABLET BY MOUTH TWICE DAILY (Patient taking differently: Take 2.5 mg by mouth 2 (two) times daily. ) 60 tablet 5  . glucose blood test strip USE TO CHECK BLOOD SUGAR UP TO FOUR TIMES DAILY 500 each 3  . insulin glargine (LANTUS) 100 UNIT/ML injection INJECT 25 UNITS INTO THE SKIN AT BEDTIME (Patient taking differently: Inject 25 Units into the skin at bedtime. ) 30 mL 3  . insulin NPH-regular Human (HUMULIN 70/30) (70-30) 100 UNIT/ML injection ADMINISTER 65 UNITS UNDER THE SKIN DAILY WITH BREAKFAST  20 mL 3  . Insulin Pen Needle (B-D ULTRAFINE III SHORT PEN) 31G X 8 MM MISC USE TO ADMINISTER INSULIN THREE TIMES DAILY 300 each 2  . Insulin Syringe-Needle U-100 (INSULIN SYRINGE .5CC/31GX5/16") 31G X 5/16" 0.5 ML MISC USE AS DIRECTED FOUR TIMES DAILY 200 each 2  . loratadine (CLARITIN) 10 MG tablet Take 10 mg by mouth daily as needed for allergies.    Marland Kitchen losartan (COZAAR) 25 MG tablet Take 25 mg by mouth daily.    Glory Rosebush DELICA LANCETS 95Q MISC TEST THREE TIMES DAILY 200 each 11  . pravastatin (PRAVACHOL) 40 MG tablet TAKE 1 TABLET(40 MG) BY MOUTH DAILY (Patient taking differently: Take 40 mg by mouth every evening. ) 90 tablet 1  . allopurinol (ZYLOPRIM) 100 MG tablet Take 1 tablet (100 mg total) by mouth daily. 90 tablet 1  . canagliflozin (INVOKANA) 100 MG TABS tablet Take 1 tablet (100 mg total) by mouth daily before breakfast. 90 tablet 1  . amoxicillin (AMOXIL) 500 MG capsule TAKE ONE CAPSULE BY MOUTH THREE TIMES DAILY. TAKE ONLY WHEN HAVING DENTAL PROCEDURES (Patient not taking: Take 2000 mg 1 hour prior to dental work) 30 capsule 2  . furosemide (LASIX) 40 MG tablet Take 1 tablet (40 mg total) by mouth 2 (two) times daily. 180 tablet 3  . losartan (COZAAR) 25 MG tablet Take 1 tablet (25 mg total) by mouth daily. 90 tablet 3  . losartan (  COZAAR) 50 MG tablet Take 50 mg by mouth daily.     No facility-administered medications prior to visit.    ROS Review of Systems  Constitutional: Positive for unexpected weight change (wt gain). Negative for appetite change, diaphoresis and fatigue.  HENT: Negative.   Eyes: Negative.   Respiratory: Negative.  Negative for cough, chest tightness, shortness of breath and wheezing.   Cardiovascular: Negative for chest pain, palpitations and leg swelling.  Gastrointestinal: Negative for abdominal pain, constipation, diarrhea, nausea and vomiting.  Endocrine: Negative.  Negative for polydipsia, polyphagia and polyuria.  Genitourinary: Negative.   Negative for difficulty urinating, dysuria, hematuria and urgency.  Musculoskeletal: Negative for arthralgias and myalgias.  Skin: Negative.  Negative for color change and pallor.  Neurological: Negative for dizziness, weakness and light-headedness.  Hematological: Negative for adenopathy. Does not bruise/bleed easily.  Psychiatric/Behavioral: Negative.     Objective:  BP 138/76 (BP Location: Left Arm, Patient Position: Sitting, Cuff Size: Large)   Pulse 81   Temp 97.9 F (36.6 C) (Oral)   Resp 16   Ht '5\' 8"'  (1.727 m)   Wt 287 lb (130.2 kg)   SpO2 93%   BMI 43.64 kg/m   BP Readings from Last 3 Encounters:  05/21/19 138/76  02/13/19 101/87  02/08/19 118/74    Wt Readings from Last 3 Encounters:  05/21/19 287 lb (130.2 kg)  02/13/19 285 lb (129.3 kg)  02/08/19 285 lb 6.4 oz (129.5 kg)    Physical Exam Constitutional:      Appearance: Normal appearance. He is obese.  HENT:     Nose: Nose normal.     Mouth/Throat:     Mouth: Mucous membranes are moist.  Eyes:     General: No scleral icterus.    Conjunctiva/sclera: Conjunctivae normal.  Cardiovascular:     Rate and Rhythm: Normal rate and regular rhythm.     Heart sounds: Murmur present. Systolic murmur present with a grade of 1/6.  Pulmonary:     Effort: Pulmonary effort is normal.     Breath sounds: No stridor. No wheezing, rhonchi or rales.  Abdominal:     General: Abdomen is protuberant. Bowel sounds are normal. There is no distension.     Palpations: Abdomen is soft. There is no hepatomegaly or splenomegaly.     Tenderness: There is no abdominal tenderness.     Hernia: No hernia is present.  Musculoskeletal:     Cervical back: Neck supple.     Right lower leg: Edema (trace pitting) present.     Left lower leg: Edema (trace pitting) present.  Lymphadenopathy:     Cervical: No cervical adenopathy.  Skin:    General: Skin is warm and dry.  Neurological:     General: No focal deficit present.     Mental  Status: He is alert.  Psychiatric:        Mood and Affect: Mood normal.     Lab Results  Component Value Date   WBC 8.1 02/08/2019   HGB 15.9 02/08/2019   HCT 47.7 02/08/2019   PLT 191 02/08/2019   GLUCOSE 113 (H) 05/21/2019   CHOL 135 11/26/2018   TRIG 82.0 11/26/2018   HDL 45.60 11/26/2018   LDLCALC 73 11/26/2018   ALT 16 10/09/2018   AST 23 10/09/2018   NA 139 05/21/2019   K 3.7 05/21/2019   CL 102 05/21/2019   CREATININE 2.21 (H) 05/21/2019   BUN 44 (H) 05/21/2019   CO2 28 05/21/2019   TSH  1.45 03/14/2018   PSA 1.77 01/06/2010   HGBA1C 7.3 (H) 05/21/2019   MICROALBUR 17.5 (H) 05/21/2019    No results found.  Assessment & Plan:   Traveon was seen today for hypertension, diabetes and annual exam.  Diagnoses and all orders for this visit:  Essential hypertension- His BP is adequately well controlled. -     Basic metabolic panel  Idiopathic gout of right foot, unspecified chronicity- He has had no recent gout flares and has achieved his uric acid goal.  Will continue the current dose of allopurinol. -     allopurinol (ZYLOPRIM) 100 MG tablet; Take 1 tablet (100 mg total) by mouth daily.  Stage 3b chronic kidney disease- His renal function is stable.  He agrees to avoid nephrotoxic agents. -     Basic metabolic panel -     Urinalysis, Routine w reflex microscopic  Diabetes mellitus due to underlying condition with diabetic neuropathy, without long-term current use of insulin ([place])- His A1c is at 7.3%.  His blood sugars are adequately well controlled. -     Microalbumin / creatinine urine ratio -     Hemoglobin A1c -     HM Diabetes Foot Exam -     canagliflozin (INVOKANA) 100 MG TABS tablet; Take 1 tablet (100 mg total) by mouth daily before breakfast.  Chronic kidney disease, stage III (moderate) -     canagliflozin (INVOKANA) 100 MG TABS tablet; Take 1 tablet (100 mg total) by mouth daily before breakfast.  Chronic gout due to renal impairment involving foot  without tophus, unspecified laterality -     allopurinol (ZYLOPRIM) 100 MG tablet; Take 1 tablet (100 mg total) by mouth daily.   I am having Mallie Mussel C. Siverson maintain his amoxicillin, furosemide, OneTouch Delica Lancets 79U, blood glucose meter kit and supplies, OneTouch Verio, calcitRIOL, insulin glargine, glucose blood, carvedilol, pravastatin, Eliquis, loratadine, Aromatic Inhalants (VICKS VAPOINHALER IN), HumuLIN 70/30, B-D ULTRAFINE III SHORT PEN, INSULIN SYRINGE .5CC/31GX5/16", losartan, canagliflozin, and allopurinol.  Meds ordered this encounter  Medications  . canagliflozin (INVOKANA) 100 MG TABS tablet    Sig: Take 1 tablet (100 mg total) by mouth daily before breakfast.    Dispense:  90 tablet    Refill:  1  . allopurinol (ZYLOPRIM) 100 MG tablet    Sig: Take 1 tablet (100 mg total) by mouth daily.    Dispense:  90 tablet    Refill:  1     Follow-up: Return in about 6 months (around 11/18/2019).  Scarlette Calico, MD

## 2019-05-21 NOTE — Patient Instructions (Signed)

## 2019-05-22 ENCOUNTER — Encounter: Payer: Self-pay | Admitting: Internal Medicine

## 2019-05-23 NOTE — Assessment & Plan Note (Signed)
Exam completed Labs reviewed Vaccines reviewed No cancer screenings are indicated Patient education was given. 

## 2019-05-28 ENCOUNTER — Ambulatory Visit: Payer: Medicare PPO | Admitting: Internal Medicine

## 2019-05-28 ENCOUNTER — Encounter: Payer: Self-pay | Admitting: Internal Medicine

## 2019-05-28 ENCOUNTER — Other Ambulatory Visit: Payer: Self-pay

## 2019-05-28 VITALS — BP 140/82 | HR 92 | Ht 68.0 in | Wt 282.6 lb

## 2019-05-28 DIAGNOSIS — Z950 Presence of cardiac pacemaker: Secondary | ICD-10-CM

## 2019-05-28 DIAGNOSIS — I10 Essential (primary) hypertension: Secondary | ICD-10-CM | POA: Diagnosis not present

## 2019-05-28 DIAGNOSIS — I442 Atrioventricular block, complete: Secondary | ICD-10-CM

## 2019-05-28 NOTE — Progress Notes (Signed)
HPI Connor Dixon returns today for followup. He is a pleasant 85 yo man with obesity, chronic systolic heart failure, HTN, and CHB, s/p PPM insertion. He also PAF.  Allergies  Allergen Reactions  . Oysters [Shellfish Allergy]     Blisters and swelling  . Apple Other (See Comments)    Sore throat     Current Outpatient Medications  Medication Sig Dispense Refill  . allopurinol (ZYLOPRIM) 100 MG tablet Take 1 tablet (100 mg total) by mouth daily. 90 tablet 1  . amoxicillin (AMOXIL) 500 MG capsule TAKE ONE CAPSULE BY MOUTH THREE TIMES DAILY. TAKE ONLY WHEN HAVING DENTAL PROCEDURES 30 capsule 2  . Aromatic Inhalants (VICKS VAPOINHALER IN) Inhale 1 Dose into the lungs daily as needed (congestion).    . blood glucose meter kit and supplies KIT Dispense what is covered by patients insurance. 1 each 0  . Blood Glucose Monitoring Suppl (ONETOUCH VERIO) w/Device KIT 1 Device by Does not apply route daily. Use as directed 1 kit 0  . calcitRIOL (ROCALTROL) 0.25 MCG capsule Take 0.25 mcg by mouth every other day.   5  . canagliflozin (INVOKANA) 100 MG TABS tablet Take 1 tablet (100 mg total) by mouth daily before breakfast. 90 tablet 1  . carvedilol (COREG) 6.25 MG tablet TAKE 1 TABLET(6.25 MG) BY MOUTH TWICE DAILY WITH A MEAL 180 tablet 1  . ELIQUIS 2.5 MG TABS tablet TAKE 1 TABLET BY MOUTH TWICE DAILY 60 tablet 5  . glucose blood test strip USE TO CHECK BLOOD SUGAR UP TO FOUR TIMES DAILY 500 each 3  . insulin glargine (LANTUS) 100 UNIT/ML injection INJECT 25 UNITS INTO THE SKIN AT BEDTIME 30 mL 3  . insulin NPH-regular Human (HUMULIN 70/30) (70-30) 100 UNIT/ML injection ADMINISTER 65 UNITS UNDER THE SKIN DAILY WITH BREAKFAST 20 mL 3  . Insulin Pen Needle (B-D ULTRAFINE III SHORT PEN) 31G X 8 MM MISC USE TO ADMINISTER INSULIN THREE TIMES DAILY 300 each 2  . Insulin Syringe-Needle U-100 (INSULIN SYRINGE .5CC/31GX5/16") 31G X 5/16" 0.5 ML MISC USE AS DIRECTED FOUR TIMES DAILY 200 each 2  .  loratadine (CLARITIN) 10 MG tablet Take 10 mg by mouth daily as needed for allergies.    Marland Kitchen losartan (COZAAR) 25 MG tablet Take 25 mg by mouth daily.    Glory Rosebush DELICA LANCETS 35K MISC TEST THREE TIMES DAILY 200 each 11  . pravastatin (PRAVACHOL) 40 MG tablet TAKE 1 TABLET(40 MG) BY MOUTH DAILY 90 tablet 1  . furosemide (LASIX) 40 MG tablet Take 1 tablet (40 mg total) by mouth 2 (two) times daily. 180 tablet 3   No current facility-administered medications for this visit.     Past Medical History:  Diagnosis Date  . Acute on chronic diastolic congestive heart failure ([place])   . Arthus phenomenon   . Atrioventricular block, complete ([place])   . Cardiac pacemaker in situ 07/22/2010  . Chronic kidney disease, stage 3    Presumably from longstanding DM & HTN. Scr has gradually increased over the past 2 years (1.6 in 12/2010, 2 in 03/2011, and up to 2.1 in 01/2013)  . DIABETES MELLITUS, TYPE I, ADULT ONSET 08/06/2007  . DM (diabetes mellitus) ([place])    adult onset   . Essential hypertension, benign 08/06/2007   Very good BP control  . History of second degree heart block   . HLD (hyperlipidemia)   . HYPERLIPIDEMIA 08/06/2007  . Hypopotassemia   . Morbid obesity ([place])   .  Obesity   . Other and unspecified angina pectoris    typical  . Type I (juvenile type) diabetes mellitus without mention of complication, not stated as uncontrolled    (04/25/13 OV with Dr. Marval Regal) = Poorly controlled & is working  w/Dr. Crissie Sickles to improve this. Most recent Hgb A1c was 8.2% but Mr. Mohon reports it was down to 7.6 last month.    ROS:   All systems reviewed and negative except as noted in the HPI.   Past Surgical History:  Procedure Laterality Date  . CATARACT EXTRACTION  8/08   [place]  . PPM GENERATOR CHANGEOUT N/A 02/13/2019   Procedure: PPM GENERATOR CHANGEOUT;  Surgeon: Evans Lance, MD;  Location: [facility];  Service: Cardiovascular;  Laterality: N/A;  . PTVDP  12/11   PPM  - St. Jude  . TOTAL HIP ARTHROPLASTY  04/05/06   right     Family History  Problem Relation Age of Onset  . Asthma Father   . Coronary artery disease Father   . Diabetes Father   . Hyperlipidemia Father   . Stroke Father 18  . Coronary artery disease Mother   . Asthma Mother   . Heart attack Mother 36  . Colon cancer Neg Hx        no definate prostate CA      Social History   Socioeconomic History  . Marital status: Widowed    Spouse name: Not on file  . Number of children: 2  . Years of education: Not on file  . Highest education level: Not on file  Occupational History  . Occupation: retired  Tobacco Use  . Smoking status: Never Smoker  . Smokeless tobacco: Never Used  Substance and Sexual Activity  . Alcohol use: No  . Drug use: No  . Sexual activity: Not Currently  Other Topics Concern  . Not on file  Social History Narrative   [employer] BA, 2 Master's degrees. [employer] - for certification Education specialist.    Retired Tree surgeon.   Active - travels.   Married - 1961; 2 sons (Collins) and 5 grandchildren.        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    Social Determinants of Health   Financial Resource Strain:   . Difficulty of Paying Living Expenses: Not on file  Food Insecurity:   . Worried About Charity fundraiser in the Last Year: Not on file  . Ran Out of Food in the Last Year: Not on file  Transportation Needs:   . Lack of Transportation (Medical): Not on file  . Lack of Transportation (Non-Medical): Not on file  Physical Activity: Insufficiently Active  . Days of Exercise per Week: 3 days  . Minutes of Exercise per Session: 30 min  Stress: No Stress Concern Present  . Feeling of Stress : Not at all  Social Connections:   . Frequency of Communication with Friends and Family: Not on file  . Frequency of Social Gatherings with Friends and Family: Not on file  . Attends Religious Services: Not on file  . Active  Member of Clubs or Organizations: Not on file  . Attends Archivist Meetings: Not on file  . Marital Status: Not on file  Intimate Partner Violence: Not At Risk  . Fear of Current or Ex-Partner: No  . Emotionally Abused: No  . Physically Abused: No  . Sexually Abused: No     BP 140/82  Pulse 92   Ht '5\' 8"'  (1.727 m)   Wt 282 lb 9.6 oz (128.2 kg)   SpO2 94%   BMI 42.97 kg/m   Physical Exam:  Well appearing NAD HEENT: Unremarkable Neck:  No JVD, no thyromegally Lymphatics:  No adenopathy Back:  No CVA tenderness Lungs:  Clear with no wheezes HEART:  Regular rate rhythm, no murmurs, no rubs, no clicks Abd:  soft, positive bowel sounds, no organomegally, no rebound, no guarding Ext:  2 plus pulses, no edema, no cyanosis, no clubbing Skin:  No rashes no nodules Neuro:  CN II through XII intact, motor grossly intact  EKG - NSR with pacing induced LBBB  DEVICE  Normal device function.  See PaceArt for details.   Assess/Plan: 1. CHB - he is asymptomatic,s/p PPM insertion. 2. PPM - his St. Jude DDD PM is working normally.  3. PAF - he is maintaining NSR.  4. HTN - his sbp is a little elevated. He is encouraged to lose weight.   Mikle Bosworth.D.

## 2019-05-28 NOTE — Patient Instructions (Signed)

## 2019-06-04 DIAGNOSIS — N1831 Chronic kidney disease, stage 3a: Secondary | ICD-10-CM | POA: Diagnosis not present

## 2019-06-04 DIAGNOSIS — N189 Chronic kidney disease, unspecified: Secondary | ICD-10-CM | POA: Diagnosis not present

## 2019-06-17 ENCOUNTER — Other Ambulatory Visit: Payer: Self-pay | Admitting: Internal Medicine

## 2019-06-17 DIAGNOSIS — E785 Hyperlipidemia, unspecified: Secondary | ICD-10-CM

## 2019-06-17 DIAGNOSIS — I10 Essential (primary) hypertension: Secondary | ICD-10-CM

## 2019-06-19 ENCOUNTER — Other Ambulatory Visit: Payer: Self-pay | Admitting: Internal Medicine

## 2019-06-19 DIAGNOSIS — E0840 Diabetes mellitus due to underlying condition with diabetic neuropathy, unspecified: Secondary | ICD-10-CM

## 2019-06-19 MED ORDER — HUMULIN 70/30 (70-30) 100 UNIT/ML ~~LOC~~ SUSP: SUBCUTANEOUS | 3 refills | Status: DC

## 2019-06-30 ENCOUNTER — Other Ambulatory Visit: Payer: Self-pay | Admitting: Internal Medicine

## 2019-06-30 DIAGNOSIS — I10 Essential (primary) hypertension: Secondary | ICD-10-CM

## 2019-07-01 ENCOUNTER — Other Ambulatory Visit: Payer: Self-pay | Admitting: Internal Medicine

## 2019-07-01 ENCOUNTER — Telehealth: Payer: Self-pay

## 2019-07-01 DIAGNOSIS — I10 Essential (primary) hypertension: Secondary | ICD-10-CM

## 2019-07-01 DIAGNOSIS — E0840 Diabetes mellitus due to underlying condition with diabetic neuropathy, unspecified: Secondary | ICD-10-CM

## 2019-07-01 DIAGNOSIS — M10071 Idiopathic gout, right ankle and foot: Secondary | ICD-10-CM

## 2019-07-01 MED ORDER — LOSARTAN POTASSIUM 25 MG PO TABS: 25.0000 mg | ORAL_TABLET | Freq: Every day | ORAL | 1 refills | Status: DC

## 2019-07-01 NOTE — Telephone Encounter (Signed)
New message  Last seen  1.5.2021   1. Which medications need to be refilled? (please list name of each medication and dose if known) losartan (COZAAR) 25 MG tablet  2. Which pharmacy/location (including street and city if local pharmacy) is medication to be sent to? [facility] on [address]   3. Do they need a 30 day or 90 day supply? 90 day supply

## 2019-07-11 DIAGNOSIS — I5033 Acute on chronic diastolic (congestive) heart failure: Secondary | ICD-10-CM | POA: Diagnosis not present

## 2019-07-11 DIAGNOSIS — I129 Hypertensive chronic kidney disease with stage 1 through stage 4 chronic kidney disease, or unspecified chronic kidney disease: Secondary | ICD-10-CM | POA: Diagnosis not present

## 2019-07-11 DIAGNOSIS — E1165 Type 2 diabetes mellitus with hyperglycemia: Secondary | ICD-10-CM | POA: Diagnosis not present

## 2019-07-11 DIAGNOSIS — E785 Hyperlipidemia, unspecified: Secondary | ICD-10-CM | POA: Diagnosis not present

## 2019-07-11 DIAGNOSIS — E1122 Type 2 diabetes mellitus with diabetic chronic kidney disease: Secondary | ICD-10-CM | POA: Diagnosis not present

## 2019-07-11 DIAGNOSIS — N2581 Secondary hyperparathyroidism of renal origin: Secondary | ICD-10-CM | POA: Diagnosis not present

## 2019-07-11 DIAGNOSIS — N1831 Chronic kidney disease, stage 3a: Secondary | ICD-10-CM | POA: Diagnosis not present

## 2019-07-13 ENCOUNTER — Other Ambulatory Visit: Payer: Self-pay | Admitting: Internal Medicine

## 2019-07-15 LAB — CUP PACEART INCLINIC DEVICE CHECK
Battery Remaining Longevity: 103 mo
Battery Voltage: 3.01 V
Brady Statistic RA Percent Paced: 0.84 %
Brady Statistic RV Percent Paced: 99 %
Date Time Interrogation Session: 20210112141100
Implantable Lead Implant Date: 20111202
Implantable Lead Implant Date: 20111202
Implantable Lead Location: 753859
Implantable Lead Location: 753860
Implantable Pulse Generator Implant Date: 20200930
Lead Channel Impedance Value: 425 Ohm
Lead Channel Impedance Value: 475 Ohm
Lead Channel Pacing Threshold Amplitude: 0.75 V
Lead Channel Pacing Threshold Amplitude: 1.375 V
Lead Channel Pacing Threshold Pulse Width: 0.4 ms
Lead Channel Pacing Threshold Pulse Width: 0.8 ms
Lead Channel Sensing Intrinsic Amplitude: 2.4 mV
Lead Channel Setting Pacing Amplitude: 1.625
Lead Channel Setting Pacing Amplitude: 2 V
Lead Channel Setting Pacing Pulse Width: 0.8 ms
Lead Channel Setting Sensing Sensitivity: 4 mV
Pulse Gen Model: 2272
Pulse Gen Serial Number: 9156495

## 2019-07-17 ENCOUNTER — Other Ambulatory Visit: Payer: Self-pay | Admitting: Internal Medicine

## 2019-07-17 NOTE — Telephone Encounter (Signed)
Prescription refill request for Eliquis received.  Last office visit: 1/12/2021Ladona Dixon Scr:  2.21, 05/21/2019 Age: 85 y.o. Weight: 128.2 kg   Prescription refill sent.

## 2019-07-18 ENCOUNTER — Encounter: Payer: Self-pay | Admitting: *Deleted

## 2019-07-18 NOTE — Telephone Encounter (Signed)
This encounter was created in error - please disregard.

## 2019-08-07 ENCOUNTER — Other Ambulatory Visit: Payer: Self-pay | Admitting: Internal Medicine

## 2019-08-09 ENCOUNTER — Other Ambulatory Visit: Payer: Self-pay | Admitting: Internal Medicine

## 2019-08-09 DIAGNOSIS — E0840 Diabetes mellitus due to underlying condition with diabetic neuropathy, unspecified: Secondary | ICD-10-CM

## 2019-08-22 DIAGNOSIS — Z7901 Long term (current) use of anticoagulants: Secondary | ICD-10-CM | POA: Diagnosis not present

## 2019-08-22 DIAGNOSIS — Z823 Family history of stroke: Secondary | ICD-10-CM | POA: Diagnosis not present

## 2019-08-22 DIAGNOSIS — Z89022 Acquired absence of left finger(s): Secondary | ICD-10-CM | POA: Diagnosis not present

## 2019-08-22 DIAGNOSIS — I2510 Atherosclerotic heart disease of native coronary artery without angina pectoris: Secondary | ICD-10-CM | POA: Diagnosis not present

## 2019-08-22 DIAGNOSIS — Z87891 Personal history of nicotine dependence: Secondary | ICD-10-CM | POA: Diagnosis not present

## 2019-08-22 DIAGNOSIS — G8929 Other chronic pain: Secondary | ICD-10-CM | POA: Diagnosis not present

## 2019-08-22 DIAGNOSIS — Z794 Long term (current) use of insulin: Secondary | ICD-10-CM | POA: Diagnosis not present

## 2019-08-22 DIAGNOSIS — E1122 Type 2 diabetes mellitus with diabetic chronic kidney disease: Secondary | ICD-10-CM | POA: Diagnosis not present

## 2019-08-22 DIAGNOSIS — I130 Hypertensive heart and chronic kidney disease with heart failure and stage 1 through stage 4 chronic kidney disease, or unspecified chronic kidney disease: Secondary | ICD-10-CM | POA: Diagnosis not present

## 2019-08-22 DIAGNOSIS — E785 Hyperlipidemia, unspecified: Secondary | ICD-10-CM | POA: Diagnosis not present

## 2019-08-22 DIAGNOSIS — Z96649 Presence of unspecified artificial hip joint: Secondary | ICD-10-CM | POA: Diagnosis not present

## 2019-08-22 DIAGNOSIS — Z6841 Body Mass Index (BMI) 40.0 and over, adult: Secondary | ICD-10-CM | POA: Diagnosis not present

## 2019-08-22 DIAGNOSIS — I509 Heart failure, unspecified: Secondary | ICD-10-CM | POA: Diagnosis not present

## 2019-08-22 DIAGNOSIS — Z8249 Family history of ischemic heart disease and other diseases of the circulatory system: Secondary | ICD-10-CM | POA: Diagnosis not present

## 2019-08-22 DIAGNOSIS — Z950 Presence of cardiac pacemaker: Secondary | ICD-10-CM | POA: Diagnosis not present

## 2019-08-22 DIAGNOSIS — Z833 Family history of diabetes mellitus: Secondary | ICD-10-CM | POA: Diagnosis not present

## 2019-08-23 ENCOUNTER — Ambulatory Visit (INDEPENDENT_AMBULATORY_CARE_PROVIDER_SITE_OTHER): Payer: Medicare PPO | Admitting: *Deleted

## 2019-08-23 DIAGNOSIS — I442 Atrioventricular block, complete: Secondary | ICD-10-CM

## 2019-08-23 LAB — CUP PACEART REMOTE DEVICE CHECK
Battery Remaining Longevity: 98 mo
Battery Remaining Percentage: 95.5 %
Battery Voltage: 3.01 V
Brady Statistic AP VP Percent: 2 %
Brady Statistic AP VS Percent: 1 %
Brady Statistic AS VP Percent: 97 %
Brady Statistic AS VS Percent: 1 %
Brady Statistic RA Percent Paced: 1 %
Brady Statistic RV Percent Paced: 99 %
Date Time Interrogation Session: 20210409051548
Implantable Lead Implant Date: 20111202
Implantable Lead Implant Date: 20111202
Implantable Lead Location: 753859
Implantable Lead Location: 753860
Implantable Pulse Generator Implant Date: 20200930
Lead Channel Impedance Value: 400 Ohm
Lead Channel Impedance Value: 450 Ohm
Lead Channel Pacing Threshold Amplitude: 0.75 V
Lead Channel Pacing Threshold Amplitude: 1.625 V
Lead Channel Pacing Threshold Pulse Width: 0.4 ms
Lead Channel Pacing Threshold Pulse Width: 0.8 ms
Lead Channel Sensing Intrinsic Amplitude: 1.9 mV
Lead Channel Sensing Intrinsic Amplitude: 9.3 mV
Lead Channel Setting Pacing Amplitude: 1.875
Lead Channel Setting Pacing Amplitude: 2 V
Lead Channel Setting Pacing Pulse Width: 0.8 ms
Lead Channel Setting Sensing Sensitivity: 4 mV
Pulse Gen Model: 2272
Pulse Gen Serial Number: 9156495

## 2019-08-23 NOTE — Progress Notes (Signed)
PPM Remote  

## 2019-09-26 DIAGNOSIS — H04123 Dry eye syndrome of bilateral lacrimal glands: Secondary | ICD-10-CM | POA: Diagnosis not present

## 2019-09-26 DIAGNOSIS — H532 Diplopia: Secondary | ICD-10-CM | POA: Diagnosis not present

## 2019-09-26 DIAGNOSIS — E113293 Type 2 diabetes mellitus with mild nonproliferative diabetic retinopathy without macular edema, bilateral: Secondary | ICD-10-CM | POA: Diagnosis not present

## 2019-09-26 DIAGNOSIS — H52203 Unspecified astigmatism, bilateral: Secondary | ICD-10-CM | POA: Diagnosis not present

## 2019-09-26 LAB — HM DIABETES EYE EXAM

## 2019-09-30 DIAGNOSIS — N1831 Chronic kidney disease, stage 3a: Secondary | ICD-10-CM | POA: Diagnosis not present

## 2019-09-30 DIAGNOSIS — N189 Chronic kidney disease, unspecified: Secondary | ICD-10-CM | POA: Diagnosis not present

## 2019-10-08 ENCOUNTER — Encounter: Payer: Self-pay | Admitting: Internal Medicine

## 2019-10-13 ENCOUNTER — Other Ambulatory Visit: Payer: Self-pay | Admitting: Internal Medicine

## 2019-10-13 DIAGNOSIS — E0840 Diabetes mellitus due to underlying condition with diabetic neuropathy, unspecified: Secondary | ICD-10-CM

## 2019-11-12 ENCOUNTER — Telehealth: Payer: Self-pay | Admitting: Internal Medicine

## 2019-11-12 DIAGNOSIS — N183 Chronic kidney disease, stage 3 unspecified: Secondary | ICD-10-CM

## 2019-11-12 DIAGNOSIS — M1A3790 Chronic gout due to renal impairment, unspecified ankle and foot, without tophus (tophi): Secondary | ICD-10-CM

## 2019-11-12 DIAGNOSIS — M10071 Idiopathic gout, right ankle and foot: Secondary | ICD-10-CM

## 2019-11-12 DIAGNOSIS — E0840 Diabetes mellitus due to underlying condition with diabetic neuropathy, unspecified: Secondary | ICD-10-CM

## 2019-11-22 ENCOUNTER — Ambulatory Visit (INDEPENDENT_AMBULATORY_CARE_PROVIDER_SITE_OTHER): Payer: Medicare PPO | Admitting: *Deleted

## 2019-11-22 DIAGNOSIS — I442 Atrioventricular block, complete: Secondary | ICD-10-CM

## 2019-11-22 LAB — CUP PACEART REMOTE DEVICE CHECK
Battery Remaining Longevity: 98 mo
Battery Remaining Percentage: 95.5 %
Battery Voltage: 3.01 V
Brady Statistic AP VP Percent: 2.1 %
Brady Statistic AP VS Percent: 1 %
Brady Statistic AS VP Percent: 97 %
Brady Statistic AS VS Percent: 1 %
Brady Statistic RA Percent Paced: 1 %
Brady Statistic RV Percent Paced: 99 %
Date Time Interrogation Session: 20210709060040
Implantable Lead Implant Date: 20111202
Implantable Lead Implant Date: 20111202
Implantable Lead Location: 753859
Implantable Lead Location: 753860
Implantable Pulse Generator Implant Date: 20200930
Lead Channel Impedance Value: 410 Ohm
Lead Channel Impedance Value: 450 Ohm
Lead Channel Pacing Threshold Amplitude: 0.75 V
Lead Channel Pacing Threshold Amplitude: 1.625 V
Lead Channel Pacing Threshold Pulse Width: 0.4 ms
Lead Channel Pacing Threshold Pulse Width: 0.8 ms
Lead Channel Sensing Intrinsic Amplitude: 1.8 mV
Lead Channel Sensing Intrinsic Amplitude: 9.3 mV
Lead Channel Setting Pacing Amplitude: 1.875
Lead Channel Setting Pacing Amplitude: 2 V
Lead Channel Setting Pacing Pulse Width: 0.8 ms
Lead Channel Setting Sensing Sensitivity: 4 mV
Pulse Gen Model: 2272
Pulse Gen Serial Number: 9156495

## 2019-11-25 NOTE — Progress Notes (Signed)
Remote pacemaker transmission.   

## 2019-11-28 ENCOUNTER — Other Ambulatory Visit: Payer: Self-pay | Admitting: Internal Medicine

## 2019-12-09 ENCOUNTER — Other Ambulatory Visit: Payer: Self-pay | Admitting: Internal Medicine

## 2019-12-09 DIAGNOSIS — M10071 Idiopathic gout, right ankle and foot: Secondary | ICD-10-CM

## 2019-12-09 DIAGNOSIS — M1A3790 Chronic gout due to renal impairment, unspecified ankle and foot, without tophus (tophi): Secondary | ICD-10-CM

## 2019-12-09 MED ORDER — CANAGLIFLOZIN 100 MG PO TABS: 100.0000 mg | ORAL_TABLET | Freq: Every day | ORAL | 1 refills | Status: DC

## 2019-12-09 NOTE — Addendum Note (Signed)
Addended by: Radford Pax M on: 12/09/2019 10:19 AM   Modules accepted: Orders

## 2019-12-09 NOTE — Telephone Encounter (Signed)
    1.Medication Requested:canagliflozin (INVOKANA) 100 MG TABS tablet  2. Pharmacy (Name, Street, [place]): [facility]  3. On Med List: yes  4. Last Visit with PCP:   5. Next visit date with PCP: 12/10/19   Agent: Please be advised that RX refills may take up to 3 business days. We ask that you follow-up with your pharmacy.

## 2019-12-09 NOTE — Telephone Encounter (Signed)
Erx sent

## 2019-12-10 ENCOUNTER — Encounter: Payer: Self-pay | Admitting: Internal Medicine

## 2019-12-10 ENCOUNTER — Ambulatory Visit: Payer: Medicare PPO | Admitting: Internal Medicine

## 2019-12-10 ENCOUNTER — Other Ambulatory Visit: Payer: Self-pay

## 2019-12-10 VITALS — BP 130/62 | HR 72 | Temp 98.1°F | Resp 16 | Ht 68.0 in | Wt 271.0 lb

## 2019-12-10 DIAGNOSIS — N1832 Chronic kidney disease, stage 3b: Secondary | ICD-10-CM

## 2019-12-10 DIAGNOSIS — I10 Essential (primary) hypertension: Secondary | ICD-10-CM

## 2019-12-10 DIAGNOSIS — E785 Hyperlipidemia, unspecified: Secondary | ICD-10-CM

## 2019-12-10 DIAGNOSIS — E0840 Diabetes mellitus due to underlying condition with diabetic neuropathy, unspecified: Secondary | ICD-10-CM

## 2019-12-10 DIAGNOSIS — M10071 Idiopathic gout, right ankle and foot: Secondary | ICD-10-CM | POA: Diagnosis not present

## 2019-12-10 DIAGNOSIS — Z0000 Encounter for general adult medical examination without abnormal findings: Secondary | ICD-10-CM

## 2019-12-10 MED ORDER — INSULIN GLARGINE 100 UNIT/ML ~~LOC~~ SOLN: SUBCUTANEOUS | 1 refills | Status: DC

## 2019-12-10 MED ORDER — LOSARTAN POTASSIUM 25 MG PO TABS: 25.0000 mg | ORAL_TABLET | Freq: Every day | ORAL | 1 refills | Status: DC

## 2019-12-10 MED ORDER — CARVEDILOL 6.25 MG PO TABS: 6.2500 mg | ORAL_TABLET | Freq: Two times a day (BID) | ORAL | 1 refills | Status: DC

## 2019-12-10 MED ORDER — PRAVASTATIN SODIUM 40 MG PO TABS: 40.0000 mg | ORAL_TABLET | Freq: Every day | ORAL | 1 refills | Status: DC

## 2019-12-10 NOTE — Progress Notes (Signed)
Subjective:  Patient ID: Connor Dixon, male    DOB: October 31, 1934  Age: 85 y.o. MRN: [id number]  CC: Hypertension, Diabetes, and Hyperlipidemia  This visit occurred during the SARS-CoV-2 public health emergency.  Safety protocols were in place, including screening questions prior to the visit, additional usage of staff PPE, and extensive cleaning of exam room while observing appropriate contact time as indicated for disinfecting solutions.    HPI CEASAR DECANDIA presents for f/up - He has rare symptomatic hypoglycemic episodes that he recognizes and treats with glucose.  He is also able to adjust his insulin so that they do not recur.  He has intentionally lost weight with lifestyle modifications.  He has a stable degree of shortness of breath.  He is not very active but denies chest pain, diaphoresis, dizziness, lightheadedness, or lower extremity edema.  Outpatient Medications Prior to Visit  Medication Sig Dispense Refill  . allopurinol (ZYLOPRIM) 100 MG tablet TAKE 1 TABLET(100 MG) BY MOUTH DAILY 90 tablet 1  . blood glucose meter kit and supplies KIT Dispense what is covered by patients insurance. 1 each 0  . Blood Glucose Monitoring Suppl (ONETOUCH VERIO) w/Device KIT 1 Device by Does not apply route daily. Use as directed 1 kit 0  . calcitRIOL (ROCALTROL) 0.25 MCG capsule Take 0.25 mcg by mouth every other day.   5  . canagliflozin (INVOKANA) 100 MG TABS tablet Take 1 tablet (100 mg total) by mouth daily before breakfast. 90 tablet 1  . ELIQUIS 2.5 MG TABS tablet TAKE 1 TABLET BY MOUTH TWICE DAILY 60 tablet 5  . Insulin Pen Needle (B-D ULTRAFINE III SHORT PEN) 31G X 8 MM MISC USE TO ADMINISTER INSULIN THREE TIMES DAILY 300 each 2  . Insulin Syringe-Needle U-100 (INSULIN SYRINGE .5CC/31GX5/16") 31G X 5/16" 0.5 ML MISC USE AS DIRECTED FOUR TIMES DAILY 200 each 2  . loratadine (CLARITIN) 10 MG tablet Take 10 mg by mouth daily as needed for allergies.    Glory Rosebush DELICA LANCETS 66Q MISC  TEST THREE TIMES DAILY 200 each 11  . ONETOUCH VERIO test strip USE TO CHECK BLOOD SUGAR UP TO FOUR TIMES DAILY 500 strip 1  . Aromatic Inhalants (VICKS VAPOINHALER IN) Inhale 1 Dose into the lungs daily as needed (congestion).    . carvedilol (COREG) 6.25 MG tablet TAKE 1 TABLET(6.25 MG) BY MOUTH TWICE DAILY WITH A MEAL 180 tablet 1  . HUMULIN 70/30 (70-30) 100 UNIT/ML injection ADMINISTER 65 UNITS UNDER THE SKIN DAILY WITH BREAKFAST 20 mL 3  . insulin glargine (LANTUS) 100 UNIT/ML injection ADMINISTER 25 UNITS UNDER THE SKIN AT BEDTIME 30 mL 1  . losartan (COZAAR) 25 MG tablet Take 1 tablet (25 mg total) by mouth daily. 90 tablet 1  . pravastatin (PRAVACHOL) 40 MG tablet TAKE 1 TABLET(40 MG) BY MOUTH DAILY 90 tablet 1  . amoxicillin (AMOXIL) 500 MG capsule TAKE ONE CAPSULE BY MOUTH THREE TIMES DAILY. TAKE ONLY WHEN HAVING DENTAL PROCEDURES (Patient not taking: Reported on 12/10/2019) 30 capsule 2  . furosemide (LASIX) 40 MG tablet Take 1 tablet (40 mg total) by mouth 2 (two) times daily. 180 tablet 3   No facility-administered medications prior to visit.    ROS Review of Systems  Constitutional: Negative for appetite change, chills, diaphoresis, fatigue and unexpected weight change.  HENT: Negative.   Eyes: Negative for visual disturbance.  Respiratory: Positive for shortness of breath. Negative for cough, chest tightness and wheezing.   Cardiovascular: Negative for chest pain,  palpitations and leg swelling.  Gastrointestinal: Negative for abdominal pain, constipation, diarrhea, nausea and vomiting.  Endocrine: Negative.  Negative for polydipsia, polyphagia and polyuria.  Genitourinary: Negative.  Negative for difficulty urinating and dysuria.  Musculoskeletal: Positive for arthralgias. Negative for myalgias.  Skin: Negative.   Neurological: Negative.  Negative for dizziness, weakness and light-headedness.  Hematological: Negative for adenopathy. Does not bruise/bleed easily.   Psychiatric/Behavioral: Negative.     Objective:  BP (!) 130/62 (BP Location: Left Arm, Patient Position: Sitting, Cuff Size: Large)   Pulse 72   Temp 98.1 F (36.7 C) (Oral)   Resp 16   Ht '5\' 8"'  (1.727 m)   Wt (!) 271 lb (122.9 kg)   SpO2 94%   BMI 41.21 kg/m   BP Readings from Last 3 Encounters:  12/10/19 (!) 130/62  05/28/19 140/82  05/21/19 138/76    Wt Readings from Last 3 Encounters:  12/10/19 (!) 271 lb (122.9 kg)  05/28/19 282 lb 9.6 oz (128.2 kg)  05/21/19 287 lb (130.2 kg)    Physical Exam Constitutional:      Appearance: He is obese. He is not ill-appearing.  HENT:     Mouth/Throat:     Mouth: Mucous membranes are moist.  Eyes:     General: No scleral icterus.    Conjunctiva/sclera: Conjunctivae normal.  Cardiovascular:     Rate and Rhythm: Normal rate and regular rhythm.     Heart sounds: Murmur heard.  Systolic murmur is present with a grade of 1/6.  No friction rub. Gallop present. S4 sounds present.   Pulmonary:     Effort: Pulmonary effort is normal.     Breath sounds: No stridor. Examination of the right-lower field reveals rales. Examination of the left-lower field reveals rales. Rales present. No wheezing or rhonchi.  Abdominal:     General: Abdomen is protuberant. Bowel sounds are normal. There is no distension.     Palpations: Abdomen is soft. There is no hepatomegaly, splenomegaly or mass.     Tenderness: There is no abdominal tenderness.  Musculoskeletal:        General: Normal range of motion.     Cervical back: Neck supple.     Right lower leg: Edema (trace) present.     Left lower leg: Edema (trace) present.  Lymphadenopathy:     Cervical: No cervical adenopathy.  Skin:    General: Skin is warm and dry.  Neurological:     General: No focal deficit present.     Mental Status: He is alert.     Lab Results  Component Value Date   WBC 8.1 02/08/2019   HGB 15.9 02/08/2019   HCT 47.7 02/08/2019   PLT 191 02/08/2019   GLUCOSE  188 (H) 12/10/2019   CHOL 136 12/10/2019   TRIG 80 12/10/2019   HDL 49 12/10/2019   LDLCALC 71 12/10/2019   ALT 11 12/10/2019   AST 23 12/10/2019   NA 137 12/10/2019   K 4.0 12/10/2019   CL 101 12/10/2019   CREATININE 2.17 (H) 12/10/2019   BUN 46 (H) 12/10/2019   CO2 26 12/10/2019   TSH 1.45 03/14/2018   PSA 1.77 01/06/2010   HGBA1C 6.4 (H) 12/10/2019   MICROALBUR 17.5 (H) 05/21/2019    No results found.  Assessment & Plan:   Abdimalik was seen today for hypertension, diabetes and hyperlipidemia.  Diagnoses and all orders for this visit:  Stage 3b chronic kidney disease- His renal function is stable.  He agrees to  avoid nephrotoxic agents. -     BASIC METABOLIC PANEL WITH GFR; Future -     BASIC METABOLIC PANEL WITH GFR  Essential hypertension- His blood pressure is adequately well controlled. -     carvedilol (COREG) 6.25 MG tablet; Take 1 tablet (6.25 mg total) by mouth 2 (two) times daily with a meal. -     losartan (COZAAR) 25 MG tablet; Take 1 tablet (25 mg total) by mouth daily. -     BASIC METABOLIC PANEL WITH GFR; Future -     BASIC METABOLIC PANEL WITH GFR  Diabetes mellitus due to underlying condition with diabetic neuropathy, without long-term current use of insulin ([place])- His blood sugars are well controlled with an A1c of 6.4%.  He is aware of hypoglycemic episodes and knows how to avoid them and treat them.  Will continue the current insulin regimen. -     insulin glargine (LANTUS) 100 UNIT/ML injection; ADMINISTER 25 UNITS UNDER THE SKIN AT BEDTIME -     losartan (COZAAR) 25 MG tablet; Take 1 tablet (25 mg total) by mouth daily. -     BASIC METABOLIC PANEL WITH GFR; Future -     Hemoglobin A1c; Future -     Hemoglobin A1c -     BASIC METABOLIC PANEL WITH GFR -     insulin NPH-regular Human (HUMULIN 70/30) (70-30) 100 UNIT/ML injection; Inject 65 Units into the skin daily.  Idiopathic gout of right foot, unspecified chronicity- He has achieved his uric acid  goal and is not having any gout flareups. -     losartan (COZAAR) 25 MG tablet; Take 1 tablet (25 mg total) by mouth daily. -     Uric acid; Future -     Uric acid  Hyperlipidemia with target LDL less than 70- He has achieved his LDL goal and is doing well on the statin. -     pravastatin (PRAVACHOL) 40 MG tablet; Take 1 tablet (40 mg total) by mouth daily. -     Lipid panel; Future -     Hepatic function panel; Future -     Hepatic function panel -     Lipid panel  Routine general medical examination at a health care facility   I have discontinued Mallie Mussel C. Vallely's Aromatic Inhalants (VICKS VAPOINHALER IN). I have also changed his carvedilol, pravastatin, and HumuLIN 70/30. Additionally, I am having him maintain his amoxicillin, furosemide, OneTouch Delica Lancets 38U, blood glucose meter kit and supplies, OneTouch Verio, calcitRIOL, loratadine, B-D ULTRAFINE III SHORT PEN, Eliquis, INSULIN SYRINGE .5CC/31GX5/16", OneTouch Verio, allopurinol, canagliflozin, insulin glargine, and losartan.  Meds ordered this encounter  Medications  . carvedilol (COREG) 6.25 MG tablet    Sig: Take 1 tablet (6.25 mg total) by mouth 2 (two) times daily with a meal.    Dispense:  180 tablet    Refill:  1  . insulin glargine (LANTUS) 100 UNIT/ML injection    Sig: ADMINISTER 25 UNITS UNDER THE SKIN AT BEDTIME    Dispense:  30 mL    Refill:  1  . losartan (COZAAR) 25 MG tablet    Sig: Take 1 tablet (25 mg total) by mouth daily.    Dispense:  90 tablet    Refill:  1  . pravastatin (PRAVACHOL) 40 MG tablet    Sig: Take 1 tablet (40 mg total) by mouth daily.    Dispense:  90 tablet    Refill:  1  . insulin NPH-regular Human (HUMULIN 70/30) (70-30)  100 UNIT/ML injection    Sig: Inject 65 Units into the skin daily.    Dispense:  20 mL    Refill:  3     Follow-up: Return in about 6 months (around 06/11/2020).  Scarlette Calico, MD

## 2019-12-10 NOTE — Patient Instructions (Signed)
Type 2 Diabetes Mellitus, Diagnosis, Adult Type 2 diabetes (type 2 diabetes mellitus) is a long-term (chronic) disease. In type 2 diabetes, one or both of these problems may be present:  The pancreas does not make enough of a hormone called insulin.  Cells in the body do not respond properly to insulin that the body makes (insulin resistance). Normally, insulin allows blood sugar (glucose) to enter cells in the body. The cells use glucose for energy. Insulin resistance or lack of insulin causes excess glucose to build up in the blood instead of going into cells. As a result, high blood glucose (hyperglycemia) develops. What increases the risk? The following factors may make you more likely to develop type 2 diabetes:  Having a family member with type 2 diabetes.  Being overweight or obese.  Having an inactive (sedentary) lifestyle.  Having been diagnosed with insulin resistance.  Having a history of prediabetes, gestational diabetes, or polycystic ovary syndrome (PCOS).  Being of American-Indian, African-American, Hispanic/Latino, or Asian/Pacific Islander descent. What are the signs or symptoms? In the early stage of this condition, you may not have symptoms. Symptoms develop slowly and may include:  Increased thirst (polydipsia).  Increased hunger(polyphagia).  Increased urination (polyuria).  Increased urination during the night (nocturia).  Unexplained weight loss.  Frequent infections that keep coming back (recurring).  Fatigue.  Weakness.  Vision changes, such as blurry vision.  Cuts or bruises that are slow to heal.  Tingling or numbness in the hands or feet.  Dark patches on the skin (acanthosis nigricans). How is this diagnosed? This condition is diagnosed based on your symptoms, your medical history, a physical exam, and your blood glucose level. Your blood glucose may be checked with one or more of the following blood tests:  A fasting blood glucose (FBG)  test. You will not be allowed to eat (you will fast) for 8 hours or longer before a blood sample is taken.  A random blood glucose test. This test checks blood glucose at any time of day regardless of when you ate.  An A1c (hemoglobin A1c) blood test. This test provides information about blood glucose control over the previous 2-3 months.  An oral glucose tolerance test (OGTT). This test measures your blood glucose at two times: ? After fasting. This is your baseline blood glucose level. ? Two hours after drinking a beverage that contains glucose. You may be diagnosed with type 2 diabetes if:  Your FBG level is 126 mg/dL (7.0 mmol/L) or higher.  Your random blood glucose level is 200 mg/dL (11.1 mmol/L) or higher.  Your A1c level is 6.5% or higher.  Your OGTT result is higher than 200 mg/dL (11.1 mmol/L). These blood tests may be repeated to confirm your diagnosis. How is this treated? Your treatment may be managed by a specialist called an endocrinologist. Type 2 diabetes may be treated by following instructions from your health care provider about:  Making diet and lifestyle changes. This may include: ? Following an individualized nutrition plan that is developed by a diet and nutrition specialist (registered dietitian). ? Exercising regularly. ? Finding ways to manage stress.  Checking your blood glucose level as often as told.  Taking diabetes medicines or insulin daily. This helps to keep your blood glucose levels in the healthy range. ? If you use insulin, you may need to adjust the dosage depending on how physically active you are and what foods you eat. Your health care provider will tell you how to adjust your dosage.    Taking medicines to help prevent complications from diabetes, such as: ? Aspirin. ? Medicine to lower cholesterol. ? Medicine to control blood pressure. Your health care provider will set individualized treatment goals for you. Your goals will be based on  your age, other medical conditions you have, and how you respond to diabetes treatment. Generally, the goal of treatment is to maintain the following blood glucose levels:  Before meals (preprandial): 80-130 mg/dL (4.4-7.2 mmol/L).  After meals (postprandial): below 180 mg/dL (10 mmol/L).  A1c level: less than 7%. Follow these instructions at home: Questions to ask your health care provider  Consider asking the following questions: ? Do I need to meet with a diabetes educator? ? Where can I find a support group for people with diabetes? ? What equipment will I need to manage my diabetes at home? ? What diabetes medicines do I need, and when should I take them? ? How often do I need to check my blood glucose? ? What number can I call if I have questions? ? When is my next appointment? General instructions  Take over-the-counter and prescription medicines only as told by your health care provider.  Keep all follow-up visits as told by your health care provider. This is important.  For more information about diabetes, visit: ? American Diabetes Association (ADA): www.diabetes.org ? American Association of Diabetes Educators (AADE): www.diabeteseducator.org Contact a health care provider if:  Your blood glucose is at or above 240 mg/dL (13.3 mmol/L) for 2 days in a row.  You have been sick or have had a fever for 2 days or longer, and you are not getting better.  You have any of the following problems for more than 6 hours: ? You cannot eat or drink. ? You have nausea and vomiting. ? You have diarrhea. Get help right away if:  Your blood glucose is lower than 54 mg/dL (3.0 mmol/L).  You become confused or you have trouble thinking clearly.  You have difficulty breathing.  You have moderate or large ketone levels in your urine. Summary  Type 2 diabetes (type 2 diabetes mellitus) is a long-term (chronic) disease. In type 2 diabetes, the pancreas does not make enough of a  hormone called insulin, or cells in the body do not respond properly to insulin that the body makes (insulin resistance).  This condition is treated by making diet and lifestyle changes and taking diabetes medicines or insulin.  Your health care provider will set individualized treatment goals for you. Your goals will be based on your age, other medical conditions you have, and how you respond to diabetes treatment.  Keep all follow-up visits as told by your health care provider. This is important. This information is not intended to replace advice given to you by your health care provider. Make sure you discuss any questions you have with your health care provider. Document Revised: 06/30/2017 Document Reviewed: 06/05/2015 Elsevier Patient Education  2020 Elsevier Inc.  

## 2019-12-11 LAB — LIPID PANEL
Cholesterol: 136 mg/dL (ref <200)
HDL: 49 mg/dL (ref >=40)
LDL Cholesterol (Calc): 71 mg/dL (calc)
Non-HDL Cholesterol (Calc): 87 mg/dL (calc) (ref <130)
Total CHOL/HDL Ratio: 2.8 (calc) (ref <5.0)
Triglycerides: 80 mg/dL (ref <150)

## 2019-12-11 LAB — HEMOGLOBIN A1C
Hgb A1c MFr Bld: 6.4 % of total Hgb — ABNORMAL HIGH (ref <5.7)
Mean Plasma Glucose: 137 (calc)
eAG (mmol/L): 7.6 (calc)

## 2019-12-11 LAB — HEPATIC FUNCTION PANEL
AG Ratio: 1.3 (calc) (ref 1.0–2.5)
ALT: 11 U/L (ref 9–46)
AST: 23 U/L (ref 10–35)
Albumin: 3.9 g/dL (ref 3.6–5.1)
Alkaline phosphatase (APISO): 95 U/L (ref 35–144)
Bilirubin, Direct: 0.2 mg/dL (ref 0.0–0.2)
Globulin: 3 g/dL (calc) (ref 1.9–3.7)
Indirect Bilirubin: 0.8 mg/dL (calc) (ref 0.2–1.2)
Total Bilirubin: 1 mg/dL (ref 0.2–1.2)
Total Protein: 6.9 g/dL (ref 6.1–8.1)

## 2019-12-11 LAB — BASIC METABOLIC PANEL WITH GFR
BUN/Creatinine Ratio: 21 (calc) (ref 6–22)
BUN: 46 mg/dL — ABNORMAL HIGH (ref 7–25)
CO2: 26 mmol/L (ref 20–32)
Calcium: 9.2 mg/dL (ref 8.6–10.3)
Chloride: 101 mmol/L (ref 98–110)
Creat: 2.17 mg/dL — ABNORMAL HIGH (ref 0.70–1.11)
GFR, Est African American: 32 mL/min/{1.73_m2} — ABNORMAL LOW (ref >=60)
GFR, Est Non African American: 27 mL/min/{1.73_m2} — ABNORMAL LOW (ref >=60)
Glucose, Bld: 188 mg/dL — ABNORMAL HIGH (ref 65–99)
Potassium: 4 mmol/L (ref 3.5–5.3)
Sodium: 137 mmol/L (ref 135–146)

## 2019-12-11 LAB — URIC ACID: Uric Acid, Serum: 6.2 mg/dL (ref 4.0–8.0)

## 2019-12-11 MED ORDER — HUMULIN 70/30 (70-30) 100 UNIT/ML ~~LOC~~ SUSP: 65.0000 [IU] | Freq: Every day | SUBCUTANEOUS | 3 refills | Status: DC

## 2019-12-12 ENCOUNTER — Other Ambulatory Visit: Payer: Self-pay | Admitting: Internal Medicine

## 2019-12-12 DIAGNOSIS — E785 Hyperlipidemia, unspecified: Secondary | ICD-10-CM

## 2019-12-12 DIAGNOSIS — I10 Essential (primary) hypertension: Secondary | ICD-10-CM

## 2019-12-17 ENCOUNTER — Telehealth: Payer: Self-pay

## 2019-12-17 MED ORDER — ONETOUCH VERIO VI STRP: ORAL_STRIP | 1 refills | Status: DC

## 2019-12-17 NOTE — Telephone Encounter (Signed)
Resent rx per pt request.

## 2019-12-17 NOTE — Telephone Encounter (Signed)
New message    1.Medication Requested:ONETOUCH VERIO test strip  2. Pharmacy (Name, Street, City):[facility] [phone] - [place],  - [address] AT [address]  3. On Med List: Yes  4. Last Visit with PCP: 7.27.21   5. Next visit date with PCP:1.25.22   Agent: Please be advised that RX refills may take up to 3 business days. We ask that you follow-up with your pharmacy.

## 2019-12-25 ENCOUNTER — Telehealth: Payer: Self-pay | Admitting: Internal Medicine

## 2019-12-25 MED ORDER — GLUCOSE BLOOD VI STRP: ORAL_STRIP | 3 refills | Status: DC

## 2019-12-25 MED ORDER — BLOOD GLUCOSE MONITOR KIT: PACK | 0 refills | Status: DC

## 2019-12-25 MED ORDER — LANCETS 33G MISC: 3 refills | Status: DC

## 2019-12-25 NOTE — Telephone Encounter (Signed)
Pt stated that the pharmacist asked for new order with note to dispense per insurance. I have resent per pt request. Pt informed that if they are still not able to dispense, pt will need to call the insurance company to find out what is covered.

## 2019-12-25 NOTE — Telephone Encounter (Signed)
   Patient calling to report his insurance no longer covers One Touch test strips. Patient requesting new glucometer, lancets and test strips He did not know what brand insurance covers

## 2020-01-09 ENCOUNTER — Encounter: Payer: Self-pay | Admitting: Internal Medicine

## 2020-01-09 ENCOUNTER — Other Ambulatory Visit: Payer: Self-pay | Admitting: Internal Medicine

## 2020-01-09 DIAGNOSIS — E1122 Type 2 diabetes mellitus with diabetic chronic kidney disease: Secondary | ICD-10-CM | POA: Diagnosis not present

## 2020-01-09 DIAGNOSIS — E1165 Type 2 diabetes mellitus with hyperglycemia: Secondary | ICD-10-CM | POA: Diagnosis not present

## 2020-01-09 DIAGNOSIS — D631 Anemia in chronic kidney disease: Secondary | ICD-10-CM | POA: Diagnosis not present

## 2020-01-09 DIAGNOSIS — N1831 Chronic kidney disease, stage 3a: Secondary | ICD-10-CM | POA: Diagnosis not present

## 2020-01-09 DIAGNOSIS — N2581 Secondary hyperparathyroidism of renal origin: Secondary | ICD-10-CM | POA: Diagnosis not present

## 2020-01-09 DIAGNOSIS — I5033 Acute on chronic diastolic (congestive) heart failure: Secondary | ICD-10-CM | POA: Diagnosis not present

## 2020-01-09 DIAGNOSIS — I10 Essential (primary) hypertension: Secondary | ICD-10-CM | POA: Diagnosis not present

## 2020-01-09 DIAGNOSIS — M109 Gout, unspecified: Secondary | ICD-10-CM | POA: Diagnosis not present

## 2020-01-10 ENCOUNTER — Encounter: Payer: Self-pay | Admitting: Internal Medicine

## 2020-01-11 ENCOUNTER — Other Ambulatory Visit: Payer: Self-pay | Admitting: Internal Medicine

## 2020-01-13 ENCOUNTER — Other Ambulatory Visit: Payer: Self-pay | Admitting: Internal Medicine

## 2020-01-13 DIAGNOSIS — E0821 Diabetes mellitus due to underlying condition with diabetic nephropathy: Secondary | ICD-10-CM

## 2020-01-13 DIAGNOSIS — E0840 Diabetes mellitus due to underlying condition with diabetic neuropathy, unspecified: Secondary | ICD-10-CM

## 2020-01-13 MED ORDER — INSULIN ISOPHANE & REGULAR (HUMAN 70-30)100 UNIT/ML KWIKPEN: 65.0000 [IU] | PEN_INJECTOR | Freq: Two times a day (BID) | SUBCUTANEOUS | 1 refills | Status: DC

## 2020-01-13 MED ORDER — LANTUS SOLOSTAR 100 UNIT/ML ~~LOC~~ SOPN: 25.0000 [IU] | PEN_INJECTOR | Freq: Every day | SUBCUTANEOUS | 1 refills | Status: DC

## 2020-01-13 NOTE — Telephone Encounter (Signed)
Eliquis 2.5mg  refill request received. Patient is 85 years old, weight-122.9kg, Crea-2.17 on 12/10/2019, Diagnosis-Afib, and last seen by Dr. Ladona Ridgel on 05/28/2019. Dose is appropriate based on dosing criteria. Will send in refill to requested pharmacy.

## 2020-01-16 ENCOUNTER — Ambulatory Visit (INDEPENDENT_AMBULATORY_CARE_PROVIDER_SITE_OTHER): Payer: Medicare PPO

## 2020-01-16 ENCOUNTER — Other Ambulatory Visit: Payer: Self-pay

## 2020-01-16 VITALS — BP 130/80 | HR 71 | Temp 98.4°F | Resp 18 | Ht 68.0 in | Wt 270.0 lb

## 2020-01-16 DIAGNOSIS — Z0000 Encounter for general adult medical examination without abnormal findings: Secondary | ICD-10-CM | POA: Diagnosis not present

## 2020-01-16 NOTE — Progress Notes (Signed)
Subjective:   Connor Dixon is a 85 y.o. male who presents for Medicare Annual/Subsequent preventive examination.  Review of Systems    NO ROS. Medicare Wellness Visit. Cardiac Risk Factors include: advanced age (>66mn, >>8women);diabetes mellitus;dyslipidemia;family history of premature cardiovascular disease;hypertension;male gender;obesity (BMI >30kg/m2)     Objective:    Today's Vitals   01/16/20 0814  BP: 130/80  Pulse: 71  Resp: 18  Temp: 98.4 F (36.9 C)  SpO2: 96%  Weight: 270 lb (122.5 kg)  Height: 5' 8" (1.727 m)  PainSc: 0-No pain   Body mass index is 41.05 kg/m.  Advanced Directives 01/16/2020 01/11/2019 01/09/2018 01/28/2016 04/21/2015  Does Patient Have a Medical Advance Directive? _0   Type of Advance Directive Living will;Healthcare Power of [place] -  Does patient want to make changes to medical advance directive? No - Patient declined - - No - Patient declined -  Copy of [place] Chart? No - copy requested No - copy requested No - copy requested Yes -    Current Medications (verified) Outpatient Encounter Medications as of 01/16/2020  Medication Sig  . allopurinol (ZYLOPRIM) 100 MG tablet TAKE 1 TABLET(100 MG) BY MOUTH DAILY  . amoxicillin (AMOXIL) 500 MG capsule TAKE ONE CAPSULE BY MOUTH THREE TIMES DAILY. TAKE ONLY WHEN HAVING DENTAL PROCEDURES  . BD INSULIN SYRINGE U/F 31G X 5/16" 0.5 ML MISC USE AS DIRECTED FOUR TIMES DAILY  . blood glucose meter kit and supplies KIT Use to check blood sugar daily. DX: E11.9  . Blood Glucose Monitoring Suppl (ONETOUCH VERIO) w/Device KIT 1 Device by Does not apply route daily. Use as directed  . calcitRIOL (ROCALTROL) 0.25 MCG capsule Take 0.25 mcg by mouth every other day.   . canagliflozin (INVOKANA) 100 MG TABS tablet Take 1 tablet (100 mg total) by mouth  daily before breakfast.  . carvedilol (COREG) 6.25 MG tablet TAKE 1 TABLET(6.25 MG) BY MOUTH TWICE DAILY WITH A MEAL  . ELIQUIS 2.5 MG TABS tablet TAKE 1 TABLET BY MOUTH TWICE DAILY  . glucose blood test strip Use to check blood sugar daily. DX: E11.9  . insulin glargine (LANTUS SOLOSTAR) 100 UNIT/ML Solostar Pen Inject 25 Units into the skin daily at 10 pm.  . insulin glargine (LANTUS) 100 UNIT/ML injection ADMINISTER 25 UNITS UNDER THE SKIN AT BEDTIME  . insulin isophane & regular human (HUMULIN 70/30 MIX) (70-30) 100 UNIT/ML KwikPen Inject 65 Units into the skin 2 (two) times daily.  . insulin NPH-regular Human (HUMULIN 70/30) (70-30) 100 UNIT/ML injection Inject 65 Units into the skin daily.  . Insulin Pen Needle (B-D ULTRAFINE III SHORT PEN) 31G X 8 MM MISC USE TO ADMINISTER INSULIN THREE TIMES DAILY  . Lancets 33G MISC Use to test blood sugar daily. DX: E11.9  . loratadine (CLARITIN) 10 MG tablet Take 10 mg by mouth daily as needed for allergies.  .Marland Kitchenlosartan (COZAAR) 25 MG tablet Take 1 tablet (25 mg total) by mouth daily.  . pravastatin (PRAVACHOL) 40 MG tablet TAKE 1 TABLET(40 MG) BY MOUTH DAILY  . furosemide (LASIX) 40 MG tablet Take 1 tablet (40 mg total) by mouth 2 (two) times daily.  . [DISCONTINUED] allopurinol (ZYLOPRIM) 100 MG tablet Take 1 tablet (100 mg total) by mouth daily.  . [DISCONTINUED] Aromatic Inhalants (VICKS VAPOINHALER IN) Inhale 1 Dose into the lungs daily as needed (congestion).  . [  DISCONTINUED] blood glucose meter kit and supplies KIT Dispense what is covered by patients insurance.  . [DISCONTINUED] canagliflozin (INVOKANA) 100 MG TABS tablet Take 1 tablet (100 mg total) by mouth daily before breakfast.  . [DISCONTINUED] carvedilol (COREG) 6.25 MG tablet TAKE 1 TABLET(6.25 MG) BY MOUTH TWICE DAILY WITH A MEAL  . [DISCONTINUED] ELIQUIS 2.5 MG TABS tablet TAKE 1 TABLET BY MOUTH TWICE DAILY  . [DISCONTINUED] glucose blood test strip USE TO CHECK BLOOD SUGAR UP TO  FOUR TIMES DAILY  . [DISCONTINUED] insulin glargine (LANTUS) 100 UNIT/ML injection INJECT 25 UNITS INTO THE SKIN AT BEDTIME  . [DISCONTINUED] insulin NPH-regular Human (HUMULIN 70/30) (70-30) 100 UNIT/ML injection ADMINISTER 65 UNITS UNDER THE SKIN DAILY WITH BREAKFAST  . [DISCONTINUED] Insulin Pen Needle (B-D ULTRAFINE III SHORT PEN) 31G X 8 MM MISC USE TO ADMINISTER INSULIN THREE TIMES DAILY  . [DISCONTINUED] Insulin Syringe-Needle U-100 (INSULIN SYRINGE .5CC/31GX5/16") 31G X 5/16" 0.5 ML MISC USE AS DIRECTED FOUR TIMES DAILY  . [DISCONTINUED] losartan (COZAAR) 25 MG tablet Take 1 tablet (25 mg total) by mouth daily.  . [DISCONTINUED] ONETOUCH DELICA LANCETS 35W MISC TEST THREE TIMES DAILY  . [DISCONTINUED] pravastatin (PRAVACHOL) 40 MG tablet TAKE 1 TABLET(40 MG) BY MOUTH DAILY   No facility-administered encounter medications on file as of 01/16/2020.    Allergies (verified) Oysters [shellfish allergy] and Apple   History: Past Medical History:  Diagnosis Date  . Acute on chronic diastolic congestive heart failure ([place])   . Arthus phenomenon   . Atrioventricular block, complete ([place])   . Cardiac pacemaker in situ 07/22/2010  . Chronic kidney disease, stage 3    Presumably from longstanding DM & HTN. Scr has gradually increased over the past 2 years (1.6 in 12/2010, 2 in 03/2011, and up to 2.1 in 01/2013)  . DIABETES MELLITUS, TYPE I, ADULT ONSET 08/06/2007  . DM (diabetes mellitus) ([place])    adult onset   . Essential hypertension, benign 08/06/2007   Very good BP control  . History of second degree heart block   . HLD (hyperlipidemia)   . HYPERLIPIDEMIA 08/06/2007  . Hypopotassemia   . Morbid obesity ([place])   . Obesity   . Other and unspecified angina pectoris    typical  . Type I (juvenile type) diabetes mellitus without mention of complication, not stated as uncontrolled    (04/25/13 OV with Dr. Marval Regal) = Poorly controlled & is working  w/Dr. Crissie Sickles to improve this. Most recent  Hgb A1c was 8.2% but Mr. Bixler reports it was down to 7.6 last month.   Past Surgical History:  Procedure Laterality Date  . CATARACT EXTRACTION  8/08   [place]  . PPM GENERATOR CHANGEOUT N/A 02/13/2019   Procedure: PPM GENERATOR CHANGEOUT;  Surgeon: Evans Lance, MD;  Location: [facility];  Service: Cardiovascular;  Laterality: N/A;  . PTVDP  12/11   PPM - [employer]  . TOTAL HIP ARTHROPLASTY  04/05/06   right   Family History  Problem Relation Age of Onset  . Asthma Father   . Coronary artery disease Father   . Diabetes Father   . Hyperlipidemia Father   . Stroke Father 79  . Coronary artery disease Mother   . Asthma Mother   . Heart attack Mother 28  . Colon cancer Neg Hx        no definate prostate CA    Social History   Socioeconomic History  . Marital status: Widowed    Spouse  name: Not on file  . Number of children: 2  . Years of education: Not on file  . Highest education level: Not on file  Occupational History  . Occupation: retired  Tobacco Use  . Smoking status: Never Smoker  . Smokeless tobacco: Never Used  Vaping Use  . Vaping Use: Never used  Substance and Sexual Activity  . Alcohol use: No  . Drug use: No  . Sexual activity: Not Currently  Other Topics Concern  . Not on file  Social History Narrative   [employer] BA, 2 Master's degrees. [employer] - for certification Education specialist.    Retired Tree surgeon.   Active - travels.   Married - 1961; 2 sons ([place]) and 5 grandchildren.        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    Social Determinants of Health   Financial Resource Strain: Low Risk   . Difficulty of Paying Living Expenses: Not hard at all  Food Insecurity: No Food Insecurity  . Worried About Charity fundraiser in the Last Year: Never true  . Ran Out of Food in the Last Year: Never true  Transportation Needs: No Transportation Needs  . Lack of Transportation (Medical): No  . Lack of  Transportation (Non-Medical): No  Physical Activity: Insufficiently Active  . Days of Exercise per Week: 5 days  . Minutes of Exercise per Session: 20 min  Stress: No Stress Concern Present  . Feeling of Stress : Not at all  Social Connections: Socially Isolated  . Frequency of Communication with Friends and Family: More than three times a week  . Frequency of Social Gatherings with Friends and Family: Never  . Attends Religious Services: Never  . Active Member of Clubs or Organizations: No  . Attends Archivist Meetings: Never  . Marital Status: Widowed    Tobacco Counseling Counseling given: Not Answered   Clinical Intake:  Pre-visit preparation completed: Yes  Pain : No/denies pain Pain Score: 0-No pain     BMI - recorded: 41.05 Nutritional Status: BMI > 30  Obese Nutritional Risks: None Diabetes: Yes CBG done?: No CBG resulted in Enter/ Edit results?: No Did pt. bring in CBG monitor from home?: No  How often do you need to have someone help you when you read instructions, pamphlets, or other written materials from your doctor or pharmacy?: 1 - Never What is the last grade level you completed in school?: EDS  Diabetic? yes  Interpreter Needed?: No  Information entered by :: Shenika N. Hatfield, LPN   Activities of Daily Living In your present state of health, do you have any difficulty performing the following activities: 01/16/2020  Hearing? N  Vision? N  Difficulty concentrating or making decisions? N  Walking or climbing stairs? N  Dressing or bathing? N  Doing errands, shopping? N  Preparing Food and eating ? N  Using the Toilet? N  In the past six months, have you accidently leaked urine? N  Do you have problems with loss of bowel control? N  Managing your Medications? N  Managing your Finances? N  Housekeeping or managing your Housekeeping? N  Some recent data might be hidden    Patient Care Team: Janith Lima, MD as PCP - General  (Internal Medicine) Evans Lance, MD (Cardiology) Elsie Saas, MD (Orthopedic Surgery) Shon Hough, MD (Ophthalmology)  Indicate any recent Medical Services you may have received from other than [facility] providers in the past year (  date may be approximate).     Assessment:   This is a routine wellness examination for [place].  Hearing/Vision screen No exam data present  Dietary issues and exercise activities discussed: Current Exercise Habits: Home exercise routine, Type of exercise: walking;Other - see comments (yard work & gardening), Time (Minutes): 25, Frequency (Times/Week): 5, Weekly Exercise (Minutes/Week): 125, Intensity: Mild, Exercise limited by: None identified  Goals    .  Patient Stated      Increase the amount of time that I work out in my yard and flower garden. Continue to love my family and enjoy life.    .  Patient Stated (pt-stated)      My goal is to lose more pounds.      Depression Screen PHQ 2/9 Scores 01/16/2020 01/11/2019 11/26/2018 01/09/2018 09/07/2017 09/07/2017 01/28/2016  PHQ - 2 Score 0 0 0 1 0 0 0  PHQ- 9 Score - - - 1 0 0 -    Fall Risk Fall Risk  01/16/2020 05/21/2019 01/11/2019 11/26/2018 01/09/2018  Falls in the past year? 0 0 1 1 No  Number falls in past yr: 0 0 1 1 -  Injury with Fall? 0 0 0 0 -  Risk for fall due to : No Fall Risks Impaired balance/gait Impaired balance/gait;Impaired mobility Impaired balance/gait;Impaired mobility Impaired balance/gait;Impaired mobility  Follow up Falls evaluation completed Falls evaluation completed Falls prevention discussed Falls evaluation completed;Education provided -    Any stairs in or around the home? Yes  If so, are there any without handrails? No  Home free of loose throw rugs in walkways, pet beds, electrical cords, etc? Yes  Adequate lighting in your home to reduce risk of falls? Yes   ASSISTIVE DEVICES UTILIZED TO PREVENT FALLS:  Life alert? No  Use of a cane, walker or w/c? No  Grab bars  in the bathroom? No  Shower chair or bench in shower? No  Elevated toilet seat or a handicapped toilet? No   TIMED UP AND GO:  Was the test performed? No .  Length of time to ambulate 10 feet: 0 sec.   Gait slow and steady without use of assistive device  Cognitive Function: MMSE - Mini Mental State Exam 01/09/2018  Orientation to time 5  Orientation to Place 5  Registration 3  Attention/ Calculation 5  Recall 2  Language- name 2 objects 2  Language- repeat 1  Language- follow 3 step command 3  Language- read & follow direction 1  Write a sentence 1  Copy design 1  Total score 29     6CIT Screen 01/16/2020  What Year? 0 points  What month? 0 points  What time? 0 points  Count back from 20 0 points  Months in reverse 0 points  Repeat phrase 0 points  Total Score 0    Immunizations Immunization History  Administered Date(s) Administered  . Fluad Quad(high Dose 65+) 01/11/2019  . H1N1 06/05/2008  . Influenza Split 02/22/2011  . Influenza Whole 02/04/2008, 02/24/2009, 02/05/2010  . Influenza, High Dose Seasonal PF 02/20/2015, 01/27/2016, 01/31/2017, 02/07/2018  . Influenza,inj,Quad PF,6+ Mos 01/15/2013  . Moderna SARS-COVID-2 Vaccination 05/30/2019, 06/27/2019  . Pneumococcal Conjugate-13 04/02/2013  . Pneumococcal Polysaccharide-23 01/09/2012  . Td 01/09/2012  . Zoster 02/28/2014  . Zoster Recombinat (Shingrix) 08/16/2017, 10/17/2017    TDAP status: Up to date Flu Vaccine status: Up to date Pneumococcal vaccine status: Up to date Covid-19 vaccine status: Completed vaccines  Qualifies for Shingles Vaccine? Yes   Zostavax  completed Yes   Shingrix Completed?: Yes  Screening Tests Health Maintenance  Topic Date Due  . INFLUENZA VACCINE  12/15/2019  . FOOT EXAM  05/20/2020  . OPHTHALMOLOGY EXAM  09/25/2020  . TETANUS/TDAP  01/08/2022  . COVID-19 Vaccine  Completed  . PNA vac Low Risk Adult  Completed    Health Maintenance  Health Maintenance Due    Topic Date Due  . INFLUENZA VACCINE  12/15/2019    Colorectal cancer screening: No longer required.   Lung Cancer Screening: (Low Dose CT Chest recommended if Age 66-80 years, 30 pack-year currently smoking OR have quit w/in 15years.) does not qualify.   Lung Cancer Screening Referral: no  Additional Screening:  Hepatitis C Screening: does not qualify; Completed no  Vision Screening: Recommended annual ophthalmology exams for early detection of glaucoma and other disorders of the eye. Is the patient up to date with their annual eye exam?  Yes  Who is the provider or what is the name of the office in which the patient attends annual eye exams? Shon Hough, MD If pt is not established with a provider, would they like to be referred to a provider to establish care? No .   Dental Screening: Recommended annual dental exams for proper oral hygiene  Community Resource Referral / Chronic Care Management: CRR required this visit?  No   CCM required this visit?  No      Plan:     I have personally reviewed and noted the following in the patient's chart:   . Medical and social history . Use of alcohol, tobacco or illicit drugs  . Current medications and supplements . Functional ability and status . Nutritional status . Physical activity . Advanced directives . List of other physicians . Hospitalizations, surgeries, and ER visits in previous 12 months . Vitals . Screenings to include cognitive, depression, and falls . Referrals and appointments  In addition, I have reviewed and discussed with patient certain preventive protocols, quality metrics, and best practice recommendations. A written personalized care plan for preventive services as well as general preventive health recommendations were provided to patient.     Sheral Flow, LPN   5/0/0370   Nurse Notes: n/a

## 2020-01-16 NOTE — Patient Instructions (Signed)
Connor Dixon , Thank you for taking time to come for your Medicare Wellness Visit. I appreciate your ongoing commitment to your health goals. Please review the following plan we discussed and let me know if I can assist you in the future.   Screening recommendations/referrals: Colonoscopy: last done 12/08/2009; no repeat due to age Recommended yearly ophthalmology/optometry visit for glaucoma screening and checkup Recommended yearly dental visit for hygiene and checkup  Vaccinations: Influenza vaccine: 01/11/2019 Pneumococcal vaccine: completed Tdap vaccine: 01/09/2012; due every 10 years Shingles vaccine: completed   Covid-19: completed  Advanced directives: Please bring a copy of your health care power of attorney and living will to the office at your convenience.  Conditions/risks identified: Yes. Reviewed health maintenance screenings with patient today and relevant education, vaccines, and/or referrals were provided. Continue doing brain stimulating activities (puzzles, reading, adult coloring books, staying active) to keep memory sharp. Continue to eat heart healthy diet (full of fruits, vegetables, whole grains, lean protein, water--limit salt, fat, and sugar intake) and increase physical activity as tolerated.  Next appointment: Please schedule your next Medicare Wellness Visit with your Nurse Health Advisor in 1 year.  Preventive Care 83 Years and Older, Male Preventive care refers to lifestyle choices and visits with your health care provider that can promote health and wellness. What does preventive care include?  A yearly physical exam. This is also called an annual well check.  Dental exams once or twice a year.  Routine eye exams. Ask your health care provider how often you should have your eyes checked.  Personal lifestyle choices, including:  Daily care of your teeth and gums.  Regular physical activity.  Eating a healthy diet.  Avoiding tobacco and drug  use.  Limiting alcohol use.  Practicing safe sex.  Taking low doses of aspirin every day.  Taking vitamin and mineral supplements as recommended by your health care provider. What happens during an annual well check? The services and screenings done by your health care provider during your annual well check will depend on your age, overall health, lifestyle risk factors, and family history of disease. Counseling  Your health care provider may ask you questions about your:  Alcohol use.  Tobacco use.  Drug use.  Emotional well-being.  Home and relationship well-being.  Sexual activity.  Eating habits.  History of falls.  Memory and ability to understand (cognition).  Work and work Astronomer. Screening  You may have the following tests or measurements:  Height, weight, and BMI.  Blood pressure.  Lipid and cholesterol levels. These may be checked every 5 years, or more frequently if you are over 83 years old.  Skin check.  Lung cancer screening. You may have this screening every year starting at age 83 if you have a 30-pack-year history of smoking and currently smoke or have quit within the past 15 years.  Fecal occult blood test (FOBT) of the stool. You may have this test every year starting at age 83.  Flexible sigmoidoscopy or colonoscopy. You may have a sigmoidoscopy every 5 years or a colonoscopy every 10 years starting at age 83.  Prostate cancer screening. Recommendations will vary depending on your family history and other risks.  Hepatitis C blood test.  Hepatitis B blood test.  Sexually transmitted disease (STD) testing.  Diabetes screening. This is done by checking your blood sugar (glucose) after you have not eaten for a while (fasting). You may have this done every 1-3 years.  Abdominal aortic aneurysm (AAA) screening. You  may need this if you are a current or former smoker.  Osteoporosis. You may be screened starting at age 83 if you are at  high risk. Talk with your health care provider about your test results, treatment options, and if necessary, the need for more tests. Vaccines  Your health care provider may recommend certain vaccines, such as:  Influenza vaccine. This is recommended every year.  Tetanus, diphtheria, and acellular pertussis (Tdap, Td) vaccine. You may need a Td booster every 10 years.  Zoster vaccine. You may need this after age 83.  Pneumococcal 13-valent conjugate (PCV13) vaccine. One dose is recommended after age 83.  Pneumococcal polysaccharide (PPSV23) vaccine. One dose is recommended after age 83. Talk to your health care provider about which screenings and vaccines you need and how often you need them. This information is not intended to replace advice given to you by your health care provider. Make sure you discuss any questions you have with your health care provider. Document Released: 05/29/2015 Document Revised: 01/20/2016 Document Reviewed: 03/03/2015 Elsevier Interactive Patient Education  2017 [place] Prevention in the Home Falls can cause injuries. They can happen to people of all ages. There are many things you can do to make your home safe and to help prevent falls. What can I do on the outside of my home?  Regularly fix the edges of walkways and driveways and fix any cracks.  Remove anything that might make you trip as you walk through a door, such as a raised step or threshold.  Trim any bushes or trees on the path to your home.  Use bright outdoor lighting.  Clear any walking paths of anything that might make someone trip, such as rocks or tools.  Regularly check to see if handrails are loose or broken. Make sure that both sides of any steps have handrails.  Any raised decks and porches should have guardrails on the edges.  Have any leaves, snow, or ice cleared regularly.  Use sand or salt on walking paths during winter.  Clean up any spills in your garage  right away. This includes oil or grease spills. What can I do in the bathroom?  Use night lights.  Install grab bars by the toilet and in the tub and shower. Do not use towel bars as grab bars.  Use non-skid mats or decals in the tub or shower.  If you need to sit down in the shower, use a plastic, non-slip stool.  Keep the floor dry. Clean up any water that spills on the floor as soon as it happens.  Remove soap buildup in the tub or shower regularly.  Attach bath mats securely with double-sided non-slip rug tape.  Do not have throw rugs and other things on the floor that can make you trip. What can I do in the bedroom?  Use night lights.  Make sure that you have a light by your bed that is easy to reach.  Do not use any sheets or blankets that are too big for your bed. They should not hang down onto the floor.  Have a firm chair that has side arms. You can use this for support while you get dressed.  Do not have throw rugs and other things on the floor that can make you trip. What can I do in the kitchen?  Clean up any spills right away.  Avoid walking on wet floors.  Keep items that you use a lot in easy-to-reach places.  If you need to reach something above you, use a strong step stool that has a grab bar.  Keep electrical cords out of the way.  Do not use floor polish or wax that makes floors slippery. If you must use wax, use non-skid floor wax.  Do not have throw rugs and other things on the floor that can make you trip. What can I do with my stairs?  Do not leave any items on the stairs.  Make sure that there are handrails on both sides of the stairs and use them. Fix handrails that are broken or loose. Make sure that handrails are as long as the stairways.  Check any carpeting to make sure that it is firmly attached to the stairs. Fix any carpet that is loose or worn.  Avoid having throw rugs at the top or bottom of the stairs. If you do have throw rugs,  attach them to the floor with carpet tape.  Make sure that you have a light switch at the top of the stairs and the bottom of the stairs. If you do not have them, ask someone to add them for you. What else can I do to help prevent falls?  Wear shoes that:  Do not have high heels.  Have rubber bottoms.  Are comfortable and fit you well.  Are closed at the toe. Do not wear sandals.  If you use a stepladder:  Make sure that it is fully opened. Do not climb a closed stepladder.  Make sure that both sides of the stepladder are locked into place.  Ask someone to hold it for you, if possible.  Clearly mark and make sure that you can see:  Any grab bars or handrails.  First and last steps.  Where the edge of each step is.  Use tools that help you move around (mobility aids) if they are needed. These include:  Canes.  Walkers.  Scooters.  Crutches.  Turn on the lights when you go into a dark area. Replace any light bulbs as soon as they burn out.  Set up your furniture so you have a clear path. Avoid moving your furniture around.  If any of your floors are uneven, fix them.  If there are any pets around you, be aware of where they are.  Review your medicines with your doctor. Some medicines can make you feel dizzy. This can increase your chance of falling. Ask your doctor what other things that you can do to help prevent falls. This information is not intended to replace advice given to you by your health care provider. Make sure you discuss any questions you have with your health care provider. Document Released: 02/26/2009 Document Revised: 10/08/2015 Document Reviewed: 06/06/2014 Elsevier Interactive Patient Education  2017 [employer].

## 2020-01-22 DIAGNOSIS — H3122 Choroidal dystrophy (central areolar) (generalized) (peripapillary): Secondary | ICD-10-CM | POA: Diagnosis not present

## 2020-01-22 DIAGNOSIS — H35373 Puckering of macula, bilateral: Secondary | ICD-10-CM | POA: Diagnosis not present

## 2020-01-22 DIAGNOSIS — E113293 Type 2 diabetes mellitus with mild nonproliferative diabetic retinopathy without macular edema, bilateral: Secondary | ICD-10-CM | POA: Diagnosis not present

## 2020-01-22 DIAGNOSIS — Z961 Presence of intraocular lens: Secondary | ICD-10-CM | POA: Diagnosis not present

## 2020-01-29 ENCOUNTER — Telehealth: Payer: Self-pay | Admitting: Internal Medicine

## 2020-01-29 MED ORDER — FUROSEMIDE 40 MG PO TABS: 40.0000 mg | ORAL_TABLET | Freq: Two times a day (BID) | ORAL | 3 refills | Status: DC

## 2020-01-29 MED ORDER — FUROSEMIDE 40 MG PO TABS
ORAL_TABLET | ORAL | 3 refills | Status: DC
Start: 2020-01-29 — End: 2023-11-07

## 2020-01-29 NOTE — Telephone Encounter (Signed)
Pt seen by Dr. Ladona Ridgel 05/2019.  Will refill furosemide.

## 2020-01-29 NOTE — Telephone Encounter (Signed)
New Message:     Pt says he is not getting enough of Furosemide pills, following the directions on how he is supposed to take them. He wants you to do this asap please, he says he is out of his medicine.

## 2020-01-29 NOTE — Addendum Note (Signed)
Addended by: Roney Mans A on: 01/29/2020 12:14 PM   Modules accepted: Orders

## 2020-01-29 NOTE — Telephone Encounter (Signed)
Per Pt-takes furosemide 40 mg 2 tablets in AM and 1 tablet in PM.  Per Dr. Wendee Copp is ok.  Corrected prescription and sent to pharmacy.

## 2020-01-29 NOTE — Telephone Encounter (Signed)
Pt is requesting a refill on furosemide. This medication has not been refilled since 2018. Would Dr. Ladona Ridgel like to refill this medication? Please address

## 2020-02-03 DIAGNOSIS — N189 Chronic kidney disease, unspecified: Secondary | ICD-10-CM | POA: Diagnosis not present

## 2020-02-03 DIAGNOSIS — N1831 Chronic kidney disease, stage 3a: Secondary | ICD-10-CM | POA: Diagnosis not present

## 2020-02-06 ENCOUNTER — Other Ambulatory Visit: Payer: Self-pay | Admitting: Internal Medicine

## 2020-02-06 MED ORDER — GLUCOSE BLOOD VI STRP: ORAL_STRIP | 2 refills | Status: DC

## 2020-02-06 NOTE — Telephone Encounter (Signed)
° °  Patient reports he checks his blood sugar 3 times a day. He is out of test strips. Requesting new order be written for larger quantity and sent to pharmacy on file

## 2020-02-10 ENCOUNTER — Other Ambulatory Visit: Payer: Self-pay | Admitting: Internal Medicine

## 2020-02-10 DIAGNOSIS — Z794 Long term (current) use of insulin: Secondary | ICD-10-CM

## 2020-02-10 DIAGNOSIS — E0840 Diabetes mellitus due to underlying condition with diabetic neuropathy, unspecified: Secondary | ICD-10-CM

## 2020-02-10 MED ORDER — GLUCOSE BLOOD VI STRP: ORAL_STRIP | 2 refills | Status: DC

## 2020-02-10 NOTE — Telephone Encounter (Signed)
I have spoke to the pt. He stated that he checks his blood sugar 3-4 times a day. I see in his chart the Rx says to check daily. He mentioned that he has been checking his sugar 3-4 times a day for years since he is on 2 different types of insulin. He would like for the rx to be corrected. He stated that pharmacy received the script that was sent in for a higher quantity last week but it still doesn't help due to the frequency that he test a day. Please advise.

## 2020-02-10 NOTE — Telephone Encounter (Signed)
   Patient states he has no test strips remaining. He states he is testing blood sugar 4 times a day. He wants to know if any samples are available today, until Dr Yetta Barre can write a new order for larger quantity of strips   Patient requesting a call back today

## 2020-02-11 ENCOUNTER — Encounter: Payer: Self-pay | Admitting: Internal Medicine

## 2020-02-18 ENCOUNTER — Ambulatory Visit (INDEPENDENT_AMBULATORY_CARE_PROVIDER_SITE_OTHER): Payer: Medicare PPO

## 2020-02-18 ENCOUNTER — Other Ambulatory Visit: Payer: Self-pay

## 2020-02-18 DIAGNOSIS — Z23 Encounter for immunization: Secondary | ICD-10-CM | POA: Diagnosis not present

## 2020-02-19 ENCOUNTER — Ambulatory Visit: Payer: Medicare PPO

## 2020-02-21 ENCOUNTER — Ambulatory Visit (INDEPENDENT_AMBULATORY_CARE_PROVIDER_SITE_OTHER): Payer: Medicare PPO

## 2020-02-21 DIAGNOSIS — I442 Atrioventricular block, complete: Secondary | ICD-10-CM

## 2020-02-21 LAB — CUP PACEART REMOTE DEVICE CHECK
Battery Remaining Longevity: 104 mo
Battery Remaining Percentage: 95.5 %
Battery Voltage: 3.01 V
Brady Statistic AP VP Percent: 2 %
Brady Statistic AP VS Percent: 1 %
Brady Statistic AS VP Percent: 97 %
Brady Statistic AS VS Percent: 1 %
Brady Statistic RA Percent Paced: 1 %
Brady Statistic RV Percent Paced: 99 %
Date Time Interrogation Session: 20211008044140
Implantable Lead Implant Date: 20111202
Implantable Lead Implant Date: 20111202
Implantable Lead Location: 753859
Implantable Lead Location: 753860
Implantable Pulse Generator Implant Date: 20200930
Lead Channel Impedance Value: 400 Ohm
Lead Channel Impedance Value: 460 Ohm
Lead Channel Pacing Threshold Amplitude: 0.75 V
Lead Channel Pacing Threshold Amplitude: 1.75 V
Lead Channel Pacing Threshold Pulse Width: 0.4 ms
Lead Channel Pacing Threshold Pulse Width: 0.8 ms
Lead Channel Sensing Intrinsic Amplitude: 1.9 mV
Lead Channel Sensing Intrinsic Amplitude: 11 mV
Lead Channel Setting Pacing Amplitude: 2 V
Lead Channel Setting Pacing Amplitude: 2 V
Lead Channel Setting Pacing Pulse Width: 0.8 ms
Lead Channel Setting Sensing Sensitivity: 4 mV
Pulse Gen Model: 2272
Pulse Gen Serial Number: 9156495

## 2020-02-24 NOTE — Progress Notes (Signed)
Remote pacemaker transmission.   

## 2020-03-11 ENCOUNTER — Other Ambulatory Visit: Payer: Self-pay | Admitting: Internal Medicine

## 2020-03-11 DIAGNOSIS — E0840 Diabetes mellitus due to underlying condition with diabetic neuropathy, unspecified: Secondary | ICD-10-CM

## 2020-04-21 DIAGNOSIS — H3582 Retinal ischemia: Secondary | ICD-10-CM | POA: Diagnosis not present

## 2020-04-21 DIAGNOSIS — H35373 Puckering of macula, bilateral: Secondary | ICD-10-CM | POA: Diagnosis not present

## 2020-04-21 DIAGNOSIS — E113292 Type 2 diabetes mellitus with mild nonproliferative diabetic retinopathy without macular edema, left eye: Secondary | ICD-10-CM | POA: Diagnosis not present

## 2020-04-21 DIAGNOSIS — H43393 Other vitreous opacities, bilateral: Secondary | ICD-10-CM | POA: Diagnosis not present

## 2020-04-21 DIAGNOSIS — Z961 Presence of intraocular lens: Secondary | ICD-10-CM | POA: Diagnosis not present

## 2020-04-21 DIAGNOSIS — E113211 Type 2 diabetes mellitus with mild nonproliferative diabetic retinopathy with macular edema, right eye: Secondary | ICD-10-CM | POA: Diagnosis not present

## 2020-05-10 ENCOUNTER — Other Ambulatory Visit: Payer: Self-pay | Admitting: Internal Medicine

## 2020-05-10 DIAGNOSIS — E0840 Diabetes mellitus due to underlying condition with diabetic neuropathy, unspecified: Secondary | ICD-10-CM

## 2020-05-10 DIAGNOSIS — N183 Chronic kidney disease, stage 3 unspecified: Secondary | ICD-10-CM

## 2020-05-10 DIAGNOSIS — M1A3790 Chronic gout due to renal impairment, unspecified ankle and foot, without tophus (tophi): Secondary | ICD-10-CM

## 2020-05-10 DIAGNOSIS — M10071 Idiopathic gout, right ankle and foot: Secondary | ICD-10-CM

## 2020-05-22 ENCOUNTER — Ambulatory Visit (INDEPENDENT_AMBULATORY_CARE_PROVIDER_SITE_OTHER): Payer: Medicare PPO

## 2020-05-22 DIAGNOSIS — I442 Atrioventricular block, complete: Secondary | ICD-10-CM

## 2020-05-22 LAB — CUP PACEART REMOTE DEVICE CHECK
Battery Remaining Longevity: 100 mo
Battery Remaining Percentage: 95.5 %
Battery Voltage: 2.99 V
Brady Statistic AP VP Percent: 1.8 %
Brady Statistic AP VS Percent: 1 %
Brady Statistic AS VP Percent: 97 %
Brady Statistic AS VS Percent: 1 %
Brady Statistic RA Percent Paced: 1 %
Brady Statistic RV Percent Paced: 99 %
Date Time Interrogation Session: 20220107062401
Implantable Lead Implant Date: 20111202
Implantable Lead Implant Date: 20111202
Implantable Lead Location: 753859
Implantable Lead Location: 753860
Implantable Pulse Generator Implant Date: 20200930
Lead Channel Impedance Value: 400 Ohm
Lead Channel Impedance Value: 440 Ohm
Lead Channel Pacing Threshold Amplitude: 0.75 V
Lead Channel Pacing Threshold Amplitude: 1.875 V
Lead Channel Pacing Threshold Pulse Width: 0.4 ms
Lead Channel Pacing Threshold Pulse Width: 0.8 ms
Lead Channel Sensing Intrinsic Amplitude: 1.7 mV
Lead Channel Sensing Intrinsic Amplitude: 8.7 mV
Lead Channel Setting Pacing Amplitude: 2 V
Lead Channel Setting Pacing Amplitude: 2.125
Lead Channel Setting Pacing Pulse Width: 0.8 ms
Lead Channel Setting Sensing Sensitivity: 4 mV
Pulse Gen Model: 2272
Pulse Gen Serial Number: 9156495

## 2020-06-02 DIAGNOSIS — N1831 Chronic kidney disease, stage 3a: Secondary | ICD-10-CM | POA: Diagnosis not present

## 2020-06-03 ENCOUNTER — Other Ambulatory Visit: Payer: Self-pay | Admitting: Internal Medicine

## 2020-06-03 DIAGNOSIS — M10071 Idiopathic gout, right ankle and foot: Secondary | ICD-10-CM

## 2020-06-03 DIAGNOSIS — I10 Essential (primary) hypertension: Secondary | ICD-10-CM

## 2020-06-03 DIAGNOSIS — E0840 Diabetes mellitus due to underlying condition with diabetic neuropathy, unspecified: Secondary | ICD-10-CM

## 2020-06-04 ENCOUNTER — Other Ambulatory Visit: Payer: Self-pay | Admitting: Internal Medicine

## 2020-06-04 NOTE — Progress Notes (Signed)
Remote pacemaker transmission.   

## 2020-06-09 ENCOUNTER — Other Ambulatory Visit: Payer: Self-pay

## 2020-06-09 ENCOUNTER — Ambulatory Visit (INDEPENDENT_AMBULATORY_CARE_PROVIDER_SITE_OTHER): Payer: Medicare PPO | Admitting: Internal Medicine

## 2020-06-09 ENCOUNTER — Encounter: Payer: Self-pay | Admitting: Internal Medicine

## 2020-06-09 VITALS — BP 126/86 | HR 78 | Temp 98.3°F | Ht 68.0 in | Wt 250.0 lb

## 2020-06-09 DIAGNOSIS — I10 Essential (primary) hypertension: Secondary | ICD-10-CM

## 2020-06-09 DIAGNOSIS — Z23 Encounter for immunization: Secondary | ICD-10-CM | POA: Diagnosis not present

## 2020-06-09 DIAGNOSIS — E0840 Diabetes mellitus due to underlying condition with diabetic neuropathy, unspecified: Secondary | ICD-10-CM

## 2020-06-09 DIAGNOSIS — Z0000 Encounter for general adult medical examination without abnormal findings: Secondary | ICD-10-CM

## 2020-06-09 DIAGNOSIS — N1832 Chronic kidney disease, stage 3b: Secondary | ICD-10-CM | POA: Diagnosis not present

## 2020-06-09 DIAGNOSIS — N183 Chronic kidney disease, stage 3 unspecified: Secondary | ICD-10-CM | POA: Diagnosis not present

## 2020-06-09 DIAGNOSIS — Z0001 Encounter for general adult medical examination with abnormal findings: Secondary | ICD-10-CM

## 2020-06-09 LAB — CBC WITH DIFFERENTIAL/PLATELET
Basophils Absolute: 0 10*3/uL (ref 0.0–0.1)
Basophils Relative: 0.4 % (ref 0.0–3.0)
Eosinophils Absolute: 0.3 10*3/uL (ref 0.0–0.7)
Eosinophils Relative: 4.5 % (ref 0.0–5.0)
HCT: 46.6 % (ref 39.0–52.0)
Hemoglobin: 15.5 g/dL (ref 13.0–17.0)
Lymphocytes Relative: 24.2 % (ref 12.0–46.0)
Lymphs Abs: 1.7 10*3/uL (ref 0.7–4.0)
MCHC: 33.4 g/dL (ref 30.0–36.0)
MCV: 93.8 fl (ref 78.0–100.0)
Monocytes Absolute: 0.9 10*3/uL (ref 0.1–1.0)
Monocytes Relative: 12.3 % — ABNORMAL HIGH (ref 3.0–12.0)
Neutro Abs: 4.1 10*3/uL (ref 1.4–7.7)
Neutrophils Relative %: 58.6 % (ref 43.0–77.0)
Platelets: 210 10*3/uL (ref 150.0–400.0)
RBC: 4.97 Mil/uL (ref 4.22–5.81)
RDW: 14.9 % (ref 11.5–15.5)
WBC: 7 10*3/uL (ref 4.0–10.5)

## 2020-06-09 LAB — URINALYSIS, ROUTINE W REFLEX MICROSCOPIC
Bilirubin Urine: NEGATIVE
Ketones, ur: NEGATIVE
Leukocytes,Ua: NEGATIVE
Nitrite: NEGATIVE
RBC / HPF: NONE SEEN (ref 0–?)
Specific Gravity, Urine: 1.015 (ref 1.000–1.030)
Total Protein, Urine: 30 — AB
Urine Glucose: >=1000 — AB
Urobilinogen, UA: 0.2 (ref 0.0–1.0)
pH: 5.5 (ref 5.0–8.0)

## 2020-06-09 LAB — BASIC METABOLIC PANEL
BUN: 40 mg/dL — ABNORMAL HIGH (ref 6–23)
CO2: 31 mEq/L (ref 19–32)
Calcium: 9.7 mg/dL (ref 8.4–10.5)
Chloride: 100 mEq/L (ref 96–112)
Creatinine, Ser: 1.95 mg/dL — ABNORMAL HIGH (ref 0.40–1.50)
GFR: 31.26 mL/min — ABNORMAL LOW (ref >60.00)
Glucose, Bld: 129 mg/dL — ABNORMAL HIGH (ref 70–99)
Potassium: 3.5 mEq/L (ref 3.5–5.1)
Sodium: 139 mEq/L (ref 135–145)

## 2020-06-09 LAB — HEPATIC FUNCTION PANEL
ALT: 12 U/L (ref 0–53)
AST: 21 U/L (ref 0–37)
Albumin: 4.1 g/dL (ref 3.5–5.2)
Alkaline Phosphatase: 87 U/L (ref 39–117)
Bilirubin, Direct: 0.3 mg/dL (ref 0.0–0.3)
Total Bilirubin: 1.4 mg/dL — ABNORMAL HIGH (ref 0.2–1.2)
Total Protein: 7.5 g/dL (ref 6.0–8.3)

## 2020-06-09 LAB — MICROALBUMIN / CREATININE URINE RATIO
Creatinine,U: 57.3 mg/dL
Microalb Creat Ratio: 46.3 mg/g — ABNORMAL HIGH (ref 0.0–30.0)
Microalb, Ur: 26.5 mg/dL — ABNORMAL HIGH (ref 0.0–1.9)

## 2020-06-09 LAB — TSH: TSH: 2.4 u[IU]/mL (ref 0.35–4.50)

## 2020-06-09 LAB — HEMOGLOBIN A1C: Hgb A1c MFr Bld: 6.7 % — ABNORMAL HIGH (ref 4.6–6.5)

## 2020-06-09 MED ORDER — GVOKE HYPOPEN 2-PACK 1 MG/0.2ML ~~LOC~~ SOAJ: 1.0000 | Freq: Every day | SUBCUTANEOUS | 5 refills | Status: DC | PRN

## 2020-06-09 MED ORDER — DAPAGLIFLOZIN PROPANEDIOL 10 MG PO TABS
10.0000 mg | ORAL_TABLET | Freq: Every day | ORAL | 1 refills | Status: DC
Start: 2020-06-09 — End: 2020-12-09

## 2020-06-09 NOTE — Progress Notes (Signed)
Subjective:  Patient ID: Connor Dixon, male    DOB: July 26, 1934  Age: 86 y.o. MRN: [id number]  CC: Annual Exam, Hypertension, and Diabetes  This visit occurred during the SARS-CoV-2 public health emergency.  Safety protocols were in place, including screening questions prior to the visit, additional usage of staff PPE, and extensive cleaning of exam room while observing appropriate contact time as indicated for disinfecting solutions.    HPI Connor Dixon presents for a CPX.  He has had one episode of low blood sugar since I last saw him. He denies polys. He is active and denies CP/DOE/palpitations/edema/fatigue. He has intentionally lost weight with lifestyle modifications.  Outpatient Medications Prior to Visit  Medication Sig Dispense Refill  . allopurinol (ZYLOPRIM) 100 MG tablet TAKE 1 TABLET(100 MG) BY MOUTH DAILY 90 tablet 0  . amoxicillin (AMOXIL) 500 MG capsule TAKE ONE CAPSULE BY MOUTH THREE TIMES DAILY. TAKE ONLY WHEN HAVING DENTAL PROCEDURES 30 capsule 2  . B-D ULTRAFINE III SHORT PEN 31G X 8 MM MISC USE TO ADMINISTER INSULIN THREE TIMES DAILY 300 each 2  . BD INSULIN SYRINGE U/F 31G X 5/16" 0.5 ML MISC USE AS DIRECTED FOUR TIMES DAILY 200 each 2  . blood glucose meter kit and supplies KIT Use to check blood sugar daily. DX: E11.9 1 each 0  . Blood Glucose Monitoring Suppl (ONETOUCH VERIO) w/Device KIT 1 Device by Does not apply route daily. Use as directed 1 kit 0  . calcitRIOL (ROCALTROL) 0.25 MCG capsule Take 0.25 mcg by mouth every other day.   5  . carvedilol (COREG) 6.25 MG tablet TAKE 1 TABLET(6.25 MG) BY MOUTH TWICE DAILY WITH A MEAL 180 tablet 1  . ELIQUIS 2.5 MG TABS tablet TAKE 1 TABLET BY MOUTH TWICE DAILY 60 tablet 5  . furosemide (LASIX) 40 MG tablet Take 2 tablets in the morning and 1 tablet in the afternoon. 270 tablet 3  . glucose blood test strip Use to check blood sugar QID. DX: E11.9 200 each 2  . insulin glargine (LANTUS SOLOSTAR) 100 UNIT/ML Solostar Pen  Inject 25 Units into the skin daily at 10 pm. 3 mL 1  . insulin glargine (LANTUS) 100 UNIT/ML injection ADMINISTER 25 UNITS UNDER THE SKIN AT BEDTIME 30 mL 1  . insulin isophane & regular human (HUMULIN 70/30 MIX) (70-30) 100 UNIT/ML KwikPen Inject 65 Units into the skin 2 (two) times daily. 3 mL 1  . insulin NPH-regular Human (HUMULIN 70/30) (70-30) 100 UNIT/ML injection Inject 65 Units into the skin daily. 20 mL 3  . Lancets 33G MISC Use to test blood sugar daily. DX: E11.9 100 each 3  . loratadine (CLARITIN) 10 MG tablet Take 10 mg by mouth daily as needed for allergies.    Marland Kitchen losartan (COZAAR) 25 MG tablet TAKE 1 TABLET(25 MG) BY MOUTH DAILY 90 tablet 0  . pravastatin (PRAVACHOL) 40 MG tablet TAKE 1 TABLET(40 MG) BY MOUTH DAILY 90 tablet 1  . INVOKANA 100 MG TABS tablet TAKE 1 TABLET(100 MG) BY MOUTH DAILY BEFORE BREAKFAST 90 tablet 0   No facility-administered medications prior to visit.    ROS Review of Systems  Constitutional: Negative for appetite change, chills, diaphoresis, fatigue and fever.  HENT: Negative.   Eyes: Negative for visual disturbance.  Respiratory: Negative for cough, chest tightness, shortness of breath and wheezing.   Cardiovascular: Negative for chest pain, palpitations and leg swelling.  Gastrointestinal: Negative for abdominal pain, constipation, diarrhea, nausea and vomiting.  Endocrine: Negative.  Negative for polydipsia, polyphagia and polyuria.  Genitourinary: Negative.   Musculoskeletal: Negative for arthralgias and myalgias.  Skin: Negative.  Negative for color change.  Neurological: Negative.  Negative for dizziness, weakness and light-headedness.  Hematological: Negative for adenopathy. Does not bruise/bleed easily.  Psychiatric/Behavioral: Negative.     Objective:  BP 126/86   Pulse 78   Temp 98.3 F (36.8 C) (Oral)   Ht '5\' 8"'  (1.727 m)   Wt 250 lb (113.4 kg)   SpO2 96%   BMI 38.01 kg/m   BP Readings from Last 3 Encounters:  06/09/20  126/86  01/16/20 130/80  12/10/19 (!) 130/62    Wt Readings from Last 3 Encounters:  06/09/20 250 lb (113.4 kg)  01/16/20 270 lb (122.5 kg)  12/10/19 (!) 271 lb (122.9 kg)    Physical Exam Vitals reviewed.  Constitutional:      General: He is not in acute distress.    Appearance: Normal appearance. He is obese. He is not toxic-appearing or diaphoretic.  HENT:     Nose: Nose normal.     Mouth/Throat:     Mouth: Mucous membranes are moist.  Eyes:     General: No scleral icterus.    Conjunctiva/sclera: Conjunctivae normal.  Cardiovascular:     Rate and Rhythm: Normal rate and regular rhythm.     Heart sounds: No murmur heard.   Pulmonary:     Effort: Pulmonary effort is normal.     Breath sounds: No stridor. No wheezing, rhonchi or rales.  Abdominal:     General: Abdomen is protuberant. Bowel sounds are normal. There is no distension.     Palpations: Abdomen is soft. There is no hepatomegaly, splenomegaly or mass.  Musculoskeletal:        General: Normal range of motion.     Cervical back: Neck supple.     Right lower leg: No edema.     Left lower leg: No edema.  Lymphadenopathy:     Cervical: No cervical adenopathy.  Skin:    General: Skin is warm and dry.     Coloration: Skin is not pale.  Neurological:     General: No focal deficit present.     Mental Status: He is alert.  Psychiatric:        Mood and Affect: Mood normal.        Behavior: Behavior normal.     Lab Results  Component Value Date   WBC 7.0 06/09/2020   HGB 15.5 06/09/2020   HCT 46.6 06/09/2020   PLT 210.0 06/09/2020   GLUCOSE 129 (H) 06/09/2020   CHOL 136 12/10/2019   TRIG 80 12/10/2019   HDL 49 12/10/2019   LDLCALC 71 12/10/2019   ALT 12 06/09/2020   AST 21 06/09/2020   NA 139 06/09/2020   K 3.5 06/09/2020   CL 100 06/09/2020   CREATININE 1.95 (H) 06/09/2020   BUN 40 (H) 06/09/2020   CO2 31 06/09/2020   TSH 2.40 06/09/2020   PSA 1.77 01/06/2010   HGBA1C 6.7 (H) 06/09/2020    MICROALBUR 26.5 (H) 06/09/2020    No results found.  Assessment & Plan:   [name] was seen today for annual exam, hypertension and diabetes.  Diagnoses and all orders for this visit:  Essential hypertension- His BP is adequately well controlled. -     Basic metabolic panel; Future -     CBC with Differential/Platelet; Future -     Urinalysis, Routine w reflex microscopic; Future -  TSH; Future -     Hepatic function panel; Future -     Hepatic function panel -     TSH -     Urinalysis, Routine w reflex microscopic -     CBC with Differential/Platelet -     Basic metabolic panel  Stage 3b chronic kidney disease ([place])- Will start farxiga for renal protection -     Basic metabolic panel; Future -     CBC with Differential/Platelet; Future -     Urinalysis, Routine w reflex microscopic; Future -     Urinalysis, Routine w reflex microscopic -     CBC with Differential/Platelet -     Basic metabolic panel  Encounter for general adult medical examination with abnormal findings- Exam completed, labs reviewed, vaccines reviewed, no cancer screenings indicated, pt ed material was given.  Diabetes mellitus due to underlying condition with diabetic neuropathy, without long-term current use of insulin ([place])- Will upgrade his SGLT-2 inh to farxiga. -     Glucagon (GVOKE HYPOPEN 2-PACK) 1 MG/0.2ML SOAJ; Inject 1 Act into the skin daily as needed. -     HM Diabetes Foot Exam -     Microalbumin / creatinine urine ratio; Future -     Hemoglobin A1c; Future -     Hemoglobin A1c -     Microalbumin / creatinine urine ratio -     dapagliflozin propanediol (FARXIGA) 10 MG TABS tablet; Take 1 tablet (10 mg total) by mouth daily before breakfast.  Chronic kidney disease, stage III (moderate) (HCC) -     dapagliflozin propanediol (FARXIGA) 10 MG TABS tablet; Take 1 tablet (10 mg total) by mouth daily before breakfast.  Other orders -     Pneumococcal polysaccharide vaccine 23-valent greater than  or equal to 2yo subcutaneous/IM   I have discontinued Connor Dixon's Invokana. I am also having him start on Gvoke HypoPen 2-Pack and dapagliflozin propanediol. Additionally, I am having him maintain his amoxicillin, OneTouch Verio, calcitRIOL, loratadine, insulin glargine, HumuLIN 70/30, pravastatin, carvedilol, blood glucose meter kit and supplies, Lancets 33G, Eliquis, Lantus SoloStar, insulin isophane & regular human, furosemide, glucose blood, B-D ULTRAFINE III SHORT PEN, allopurinol, losartan, and BD Insulin Syringe U/F.  Meds ordered this encounter  Medications  . Glucagon (GVOKE HYPOPEN 2-PACK) 1 MG/0.2ML SOAJ    Sig: Inject 1 Act into the skin daily as needed.    Dispense:  2 mL    Refill:  5  . dapagliflozin propanediol (FARXIGA) 10 MG TABS tablet    Sig: Take 1 tablet (10 mg total) by mouth daily before breakfast.    Dispense:  90 tablet    Refill:  1     Follow-up: Return in about 6 months (around 12/07/2020).  Scarlette Calico, MD

## 2020-06-09 NOTE — Patient Instructions (Signed)

## 2020-06-11 ENCOUNTER — Encounter: Payer: Self-pay | Admitting: Internal Medicine

## 2020-06-29 ENCOUNTER — Other Ambulatory Visit: Payer: Self-pay | Admitting: Internal Medicine

## 2020-06-29 DIAGNOSIS — E0840 Diabetes mellitus due to underlying condition with diabetic neuropathy, unspecified: Secondary | ICD-10-CM

## 2020-06-29 DIAGNOSIS — Z794 Long term (current) use of insulin: Secondary | ICD-10-CM

## 2020-06-29 DIAGNOSIS — E0821 Diabetes mellitus due to underlying condition with diabetic nephropathy: Secondary | ICD-10-CM

## 2020-07-02 ENCOUNTER — Encounter: Payer: Self-pay | Admitting: Internal Medicine

## 2020-07-03 ENCOUNTER — Other Ambulatory Visit: Payer: Self-pay

## 2020-07-03 ENCOUNTER — Ambulatory Visit: Payer: Medicare PPO | Admitting: Internal Medicine

## 2020-07-03 ENCOUNTER — Encounter: Payer: Self-pay | Admitting: Internal Medicine

## 2020-07-03 VITALS — BP 136/84 | HR 77 | Ht 68.0 in | Wt 250.0 lb

## 2020-07-03 DIAGNOSIS — I10 Essential (primary) hypertension: Secondary | ICD-10-CM | POA: Diagnosis not present

## 2020-07-03 DIAGNOSIS — I495 Sick sinus syndrome: Secondary | ICD-10-CM | POA: Diagnosis not present

## 2020-07-03 DIAGNOSIS — Z950 Presence of cardiac pacemaker: Secondary | ICD-10-CM | POA: Diagnosis not present

## 2020-07-03 NOTE — Telephone Encounter (Signed)
Patient called and said that he was not going to take dapagliflozin propanediol (FARXIGA) 10 MG TABS tablet anymore until he speaks with Dr. Yetta Barre or his CMA because his blood sugar has dropped as low as 41. He said to please reach out to his son at [phone]

## 2020-07-03 NOTE — Telephone Encounter (Signed)
Patients son called and is requesting a call back to discuss dapagliflozin propanediol (FARXIGA) 10 MG TABS tablet. He can be reached at [phone]

## 2020-07-03 NOTE — Patient Instructions (Signed)
Medication Instructions:  Your physician recommends that you continue on your current medications as directed. Please refer to the Current Medication list given to you today.  Labwork: None ordered.  Testing/Procedures: None ordered.  Follow-Up: Your physician wants you to follow-up in: one year with Gregg Taylor, MD or one of the following Advanced Practice Providers on your designated Care Team:    Amber Seiler, NP  Renee Ursuy, PA-C  Michael "Andy" Tillery, PA-C  Remote monitoring is used to monitor your Pacemaker from home. This monitoring reduces the number of office visits required to check your device to one time per year. It allows us to keep an eye on the functioning of your device to ensure it is working properly. You are scheduled for a device check from home on 08/21/2020. You may send your transmission at any time that day. If you have a wireless device, the transmission will be sent automatically. After your physician reviews your transmission, you will receive a postcard with your next transmission date.  Any Other Special Instructions Will Be Listed Below (If Applicable).  If you need a refill on your cardiac medications before your next appointment, please call your pharmacy.       

## 2020-07-03 NOTE — Progress Notes (Signed)
HPI Mr. Messler returns today for followup. He is a pleasant 86 yo man with obesity, chronic systolic heart failure, HTN, and CHB, s/p PPM insertion. He also PAF. He has lost weight. He denies chest pain or sob. No syncope.  Allergies  Allergen Reactions  . Oysters [Shellfish Allergy]     Blisters and swelling  . Apple Other (See Comments)    Sore throat     Current Outpatient Medications  Medication Sig Dispense Refill  . ACCU-CHEK GUIDE test strip USE TO CHECK FOUR TIMES DAILY 200 strip 5  . allopurinol (ZYLOPRIM) 100 MG tablet TAKE 1 TABLET(100 MG) BY MOUTH DAILY 90 tablet 0  . amoxicillin (AMOXIL) 500 MG capsule TAKE ONE CAPSULE BY MOUTH THREE TIMES DAILY. TAKE ONLY WHEN HAVING DENTAL PROCEDURES 30 capsule 2  . B-D ULTRAFINE III SHORT PEN 31G X 8 MM MISC USE TO ADMINISTER INSULIN THREE TIMES DAILY 300 each 2  . BD INSULIN SYRINGE U/F 31G X 5/16" 0.5 ML MISC USE AS DIRECTED FOUR TIMES DAILY 200 each 2  . blood glucose meter kit and supplies KIT Use to check blood sugar daily. DX: E11.9 1 each 0  . Blood Glucose Monitoring Suppl (ONETOUCH VERIO) w/Device KIT 1 Device by Does not apply route daily. Use as directed 1 kit 0  . calcitRIOL (ROCALTROL) 0.25 MCG capsule Take 0.25 mcg by mouth every other day.   5  . carvedilol (COREG) 6.25 MG tablet TAKE 1 TABLET(6.25 MG) BY MOUTH TWICE DAILY WITH A MEAL 180 tablet 1  . dapagliflozin propanediol (FARXIGA) 10 MG TABS tablet Take 1 tablet (10 mg total) by mouth daily before breakfast. 90 tablet 1  . ELIQUIS 2.5 MG TABS tablet TAKE 1 TABLET BY MOUTH TWICE DAILY 60 tablet 5  . furosemide (LASIX) 40 MG tablet Take 2 tablets in the morning and 1 tablet in the afternoon. 270 tablet 3  . Glucagon (GVOKE HYPOPEN 2-PACK) 1 MG/0.2ML SOAJ Inject 1 Act into the skin daily as needed. 2 mL 5  . insulin glargine (LANTUS SOLOSTAR) 100 UNIT/ML Solostar Pen Inject 25 Units into the skin daily at 10 pm. 3 mL 1  . insulin glargine (LANTUS) 100 UNIT/ML  injection ADMINISTER 25 UNITS UNDER THE SKIN AT BEDTIME 30 mL 1  . insulin isophane & regular human (HUMULIN 70/30 MIX) (70-30) 100 UNIT/ML KwikPen Inject 65 Units into the skin 2 (two) times daily. 3 mL 1  . insulin NPH-regular Human (HUMULIN 70/30) (70-30) 100 UNIT/ML injection Inject 65 Units into the skin daily. 20 mL 3  . Lancets 33G MISC Use to test blood sugar daily. DX: E11.9 100 each 3  . loratadine (CLARITIN) 10 MG tablet Take 10 mg by mouth daily as needed for allergies.    Marland Kitchen losartan (COZAAR) 25 MG tablet TAKE 1 TABLET(25 MG) BY MOUTH DAILY 90 tablet 0  . pravastatin (PRAVACHOL) 40 MG tablet TAKE 1 TABLET(40 MG) BY MOUTH DAILY 90 tablet 1   No current facility-administered medications for this visit.     Past Medical History:  Diagnosis Date  . Acute on chronic diastolic congestive heart failure ([place])   . Arthus phenomenon   . Atrioventricular block, complete ([place])   . Cardiac pacemaker in situ 07/22/2010  . Chronic kidney disease, stage 3 ([facility])    Presumably from longstanding DM & HTN. Scr has gradually increased over the past 2 years (1.6 in 12/2010, 2 in 03/2011, and up to 2.1 in 01/2013)  . DIABETES  MELLITUS, TYPE I, ADULT ONSET 08/06/2007  . DM (diabetes mellitus) ([place])    adult onset   . Essential hypertension, benign 08/06/2007   Very good BP control  . History of second degree heart block   . HLD (hyperlipidemia)   . HYPERLIPIDEMIA 08/06/2007  . Hypopotassemia   . Morbid obesity ([place])   . Obesity   . Other and unspecified angina pectoris    typical  . Type I (juvenile type) diabetes mellitus without mention of complication, not stated as uncontrolled    (04/25/13 OV with Dr. Marval Regal) = Poorly controlled & is working  w/Dr. Crissie Sickles to improve this. Most recent Hgb A1c was 8.2% but Mr. Mash reports it was down to 7.6 last month.    ROS:   All systems reviewed and negative except as noted in the HPI.   Past Surgical History:  Procedure Laterality Date  .  CATARACT EXTRACTION  8/08   [place]  . PPM GENERATOR CHANGEOUT N/A 02/13/2019   Procedure: PPM GENERATOR CHANGEOUT;  Surgeon: Evans Lance, MD;  Location: [facility];  Service: Cardiovascular;  Laterality: N/A;  . PTVDP  12/11   PPM - St. Jude  . TOTAL HIP ARTHROPLASTY  04/05/06   right     Family History  Problem Relation Age of Onset  . Asthma Father   . Coronary artery disease Father   . Diabetes Father   . Hyperlipidemia Father   . Stroke Father 75  . Coronary artery disease Mother   . Asthma Mother   . Heart attack Mother 7  . Colon cancer Neg Hx        no definate prostate CA      Social History   Socioeconomic History  . Marital status: Widowed    Spouse name: Not on file  . Number of children: 2  . Years of education: Not on file  . Highest education level: Not on file  Occupational History  . Occupation: retired  Tobacco Use  . Smoking status: Never Smoker  . Smokeless tobacco: Never Used  Vaping Use  . Vaping Use: Never used  Substance and Sexual Activity  . Alcohol use: No  . Drug use: No  . Sexual activity: Not Currently  Other Topics Concern  . Not on file  Social History Narrative   [employer] BA, 2 Master's degrees. [place] - for certification Education specialist.    Retired Tree surgeon.   Active - travels.   Married - 1961; 2 sons ([place]) and 5 grandchildren.        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    Social Determinants of Health   Financial Resource Strain: Low Risk   . Difficulty of Paying Living Expenses: Not hard at all  Food Insecurity: No Food Insecurity  . Worried About Charity fundraiser in the Last Year: Never true  . Ran Out of Food in the Last Year: Never true  Transportation Needs: No Transportation Needs  . Lack of Transportation (Medical): No  . Lack of Transportation (Non-Medical): No  Physical Activity: Insufficiently Active  . Days of Exercise per Week: 5 days  .  Minutes of Exercise per Session: 20 min  Stress: No Stress Concern Present  . Feeling of Stress : Not at all  Social Connections: Socially Isolated  . Frequency of Communication with Friends and Family: More than three times a week  . Frequency of Social Gatherings with Friends and Family: Never  .  Attends Religious Services: Never  . Active Member of Clubs or Organizations: No  . Attends Archivist Meetings: Never  . Marital Status: Widowed  Intimate Partner Violence: Not At Risk  . Fear of Current or Ex-Partner: No  . Emotionally Abused: No  . Physically Abused: No  . Sexually Abused: No     BP 136/84   Pulse 77   Ht _0  (1.727 m)   Wt 250 lb (113.4 kg)   SpO2 96%   BMI 38.01 kg/m   Physical Exam:  Well appearing NAD HEENT: Unremarkable Neck:  No JVD, no thyromegally Lymphatics:  No adenopathy Back:  No CVA tenderness Lungs:  Clear with no wheezes HEART:  Regular rate rhythm, no murmurs, no rubs, no clicks Abd:  soft, positive bowel sounds, no organomegally, no rebound, no guarding Ext:  2 plus pulses, no edema, no cyanosis, no clubbing Skin:  No rashes no nodules Neuro:  CN II through XII intact, motor grossly intact   DEVICE  Normal device function.  See PaceArt for details.   Assess/Plan: 1. CHB - he is asymptomatic,s/p PPM insertion. 2. PPM - his St. Jude DDD PM is working normally.  3. PAF - he is maintaining NSR.  4. HTN - his sbp is a little elevated. He is encouraged to continue to lose weight. His goal is 220 lb.  Carleene Overlie Edis Huish,MD

## 2020-07-06 LAB — CUP PACEART INCLINIC DEVICE CHECK
Battery Remaining Longevity: 100 mo
Battery Voltage: 2.99 V
Brady Statistic RA Percent Paced: 0.91 %
Brady Statistic RV Percent Paced: 99 %
Date Time Interrogation Session: 20220218142300
Implantable Lead Implant Date: 20111202
Implantable Lead Implant Date: 20111202
Implantable Lead Location: 753859
Implantable Lead Location: 753860
Implantable Pulse Generator Implant Date: 20200930
Lead Channel Impedance Value: 425 Ohm
Lead Channel Impedance Value: 475 Ohm
Lead Channel Pacing Threshold Amplitude: 0.75 V
Lead Channel Pacing Threshold Amplitude: 1.625 V
Lead Channel Pacing Threshold Pulse Width: 0.4 ms
Lead Channel Pacing Threshold Pulse Width: 0.8 ms
Lead Channel Sensing Intrinsic Amplitude: 2.2 mV
Lead Channel Setting Pacing Amplitude: 1.875
Lead Channel Setting Pacing Amplitude: 2 V
Lead Channel Setting Pacing Pulse Width: 0.8 ms
Lead Channel Setting Sensing Sensitivity: 4 mV
Pulse Gen Model: 2272
Pulse Gen Serial Number: 9156495

## 2020-07-06 NOTE — Telephone Encounter (Signed)
I attempted to get clarification from the pt's son, [name], he prefers to speak with PCP via MyChart in regard.

## 2020-07-07 ENCOUNTER — Other Ambulatory Visit: Payer: Self-pay | Admitting: Internal Medicine

## 2020-07-07 DIAGNOSIS — E0840 Diabetes mellitus due to underlying condition with diabetic neuropathy, unspecified: Secondary | ICD-10-CM

## 2020-07-07 DIAGNOSIS — E0821 Diabetes mellitus due to underlying condition with diabetic nephropathy: Secondary | ICD-10-CM

## 2020-07-07 MED ORDER — INSULIN ISOPHANE & REGULAR (HUMAN 70-30)100 UNIT/ML KWIKPEN: PEN_INJECTOR | SUBCUTANEOUS | 1 refills | Status: DC

## 2020-08-04 DIAGNOSIS — N2581 Secondary hyperparathyroidism of renal origin: Secondary | ICD-10-CM | POA: Diagnosis not present

## 2020-08-04 DIAGNOSIS — N1831 Chronic kidney disease, stage 3a: Secondary | ICD-10-CM | POA: Diagnosis not present

## 2020-08-04 DIAGNOSIS — I129 Hypertensive chronic kidney disease with stage 1 through stage 4 chronic kidney disease, or unspecified chronic kidney disease: Secondary | ICD-10-CM | POA: Diagnosis not present

## 2020-08-04 DIAGNOSIS — E1122 Type 2 diabetes mellitus with diabetic chronic kidney disease: Secondary | ICD-10-CM | POA: Diagnosis not present

## 2020-08-04 DIAGNOSIS — E785 Hyperlipidemia, unspecified: Secondary | ICD-10-CM | POA: Diagnosis not present

## 2020-08-04 DIAGNOSIS — I5033 Acute on chronic diastolic (congestive) heart failure: Secondary | ICD-10-CM | POA: Diagnosis not present

## 2020-08-04 DIAGNOSIS — E1165 Type 2 diabetes mellitus with hyperglycemia: Secondary | ICD-10-CM | POA: Diagnosis not present

## 2020-08-08 ENCOUNTER — Other Ambulatory Visit: Payer: Self-pay | Admitting: Internal Medicine

## 2020-08-08 DIAGNOSIS — M10071 Idiopathic gout, right ankle and foot: Secondary | ICD-10-CM

## 2020-08-08 DIAGNOSIS — M1A3790 Chronic gout due to renal impairment, unspecified ankle and foot, without tophus (tophi): Secondary | ICD-10-CM

## 2020-08-21 ENCOUNTER — Ambulatory Visit (INDEPENDENT_AMBULATORY_CARE_PROVIDER_SITE_OTHER): Payer: Medicare PPO

## 2020-08-21 DIAGNOSIS — I442 Atrioventricular block, complete: Secondary | ICD-10-CM

## 2020-08-21 LAB — CUP PACEART REMOTE DEVICE CHECK
Battery Remaining Longevity: 101 mo
Battery Remaining Percentage: 95.5 %
Battery Voltage: 3.01 V
Brady Statistic AP VP Percent: 1.1 %
Brady Statistic AP VS Percent: 1 %
Brady Statistic AS VP Percent: 98 %
Brady Statistic AS VS Percent: 1 %
Brady Statistic RA Percent Paced: 1 %
Brady Statistic RV Percent Paced: 99 %
Date Time Interrogation Session: 20220408040023
Implantable Lead Implant Date: 20111202
Implantable Lead Implant Date: 20111202
Implantable Lead Location: 753859
Implantable Lead Location: 753860
Implantable Pulse Generator Implant Date: 20200930
Lead Channel Impedance Value: 390 Ohm
Lead Channel Impedance Value: 440 Ohm
Lead Channel Pacing Threshold Amplitude: 0.75 V
Lead Channel Pacing Threshold Amplitude: 1.625 V
Lead Channel Pacing Threshold Pulse Width: 0.4 ms
Lead Channel Pacing Threshold Pulse Width: 0.8 ms
Lead Channel Sensing Intrinsic Amplitude: 1.5 mV
Lead Channel Sensing Intrinsic Amplitude: 7 mV
Lead Channel Setting Pacing Amplitude: 1.875
Lead Channel Setting Pacing Amplitude: 2 V
Lead Channel Setting Pacing Pulse Width: 0.8 ms
Lead Channel Setting Sensing Sensitivity: 4 mV
Pulse Gen Model: 2272
Pulse Gen Serial Number: 9156495

## 2020-08-30 ENCOUNTER — Other Ambulatory Visit: Payer: Self-pay | Admitting: Internal Medicine

## 2020-08-30 DIAGNOSIS — M10071 Idiopathic gout, right ankle and foot: Secondary | ICD-10-CM

## 2020-08-30 DIAGNOSIS — E0840 Diabetes mellitus due to underlying condition with diabetic neuropathy, unspecified: Secondary | ICD-10-CM

## 2020-08-30 DIAGNOSIS — I10 Essential (primary) hypertension: Secondary | ICD-10-CM

## 2020-09-03 NOTE — Progress Notes (Signed)
Remote pacemaker transmission.   

## 2020-09-07 ENCOUNTER — Other Ambulatory Visit: Payer: Self-pay | Admitting: Internal Medicine

## 2020-09-07 DIAGNOSIS — I10 Essential (primary) hypertension: Secondary | ICD-10-CM

## 2020-09-07 DIAGNOSIS — E785 Hyperlipidemia, unspecified: Secondary | ICD-10-CM

## 2020-09-14 DIAGNOSIS — H52203 Unspecified astigmatism, bilateral: Secondary | ICD-10-CM | POA: Diagnosis not present

## 2020-09-14 DIAGNOSIS — H35371 Puckering of macula, right eye: Secondary | ICD-10-CM | POA: Diagnosis not present

## 2020-09-14 DIAGNOSIS — H43813 Vitreous degeneration, bilateral: Secondary | ICD-10-CM | POA: Diagnosis not present

## 2020-09-14 DIAGNOSIS — E113293 Type 2 diabetes mellitus with mild nonproliferative diabetic retinopathy without macular edema, bilateral: Secondary | ICD-10-CM | POA: Diagnosis not present

## 2020-09-14 LAB — HM DIABETES EYE EXAM

## 2020-10-09 DIAGNOSIS — E113211 Type 2 diabetes mellitus with mild nonproliferative diabetic retinopathy with macular edema, right eye: Secondary | ICD-10-CM | POA: Diagnosis not present

## 2020-11-06 ENCOUNTER — Other Ambulatory Visit: Payer: Self-pay | Admitting: Internal Medicine

## 2020-11-06 DIAGNOSIS — M10071 Idiopathic gout, right ankle and foot: Secondary | ICD-10-CM

## 2020-11-06 DIAGNOSIS — M1A3790 Chronic gout due to renal impairment, unspecified ankle and foot, without tophus (tophi): Secondary | ICD-10-CM

## 2020-11-06 DIAGNOSIS — E0840 Diabetes mellitus due to underlying condition with diabetic neuropathy, unspecified: Secondary | ICD-10-CM

## 2020-11-20 ENCOUNTER — Ambulatory Visit (INDEPENDENT_AMBULATORY_CARE_PROVIDER_SITE_OTHER): Payer: Medicare PPO

## 2020-11-20 DIAGNOSIS — I442 Atrioventricular block, complete: Secondary | ICD-10-CM

## 2020-11-21 LAB — CUP PACEART REMOTE DEVICE CHECK
Battery Remaining Longevity: 83 mo
Battery Remaining Percentage: 80 %
Battery Voltage: 2.99 V
Brady Statistic AP VP Percent: 1 %
Brady Statistic AP VS Percent: 1 %
Brady Statistic AS VP Percent: 99 %
Brady Statistic AS VS Percent: 1 %
Brady Statistic RA Percent Paced: 1 %
Brady Statistic RV Percent Paced: 99 %
Date Time Interrogation Session: 20220708040015
Implantable Lead Implant Date: 20111202
Implantable Lead Implant Date: 20111202
Implantable Lead Location: 753859
Implantable Lead Location: 753860
Implantable Pulse Generator Implant Date: 20200930
Lead Channel Impedance Value: 430 Ohm
Lead Channel Impedance Value: 480 Ohm
Lead Channel Pacing Threshold Amplitude: 0.75 V
Lead Channel Pacing Threshold Amplitude: 1.75 V
Lead Channel Pacing Threshold Pulse Width: 0.4 ms
Lead Channel Pacing Threshold Pulse Width: 0.8 ms
Lead Channel Sensing Intrinsic Amplitude: 1.9 mV
Lead Channel Sensing Intrinsic Amplitude: 8.6 mV
Lead Channel Setting Pacing Amplitude: 2 V
Lead Channel Setting Pacing Amplitude: 2 V
Lead Channel Setting Pacing Pulse Width: 0.8 ms
Lead Channel Setting Sensing Sensitivity: 4 mV
Pulse Gen Model: 2272
Pulse Gen Serial Number: 9156495

## 2020-12-06 ENCOUNTER — Other Ambulatory Visit: Payer: Self-pay | Admitting: Internal Medicine

## 2020-12-06 DIAGNOSIS — E0840 Diabetes mellitus due to underlying condition with diabetic neuropathy, unspecified: Secondary | ICD-10-CM

## 2020-12-06 DIAGNOSIS — E0821 Diabetes mellitus due to underlying condition with diabetic nephropathy: Secondary | ICD-10-CM

## 2020-12-06 MED ORDER — INSULIN ISOPHANE & REGULAR (HUMAN 70-30)100 UNIT/ML KWIKPEN: PEN_INJECTOR | SUBCUTANEOUS | 0 refills | Status: DC

## 2020-12-07 DIAGNOSIS — N189 Chronic kidney disease, unspecified: Secondary | ICD-10-CM | POA: Diagnosis not present

## 2020-12-07 DIAGNOSIS — N1831 Chronic kidney disease, stage 3a: Secondary | ICD-10-CM | POA: Diagnosis not present

## 2020-12-09 ENCOUNTER — Ambulatory Visit: Payer: Medicare PPO | Admitting: Internal Medicine

## 2020-12-09 ENCOUNTER — Encounter: Payer: Self-pay | Admitting: Internal Medicine

## 2020-12-09 ENCOUNTER — Other Ambulatory Visit: Payer: Self-pay

## 2020-12-09 VITALS — BP 126/80 | HR 78 | Temp 98.2°F | Resp 16 | Ht 68.0 in | Wt 247.0 lb

## 2020-12-09 DIAGNOSIS — N1832 Chronic kidney disease, stage 3b: Secondary | ICD-10-CM | POA: Diagnosis not present

## 2020-12-09 DIAGNOSIS — E1122 Type 2 diabetes mellitus with diabetic chronic kidney disease: Secondary | ICD-10-CM | POA: Diagnosis not present

## 2020-12-09 DIAGNOSIS — I10 Essential (primary) hypertension: Secondary | ICD-10-CM | POA: Diagnosis not present

## 2020-12-09 DIAGNOSIS — E785 Hyperlipidemia, unspecified: Secondary | ICD-10-CM | POA: Diagnosis not present

## 2020-12-09 DIAGNOSIS — Z794 Long term (current) use of insulin: Secondary | ICD-10-CM

## 2020-12-09 DIAGNOSIS — N1831 Chronic kidney disease, stage 3a: Secondary | ICD-10-CM | POA: Diagnosis not present

## 2020-12-09 LAB — HEMOGLOBIN A1C: Hgb A1c MFr Bld: 8 % — ABNORMAL HIGH (ref 4.6–6.5)

## 2020-12-09 LAB — LIPID PANEL
Cholesterol: 143 mg/dL (ref 0–200)
HDL: 49.2 mg/dL (ref >39.00)
LDL Cholesterol: 78 mg/dL (ref 0–99)
NonHDL: 93.55
Total CHOL/HDL Ratio: 3
Triglycerides: 78 mg/dL (ref 0.0–149.0)
VLDL: 15.6 mg/dL (ref 0.0–40.0)

## 2020-12-09 NOTE — Progress Notes (Signed)
Subjective:  Patient ID: Connor Dixon, male    DOB: 1934-12-11  Age: 86 y.o. MRN: [id number]  CC: Hypertension, Hyperlipidemia, and Diabetes  This visit occurred during the SARS-CoV-2 public health emergency.  Safety protocols were in place, including screening questions prior to the visit, additional usage of staff PPE, and extensive cleaning of exam room while observing appropriate contact time as indicated for disinfecting solutions.    HPI Connor Dixon presents for f/up -  By his estimation his blood sugars are mostly well controlled.  He has rare spikes up into the 300s with no symptoms. I recommended that he take an SGLT2 inhibitor but he told me it precipitated symptomatic hypoglycemia so he is no longer taking it.  He denies polys.  He tells me he saw his kidney doctor 2 days ago.  He is not interested in using a CGM.  Outpatient Medications Prior to Visit  Medication Sig Dispense Refill   ACCU-CHEK GUIDE test strip USE TO CHECK FOUR TIMES DAILY 200 strip 5   allopurinol (ZYLOPRIM) 100 MG tablet TAKE 1 TABLET(100 MG) BY MOUTH DAILY 90 tablet 0   amoxicillin (AMOXIL) 500 MG capsule TAKE ONE CAPSULE BY MOUTH THREE TIMES DAILY. TAKE ONLY WHEN HAVING DENTAL PROCEDURES 30 capsule 2   B-D ULTRAFINE III SHORT PEN 31G X 8 MM MISC USE TO ADMINISTER INSULIN THREE TIMES DAILY 300 each 2   BD INSULIN SYRINGE U/F 31G X 5/16" 0.5 ML MISC USE AS DIRECTED FOUR TIMES DAILY 200 each 2   blood glucose meter kit and supplies KIT Use to check blood sugar daily. DX: E11.9 1 each 0   Blood Glucose Monitoring Suppl (ONETOUCH VERIO) w/Device KIT 1 Device by Does not apply route daily. Use as directed 1 kit 0   calcitRIOL (ROCALTROL) 0.25 MCG capsule Take 0.25 mcg by mouth every other day.   5   carvedilol (COREG) 6.25 MG tablet TAKE 1 TABLET(6.25 MG) BY MOUTH TWICE DAILY WITH A MEAL 180 tablet 1   ELIQUIS 2.5 MG TABS tablet TAKE 1 TABLET BY MOUTH TWICE DAILY 60 tablet 5   furosemide (LASIX) 40 MG tablet  Take 2 tablets in the morning and 1 tablet in the afternoon. 270 tablet 3   Glucagon (GVOKE HYPOPEN 2-PACK) 1 MG/0.2ML SOAJ Inject 1 Act into the skin daily as needed. 2 mL 5   insulin isophane & regular human KwikPen (HUMULIN 70/30 MIX) (70-30) 100 UNIT/ML KwikPen 35 units QAM 20 units QPM 3 mL 0   Lancets 33G MISC Use to test blood sugar daily. DX: E11.9 100 each 3   loratadine (CLARITIN) 10 MG tablet Take 10 mg by mouth daily as needed for allergies.     losartan (COZAAR) 25 MG tablet TAKE 1 TABLET(25 MG) BY MOUTH DAILY 90 tablet 1   pravastatin (PRAVACHOL) 40 MG tablet TAKE 1 TABLET(40 MG) BY MOUTH DAILY 90 tablet 1   dapagliflozin propanediol (FARXIGA) 10 MG TABS tablet Take 1 tablet (10 mg total) by mouth daily before breakfast. (Patient not taking: Reported on 12/09/2020) 90 tablet 1   No facility-administered medications prior to visit.    ROS Review of Systems  Constitutional:  Negative for diaphoresis and fatigue.  HENT: Negative.    Eyes: Negative.   Respiratory:  Negative for cough, chest tightness, shortness of breath and wheezing.   Cardiovascular:  Negative for chest pain, palpitations and leg swelling.  Gastrointestinal:  Negative for abdominal pain, diarrhea, nausea and vomiting.  Endocrine: Negative.  Negative for polydipsia, polyphagia and polyuria.  Genitourinary:  Negative for difficulty urinating and hematuria.  Musculoskeletal: Negative.  Negative for myalgias.  Skin: Negative.  Negative for color change.  Neurological:  Negative for dizziness.  Hematological:  Negative for adenopathy. Does not bruise/bleed easily.   Objective:  BP 126/80 (BP Location: Right Arm, Patient Position: Sitting, Cuff Size: Large)   Pulse 78   Temp 98.2 F (36.8 C) (Oral)   Resp 16   Ht '5\' 8"'  (1.727 m)   Wt 247 lb (112 kg)   SpO2 95%   BMI 37.56 kg/m   BP Readings from Last 3 Encounters:  12/09/20 126/80  07/03/20 136/84  06/09/20 126/86    Wt Readings from Last 3  Encounters:  12/09/20 247 lb (112 kg)  07/03/20 250 lb (113.4 kg)  06/09/20 250 lb (113.4 kg)    Physical Exam Vitals reviewed.  HENT:     Nose: Nose normal.     Mouth/Throat:     Mouth: Mucous membranes are moist.  Eyes:     Conjunctiva/sclera: Conjunctivae normal.  Cardiovascular:     Rate and Rhythm: Normal rate and regular rhythm.     Heart sounds: Murmur heard.  Systolic murmur is present with a grade of 1/6.  Pulmonary:     Breath sounds: No stridor. No wheezing, rhonchi or rales.  Abdominal:     General: Abdomen is protuberant. Bowel sounds are normal. There is no distension.     Palpations: Abdomen is soft. There is no hepatomegaly, splenomegaly or mass.     Tenderness: There is no abdominal tenderness.  Musculoskeletal:        General: Normal range of motion.     Cervical back: Neck supple.     Right lower leg: No edema.     Left lower leg: No edema.  Lymphadenopathy:     Cervical: No cervical adenopathy.  Skin:    General: Skin is warm and dry.  Neurological:     General: No focal deficit present.     Mental Status: He is alert.    Lab Results  Component Value Date   WBC 7.0 06/09/2020   HGB 15.5 06/09/2020   HCT 46.6 06/09/2020   PLT 210.0 06/09/2020   GLUCOSE 129 (H) 06/09/2020   CHOL 143 12/09/2020   TRIG 78.0 12/09/2020   HDL 49.20 12/09/2020   LDLCALC 78 12/09/2020   ALT 12 06/09/2020   AST 21 06/09/2020   NA 139 06/09/2020   K 3.5 06/09/2020   CL 100 06/09/2020   CREATININE 1.95 (H) 06/09/2020   BUN 40 (H) 06/09/2020   CO2 31 06/09/2020   TSH 2.40 06/09/2020   PSA 1.77 01/06/2010   HGBA1C 8.0 (H) 12/09/2020   MICROALBUR 26.5 (H) 06/09/2020    No results found.  Assessment & Plan:   Connor Dixon was seen today for hypertension, hyperlipidemia and diabetes.  Diagnoses and all orders for this visit:  Stage 3a chronic kidney disease ([place])- Followed by nephrology. -     Cancel: Basic metabolic panel; Future  Essential hypertension- His  blood pressure is adequately well controlled. -     Cancel: CBC with Differential/Platelet; Future -     Cancel: Basic metabolic panel; Future  Hyperlipidemia with target LDL less than 70- LDL goal achieved. Doing well on the statin  -     Lipid panel; Future -     Lipid panel  Type 2 diabetes mellitus with stage 3b chronic kidney disease, with long-term  current use of insulin ([place])- His blood sugar is adequately well controlled. -     Hemoglobin A1c; Future -     Hemoglobin A1c  I have discontinued Connor Dixon's dapagliflozin propanediol. I am also having him maintain his amoxicillin, OneTouch Verio, calcitRIOL, loratadine, blood glucose meter kit and supplies, Lancets 33G, furosemide, BD Insulin Syringe U/F, Gvoke HypoPen 2-Pack, Accu-Chek Guide, Eliquis, losartan, carvedilol, pravastatin, allopurinol, B-D ULTRAFINE III SHORT PEN, and insulin isophane & regular human KwikPen.  No orders of the defined types were placed in this encounter.    Follow-up: Return in about 6 months (around 06/11/2021).  Scarlette Calico, MD

## 2020-12-09 NOTE — Patient Instructions (Signed)

## 2020-12-11 NOTE — Progress Notes (Signed)
Remote pacemaker transmission.   

## 2020-12-23 ENCOUNTER — Telehealth: Payer: Self-pay | Admitting: Internal Medicine

## 2020-12-23 ENCOUNTER — Other Ambulatory Visit: Payer: Self-pay | Admitting: Internal Medicine

## 2020-12-23 DIAGNOSIS — E559 Vitamin D deficiency, unspecified: Secondary | ICD-10-CM

## 2020-12-23 MED ORDER — CALCITRIOL 0.25 MCG PO CAPS: 0.2500 ug | ORAL_CAPSULE | ORAL | 1 refills | Status: DC

## 2020-12-23 NOTE — Telephone Encounter (Signed)
1.Medication Requested: calcitRIOL (ROCALTROL) 0.25 MCG capsule   2. Pharmacy (Name, Street, [facility]): [facility] [phone] - [place], [place] - [address] AT [facility]  Phone:  [phone] Fax:  [phone]   3. On Med List: yes  4. Last Visit with PCP: 07.27.22  5. Next visit date with PCP: 01.25.23   Agent: Please be advised that RX refills may take up to 3 business days. We ask that you follow-up with your pharmacy.

## 2020-12-28 ENCOUNTER — Other Ambulatory Visit: Payer: Self-pay | Admitting: Internal Medicine

## 2020-12-28 DIAGNOSIS — E0840 Diabetes mellitus due to underlying condition with diabetic neuropathy, unspecified: Secondary | ICD-10-CM

## 2020-12-29 ENCOUNTER — Other Ambulatory Visit: Payer: Self-pay | Admitting: Internal Medicine

## 2020-12-29 ENCOUNTER — Telehealth: Payer: Self-pay

## 2020-12-29 DIAGNOSIS — E1122 Type 2 diabetes mellitus with diabetic chronic kidney disease: Secondary | ICD-10-CM

## 2020-12-29 DIAGNOSIS — Z794 Long term (current) use of insulin: Secondary | ICD-10-CM

## 2020-12-29 MED ORDER — HUMULIN 70/30 (70-30) 100 UNIT/ML ~~LOC~~ SUSP: SUBCUTANEOUS | 3 refills | Status: DC

## 2020-12-29 NOTE — Telephone Encounter (Signed)
Patient calling with a few questions about his insulin.

## 2020-12-29 NOTE — Telephone Encounter (Signed)
Pt was inquiring about refill for Humulin 70/30.   He stated that he gets more insulin using vials that using pens. He only uses pens when he travels for convenience purposes over vials. He would appreciate the vial refill that was denied.   Please advise.

## 2021-02-02 DIAGNOSIS — N189 Chronic kidney disease, unspecified: Secondary | ICD-10-CM | POA: Diagnosis not present

## 2021-02-02 DIAGNOSIS — N1831 Chronic kidney disease, stage 3a: Secondary | ICD-10-CM | POA: Diagnosis not present

## 2021-02-02 DIAGNOSIS — I5033 Acute on chronic diastolic (congestive) heart failure: Secondary | ICD-10-CM | POA: Diagnosis not present

## 2021-02-02 DIAGNOSIS — N2581 Secondary hyperparathyroidism of renal origin: Secondary | ICD-10-CM | POA: Diagnosis not present

## 2021-02-02 DIAGNOSIS — E1165 Type 2 diabetes mellitus with hyperglycemia: Secondary | ICD-10-CM | POA: Diagnosis not present

## 2021-02-02 DIAGNOSIS — M109 Gout, unspecified: Secondary | ICD-10-CM | POA: Diagnosis not present

## 2021-02-02 DIAGNOSIS — I129 Hypertensive chronic kidney disease with stage 1 through stage 4 chronic kidney disease, or unspecified chronic kidney disease: Secondary | ICD-10-CM | POA: Diagnosis not present

## 2021-02-02 DIAGNOSIS — E1122 Type 2 diabetes mellitus with diabetic chronic kidney disease: Secondary | ICD-10-CM | POA: Diagnosis not present

## 2021-02-02 DIAGNOSIS — Z23 Encounter for immunization: Secondary | ICD-10-CM | POA: Diagnosis not present

## 2021-02-02 DIAGNOSIS — D631 Anemia in chronic kidney disease: Secondary | ICD-10-CM | POA: Diagnosis not present

## 2021-02-04 ENCOUNTER — Other Ambulatory Visit: Payer: Self-pay | Admitting: Internal Medicine

## 2021-02-04 DIAGNOSIS — M1A3790 Chronic gout due to renal impairment, unspecified ankle and foot, without tophus (tophi): Secondary | ICD-10-CM

## 2021-02-04 DIAGNOSIS — M10071 Idiopathic gout, right ankle and foot: Secondary | ICD-10-CM

## 2021-02-16 DIAGNOSIS — Z794 Long term (current) use of insulin: Secondary | ICD-10-CM | POA: Diagnosis not present

## 2021-02-16 DIAGNOSIS — E1122 Type 2 diabetes mellitus with diabetic chronic kidney disease: Secondary | ICD-10-CM | POA: Diagnosis not present

## 2021-02-16 DIAGNOSIS — I2510 Atherosclerotic heart disease of native coronary artery without angina pectoris: Secondary | ICD-10-CM | POA: Diagnosis not present

## 2021-02-16 DIAGNOSIS — J309 Allergic rhinitis, unspecified: Secondary | ICD-10-CM | POA: Diagnosis not present

## 2021-02-16 DIAGNOSIS — N2581 Secondary hyperparathyroidism of renal origin: Secondary | ICD-10-CM | POA: Diagnosis not present

## 2021-02-16 DIAGNOSIS — I509 Heart failure, unspecified: Secondary | ICD-10-CM | POA: Diagnosis not present

## 2021-02-16 DIAGNOSIS — E785 Hyperlipidemia, unspecified: Secondary | ICD-10-CM | POA: Diagnosis not present

## 2021-02-16 DIAGNOSIS — I130 Hypertensive heart and chronic kidney disease with heart failure and stage 1 through stage 4 chronic kidney disease, or unspecified chronic kidney disease: Secondary | ICD-10-CM | POA: Diagnosis not present

## 2021-02-19 ENCOUNTER — Ambulatory Visit (INDEPENDENT_AMBULATORY_CARE_PROVIDER_SITE_OTHER): Payer: Medicare PPO

## 2021-02-19 DIAGNOSIS — I442 Atrioventricular block, complete: Secondary | ICD-10-CM

## 2021-02-21 LAB — CUP PACEART REMOTE DEVICE CHECK
Battery Remaining Longevity: 73 mo
Battery Remaining Percentage: 77 %
Battery Voltage: 2.99 V
Brady Statistic AP VP Percent: 1.1 %
Brady Statistic AP VS Percent: 1 %
Brady Statistic AS VP Percent: 98 %
Brady Statistic AS VS Percent: 1 %
Brady Statistic RA Percent Paced: 1 %
Brady Statistic RV Percent Paced: 99 %
Date Time Interrogation Session: 20221007043035
Implantable Lead Implant Date: 20111202
Implantable Lead Implant Date: 20111202
Implantable Lead Location: 753859
Implantable Lead Location: 753860
Implantable Pulse Generator Implant Date: 20200930
Lead Channel Impedance Value: 390 Ohm
Lead Channel Impedance Value: 440 Ohm
Lead Channel Pacing Threshold Amplitude: 0.75 V
Lead Channel Pacing Threshold Amplitude: 1.875 V
Lead Channel Pacing Threshold Pulse Width: 0.4 ms
Lead Channel Pacing Threshold Pulse Width: 0.8 ms
Lead Channel Sensing Intrinsic Amplitude: 1.3 mV
Lead Channel Sensing Intrinsic Amplitude: 7.3 mV
Lead Channel Setting Pacing Amplitude: 2 V
Lead Channel Setting Pacing Amplitude: 2.125
Lead Channel Setting Pacing Pulse Width: 0.8 ms
Lead Channel Setting Sensing Sensitivity: 4 mV
Pulse Gen Model: 2272
Pulse Gen Serial Number: 9156495

## 2021-02-22 ENCOUNTER — Other Ambulatory Visit: Payer: Self-pay | Admitting: Internal Medicine

## 2021-02-22 DIAGNOSIS — I10 Essential (primary) hypertension: Secondary | ICD-10-CM

## 2021-02-22 DIAGNOSIS — M10071 Idiopathic gout, right ankle and foot: Secondary | ICD-10-CM

## 2021-02-22 DIAGNOSIS — E0840 Diabetes mellitus due to underlying condition with diabetic neuropathy, unspecified: Secondary | ICD-10-CM

## 2021-03-01 NOTE — Progress Notes (Signed)
Remote pacemaker transmission.   

## 2021-03-06 ENCOUNTER — Other Ambulatory Visit: Payer: Self-pay | Admitting: Internal Medicine

## 2021-03-06 DIAGNOSIS — E785 Hyperlipidemia, unspecified: Secondary | ICD-10-CM

## 2021-03-06 DIAGNOSIS — I10 Essential (primary) hypertension: Secondary | ICD-10-CM

## 2021-03-23 DIAGNOSIS — E113293 Type 2 diabetes mellitus with mild nonproliferative diabetic retinopathy without macular edema, bilateral: Secondary | ICD-10-CM | POA: Diagnosis not present

## 2021-04-05 ENCOUNTER — Ambulatory Visit (INDEPENDENT_AMBULATORY_CARE_PROVIDER_SITE_OTHER): Payer: Medicare PPO

## 2021-04-05 DIAGNOSIS — Z0000 Encounter for general adult medical examination without abnormal findings: Secondary | ICD-10-CM

## 2021-04-05 NOTE — Patient Instructions (Signed)
Connor Dixon , Thank you for taking time to come for your Medicare Wellness Visit. I appreciate your ongoing commitment to your health goals. Please review the following plan we discussed and let me know if I can assist you in the future.   Screening recommendations/referrals: Colonoscopy: no longer  Recommended yearly ophthalmology/optometry visit for glaucoma screening and checkup Recommended yearly dental visit for hygiene and checkup  Vaccinations: Influenza vaccine: completed  Pneumococcal vaccine: completed  Tdap vaccine: 01/09/2012 Shingles vaccine: completed     Advanced directives: none   Conditions/risks identified: none   Next appointment: none   Preventive Care 65 Years and Older, Male Preventive care refers to lifestyle choices and visits with your health care provider that can promote health and wellness. What does preventive care include? A yearly physical exam. This is also called an annual well check. Dental exams once or twice a year. Routine eye exams. Ask your health care provider how often you should have your eyes checked. Personal lifestyle choices, including: Daily care of your teeth and gums. Regular physical activity. Eating a healthy diet. Avoiding tobacco and drug use. Limiting alcohol use. Practicing safe sex. Taking low doses of aspirin every day. Taking vitamin and mineral supplements as recommended by your health care provider. What happens during an annual well check? The services and screenings done by your health care provider during your annual well check will depend on your age, overall health, lifestyle risk factors, and family history of disease. Counseling  Your health care provider may ask you questions about your: Alcohol use. Tobacco use. Drug use. Emotional well-being. Home and relationship well-being. Sexual activity. Eating habits. History of falls. Memory and ability to understand (cognition). Work and work  Astronomer. Screening  You may have the following tests or measurements: Height, weight, and BMI. Blood pressure. Lipid and cholesterol levels. These may be checked every 5 years, or more frequently if you are over 62 years old. Skin check. Lung cancer screening. You may have this screening every year starting at age 84 if you have a 30-pack-year history of smoking and currently smoke or have quit within the past 15 years. Fecal occult blood test (FOBT) of the stool. You may have this test every year starting at age 84. Flexible sigmoidoscopy or colonoscopy. You may have a sigmoidoscopy every 5 years or a colonoscopy every 10 years starting at age 84. Prostate cancer screening. Recommendations will vary depending on your family history and other risks. Hepatitis C blood test. Hepatitis B blood test. Sexually transmitted disease (STD) testing. Diabetes screening. This is done by checking your blood sugar (glucose) after you have not eaten for a while (fasting). You may have this done every 1-3 years. Abdominal aortic aneurysm (AAA) screening. You may need this if you are a current or former smoker. Osteoporosis. You may be screened starting at age 84 if you are at high risk. Talk with your health care provider about your test results, treatment options, and if necessary, the need for more tests. Vaccines  Your health care provider may recommend certain vaccines, such as: Influenza vaccine. This is recommended every year. Tetanus, diphtheria, and acellular pertussis (Tdap, Td) vaccine. You may need a Td booster every 10 years. Zoster vaccine. You may need this after age 61. Pneumococcal 13-valent conjugate (PCV13) vaccine. One dose is recommended after age 45. Pneumococcal polysaccharide (PPSV23) vaccine. One dose is recommended after age 48. Talk to your health care provider about which screenings and vaccines you need and how often  you need them. This information is not intended to replace  advice given to you by your health care provider. Make sure you discuss any questions you have with your health care provider. Document Released: 05/29/2015 Document Revised: 01/20/2016 Document Reviewed: 03/03/2015 Elsevier Interactive Patient Education  2017 [place] Prevention in the Home Falls can cause injuries. They can happen to people of all ages. There are many things you can do to make your home safe and to help prevent falls. What can I do on the outside of my home? Regularly fix the edges of walkways and driveways and fix any cracks. Remove anything that might make you trip as you walk through a door, such as a raised step or threshold. Trim any bushes or trees on the path to your home. Use bright outdoor lighting. Clear any walking paths of anything that might make someone trip, such as rocks or tools. Regularly check to see if handrails are loose or broken. Make sure that both sides of any steps have handrails. Any raised decks and porches should have guardrails on the edges. Have any leaves, snow, or ice cleared regularly. Use sand or salt on walking paths during winter. Clean up any spills in your garage right away. This includes oil or grease spills. What can I do in the bathroom? Use night lights. Install grab bars by the toilet and in the tub and shower. Do not use towel bars as grab bars. Use non-skid mats or decals in the tub or shower. If you need to sit down in the shower, use a plastic, non-slip stool. Keep the floor dry. Clean up any water that spills on the floor as soon as it happens. Remove soap buildup in the tub or shower regularly. Attach bath mats securely with double-sided non-slip rug tape. Do not have throw rugs and other things on the floor that can make you trip. What can I do in the bedroom? Use night lights. Make sure that you have a light by your bed that is easy to reach. Do not use any sheets or blankets that are too big for your bed.  They should not hang down onto the floor. Have a firm chair that has side arms. You can use this for support while you get dressed. Do not have throw rugs and other things on the floor that can make you trip. What can I do in the kitchen? Clean up any spills right away. Avoid walking on wet floors. Keep items that you use a lot in easy-to-reach places. If you need to reach something above you, use a strong step stool that has a grab bar. Keep electrical cords out of the way. Do not use floor polish or wax that makes floors slippery. If you must use wax, use non-skid floor wax. Do not have throw rugs and other things on the floor that can make you trip. What can I do with my stairs? Do not leave any items on the stairs. Make sure that there are handrails on both sides of the stairs and use them. Fix handrails that are broken or loose. Make sure that handrails are as long as the stairways. Check any carpeting to make sure that it is firmly attached to the stairs. Fix any carpet that is loose or worn. Avoid having throw rugs at the top or bottom of the stairs. If you do have throw rugs, attach them to the floor with carpet tape. Make sure that you have a light  switch at the top of the stairs and the bottom of the stairs. If you do not have them, ask someone to add them for you. What else can I do to help prevent falls? Wear shoes that: Do not have high heels. Have rubber bottoms. Are comfortable and fit you well. Are closed at the toe. Do not wear sandals. If you use a stepladder: Make sure that it is fully opened. Do not climb a closed stepladder. Make sure that both sides of the stepladder are locked into place. Ask someone to hold it for you, if possible. Clearly mark and make sure that you can see: Any grab bars or handrails. First and last steps. Where the edge of each step is. Use tools that help you move around (mobility aids) if they are needed. These  include: Canes. Walkers. Scooters. Crutches. Turn on the lights when you go into a dark area. Replace any light bulbs as soon as they burn out. Set up your furniture so you have a clear path. Avoid moving your furniture around. If any of your floors are uneven, fix them. If there are any pets around you, be aware of where they are. Review your medicines with your doctor. Some medicines can make you feel dizzy. This can increase your chance of falling. Ask your doctor what other things that you can do to help prevent falls. This information is not intended to replace advice given to you by your health care provider. Make sure you discuss any questions you have with your health care provider. Document Released: 02/26/2009 Document Revised: 10/08/2015 Document Reviewed: 06/06/2014 Elsevier Interactive Patient Education  2017 [employer].

## 2021-04-05 NOTE — Progress Notes (Signed)
Subjective:   Connor Dixon is a 86 y.o. male who presents for an Subsequent Medicare Annual Wellness Visit.  I connected with Mardelle Matte today by telephone and verified that I am speaking with the correct person using two identifiers. Location patient: home Location provider: work Persons participating in the virtual visit: patient, provider.   I discussed the limitations, risks, security and privacy concerns of performing an evaluation and management service by telephone and the availability of in person appointments. I also discussed with the patient that there may be a patient responsible charge related to this service. The patient expressed understanding and verbally consented to this telephonic visit.    Interactive audio and video telecommunications were attempted between this provider and patient, however failed, due to patient having technical difficulties OR patient did not have access to video capability.  We continued and completed visit with audio only.    Review of Systems     Cardiac Risk Factors include: advanced age (>43mn, >>70women);diabetes mellitus;dyslipidemia;hypertension;male gender     Objective:    Today's Vitals   There is no height or weight on file to calculate BMI.  Advanced Directives 04/05/2021 01/16/2020 01/11/2019 01/09/2018 01/28/2016 04/21/2015  Does Patient Have a Medical Advance Directive? _0  Yes  Type of AParamedicof A[place]almanLiving will Living will;Healthcare Power of AMadisonLiving will HLucienLiving will HNorthamptonLiving will -  Does patient want to make changes to medical advance directive? Yes (ED - Information included in AVS) No - Patient declined - - No - Patient declined -  Copy of [place]in Chart? No - copy requested No - copy requested No - copy requested No - copy requested Yes -    Current  Medications (verified) Outpatient Encounter Medications as of 04/05/2021  Medication Sig   ACCU-CHEK GUIDE test strip USE TO CHECK FOUR TIMES DAILY   allopurinol (ZYLOPRIM) 100 MG tablet TAKE 1 TABLET(100 MG) BY MOUTH DAILY   amoxicillin (AMOXIL) 500 MG capsule TAKE ONE CAPSULE BY MOUTH THREE TIMES DAILY. TAKE ONLY WHEN HAVING DENTAL PROCEDURES   B-D ULTRAFINE III SHORT PEN 31G X 8 MM MISC USE TO ADMINISTER INSULIN THREE TIMES DAILY   BD INSULIN SYRINGE U/F 31G X 5/16" 0.5 ML MISC USE AS DIRECTED FOUR TIMES DAILY   blood glucose meter kit and supplies KIT Use to check blood sugar daily. DX: E11.9   Blood Glucose Monitoring Suppl (ONETOUCH VERIO) w/Device KIT 1 Device by Does not apply route daily. Use as directed   calcitRIOL (ROCALTROL) 0.25 MCG capsule Take 1 capsule (0.25 mcg total) by mouth every other day.   carvedilol (COREG) 6.25 MG tablet TAKE 1 TABLET(6.25 MG) BY MOUTH TWICE DAILY WITH A MEAL   ELIQUIS 2.5 MG TABS tablet TAKE 1 TABLET BY MOUTH TWICE DAILY   furosemide (LASIX) 40 MG tablet Take 2 tablets in the morning and 1 tablet in the afternoon.   Glucagon (GVOKE HYPOPEN 2-PACK) 1 MG/0.2ML SOAJ Inject 1 Act into the skin daily as needed.   insulin isophane & regular human KwikPen (HUMULIN 70/30 MIX) (70-30) 100 UNIT/ML KwikPen 35 units QAM 20 units QPM   insulin NPH-regular Human (HUMULIN 70/30) (70-30) 100 UNIT/ML injection ADMINISTER 65 UNITS UNDER THE SKIN DAILY WITH BREAKFAST   Lancets 33G MISC Use to test blood sugar daily. DX: E11.9   loratadine (CLARITIN) 10 MG tablet Take 10 mg by mouth daily  as needed for allergies.   losartan (COZAAR) 25 MG tablet TAKE 1 TABLET(25 MG) BY MOUTH DAILY   pravastatin (PRAVACHOL) 40 MG tablet TAKE 1 TABLET(40 MG) BY MOUTH DAILY   No facility-administered encounter medications on file as of 04/05/2021.    Allergies (verified) Oysters [shellfish allergy], Farxiga [dapagliflozin], and Apple   History: Past Medical History:  Diagnosis Date    Acute on chronic diastolic congestive heart failure (HCC)    Arthus phenomenon    Atrioventricular block, complete (HCC)    Cardiac pacemaker in situ 07/22/2010   Chronic kidney disease, stage 3 ([place])    Presumably from longstanding DM & HTN. Scr has gradually increased over the past 2 years (1.6 in 12/2010, 2 in 03/2011, and up to 2.1 in 01/2013)   DIABETES MELLITUS, TYPE I, ADULT ONSET 08/06/2007   DM (diabetes mellitus) ([place])    adult onset    Essential hypertension, benign 08/06/2007   Very good BP control   History of second degree heart block    HLD (hyperlipidemia)    HYPERLIPIDEMIA 08/06/2007   Hypopotassemia    Morbid obesity ([place])    Obesity    Other and unspecified angina pectoris    typical   Type I (juvenile type) diabetes mellitus without mention of complication, not stated as uncontrolled    (04/25/13 OV with Dr. Marval Regal) = Poorly controlled & is working  w/Dr. Crissie Sickles to improve this. Most recent Hgb A1c was 8.2% but Mr. Gaskill reports it was down to 7.6 last month.   Past Surgical History:  Procedure Laterality Date   CATARACT EXTRACTION  8/08   [place]   PPM GENERATOR CHANGEOUT N/A 02/13/2019   Procedure: PPM GENERATOR CHANGEOUT;  Surgeon: Evans Lance, MD;  Location: [facility];  Service: Cardiovascular;  Laterality: N/A;   PTVDP  12/11   PPM - [employer] [facility]   TOTAL HIP ARTHROPLASTY  04/05/06   right   Family History  Problem Relation Age of Onset   Asthma Father    Coronary artery disease Father    Diabetes Father    Hyperlipidemia Father    Stroke Father 17   Coronary artery disease Mother    Asthma Mother    Heart attack Mother 42   Colon cancer Neg Hx        no definate prostate CA    Social History   Socioeconomic History   Marital status: Widowed    Spouse name: Not on file   Number of children: 2   Years of education: Not on file   Highest education level: Not on file  Occupational History   Occupation: retired  Tobacco Use    Smoking status: Never   Smokeless tobacco: Never  Vaping Use   Vaping Use: Never used  Substance and Sexual Activity   Alcohol use: No   Drug use: No   Sexual activity: Not Currently  Other Topics Concern   Not on file  Social History Narrative   [employer] BA, 2 Master's degrees. [place] - for certification Education specialist.    Retired Tree surgeon.   Active - travels.   Married - 1961; 2 sons ([place]) and 5 grandchildren.        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    Social Determinants of Health   Financial Resource Strain: Low Risk    Difficulty of Paying Living Expenses: Not hard at all  Food Insecurity: No Food Insecurity   Worried About  Running Out of Food in the Last Year: Never true   Ran Out of Food in the Last Year: Never true  Transportation Needs: No Transportation Needs   Lack of Transportation (Medical): No   Lack of Transportation (Non-Medical): No  Physical Activity: Insufficiently Active   Days of Exercise per Week: 3 days   Minutes of Exercise per Session: 30 min  Stress: No Stress Concern Present   Feeling of Stress : Not at all  Social Connections: Moderately Isolated   Frequency of Communication with Friends and Family: Twice a week   Frequency of Social Gatherings with Friends and Family: Twice a week   Attends Religious Services: 1 to 4 times per year   Active Member of [employer] or Organizations: No   Attends Archivist Meetings: Never   Marital Status: Widowed    Tobacco Counseling Counseling given: Not Answered   Clinical Intake:  Pre-visit preparation completed: Yes  Pain : No/denies pain     Nutritional Risks: None Diabetes: No  How often do you need to have someone help you when you read instructions, pamphlets, or other written materials from your doctor or pharmacy?: 1 - Never What is the last grade level you completed in school?: eds  Diabetic?yes Nutrition Risk Assessment:  Has  the patient had any N/V/D within the last 2 months?  No  Does the patient have any non-healing wounds?  No  Has the patient had any unintentional weight loss or weight gain?  No   Diabetes:  Is the patient diabetic?  Yes  If diabetic, was a CBG obtained today?  No  Did the patient bring in their glucometer from home?  No  How often do you monitor your CBG's? 4 x day .   Financial Strains and Diabetes Management:  Are you having any financial strains with the device, your supplies or your medication? No .  Does the patient want to be seen by Chronic Care Management for management of their diabetes?  No  Would the patient like to be referred to a Nutritionist or for Diabetic Management?  No   Diabetic Exams:  Diabetic Eye Exam: Completed 11/2020 Diabetic Foot Exam: Overdue, Pt has been advised about the importance in completing this exam. Pt is scheduled for diabetic foot exam on next office visit .   Interpreter Needed?: No  Information entered by :: [place] of Daily Living In your present state of health, do you have any difficulty performing the following activities: 04/05/2021  Hearing? N  Vision? N  Difficulty concentrating or making decisions? N  Walking or climbing stairs? N  Dressing or bathing? N  Doing errands, shopping? N  Preparing Food and eating ? N  Using the Toilet? N  In the past six months, have you accidently leaked urine? N  Do you have problems with loss of bowel control? N  Managing your Medications? N  Managing your Finances? N  Housekeeping or managing your Housekeeping? N  Some recent data might be hidden    Patient Care Team: Janith Lima, MD as PCP - General (Internal Medicine) Evans Lance, MD (Cardiology) Elsie Saas, MD (Orthopedic Surgery) Shon Hough, MD (Ophthalmology)  Indicate any recent Medical Services you may have received from other than [facility] providers in the past year (date may be  approximate).     Assessment:   This is a routine wellness examination for [place].  Hearing/Vision screen Vision Screening - Comments:: Annual eye exams wears  glasses   Dietary issues and exercise activities discussed: Current Exercise Habits: The patient does not participate in regular exercise at present, Exercise limited by: None identified   Goals Addressed             This Visit's Progress    Patient Stated   On track    Increase the amount of time that I work out in my yard and flower garden. Continue to love my family and enjoy life.       Depression Screen PHQ 2/9 Scores 04/05/2021 04/05/2021 01/16/2020 01/11/2019 11/26/2018 01/09/2018 09/07/2017  PHQ - 2 Score 0 0 0 0 0 1 0  PHQ- 9 Score - - - - - 1 0    Fall Risk Fall Risk  04/05/2021 01/16/2020 05/21/2019 01/11/2019 11/26/2018  Falls in the past year? 0 0 0 1 1  Number falls in past yr: 0 0 0 1 1  Injury with Fall? 0 0 0 0 0  Risk for fall due to : - No Fall Risks Impaired balance/gait Impaired balance/gait;Impaired mobility Impaired balance/gait;Impaired mobility  Follow up Falls evaluation completed Falls evaluation completed Falls evaluation completed Falls prevention discussed Falls evaluation completed;Education provided    FALL RISK PREVENTION PERTAINING TO THE HOME:  Any stairs in or around the home? Yes  If so, are there any without handrails? No  Home free of loose throw rugs in walkways, pet beds, electrical cords, etc? Yes  Adequate lighting in your home to reduce risk of falls? Yes   ASSISTIVE DEVICES UTILIZED TO PREVENT FALLS:  Life alert? No  Use of a cane, walker or w/c? No  Grab bars in the bathroom? Yes  Shower chair or bench in shower? Yes  Elevated toilet seat or a handicapped toilet? No    Cognitive Function: Normal cognitive status assessed by direct observation by this Nurse Health Advisor. No abnormalities found.   MMSE - Mini Mental State Exam 01/09/2018  Orientation to time 5   Orientation to Place 5  Registration 3  Attention/ Calculation 5  Recall 2  Language- name 2 objects 2  Language- repeat 1  Language- follow 3 step command 3  Language- read & follow direction 1  Write a sentence 1  Copy design 1  Total score 29     6CIT Screen 01/16/2020  What Year? 0 points  What month? 0 points  What time? 0 points  Count back from 20 0 points  Months in reverse 0 points  Repeat phrase 0 points  Total Score 0    Immunizations Immunization History  Administered Date(s) Administered   Fluad Quad(high Dose 65+) 01/11/2019, 02/18/2020   H1N1 06/05/2008   Influenza Split 02/22/2011   Influenza Whole 02/04/2008, 02/24/2009, 02/05/2010   Influenza, High Dose Seasonal PF 02/20/2015, 01/27/2016, 01/31/2017, 02/07/2018   Influenza,inj,Quad PF,6+ Mos 01/15/2013   Moderna Sars-Covid-2 Vaccination 05/30/2019, 06/27/2019   Pneumococcal Conjugate-13 04/02/2013   Pneumococcal Polysaccharide-23 01/09/2012, 06/09/2020   Td 01/09/2012   Zoster Recombinat (Shingrix) 08/16/2017, 10/17/2017   Zoster, Live 02/28/2014    TDAP status: Up to date  Flu Vaccine status: Up to date  Pneumococcal vaccine status: Up to date  Covid-19 vaccine status: Completed vaccines  Qualifies for Shingles Vaccine? Yes   Zostavax completed No   Shingrix Completed?: Yes  Screening Tests Health Maintenance  Topic Date Due   COVID-19 Vaccine (3 - Booster for Moderna series) 08/22/2019   INFLUENZA VACCINE  12/14/2020   FOOT EXAM  06/09/2021   OPHTHALMOLOGY EXAM  09/14/2021   TETANUS/TDAP  01/08/2022   Pneumonia Vaccine 24+ Years old  Completed   Zoster Vaccines- Shingrix  Completed   HPV VACCINES  Aged Out    Health Maintenance  Health Maintenance Due  Topic Date Due   COVID-19 Vaccine (3 - Booster for Moderna series) 08/22/2019   INFLUENZA VACCINE  12/14/2020    Colorectal cancer screening: No longer required.   Lung Cancer Screening: (Low Dose CT Chest recommended if Age  35-80 years, 30 pack-year currently smoking OR have quit w/in 15years.) does not qualify.   Lung Cancer Screening Referral: n/a  Additional Screening:  Hepatitis C Screening: does not qualify;   Vision Screening: Recommended annual ophthalmology exams for early detection of glaucoma and other disorders of the eye. Is the patient up to date with their annual eye exam?  Yes  Who is the provider or what is the name of the office in which the patient attends annual eye exams? Dr.Motinger  If pt is not established with a provider, would they like to be referred to a provider to establish care? No .   Dental Screening: Recommended annual dental exams for proper oral hygiene  Community Resource Referral / Chronic Care Management: CRR required this visit?  No   CCM required this visit?  No      Plan:     I have personally reviewed and noted the following in the patient's chart:   Medical and social history Use of alcohol, tobacco or illicit drugs  Current medications and supplements including opioid prescriptions. Patient is not currently taking opioid prescriptions. Functional ability and status Nutritional status Physical activity Advanced directives List of other physicians Hospitalizations, surgeries, and ER visits in previous 12 months Vitals Screenings to include cognitive, depression, and falls Referrals and appointments  In addition, I have reviewed and discussed with patient certain preventive protocols, quality metrics, and best practice recommendations. A written personalized care plan for preventive services as well as general preventive health recommendations were provided to patient.     Randel Pigg, LPN   [id number]   Nurse Notes: none

## 2021-04-12 DIAGNOSIS — N1831 Chronic kidney disease, stage 3a: Secondary | ICD-10-CM | POA: Diagnosis not present

## 2021-04-12 DIAGNOSIS — N189 Chronic kidney disease, unspecified: Secondary | ICD-10-CM | POA: Diagnosis not present

## 2021-05-05 ENCOUNTER — Other Ambulatory Visit: Payer: Self-pay | Admitting: Internal Medicine

## 2021-05-05 DIAGNOSIS — M1A3790 Chronic gout due to renal impairment, unspecified ankle and foot, without tophus (tophi): Secondary | ICD-10-CM

## 2021-05-05 DIAGNOSIS — M10071 Idiopathic gout, right ankle and foot: Secondary | ICD-10-CM

## 2021-05-21 ENCOUNTER — Ambulatory Visit (INDEPENDENT_AMBULATORY_CARE_PROVIDER_SITE_OTHER): Payer: Medicare PPO

## 2021-05-21 DIAGNOSIS — I442 Atrioventricular block, complete: Secondary | ICD-10-CM

## 2021-05-21 LAB — CUP PACEART REMOTE DEVICE CHECK
Battery Remaining Longevity: 71 mo
Battery Remaining Percentage: 74 %
Battery Voltage: 2.99 V
Brady Statistic AP VP Percent: 1.1 %
Brady Statistic AP VS Percent: 1 %
Brady Statistic AS VP Percent: 98 %
Brady Statistic AS VS Percent: 1 %
Brady Statistic RA Percent Paced: 1 %
Brady Statistic RV Percent Paced: 99 %
Date Time Interrogation Session: 20230106040024
Implantable Lead Implant Date: 20111202
Implantable Lead Implant Date: 20111202
Implantable Lead Location: 753859
Implantable Lead Location: 753860
Implantable Pulse Generator Implant Date: 20200930
Lead Channel Impedance Value: 390 Ohm
Lead Channel Impedance Value: 450 Ohm
Lead Channel Pacing Threshold Amplitude: 0.75 V
Lead Channel Pacing Threshold Amplitude: 2 V
Lead Channel Pacing Threshold Pulse Width: 0.4 ms
Lead Channel Pacing Threshold Pulse Width: 0.8 ms
Lead Channel Sensing Intrinsic Amplitude: 1.6 mV
Lead Channel Sensing Intrinsic Amplitude: 7.4 mV
Lead Channel Setting Pacing Amplitude: 2 V
Lead Channel Setting Pacing Amplitude: 2.25 V
Lead Channel Setting Pacing Pulse Width: 0.8 ms
Lead Channel Setting Sensing Sensitivity: 4 mV
Pulse Gen Model: 2272
Pulse Gen Serial Number: 9156495

## 2021-05-22 ENCOUNTER — Other Ambulatory Visit: Payer: Self-pay | Admitting: Internal Medicine

## 2021-05-22 DIAGNOSIS — E1122 Type 2 diabetes mellitus with diabetic chronic kidney disease: Secondary | ICD-10-CM

## 2021-05-24 ENCOUNTER — Other Ambulatory Visit: Payer: Self-pay | Admitting: Internal Medicine

## 2021-05-24 DIAGNOSIS — E0840 Diabetes mellitus due to underlying condition with diabetic neuropathy, unspecified: Secondary | ICD-10-CM

## 2021-05-24 DIAGNOSIS — E0821 Diabetes mellitus due to underlying condition with diabetic nephropathy: Secondary | ICD-10-CM

## 2021-05-24 DIAGNOSIS — Z794 Long term (current) use of insulin: Secondary | ICD-10-CM

## 2021-05-31 ENCOUNTER — Other Ambulatory Visit: Payer: Self-pay | Admitting: Internal Medicine

## 2021-05-31 DIAGNOSIS — E559 Vitamin D deficiency, unspecified: Secondary | ICD-10-CM

## 2021-05-31 NOTE — Progress Notes (Signed)
Remote pacemaker transmission.   

## 2021-06-04 ENCOUNTER — Other Ambulatory Visit: Payer: Self-pay | Admitting: Internal Medicine

## 2021-06-04 DIAGNOSIS — I10 Essential (primary) hypertension: Secondary | ICD-10-CM

## 2021-06-04 DIAGNOSIS — E785 Hyperlipidemia, unspecified: Secondary | ICD-10-CM

## 2021-06-09 ENCOUNTER — Encounter: Payer: Self-pay | Admitting: Internal Medicine

## 2021-06-09 ENCOUNTER — Ambulatory Visit: Payer: Medicare PPO | Admitting: Internal Medicine

## 2021-06-09 ENCOUNTER — Other Ambulatory Visit: Payer: Self-pay

## 2021-06-09 VITALS — BP 126/74 | HR 74 | Temp 97.8°F | Resp 16 | Ht 68.0 in | Wt 238.0 lb

## 2021-06-09 DIAGNOSIS — E559 Vitamin D deficiency, unspecified: Secondary | ICD-10-CM | POA: Diagnosis not present

## 2021-06-09 DIAGNOSIS — E1122 Type 2 diabetes mellitus with diabetic chronic kidney disease: Secondary | ICD-10-CM | POA: Diagnosis not present

## 2021-06-09 DIAGNOSIS — E119 Type 2 diabetes mellitus without complications: Secondary | ICD-10-CM | POA: Insufficient documentation

## 2021-06-09 DIAGNOSIS — M10071 Idiopathic gout, right ankle and foot: Secondary | ICD-10-CM

## 2021-06-09 DIAGNOSIS — N1832 Chronic kidney disease, stage 3b: Secondary | ICD-10-CM

## 2021-06-09 DIAGNOSIS — E785 Hyperlipidemia, unspecified: Secondary | ICD-10-CM | POA: Diagnosis not present

## 2021-06-09 DIAGNOSIS — I10 Essential (primary) hypertension: Secondary | ICD-10-CM | POA: Diagnosis not present

## 2021-06-09 DIAGNOSIS — E0821 Diabetes mellitus due to underlying condition with diabetic nephropathy: Secondary | ICD-10-CM | POA: Diagnosis not present

## 2021-06-09 DIAGNOSIS — I129 Hypertensive chronic kidney disease with stage 1 through stage 4 chronic kidney disease, or unspecified chronic kidney disease: Secondary | ICD-10-CM | POA: Diagnosis not present

## 2021-06-09 DIAGNOSIS — E1140 Type 2 diabetes mellitus with diabetic neuropathy, unspecified: Secondary | ICD-10-CM | POA: Diagnosis not present

## 2021-06-09 DIAGNOSIS — Z0001 Encounter for general adult medical examination with abnormal findings: Secondary | ICD-10-CM

## 2021-06-09 DIAGNOSIS — E1121 Type 2 diabetes mellitus with diabetic nephropathy: Secondary | ICD-10-CM

## 2021-06-09 DIAGNOSIS — Z794 Long term (current) use of insulin: Secondary | ICD-10-CM | POA: Diagnosis not present

## 2021-06-09 DIAGNOSIS — E0840 Diabetes mellitus due to underlying condition with diabetic neuropathy, unspecified: Secondary | ICD-10-CM | POA: Diagnosis not present

## 2021-06-09 LAB — URINALYSIS, ROUTINE W REFLEX MICROSCOPIC
Bilirubin Urine: NEGATIVE
Hgb urine dipstick: NEGATIVE
Ketones, ur: NEGATIVE
Leukocytes,Ua: NEGATIVE
Nitrite: NEGATIVE
Specific Gravity, Urine: 1.015 (ref 1.000–1.030)
Total Protein, Urine: 100 — AB
Urine Glucose: 100 — AB
Urobilinogen, UA: 1 (ref 0.0–1.0)
pH: 6 (ref 5.0–8.0)

## 2021-06-09 LAB — VITAMIN D 25 HYDROXY (VIT D DEFICIENCY, FRACTURES): VITD: 29.63 ng/mL — ABNORMAL LOW (ref 30.00–100.00)

## 2021-06-09 LAB — BASIC METABOLIC PANEL
BUN: 36 mg/dL — ABNORMAL HIGH (ref 6–23)
CO2: 32 mEq/L (ref 19–32)
Calcium: 9.6 mg/dL (ref 8.4–10.5)
Chloride: 101 mEq/L (ref 96–112)
Creatinine, Ser: 1.97 mg/dL — ABNORMAL HIGH (ref 0.40–1.50)
GFR: 30.66 mL/min — ABNORMAL LOW (ref >60.00)
Glucose, Bld: 183 mg/dL — ABNORMAL HIGH (ref 70–99)
Potassium: 4 mEq/L (ref 3.5–5.1)
Sodium: 140 mEq/L (ref 135–145)

## 2021-06-09 LAB — TSH: TSH: 1.78 u[IU]/mL (ref 0.35–5.50)

## 2021-06-09 LAB — CBC WITH DIFFERENTIAL/PLATELET
Basophils Absolute: 0 10*3/uL (ref 0.0–0.1)
Basophils Relative: 0.4 % (ref 0.0–3.0)
Eosinophils Absolute: 0.2 10*3/uL (ref 0.0–0.7)
Eosinophils Relative: 3.1 % (ref 0.0–5.0)
HCT: 44.9 % (ref 39.0–52.0)
Hemoglobin: 14.4 g/dL (ref 13.0–17.0)
Lymphocytes Relative: 18 % (ref 12.0–46.0)
Lymphs Abs: 1.2 10*3/uL (ref 0.7–4.0)
MCHC: 32.1 g/dL (ref 30.0–36.0)
MCV: 94.7 fl (ref 78.0–100.0)
Monocytes Absolute: 0.7 10*3/uL (ref 0.1–1.0)
Monocytes Relative: 9.8 % (ref 3.0–12.0)
Neutro Abs: 4.7 10*3/uL (ref 1.4–7.7)
Neutrophils Relative %: 68.7 % (ref 43.0–77.0)
Platelets: 184 10*3/uL (ref 150.0–400.0)
RBC: 4.74 Mil/uL (ref 4.22–5.81)
RDW: 14.1 % (ref 11.5–15.5)
WBC: 6.9 10*3/uL (ref 4.0–10.5)

## 2021-06-09 LAB — HEPATIC FUNCTION PANEL
ALT: 13 U/L (ref 0–53)
AST: 22 U/L (ref 0–37)
Albumin: 3.8 g/dL (ref 3.5–5.2)
Alkaline Phosphatase: 88 U/L (ref 39–117)
Bilirubin, Direct: 0.2 mg/dL (ref 0.0–0.3)
Total Bilirubin: 1 mg/dL (ref 0.2–1.2)
Total Protein: 7.2 g/dL (ref 6.0–8.3)

## 2021-06-09 LAB — URIC ACID: Uric Acid, Serum: 6.2 mg/dL (ref 4.0–7.8)

## 2021-06-09 LAB — MICROALBUMIN / CREATININE URINE RATIO
Creatinine,U: 87.3 mg/dL
Microalb Creat Ratio: 58.1 mg/g — ABNORMAL HIGH (ref 0.0–30.0)
Microalb, Ur: 50.7 mg/dL — ABNORMAL HIGH (ref 0.0–1.9)

## 2021-06-09 LAB — HEMOGLOBIN A1C: Hgb A1c MFr Bld: 9.1 % — ABNORMAL HIGH (ref 4.6–6.5)

## 2021-06-09 MED ORDER — FREESTYLE LIBRE 2 SENSOR MISC: 1.0000 | Freq: Every day | 5 refills | Status: DC

## 2021-06-09 MED ORDER — CALCITRIOL 0.25 MCG PO CAPS: 0.2500 ug | ORAL_CAPSULE | ORAL | 1 refills | Status: DC

## 2021-06-09 MED ORDER — KERENDIA 10 MG PO TABS: 1.0000 | ORAL_TABLET | Freq: Every day | ORAL | 0 refills | Status: DC

## 2021-06-09 MED ORDER — FREESTYLE LIBRE 2 READER DEVI: 1.0000 | Freq: Every day | 5 refills | Status: DC

## 2021-06-09 NOTE — Progress Notes (Signed)
Subjective:  Patient ID: Connor Dixon, male    DOB: 12-12-1934  Age: 87 y.o. MRN: [id number]  CC: Annual Exam and Diabetes  This visit occurred during the SARS-CoV-2 public health emergency.  Safety protocols were in place, including screening questions prior to the visit, additional usage of staff PPE, and extensive cleaning of exam room while observing appropriate contact time as indicated for disinfecting solutions.    HPI Connor Dixon presents for a CPX and f/up -  He has chronic, unchanged shortness of breath.  He has intentionally lost weight with lifestyle modifications.  He denies chest pain, diaphoresis, edema, or polys.  Outpatient Medications Prior to Visit  Medication Sig Dispense Refill   ACCU-CHEK GUIDE test strip USE FOUR TIMES DAILY 200 strip 5   allopurinol (ZYLOPRIM) 100 MG tablet TAKE 1 TABLET(100 MG) BY MOUTH DAILY 90 tablet 0   amoxicillin (AMOXIL) 500 MG capsule TAKE ONE CAPSULE BY MOUTH THREE TIMES DAILY. TAKE ONLY WHEN HAVING DENTAL PROCEDURES 30 capsule 2   B-D ULTRAFINE III SHORT PEN 31G X 8 MM MISC USE TO ADMINISTER INSULIN THREE TIMES DAILY 300 each 2   BD INSULIN SYRINGE U/F 31G X 5/16" 0.5 ML MISC USE AS DIRECTED FOUR TIMES DAILY 200 each 2   blood glucose meter kit and supplies KIT Use to check blood sugar daily. DX: E11.9 1 each 0   carvedilol (COREG) 6.25 MG tablet TAKE 1 TABLET(6.25 MG) BY MOUTH TWICE DAILY WITH A MEAL 180 tablet 0   ELIQUIS 2.5 MG TABS tablet TAKE 1 TABLET BY MOUTH TWICE DAILY 60 tablet 5   furosemide (LASIX) 40 MG tablet Take 2 tablets in the morning and 1 tablet in the afternoon. 270 tablet 3   Glucagon (GVOKE HYPOPEN 2-PACK) 1 MG/0.2ML SOAJ Inject 1 Act into the skin daily as needed. 2 mL 5   Lancets 33G MISC Use to test blood sugar daily. DX: E11.9 100 each 3   loratadine (CLARITIN) 10 MG tablet Take 10 mg by mouth daily as needed for allergies.     losartan (COZAAR) 25 MG tablet TAKE 1 TABLET(25 MG) BY MOUTH DAILY 90 tablet 1    pravastatin (PRAVACHOL) 40 MG tablet TAKE 1 TABLET(40 MG) BY MOUTH DAILY 90 tablet 0   Blood Glucose Monitoring Suppl (ONETOUCH VERIO) w/Device KIT 1 Device by Does not apply route daily. Use as directed 1 kit 0   calcitRIOL (ROCALTROL) 0.25 MCG capsule Take 1 capsule (0.25 mcg total) by mouth every other day. 45 capsule 1   HUMULIN 70/30 (70-30) 100 UNIT/ML injection ADMINISTER 65 UNITS UNDER THE SKIN DAILY WITH BREAKFAST 20 mL 3   insulin isophane & regular human KwikPen (HUMULIN 70/30 MIX) (70-30) 100 UNIT/ML KwikPen 35 units QAM 20 units QPM 3 mL 0   No facility-administered medications prior to visit.    ROS Review of Systems  Constitutional:  Negative for chills, diaphoresis, fatigue and unexpected weight change.  HENT: Negative.    Eyes: Negative.   Respiratory:  Negative for cough, chest tightness, shortness of breath and wheezing.   Cardiovascular:  Negative for chest pain, palpitations and leg swelling.  Gastrointestinal:  Negative for abdominal pain, diarrhea, nausea and vomiting.  Genitourinary: Negative.  Negative for difficulty urinating.  Musculoskeletal: Negative.  Negative for myalgias.  Skin: Negative.  Negative for color change.  Neurological:  Negative for dizziness, weakness, light-headedness and headaches.  Hematological:  Negative for adenopathy. Does not bruise/bleed easily.  Psychiatric/Behavioral: Negative.  Objective:  °BP 126/74 (BP Location: Left Arm, Patient Position: Sitting, Cuff Size: Large)    Pulse 74    Temp 97.8 °F (36.6 °C) (Oral)    Resp 16    Ht 5' 8" (1.727 m)    Wt 238 lb (108 kg)    SpO2 94%    BMI 36.19 kg/m²  ° °BP Readings from Last 3 Encounters:  °06/09/21 126/74  °12/09/20 126/80  °07/03/20 136/84  ° ° °Wt Readings from Last 3 Encounters:  °06/09/21 238 lb (108 kg)  °12/09/20 247 lb (112 kg)  °07/03/20 250 lb (113.4 kg)  ° ° °Physical Exam °Vitals reviewed.  °Constitutional:   °   Appearance: Normal appearance.  °HENT:  °   Nose: Nose  normal.  °   Mouth/Throat:  °   Mouth: Mucous membranes are moist.  °Eyes:  °   General: No scleral icterus. °   Conjunctiva/sclera: Conjunctivae normal.  °Cardiovascular:  °   Rate and Rhythm: Normal rate and regular rhythm.  °   Heart sounds: S1 normal and S2 normal. Murmur heard.  °Systolic murmur is present with a grade of 1/6.  °  No gallop.  °Pulmonary:  °   Effort: Pulmonary effort is normal.  °   Breath sounds: No stridor. No wheezing, rhonchi or rales.  °Abdominal:  °   General: Abdomen is flat.  °   Palpations: There is no mass.  °   Tenderness: There is no abdominal tenderness. There is no guarding.  °   Hernia: No hernia is present.  °Musculoskeletal:     °   General: Normal range of motion.  °   Cervical back: Neck supple. No tenderness.  °   Right lower leg: No edema.  °   Left lower leg: No edema.  °Lymphadenopathy:  °   Cervical: No cervical adenopathy.  °Skin: °   General: Skin is warm and dry.  °Neurological:  °   General: No focal deficit present.  °   Mental Status: He is alert and oriented to person, place, and time.  °Psychiatric:     °   Mood and Affect: Mood normal.     °   Behavior: Behavior normal.  ° ° °Lab Results  °Component Value Date  ° WBC 6.9 06/09/2021  ° HGB 14.4 06/09/2021  ° HCT 44.9 06/09/2021  ° PLT 184.0 06/09/2021  ° GLUCOSE 183 (H) 06/09/2021  ° CHOL 143 12/09/2020  ° TRIG 78.0 12/09/2020  ° HDL 49.20 12/09/2020  ° LDLCALC 78 12/09/2020  ° ALT 13 06/09/2021  ° AST 22 06/09/2021  ° NA 140 06/09/2021  ° K 4.0 06/09/2021  ° CL 101 06/09/2021  ° CREATININE 1.97 (H) 06/09/2021  ° BUN 36 (H) 06/09/2021  ° CO2 32 06/09/2021  ° TSH 1.78 06/09/2021  ° PSA 1.77 01/06/2010  ° HGBA1C 9.1 (H) 06/09/2021  ° MICROALBUR 50.7 (H) 06/09/2021  ° ° °EP PPM/ICD IMPLANT ° °Result Date: 02/13/2019 °Conclusion: Successful dual-chamber pacemaker generator removal and insertion of a new dual-chamber pacemaker in a patient with complete heart block who had reached elective replacement. Connor Taylor,  MD ° ° °Assessment & Plan:  ° °Connor Dixon was seen today for annual exam and diabetes. ° °Diagnoses and all orders for this visit: ° °Diabetes mellitus due to underlying condition with diabetic neuropathy, without long-term current use of insulin (HCC)- His A1c is up to 9.1%.  Will increase the dose of Humulin 70/30. °-       Microalbumin / creatinine urine ratio; Future °-     Hemoglobin A1c; Future °-     HM Diabetes Foot Exam °-     Hemoglobin A1c °-     Microalbumin / creatinine urine ratio °-     Finerenone (KERENDIA) 10 MG TABS; Take 1 tablet by mouth daily. °-     insulin isophane & regular human KwikPen (HUMULIN 70/30 MIX) (70-30) 100 UNIT/ML KwikPen; 40 units QAM °25 units QPM ° °Idiopathic gout of right foot, unspecified chronicity- His uric acid level is in the normal range. °-     Uric acid; Future °-     Uric acid ° °Stage 3b chronic kidney disease (HCC)- I recommended that he start taking an MRA for renal protection. °-     Basic metabolic panel; Future °-     Microalbumin / creatinine urine ratio; Future °-     Urinalysis, Routine w reflex microscopic; Future °-     VITAMIN D 25 Hydroxy (Vit-D Deficiency, Fractures); Future °-     CBC with Differential/Platelet; Future °-     CBC with Differential/Platelet °-     VITAMIN D 25 Hydroxy (Vit-D Deficiency, Fractures) °-     Urinalysis, Routine w reflex microscopic °-     Microalbumin / creatinine urine ratio °-     Basic metabolic panel °-     Finerenone (KERENDIA) 10 MG TABS; Take 1 tablet by mouth daily. °-     Consult to [facility] Care Management ° °Encounter for general adult medical examination with abnormal findings- Exam completed, labs reviewed, vaccines reviewed and updated, no cancer screenings indicated, patient education was given. ° °Hyperlipidemia with target LDL less than 70- LDL goal achieved. Doing well on the statin  °-     Cancel: Lipid panel; Future °-     TSH; Future °-     Hepatic function panel; Future °-     Hepatic function panel °-      TSH ° °Essential hypertension- His BP is adequately well controlled. °-     Basic metabolic panel; Future °-     CBC with Differential/Platelet; Future °-     CBC with Differential/Platelet °-     Basic metabolic panel ° °Vitamin D deficiency disease °-     calcitRIOL (ROCALTROL) 0.25 MCG capsule; Take 1 capsule (0.25 mcg total) by mouth every other day. ° °Diabetic nephropathy associated with type 2 diabetes mellitus (HCC) °-     Finerenone (KERENDIA) 10 MG TABS; Take 1 tablet by mouth daily. °-     Consult to [facility] Care Management ° °Insulin-requiring or dependent type II diabetes mellitus (HCC) °-     Continuous Blood Gluc Sensor (FREESTYLE LIBRE 2 SENSOR) MISC; 1 Act by Does not apply route daily. °-     Continuous Blood Gluc Receiver (FREESTYLE LIBRE 2 READER) DEVI; 1 Act by Does not apply route daily. °-     Consult to [facility] Care Management °-     insulin isophane & regular human KwikPen (HUMULIN 70/30 MIX) (70-30) 100 UNIT/ML KwikPen; 40 units QAM °25 units QPM ° °Diabetes mellitus due to underlying condition with diabetic nephropathy (HCC) °-     insulin isophane & regular human KwikPen (HUMULIN 70/30 MIX) (70-30) 100 UNIT/ML KwikPen; 40 units QAM °25 units QPM ° ° °I have discontinued Connor Dixon's OneTouch Verio and HumuLIN 70/30. I have also changed his insulin isophane & regular human KwikPen. Additionally, I am having him start on Kerendia, FreeStyle   Libre 2 Sensor, and [employer] 2 Reader. Lastly, I am having him maintain his amoxicillin, loratadine, blood glucose meter kit and supplies, Lancets 33G, furosemide, BD Insulin Syringe U/F, Gvoke HypoPen 2-Pack, B-D ULTRAFINE III SHORT PEN, Eliquis, losartan, allopurinol, Accu-Chek Guide, pravastatin, carvedilol, and calcitRIOL.  Meds ordered this encounter  Medications   calcitRIOL (ROCALTROL) 0.25 MCG capsule    Sig: Take 1 capsule (0.25 mcg total) by mouth every other day.    Dispense:  45 capsule    Refill:  1   Finerenone (KERENDIA) 10 MG  TABS    Sig: Take 1 tablet by mouth daily.    Dispense:  30 tablet    Refill:  0   Continuous Blood Gluc Sensor (FREESTYLE LIBRE 2 SENSOR) MISC    Sig: 1 Act by Does not apply route daily.    Dispense:  2 each    Refill:  5   Continuous Blood Gluc Receiver (FREESTYLE LIBRE 2 READER) DEVI    Sig: 1 Act by Does not apply route daily.    Dispense:  2 each    Refill:  5   insulin isophane & regular human KwikPen (HUMULIN 70/30 MIX) (70-30) 100 UNIT/ML KwikPen    Sig: 40 units QAM 25 units QPM    Dispense:  3 mL    Refill:  0    May dispense a 90 day if insurance will cover.     Follow-up: Return in about 6 months (around 12/07/2021).  Connor Calico, MD

## 2021-06-09 NOTE — Patient Instructions (Signed)

## 2021-06-15 MED ORDER — INSULIN ISOPHANE & REGULAR (HUMAN 70-30)100 UNIT/ML KWIKPEN: PEN_INJECTOR | SUBCUTANEOUS | 0 refills | Status: DC

## 2021-07-09 DIAGNOSIS — E1140 Type 2 diabetes mellitus with diabetic neuropathy, unspecified: Secondary | ICD-10-CM | POA: Diagnosis not present

## 2021-07-09 DIAGNOSIS — E785 Hyperlipidemia, unspecified: Secondary | ICD-10-CM | POA: Diagnosis not present

## 2021-07-09 DIAGNOSIS — D6869 Other thrombophilia: Secondary | ICD-10-CM | POA: Diagnosis not present

## 2021-07-09 DIAGNOSIS — E1136 Type 2 diabetes mellitus with diabetic cataract: Secondary | ICD-10-CM | POA: Diagnosis not present

## 2021-07-09 DIAGNOSIS — I4891 Unspecified atrial fibrillation: Secondary | ICD-10-CM | POA: Diagnosis not present

## 2021-07-09 DIAGNOSIS — E1165 Type 2 diabetes mellitus with hyperglycemia: Secondary | ICD-10-CM | POA: Diagnosis not present

## 2021-07-09 DIAGNOSIS — I10 Essential (primary) hypertension: Secondary | ICD-10-CM | POA: Diagnosis not present

## 2021-07-09 DIAGNOSIS — Z794 Long term (current) use of insulin: Secondary | ICD-10-CM | POA: Diagnosis not present

## 2021-07-12 ENCOUNTER — Telehealth: Payer: Self-pay

## 2021-07-12 MED ORDER — "INSULIN SYRINGE-NEEDLE U-100 31G X 5/16"" 0.5 ML MISC": 1 refills | Status: DC

## 2021-07-12 NOTE — Telephone Encounter (Signed)
Pt requesting a refill on: BD INSULIN SYRINGE U/F 31G X 5/16" 0.5 ML MISC  Pt states that he has used the same one over the weekend.  Pharmacy: [id number] - [place], [place] - [address] AT [facility] OF [address]  Pt CB [phone]

## 2021-08-03 ENCOUNTER — Other Ambulatory Visit: Payer: Self-pay | Admitting: *Deleted

## 2021-08-03 MED ORDER — APIXABAN 2.5 MG PO TABS: 2.5000 mg | ORAL_TABLET | Freq: Two times a day (BID) | ORAL | 1 refills | Status: DC

## 2021-08-03 NOTE — Telephone Encounter (Signed)
Eliquis 2.5mg  paper refill request received. Patient is 87 years old, weight-108kg, Crea-1.97 on 06/09/2021, Diagnosis-Afib, and last seen by Dr. Lovena Le on 07/03/2020 & pending an appt on 09/03/2021. Dose is appropriate based on dosing criteria. Will send in refill to requested pharmacy.   ?

## 2021-08-09 DIAGNOSIS — N1831 Chronic kidney disease, stage 3a: Secondary | ICD-10-CM | POA: Diagnosis not present

## 2021-08-09 DIAGNOSIS — N189 Chronic kidney disease, unspecified: Secondary | ICD-10-CM | POA: Diagnosis not present

## 2021-08-12 DIAGNOSIS — I129 Hypertensive chronic kidney disease with stage 1 through stage 4 chronic kidney disease, or unspecified chronic kidney disease: Secondary | ICD-10-CM | POA: Diagnosis not present

## 2021-08-12 DIAGNOSIS — E1165 Type 2 diabetes mellitus with hyperglycemia: Secondary | ICD-10-CM | POA: Diagnosis not present

## 2021-08-12 DIAGNOSIS — N2581 Secondary hyperparathyroidism of renal origin: Secondary | ICD-10-CM | POA: Diagnosis not present

## 2021-08-12 DIAGNOSIS — I5033 Acute on chronic diastolic (congestive) heart failure: Secondary | ICD-10-CM | POA: Diagnosis not present

## 2021-08-12 DIAGNOSIS — D631 Anemia in chronic kidney disease: Secondary | ICD-10-CM | POA: Diagnosis not present

## 2021-08-12 DIAGNOSIS — E1122 Type 2 diabetes mellitus with diabetic chronic kidney disease: Secondary | ICD-10-CM | POA: Diagnosis not present

## 2021-08-12 DIAGNOSIS — N1831 Chronic kidney disease, stage 3a: Secondary | ICD-10-CM | POA: Diagnosis not present

## 2021-08-12 DIAGNOSIS — E785 Hyperlipidemia, unspecified: Secondary | ICD-10-CM | POA: Diagnosis not present

## 2021-08-20 ENCOUNTER — Ambulatory Visit (INDEPENDENT_AMBULATORY_CARE_PROVIDER_SITE_OTHER): Payer: Medicare PPO

## 2021-08-20 DIAGNOSIS — I442 Atrioventricular block, complete: Secondary | ICD-10-CM | POA: Diagnosis not present

## 2021-08-20 LAB — CUP PACEART REMOTE DEVICE CHECK
Battery Remaining Longevity: 67 mo
Battery Remaining Percentage: 71 %
Battery Voltage: 2.99 V
Brady Statistic AP VP Percent: 1.3 %
Brady Statistic AP VS Percent: 1 %
Brady Statistic AS VP Percent: 98 %
Brady Statistic AS VS Percent: 1 %
Brady Statistic RA Percent Paced: 1 %
Brady Statistic RV Percent Paced: 99 %
Date Time Interrogation Session: 20230407055821
Implantable Lead Implant Date: 20111202
Implantable Lead Implant Date: 20111202
Implantable Lead Location: 753859
Implantable Lead Location: 753860
Implantable Pulse Generator Implant Date: 20200930
Lead Channel Impedance Value: 390 Ohm
Lead Channel Impedance Value: 450 Ohm
Lead Channel Pacing Threshold Amplitude: 0.75 V
Lead Channel Pacing Threshold Amplitude: 2 V
Lead Channel Pacing Threshold Pulse Width: 0.4 ms
Lead Channel Pacing Threshold Pulse Width: 0.8 ms
Lead Channel Sensing Intrinsic Amplitude: 1.7 mV
Lead Channel Sensing Intrinsic Amplitude: 6.5 mV
Lead Channel Setting Pacing Amplitude: 2 V
Lead Channel Setting Pacing Amplitude: 2.25 V
Lead Channel Setting Pacing Pulse Width: 0.8 ms
Lead Channel Setting Sensing Sensitivity: 4 mV
Pulse Gen Model: 2272
Pulse Gen Serial Number: 9156495

## 2021-08-26 ENCOUNTER — Other Ambulatory Visit: Payer: Self-pay | Admitting: Internal Medicine

## 2021-08-26 ENCOUNTER — Telehealth: Payer: Self-pay

## 2021-08-26 DIAGNOSIS — M10071 Idiopathic gout, right ankle and foot: Secondary | ICD-10-CM

## 2021-08-26 DIAGNOSIS — M1A3790 Chronic gout due to renal impairment, unspecified ankle and foot, without tophus (tophi): Secondary | ICD-10-CM

## 2021-08-26 MED ORDER — ALLOPURINOL 100 MG PO TABS: ORAL_TABLET | ORAL | 1 refills | Status: DC

## 2021-08-26 NOTE — Telephone Encounter (Signed)
Pt is requesting a refill on: ?allopurinol (ZYLOPRIM) 100 MG tablet ? ?Pharmacy: ?[id number] - [place], [place] - [address] AT [facility] OF [address] ? ?LOV 06/09/21 ?ROV 12/07/21 ?

## 2021-08-27 ENCOUNTER — Telehealth: Payer: Self-pay | Admitting: Internal Medicine

## 2021-08-27 NOTE — Telephone Encounter (Signed)
Placed application for disability placard in Dr. Barnett Applebaum bin.  Patient would like this form mailed to him.  He has attached self addressed envelope ?

## 2021-08-30 NOTE — Telephone Encounter (Signed)
Form given to PCP for signature.

## 2021-08-31 NOTE — Telephone Encounter (Signed)
Pt has been informed form is completed. ?

## 2021-09-02 ENCOUNTER — Other Ambulatory Visit: Payer: Self-pay | Admitting: Internal Medicine

## 2021-09-02 DIAGNOSIS — I10 Essential (primary) hypertension: Secondary | ICD-10-CM

## 2021-09-03 ENCOUNTER — Ambulatory Visit: Payer: Medicare PPO | Admitting: Internal Medicine

## 2021-09-03 ENCOUNTER — Encounter: Payer: Self-pay | Admitting: Internal Medicine

## 2021-09-03 VITALS — BP 100/64 | HR 76 | Ht 68.0 in | Wt 234.0 lb

## 2021-09-03 DIAGNOSIS — I10 Essential (primary) hypertension: Secondary | ICD-10-CM

## 2021-09-03 DIAGNOSIS — I495 Sick sinus syndrome: Secondary | ICD-10-CM

## 2021-09-03 DIAGNOSIS — Z950 Presence of cardiac pacemaker: Secondary | ICD-10-CM

## 2021-09-03 DIAGNOSIS — I480 Paroxysmal atrial fibrillation: Secondary | ICD-10-CM

## 2021-09-03 LAB — CUP PACEART INCLINIC DEVICE CHECK
Battery Remaining Longevity: 67 mo
Battery Voltage: 2.99 V
Brady Statistic RA Percent Paced: 0.93 %
Brady Statistic RV Percent Paced: 99.41 %
Date Time Interrogation Session: 20230421170126
Implantable Lead Implant Date: 20111202
Implantable Lead Implant Date: 20111202
Implantable Lead Location: 753859
Implantable Lead Location: 753860
Implantable Pulse Generator Implant Date: 20200930
Lead Channel Impedance Value: 387.5 Ohm
Lead Channel Impedance Value: 450 Ohm
Lead Channel Pacing Threshold Amplitude: 0.5 V
Lead Channel Pacing Threshold Amplitude: 0.5 V
Lead Channel Pacing Threshold Amplitude: 1.25 V
Lead Channel Pacing Threshold Amplitude: 1.25 V
Lead Channel Pacing Threshold Pulse Width: 0.4 ms
Lead Channel Pacing Threshold Pulse Width: 0.4 ms
Lead Channel Pacing Threshold Pulse Width: 1.2 ms
Lead Channel Pacing Threshold Pulse Width: 1.2 ms
Lead Channel Sensing Intrinsic Amplitude: 2.2 mV
Lead Channel Setting Pacing Amplitude: 1.875
Lead Channel Setting Pacing Amplitude: 2 V
Lead Channel Setting Pacing Pulse Width: 0.8 ms
Lead Channel Setting Sensing Sensitivity: 4 mV
Pulse Gen Model: 2272
Pulse Gen Serial Number: 9156495

## 2021-09-03 NOTE — Telephone Encounter (Signed)
NEW NOTE NOT NEEDED °

## 2021-09-03 NOTE — Progress Notes (Signed)
Remote pacemaker transmission.   

## 2021-09-03 NOTE — Patient Instructions (Signed)
Medication Instructions:  ?Your physician recommends that you continue on your current medications as directed. Please refer to the Current Medication list given to you today. ? ?Labwork: ?None ordered. ? ?Testing/Procedures: ?None ordered. ? ?Follow-Up: ?Your physician wants you to follow-up in: one year with Cristopher Peru, MD or one of the following Advanced Practice Providers on your designated Care Team:   ?Tommye Standard, PA-C ?Legrand Como "Jonni Sanger" [place], PA-C ? ?Remote monitoring is used to monitor your Pacemaker from home. This monitoring reduces the number of office visits required to check your device to one time per year. It allows [place] to keep an eye on the functioning of your device to ensure it is working properly. You are scheduled for a device check from home on 11/19/2021. You may send your transmission at any time that day. If you have a wireless device, the transmission will be sent automatically. After your physician reviews your transmission, you will receive a postcard with your next transmission date. ? ?Any Other Special Instructions Will Be Listed Below (If Applicable). ? ?If you need a refill on your cardiac medications before your next appointment, please call your pharmacy.  ? ?Important Information About Sugar ? ? ? ? ? ? ? ?

## 2021-09-03 NOTE — Progress Notes (Signed)
? ? ? ? ?HPI ?Connor Dixon returns today for followup. He is a pleasant 87 yo man with obesity, chronic systolic heart failure, HTN, and CHB, s/p PPM insertion. He also PAF. He has lost weight. He denies chest pain or sob. No syncope.  ?Allergies  ?Allergen Reactions  ? Oysters [Shellfish Allergy]   ?  Blisters and swelling  ? Connor Dixon [Dapagliflozin] Other (See Comments)  ?  Low blood sugar  ? Apple Juice Other (See Comments)  ?  Sore throat  ? ? ? ?Current Outpatient Medications  ?Medication Sig Dispense Refill  ? ACCU-CHEK GUIDE test strip USE FOUR TIMES DAILY 200 strip 5  ? allopurinol (ZYLOPRIM) 100 MG tablet TAKE 1 TABLET(100 MG) BY MOUTH DAILY 90 tablet 1  ? amoxicillin (AMOXIL) 500 MG capsule TAKE ONE CAPSULE BY MOUTH THREE TIMES DAILY. TAKE ONLY WHEN HAVING DENTAL PROCEDURES 30 capsule 2  ? apixaban (ELIQUIS) 2.5 MG TABS tablet Take 1 tablet (2.5 mg total) by mouth 2 (two) times daily. Please keep upcoming Cardiology appointment in April. 60 tablet 1  ? B-D ULTRAFINE III SHORT PEN 31G X 8 MM MISC USE TO ADMINISTER INSULIN THREE TIMES DAILY 300 each 2  ? blood glucose meter kit and supplies KIT Use to check blood sugar daily. DX: E11.9 1 each 0  ? calcitRIOL (ROCALTROL) 0.25 MCG capsule Take 1 capsule (0.25 mcg total) by mouth every other day. 45 capsule 1  ? carvedilol (COREG) 6.25 MG tablet TAKE 1 TABLET(6.25 MG) BY MOUTH TWICE DAILY WITH A MEAL 180 tablet 0  ? Continuous Blood Gluc Receiver (FREESTYLE LIBRE 2 READER) DEVI 1 Act by Does not apply route daily. 2 each 5  ? Continuous Blood Gluc Sensor (FREESTYLE LIBRE 2 SENSOR) MISC 1 Act by Does not apply route daily. 2 each 5  ? Finerenone (KERENDIA) 10 MG TABS Take 1 tablet by mouth daily. 30 tablet 0  ? furosemide (LASIX) 40 MG tablet Take 2 tablets in the morning and 1 tablet in the afternoon. 270 tablet 3  ? Glucagon (GVOKE HYPOPEN 2-PACK) 1 MG/0.2ML SOAJ Inject 1 Act into the skin daily as needed. 2 mL 5  ? insulin isophane & regular human KwikPen  (HUMULIN 70/30 MIX) (70-30) 100 UNIT/ML KwikPen 40 units QAM ?25 units QPM 3 mL 0  ? Insulin Syringe-Needle U-100 (BD INSULIN SYRINGE U/F) 31G X 5/16" 0.5 ML MISC USE AS DIRECTED FOUR TIMES DAILY 200 each 1  ? Lancets 33G MISC Use to test blood sugar daily. DX: E11.9 100 each 3  ? loratadine (CLARITIN) 10 MG tablet Take 10 mg by mouth daily as needed for allergies.    ? losartan (COZAAR) 25 MG tablet TAKE 1 TABLET(25 MG) BY MOUTH DAILY 90 tablet 1  ? pravastatin (PRAVACHOL) 40 MG tablet TAKE 1 TABLET(40 MG) BY MOUTH DAILY 90 tablet 0  ? ?No current facility-administered medications for this visit.  ? ? ? ?Past Medical History:  ?Diagnosis Date  ? Acute on chronic diastolic congestive heart failure ([place])   ? Arthus phenomenon   ? Atrioventricular block, complete (HCC)   ? Cardiac pacemaker in situ 07/22/2010  ? Chronic kidney disease, stage 3 (HCC)   ? Presumably from longstanding DM & HTN. Scr has gradually increased over the past 2 years (1.6 in 12/2010, 2 in 03/2011, and up to 2.1 in 01/2013)  ? DIABETES MELLITUS, TYPE I, ADULT ONSET 08/06/2007  ? DM (diabetes mellitus) ([place])   ? adult onset   ? Essential hypertension,  benign 08/06/2007  ? Very good BP control  ? History of second degree heart block   ? HLD (hyperlipidemia)   ? HYPERLIPIDEMIA 08/06/2007  ? Hypopotassemia   ? Morbid obesity ([place])   ? Obesity   ? Other and unspecified angina pectoris   ? typical  ? Type I (juvenile type) diabetes mellitus without mention of complication, not stated as uncontrolled   ? (04/25/13 OV with Dr. Marval Regal) = Poorly controlled & is working  w/Dr. Crissie Sickles to improve this. Most recent Hgb A1c was 8.2% but Connor Dixon reports it was down to 7.6 last month.  ? ? ?ROS: ? ? All systems reviewed and negative except as noted in the HPI. ? ? ?Past Surgical History:  ?Procedure Laterality Date  ? CATARACT EXTRACTION  8/08  ? [place]  ? PPM GENERATOR CHANGEOUT N/A 02/13/2019  ? Procedure: PPM GENERATOR CHANGEOUT;  Surgeon: Evans Lance, MD;  Location: [facility];  Service: Cardiovascular;  Laterality: N/A;  ? PTVDP  12/11  ? PPM - St. Jude  ? TOTAL HIP ARTHROPLASTY  04/05/06  ? right  ? ? ? ?Family History  ?Problem Relation Age of Onset  ? Asthma Father   ? Coronary artery disease Father   ? Diabetes Father   ? Hyperlipidemia Father   ? Stroke Father 40  ? Coronary artery disease Mother   ? Asthma Mother   ? Heart attack Mother 71  ? Colon cancer Neg Hx   ?     no definate prostate CA   ? ? ? ?Social History  ? ?Socioeconomic History  ? Marital status: Widowed  ?  Spouse name: Not on file  ? Number of children: 2  ? Years of education: Not on file  ? Highest education level: Not on file  ?Occupational History  ? Occupation: retired  ?Tobacco Use  ? Smoking status: Never  ? Smokeless tobacco: Never  ?Vaping Use  ? Vaping Use: Never used  ?Substance and Sexual Activity  ? Alcohol use: No  ? Drug use: No  ? Sexual activity: Not Currently  ?Other Topics Concern  ? Not on file  ?Social History Narrative  ? [employer] BA, 2 Master's degrees. [employer] - for certification Education specialist.   ? Retired Tree surgeon.  ? Active - travels.  ? Married - 1961; 2 sons ([place]) and 5 grandchildren.   ?   ?  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   ? ?Social Determinants of Health  ? ?Financial Resource Strain: Low Risk   ? Difficulty of Paying Living Expenses: Not hard at all  ?Food Insecurity: No Food Insecurity  ? Worried About Charity fundraiser in the Last Year: Never true  ? Ran Out of Food in the Last Year: Never true  ?Transportation Needs: No Transportation Needs  ? Lack of Transportation (Medical): No  ? Lack of Transportation (Non-Medical): No  ?Physical Activity: Insufficiently Active  ? Days of Exercise per Week: 3 days  ? Minutes of Exercise per Session: 30 min  ?Stress: No Stress Concern Present  ? Feeling of Stress : Not at all  ?Social Connections: Moderately Isolated  ? Frequency of Communication  with Friends and Family: Twice a week  ? Frequency of Social Gatherings with Friends and Family: Twice a week  ? Attends Religious Services: 1 to 4 times per year  ? Active Member of Clubs or Organizations: No  ? Attends Archivist  Meetings: Never  ? Marital Status: Widowed  ?Intimate Partner Violence: Not At Risk  ? Fear of Current or Ex-Partner: No  ? Emotionally Abused: No  ? Physically Abused: No  ? Sexually Abused: No  ? ? ? ?BP 100/64   Pulse 76   Ht '5\' 8"'  (1.727 m)   Wt 234 lb (106.1 kg)   SpO2 96%   BMI 35.58 kg/m?  ? ?Physical Exam: ? ?Well appearing NAD ?HEENT: Unremarkable ?Neck:  No JVD, no thyromegally ?Lymphatics:  No adenopathy ?Back:  No CVA tenderness ?Lungs:  Clear ?HEART:  Regular rate rhythm, no murmurs, no rubs, no clicks ?Abd:  soft, positive bowel sounds, no organomegally, no rebound, no guarding ?Ext:  2 plus pulses, no edema, no cyanosis, no clubbing ?Skin:  No rashes no nodules ?Neuro:  CN II through XII intact, motor grossly intact ? ?EKG - nsr with ventricular pacing ? ?DEVICE  ?Normal device function.  See PaceArt for details.  ? ?Assess/Plan:  ?1. CHB - he is asymptomatic,s/p PPM insertion. ?2. PPM - his St. Jude DDD PM is working normally. The noise on his lead is very brief and I have recommended watchful waiting. His impedences and threshold is stable. ?3. PAF - he is maintaining NSR.  ?4. HTN - his sbp is well controlled. We will follow. No change in meds.  ? ?Connor Overlie Dailyn Kempner,MD ?

## 2021-09-07 ENCOUNTER — Other Ambulatory Visit: Payer: Self-pay | Admitting: Pharmacist

## 2021-09-07 ENCOUNTER — Other Ambulatory Visit: Payer: Self-pay | Admitting: Internal Medicine

## 2021-09-07 DIAGNOSIS — I10 Essential (primary) hypertension: Secondary | ICD-10-CM

## 2021-09-07 DIAGNOSIS — M10071 Idiopathic gout, right ankle and foot: Secondary | ICD-10-CM

## 2021-09-07 DIAGNOSIS — E0840 Diabetes mellitus due to underlying condition with diabetic neuropathy, unspecified: Secondary | ICD-10-CM

## 2021-09-07 MED ORDER — APIXABAN 2.5 MG PO TABS: 2.5000 mg | ORAL_TABLET | Freq: Two times a day (BID) | ORAL | 5 refills | Status: DC

## 2021-10-06 LAB — HM DIABETES EYE EXAM

## 2021-10-19 ENCOUNTER — Telehealth: Payer: Self-pay | Admitting: Internal Medicine

## 2021-10-19 ENCOUNTER — Other Ambulatory Visit: Payer: Self-pay | Admitting: Internal Medicine

## 2021-10-19 DIAGNOSIS — E119 Type 2 diabetes mellitus without complications: Secondary | ICD-10-CM

## 2021-10-19 DIAGNOSIS — E0840 Diabetes mellitus due to underlying condition with diabetic neuropathy, unspecified: Secondary | ICD-10-CM

## 2021-10-19 DIAGNOSIS — E0821 Diabetes mellitus due to underlying condition with diabetic nephropathy: Secondary | ICD-10-CM

## 2021-10-19 MED ORDER — INSULIN ISOPHANE & REGULAR (HUMAN 70-30)100 UNIT/ML KWIKPEN: PEN_INJECTOR | SUBCUTANEOUS | 0 refills | Status: DC

## 2021-10-19 NOTE — Telephone Encounter (Signed)
Patient needs insulin pins sent to [facility] on [address].  Patient has plenty of needles.  He would ike to change from syringes to pens

## 2021-10-19 NOTE — Telephone Encounter (Signed)
Pt has an OV on 7/25.  Pt has been informed per PCP that refill was sent.

## 2021-11-01 ENCOUNTER — Other Ambulatory Visit: Payer: Self-pay | Admitting: Internal Medicine

## 2021-11-01 DIAGNOSIS — E785 Hyperlipidemia, unspecified: Secondary | ICD-10-CM

## 2021-11-05 ENCOUNTER — Other Ambulatory Visit: Payer: Self-pay | Admitting: Internal Medicine

## 2021-11-05 DIAGNOSIS — N1832 Chronic kidney disease, stage 3b: Secondary | ICD-10-CM

## 2021-11-09 DIAGNOSIS — H35371 Puckering of macula, right eye: Secondary | ICD-10-CM | POA: Diagnosis not present

## 2021-11-09 DIAGNOSIS — E113293 Type 2 diabetes mellitus with mild nonproliferative diabetic retinopathy without macular edema, bilateral: Secondary | ICD-10-CM | POA: Diagnosis not present

## 2021-11-19 ENCOUNTER — Ambulatory Visit (INDEPENDENT_AMBULATORY_CARE_PROVIDER_SITE_OTHER): Payer: Medicare PPO

## 2021-11-19 DIAGNOSIS — I495 Sick sinus syndrome: Secondary | ICD-10-CM

## 2021-11-20 LAB — CUP PACEART REMOTE DEVICE CHECK
Battery Remaining Longevity: 80 mo
Battery Remaining Percentage: 67 %
Battery Voltage: 2.99 V
Brady Statistic AP VP Percent: 2.2 %
Brady Statistic AP VS Percent: 1 %
Brady Statistic AS VP Percent: 97 %
Brady Statistic AS VS Percent: 1 %
Brady Statistic RA Percent Paced: 1.8 %
Brady Statistic RV Percent Paced: 99 %
Date Time Interrogation Session: 20230707064543
Implantable Lead Implant Date: 20111202
Implantable Lead Implant Date: 20111202
Implantable Lead Location: 753859
Implantable Lead Location: 753860
Implantable Pulse Generator Implant Date: 20200930
Lead Channel Impedance Value: 400 Ohm
Lead Channel Impedance Value: 480 Ohm
Lead Channel Pacing Threshold Amplitude: 0.5 V
Lead Channel Pacing Threshold Amplitude: 2.125 V
Lead Channel Pacing Threshold Pulse Width: 0.4 ms
Lead Channel Pacing Threshold Pulse Width: 0.8 ms
Lead Channel Sensing Intrinsic Amplitude: 1.6 mV
Lead Channel Sensing Intrinsic Amplitude: 7.6 mV
Lead Channel Setting Pacing Amplitude: 2 V
Lead Channel Setting Pacing Amplitude: 2.375
Lead Channel Setting Pacing Pulse Width: 0.8 ms
Lead Channel Setting Sensing Sensitivity: 4 mV
Pulse Gen Model: 2272
Pulse Gen Serial Number: 9156495

## 2021-12-01 ENCOUNTER — Other Ambulatory Visit: Payer: Self-pay | Admitting: Internal Medicine

## 2021-12-01 DIAGNOSIS — E0840 Diabetes mellitus due to underlying condition with diabetic neuropathy, unspecified: Secondary | ICD-10-CM

## 2021-12-01 DIAGNOSIS — E785 Hyperlipidemia, unspecified: Secondary | ICD-10-CM

## 2021-12-01 DIAGNOSIS — I10 Essential (primary) hypertension: Secondary | ICD-10-CM

## 2021-12-02 ENCOUNTER — Other Ambulatory Visit: Payer: Self-pay | Admitting: Internal Medicine

## 2021-12-02 ENCOUNTER — Telehealth: Payer: Self-pay | Admitting: Internal Medicine

## 2021-12-02 DIAGNOSIS — E0821 Diabetes mellitus due to underlying condition with diabetic nephropathy: Secondary | ICD-10-CM

## 2021-12-02 DIAGNOSIS — E119 Type 2 diabetes mellitus without complications: Secondary | ICD-10-CM

## 2021-12-02 DIAGNOSIS — E0840 Diabetes mellitus due to underlying condition with diabetic neuropathy, unspecified: Secondary | ICD-10-CM

## 2021-12-02 MED ORDER — INSULIN ISOPHANE & REGULAR (HUMAN 70-30)100 UNIT/ML KWIKPEN: PEN_INJECTOR | SUBCUTANEOUS | 0 refills | Status: DC

## 2021-12-02 MED ORDER — BD PEN NEEDLE SHORT U/F 31G X 8 MM MISC: 2 refills | Status: DC

## 2021-12-02 MED ORDER — INSULIN ISOPHANE & REGULAR (HUMAN 70-30)100 UNIT/ML KWIKPEN
PEN_INJECTOR | SUBCUTANEOUS | 0 refills | Status: DC
Start: 2021-12-02 — End: 2021-12-02

## 2021-12-02 NOTE — Telephone Encounter (Signed)
Pt informed

## 2021-12-02 NOTE — Telephone Encounter (Signed)
Pt is requesting a call. He stated he is out of insulin. Insulin Pen Needle (B-D ULTRAFINE III SHORT PEN) 31G X 8 MM MISC. Pt stated stated he took his blood sugar this morning and it was 400. He stated he hasn't had any insulin in 24 hours. Pt stated the pharmacy told him he needed an appointment before he can get rx. Pt stated he has an appointment on 12/07/21.   Please advise   CB: [phone]

## 2021-12-03 NOTE — Progress Notes (Signed)
Remote pacemaker transmission.   

## 2021-12-06 ENCOUNTER — Other Ambulatory Visit: Payer: Self-pay | Admitting: Internal Medicine

## 2021-12-06 DIAGNOSIS — E559 Vitamin D deficiency, unspecified: Secondary | ICD-10-CM

## 2021-12-07 ENCOUNTER — Ambulatory Visit: Payer: Medicare PPO | Admitting: Internal Medicine

## 2021-12-07 ENCOUNTER — Encounter: Payer: Self-pay | Admitting: Internal Medicine

## 2021-12-07 VITALS — BP 118/78 | HR 77 | Temp 98.1°F | Resp 16 | Ht 68.0 in | Wt 225.0 lb

## 2021-12-07 DIAGNOSIS — I10 Essential (primary) hypertension: Secondary | ICD-10-CM | POA: Diagnosis not present

## 2021-12-07 DIAGNOSIS — E119 Type 2 diabetes mellitus without complications: Secondary | ICD-10-CM

## 2021-12-07 DIAGNOSIS — Z794 Long term (current) use of insulin: Secondary | ICD-10-CM | POA: Diagnosis not present

## 2021-12-07 DIAGNOSIS — I480 Paroxysmal atrial fibrillation: Secondary | ICD-10-CM

## 2021-12-07 DIAGNOSIS — E785 Hyperlipidemia, unspecified: Secondary | ICD-10-CM

## 2021-12-07 DIAGNOSIS — E559 Vitamin D deficiency, unspecified: Secondary | ICD-10-CM

## 2021-12-07 DIAGNOSIS — N1832 Chronic kidney disease, stage 3b: Secondary | ICD-10-CM

## 2021-12-07 LAB — URINALYSIS, ROUTINE W REFLEX MICROSCOPIC
Bilirubin Urine: NEGATIVE
Hgb urine dipstick: NEGATIVE
Ketones, ur: NEGATIVE
Leukocytes,Ua: NEGATIVE
Nitrite: NEGATIVE
RBC / HPF: NONE SEEN (ref 0–?)
Specific Gravity, Urine: 1.01 (ref 1.000–1.030)
Urine Glucose: 500 — AB
Urobilinogen, UA: 0.2 (ref 0.0–1.0)
WBC, UA: NONE SEEN (ref 0–?)
pH: 6.5 (ref 5.0–8.0)

## 2021-12-07 LAB — CBC WITH DIFFERENTIAL/PLATELET
Basophils Absolute: 0 10*3/uL (ref 0.0–0.1)
Basophils Relative: 0.5 % (ref 0.0–3.0)
Eosinophils Absolute: 0.3 10*3/uL (ref 0.0–0.7)
Eosinophils Relative: 4.9 % (ref 0.0–5.0)
HCT: 43.9 % (ref 39.0–52.0)
Hemoglobin: 14.4 g/dL (ref 13.0–17.0)
Lymphocytes Relative: 21.2 % (ref 12.0–46.0)
Lymphs Abs: 1.1 10*3/uL (ref 0.7–4.0)
MCHC: 32.9 g/dL (ref 30.0–36.0)
MCV: 94.3 fl (ref 78.0–100.0)
Monocytes Absolute: 0.6 10*3/uL (ref 0.1–1.0)
Monocytes Relative: 11 % (ref 3.0–12.0)
Neutro Abs: 3.4 10*3/uL (ref 1.4–7.7)
Neutrophils Relative %: 62.4 % (ref 43.0–77.0)
Platelets: 185 10*3/uL (ref 150.0–400.0)
RBC: 4.66 Mil/uL (ref 4.22–5.81)
RDW: 14.5 % (ref 11.5–15.5)
WBC: 5.4 10*3/uL (ref 4.0–10.5)

## 2021-12-07 LAB — BASIC METABOLIC PANEL
BUN: 36 mg/dL — ABNORMAL HIGH (ref 6–23)
CO2: 30 mEq/L (ref 19–32)
Calcium: 9.6 mg/dL (ref 8.4–10.5)
Chloride: 97 mEq/L (ref 96–112)
Creatinine, Ser: 2 mg/dL — ABNORMAL HIGH (ref 0.40–1.50)
GFR: 30 mL/min — ABNORMAL LOW (ref >60.00)
Glucose, Bld: 295 mg/dL — ABNORMAL HIGH (ref 70–99)
Potassium: 4.2 mEq/L (ref 3.5–5.1)
Sodium: 135 mEq/L (ref 135–145)

## 2021-12-07 LAB — LIPID PANEL
Cholesterol: 148 mg/dL (ref 0–200)
HDL: 46.6 mg/dL (ref >39.00)
LDL Cholesterol: 80 mg/dL (ref 0–99)
NonHDL: 101.7
Total CHOL/HDL Ratio: 3
Triglycerides: 107 mg/dL (ref 0.0–149.0)
VLDL: 21.4 mg/dL (ref 0.0–40.0)

## 2021-12-07 LAB — VITAMIN D 25 HYDROXY (VIT D DEFICIENCY, FRACTURES): VITD: 34.81 ng/mL (ref 30.00–100.00)

## 2021-12-07 LAB — HEMOGLOBIN A1C: Hgb A1c MFr Bld: 8.7 % — ABNORMAL HIGH (ref 4.6–6.5)

## 2021-12-07 NOTE — Patient Instructions (Signed)
Type 1 Diabetes Mellitus, Diagnosis, Adult  Type 1 diabetes (type 1 diabetes mellitus) is a long-term, or chronic, disease. It occurs when the cells in the pancreas (beta cells) that make a hormone called insulin are destroyed. Normally, insulin allows blood sugar, also called glucose, to enter cells in the body. The cells use glucose for energy. Lack of insulin causes excess glucose to build up in the blood instead of going into cells. As a result, high blood glucose, or hyperglycemia, develops. There is currently no cure for type 1 diabetes, but it can be managed with insulin therapy and lifestyle changes. What are the causes? The exact cause of type 1 diabetes is not known. What increases the risk? You may be more likely to develop this condition if you have a family member who has type 1 diabetes. Other factors may also make you more likely to develop type 1 diabetes, such as: Having a gene for type 1 diabetes that is passed from parent to child (inherited). Having autoimmune disorders. These are conditions in which the body's disease-fighting system (immunesystem) attacks itself. Being exposed to certain viruses. Living in an area with cold weather conditions. What are the signs or symptoms? Symptoms may develop slowly over days or weeks, or they may develop suddenly. Symptoms may include: Increased thirst or hunger. Increased urination or increased urination during the night. Sudden or unexplained weight changes. Tiredness (fatigue) or weakness. Vision changes, such as blurry vision. How is this diagnosed? This condition is diagnosed based on your symptoms, your medical history, a physical exam, and your blood glucose level. Your blood glucose may be checked with one or more of the following blood tests: A fasting blood glucose (FBG) test. You will not be allowed to eat (you will fast) for 8 hours or longer before a blood sample is taken. A random blood glucose test. This checks blood  glucose at any time of day no matter when you ate. An A1C (hemoglobin A1C) blood test. This gives information about blood glucose control over the last 2-3 months. You may be diagnosed with type 1 diabetes if: Your FBG level is 126 mg/dL (7 mmol/L) or higher. Your random blood glucose level is 200 mg/dL (11.1 mmol/L) or higher. Your hemoglobin A1C level is 6.5% or higher. These blood tests may be repeated to confirm your diagnosis. How is this treated? This condition may be managed by a specialist called an endocrinologist. You can help manage your type 1 diabetes by following instructions from your health care provider about: Taking insulin daily. This helps to keep your blood glucose levels in the healthy range. Taking medicines to help prevent complications from diabetes. These may include: Aspirin. Medicine to lower cholesterol. Medicine to control blood pressure. Checking your blood glucose as often as told. Making diet and lifestyle changes. These may include: Following an individualized nutrition plan that is developed by a registered dietitian. Getting regular exercise. Finding ways to manage stress. Your health care provider will set individualized treatment goals for you. Your goals will be based on your age, other medical conditions you have, and how you respond to diabetes treatment. In general, the goal of treatment is to maintain the following blood glucose levels: Before meals (preprandial): 80-130 mg/dL (4.4-7.2 mmol/L). After meals (postprandial): below 180 mg/dL (10 mmol/L). Hemoglobin A1C level: less than 7%. Follow these instructions at home: Questions to ask your health care provider Consider asking the following questions: Do I need to meet with a certified diabetes care and education   specialist? Should I consider joining a support group for people with diabetes? What equipment will I need to manage my diabetes at home? What diabetes medicines should I take, and  when? How often should I check my blood glucose? What number should I call if I have questions? When is my next appointment? General instructions Take over-the-counter and prescription medicines only as told by your health care provider. Keep all follow-up visits as told by your health care provider. This is important. Where to find more information American Diabetes Association (ADA): diabetes.org Association of Diabetes Care and Education Specialists (ADCES): diabeteseducator.org International Diabetes Federation (IDF): idf.org Contact a health care provider if: Your blood glucose level is higher than 240 mg/dL (13.3 mmol/L) for 2 days in a row. You have been sick or have had a fever for 2 days or more and you are not getting better. You have any of the following problems for more than 6 hours: You cannot eat or drink. You have nausea and vomiting. You have diarrhea. Get help right away if: Your blood glucose is below 54 mg/dL (3 mmol/L). You become confused or you have trouble thinking clearly. You have trouble breathing. These symptoms may represent a serious problem that is an emergency. Do not wait to see if the symptoms will go away. Get medical help right away. Call your local emergency services (911 in the [place] Do not drive yourself to the hospital. Summary Type 1 diabetes (type 1 diabetes mellitus) is a long-term, or chronic, disease. It occurs when the cells in the pancreas (beta cells) that make a hormone called insulin are destroyed. There is currently no cure for type 1 diabetes. The exact cause of type 1 diabetes is not known. This condition is treated by taking insulin and other medicines to help prevent complications from diabetes. Diet and lifestyle changes are also part of treatment. Your health care provider will set individualized treatment goals for you. Your goals will be based on your age, other medical conditions you have, and how you respond to diabetes  treatment. This information is not intended to replace advice given to you by your health care provider. Make sure you discuss any questions you have with your health care provider. Document Revised: 06/20/2019 Document Reviewed: 06/20/2019 Elsevier Patient Education  2022 Elsevier Inc.  

## 2021-12-07 NOTE — Progress Notes (Unsigned)
Subjective:  Patient ID: Connor Dixon, male    DOB: [birth year]  Age: 87 y.o. MRN: [id number]  CC: No chief complaint on file.   HPI Connor Dixon presents for ***  Outpatient Medications Prior to Visit  Medication Sig Dispense Refill   ACCU-CHEK GUIDE test strip USE FOUR TIMES DAILY 200 strip 5   allopurinol (ZYLOPRIM) 100 MG tablet TAKE 1 TABLET(100 MG) BY MOUTH DAILY 90 tablet 1   amoxicillin (AMOXIL) 500 MG capsule TAKE ONE CAPSULE BY MOUTH THREE TIMES DAILY. TAKE ONLY WHEN HAVING DENTAL PROCEDURES 30 capsule 2   apixaban (ELIQUIS) 2.5 MG TABS tablet Take 1 tablet (2.5 mg total) by mouth 2 (two) times daily. 60 tablet 5   blood glucose meter kit and supplies KIT Use to check blood sugar daily. DX: E11.9 1 each 0   calcitRIOL (ROCALTROL) 0.25 MCG capsule Take 1 capsule (0.25 mcg total) by mouth every other day. 45 capsule 1   carvedilol (COREG) 6.25 MG tablet TAKE 1 TABLET(6.25 MG) BY MOUTH TWICE DAILY WITH A MEAL 180 tablet 0   Continuous Blood Gluc Receiver (FREESTYLE LIBRE 2 READER) DEVI 1 Act by Does not apply route daily. 2 each 5   Continuous Blood Gluc Sensor (FREESTYLE LIBRE 2 SENSOR) MISC 1 Act by Does not apply route daily. 2 each 5   Finerenone (KERENDIA) 10 MG TABS Take 1 tablet by mouth daily. 30 tablet 0   furosemide (LASIX) 40 MG tablet Take 2 tablets in the morning and 1 tablet in the afternoon. 270 tablet 3   Glucagon (GVOKE HYPOPEN 2-PACK) 1 MG/0.2ML SOAJ Inject 1 Act into the skin daily as needed. 2 mL 5   insulin isophane & regular human KwikPen (HUMULIN 70/30 MIX) (70-30) 100 UNIT/ML KwikPen 40 units QAM 25 units QPM 3 mL 0   Insulin Pen Needle (B-D ULTRAFINE III SHORT PEN) 31G X 8 MM MISC USE TO ADMINISTER INSULIN THREE TIMES DAILY 300 each 2   Insulin Syringe-Needle U-100 (BD INSULIN SYRINGE U/F) 31G X 5/16" 0.5 ML MISC USE AS DIRECTED FOUR TIMES DAILY 200 each 1   Lancets 33G MISC Use to test blood sugar daily. DX: E11.9 100 each 3   loratadine (CLARITIN) 10  MG tablet Take 10 mg by mouth daily as needed for allergies.     losartan (COZAAR) 25 MG tablet TAKE 1 TABLET(25 MG) BY MOUTH DAILY 90 tablet 0   pravastatin (PRAVACHOL) 40 MG tablet TAKE 1 TABLET(40 MG) BY MOUTH DAILY 90 tablet 0   No facility-administered medications prior to visit.    ROS Review of Systems  Objective:  Ht '5\' 8"'  (1.727 m)   Wt 225 lb (102.1 kg)   BMI 34.21 kg/m   BP Readings from Last 3 Encounters:  09/03/21 100/64  06/09/21 126/74  12/09/20 126/80    Wt Readings from Last 3 Encounters:  12/07/21 225 lb (102.1 kg)  09/03/21 234 lb (106.1 kg)  06/09/21 238 lb (108 kg)    Physical Exam  Lab Results  Component Value Date   WBC 6.9 06/09/2021   HGB 14.4 06/09/2021   HCT 44.9 06/09/2021   PLT 184.0 06/09/2021   GLUCOSE 183 (H) 06/09/2021   CHOL 143 12/09/2020   TRIG 78.0 12/09/2020   HDL 49.20 12/09/2020   LDLCALC 78 12/09/2020   ALT 13 06/09/2021   AST 22 06/09/2021   NA 140 06/09/2021   K 4.0 06/09/2021   CL 101 06/09/2021   CREATININE 1.97 (H) 06/09/2021  BUN 36 (H) 06/09/2021   CO2 32 06/09/2021   TSH 1.78 06/09/2021   PSA 1.77 01/06/2010   HGBA1C 9.1 (H) 06/09/2021   MICROALBUR 50.7 (H) 06/09/2021    EP PPM/ICD IMPLANT  Result Date: 02/13/2019 Conclusion: Successful dual-chamber pacemaker generator removal and insertion of a new dual-chamber pacemaker in a patient with complete heart block who had reached elective replacement. Cristopher Peru, MD   Assessment & Plan:   There are no diagnoses linked to this encounter. I am having Connor Dixon maintain his amoxicillin, loratadine, blood glucose meter kit and supplies, Lancets 33G, furosemide, Gvoke HypoPen 2-Pack, Accu-Chek Guide, calcitRIOL, Kerendia, FreeStyle Libre 2 Sensor, [employer] 2 Reader, Insulin Syringe-Needle U-100, allopurinol, carvedilol, apixaban, losartan, pravastatin, insulin isophane & regular human KwikPen, and B-D ULTRAFINE III SHORT PEN.  No orders of the  defined types were placed in this encounter.    Follow-up: No follow-ups on file.  Scarlette Calico, MD

## 2022-02-18 ENCOUNTER — Ambulatory Visit (INDEPENDENT_AMBULATORY_CARE_PROVIDER_SITE_OTHER): Payer: Medicare PPO

## 2022-02-18 DIAGNOSIS — I495 Sick sinus syndrome: Secondary | ICD-10-CM

## 2022-02-21 ENCOUNTER — Telehealth: Payer: Self-pay | Admitting: Internal Medicine

## 2022-02-21 DIAGNOSIS — I10 Essential (primary) hypertension: Secondary | ICD-10-CM

## 2022-02-21 LAB — CUP PACEART REMOTE DEVICE CHECK
Battery Remaining Longevity: 63 mo
Battery Remaining Percentage: 63 %
Battery Voltage: 2.99 V
Brady Statistic AP VP Percent: 2.5 %
Brady Statistic AP VS Percent: 1 %
Brady Statistic AS VP Percent: 97 %
Brady Statistic AS VS Percent: 1 %
Brady Statistic RA Percent Paced: 2.1 %
Brady Statistic RV Percent Paced: 99 %
Date Time Interrogation Session: 20231006072858
Implantable Lead Implant Date: 20111202
Implantable Lead Implant Date: 20111202
Implantable Lead Location: 753859
Implantable Lead Location: 753860
Implantable Pulse Generator Implant Date: 20200930
Lead Channel Impedance Value: 380 Ohm
Lead Channel Impedance Value: 480 Ohm
Lead Channel Pacing Threshold Amplitude: 0.5 V
Lead Channel Pacing Threshold Amplitude: 1.375 V
Lead Channel Pacing Threshold Pulse Width: 0.4 ms
Lead Channel Pacing Threshold Pulse Width: 0.8 ms
Lead Channel Sensing Intrinsic Amplitude: 1.6 mV
Lead Channel Sensing Intrinsic Amplitude: 6.4 mV
Lead Channel Setting Pacing Amplitude: 1.625
Lead Channel Setting Pacing Amplitude: 2 V
Lead Channel Setting Pacing Pulse Width: 0.8 ms
Lead Channel Setting Sensing Sensitivity: 4 mV
Pulse Gen Model: 2272
Pulse Gen Serial Number: 9156495

## 2022-02-21 MED ORDER — CARVEDILOL 6.25 MG PO TABS: ORAL_TABLET | ORAL | 0 refills | Status: DC

## 2022-02-21 NOTE — Telephone Encounter (Signed)
Patient needs his carvedilol - he is completely out of his medication - Please send to [facility] on [address]

## 2022-02-22 MED ORDER — CARVEDILOL 6.25 MG PO TABS: ORAL_TABLET | ORAL | 0 refills | Status: DC

## 2022-02-22 NOTE — Addendum Note (Signed)
Addended by: Hinda Kehr on: 02/22/2022 03:42 PM   Modules accepted: Orders

## 2022-02-22 NOTE — Telephone Encounter (Signed)
Rx sent again

## 2022-02-22 NOTE — Telephone Encounter (Signed)
[facility] told patient that rx did not go through from yesterday.

## 2022-02-22 NOTE — Progress Notes (Signed)
Remote pacemaker transmission.   

## 2022-02-25 ENCOUNTER — Encounter: Payer: Self-pay | Admitting: Internal Medicine

## 2022-02-25 ENCOUNTER — Other Ambulatory Visit: Payer: Self-pay | Admitting: Internal Medicine

## 2022-02-25 DIAGNOSIS — E785 Hyperlipidemia, unspecified: Secondary | ICD-10-CM

## 2022-03-01 ENCOUNTER — Other Ambulatory Visit: Payer: Self-pay | Admitting: Internal Medicine

## 2022-03-01 NOTE — Telephone Encounter (Signed)
Prescription refill request for Eliquis received. Indication:Afib Last office visit:4/23 Scr:2.0 Age: 87 Weight:102.1 kg  Prescription refilled

## 2022-03-09 ENCOUNTER — Other Ambulatory Visit: Payer: Self-pay | Admitting: Internal Medicine

## 2022-03-09 DIAGNOSIS — M1A3790 Chronic gout due to renal impairment, unspecified ankle and foot, without tophus (tophi): Secondary | ICD-10-CM

## 2022-03-09 DIAGNOSIS — M10071 Idiopathic gout, right ankle and foot: Secondary | ICD-10-CM

## 2022-03-17 ENCOUNTER — Ambulatory Visit (INDEPENDENT_AMBULATORY_CARE_PROVIDER_SITE_OTHER): Payer: Medicare PPO | Admitting: *Deleted

## 2022-03-17 DIAGNOSIS — Z23 Encounter for immunization: Secondary | ICD-10-CM

## 2022-03-17 NOTE — Progress Notes (Signed)
Administered high dose flu shot right deltoid. Pt tolerated well. 

## 2022-03-21 ENCOUNTER — Other Ambulatory Visit: Payer: Self-pay | Admitting: Internal Medicine

## 2022-03-21 DIAGNOSIS — E0840 Diabetes mellitus due to underlying condition with diabetic neuropathy, unspecified: Secondary | ICD-10-CM

## 2022-03-21 DIAGNOSIS — Z794 Long term (current) use of insulin: Secondary | ICD-10-CM

## 2022-03-24 ENCOUNTER — Other Ambulatory Visit: Payer: Self-pay | Admitting: Internal Medicine

## 2022-03-24 ENCOUNTER — Telehealth: Payer: Self-pay | Admitting: Internal Medicine

## 2022-03-24 DIAGNOSIS — E119 Type 2 diabetes mellitus without complications: Secondary | ICD-10-CM

## 2022-03-24 MED ORDER — INSULIN NPH ISOPHANE & REGULAR (70-30) 100 UNIT/ML ~~LOC~~ SUSP: SUBCUTANEOUS | 1 refills | Status: DC

## 2022-03-24 NOTE — Telephone Encounter (Signed)
Patient needs a refill on his insulin - Humalin 30 - He would like the vials not the quick pens - Please send [facility] at [address] -

## 2022-03-25 NOTE — Telephone Encounter (Signed)
Rx has been faxed to [facility] at [facility]

## 2022-04-04 ENCOUNTER — Encounter: Payer: Self-pay | Admitting: Internal Medicine

## 2022-04-04 DIAGNOSIS — N1831 Chronic kidney disease, stage 3a: Secondary | ICD-10-CM | POA: Diagnosis not present

## 2022-04-05 ENCOUNTER — Other Ambulatory Visit: Payer: Self-pay | Admitting: Internal Medicine

## 2022-04-05 DIAGNOSIS — E119 Type 2 diabetes mellitus without complications: Secondary | ICD-10-CM

## 2022-04-05 DIAGNOSIS — E0840 Diabetes mellitus due to underlying condition with diabetic neuropathy, unspecified: Secondary | ICD-10-CM

## 2022-04-05 MED ORDER — DEXCOM G7 SENSOR MISC: 1.0000 | Freq: Every day | 1 refills | Status: DC

## 2022-04-05 MED ORDER — DEXCOM G7 RECEIVER DEVI: 1.0000 | Freq: Every day | 1 refills | Status: DC

## 2022-04-05 MED ORDER — GVOKE HYPOPEN 2-PACK 1 MG/0.2ML ~~LOC~~ SOAJ: 1.0000 | Freq: Every day | SUBCUTANEOUS | 5 refills | Status: DC | PRN

## 2022-04-11 ENCOUNTER — Ambulatory Visit (INDEPENDENT_AMBULATORY_CARE_PROVIDER_SITE_OTHER): Payer: Medicare PPO

## 2022-04-11 VITALS — Ht 68.0 in

## 2022-04-11 DIAGNOSIS — Z0000 Encounter for general adult medical examination without abnormal findings: Secondary | ICD-10-CM | POA: Diagnosis not present

## 2022-04-11 NOTE — Patient Instructions (Addendum)
Connor Dixon , Thank you for taking time to come for your Medicare Wellness Visit. I appreciate your ongoing commitment to your health goals. Please review the following plan we discussed and let me know if I can assist you in the future.   These are the goals we discussed:  Goals      Client will verbalize knowledge of diabetes self-management as evidenced by Hgb A1C <7 or as defined by provider.            This is a list of the screening recommended for you and due dates:  Health Maintenance  Topic Date Due   COVID-19 Vaccine (3 - 2023-24 season) 01/14/2022   Yearly kidney health urinalysis for diabetes  06/09/2022   Complete foot exam   06/09/2022   Eye exam for diabetics  10/07/2022   Yearly kidney function blood test for diabetes  12/08/2022   Medicare Annual Wellness Visit  04/12/2023   Pneumonia Vaccine  Completed   Flu Shot  Completed   Zoster (Shingles) Vaccine  Completed   HPV Vaccine  Aged Out   Immunization History  Administered Date(s) Administered   Fluad Quad(high Dose 65+) 01/11/2019, 02/18/2020, 03/17/2022   H1N1 06/05/2008   Influenza Split 02/22/2011, 02/16/2021   Influenza Whole 02/04/2008, 02/24/2009, 02/05/2010   Influenza, High Dose Seasonal PF 02/20/2015, 01/27/2016, 01/31/2017, 02/07/2018   Influenza,inj,Quad PF,6+ Mos 01/15/2013   Moderna Sars-Covid-2 Vaccination 05/30/2019, 06/27/2019   Pneumococcal Conjugate-13 04/02/2013   Pneumococcal Polysaccharide-23 01/09/2012, 06/09/2020   Td 01/09/2012   Zoster Recombinat (Shingrix) 08/16/2017, 10/17/2017   Zoster, Live 02/28/2014    Advanced directives: Yes  Conditions/risks identified: Yes; Type II Diabetes  Next appointment: Follow up in one year for your annual wellness visit.   Preventive Care 35 Years and Older, Male  Preventive care refers to lifestyle choices and visits with your health care provider that can promote health and wellness. What does preventive care include? A yearly physical  exam. This is also called an annual well check. Dental exams once or twice a year. Routine eye exams. Ask your health care provider how often you should have your eyes checked. Personal lifestyle choices, including: Daily care of your teeth and gums. Regular physical activity. Eating a healthy diet. Avoiding tobacco and drug use. Limiting alcohol use. Practicing safe sex. Taking low doses of aspirin every day. Taking vitamin and mineral supplements as recommended by your health care provider. What happens during an annual well check? The services and screenings done by your health care provider during your annual well check will depend on your age, overall health, lifestyle risk factors, and family history of disease. Counseling  Your health care provider may ask you questions about your: Alcohol use. Tobacco use. Drug use. Emotional well-being. Home and relationship well-being. Sexual activity. Eating habits. History of falls. Memory and ability to understand (cognition). Work and work Astronomer. Screening  You may have the following tests or measurements: Height, weight, and BMI. Blood pressure. Lipid and cholesterol levels. These may be checked every 5 years, or more frequently if you are over 37 years old. Skin check. Lung cancer screening. You may have this screening every year starting at age 42 if you have a 30-pack-year history of smoking and currently smoke or have quit within the past 15 years. Fecal occult blood test (FOBT) of the stool. You may have this test every year starting at age 75. Flexible sigmoidoscopy or colonoscopy. You may have a sigmoidoscopy every 5 years or a colonoscopy  every 10 years starting at age 4. Prostate cancer screening. Recommendations will vary depending on your family history and other risks. Hepatitis C blood test. Hepatitis B blood test. Sexually transmitted disease (STD) testing. Diabetes screening. This is done by checking your  blood sugar (glucose) after you have not eaten for a while (fasting). You may have this done every 1-3 years. Abdominal aortic aneurysm (AAA) screening. You may need this if you are a current or former smoker. Osteoporosis. You may be screened starting at age 70 if you are at high risk. Talk with your health care provider about your test results, treatment options, and if necessary, the need for more tests. Vaccines  Your health care provider may recommend certain vaccines, such as: Influenza vaccine. This is recommended every year. Tetanus, diphtheria, and acellular pertussis (Tdap, Td) vaccine. You may need a Td booster every 10 years. Zoster vaccine. You may need this after age 91. Pneumococcal 13-valent conjugate (PCV13) vaccine. One dose is recommended after age 66. Pneumococcal polysaccharide (PPSV23) vaccine. One dose is recommended after age 10. Talk to your health care provider about which screenings and vaccines you need and how often you need them. This information is not intended to replace advice given to you by your health care provider. Make sure you discuss any questions you have with your health care provider. Document Released: 05/29/2015 Document Revised: 01/20/2016 Document Reviewed: 03/03/2015 Elsevier Interactive Patient Education  2017 [employer].  Fall Prevention in the Home Falls can cause injuries. They can happen to people of all ages. There are many things you can do to make your home safe and to help prevent falls. What can I do on the outside of my home? Regularly fix the edges of walkways and driveways and fix any cracks. Remove anything that might make you trip as you walk through a door, such as a raised step or threshold. Trim any bushes or trees on the path to your home. Use bright outdoor lighting. Clear any walking paths of anything that might make someone trip, such as rocks or tools. Regularly check to see if handrails are loose or broken. Make sure  that both sides of any steps have handrails. Any raised decks and porches should have guardrails on the edges. Have any leaves, snow, or ice cleared regularly. Use sand or salt on walking paths during winter. Clean up any spills in your garage right away. This includes oil or grease spills. What can I do in the bathroom? Use night lights. Install grab bars by the toilet and in the tub and shower. Do not use towel bars as grab bars. Use non-skid mats or decals in the tub or shower. If you need to sit down in the shower, use a plastic, non-slip stool. Keep the floor dry. Clean up any water that spills on the floor as soon as it happens. Remove soap buildup in the tub or shower regularly. Attach bath mats securely with double-sided non-slip rug tape. Do not have throw rugs and other things on the floor that can make you trip. What can I do in the bedroom? Use night lights. Make sure that you have a light by your bed that is easy to reach. Do not use any sheets or blankets that are too big for your bed. They should not hang down onto the floor. Have a firm chair that has side arms. You can use this for support while you get dressed. Do not have throw rugs and other things on  the floor that can make you trip. What can I do in the kitchen? Clean up any spills right away. Avoid walking on wet floors. Keep items that you use a lot in easy-to-reach places. If you need to reach something above you, use a strong step stool that has a grab bar. Keep electrical cords out of the way. Do not use floor polish or wax that makes floors slippery. If you must use wax, use non-skid floor wax. Do not have throw rugs and other things on the floor that can make you trip. What can I do with my stairs? Do not leave any items on the stairs. Make sure that there are handrails on both sides of the stairs and use them. Fix handrails that are broken or loose. Make sure that handrails are as long as the  stairways. Check any carpeting to make sure that it is firmly attached to the stairs. Fix any carpet that is loose or worn. Avoid having throw rugs at the top or bottom of the stairs. If you do have throw rugs, attach them to the floor with carpet tape. Make sure that you have a light switch at the top of the stairs and the bottom of the stairs. If you do not have them, ask someone to add them for you. What else can I do to help prevent falls? Wear shoes that: Do not have high heels. Have rubber bottoms. Are comfortable and fit you well. Are closed at the toe. Do not wear sandals. If you use a stepladder: Make sure that it is fully opened. Do not climb a closed stepladder. Make sure that both sides of the stepladder are locked into place. Ask someone to hold it for you, if possible. Clearly mark and make sure that you can see: Any grab bars or handrails. First and last steps. Where the edge of each step is. Use tools that help you move around (mobility aids) if they are needed. These include: Canes. Walkers. Scooters. Crutches. Turn on the lights when you go into a dark area. Replace any light bulbs as soon as they burn out. Set up your furniture so you have a clear path. Avoid moving your furniture around. If any of your floors are uneven, fix them. If there are any pets around you, be aware of where they are. Review your medicines with your doctor. Some medicines can make you feel dizzy. This can increase your chance of falling. Ask your doctor what other things that you can do to help prevent falls. This information is not intended to replace advice given to you by your health care provider. Make sure you discuss any questions you have with your health care provider. Document Released: 02/26/2009 Document Revised: 10/08/2015 Document Reviewed: 06/06/2014 Elsevier Interactive Patient Education  2017 [employer].

## 2022-04-11 NOTE — Progress Notes (Signed)
Virtual Visit via Telephone Note  I connected with  Connor Dixon on 04/11/22 at  2:30 PM EST by telephone and verified that I am speaking with the correct person using two identifiers.  Location: Patient: Home Provider: [place] Persons participating in the virtual visit: [place]   I discussed the limitations, risks, security and privacy concerns of performing an evaluation and management service by telephone and the availability of in person appointments. The patient expressed understanding and agreed to proceed.  Interactive audio and video telecommunications were attempted between this nurse and patient, however failed, due to patient having technical difficulties OR patient did not have access to video capability.  We continued and completed visit with audio only.  Some vital signs may be absent or patient reported.   Sheral Flow, LPN  Subjective:   Connor Dixon is a 87 y.o. male who presents for Medicare Annual/Subsequent preventive examination.  Review of Systems     Cardiac Risk Factors include: advanced age (>33mn, >>77women);diabetes mellitus;dyslipidemia;hypertension;male gender;obesity (BMI >30kg/m2);sedentary lifestyle;family history of premature cardiovascular disease     Objective:    Today's Vitals   04/11/22 1436  Height: _0  (1.727 m)  PainSc: 0-No pain   Body mass index is 34.21 kg/m.     04/11/2022    2:40 PM 04/05/2021    8:27 AM 01/16/2020   12:14 PM 01/11/2019   10:26 AM 01/09/2018    8:31 AM 01/28/2016    5:31 PM 04/21/2015    9:09 AM  Advanced Directives  Does Patient Have a Medical Advance Directive? _1  Yes Yes  Type of AParamedicof [place]spectLiving will [place]lliottLiving will Living will;Healthcare Power of [place]Living will [place]ileyLiving will HLiscoLiving will   Does  patient want to make changes to medical advance directive?  Yes (ED - Information included in AVS) No - Patient declined   No - Patient declined   Copy of H[place]in Chart? No - copy requested No - copy requested No - copy requested No - copy requested No - copy requested Yes     Current Medications (verified) Outpatient Encounter Medications as of 04/11/2022  Medication Sig   Continuous Blood Gluc Receiver (DEXCOM G7 RECEIVER) DEVI 1 Act by Does not apply route daily.   Continuous Blood Gluc Sensor (DEXCOM G7 SENSOR) MISC 1 Act by Does not apply route daily.   insulin NPH-regular Human (70-30) 100 UNIT/ML injection 40 units QAM 25 units QPM   ACCU-CHEK GUIDE test strip TEST FOUR TIMES DAILY   allopurinol (ZYLOPRIM) 100 MG tablet TAKE 1 TABLET(100 MG) BY MOUTH DAILY   amoxicillin (AMOXIL) 500 MG capsule TAKE ONE CAPSULE BY MOUTH THREE TIMES DAILY. TAKE ONLY WHEN HAVING DENTAL PROCEDURES   blood glucose meter kit and supplies KIT Use to check blood sugar daily. DX: E11.9   calcitRIOL (ROCALTROL) 0.25 MCG capsule TAKE 1 CAPSULE(0.25 MCG) BY MOUTH EVERY OTHER DAY   carvedilol (COREG) 6.25 MG tablet TAKE 1 TABLET(6.25 MG) BY MOUTH TWICE DAILY WITH A MEAL   Continuous Blood Gluc Receiver (FREESTYLE LIBRE 2 READER) DEVI 1 Act by Does not apply route daily.   Continuous Blood Gluc Sensor (FREESTYLE LIBRE 2 SENSOR) MISC 1 Act by Does not apply route daily.   ELIQUIS 2.5 MG TABS tablet TAKE 1 TABLET(2.5 MG) BY MOUTH TWICE DAILY   furosemide (LASIX) 40 MG  tablet Take 2 tablets in the morning and 1 tablet in the afternoon.   Glucagon (GVOKE HYPOPEN 2-PACK) 1 MG/0.2ML SOAJ Inject 1 Act into the skin daily as needed.   Insulin Pen Needle (B-D ULTRAFINE III SHORT PEN) 31G X 8 MM MISC USE TO ADMINISTER INSULIN THREE TIMES DAILY   Insulin Syringe-Needle U-100 (BD INSULIN SYRINGE U/F) 31G X 5/16" 0.5 ML MISC USE AS DIRECTED FOUR TIMES DAILY   Lancets 33G MISC Use to test blood sugar  daily. DX: E11.9   loratadine (CLARITIN) 10 MG tablet Take 10 mg by mouth daily as needed for allergies.   losartan (COZAAR) 25 MG tablet TAKE 1 TABLET(25 MG) BY MOUTH DAILY   pravastatin (PRAVACHOL) 40 MG tablet TAKE 1 TABLET(40 MG) BY MOUTH DAILY   No facility-administered encounter medications on file as of 04/11/2022.    Allergies (verified) Oysters [shellfish allergy], Farxiga [dapagliflozin], and Apple juice   History: Past Medical History:  Diagnosis Date   Acute on chronic diastolic congestive heart failure (HCC)    Arthus phenomenon    Atrioventricular block, complete (HCC)    Cardiac pacemaker in situ 07/22/2010   Chronic kidney disease, stage 3 ([place])    Presumably from longstanding DM & HTN. Scr has gradually increased over the past 2 years (1.6 in 12/2010, 2 in 03/2011, and up to 2.1 in 01/2013)   DIABETES MELLITUS, TYPE I, ADULT ONSET 08/06/2007   DM (diabetes mellitus) ([place])    adult onset    Essential hypertension, benign 08/06/2007   Very good BP control   History of second degree heart block    HLD (hyperlipidemia)    HYPERLIPIDEMIA 08/06/2007   Hypopotassemia    Morbid obesity ([place])    Obesity    Other and unspecified angina pectoris    typical   Type I (juvenile type) diabetes mellitus without mention of complication, not stated as uncontrolled    (04/25/13 OV with Dr. Marval Regal) = Poorly controlled & is working  w/Dr. Crissie Sickles to improve this. Most recent Hgb A1c was 8.2% but [name] reports it was down to 7.6 last month.   Past Surgical History:  Procedure Laterality Date   CATARACT EXTRACTION  8/08   [place]   PPM GENERATOR CHANGEOUT N/A 02/13/2019   Procedure: PPM GENERATOR CHANGEOUT;  Surgeon: Evans Lance, MD;  Location: [facility];  Service: Cardiovascular;  Laterality: N/A;   PTVDP  12/11   PPM - [facility] [facility]   TOTAL HIP ARTHROPLASTY  04/05/06   right   Family History  Problem Relation Age of Onset   Asthma Father    Coronary artery  disease Father    Diabetes Father    Hyperlipidemia Father    Stroke Father 22   Coronary artery disease Mother    Asthma Mother    Heart attack Mother 26   Colon cancer Neg Hx        no definate prostate CA    Social History   Socioeconomic History   Marital status: Widowed    Spouse name: Not on file   Number of children: 2   Years of education: Not on file   Highest education level: Not on file  Occupational History   Occupation: retired  Tobacco Use   Smoking status: Never   Smokeless tobacco: Never  Vaping Use   Vaping Use: Never used  Substance and Sexual Activity   Alcohol use: No   Drug use: No   Sexual activity: Not Currently  Other Topics Concern   Not on file  Social History Narrative   [employer] BA, 2 Master's degrees. [place] - for certification Education specialist.    Retired Tree surgeon.   Active - travels.   Married - 1961; 2 sons ([place]) and 5 grandchildren.        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    Social Determinants of Health   Financial Resource Strain: Low Risk  (04/11/2022)   Overall Financial Resource Strain (CARDIA)    Difficulty of Paying Living Expenses: Not hard at all  Food Insecurity: No Food Insecurity (04/11/2022)   Hunger Vital Sign    Worried About Running Out of Food in the Last Year: Never true    Ran Out of Food in the Last Year: Never true  Transportation Needs: No Transportation Needs (04/11/2022)   PRAPARE - Hydrologist (Medical): No    Lack of Transportation (Non-Medical): No  Physical Activity: Inactive (04/11/2022)   Exercise Vital Sign    Days of Exercise per Week: 0 days    Minutes of Exercise per Session: 0 min  Stress: No Stress Concern Present (04/11/2022)   [place]    Feeling of Stress : Not at all  Social Connections: Moderately Isolated (04/11/2022)   Social Connection and  Isolation Panel [NHANES]    Frequency of Communication with Friends and Family: Twice a week    Frequency of Social Gatherings with Friends and Family: Twice a week    Attends Religious Services: 1 to 4 times per year    Active Member of [employer] or Organizations: No    Attends Archivist Meetings: Never    Marital Status: Widowed    Tobacco Counseling Counseling given: Not Answered   Clinical Intake:  Pre-visit preparation completed: Yes  Pain : No/denies pain Pain Score: 0-No pain     BMI - recorded: 34.22 (12/07/2021) Nutritional Risks: None Diabetes: No  How often do you need to have someone help you when you read instructions, pamphlets, or other written materials from your doctor or pharmacy?: 1 - Never What is the last grade level you completed in school?: Bachelor's Degree; 2 Master's Degree; 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  No  Does the patient have any non-healing wounds?  No  Has the patient had any unintentional weight loss or weight gain?  No   Diabetes:  Is the patient diabetic?  Yes  If diabetic, was a CBG obtained today?  No  Did the patient bring in their glucometer from home?  No  How often do you monitor your CBG's? Dexcom Glucose Monitor.   Financial Strains and Diabetes Management:  Are you having any financial strains with the device, your supplies or your medication? No .  Does the patient want to be seen by Chronic Care Management for management of their diabetes?  No  Would the patient like to be referred to a Nutritionist or for Diabetic Management?  No   Diabetic Exams:  Diabetic Eye Exam: Completed 10/06/2021 Diabetic Foot Exam: Completed 06/09/2021   Interpreter Needed?: No  Information entered by :: Lisette Abu, LPN.   Activities of Daily Living    04/11/2022    2:45 PM  In your present state of health, do you have any difficulty  performing the following activities:  Hearing? 0  Vision? 0  Difficulty concentrating or making decisions? 0  Walking or climbing stairs? 0  Dressing or bathing? 0  Doing errands, shopping? 0  Preparing Food and eating ? N  Using the Toilet? N  In the past six months, have you accidently leaked urine? N  Do you have problems with loss of bowel control? N  Managing your Medications? N  Managing your Finances? N  Housekeeping or managing your Housekeeping? N    Patient Care Team: Janith Lima, MD as PCP - General (Internal Medicine) Evans Lance, MD (Cardiology) Elsie Saas, MD (Orthopedic Surgery) Shon Hough, MD (Ophthalmology)  Indicate any recent Medical Services you may have received from other than [facility] providers in the past year (date may be approximate).     Assessment:   This is a routine wellness examination for [place].  Hearing/Vision screen Hearing Screening - Comments:: Denies hearing difficulties   Vision Screening - Comments:: Wears rx glasses - up to date with routine eye exams with [facility]   Dietary issues and exercise activities discussed: Current Exercise Habits: The patient does not participate in regular exercise at present, Exercise limited by: cardiac condition(s)   Goals Addressed             This Visit's Progress    Client will verbalize knowledge of diabetes self-management as evidenced by Hgb A1C <7 or as defined by provider.            Depression Screen    04/11/2022    2:44 PM 04/05/2021    8:31 AM 04/05/2021    8:24 AM 01/16/2020   12:12 PM 01/11/2019    8:22 AM 11/26/2018    4:35 PM 01/09/2018    8:32 AM  PHQ 2/9 Scores  PHQ - 2 Score 0 0 0 0 0 0 1  PHQ- 9 Score       1    Fall Risk    04/11/2022    2:41 PM 04/05/2021    8:28 AM 01/16/2020   12:14 PM 05/21/2019    8:29 AM 01/11/2019    8:22 AM  [place] in the past year? 0 0 0 0 1  Number falls in past yr: 0 0 0 0 1  Injury with Fall? 0  0 0 0 0  Risk for fall due to : No Fall Risks  No Fall Risks Impaired balance/gait Impaired balance/gait;Impaired mobility  Follow up Falls prevention discussed Falls evaluation completed Falls evaluation completed Falls evaluation completed Falls prevention discussed    FALL RISK PREVENTION PERTAINING TO THE HOME:  Any stairs in or around the home? Yes  If so, are there any without handrails? No  Home free of loose throw rugs in walkways, pet beds, electrical cords, etc? Yes  Adequate lighting in your home to reduce risk of falls? Yes   ASSISTIVE DEVICES UTILIZED TO PREVENT FALLS:  Life alert? Yes  Use of a cane, walker or w/c? No  Grab bars in the bathroom? Yes  Shower chair or bench in shower? Yes  Elevated toilet seat or a handicapped toilet? Yes   TIMED UP AND GO:  Was the test performed? No . Phone Visit   Cognitive Function:    01/09/2018    5:04 PM  MMSE - Mini Mental State Exam  Orientation to time 5  Orientation to Place 5  Registration 3  Attention/ Calculation 5  Recall 2  Language- name 2 objects 2  Language- repeat 1  Language- follow 3 step command 3  Language- read & follow direction 1  Write a sentence 1  Copy design 1  Total score 29        04/11/2022    2:50 PM 01/16/2020   12:16 PM  6CIT Screen  What Year? 0 points 0 points  What month? 0 points 0 points  What time? 0 points 0 points  Count back from 20 0 points 0 points  Months in reverse 0 points 0 points  Repeat phrase 0 points 0 points  Total Score 0 points 0 points    Immunizations Immunization History  Administered Date(s) Administered   Fluad Quad(high Dose 65+) 01/11/2019, 02/18/2020, 03/17/2022   H1N1 06/05/2008   Influenza Split 02/22/2011, 02/16/2021   Influenza Whole 02/04/2008, 02/24/2009, 02/05/2010   Influenza, High Dose Seasonal PF 02/20/2015, 01/27/2016, 01/31/2017, 02/07/2018   Influenza,inj,Quad PF,6+ Mos 01/15/2013   Moderna Sars-Covid-2 Vaccination 05/30/2019,  06/27/2019   Pneumococcal Conjugate-13 04/02/2013   Pneumococcal Polysaccharide-23 01/09/2012, 06/09/2020   Td 01/09/2012   Zoster Recombinat (Shingrix) 08/16/2017, 10/17/2017   Zoster, Live 02/28/2014    TDAP status: Due, Education has been provided regarding the importance of this vaccine. Advised may receive this vaccine at local pharmacy or Health Dept. Aware to provide a copy of the vaccination record if obtained from local pharmacy or Health Dept. Verbalized acceptance and understanding.  Flu Vaccine status: Up to date  Pneumococcal vaccine status: Up to date  Covid-19 vaccine status: Completed vaccines  Qualifies for Shingles Vaccine? Yes   Zostavax completed Yes   Shingrix Completed?: Yes  Screening Tests Health Maintenance  Topic Date Due   COVID-19 Vaccine (3 - 2023-24 season) 01/14/2022   Diabetic kidney evaluation - Urine ACR  06/09/2022   FOOT EXAM  06/09/2022   OPHTHALMOLOGY EXAM  10/07/2022   Diabetic kidney evaluation - GFR measurement  12/08/2022   Medicare Annual Wellness (AWV)  04/12/2023   Pneumonia Vaccine 63+ Years old  Completed   INFLUENZA VACCINE  Completed   Zoster Vaccines- Shingrix  Completed   HPV VACCINES  Aged Out    Health Maintenance  Health Maintenance Due  Topic Date Due   COVID-19 Vaccine (3 - 2023-24 season) 01/14/2022    Colorectal cancer screening: No longer required.   Lung Cancer Screening: (Low Dose CT Chest recommended if Age 42-80 years, 30 pack-year currently smoking OR have quit w/in 15years.) does not qualify.   Lung Cancer Screening Referral: no  Additional Screening:  Hepatitis C Screening: does not qualify; Completed no  Vision Screening: Recommended annual ophthalmology exams for early detection of glaucoma and other disorders of the eye. Is the patient up to date with their annual eye exam?  Yes  Who is the provider or what is the name of the office in which the patient attends annual eye exams? [facility]  Ophthalmology If pt is not established with a provider, would they like to be referred to a provider to establish care? No .   Dental Screening: Recommended annual dental exams for proper oral hygiene  Community Resource Referral / Chronic Care Management: CRR required this visit?  No   CCM required this visit?  No      Plan:     I have personally reviewed and noted the following in the patient's chart:   Medical and social history Use of alcohol, tobacco or illicit drugs  Current medications and supplements including opioid prescriptions. Patient is not currently taking opioid prescriptions. Functional ability and status Nutritional  status Physical activity Advanced directives List of other physicians Hospitalizations, surgeries, and ER visits in previous 12 months Vitals Screenings to include cognitive, depression, and falls Referrals and appointments  In addition, I have reviewed and discussed with patient certain preventive protocols, quality metrics, and best practice recommendations. A written personalized care plan for preventive services as well as general preventive health recommendations were provided to patient.     Sheral Flow, LPN   [id number]/[id number]   Nurse Notes: N/A

## 2022-04-18 ENCOUNTER — Encounter: Payer: Self-pay | Admitting: Internal Medicine

## 2022-04-18 ENCOUNTER — Telehealth: Payer: Self-pay | Admitting: Licensed Clinical Social Worker

## 2022-04-18 DIAGNOSIS — Z794 Long term (current) use of insulin: Secondary | ICD-10-CM

## 2022-04-18 NOTE — Patient Outreach (Signed)
  Care Coordination   04/18/2022 Name: Connor Dixon MRN: [id number] DOB: 06/14/1934   Care Coordination Outreach Attempts:  An unsuccessful telephone outreach was attempted today to offer the patient information about available care coordination services as a benefit of their health plan.   Follow Up Plan:  Additional outreach attempts will be made to offer the patient care coordination information and services.   Encounter Outcome:  No Answer   Care Coordination Interventions:  No, not indicated    Sammuel Hines, LCSW Social Work Care Coordination  [facility]ie [employer] [employer] [phone]

## 2022-04-22 MED ORDER — DEXCOM G7 RECEIVER DEVI: 1.0000 | Freq: Every day | 1 refills | Status: DC

## 2022-04-22 MED ORDER — DEXCOM G7 SENSOR MISC: 1.0000 | Freq: Every day | 1 refills | Status: DC

## 2022-04-25 ENCOUNTER — Telehealth: Payer: Self-pay

## 2022-04-25 NOTE — Telephone Encounter (Signed)
Receiver Key: [id number]  Sensor Key: [id number]

## 2022-04-26 DIAGNOSIS — E113393 Type 2 diabetes mellitus with moderate nonproliferative diabetic retinopathy without macular edema, bilateral: Secondary | ICD-10-CM | POA: Diagnosis not present

## 2022-04-26 NOTE — Telephone Encounter (Signed)
Both PA's are denied.  No reason given.

## 2022-05-04 ENCOUNTER — Encounter: Payer: Self-pay | Admitting: Internal Medicine

## 2022-05-20 ENCOUNTER — Ambulatory Visit (INDEPENDENT_AMBULATORY_CARE_PROVIDER_SITE_OTHER): Payer: Medicare PPO

## 2022-05-20 DIAGNOSIS — I495 Sick sinus syndrome: Secondary | ICD-10-CM | POA: Diagnosis not present

## 2022-05-20 LAB — CUP PACEART REMOTE DEVICE CHECK
Battery Remaining Longevity: 59 mo
Battery Remaining Percentage: 60 %
Battery Voltage: 2.98 V
Brady Statistic AP VP Percent: 3 %
Brady Statistic AP VS Percent: 1 %
Brady Statistic AS VP Percent: 97 %
Brady Statistic AS VS Percent: 1 %
Brady Statistic RA Percent Paced: 2.5 %
Brady Statistic RV Percent Paced: 99 %
Date Time Interrogation Session: 20240105051204
Implantable Lead Connection Status: 753985
Implantable Lead Connection Status: 753985
Implantable Lead Implant Date: 20111202
Implantable Lead Implant Date: 20111202
Implantable Lead Location: 753859
Implantable Lead Location: 753860
Implantable Pulse Generator Implant Date: 20200930
Lead Channel Impedance Value: 380 Ohm
Lead Channel Impedance Value: 480 Ohm
Lead Channel Pacing Threshold Amplitude: 0.5 V
Lead Channel Pacing Threshold Amplitude: 1.625 V
Lead Channel Pacing Threshold Pulse Width: 0.4 ms
Lead Channel Pacing Threshold Pulse Width: 0.8 ms
Lead Channel Sensing Intrinsic Amplitude: 1.7 mV
Lead Channel Sensing Intrinsic Amplitude: 9.3 mV
Lead Channel Setting Pacing Amplitude: 1.875
Lead Channel Setting Pacing Amplitude: 2 V
Lead Channel Setting Pacing Pulse Width: 0.8 ms
Lead Channel Setting Sensing Sensitivity: 4 mV
Pulse Gen Model: 2272
Pulse Gen Serial Number: 9156495

## 2022-05-24 ENCOUNTER — Telehealth: Payer: Self-pay | Admitting: Internal Medicine

## 2022-05-24 NOTE — Telephone Encounter (Signed)
Forms placed on PCP's desk for signature.

## 2022-05-24 NOTE — Telephone Encounter (Signed)
Pt is requesting his Continuous Blood Gluc Sensor (DEXCOM G7 SENSOR) MISC & Continuous Blood Gluc Receiver ([place]) DEVI   RX's be sent to [facility] of [facility].

## 2022-05-26 ENCOUNTER — Other Ambulatory Visit: Payer: Self-pay | Admitting: Internal Medicine

## 2022-05-26 DIAGNOSIS — I10 Essential (primary) hypertension: Secondary | ICD-10-CM

## 2022-05-27 NOTE — Telephone Encounter (Signed)
Forms have been faxed back to [facility]

## 2022-05-30 ENCOUNTER — Other Ambulatory Visit: Payer: Self-pay | Admitting: Internal Medicine

## 2022-05-30 DIAGNOSIS — E785 Hyperlipidemia, unspecified: Secondary | ICD-10-CM

## 2022-05-31 DIAGNOSIS — E119 Type 2 diabetes mellitus without complications: Secondary | ICD-10-CM | POA: Diagnosis not present

## 2022-06-02 ENCOUNTER — Other Ambulatory Visit: Payer: Self-pay | Admitting: Internal Medicine

## 2022-06-02 DIAGNOSIS — E559 Vitamin D deficiency, unspecified: Secondary | ICD-10-CM

## 2022-06-07 NOTE — Progress Notes (Signed)
Remote pacemaker transmission.   

## 2022-06-14 ENCOUNTER — Telehealth: Payer: Self-pay | Admitting: Internal Medicine

## 2022-06-14 ENCOUNTER — Other Ambulatory Visit: Payer: Self-pay | Admitting: Internal Medicine

## 2022-06-14 DIAGNOSIS — E119 Type 2 diabetes mellitus without complications: Secondary | ICD-10-CM

## 2022-06-14 MED ORDER — INSULIN NPH ISOPHANE & REGULAR (70-30) 100 UNIT/ML ~~LOC~~ SUSP: SUBCUTANEOUS | 1 refills | Status: DC

## 2022-06-14 NOTE — Telephone Encounter (Signed)
Patient called needs a refill on his insulin (70-30) pen and would like it sent to the [facility] on [address]. If any questions patient can be reached at [phone].

## 2022-06-14 NOTE — Telephone Encounter (Signed)
Rx has been faxed to [facility]

## 2022-06-15 ENCOUNTER — Other Ambulatory Visit: Payer: Self-pay | Admitting: Internal Medicine

## 2022-06-15 DIAGNOSIS — E0821 Diabetes mellitus due to underlying condition with diabetic nephropathy: Secondary | ICD-10-CM

## 2022-06-15 DIAGNOSIS — E119 Type 2 diabetes mellitus without complications: Secondary | ICD-10-CM

## 2022-06-15 DIAGNOSIS — E0840 Diabetes mellitus due to underlying condition with diabetic neuropathy, unspecified: Secondary | ICD-10-CM

## 2022-06-15 MED ORDER — HUMULIN 70/30 KWIKPEN (70-30) 100 UNIT/ML ~~LOC~~ SUPN: PEN_INJECTOR | SUBCUTANEOUS | 1 refills | Status: DC

## 2022-06-15 NOTE — Telephone Encounter (Signed)
Received updated Rx from PCP and faxed to [facility].

## 2022-06-15 NOTE — Telephone Encounter (Signed)
Pt came in today upset claiming we have sent vials instead of pens for his insulin RX.   Pt only wants 70-30 pens for his insulin in the future.

## 2022-06-28 ENCOUNTER — Telehealth: Payer: Self-pay | Admitting: *Deleted

## 2022-06-28 NOTE — Progress Notes (Signed)
  Care Coordination   Note   06/28/2022 Name: Connor Dixon MRN: [id number] DOB: 01-20-1935  KRIS NO is a 88 y.o. year old male who sees Janith Lima, MD for primary care. I reached out to Vick Frees by phone today to offer care coordination services.  Mr. Dia was given information about Care Coordination services today including:   The Care Coordination services include support from the care team which includes your Nurse Coordinator, Clinical Social Worker, or Pharmacist.  The Care Coordination team is here to help remove barriers to the health concerns and goals most important to you. Care Coordination services are voluntary, and the patient may decline or stop services at any time by request to their care team member.   Care Coordination Consent Status: Patient agreed to services and verbal consent obtained.   Follow up plan:  Telephone appointment with care coordination team member scheduled for:  07/05/2022  Encounter Outcome:  Pt. Scheduled  Julian Hy, [place] Direct Dial: [phone]

## 2022-07-05 ENCOUNTER — Ambulatory Visit: Payer: Self-pay

## 2022-07-05 NOTE — Patient Outreach (Signed)
  Care Coordination   Initial Visit Note   07/05/2022 Name: Connor Dixon MRN: [id number] DOB: [birth year]  Connor Dixon is a 88 y.o. year old male who sees Janith Lima, MD for primary care. I spoke with  Vick Frees by phone today.  What matters to the patients health and wellness today?  Patient reports history of diabetes. He is receptive to [employer] education to help support in his diabetes management.    Goals Addressed   None     SDOH assessments and interventions completed:  Yes  SDOH Interventions Today    Flowsheet Row Most Recent Value  SDOH Interventions   Food Insecurity Interventions Intervention Not Indicated  Housing Interventions Intervention Not Indicated  Transportation Interventions Intervention Not Indicated  Utilities Interventions Intervention Not Indicated     Care Coordination Interventions:  Yes, provided   Follow up plan: Follow up call scheduled for 08/02/22    Encounter Outcome:  Pt. Visit Completed   Thea Silversmith, RN, MSN, BSN, CCM Care Management Coordinator [phone]

## 2022-07-05 NOTE — Patient Instructions (Addendum)
Visit Information  Thank you for taking time to visit with me today. Please don't hesitate to contact me if I can be of assistance to you.   Following are the goals we discussed today:   Goals Addressed             This Visit's Progress    Patient Stated       Interventions Today    Flowsheet Row Most Recent Value  Chronic Disease   Chronic disease during today's visit Diabetes  General Interventions   General Interventions Discussed/Reviewed General Interventions Discussed, Doctor Visits  Doctor Visits Discussed/Reviewed Doctor Visits Discussed  [patient to call to schedule 6 month follow up]  Education Interventions   Education Provided Provided Education  [discussed target blood sugar 80-130 before meals and <180 about 2 hour after meal or as recommended by provider. encouraged patient to contact provider to schedule 6 month follow up.]  Pharmacy Interventions   Pharmacy Dicussed/Reviewed Pharmacy Topics Discussed, Medication Adherence, Affording Medications            Our next appointment is by telephone on 08/02/22 at 10 am  Please call the care guide team at [phone] if you need to cancel or reschedule your appointment.   If you are experiencing a Mental Health or [place] or need someone to talk to, please call the Suicide and Crisis Lifeline: [place], RN, MSN, BSN, [place] Coordinator [phone]     Diabetes Mellitus Action Plan Following a diabetes action plan is a way for you to manage your diabetes (diabetes mellitus) symptoms. The plan is color-coded to help you understand what actions you need to take based on any symptoms you are having. If you have symptoms in the red zone, you need medical care right away. If you have symptoms in the yellow zone, you are having problems. If you have symptoms in the green zone, you are doing well. Learning about and understanding diabetes can take time. Follow the plan that you develop  with your health care provider. Know the target range for your blood sugar (glucose) level, and review your treatment plan with your health care provider at each visit. The target range for my blood sugar level is __________________________ mg/dL. Red zone Get medical help right away if you have any of the following symptoms: A blood sugar test result that is below 54 mg/dL (3 mmol/L). A blood sugar test result that is at or above 240 mg/dL (13.3 mmol/L) for 2 days in a row. Confusion or trouble thinking clearly. Difficulty breathing. Sickness or a fever for 2 or more days that is not getting better. Moderate or large ketone levels in your urine. Feeling tired or having no energy. If you have any red zone symptoms, do not wait to see if the symptoms will go away. Get medical help right away. Call your local emergency services (911 in the [place] Do not drive yourself to the hospital. If you have severely low blood sugar (severe hypoglycemia) and you cannot eat or drink, you may need glucagon. Make sure a family member or close friend knows how to check your blood sugar and how to give you glucagon. You may need to be treated in a hospital for this condition. Yellow zone If you have any of the following symptoms, your diabetes is not under control and you may need to make some changes: A blood sugar test result that is at or above 240 mg/dL (13.3 mmol/L) for 2  days in a row. Blood sugar test results that are below 70 mg/dL (3.9 mmol/L). Other symptoms of hypoglycemia, such as: Shaking or feeling light-headed. Confusion or irritability. Feeling hungry. Having a fast heartbeat. If you have any yellow zone symptoms: Treat your hypoglycemia by eating or drinking 15 grams of a rapid-acting carbohydrate. Follow the 15:15 rule: Take 15 grams of a rapid-acting carbohydrate, such as: 1 tube of glucose gel. 4 glucose pills. 4 oz (120 mL) of fruit juice. 4 oz (120 mL) of regular (not diet)  soda. Check your blood sugar 15 minutes after you take the carbohydrate. If the repeat blood sugar test is still at or below 70 mg/dL (3.9 mmol/L), take 15 grams of a carbohydrate again. If your blood sugar does not increase above 70 mg/dL (3.9 mmol/L) after 3 tries, get medical help right away. After your blood sugar returns to normal, eat a meal or a snack within 1 hour. Keep taking your daily medicines as told by your health care provider. Check your blood sugar more often than you normally would. Write down your results. Call your health care provider if you have trouble keeping your blood sugar in your target range.  Green zone These signs mean you are doing well and you can continue what you are doing to manage your diabetes: Your blood sugar is within your personal target range. For most people, a blood sugar level before a meal (preprandial) should be 80-130 mg/dL (4.4-7.2 mmol/L). You feel well, and you are able to do daily activities. If you are in the green zone, continue to manage your diabetes as told by your health care provider. To do this: Eat a healthy diet. Exercise regularly. Check your blood sugar as told by your health care provider. Take your medicines as told by your health care provider.  Where to find more information American Diabetes Association (ADA): diabetes.org Association of Diabetes Care & Education Specialists (ADCES): diabeteseducator.org Summary Following a diabetes action plan is a way for you to manage your diabetes symptoms. The plan is color-coded to help you understand what actions you need to take based on any symptoms you are having. Follow the plan that you develop with your health care provider. Make sure you know your personal target blood sugar level. Review your treatment plan with your health care provider at each visit. This information is not intended to replace advice given to you by your health care provider. Make sure you discuss any  questions you have with your health care provider. Document Revised: 11/07/2019 Document Reviewed: 11/07/2019 Elsevier Patient Education  [place].  Diabetes Mellitus and Nutrition, Adult When you have diabetes, or diabetes mellitus, it is very important to have healthy eating habits because your blood sugar (glucose) levels are greatly affected by what you eat and drink. Eating healthy foods in the right amounts, at about the same times every day, can help you: Manage your blood glucose. Lower your risk of heart disease. Improve your blood pressure. Reach or maintain a healthy weight. What can affect my meal plan? Every person with diabetes is different, and each person has different needs for a meal plan. Your health care provider may recommend that you work with a dietitian to make a meal plan that is best for you. Your meal plan may vary depending on factors such as: The calories you need. The medicines you take. Your weight. Your blood glucose, blood pressure, and cholesterol levels. Your activity level. Other health conditions you have, such  as heart or kidney disease. How do carbohydrates affect me? Carbohydrates, also called carbs, affect your blood glucose level more than any other type of food. Eating carbs raises the amount of glucose in your blood. It is important to know how many carbs you can safely have in each meal. This is different for every person. Your dietitian can help you calculate how many carbs you should have at each meal and for each snack. How does alcohol affect me? Alcohol can cause a decrease in blood glucose (hypoglycemia), especially if you use insulin or take certain diabetes medicines by mouth. Hypoglycemia can be a life-threatening condition. Symptoms of hypoglycemia, such as sleepiness, dizziness, and confusion, are similar to symptoms of having too much alcohol. Do not drink alcohol if: Your health care provider tells you not to drink. You are  pregnant, may be pregnant, or are planning to become pregnant. If you drink alcohol: Limit how much you have to: 0-1 drink a day for women. 0-2 drinks a day for men. Know how much alcohol is in your drink. In the [place] one drink equals one 12 oz bottle of beer (355 mL), one 5 oz glass of wine (148 mL), or one 1 oz glass of hard liquor (44 mL). Keep yourself hydrated with water, diet soda, or unsweetened iced tea. Keep in mind that regular soda, juice, and other mixers may contain a lot of sugar and must be counted as carbs. What are tips for following this plan?  Reading food labels Start by checking the serving size on the Nutrition Facts label of packaged foods and drinks. The number of calories and the amount of carbs, fats, and other nutrients listed on the label are based on one serving of the item. Many items contain more than one serving per package. Check the total grams (g) of carbs in one serving. Check the number of grams of saturated fats and trans fats in one serving. Choose foods that have a low amount or none of these fats. Check the number of milligrams (mg) of salt (sodium) in one serving. Most people should limit total sodium intake to less than 2,300 mg per day. Always check the nutrition information of foods labeled as "low-fat" or "nonfat." These foods may be higher in added sugar or refined carbs and should be avoided. Talk to your dietitian to identify your daily goals for nutrients listed on the label. Shopping Avoid buying canned, pre-made, or processed foods. These foods tend to be high in fat, sodium, and added sugar. Shop around the outside edge of the grocery store. This is where you will most often find fresh fruits and vegetables, bulk grains, fresh meats, and fresh dairy products. Cooking Use low-heat cooking methods, such as baking, instead of high-heat cooking methods, such as deep frying. Cook using healthy oils, such as olive, canola, or sunflower oil. Avoid  cooking with butter, cream, or high-fat meats. Meal planning Eat meals and snacks regularly, preferably at the same times every day. Avoid going long periods of time without eating. Eat foods that are high in fiber, such as fresh fruits, vegetables, beans, and whole grains. Eat 4-6 oz (112-168 g) of lean protein each day, such as lean meat, chicken, fish, eggs, or tofu. One ounce (oz) (28 g) of lean protein is equal to: 1 oz (28 g) of meat, chicken, or fish. 1 egg.  cup (62 g) of tofu. Eat some foods each day that contain healthy fats, such as avocado, nuts, seeds, and fish.  What foods should I eat? Fruits Berries. Apples. Oranges. Peaches. Apricots. Plums. Grapes. Mangoes. Papayas. Pomegranates. Kiwi. Cherries. Vegetables Leafy greens, including lettuce, spinach, kale, chard, collard greens, mustard greens, and cabbage. Beets. Cauliflower. Broccoli. Carrots. Green beans. Tomatoes. Peppers. Onions. Cucumbers. Brussels sprouts. Grains Whole grains, such as whole-wheat or whole-grain bread, crackers, tortillas, cereal, and pasta. Unsweetened oatmeal. Quinoa. Brown or wild rice. Meats and other proteins Seafood. Poultry without skin. Lean cuts of poultry and beef. Tofu. Nuts. Seeds. Dairy Low-fat or fat-free dairy products such as milk, yogurt, and cheese. The items listed above may not be a complete list of foods and beverages you can eat and drink. Contact a dietitian for more information. What foods should I avoid? Fruits Fruits canned with syrup. Vegetables Canned vegetables. Frozen vegetables with butter or cream sauce. Grains Refined white flour and flour products such as bread, pasta, snack foods, and cereals. Avoid all processed foods. Meats and other proteins Fatty cuts of meat. Poultry with skin. Breaded or fried meats. Processed meat. Avoid saturated fats. Dairy Full-fat yogurt, cheese, or milk. Beverages Sweetened drinks, such as soda or iced tea. The items listed above  may not be a complete list of foods and beverages you should avoid. Contact a dietitian for more information. Questions to ask a health care provider Do I need to meet with a certified diabetes care and education specialist? Do I need to meet with a dietitian? What number can I call if I have questions? When are the best times to check my blood glucose? Where to find more information: American Diabetes Association: diabetes.org Academy of Nutrition and Dietetics: eatright.[employer] of Diabetes and Digestive and Kidney Diseases: [employer] Association of Diabetes Care & Education Specialists: diabeteseducator.org Summary It is important to have healthy eating habits because your blood sugar (glucose) levels are greatly affected by what you eat and drink. It is important to use alcohol carefully. A healthy meal plan will help you manage your blood glucose and lower your risk of heart disease. Your health care provider may recommend that you work with a dietitian to make a meal plan that is best for you. This information is not intended to replace advice given to you by your health care provider. Make sure you discuss any questions you have with your health care provider. Document Revised: 12/04/2019 Document Reviewed: 12/04/2019 Elsevier Patient Education  [place].

## 2022-07-16 ENCOUNTER — Other Ambulatory Visit: Payer: Self-pay | Admitting: Internal Medicine

## 2022-07-22 ENCOUNTER — Other Ambulatory Visit: Payer: Self-pay | Admitting: Internal Medicine

## 2022-07-22 ENCOUNTER — Telehealth: Payer: Self-pay | Admitting: Internal Medicine

## 2022-07-22 DIAGNOSIS — E119 Type 2 diabetes mellitus without complications: Secondary | ICD-10-CM

## 2022-07-22 MED ORDER — "INSULIN SYRINGE 31G X 5/16"" 0.5 ML MISC": 1 refills | Status: DC

## 2022-07-22 NOTE — Telephone Encounter (Signed)
Patient states that [facility] said they have not received the RX for Insulin Syringe-Needle U-100 (INSULIN SYRINGE .5CC/31GX5/16") 31G X 5/16" 0.5 ML MISC  and he will be out on Sunday. He needs the RX sent in as soon as possible to [place], [address] AT [address]   Please call patient once this has been sent to the pharmacy at [phone]

## 2022-08-04 ENCOUNTER — Encounter: Payer: Self-pay | Admitting: Internal Medicine

## 2022-08-04 ENCOUNTER — Ambulatory Visit: Payer: Medicare PPO | Admitting: Internal Medicine

## 2022-08-04 VITALS — BP 112/68 | HR 71 | Temp 97.9°F | Ht 68.0 in | Wt 210.0 lb

## 2022-08-04 DIAGNOSIS — I480 Paroxysmal atrial fibrillation: Secondary | ICD-10-CM | POA: Diagnosis not present

## 2022-08-04 DIAGNOSIS — Z794 Long term (current) use of insulin: Secondary | ICD-10-CM

## 2022-08-04 DIAGNOSIS — R0609 Other forms of dyspnea: Secondary | ICD-10-CM | POA: Insufficient documentation

## 2022-08-04 DIAGNOSIS — I10 Essential (primary) hypertension: Secondary | ICD-10-CM | POA: Diagnosis not present

## 2022-08-04 DIAGNOSIS — Z0001 Encounter for general adult medical examination with abnormal findings: Secondary | ICD-10-CM | POA: Diagnosis not present

## 2022-08-04 DIAGNOSIS — N1832 Chronic kidney disease, stage 3b: Secondary | ICD-10-CM | POA: Diagnosis not present

## 2022-08-04 DIAGNOSIS — E785 Hyperlipidemia, unspecified: Secondary | ICD-10-CM | POA: Diagnosis not present

## 2022-08-04 DIAGNOSIS — E119 Type 2 diabetes mellitus without complications: Secondary | ICD-10-CM

## 2022-08-04 DIAGNOSIS — E1140 Type 2 diabetes mellitus with diabetic neuropathy, unspecified: Secondary | ICD-10-CM | POA: Diagnosis not present

## 2022-08-04 DIAGNOSIS — E1121 Type 2 diabetes mellitus with diabetic nephropathy: Secondary | ICD-10-CM

## 2022-08-04 DIAGNOSIS — E0840 Diabetes mellitus due to underlying condition with diabetic neuropathy, unspecified: Secondary | ICD-10-CM

## 2022-08-04 LAB — CBC WITH DIFFERENTIAL/PLATELET
Basophils Absolute: 0 10*3/uL (ref 0.0–0.1)
Basophils Relative: 0.9 % (ref 0.0–3.0)
Eosinophils Absolute: 0.3 10*3/uL (ref 0.0–0.7)
Eosinophils Relative: 5.6 % — ABNORMAL HIGH (ref 0.0–5.0)
HCT: 42.7 % (ref 39.0–52.0)
Hemoglobin: 14.4 g/dL (ref 13.0–17.0)
Lymphocytes Relative: 23.9 % (ref 12.0–46.0)
Lymphs Abs: 1.1 10*3/uL (ref 0.7–4.0)
MCHC: 33.7 g/dL (ref 30.0–36.0)
MCV: 94.6 fl (ref 78.0–100.0)
Monocytes Absolute: 0.6 10*3/uL (ref 0.1–1.0)
Monocytes Relative: 13.9 % — ABNORMAL HIGH (ref 3.0–12.0)
Neutro Abs: 2.6 10*3/uL (ref 1.4–7.7)
Neutrophils Relative %: 55.7 % (ref 43.0–77.0)
Platelets: 183 10*3/uL (ref 150.0–400.0)
RBC: 4.52 Mil/uL (ref 4.22–5.81)
RDW: 14.2 % (ref 11.5–15.5)
WBC: 4.7 10*3/uL (ref 4.0–10.5)

## 2022-08-04 LAB — URINALYSIS, ROUTINE W REFLEX MICROSCOPIC
Bilirubin Urine: NEGATIVE
Hgb urine dipstick: NEGATIVE
Ketones, ur: NEGATIVE
Leukocytes,Ua: NEGATIVE
Nitrite: NEGATIVE
Specific Gravity, Urine: 1.02 (ref 1.000–1.030)
Total Protein, Urine: 30 — AB
Urine Glucose: NEGATIVE
Urobilinogen, UA: 2 — AB (ref 0.0–1.0)
pH: 6 (ref 5.0–8.0)

## 2022-08-04 LAB — BASIC METABOLIC PANEL
BUN: 40 mg/dL — ABNORMAL HIGH (ref 6–23)
CO2: 32 mEq/L (ref 19–32)
Calcium: 9.3 mg/dL (ref 8.4–10.5)
Chloride: 97 mEq/L (ref 96–112)
Creatinine, Ser: 2.32 mg/dL — ABNORMAL HIGH (ref 0.40–1.50)
GFR: 24.99 mL/min — ABNORMAL LOW (ref >60.00)
Glucose, Bld: 218 mg/dL — ABNORMAL HIGH (ref 70–99)
Potassium: 4.2 mEq/L (ref 3.5–5.1)
Sodium: 137 mEq/L (ref 135–145)

## 2022-08-04 LAB — HEPATIC FUNCTION PANEL
ALT: 9 U/L (ref 0–53)
AST: 18 U/L (ref 0–37)
Albumin: 3.8 g/dL (ref 3.5–5.2)
Alkaline Phosphatase: 91 U/L (ref 39–117)
Bilirubin, Direct: 0.3 mg/dL (ref 0.0–0.3)
Total Bilirubin: 1.3 mg/dL — ABNORMAL HIGH (ref 0.2–1.2)
Total Protein: 7 g/dL (ref 6.0–8.3)

## 2022-08-04 LAB — TROPONIN I (HIGH SENSITIVITY): High Sens Troponin I: 8 ng/L (ref 2–17)

## 2022-08-04 LAB — MICROALBUMIN / CREATININE URINE RATIO
Creatinine,U: 90.5 mg/dL
Microalb Creat Ratio: 26.7 mg/g (ref 0.0–30.0)
Microalb, Ur: 24.1 mg/dL — ABNORMAL HIGH (ref 0.0–1.9)

## 2022-08-04 LAB — TSH: TSH: 2.01 u[IU]/mL (ref 0.35–5.50)

## 2022-08-04 LAB — HEMOGLOBIN A1C: Hgb A1c MFr Bld: 8.4 % — ABNORMAL HIGH (ref 4.6–6.5)

## 2022-08-04 LAB — BRAIN NATRIURETIC PEPTIDE: Pro B Natriuretic peptide (BNP): 14 pg/mL (ref 0.0–100.0)

## 2022-08-04 MED ORDER — HUMULIN 70/30 KWIKPEN (70-30) 100 UNIT/ML ~~LOC~~ SUPN: PEN_INJECTOR | SUBCUTANEOUS | 1 refills | Status: DC

## 2022-08-04 MED ORDER — PRAVASTATIN SODIUM 40 MG PO TABS: ORAL_TABLET | ORAL | 1 refills | Status: DC

## 2022-08-04 NOTE — Progress Notes (Signed)
Subjective:  Patient ID: Connor Dixon, male    DOB: 03-09-1935  Age: 88 y.o. MRN: [id number]  CC: Hyperlipidemia, Diabetes, and Annual Exam   HPI Connor VAUGHNS presents for a CPX and f/up ------  He tells me that he has intentionally lost weight with lifestyle modifications.  He has chronic unchanged dyspnea on exertion.  He denies chest pain, diaphoresis, palpitations, dizziness, or lightheadedness.  Outpatient Medications Prior to Visit  Medication Sig Dispense Refill   ACCU-CHEK GUIDE test strip TEST FOUR TIMES DAILY 200 strip 5   allopurinol (ZYLOPRIM) 100 MG tablet TAKE 1 TABLET(100 MG) BY MOUTH DAILY 90 tablet 1   amoxicillin (AMOXIL) 500 MG capsule TAKE ONE CAPSULE BY MOUTH THREE TIMES DAILY. TAKE ONLY WHEN HAVING DENTAL PROCEDURES 30 capsule 2   blood glucose meter kit and supplies KIT Use to check blood sugar daily. DX: E11.9 1 each 0   calcitRIOL (ROCALTROL) 0.25 MCG capsule TAKE 1 CAPSULE(0.25 MCG) BY MOUTH EVERY OTHER DAY 45 capsule 0   carvedilol (COREG) 6.25 MG tablet TAKE 1 TABLET(6.25 MG) BY MOUTH TWICE DAILY WITH A MEAL 180 tablet 0   Continuous Blood Gluc Receiver (DEXCOM G7 RECEIVER) DEVI 1 Act by Does not apply route daily. 9 each 1   Continuous Blood Gluc Sensor (DEXCOM G7 SENSOR) MISC 1 Act by Does not apply route daily. 9 each 1   ELIQUIS 2.5 MG TABS tablet TAKE 1 TABLET(2.5 MG) BY MOUTH TWICE DAILY 60 tablet 5   furosemide (LASIX) 40 MG tablet Take 2 tablets in the morning and 1 tablet in the afternoon. 270 tablet 3   Glucagon (GVOKE HYPOPEN 2-PACK) 1 MG/0.2ML SOAJ Inject 1 Act into the skin daily as needed. 2 mL 5   Insulin Pen Needle (B-D ULTRAFINE III SHORT PEN) 31G X 8 MM MISC USE TO ADMINISTER INSULIN THREE TIMES DAILY 300 each 2   Insulin Syringe-Needle U-100 (INSULIN SYRINGE .5CC/31GX5/16") 31G X 5/16" 0.5 ML MISC USE FOUR TIMES DAILY AS DIRECTED 200 each 1   Lancets 33G MISC Use to test blood sugar daily. DX: E11.9 100 each 3   losartan (COZAAR) 25 MG  tablet TAKE 1 TABLET(25 MG) BY MOUTH DAILY 90 tablet 0   insulin isophane & regular human KwikPen (HUMULIN 70/30 KWIKPEN) (70-30) 100 UNIT/ML KwikPen 40 units in AM 25 units in PM 60 mL 1   loratadine (CLARITIN) 10 MG tablet Take 10 mg by mouth daily as needed for allergies.     pravastatin (PRAVACHOL) 40 MG tablet TAKE 1 TABLET(40 MG) BY MOUTH DAILY 90 tablet 0   No facility-administered medications prior to visit.    ROS Review of Systems  Constitutional:  Negative for chills, diaphoresis, fatigue, fever and unexpected weight change.  HENT: Negative.    Eyes: Negative.   Respiratory:  Positive for shortness of breath. Negative for chest tightness and wheezing.   Cardiovascular:  Negative for chest pain, palpitations and leg swelling.  Gastrointestinal:  Negative for abdominal pain, diarrhea, nausea and vomiting.  Endocrine: Negative.   Genitourinary: Negative.  Negative for difficulty urinating.  Musculoskeletal: Negative.  Negative for myalgias.  Skin: Negative.   Neurological: Negative.  Negative for dizziness, weakness and light-headedness.  Hematological:  Negative for adenopathy. Does not bruise/bleed easily.  Psychiatric/Behavioral: Negative.      Objective:  BP 112/68 (BP Location: Left Arm, Patient Position: Sitting, Cuff Size: Large)   Pulse 71   Temp 97.9 F (36.6 C) (Oral)   Ht 5\' 8"  (1.727  m)   Wt 210 lb (95.3 kg)   SpO2 92%   BMI 31.93 kg/m   BP Readings from Last 3 Encounters:  08/04/22 112/68  12/07/21 118/78  09/03/21 100/64    Wt Readings from Last 3 Encounters:  08/04/22 210 lb (95.3 kg)  12/07/21 225 lb (102.1 kg)  09/03/21 234 lb (106.1 kg)    Physical Exam Vitals reviewed.  HENT:     Nose: Nose normal.     Mouth/Throat:     Mouth: Mucous membranes are moist.  Eyes:     General: No scleral icterus.    Conjunctiva/sclera: Conjunctivae normal.  Cardiovascular:     Rate and Rhythm: Normal rate and regular rhythm.     Heart sounds: Murmur  heard.     Systolic murmur is present with a grade of 1/6.     No diastolic murmur is present.     No gallop.     Comments: EKG- Atrial-sensed V paced rhythm with prolonged AV No acute ST/T wave changes  Pulmonary:     Effort: Pulmonary effort is normal.     Breath sounds: No stridor. No wheezing, rhonchi or rales.  Abdominal:     General: Abdomen is flat.     Palpations: There is no mass.     Tenderness: There is no abdominal tenderness. There is no guarding.     Hernia: No hernia is present.  Musculoskeletal:     Cervical back: Neck supple.     Right lower leg: No edema.     Left lower leg: No edema.  Lymphadenopathy:     Cervical: No cervical adenopathy.  Skin:    General: Skin is warm and dry.  Neurological:     General: No focal deficit present.     Mental Status: He is alert. Mental status is at baseline.     Lab Results  Component Value Date   WBC 4.7 08/04/2022   HGB 14.4 08/04/2022   HCT 42.7 08/04/2022   PLT 183.0 08/04/2022   GLUCOSE 218 (H) 08/04/2022   CHOL 148 12/07/2021   TRIG 107.0 12/07/2021   HDL 46.60 12/07/2021   LDLCALC 80 12/07/2021   ALT 9 08/04/2022   AST 18 08/04/2022   NA 137 08/04/2022   K 4.2 08/04/2022   CL 97 08/04/2022   CREATININE 2.32 (H) 08/04/2022   BUN 40 (H) 08/04/2022   CO2 32 08/04/2022   TSH 2.01 08/04/2022   PSA 1.77 01/06/2010   HGBA1C 8.4 (H) 08/04/2022   MICROALBUR 24.1 (H) 08/04/2022    EP PPM/ICD IMPLANT  Result Date: 02/13/2019 Conclusion: Successful dual-chamber pacemaker generator removal and insertion of a new dual-chamber pacemaker in a patient with complete heart block who had reached elective replacement. Connor Peru, MD   Assessment & Plan:  Diabetes mellitus due to underlying condition with diabetic neuropathy, without long-term current use of insulin (HCC) -     Hepatic function panel; Future  Diabetic nephropathy associated with type 2 diabetes mellitus (HCC) -     Microalbumin / creatinine  urine ratio; Future -     Basic metabolic panel; Future  Essential hypertension- His BP is well controlled. -     Basic metabolic panel; Future -     CBC with Differential/Platelet; Future -     Hepatic function panel; Future -     Urinalysis, Routine w reflex microscopic; Future -     TSH; Future  Encounter for general adult medical examination with abnormal findings- Exam  completed, labs reviewed, vaccines reviewed, no cancer screenings indicated, patient education was given.  Insulin-requiring or dependent type II diabetes mellitus ([place])- His blood sugar is adequately well-controlled. -     Microalbumin / creatinine urine ratio; Future -     Basic metabolic panel; Future -     Hemoglobin A1c; Future -     HM Diabetes Foot Exam -     HumuLIN 70/30 KwikPen; 40 units in AM 25 units in PM  Dispense: 60 mL; Refill: 1  Stage 3b chronic kidney disease ([place])- His renal function is stable. -     Microalbumin / creatinine urine ratio; Future -     Basic metabolic panel; Future -     Urinalysis, Routine w reflex microscopic; Future  Hyperlipidemia with target LDL less than 70 - LDL goal achieved. Doing well on the statin  -     TSH; Future -     Pravastatin Sodium; TAKE 1 TABLET(40 MG) BY MOUTH DAILY  Dispense: 90 tablet; Refill: 1  DOE (dyspnea on exertion)- Exam, labs, and EKG are reassuring. -     Troponin I (High Sensitivity); Future -     Brain natriuretic peptide; Future  Paroxysmal atrial fibrillation ([place])- He has good rate and rhythm control.  Will continue the DOAC. -     EKG 12-Lead     Follow-up: Return in about 6 months (around 02/04/2023).  Scarlette Calico, MD

## 2022-08-04 NOTE — Patient Instructions (Signed)
Health Maintenance, Male Adopting a healthy lifestyle and getting preventive care are important in promoting health and wellness. Ask your health care provider about: The right schedule for you to have regular tests and exams. Things you can do on your own to prevent diseases and keep yourself healthy. What should I know about diet, weight, and exercise? Eat a healthy diet  Eat a diet that includes plenty of vegetables, fruits, low-fat dairy products, and lean protein. Do not eat a lot of foods that are high in solid fats, added sugars, or sodium. Maintain a healthy weight Body mass index (BMI) is a measurement that can be used to identify possible weight problems. It estimates body fat based on height and weight. Your health care provider can help determine your BMI and help you achieve or maintain a healthy weight. Get regular exercise Get regular exercise. This is one of the most important things you can do for your health. Most adults should: Exercise for at least 150 minutes each week. The exercise should increase your heart rate and make you sweat (moderate-intensity exercise). Do strengthening exercises at least twice a week. This is in addition to the moderate-intensity exercise. Spend less time sitting. Even light physical activity can be beneficial. Watch cholesterol and blood lipids Have your blood tested for lipids and cholesterol at 86 years of age, then have this test every 5 years. You may need to have your cholesterol levels checked more often if: Your lipid or cholesterol levels are high. You are older than 86 years of age. You are at high risk for heart disease. What should I know about cancer screening? Many types of cancers can be detected early and may often be prevented. Depending on your health history and family history, you may need to have cancer screening at various ages. This may include screening for: Colorectal cancer. Prostate cancer. Skin cancer. Lung  cancer. What should I know about heart disease, diabetes, and high blood pressure? Blood pressure and heart disease High blood pressure causes heart disease and increases the risk of stroke. This is more likely to develop in people who have high blood pressure readings or are overweight. Talk with your health care provider about your target blood pressure readings. Have your blood pressure checked: Every 3-5 years if you are 18-39 years of age. Every year if you are 40 years old or older. If you are between the ages of 65 and 75 and are a current or former smoker, ask your health care provider if you should have a one-time screening for abdominal aortic aneurysm (AAA). Diabetes Have regular diabetes screenings. This checks your fasting blood sugar level. Have the screening done: Once every three years after age 45 if you are at a normal weight and have a low risk for diabetes. More often and at a younger age if you are overweight or have a high risk for diabetes. What should I know about preventing infection? Hepatitis B If you have a higher risk for hepatitis B, you should be screened for this virus. Talk with your health care provider to find out if you are at risk for hepatitis B infection. Hepatitis C Blood testing is recommended for: Everyone born from 1945 through 1965. Anyone with known risk factors for hepatitis C. Sexually transmitted infections (STIs) You should be screened each year for STIs, including gonorrhea and chlamydia, if: You are sexually active and are younger than 86 years of age. You are older than 86 years of age and your   health care provider tells you that you are at risk for this type of infection. Your sexual activity has changed since you were last screened, and you are at increased risk for chlamydia or gonorrhea. Ask your health care provider if you are at risk. Ask your health care provider about whether you are at high risk for HIV. Your health care provider  may recommend a prescription medicine to help prevent HIV infection. If you choose to take medicine to prevent HIV, you should first get tested for HIV. You should then be tested every 3 months for as long as you are taking the medicine. Follow these instructions at home: Alcohol use Do not drink alcohol if your health care provider tells you not to drink. If you drink alcohol: Limit how much you have to 0-2 drinks a day. Know how much alcohol is in your drink. In the [place] one drink equals one 12 oz bottle of beer (355 mL), one 5 oz glass of wine (148 mL), or one 1 oz glass of hard liquor (44 mL). Lifestyle Do not use any products that contain nicotine or tobacco. These products include cigarettes, chewing tobacco, and vaping devices, such as e-cigarettes. If you need help quitting, ask your health care provider. Do not use street drugs. Do not share needles. Ask your health care provider for help if you need support or information about quitting drugs. General instructions Schedule regular health, dental, and eye exams. Stay current with your vaccines. Tell your health care provider if: You often feel depressed. You have ever been abused or do not feel safe at home. Summary Adopting a healthy lifestyle and getting preventive care are important in promoting health and wellness. Follow your health care provider's instructions about healthy diet, exercising, and getting tested or screened for diseases. Follow your health care provider's instructions on monitoring your cholesterol and blood pressure. This information is not intended to replace advice given to you by your health care provider. Make sure you discuss any questions you have with your health care provider. Document Revised: 09/21/2020 Document Reviewed: 09/21/2020 Elsevier Patient Education  2023 Elsevier Inc.  

## 2022-08-08 ENCOUNTER — Ambulatory Visit: Payer: Self-pay

## 2022-08-08 NOTE — Patient Instructions (Signed)
Visit Information  Thank you for taking time to visit with me today. Please don't hesitate to contact me if I can be of assistance to you.   Following are the goals we discussed today:   Goals Addressed             This Visit's Progress    Diabetes managment/education       Interventions Today    Flowsheet Row Most Recent Value  Chronic Disease   Chronic disease during today's visit Diabetes  General Interventions   General Interventions Discussed/Reviewed General Interventions Reviewed, Annual Foot Exam, Labs, Doctor Visits  [reviewed recent A1C 8.4 trending down, discussed importance of foot care and checking feet daily]  Labs Hgb A1c every 3 months  Doctor Visits Discussed/Reviewed Doctor Visits Reviewed, PCP  PCP/Specialist Visits Compliance with follow-up visit  Exercise Interventions   Exercise Discussed/Reviewed Physical Activity  Physical Activity Discussed/Reviewed Types of exercise  [encouraged to continue to door activities patient enjoys(flower gardening), encouraged to try to increase walking while enjoying outdoor activities]  Education Interventions   Education Provided Provided Web-based Education, Provided Education  Provided Verbal Education On Blood Sugar Monitoring, Foot Care, Nutrition  Nutrition Interventions   Nutrition Discussed/Reviewed Nutrition Reviewed, Portion sizes  Pharmacy Interventions   Pharmacy Dicussed/Reviewed Pharmacy Topics Reviewed            Our next appointment is by telephone on 09/08/22 at 11:30 am  Please call the care guide team at [phone] if you need to cancel or reschedule your appointment.   If you are experiencing a Mental Health or [place] or need someone to talk to, please call the Suicide and Crisis Lifeline: [place], RN, MSN, BSN, [place] [phone]

## 2022-08-08 NOTE — Patient Outreach (Signed)
  Care Coordination   Follow Up Visit Note   08/08/2022 Name: Connor Dixon MRN: [id number] DOB: September 08, 1934  Connor Dixon is a 88 y.o. year old male who sees Janith Lima, MD for primary care. I spoke with  Vick Frees by phone today.  What matters to the patients health and wellness today?  Mr. Balliett reports he saw primary care on 08/03/22. He reports he has his medications and denies any concerns regarding medication. Blood sugar this morning was 200 ac, but states it is due to eating something sweet yesterday. He reports he is still active and he is looking forward to the warm weather where he can get to work on his flower [place].  Goals Addressed             This Visit's Progress    Diabetes managment/education       Interventions Today    Flowsheet Row Most Recent Value  Chronic Disease   Chronic disease during today's visit Diabetes  General Interventions   General Interventions Discussed/Reviewed General Interventions Reviewed, Annual Foot Exam, Labs, Doctor Visits  [reviewed recent A1C 8.4 trending down, discussed importance of foot care and checking feet daily]  Labs Hgb A1c every 3 months  Doctor Visits Discussed/Reviewed Doctor Visits Reviewed, PCP  PCP/Specialist Visits Compliance with follow-up visit  Exercise Interventions   Exercise Discussed/Reviewed Physical Activity  Physical Activity Discussed/Reviewed Types of exercise  [encouraged to continue to door activities patient enjoys(flower gardening), encouraged to try to increase walking while enjoying outdoor activities]  Education Interventions   Education Provided Provided Web-based Education, Provided Education  Provided Verbal Education On Blood Sugar Monitoring, Foot Care, Nutrition  Nutrition Interventions   Nutrition Discussed/Reviewed Nutrition Reviewed, Portion sizes  Pharmacy Interventions   Pharmacy Dicussed/Reviewed Pharmacy Topics Reviewed            SDOH assessments and interventions  completed:  No  Care Coordination Interventions:  Yes, provided   Follow up plan: Follow up call scheduled for 09/08/22    Encounter Outcome:  Pt. Visit Completed   Thea Silversmith, RN, MSN, BSN, [place] Coordinator [phone]

## 2022-08-10 ENCOUNTER — Other Ambulatory Visit: Payer: Self-pay | Admitting: Internal Medicine

## 2022-08-10 DIAGNOSIS — E559 Vitamin D deficiency, unspecified: Secondary | ICD-10-CM

## 2022-08-10 DIAGNOSIS — I10 Essential (primary) hypertension: Secondary | ICD-10-CM

## 2022-08-12 ENCOUNTER — Other Ambulatory Visit: Payer: Self-pay | Admitting: Internal Medicine

## 2022-08-12 DIAGNOSIS — E559 Vitamin D deficiency, unspecified: Secondary | ICD-10-CM

## 2022-08-15 ENCOUNTER — Other Ambulatory Visit: Payer: Self-pay | Admitting: Internal Medicine

## 2022-08-15 DIAGNOSIS — E559 Vitamin D deficiency, unspecified: Secondary | ICD-10-CM

## 2022-08-15 MED ORDER — CALCITRIOL 0.25 MCG PO CAPS: 0.2500 ug | ORAL_CAPSULE | Freq: Every day | ORAL | 0 refills | Status: DC

## 2022-08-19 ENCOUNTER — Ambulatory Visit (INDEPENDENT_AMBULATORY_CARE_PROVIDER_SITE_OTHER): Payer: Medicare PPO

## 2022-08-19 ENCOUNTER — Telehealth: Payer: Self-pay

## 2022-08-19 DIAGNOSIS — I495 Sick sinus syndrome: Secondary | ICD-10-CM

## 2022-08-19 LAB — CUP PACEART REMOTE DEVICE CHECK
Battery Remaining Longevity: 50 mo
Battery Remaining Percentage: 56 %
Battery Voltage: 2.98 V
Brady Statistic AP VP Percent: 2.7 %
Brady Statistic AP VS Percent: 1 %
Brady Statistic AS VP Percent: 97 %
Brady Statistic AS VS Percent: 1 %
Brady Statistic RA Percent Paced: 2.2 %
Brady Statistic RV Percent Paced: 99 %
Date Time Interrogation Session: 20240405045514
Implantable Lead Connection Status: 753985
Implantable Lead Connection Status: 753985
Implantable Lead Implant Date: 20111202
Implantable Lead Implant Date: 20111202
Implantable Lead Location: 753859
Implantable Lead Location: 753860
Implantable Pulse Generator Implant Date: 20200930
Lead Channel Impedance Value: 360 Ohm
Lead Channel Impedance Value: 450 Ohm
Lead Channel Pacing Threshold Amplitude: 0.5 V
Lead Channel Pacing Threshold Amplitude: 2.5 V
Lead Channel Pacing Threshold Pulse Width: 0.4 ms
Lead Channel Pacing Threshold Pulse Width: 0.8 ms
Lead Channel Sensing Intrinsic Amplitude: 1.7 mV
Lead Channel Sensing Intrinsic Amplitude: 9.5 mV
Lead Channel Setting Pacing Amplitude: 2 V
Lead Channel Setting Pacing Amplitude: 2.75 V
Lead Channel Setting Pacing Pulse Width: 0.8 ms
Lead Channel Setting Sensing Sensitivity: 4 mV
Pulse Gen Model: 2272
Pulse Gen Serial Number: 9156495

## 2022-08-19 NOTE — Telephone Encounter (Signed)
Follow up scheduled

## 2022-08-19 NOTE — Telephone Encounter (Signed)
Alert received from [employer]:  Scheduled remote reviewed. Normal device function.   RV threshold 2.5V @ 0.29ms, previous remote 1.625V - route to triage 3 brief noise reversion atrial and ventricular channel (known)  Pt due for yearly follow up for device evaluation.  Message sent to scheduler to arrange appointment.  See scheduled remote transmission.

## 2022-08-21 NOTE — Progress Notes (Unsigned)
Cardiology Office Note Date:  08/21/2022  Patient ID:  Connor Dixon, Connor Dixon 01/20/1935, MRN [id number] PCP:  Etta Grandchild, MD  Electrophysiologist: Dr. Ladona Ridgel  ***refresh   Chief Complaint: *** annual visit, device alert  History of Present Illness: Connor Dixon is a 88 y.o. male with history of HTN, CHB w/PPM, AFib, obesity, CKD (III), HLD, DM.  He saw Dr. Ladona Ridgel 09/03/21, doing well, had lost some weight, noted some noise on a lead that was brief with plans to monitor.  Recent device remote alert for 2 issues Increased RV threshold (2.5/0.8 w/prior 1.625 And noise on the RA lead  *** symptoms *** noise, RA pacing burden *** RV lead?? *** AF burden *** eliquis, on LOW DOSE???, bleeding, labs    Device information Abbott dual chamber PPM implanted 04/16/2010, gen change 02/13/2019   Past Medical History:  Diagnosis Date   Acute on chronic diastolic congestive heart failure (HCC)    Arthus phenomenon    Atrioventricular block, complete (HCC)    Cardiac pacemaker in situ 07/22/2010   Chronic kidney disease, stage 3 (HCC)    Presumably from longstanding DM & HTN. Scr has gradually increased over the past 2 years (1.6 in 12/2010, 2 in 03/2011, and up to 2.1 in 01/2013)   DIABETES MELLITUS, TYPE I, ADULT ONSET 08/06/2007   DM (diabetes mellitus) (HCC)    adult onset    Essential hypertension, benign 08/06/2007   Very good BP control   History of second degree heart block    HLD (hyperlipidemia)    HYPERLIPIDEMIA 08/06/2007   Hypopotassemia    Morbid obesity (HCC)    Obesity    Other and unspecified angina pectoris    typical   Type I (juvenile type) diabetes mellitus without mention of complication, not stated as uncontrolled    (04/25/13 OV with Dr. Arrie Aran) = Poorly controlled & is working  w/Dr. Arthur Holms to improve this. Most recent Hgb A1c was 8.2% but Mr. Christopher reports it was down to 7.6 last month.    Past Surgical History:  Procedure Laterality Date    CATARACT EXTRACTION  8/08   [place]   PPM GENERATOR CHANGEOUT N/A 02/13/2019   Procedure: PPM GENERATOR CHANGEOUT;  Surgeon: Marinus Maw, MD;  Location: [facility] INVASIVE CV LAB;  Service: Cardiovascular;  Laterality: N/A;   PTVDP  12/11   PPM - [employer]   TOTAL HIP ARTHROPLASTY  04/05/06   right    Current Outpatient Medications  Medication Sig Dispense Refill   ACCU-CHEK GUIDE test strip TEST FOUR TIMES DAILY 200 strip 5   allopurinol (ZYLOPRIM) 100 MG tablet TAKE 1 TABLET(100 MG) BY MOUTH DAILY 90 tablet 1   amoxicillin (AMOXIL) 500 MG capsule TAKE ONE CAPSULE BY MOUTH THREE TIMES DAILY. TAKE ONLY WHEN HAVING DENTAL PROCEDURES 30 capsule 2   blood glucose meter kit and supplies KIT Use to check blood sugar daily. DX: E11.9 1 each 0   calcitRIOL (ROCALTROL) 0.25 MCG capsule TAKE 1 CAPSULE(0.25 MCG) BY MOUTH DAILY 90 capsule 0   carvedilol (COREG) 6.25 MG tablet TAKE 1 TABLET(6.25 MG) BY MOUTH TWICE DAILY WITH A MEAL 180 tablet 0   Continuous Blood Gluc Receiver (DEXCOM G7 RECEIVER) DEVI 1 Act by Does not apply route daily. 9 each 1   Continuous Blood Gluc Sensor (DEXCOM G7 SENSOR) MISC 1 Act by Does not apply route daily. 9 each 1   ELIQUIS 2.5 MG TABS tablet TAKE 1 TABLET(2.5 MG) BY  MOUTH TWICE DAILY 60 tablet 5   furosemide (LASIX) 40 MG tablet Take 2 tablets in the morning and 1 tablet in the afternoon. 270 tablet 3   Glucagon (GVOKE HYPOPEN 2-PACK) 1 MG/0.2ML SOAJ Inject 1 Act into the skin daily as needed. 2 mL 5   insulin isophane & regular human KwikPen (HUMULIN 70/30 KWIKPEN) (70-30) 100 UNIT/ML KwikPen 40 units in AM 25 units in PM 60 mL 1   Insulin Pen Needle (B-D ULTRAFINE III SHORT PEN) 31G X 8 MM MISC USE TO ADMINISTER INSULIN THREE TIMES DAILY 300 each 2   Insulin Syringe-Needle U-100 (INSULIN SYRINGE .5CC/31GX5/16") 31G X 5/16" 0.5 ML MISC USE FOUR TIMES DAILY AS DIRECTED 200 each 1   Lancets 33G MISC Use to test blood sugar daily. DX: E11.9 100 each 3   losartan  (COZAAR) 25 MG tablet TAKE 1 TABLET(25 MG) BY MOUTH DAILY 90 tablet 0   pravastatin (PRAVACHOL) 40 MG tablet TAKE 1 TABLET(40 MG) BY MOUTH DAILY 90 tablet 1   No current facility-administered medications for this visit.    Allergies:   Oysters [shellfish allergy], Farxiga [dapagliflozin], and Apple juice   Social History:  The patient  reports that he has never smoked. He has never used smokeless tobacco. He reports that he does not drink alcohol and does not use drugs.   Family History:  The patient's family history includes Asthma in his father and mother; Coronary artery disease in his father and mother; Diabetes in his father; Heart attack (age of onset: 97) in his mother; Hyperlipidemia in his father; Stroke (age of onset: 30) in his father.  ROS:  Please see the history of present illness.    All other systems are reviewed and otherwise negative.   PHYSICAL EXAM:  VS:  There were no vitals taken for this visit. BMI: There is no height or weight on file to calculate BMI. Well nourished, well developed, in no acute distress HEENT: normocephalic, atraumatic Neck: no JVD, carotid bruits or masses Cardiac:  *** RRR; no significant murmurs, no rubs, or gallops Lungs:  *** CTA b/l, no wheezing, rhonchi or rales Abd: soft, nontender MS: no deformity or *** atrophy Ext: *** no edema Skin: warm and dry, no rash Neuro:  No gross deficits appreciated Psych: euthymic mood, full affect  *** PPM site is stable, no tethering or discomfort   EKG:  not done today  Device interrogation done today and reviewed by myself:  ***   08/07/2015: TTE Left ventricle: The cavity size was normal. Wall thickness was    increased in a pattern of mild LVH. Systolic function was normal.    The estimated ejection fraction was in the range of 60% to 65%.    Wall motion was normal; there were no regional wall motion    abnormalities.  - Aortic valve: Mildly calcified annulus. Moderately thickened,     moderately calcified leaflets.  - Pulmonary arteries: Systolic pressure was moderately increased.    PA peak pressure: 44 mm Hg (S).   Recent Labs: 08/04/2022: ALT 9; BUN 40; Creatinine, Ser 2.32; Hemoglobin 14.4; Platelets 183.0; Potassium 4.2; Pro B Natriuretic peptide (BNP) 14.0; Sodium 137; TSH 2.01  12/07/2021: Cholesterol 148; HDL 46.60; LDL Cholesterol 80; Total CHOL/HDL Ratio 3; Triglycerides 107.0; VLDL 21.4   CrCl cannot be calculated (Unknown ideal weight.).   Wt Readings from Last 3 Encounters:  08/04/22 210 lb (95.3 kg)  12/07/21 225 lb (102.1 kg)  09/03/21 234 lb (106.1 kg)  Other studies reviewed: Additional studies/records reviewed today include: summarized above  ASSESSMENT AND PLAN:  PPM Known RA lead noise *** ***  Paroxysmal AFib CHA2DS2Vasc is 4, on eliquis, *** in ***appropriately dosed *** burden  HTN ***  Disposition: F/u with ***  Current medicines are reviewed at length with the patient today.  The patient did not have any concerns regarding medicines.  Norma FredricksonSigned, Christen Bedoya, PA-C 08/21/2022 10:19 AM     [facility] [address] [place] [address] [phone] (office)  [phone] (fax)

## 2022-08-23 ENCOUNTER — Encounter: Payer: Self-pay | Admitting: Physician Assistant

## 2022-08-23 ENCOUNTER — Ambulatory Visit: Payer: Medicare PPO | Attending: Physician Assistant | Admitting: Physician Assistant

## 2022-08-23 VITALS — BP 112/70 | HR 66 | Ht 68.0 in | Wt 211.8 lb

## 2022-08-23 DIAGNOSIS — I10 Essential (primary) hypertension: Secondary | ICD-10-CM

## 2022-08-23 DIAGNOSIS — D6869 Other thrombophilia: Secondary | ICD-10-CM

## 2022-08-23 DIAGNOSIS — Z950 Presence of cardiac pacemaker: Secondary | ICD-10-CM | POA: Diagnosis not present

## 2022-08-23 DIAGNOSIS — I480 Paroxysmal atrial fibrillation: Secondary | ICD-10-CM | POA: Diagnosis not present

## 2022-08-23 LAB — CUP PACEART INCLINIC DEVICE CHECK
Battery Remaining Longevity: 30 mo
Battery Voltage: 2.96 V
Brady Statistic RA Percent Paced: 2.2 %
Brady Statistic RV Percent Paced: 99.5 %
Date Time Interrogation Session: 20240409132328
Implantable Lead Connection Status: 753985
Implantable Lead Connection Status: 753985
Implantable Lead Implant Date: 20111202
Implantable Lead Implant Date: 20111202
Implantable Lead Location: 753859
Implantable Lead Location: 753860
Implantable Pulse Generator Implant Date: 20200930
Lead Channel Impedance Value: 262.5 Ohm
Lead Channel Impedance Value: 487.5 Ohm
Lead Channel Pacing Threshold Amplitude: 0.75 V
Lead Channel Pacing Threshold Amplitude: 0.75 V
Lead Channel Pacing Threshold Amplitude: 2 V
Lead Channel Pacing Threshold Amplitude: 2 V
Lead Channel Pacing Threshold Pulse Width: 0.4 ms
Lead Channel Pacing Threshold Pulse Width: 0.4 ms
Lead Channel Pacing Threshold Pulse Width: 1 ms
Lead Channel Pacing Threshold Pulse Width: 1 ms
Lead Channel Sensing Intrinsic Amplitude: 2.6 mV
Lead Channel Sensing Intrinsic Amplitude: 9.5 mV
Lead Channel Setting Pacing Amplitude: 2 V
Lead Channel Setting Pacing Amplitude: 2.25 V
Lead Channel Setting Pacing Pulse Width: 0.8 ms
Lead Channel Setting Sensing Sensitivity: 4 mV
Pulse Gen Model: 2272
Pulse Gen Serial Number: 9156495

## 2022-08-23 NOTE — Patient Instructions (Signed)
Medication Instructions:  Your physician recommends that you continue on your current medications as directed. Please refer to the Current Medication list given to you today.  *If you need a refill on your cardiac medications before your next appointment, please call your pharmacy*  Lab Work: None ordered If you have labs (blood work) drawn today and your tests are completely normal, you will receive your results only by: MyChart Message (if you have MyChart) OR A paper copy in the mail If you have any lab test that is abnormal or we need to change your treatment, we will call you to review the results.  Follow-Up: At [facility], you and your health needs are our priority.  As part of our continuing mission to provide you with exceptional heart care, we have created designated Provider Care Teams.  These Care Teams include your primary Cardiologist (physician) and Advanced Practice Providers (APPs -  Physician Assistants and Nurse Practitioners) who all work together to provide you with the care you need, when you need it.  Your next appointment:   2 month(s)  Provider:   Lewayne Bunting, MD ONLY

## 2022-08-29 DIAGNOSIS — E119 Type 2 diabetes mellitus without complications: Secondary | ICD-10-CM | POA: Diagnosis not present

## 2022-09-08 ENCOUNTER — Ambulatory Visit: Payer: Self-pay

## 2022-09-08 NOTE — Patient Outreach (Signed)
  Care Coordination   Follow Up Visit Note   09/08/2022 Name: Connor Dixon MRN: [id number] DOB: 02-Sep-1934  Connor Dixon is a 88 y.o. year old male who sees Etta Grandchild, MD for primary care. I spoke with  Connor Dixon by phone today.  What matters to the patients health and wellness today?  Connor Dixon reprots he continues to check blood sugars-has Dexcom 7. He reports blood sugars vary: BS was 157 at 6 am this morning then has increased to 213 when he got up 213. He reports he took his medications and ate. Patient states target range 80-130 not realistic for him and reports he has discussed blood sugars and target ranges with PCP and adds they discuss at every visit.Marland Kitchen He adds his son also keeps up with what his blood sugars are. He has no questions or concerns at this time.  Goals Addressed             This Visit's Progress    Diabetes managment/education       Interventions Today    Flowsheet Row Most Recent Value  Chronic Disease   Chronic disease during today's visit Diabetes  General Interventions   General Interventions Discussed/Reviewed General Interventions Reviewed, Doctor Visits  Doctor Visits Discussed/Reviewed Doctor Visits Reviewed  [reviewed instructions per cardiologist office visit 08/23/22]  Education Interventions   Education Provided Provided Education  Provided Verbal Education On When to see the doctor, Other, Blood Sugar Monitoring  [checking blood sugar, discussed target blood sugar range 80-130 fasting and <180 about 2 hours after a meal or per provider recommendation. RNCM encouraged patient to discuss target range with provider.]  Nutrition Interventions   Nutrition Discussed/Reviewed Nutrition Reviewed  [discussed healthy eating]  Pharmacy Interventions   Pharmacy Dicussed/Reviewed Pharmacy Topics Reviewed, Medications and their functions, Medication Adherence            SDOH assessments and interventions completed:  No  Care Coordination  Interventions:  Yes, provided   Follow up plan: Follow up call scheduled for 10/06/22    Encounter Outcome:  Pt. Visit Completed   Kathyrn Sheriff, RN, MSN, BSN, CCM [facility] Care Coordinator [phone]

## 2022-09-08 NOTE — Patient Instructions (Signed)
Visit Information  Thank you for taking time to visit with me today. Please don't hesitate to contact me if I can be of assistance to you.   Following are the goals we discussed today:   Goals Addressed             This Visit's Progress    Diabetes managment/education       Interventions Today    Flowsheet Row Most Recent Value  Chronic Disease   Chronic disease during today's visit Diabetes  General Interventions   General Interventions Discussed/Reviewed General Interventions Reviewed, Doctor Visits  Doctor Visits Discussed/Reviewed Doctor Visits Reviewed  [reviewed instructions per cardiologist office visit 08/23/22]  Education Interventions   Education Provided Provided Education  Provided Verbal Education On When to see the doctor, Other, Blood Sugar Monitoring  [checking blood sugar, discussed target blood sugar range 80-130 fasting and <180 about 2 hours after a meal or per provider recommendation. RNCM encouraged patient to discuss target range with provider.]  Nutrition Interventions   Nutrition Discussed/Reviewed Nutrition Reviewed  [discussed healthy eating]  Pharmacy Interventions   Pharmacy Dicussed/Reviewed Pharmacy Topics Reviewed, Medications and their functions, Medication Adherence            Our next appointment is by telephone on 10/06/22 at 11:30 am  Please call the care guide team at [phone] if you need to cancel or reschedule your appointment.   If you are experiencing a Mental Health or Behavioral Health Crisis or need someone to talk to, please call the Suicide and Crisis Lifeline: 72   Kathyrn Sheriff, RN, MSN, BSN, CCM [facility] Care Coordinator [phone]     Hemoglobin A1C Test Why am I having this test? You may have the hemoglobin A1C test (A1C test) done to: Check your risk for developing diabetes. Diagnose diabetes. Check the long-term control of blood sugar (glucose) in people who have diabetes and help make treatment decisions. This  test may be done with other blood glucose tests, such as fasting blood glucose and oral glucose tolerance tests. What is being tested? Hemoglobin is a type of protein in the blood that carries oxygen. Glucose attaches to hemoglobin to form glycated hemoglobin. This test checks the amount of glycated hemoglobin in your blood. This is a good indicator of the average amount of glucose in your blood during the past 2-3 months. What kind of sample is taken?  A blood sample is required for this test. It is usually collected by inserting a needle into a blood vessel. Tell a health care provider about: All medicines you are taking, including vitamins, herbs, eye drops, creams, and over-the-counter medicines. Any blood disorders you have. Any surgeries you have had. Any medical conditions you have. Whether you are pregnant or may be pregnant. How are the results reported? Your results will be reported as a percentage indicating how much of your hemoglobin has glucose attached to it. Your health care provider will compare your results to normal ranges established after testing a large group of people (reference ranges). Reference ranges may vary among labs and hospitals. For this test, common reference ranges are: Adult or child without diabetes: 4-5.6%. Adult or child with prediabetes: 5.7%-6.4%. Adult or child with diabetes: 6.5% or higher. What do the results mean? If you do not have diabetes: A result within the reference range means that you are not at high risk for diabetes. A result of 5.7-6.4% means that you have a high risk of developing diabetes, and you have prediabetes. Having prediabetes  puts you at risk for developing type 2 diabetes. You may have more tests, including a repeat A1C test. Results of 6.5% or higher on two separate A1C tests mean that you have diabetes. You may have more tests to confirm the diagnosis. If you have diabetes: A result of 7% usually means that your diabetes is  under control. A result of less than 7% also means that diabetes is under control. This result may be an appropriate goal for those for whom there is a low risk of low blood sugar (hypoglycemia). A result of less than 8% may also mean that diabetes is under control. This result may be an appropriate goal for those who do not benefit from more blood glucose control or who are at high risk for hypoglycemia. Abnormally low A1C values may be caused by: Pregnancy. Severe blood loss. Receiving donated blood (transfusions). Low red blood cell count (anemia). Long-term kidney failure. Some unusual forms (variants) of hemoglobin. Talk with your health care provider about what your results mean. Questions to ask your health care provider Ask your health care provider, or the department that is doing the test: When will my results be ready? How will I get my results? What are my treatment options? What other tests do I need? What are my next steps? Summary The A1C test is done to check your risk for developing diabetes, diagnose diabetes, and check the long-term control of blood sugar (glucose) in people who have diabetes and help make treatment decisions. Hemoglobin is a type of protein in the blood that carries oxygen. Glucose attaches to hemoglobin to form glycated hemoglobin. This test checks the amount of glycated hemoglobin in your blood. Talk with your health care provider about what your results mean. This information is not intended to replace advice given to you by your health care provider. Make sure you discuss any questions you have with your health care provider. Document Revised: 12/07/2021 Document Reviewed: 06/16/2021 Elsevier Patient Education  2023 Elsevier Inc.   Diabetes Mellitus and Nutrition, Adult When you have diabetes, or diabetes mellitus, it is very important to have healthy eating habits because your blood sugar (glucose) levels are greatly affected by what you eat and  drink. Eating healthy foods in the right amounts, at about the same times every day, can help you: Manage your blood glucose. Lower your risk of heart disease. Improve your blood pressure. Reach or maintain a healthy weight. What can affect my meal plan? Every person with diabetes is different, and each person has different needs for a meal plan. Your health care provider may recommend that you work with a dietitian to make a meal plan that is best for you. Your meal plan may vary depending on factors such as: The calories you need. The medicines you take. Your weight. Your blood glucose, blood pressure, and cholesterol levels. Your activity level. Other health conditions you have, such as heart or kidney disease. How do carbohydrates affect me? Carbohydrates, also called carbs, affect your blood glucose level more than any other type of food. Eating carbs raises the amount of glucose in your blood. It is important to know how many carbs you can safely have in each meal. This is different for every person. Your dietitian can help you calculate how many carbs you should have at each meal and for each snack. How does alcohol affect me? Alcohol can cause a decrease in blood glucose (hypoglycemia), especially if you use insulin or take certain diabetes medicines by  mouth. Hypoglycemia can be a life-threatening condition. Symptoms of hypoglycemia, such as sleepiness, dizziness, and confusion, are similar to symptoms of having too much alcohol. Do not drink alcohol if: Your health care provider tells you not to drink. You are pregnant, may be pregnant, or are planning to become pregnant. If you drink alcohol: Limit how much you have to: 0-1 drink a day for women. 0-2 drinks a day for men. Know how much alcohol is in your drink. In the [place] one drink equals one 12 oz bottle of beer (355 mL), one 5 oz glass of wine (148 mL), or one 1 oz glass of hard liquor (44 mL). Keep yourself hydrated with  water, diet soda, or unsweetened iced tea. Keep in mind that regular soda, juice, and other mixers may contain a lot of sugar and must be counted as carbs. What are tips for following this plan?  Reading food labels Start by checking the serving size on the Nutrition Facts label of packaged foods and drinks. The number of calories and the amount of carbs, fats, and other nutrients listed on the label are based on one serving of the item. Many items contain more than one serving per package. Check the total grams (g) of carbs in one serving. Check the number of grams of saturated fats and trans fats in one serving. Choose foods that have a low amount or none of these fats. Check the number of milligrams (mg) of salt (sodium) in one serving. Most people should limit total sodium intake to less than 2,300 mg per day. Always check the nutrition information of foods labeled as "low-fat" or "nonfat." These foods may be higher in added sugar or refined carbs and should be avoided. Talk to your dietitian to identify your daily goals for nutrients listed on the label. Shopping Avoid buying canned, pre-made, or processed foods. These foods tend to be high in fat, sodium, and added sugar. Shop around the outside edge of the grocery store. This is where you will most often find fresh fruits and vegetables, bulk grains, fresh meats, and fresh dairy products. Cooking Use low-heat cooking methods, such as baking, instead of high-heat cooking methods, such as deep frying. Cook using healthy oils, such as olive, canola, or sunflower oil. Avoid cooking with butter, cream, or high-fat meats. Meal planning Eat meals and snacks regularly, preferably at the same times every day. Avoid going long periods of time without eating. Eat foods that are high in fiber, such as fresh fruits, vegetables, beans, and whole grains. Eat 4-6 oz (112-168 g) of lean protein each day, such as lean meat, chicken, fish, eggs, or tofu. One  ounce (oz) (28 g) of lean protein is equal to: 1 oz (28 g) of meat, chicken, or fish. 1 egg.  cup (62 g) of tofu. Eat some foods each day that contain healthy fats, such as avocado, nuts, seeds, and fish. What foods should I eat? Fruits Berries. Apples. Oranges. Peaches. Apricots. Plums. Grapes. Mangoes. Papayas. Pomegranates. Kiwi. Cherries. Vegetables Leafy greens, including lettuce, spinach, kale, chard, collard greens, mustard greens, and cabbage. Beets. Cauliflower. Broccoli. Carrots. Green beans. Tomatoes. Peppers. Onions. Cucumbers. Brussels sprouts. Grains Whole grains, such as whole-wheat or whole-grain bread, crackers, tortillas, cereal, and pasta. Unsweetened oatmeal. Quinoa. Brown or wild rice. Meats and other proteins Seafood. Poultry without skin. Lean cuts of poultry and beef. Tofu. Nuts. Seeds. Dairy Low-fat or fat-free dairy products such as milk, yogurt, and cheese. The items listed above may not be  a complete list of foods and beverages you can eat and drink. Contact a dietitian for more information. What foods should I avoid? Fruits Fruits canned with syrup. Vegetables Canned vegetables. Frozen vegetables with butter or cream sauce. Grains Refined white flour and flour products such as bread, pasta, snack foods, and cereals. Avoid all processed foods. Meats and other proteins Fatty cuts of meat. Poultry with skin. Breaded or fried meats. Processed meat. Avoid saturated fats. Dairy Full-fat yogurt, cheese, or milk. Beverages Sweetened drinks, such as soda or iced tea. The items listed above may not be a complete list of foods and beverages you should avoid. Contact a dietitian for more information. Questions to ask a health care provider Do I need to meet with a certified diabetes care and education specialist? Do I need to meet with a dietitian? What number can I call if I have questions? When are the best times to check my blood glucose? Where to find more  information: American Diabetes Association: diabetes.org Academy of Nutrition and Dietetics: eatright.[employer] of Diabetes and Digestive and Kidney Diseases: StageSync.si Association of Diabetes Care & Education Specialists: diabeteseducator.org Summary It is important to have healthy eating habits because your blood sugar (glucose) levels are greatly affected by what you eat and drink. It is important to use alcohol carefully. A healthy meal plan will help you manage your blood glucose and lower your risk of heart disease. Your health care provider may recommend that you work with a dietitian to make a meal plan that is best for you. This information is not intended to replace advice given to you by your health care provider. Make sure you discuss any questions you have with your health care provider. Document Revised: 12/04/2019 Document Reviewed: 12/04/2019 Elsevier Patient Education  2023 [employer].

## 2022-09-09 ENCOUNTER — Other Ambulatory Visit: Payer: Self-pay | Admitting: Internal Medicine

## 2022-09-09 DIAGNOSIS — M10071 Idiopathic gout, right ankle and foot: Secondary | ICD-10-CM

## 2022-09-09 DIAGNOSIS — M1A3790 Chronic gout due to renal impairment, unspecified ankle and foot, without tophus (tophi): Secondary | ICD-10-CM

## 2022-09-20 NOTE — Progress Notes (Signed)
Remote pacemaker transmission.   

## 2022-09-22 DIAGNOSIS — I4891 Unspecified atrial fibrillation: Secondary | ICD-10-CM | POA: Diagnosis not present

## 2022-09-22 DIAGNOSIS — I2510 Atherosclerotic heart disease of native coronary artery without angina pectoris: Secondary | ICD-10-CM | POA: Diagnosis not present

## 2022-09-22 DIAGNOSIS — J309 Allergic rhinitis, unspecified: Secondary | ICD-10-CM | POA: Diagnosis not present

## 2022-09-22 DIAGNOSIS — E785 Hyperlipidemia, unspecified: Secondary | ICD-10-CM | POA: Diagnosis not present

## 2022-09-22 DIAGNOSIS — I130 Hypertensive heart and chronic kidney disease with heart failure and stage 1 through stage 4 chronic kidney disease, or unspecified chronic kidney disease: Secondary | ICD-10-CM | POA: Diagnosis not present

## 2022-09-22 DIAGNOSIS — E669 Obesity, unspecified: Secondary | ICD-10-CM | POA: Diagnosis not present

## 2022-09-22 DIAGNOSIS — Z833 Family history of diabetes mellitus: Secondary | ICD-10-CM | POA: Diagnosis not present

## 2022-09-22 DIAGNOSIS — M1030 Gout due to renal impairment, unspecified site: Secondary | ICD-10-CM | POA: Diagnosis not present

## 2022-09-22 DIAGNOSIS — Z961 Presence of intraocular lens: Secondary | ICD-10-CM | POA: Diagnosis not present

## 2022-09-22 DIAGNOSIS — E11311 Type 2 diabetes mellitus with unspecified diabetic retinopathy with macular edema: Secondary | ICD-10-CM | POA: Diagnosis not present

## 2022-09-22 DIAGNOSIS — I509 Heart failure, unspecified: Secondary | ICD-10-CM | POA: Diagnosis not present

## 2022-09-22 DIAGNOSIS — M810 Age-related osteoporosis without current pathological fracture: Secondary | ICD-10-CM | POA: Diagnosis not present

## 2022-09-22 DIAGNOSIS — M1990 Unspecified osteoarthritis, unspecified site: Secondary | ICD-10-CM | POA: Diagnosis not present

## 2022-09-22 DIAGNOSIS — Z794 Long term (current) use of insulin: Secondary | ICD-10-CM | POA: Diagnosis not present

## 2022-10-03 ENCOUNTER — Telehealth: Payer: Self-pay | Admitting: Internal Medicine

## 2022-10-03 ENCOUNTER — Other Ambulatory Visit: Payer: Self-pay | Admitting: Internal Medicine

## 2022-10-03 DIAGNOSIS — E119 Type 2 diabetes mellitus without complications: Secondary | ICD-10-CM

## 2022-10-03 MED ORDER — "INSULIN SYRINGE 31G X 5/16"" 0.5 ML MISC": 3 refills | Status: DC

## 2022-10-03 NOTE — Telephone Encounter (Signed)
Prescription Request  10/03/2022  LOV: 08/04/2022  What is the name of the medication or equipment? nsulin Syringe-Needle U-100 (INSULIN SYRINGE .5CC/31GX5/16") 31G X 5/16" 0.5 ML  Have you contacted your pharmacy to request a refill? No   Which pharmacy would you like this sent to?      [id number] - [place], [place] - [address] AT [facility] [address] [place] [address] Phone: [phone] Fax: [phone] Patient notified that their request is being sent to the clinical staff for review and that they should receive a response within 2 business days.   Please advise at Mobile There is no such number on file (mobile).

## 2022-10-05 ENCOUNTER — Other Ambulatory Visit: Payer: Self-pay | Admitting: Internal Medicine

## 2022-10-06 ENCOUNTER — Ambulatory Visit: Payer: Self-pay

## 2022-10-06 NOTE — Patient Instructions (Addendum)
Visit Information  Thank you for taking time to visit with me today. Please don't hesitate to contact me if I can be of assistance to you.   Following are the goals we discussed today:  Continue to take medications as prescribed. Continue to attend provider visits as scheduled Continue to eat healthy, lean meats, vegetables, fruits, avoid saturated and transfats Continue to check blood sugar as recommended and notify provider if questions or concerns  If you are experiencing a Mental Health or Behavioral Health Crisis or need someone to talk to, please call the Suicide and Crisis Lifeline: 67  Kathyrn Sheriff, RN, MSN, BSN, [employer] [phone]

## 2022-10-06 NOTE — Patient Outreach (Signed)
  Care Coordination   Follow Up Visit Note   10/06/2022 Name: Connor Dixon MRN: [id number] DOB: 24-Sep-1934  TRESTYN CHILD is a 88 y.o. year old male who sees Etta Grandchild, MD for primary care. I spoke with  [name] by phone today.  What matters to the patients health and wellness today?  Mr. Gupte states he is checking blood sugars with his Dexcom meter. Per Mr. Stennis, he is not able to provide any readings, but states BS readings have been "alright". He denies any educational, care coordination or resource needs at this time. Mr. Malson to contact RNCM if any care coordination needs in the past.   Goals Addressed             This Visit's Progress    COMPLETED: Diabetes managment/education       Interventions Today    Flowsheet Row Most Recent Value  Chronic Disease   Chronic disease during today's visit Diabetes  General Interventions   General Interventions Discussed/Reviewed General Interventions Reviewed, Doctor Visits  Education Interventions   Education Provided Provided Education  [reiterated Care Coordination program and encouraged to call RNCM if care coordination needs in the future. confirmed patient has contact number.]  Provided Verbal Education On Blood Sugar Monitoring, Other  [encouraged to continue to check blood sugar as recommended, encouraged to continue to take medications as prescribed.]  Nutrition Interventions   Nutrition Discussed/Reviewed Nutrition Reviewed  Pharmacy Interventions   Pharmacy Dicussed/Reviewed Pharmacy Topics Reviewed            SDOH assessments and interventions completed:  No  Care Coordination Interventions:  Yes, provided   Follow up plan: No further intervention required.   Encounter Outcome:  Pt. Visit Completed   Kathyrn Sheriff, RN, MSN, BSN, CCM [facility] Care Coordinator [phone]

## 2022-10-13 ENCOUNTER — Other Ambulatory Visit: Payer: Self-pay | Admitting: Internal Medicine

## 2022-10-13 DIAGNOSIS — I10 Essential (primary) hypertension: Secondary | ICD-10-CM

## 2022-10-13 DIAGNOSIS — E0840 Diabetes mellitus due to underlying condition with diabetic neuropathy, unspecified: Secondary | ICD-10-CM

## 2022-10-13 DIAGNOSIS — M10071 Idiopathic gout, right ankle and foot: Secondary | ICD-10-CM

## 2022-11-10 ENCOUNTER — Encounter: Payer: Self-pay | Admitting: Internal Medicine

## 2022-11-10 ENCOUNTER — Ambulatory Visit: Payer: Medicare PPO | Attending: Internal Medicine | Admitting: Internal Medicine

## 2022-11-18 ENCOUNTER — Ambulatory Visit (INDEPENDENT_AMBULATORY_CARE_PROVIDER_SITE_OTHER): Payer: Medicare PPO

## 2022-11-18 DIAGNOSIS — I495 Sick sinus syndrome: Secondary | ICD-10-CM

## 2022-11-19 LAB — CUP PACEART REMOTE DEVICE CHECK
Battery Remaining Longevity: 32 mo
Battery Remaining Percentage: 51 %
Battery Voltage: 2.96 V
Brady Statistic AP VP Percent: 2.1 %
Brady Statistic AP VS Percent: 1 %
Brady Statistic AS VP Percent: 98 %
Brady Statistic AS VS Percent: 1 %
Brady Statistic RA Percent Paced: 1.8 %
Brady Statistic RV Percent Paced: 99 %
Date Time Interrogation Session: 20240705053818
Implantable Lead Connection Status: 753985
Implantable Lead Connection Status: 753985
Implantable Lead Implant Date: 20111202
Implantable Lead Implant Date: 20111202
Implantable Lead Location: 753859
Implantable Lead Location: 753860
Implantable Pulse Generator Implant Date: 20200930
Lead Channel Impedance Value: 360 Ohm
Lead Channel Impedance Value: 450 Ohm
Lead Channel Pacing Threshold Amplitude: 0.75 V
Lead Channel Pacing Threshold Amplitude: 2.5 V
Lead Channel Pacing Threshold Pulse Width: 0.4 ms
Lead Channel Pacing Threshold Pulse Width: 0.8 ms
Lead Channel Sensing Intrinsic Amplitude: 1.6 mV
Lead Channel Sensing Intrinsic Amplitude: 5.6 mV
Lead Channel Setting Pacing Amplitude: 2 V
Lead Channel Setting Pacing Amplitude: 2.75 V
Lead Channel Setting Pacing Pulse Width: 0.8 ms
Lead Channel Setting Sensing Sensitivity: 4 mV
Pulse Gen Model: 2272
Pulse Gen Serial Number: 9156495

## 2022-11-25 DIAGNOSIS — E103393 Type 1 diabetes mellitus with moderate nonproliferative diabetic retinopathy without macular edema, bilateral: Secondary | ICD-10-CM | POA: Diagnosis not present

## 2022-11-25 LAB — HM DIABETES EYE EXAM

## 2022-11-27 DIAGNOSIS — E119 Type 2 diabetes mellitus without complications: Secondary | ICD-10-CM | POA: Diagnosis not present

## 2022-12-03 ENCOUNTER — Other Ambulatory Visit: Payer: Self-pay | Admitting: Internal Medicine

## 2022-12-03 DIAGNOSIS — E119 Type 2 diabetes mellitus without complications: Secondary | ICD-10-CM

## 2022-12-05 NOTE — Progress Notes (Signed)
Remote pacemaker transmission.   

## 2022-12-14 ENCOUNTER — Ambulatory Visit: Payer: Medicare PPO | Attending: Internal Medicine | Admitting: Internal Medicine

## 2022-12-14 ENCOUNTER — Encounter: Payer: Self-pay | Admitting: Internal Medicine

## 2022-12-14 VITALS — BP 110/60 | HR 74 | Ht 68.0 in | Wt 212.0 lb

## 2022-12-14 DIAGNOSIS — I442 Atrioventricular block, complete: Secondary | ICD-10-CM | POA: Diagnosis not present

## 2022-12-14 NOTE — Patient Instructions (Signed)
Medication Instructions:  Your physician recommends that you continue on your current medications as directed. Please refer to the Current Medication list given to you today.  *If you need a refill on your cardiac medications before your next appointment, please call your pharmacy*  Lab Work: None ordered today.  Testing/Procedures: None ordered today.  Follow-Up: At [facility], you and your health needs are our priority.  As part of our continuing mission to provide you with exceptional heart care, we have created designated Provider Care Teams.  These Care Teams include your primary Cardiologist (physician) and Advanced Practice Providers (APPs -  Physician Assistants and Nurse Practitioners) who all work together to provide you with the care you need, when you need it.  Your next appointment:   1 year(s)  The format for your next appointment:   In Person  Provider:   You may see Lewayne Bunting, MD or one of the following Advanced Practice Providers on your designated Care Team:   Francis Dowse, [place] [address] [place], [place] Sherie Don, NP Canary Brim, NP{

## 2022-12-14 NOTE — Progress Notes (Signed)
HPI Connor Dixon returns today for followup. He is a pleasant 88 yo man with obesity, chronic systolic heart failure, HTN, and CHB, s/p PPM insertion. He also PAF. He has continue to lose weight. He denies chest pain or sob. No syncope.   Allergies  Allergen Reactions   Oysters [Shellfish Allergy]     Blisters and swelling   [place] [Dapagliflozin] Other (See Comments)    Low blood sugar   Apple Juice Other (See Comments)    Sore throat     Current Outpatient Medications  Medication Sig Dispense Refill   ACCU-CHEK GUIDE test strip TEST FOUR TIMES DAILY 200 strip 5   allopurinol (ZYLOPRIM) 100 MG tablet TAKE 1 TABLET(100 MG) BY MOUTH DAILY 90 tablet 1   amoxicillin (AMOXIL) 500 MG capsule TAKE ONE CAPSULE BY MOUTH THREE TIMES DAILY. TAKE ONLY WHEN HAVING DENTAL PROCEDURES 30 capsule 2   apixaban (ELIQUIS) 2.5 MG TABS tablet TAKE 1 TABLET(2.5 MG) BY MOUTH TWICE DAILY 60 tablet 3   BD INSULIN SYRINGE U/F 31G X 5/16" 0.5 ML MISC USE FOUR TIMES DAILY AS DIRECTED 200 each 3   blood glucose meter kit and supplies KIT Use to check blood sugar daily. DX: E11.9 1 each 0   calcitRIOL (ROCALTROL) 0.25 MCG capsule TAKE 1 CAPSULE(0.25 MCG) BY MOUTH DAILY (Patient taking differently: every other day.) 90 capsule 0   carvedilol (COREG) 6.25 MG tablet TAKE 1 TABLET(6.25 MG) BY MOUTH TWICE DAILY WITH A MEAL 180 tablet 0   Continuous Blood Gluc Receiver (DEXCOM G7 RECEIVER) DEVI 1 Act by Does not apply route daily. 9 each 1   Continuous Blood Gluc Sensor (DEXCOM G7 SENSOR) MISC 1 Act by Does not apply route daily. 9 each 1   furosemide (LASIX) 40 MG tablet Take 2 tablets in the morning and 1 tablet in the afternoon. 270 tablet 3   Glucagon (GVOKE HYPOPEN 2-PACK) 1 MG/0.2ML SOAJ Inject 1 Act into the skin daily as needed. 2 mL 5   insulin isophane & regular human KwikPen (HUMULIN 70/30 KWIKPEN) (70-30) 100 UNIT/ML KwikPen 40 units in AM 25 units in PM 60 mL 1   Insulin Pen Needle (B-D ULTRAFINE III  SHORT PEN) 31G X 8 MM MISC USE TO ADMINISTER INSULIN THREE TIMES DAILY 300 each 2   losartan (COZAAR) 25 MG tablet TAKE 1 TABLET(25 MG) BY MOUTH DAILY 90 tablet 0   pravastatin (PRAVACHOL) 40 MG tablet TAKE 1 TABLET(40 MG) BY MOUTH DAILY 90 tablet 1   No current facility-administered medications for this visit.     Past Medical History:  Diagnosis Date   Acute on chronic diastolic congestive heart failure (HCC)    Arthus phenomenon    Atrioventricular block, complete (HCC)    Cardiac pacemaker in situ 07/22/2010   Chronic kidney disease, stage 3 (HCC)    Presumably from longstanding DM & HTN. Scr has gradually increased over the past 2 years (1.6 in 12/2010, 2 in 03/2011, and up to 2.1 in 01/2013)   DIABETES MELLITUS, TYPE I, ADULT ONSET 08/06/2007   DM (diabetes mellitus) (HCC)    adult onset    Essential hypertension, benign 08/06/2007   Very good BP control   History of second degree heart block    HLD (hyperlipidemia)    HYPERLIPIDEMIA 08/06/2007   Hypopotassemia    Morbid obesity (HCC)    Obesity    Other and unspecified angina pectoris    typical   Type I (juvenile type)  diabetes mellitus without mention of complication, not stated as uncontrolled    (04/25/13 OV with Dr. Arrie Aran) = Poorly controlled & is working  w/Dr. Arthur Holms to improve this. Most recent Hgb A1c was 8.2% but Mr. Bathurst reports it was down to 7.6 last month.    ROS:   All systems reviewed and negative except as noted in the HPI.   Past Surgical History:  Procedure Laterality Date   CATARACT EXTRACTION  8/08   [place]   PPM GENERATOR CHANGEOUT N/A 02/13/2019   Procedure: PPM GENERATOR CHANGEOUT;  Surgeon: Marinus Maw, MD;  Location: [facility] INVASIVE CV LAB;  Service: Cardiovascular;  Laterality: N/A;   PTVDP  12/11   PPM - St. Jude   TOTAL HIP ARTHROPLASTY  04/05/06   right     Family History  Problem Relation Age of Onset   Asthma Father    Coronary artery disease Father    Diabetes  Father    Hyperlipidemia Father    Stroke Father 29   Coronary artery disease Mother    Asthma Mother    Heart attack Mother 78   Colon cancer Neg Hx        no definate prostate CA      Social History   Socioeconomic History   Marital status: Widowed    Spouse name: Not on file   Number of children: 2   Years of education: Not on file   Highest education level: Not on file  Occupational History   Occupation: retired  Tobacco Use   Smoking status: Never   Smokeless tobacco: Never  Vaping Use   Vaping status: Never Used  Substance and Sexual Activity   Alcohol use: No   Drug use: No   Sexual activity: Not Currently  Other Topics Concern   Not on file  Social History Narrative   [employer] BA, 2 Master's degrees. [place] - for certification Education specialist.    Retired Animal nutritionist.   Active - travels.   Married - 1961; 2 sons (1962 and 32) and 5 grandchildren.        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    Social Determinants of Health   Financial Resource Strain: Low Risk  (04/11/2022)   Overall Financial Resource Strain (CARDIA)    Difficulty of Paying Living Expenses: Not hard at all  Food Insecurity: No Food Insecurity (07/05/2022)   Hunger Vital Sign    Worried About Running Out of Food in the Last Year: Never true    Ran Out of Food in the Last Year: Never true  Transportation Needs: No Transportation Needs (07/05/2022)   PRAPARE - Administrator, Civil Service (Medical): No    Lack of Transportation (Non-Medical): No  Physical Activity: Inactive (04/11/2022)   Exercise Vital Sign    Days of Exercise per Week: 0 days    Minutes of Exercise per Session: 0 min  Stress: No Stress Concern Present (04/11/2022)   [employer] of Occupational Health - Occupational Stress Questionnaire    Feeling of Stress : Not at all  Social Connections: Moderately Isolated (04/11/2022)   Social Connection and Isolation Panel [NHANES]     Frequency of Communication with Friends and Family: Twice a week    Frequency of Social Gatherings with Friends and Family: Twice a week    Attends Religious Services: 1 to 4 times per year    Active Member of [employer] or Organizations: No    Attends [employer]  or Organization Meetings: Never    Marital Status: Widowed  Intimate Partner Violence: Not At Risk (04/11/2022)   Humiliation, Afraid, Rape, and Kick questionnaire    Fear of Current or Ex-Partner: No    Emotionally Abused: No    Physically Abused: No    Sexually Abused: No     BP 110/60   Pulse 74   Ht 5\' 8"  (1.727 m)   Wt 212 lb (96.2 kg)   SpO2 98%   BMI 32.23 kg/m   Physical Exam:  Well appearing NAD HEENT: Unremarkable Neck:  No JVD, no thyromegally Lymphatics:  No adenopathy Back:  No CVA tenderness Lungs:  Clear with no wheezes HEART:  Regular rate rhythm, no murmurs, no rubs, no clicks Abd:  soft, positive bowel sounds, no organomegally, no rebound, no guarding Ext:  2 plus pulses, no edema, no cyanosis, no clubbing Skin:  No rashes no nodules Neuro:  CN II through XII intact, motor grossly intact  DEVICE  Normal device function.  See PaceArt for details.   Assess/Plan: 1. CHB - he is asymptomatic,s/p PPM insertion. No escape today. 2. PPM - his St. Jude DDD PM is working normally. The noise on his lead is very brief and I have recommended watchful waiting. His impedences and threshold is stable. 3. PAF - he is maintaining NSR.  4. HTN - his sbp is well controlled. We will follow. No change in meds.  5. Obesity - he continues to lose weight. He is down 20 lbs in a year.   Sharlot Gowda Rodgerick Gilliand,MD

## 2022-12-22 ENCOUNTER — Other Ambulatory Visit: Payer: Self-pay | Admitting: Internal Medicine

## 2022-12-22 DIAGNOSIS — E559 Vitamin D deficiency, unspecified: Secondary | ICD-10-CM

## 2022-12-28 ENCOUNTER — Other Ambulatory Visit: Payer: Self-pay | Admitting: Internal Medicine

## 2022-12-28 DIAGNOSIS — M10071 Idiopathic gout, right ankle and foot: Secondary | ICD-10-CM

## 2022-12-28 DIAGNOSIS — M1A3790 Chronic gout due to renal impairment, unspecified ankle and foot, without tophus (tophi): Secondary | ICD-10-CM

## 2022-12-29 ENCOUNTER — Encounter (INDEPENDENT_AMBULATORY_CARE_PROVIDER_SITE_OTHER): Payer: Self-pay

## 2023-01-05 ENCOUNTER — Encounter: Payer: Self-pay | Admitting: Internal Medicine

## 2023-01-05 ENCOUNTER — Ambulatory Visit: Payer: Medicare PPO | Admitting: Internal Medicine

## 2023-01-05 ENCOUNTER — Ambulatory Visit (INDEPENDENT_AMBULATORY_CARE_PROVIDER_SITE_OTHER): Payer: Medicare PPO

## 2023-01-05 VITALS — BP 122/62 | HR 67 | Temp 97.9°F | Resp 16 | Ht 68.0 in | Wt 220.2 lb

## 2023-01-05 DIAGNOSIS — M19112 Post-traumatic osteoarthritis, left shoulder: Secondary | ICD-10-CM | POA: Diagnosis not present

## 2023-01-05 DIAGNOSIS — I10 Essential (primary) hypertension: Secondary | ICD-10-CM | POA: Diagnosis not present

## 2023-01-05 DIAGNOSIS — E1121 Type 2 diabetes mellitus with diabetic nephropathy: Secondary | ICD-10-CM | POA: Diagnosis not present

## 2023-01-05 DIAGNOSIS — Z794 Long term (current) use of insulin: Secondary | ICD-10-CM

## 2023-01-05 DIAGNOSIS — M25512 Pain in left shoulder: Secondary | ICD-10-CM

## 2023-01-05 DIAGNOSIS — E119 Type 2 diabetes mellitus without complications: Secondary | ICD-10-CM | POA: Diagnosis not present

## 2023-01-05 DIAGNOSIS — Z950 Presence of cardiac pacemaker: Secondary | ICD-10-CM | POA: Diagnosis not present

## 2023-01-05 DIAGNOSIS — M19012 Primary osteoarthritis, left shoulder: Secondary | ICD-10-CM | POA: Diagnosis not present

## 2023-01-05 DIAGNOSIS — E785 Hyperlipidemia, unspecified: Secondary | ICD-10-CM

## 2023-01-05 DIAGNOSIS — N1832 Chronic kidney disease, stage 3b: Secondary | ICD-10-CM | POA: Diagnosis not present

## 2023-01-05 NOTE — Patient Instructions (Signed)
Shoulder Pain Many things can cause shoulder pain, including: An injury to the shoulder. Overuse of the shoulder. Arthritis. The source of the pain can be: Inflammation. An injury to the shoulder joint. An injury to a tendon, ligament, or bone. Follow these instructions at home: Pay attention to changes in your symptoms. Let your health care provider know about them. Follow these instructions to relieve your pain. If you have a removable sling: Wear the sling as told by your provider. Remove it only as told by your provider. Check the skin around the sling every day. Tell your provider about any concerns. Loosen the sling if your fingers tingle, become numb, or become cold. Keep the sling clean. If the sling is not waterproof: Do not let it get wet. Remove it to shower or bathe. Move your arm as little as possible, but keep your hand moving to prevent swelling. Managing pain, stiffness, and swelling  If told, put ice on the painful area. If you have a removable sling or immobilizer, remove it as told by your provider. Put ice in a plastic bag. Place a towel between your skin and the bag. Leave the ice on for 20 minutes, 2-3 times a day. If your skin turns bright red, remove the ice right away to prevent skin damage. The risk of damage is higher if you cannot feel pain, heat, or cold. Move your fingers often to reduce stiffness and swelling. Squeeze a soft ball or a foam pad as much as possible. This helps to keep the shoulder from swelling. It also helps to strengthen the arm. General instructions Take over-the-counter and prescription medicines only as told by your provider. Exercise may help with pain management. Perform exercises if told by your provider. You may be referred to a physical therapist to help in your recovery process. Keep all follow-up visits in order to avoid any type of permanent shoulder disability or chronic pain problems. Contact a health care provider  if: Your pain is not relieved with medicines. New pain develops in your arm, hand, or fingers. You loosen your sling and your arm, hand, or fingers remain tingly, numb, swollen, or painful. Get help right away if: Your arm, hand, or fingers turn white or blue. This information is not intended to replace advice given to you by your health care provider. Make sure you discuss any questions you have with your health care provider. Document Revised: 12/03/2021 Document Reviewed: 12/03/2021 Elsevier Patient Education  2024 Elsevier Inc.  

## 2023-01-05 NOTE — Progress Notes (Signed)
Subjective:  Patient ID: Connor Dixon, male    DOB: [birth year]  Age: 88 y.o. MRN: [id number]  CC: Osteoarthritis and Hypertension   HPI Connor Dixon presents for f/up -----   Discussed the use of AI scribe software for clinical note transcription with the patient, who gave verbal consent to proceed.  History of Present Illness   The patient, aged 88, presents with severe pain in the left shoulder, which started approximately two weeks ago. The patient has a history of an injury to the same shoulder at the age of 67, but had not experienced any problems since the initial recovery until the recent onset of pain. The pain is localized to the shoulder muscle and extends to the neck area, but does not radiate further. The patient denies any recent injury to the shoulder and reports no issues with range of motion. Despite the pain, he is able to perform various movements with the shoulder without significant discomfort.  The patient has been managing the pain with over-the-counter Advil (Ibuprofen), taking one 200mg  dose approximately every eight hours. He is also on a blood thinner. Despite the use of Advil, he denies any symptoms of gastrointestinal upset such as nausea, vomiting, or abdominal pain. He also denies any changes in stool color or consistency that might suggest intestinal bleeding.  The patient has a history of cardiac issues, currently on his second pacemaker, but does not believe the shoulder pain is related to his heart. He also has a history of diabetes and is considering switching from insulin vials to pens due to increasing shakiness. He is also on a diuretic for an unspecified condition, but denies any current swelling in the legs.       Outpatient Medications Prior to Visit  Medication Sig Dispense Refill   ACCU-CHEK GUIDE test strip TEST FOUR TIMES DAILY 200 strip 5   allopurinol (ZYLOPRIM) 100 MG tablet TAKE 1 TABLET(100 MG) BY MOUTH DAILY 90 tablet 0   amoxicillin  (AMOXIL) 500 MG capsule TAKE ONE CAPSULE BY MOUTH THREE TIMES DAILY. TAKE ONLY WHEN HAVING DENTAL PROCEDURES 30 capsule 2   apixaban (ELIQUIS) 2.5 MG TABS tablet TAKE 1 TABLET(2.5 MG) BY MOUTH TWICE DAILY 60 tablet 3   BD INSULIN SYRINGE U/F 31G X 5/16" 0.5 ML MISC USE FOUR TIMES DAILY AS DIRECTED 200 each 3   blood glucose meter kit and supplies KIT Use to check blood sugar daily. DX: E11.9 1 each 0   calcitRIOL (ROCALTROL) 0.25 MCG capsule Take 1 capsule (0.25 mcg total) by mouth daily. Return in about 6 months (around 02/04/2023). 90 capsule 0   carvedilol (COREG) 6.25 MG tablet TAKE 1 TABLET(6.25 MG) BY MOUTH TWICE DAILY WITH A MEAL 180 tablet 0   furosemide (LASIX) 40 MG tablet Take 2 tablets in the morning and 1 tablet in the afternoon. 270 tablet 3   Glucagon (GVOKE HYPOPEN 2-PACK) 1 MG/0.2ML SOAJ Inject 1 Act into the skin daily as needed. 2 mL 5   Insulin Pen Needle (B-D ULTRAFINE III SHORT PEN) 31G X 8 MM MISC USE TO ADMINISTER INSULIN THREE TIMES DAILY 300 each 2   losartan (COZAAR) 25 MG tablet TAKE 1 TABLET(25 MG) BY MOUTH DAILY 90 tablet 0   Continuous Blood Gluc Receiver (DEXCOM G7 RECEIVER) DEVI 1 Act by Does not apply route daily. 9 each 1   Continuous Blood Gluc Sensor (DEXCOM G7 SENSOR) MISC 1 Act by Does not apply route daily. 9 each 1   insulin  isophane & regular human KwikPen (HUMULIN 70/30 KWIKPEN) (70-30) 100 UNIT/ML KwikPen 40 units in AM 25 units in PM 60 mL 1   pravastatin (PRAVACHOL) 40 MG tablet TAKE 1 TABLET(40 MG) BY MOUTH DAILY 90 tablet 1   No facility-administered medications prior to visit.    ROS Review of Systems  Constitutional: Negative.  Negative for diaphoresis, fatigue and fever.  HENT: Negative.    Eyes: Negative.   Respiratory: Negative.  Negative for cough, chest tightness, shortness of breath and wheezing.   Cardiovascular:  Negative for chest pain, palpitations and leg swelling.  Gastrointestinal:  Negative for abdominal pain, blood in stool,  constipation, diarrhea, nausea and vomiting.  Genitourinary: Negative.  Negative for difficulty urinating.  Musculoskeletal:  Positive for arthralgias and gait problem. Negative for back pain and myalgias.  Skin: Negative.   Neurological:  Negative for dizziness, weakness and light-headedness.  Hematological:  Negative for adenopathy. Does not bruise/bleed easily.  Psychiatric/Behavioral: Negative.      Objective:  BP 122/62 (BP Location: Left Arm, Patient Position: Sitting, Cuff Size: Normal)   Pulse 67   Temp 97.9 F (36.6 C) (Oral)   Resp 16   Ht 5\' 8"  (1.727 m)   Wt 220 lb 3.2 oz (99.9 kg)   SpO2 98%   BMI 33.48 kg/m   BP Readings from Last 3 Encounters:  01/05/23 122/62  12/14/22 110/60  08/23/22 112/70    Wt Readings from Last 3 Encounters:  01/05/23 220 lb 3.2 oz (99.9 kg)  12/14/22 212 lb (96.2 kg)  08/23/22 211 lb 12.8 oz (96.1 kg)    Physical Exam Vitals reviewed.  Constitutional:      Appearance: Normal appearance. He is not ill-appearing.  HENT:     Mouth/Throat:     Mouth: Mucous membranes are moist.  Eyes:     General: No scleral icterus.    Conjunctiva/sclera: Conjunctivae normal.  Cardiovascular:     Rate and Rhythm: Normal rate and regular rhythm.     Heart sounds: No murmur heard. Pulmonary:     Effort: Pulmonary effort is normal.     Breath sounds: No stridor. No wheezing, rhonchi or rales.  Abdominal:     General: Abdomen is flat.     Palpations: There is no mass.     Tenderness: There is no abdominal tenderness. There is no guarding.     Hernia: No hernia is present.  Musculoskeletal:        General: Normal range of motion.     Right shoulder: Normal. No tenderness. Normal range of motion.     Left shoulder: Normal. No tenderness. Normal range of motion.     Cervical back: Neck supple.     Right lower leg: No edema.     Left lower leg: No edema.  Lymphadenopathy:     Cervical: No cervical adenopathy.  Skin:    General: Skin is warm  and dry.     Findings: No lesion.  Neurological:     General: No focal deficit present.     Mental Status: He is alert. Mental status is at baseline.  Psychiatric:        Mood and Affect: Mood normal.        Behavior: Behavior normal.     Lab Results  Component Value Date   WBC 6.7 01/05/2023   HGB 13.7 01/05/2023   HCT 40.9 01/05/2023   PLT 196.0 01/05/2023   GLUCOSE 127 (H) 01/05/2023   CHOL 148 12/07/2021  TRIG 107.0 12/07/2021   HDL 46.60 12/07/2021   LDLCALC 80 12/07/2021   ALT 9 08/04/2022   AST 18 08/04/2022   NA 137 01/05/2023   K 4.6 01/05/2023   CL 96 01/05/2023   CREATININE 2.53 (H) 01/05/2023   BUN 48 (H) 01/05/2023   CO2 34 (H) 01/05/2023   TSH 2.01 08/04/2022   PSA 1.77 01/06/2010   HGBA1C 8.5 (H) 01/05/2023   MICROALBUR 24.1 (H) 08/04/2022    EP PPM/ICD IMPLANT  Result Date: 02/13/2019 Conclusion: Successful dual-chamber pacemaker generator removal and insertion of a new dual-chamber pacemaker in a patient with complete heart block who had reached elective replacement. Lewayne Bunting, MD  DG Shoulder Left  Result Date: 01/05/2023 CLINICAL DATA:  Left shoulder pain for 2 weeks EXAM: LEFT SHOULDER - 2+ VIEW COMPARISON:  None Available. FINDINGS: Frontal, transscapular, and axillary views of the left shoulder are obtained. Evaluation of the proximal left humerus is limited on the frontal view due to overlying pacemaker. No acute fracture, subluxation, or dislocation. Moderate acromioclavicular and glenohumeral joint osteoarthritis. Soft tissues are unremarkable. Multiple prior healed left rib fractures. Left chest is clear. IMPRESSION: 1. Moderate left shoulder osteoarthritis.  No acute fracture. Electronically Signed   By: Sharlet Salina M.D.   On: 01/05/2023 17:15     Assessment & Plan:   Essential hypertension- His blood pressure is adequately well-controlled. -     Basic metabolic panel; Future -     CBC with Differential/Platelet; Future  Stage 3b  chronic kidney disease (HCC)- His renal function is stable. -     Basic metabolic panel; Future -     CBC with Differential/Platelet; Future  Diabetic nephropathy associated with type 2 diabetes mellitus (HCC) -     Basic metabolic panel; Future  Insulin-requiring or dependent type II diabetes mellitus (HCC)- His blood sugar is adequately well-controlled. -     Basic metabolic panel; Future -     Hemoglobin A1c; Future -     HumuLIN 70/30 KwikPen; 40 units in AM 25 units in PM  Dispense: 60 mL; Refill: 1 -     Dexcom G7 Receiver; 1 Act by Does not apply route daily.  Dispense: 9 each; Refill: 1 -     Dexcom G7 Sensor; 1 Act by Does not apply route daily.  Dispense: 9 each; Refill: 1  Acute pain of left shoulder -     DG Shoulder Left; Future -     Ambulatory referral to Sports Medicine  Post-traumatic osteoarthritis of left shoulder -     Ambulatory referral to Sports Medicine  Hyperlipidemia with target LDL less than 70 -     Pravastatin Sodium; TAKE 1 TABLET(40 MG) BY MOUTH DAILY  Dispense: 90 tablet; Refill: 1     Follow-up: Return in about 4 months (around 05/07/2023).  Sanda Linger, MD

## 2023-01-06 LAB — CBC WITH DIFFERENTIAL/PLATELET
Basophils Absolute: 0.1 10*3/uL (ref 0.0–0.1)
Basophils Relative: 1 % (ref 0.0–3.0)
Eosinophils Absolute: 0.4 10*3/uL (ref 0.0–0.7)
Eosinophils Relative: 5.6 % — ABNORMAL HIGH (ref 0.0–5.0)
HCT: 40.9 % (ref 39.0–52.0)
Hemoglobin: 13.7 g/dL (ref 13.0–17.0)
Lymphocytes Relative: 26.4 % (ref 12.0–46.0)
Lymphs Abs: 1.8 10*3/uL (ref 0.7–4.0)
MCHC: 33.4 g/dL (ref 30.0–36.0)
MCV: 94.3 fl (ref 78.0–100.0)
Monocytes Absolute: 0.9 10*3/uL (ref 0.1–1.0)
Monocytes Relative: 12.7 % — ABNORMAL HIGH (ref 3.0–12.0)
Neutro Abs: 3.7 10*3/uL (ref 1.4–7.7)
Neutrophils Relative %: 54.3 % (ref 43.0–77.0)
Platelets: 196 10*3/uL (ref 150.0–400.0)
RBC: 4.34 Mil/uL (ref 4.22–5.81)
RDW: 14.2 % (ref 11.5–15.5)
WBC: 6.7 10*3/uL (ref 4.0–10.5)

## 2023-01-06 LAB — BASIC METABOLIC PANEL
BUN: 48 mg/dL — ABNORMAL HIGH (ref 6–23)
CO2: 34 meq/L — ABNORMAL HIGH (ref 19–32)
Calcium: 9.1 mg/dL (ref 8.4–10.5)
Chloride: 96 meq/L (ref 96–112)
Creatinine, Ser: 2.53 mg/dL — ABNORMAL HIGH (ref 0.40–1.50)
GFR: 22.46 mL/min — ABNORMAL LOW (ref >60.00)
Glucose, Bld: 127 mg/dL — ABNORMAL HIGH (ref 70–99)
Potassium: 4.6 meq/L (ref 3.5–5.1)
Sodium: 137 meq/L (ref 135–145)

## 2023-01-06 LAB — HEMOGLOBIN A1C: Hgb A1c MFr Bld: 8.5 % — ABNORMAL HIGH (ref 4.6–6.5)

## 2023-01-06 MED ORDER — HUMULIN 70/30 KWIKPEN (70-30) 100 UNIT/ML ~~LOC~~ SUPN
PEN_INJECTOR | SUBCUTANEOUS | 1 refills | Status: DC
Start: 2023-01-06 — End: 2023-02-08

## 2023-01-06 MED ORDER — PRAVASTATIN SODIUM 40 MG PO TABS
ORAL_TABLET | ORAL | 1 refills | Status: DC
Start: 2023-01-06 — End: 2023-03-02

## 2023-01-06 MED ORDER — DEXCOM G7 SENSOR MISC
1.0000 | Freq: Every day | 1 refills | Status: AC
Start: 2023-01-06 — End: ?

## 2023-01-06 MED ORDER — DEXCOM G7 RECEIVER DEVI
1.0000 | Freq: Every day | 1 refills | Status: DC
Start: 2023-01-06 — End: 2023-10-11

## 2023-01-09 NOTE — Progress Notes (Unsigned)
   Rubin Payor, PhD, LAT, ATC acting as a scribe for [name], MD.  Connor Dixon is a 88 y.o. male who presents to [facility] at [facility] today for L shoulder pain ongoing for about 3 wks. No MOI. Pt locates pain to ***  Neck pain: Radiates: Aggravates: Treatments tried: IBU  Dx imaging: 01/05/23 L shoulder XR  Pertinent review of systems: ***  Relevant historical information: ***   Exam:  There were no vitals taken for this visit. General: Well Developed, well nourished, and in no acute distress.   MSK: ***    Lab and Radiology Results No results found for this or any previous visit (from the past 72 hour(s)). DG Shoulder Left  Result Date: 01/05/2023 CLINICAL DATA:  Left shoulder pain for 2 weeks EXAM: LEFT SHOULDER - 2+ VIEW COMPARISON:  None Available. FINDINGS: Frontal, transscapular, and axillary views of the left shoulder are obtained. Evaluation of the proximal left humerus is limited on the frontal view due to overlying pacemaker. No acute fracture, subluxation, or dislocation. Moderate acromioclavicular and glenohumeral joint osteoarthritis. Soft tissues are unremarkable. Multiple prior healed left rib fractures. Left chest is clear. IMPRESSION: 1. Moderate left shoulder osteoarthritis.  No acute fracture. Electronically Signed   By: Sharlet Salina M.D.   On: 01/05/2023 17:15       Assessment and Plan: 88 y.o. male with ***   PDMP not reviewed this encounter. No orders of the defined types were placed in this encounter.  No orders of the defined types were placed in this encounter.    Discussed warning signs or symptoms. Please see discharge instructions. Patient expresses understanding.   ***

## 2023-01-10 ENCOUNTER — Ambulatory Visit: Payer: Medicare PPO | Admitting: Family Medicine

## 2023-01-10 ENCOUNTER — Other Ambulatory Visit: Payer: Self-pay

## 2023-01-10 ENCOUNTER — Ambulatory Visit (INDEPENDENT_AMBULATORY_CARE_PROVIDER_SITE_OTHER): Payer: Medicare PPO

## 2023-01-10 VITALS — BP 132/80 | HR 63 | Ht 68.0 in | Wt 219.0 lb

## 2023-01-10 DIAGNOSIS — M19112 Post-traumatic osteoarthritis, left shoulder: Secondary | ICD-10-CM

## 2023-01-10 DIAGNOSIS — M25512 Pain in left shoulder: Secondary | ICD-10-CM | POA: Diagnosis not present

## 2023-01-10 DIAGNOSIS — M542 Cervicalgia: Secondary | ICD-10-CM

## 2023-01-10 DIAGNOSIS — M47812 Spondylosis without myelopathy or radiculopathy, cervical region: Secondary | ICD-10-CM | POA: Diagnosis not present

## 2023-01-10 DIAGNOSIS — M5030 Other cervical disc degeneration, unspecified cervical region: Secondary | ICD-10-CM | POA: Diagnosis not present

## 2023-01-10 NOTE — Patient Instructions (Addendum)
Thank you for coming in today.   Please get an Xray today before you leave   You received an injection today. Seek immediate medical attention if the joint becomes red, extremely painful, or is oozing fluid.   I've referred you to Physical Therapy.  Let us know if you don't hear from them in one week.   Check back in 6 weeks   

## 2023-01-13 ENCOUNTER — Ambulatory Visit: Payer: Medicare PPO | Admitting: Physical Therapy

## 2023-01-13 ENCOUNTER — Encounter: Payer: Self-pay | Admitting: Physical Therapy

## 2023-01-13 ENCOUNTER — Other Ambulatory Visit: Payer: Self-pay

## 2023-01-13 DIAGNOSIS — M6281 Muscle weakness (generalized): Secondary | ICD-10-CM

## 2023-01-13 DIAGNOSIS — M25512 Pain in left shoulder: Secondary | ICD-10-CM | POA: Diagnosis not present

## 2023-01-13 NOTE — Therapy (Signed)
OUTPATIENT PHYSICAL THERAPY SHOULDER EVALUATION   Patient Name: Connor Dixon MRN: [id number] DOB:1935-02-10, 88 y.o., male Today's Date: 01/13/2023  END OF SESSION:  PT End of Session - 01/13/23 0830     Visit Number 1    Number of Visits 7    Date for PT Re-Evaluation 02/24/23    Authorization Type [employer]    Authorization Time Period auth tbd    Progress Note Due on Visit 10    PT Start Time 0832    PT Stop Time 0917    PT Time Calculation (min) 45 min    Activity Tolerance Patient tolerated treatment well;No increased pain    Behavior During Therapy WFL for tasks assessed/performed             Past Medical History:  Diagnosis Date   Acute on chronic diastolic congestive heart failure (HCC)    Arthus phenomenon    Atrioventricular block, complete (HCC)    Cardiac pacemaker in situ 07/22/2010   Chronic kidney disease, stage 3 (HCC)    Presumably from longstanding DM & HTN. Scr has gradually increased over the past 2 years (1.6 in 12/2010, 2 in 03/2011, and up to 2.1 in 01/2013)   DIABETES MELLITUS, TYPE I, ADULT ONSET 08/06/2007   DM (diabetes mellitus) (HCC)    adult onset    Essential hypertension, benign 08/06/2007   Very good BP control   History of second degree heart block    HLD (hyperlipidemia)    HYPERLIPIDEMIA 08/06/2007   Hypopotassemia    Morbid obesity (HCC)    Obesity    Other and unspecified angina pectoris    typical   Type I (juvenile type) diabetes mellitus without mention of complication, not stated as uncontrolled    (04/25/13 OV with Dr. Arrie Aran) = Poorly controlled & is working  w/Dr. Arthur Holms to improve this. Most recent Hgb A1c was 8.2% but Mr. Arguello reports it was down to 7.6 last month.   Past Surgical History:  Procedure Laterality Date   CATARACT EXTRACTION  8/08   [place]   PPM GENERATOR CHANGEOUT N/A 02/13/2019   Procedure: PPM GENERATOR CHANGEOUT;  Surgeon: Marinus Maw, MD;  Location: [facility] INVASIVE CV LAB;  Service:  Cardiovascular;  Laterality: N/A;   PTVDP  12/11   PPM - [employer] [facility]   TOTAL HIP ARTHROPLASTY  04/05/06   right   Patient Active Problem List   Diagnosis Date Noted   Acute pain of left shoulder 01/05/2023   Post-traumatic osteoarthritis of left shoulder 01/05/2023   DOE (dyspnea on exertion) 08/04/2022   Paroxysmal atrial fibrillation (HCC) 09/03/2021   Diabetic nephropathy associated with type 2 diabetes mellitus (HCC) 06/09/2021   Vitamin D deficiency disease 06/09/2021   Insulin-requiring or dependent type II diabetes mellitus (HCC) 06/09/2021   Encounter for general adult medical examination with abnormal findings 06/09/2020   Sinus node dysfunction (HCC) 02/13/2019   Kidney disease, chronic, stage III (GFR 30-59 ml/min) (HCC) 10/22/2013   PPM-[employer]ude 07/22/2010   Heart block AV complete (HCC) 07/13/2010   Diabetes mellitus with diabetic neuropathy (HCC) 08/06/2007   Hyperlipidemia with target LDL less than 70 08/06/2007   Morbid obesity (HCC) 08/06/2007   Essential hypertension 08/06/2007    PCP: Etta Grandchild, MD  REFERRING PROVIDER: Rodolph Bong, MD  REFERRING DIAG: [id number] (ICD-10-CM) - Acute pain of left shoulder  THERAPY DIAG:  Left shoulder pain, unspecified chronicity  Muscle weakness (generalized)  Rationale for Evaluation and Treatment: Rehabilitation  ONSET DATE: 3-4 weeks ago  SUBJECTIVE:                                                                                                                                                                                      SUBJECTIVE STATEMENT: Pt states he was in pretty severe pain for about 3 weeks - reports a L shoulder injury when he was 18 but no issues once that improved until a few weeks ago. States he was working in the yard and noticed the pain the following day. Notes he received an injection last week which has improved symptoms a fair bit. Endorses pretty good mobility overall but most difficulty with  reaching and pulling movements.  Denies any UE referral, no N/T. No fevers or chills.  Hand dominance: Right  PERTINENT HISTORY: CHF, AV block, pacemaker, CKD3, DM, HTN  PAIN:  Are you having pain: between 0-1/10 Location/description: L neck/shoulder (patient gestures to upper trap) Best-worst over past week: 0-9/10  - aggravating factors: reaching forward, pulling - Easing factors: medication, injections, heat/ice  PRECAUTIONS: cardiac hx w/ pacemaker   WEIGHT BEARING RESTRICTIONS: No  FALLS:  Has patient fallen in last 6 months? No  LIVING ENVIRONMENT: Lives alone in 2 level home, main level livable Typically independent with housework, has someone who assists with yardwork as needed  OCCUPATION: Retired - used to work in education  PLOF: Independent, uses cane oftentimes  PATIENT GOALS: enjoys gardening and working in yard  NEXT MD VISIT: October 8th   OBJECTIVE:   DIAGNOSTIC FINDINGS:  L shoulder XR 01/05/23 (refer to EPIC for details) "IMPRESSION: 1. Moderate left shoulder osteoarthritis.  No acute fracture."  PATIENT SURVEYS:  FOTO 64 current, 71 predicted  COGNITION: Overall cognitive status: Within functional limits for tasks assessed     SENSATION: No sensory complaints, light touch intact all UE dermatomes  POSTURE: Forward head, rounded shoulders  UPPER EXTREMITY ROM:  A/PROM Right eval Left eval  Shoulder flexion 148 deg 151 deg   Shoulder abduction 141 deg 138 deg   Shoulder internal rotation    Shoulder external rotation (functional combo) C5 C7  Elbow flexion    Elbow extension    Wrist flexion    Wrist extension     (Blank rows = not tested) (Key: WFL = within functional limits not formally assessed, * = concordant pain, s = stiffness/stretching sensation, NT = not tested)  Comments:    UPPER EXTREMITY MMT:  MMT Right eval Left eval  Shoulder flexion 5 4  Shoulder extension    Shoulder abduction 5 4-  Shoulder extension     Shoulder internal rotation 5 4  Shoulder  external rotation 5 4  Elbow flexion 5 5  Elbow extension 5 5  Grip strength    (Blank rows = not tested)  (Key: WFL = within functional limits not formally assessed, * = concordant pain, s = stiffness/stretching sensation, NT = not tested)  Comments:  reports some transient straining w/ all shoulder MMT but denies overt pain  PALPATION:  No significant TTP, increased tightness L UT compared to R    TODAY'S TREATMENT:                                                                                                                                         [facility] Adult PT Treatment:                                                DATE: 01/13/23 Therapeutic Exercise: Standing abduction isometric x5 cues for appropriate force output, breath control Standing shoulder flexion isometric x5 for appropriate force and performance HEP handout + education; education on relevant anatomy/physiology and rationale for interventions    PATIENT EDUCATION: Education details: Pt education on PT impairments, prognosis, and POC. Informed consent. Rationale for interventions, safe/appropriate HEP performance Person educated: Patient Education method: Explanation, Demonstration, Tactile cues, Verbal cues, and Handouts Education comprehension: verbalized understanding, returned demonstration, verbal cues required, tactile cues required, and needs further education    HOME EXERCISE PROGRAM: Access Code: [email or web address] URL: [email or web address] Date: 01/13/2023 Prepared by: Fransisco Hertz  Exercises - Standing Isometric Shoulder Flexion with Doorway - Arm Bent  - 2-3 x daily - 7 x weekly - 1 sets - 5 reps - 3 sec hold - Standing Isometric Shoulder Abduction with Doorway - Arm Bent  - 2-3 x daily - 7 x weekly - 1 sets - 5 reps - 3sec hold  ASSESSMENT:  CLINICAL IMPRESSION: Patient is a very pleasant 88 y.o. gentleman who was seen today for physical therapy  evaluation and treatment for L shoulder pain ongoing over past 3-4 weeks, he notes it seemed to have been preceded by heavier yardwork. He endorses good mobility but fairly consistent pain that has improved since injection. On exam he demonstrates GH mobility deficits that are grossly symmetrical, primary impairment is noted weakness of GH musculature on L compared to R and increased palpable tightness of periscapular musculature on L. Anticipate he will benefit from stability/strengthening of L shoulder/periscapular musculature, tolerates HEP well with no increase in resting pain and education is provided on safe/appropriate home performance. No adverse events. He does endorse some initial hesitancy re: frequency of PT as he states he is fairly busy, but is ultimately agreeable to plan for 1x/week to assess response and modify HEP accordingly. Recommend trial of skilled PT to address aforementioned impairments with aim of reducing pain levels and  improving functional tolerance. Pt departs today's session in no acute distress, all voiced questions/concerns addressed appropriately from PT perspective.    OBJECTIVE IMPAIRMENTS: decreased activity tolerance, decreased endurance, decreased mobility, decreased ROM, decreased strength, impaired UE functional use, postural dysfunction, and pain.   ACTIVITY LIMITATIONS: carrying, lifting, and reach over head  PARTICIPATION LIMITATIONS: meal prep, cleaning, laundry, and yard work  PERSONAL FACTORS: Age, Time since onset of injury/illness/exacerbation, and 3+ comorbidities: HTN, pacemaker, DM, CKD  are also affecting patient's functional outcome.   REHAB POTENTIAL: Good  CLINICAL DECISION MAKING: Stable/uncomplicated  EVALUATION COMPLEXITY: Low   GOALS: Goals reviewed with patient? Yes  SHORT TERM GOALS: Target date: 02/03/2023 Pt will demonstrate appropriate understanding and performance of initially prescribed HEP in order to facilitate improved  independence with management of symptoms.  Baseline: HEP provided on eval Goal status: INITIAL   2. Pt will report at least 25% decrease in overall pain levels in past week in order to facilitate improved tolerance to basic ADLs/mobility.   Baseline: 0-9/10  Goal status: INITIAL    LONG TERM GOALS: Target date: 02/24/2023   Pt will score 71 on FOTO in order to demonstrate improved perception of function due to symptoms. Baseline: 64 Goal status: INITIAL  2.  Pt will report at least 50% decrease in overall pain levels in past week in order to facilitate improved tolerance to basic ADLs/mobility.   Baseline: 0-9/10  Goal status: INITIAL    3.  Pt will demonstrate at least 4+/5 shoulder ER/IR MMT on LUE for improved symmetry of UE strength and improved tolerance to functional movements.  Baseline: see MMT chart above Goal status: INITIAL  4. Pt will demonstrate appropriate performance of final prescribed HEP in order to facilitate improved self-management of symptoms post-discharge.   Baseline: initial HEP prescribed  Goal status: INITIAL    5. Pt will endorse ability to fully participate in typical gardening/yardwork activities with less than 3 pt increase in shoulder pain in order to facilitate improved return to PLOF.   Baseline: up to 9/10 with usual activities  Goal status: INITIAL  PLAN:  PT FREQUENCY: 1x/week  PT DURATION: 6 weeks  PLANNED INTERVENTIONS: Therapeutic exercises, Therapeutic activity, Neuromuscular re-education, Balance training, Gait training, Patient/Family education, Self Care, Joint mobilization, Dry Needling, Spinal mobilization, Cryotherapy, Moist heat, Taping, Manual therapy, and Re-evaluation  PLAN FOR NEXT SESSION: Review/update HEP PRN. Work on [employer] exercises as appropriate with emphasis on rotator cuff stability, periscapular/GH strength. Symptom modification strategies as indicated/appropriate, mindful of cardiac history and  pacemaker.   Ashley Murrain PT, DPT 01/13/2023 12:14 PM    Referring diagnosis? M25.512 (ICD-10-CM) - Acute pain of left shoulder  Treatment diagnosis? (if different than referring diagnosis) Left shoulder pain, unspecified chronicity   Muscle weakness (generalized)  What was this (referring dx) caused by? []  Surgery []  Fall []  Ongoing issue []  Arthritis [x]  Other: ___heavy household activity______  Laterality: []  Rt [x]  Lt []  Both  Check all possible CPT codes:  *CHOOSE 10 OR LESS*    []  97110 (Therapeutic Exercise)  []  92507 (SLP Treatment)  []  97112 (Neuro Re-ed)   []  92526 (Swallowing Treatment)   []  97116 (Gait Training)   []  [id number] (Cognitive Training, 1st 15 minutes) []  97140 (Manual Therapy)   []  97130 (Cognitive Training, each add'l 15 minutes)  []  97164 (Re-evaluation)                              []   Other, List CPT Code ____________  []  97530 (Therapeutic Activities)     []  97535 (Self Care)   [x]  All codes above (97110 - 97535)  []  13086 (Mechanical Traction)  []  97014 (E-stim Unattended)  []  97032 (E-stim manual)  []  97033 (Ionto)  []  97035 (Ultrasound) [x]  97750 (Physical Performance Training) []  [id number] (Aquatic Therapy) []  97016 (Vasopneumatic Device) []  [id number] (Paraffin) []  97034 (Contrast Bath) []  97597 (Wound Care 1st 20 sq cm) []  97598 (Wound Care each add'l 20 sq cm) []  97760 (Orthotic Fabrication, Fitting, Training Initial) []  [id number] (Prosthetic Management and Training Initial) []  [id number] (Orthotic or Prosthetic Training/ Modification Subsequent)

## 2023-01-17 NOTE — Progress Notes (Signed)
Cervical spine x-ray shows arthritis throughout a fair amount of the spine.Connor Dixon

## 2023-01-18 ENCOUNTER — Other Ambulatory Visit: Payer: Self-pay | Admitting: Internal Medicine

## 2023-01-18 DIAGNOSIS — I10 Essential (primary) hypertension: Secondary | ICD-10-CM

## 2023-01-18 DIAGNOSIS — E0840 Diabetes mellitus due to underlying condition with diabetic neuropathy, unspecified: Secondary | ICD-10-CM

## 2023-01-18 DIAGNOSIS — M10071 Idiopathic gout, right ankle and foot: Secondary | ICD-10-CM

## 2023-01-24 ENCOUNTER — Telehealth: Payer: Self-pay | Admitting: Physical Therapy

## 2023-01-24 ENCOUNTER — Ambulatory Visit: Payer: Medicare PPO | Admitting: Physical Therapy

## 2023-01-24 ENCOUNTER — Encounter: Payer: Self-pay | Admitting: Physical Therapy

## 2023-01-24 DIAGNOSIS — M6281 Muscle weakness (generalized): Secondary | ICD-10-CM | POA: Diagnosis not present

## 2023-01-24 DIAGNOSIS — M25512 Pain in left shoulder: Secondary | ICD-10-CM | POA: Diagnosis not present

## 2023-01-24 NOTE — Therapy (Signed)
OUTPATIENT PHYSICAL THERAPY SHOULDER    Patient Name: Connor Dixon MRN: [id number] DOB:January 29, 1935, 88 y.o., male Today's Date: 01/24/2023  END OF SESSION:  PT End of Session - 01/24/23 0951     Visit Number 2    Number of Visits 7    Date for PT Re-Evaluation 02/24/23    Authorization Type [employer] medicare    Authorization Time Period auth tbd    Progress Note Due on Visit 10    PT Start Time 0935    PT Stop Time 1017    PT Time Calculation (min) 42 min    Activity Tolerance Patient tolerated treatment well    Behavior During Therapy [facility] for tasks assessed/performed              Past Medical History:  Diagnosis Date   Acute on chronic diastolic congestive heart failure (HCC)    Arthus phenomenon    Atrioventricular block, complete (HCC)    Cardiac pacemaker in situ 07/22/2010   Chronic kidney disease, stage 3 (HCC)    Presumably from longstanding DM & HTN. Scr has gradually increased over the past 2 years (1.6 in 12/2010, 2 in 03/2011, and up to 2.1 in 01/2013)   DIABETES MELLITUS, TYPE I, ADULT ONSET 08/06/2007   DM (diabetes mellitus) (HCC)    adult onset    Essential hypertension, benign 08/06/2007   Very good BP control   History of second degree heart block    HLD (hyperlipidemia)    HYPERLIPIDEMIA 08/06/2007   Hypopotassemia    Morbid obesity (HCC)    Obesity    Other and unspecified angina pectoris    typical   Type I (juvenile type) diabetes mellitus without mention of complication, not stated as uncontrolled    (04/25/13 OV with Dr. Arrie Aran) = Poorly controlled & is working  w/Dr. Arthur Holms to improve this. Most recent Hgb A1c was 8.2% but Mr. Pruyn reports it was down to 7.6 last month.   Past Surgical History:  Procedure Laterality Date   CATARACT EXTRACTION  8/08   [place]   PPM GENERATOR CHANGEOUT N/A 02/13/2019   Procedure: PPM GENERATOR CHANGEOUT;  Surgeon: Marinus Maw, MD;  Location: [facility] INVASIVE CV LAB;  Service: Cardiovascular;   Laterality: N/A;   PTVDP  12/11   PPM - [facility]   TOTAL HIP ARTHROPLASTY  04/05/06   right   Patient Active Problem List   Diagnosis Date Noted   Acute pain of left shoulder 01/05/2023   Post-traumatic osteoarthritis of left shoulder 01/05/2023   DOE (dyspnea on exertion) 08/04/2022   Paroxysmal atrial fibrillation (HCC) 09/03/2021   Diabetic nephropathy associated with type 2 diabetes mellitus (HCC) 06/09/2021   Vitamin D deficiency disease 06/09/2021   Insulin-requiring or dependent type II diabetes mellitus (HCC) 06/09/2021   Encounter for general adult medical examination with abnormal findings 06/09/2020   Sinus node dysfunction (HCC) 02/13/2019   Kidney disease, chronic, stage III (GFR 30-59 ml/min) (HCC) 10/22/2013   PPM-[facility] 07/22/2010   Heart block AV complete (HCC) 07/13/2010   Diabetes mellitus with diabetic neuropathy (HCC) 08/06/2007   Hyperlipidemia with target LDL less than 70 08/06/2007   Morbid obesity (HCC) 08/06/2007   Essential hypertension 08/06/2007    PCP: Etta Grandchild, MD  REFERRING PROVIDER: Rodolph Bong, MD  REFERRING DIAG: [phone] (ICD-10-CM) - Acute pain of left shoulder  THERAPY DIAG:  Left shoulder pain, unspecified chronicity  Muscle weakness (generalized)  Rationale for Evaluation and Treatment: Rehabilitation  ONSET  DATE: 3-4 weeks ago  SUBJECTIVE:                                                                                                                                                                                      SUBJECTIVE STATEMENT: Pt reporting 2-3/10 when first waking up. Pt stating his shoulder feels better after running hot water of it.      Eval: Pt states he was in pretty severe pain for about 3 weeks - reports a L shoulder injury when he was 18 but no issues once that improved until a few weeks ago. States he was working in the yard and noticed the pain the following day. Notes he received an injection  last week which has improved symptoms a fair bit. Endorses pretty good mobility overall but most difficulty with reaching and pulling movements.  Denies any UE referral, no N/T. No fevers or chills.  Hand dominance: Right  PERTINENT HISTORY: CHF, AV block, pacemaker, CKD3, DM, HTN  PAIN:  Are you having pain: between 0-1/10 Location/description: L neck/shoulder (patient gestures to upper trap) Best-worst over past week: 0-9/10  - aggravating factors: reaching forward, pulling - Easing factors: medication, injections, heat/ice  PRECAUTIONS: cardiac hx w/ pacemaker   WEIGHT BEARING RESTRICTIONS: No  FALLS:  Has patient fallen in last 6 months? No  LIVING ENVIRONMENT: Lives alone in 2 level home, main level livable Typically independent with housework, has someone who assists with yardwork as needed  OCCUPATION: Retired - used to work in education  PLOF: Independent, uses cane oftentimes  PATIENT GOALS: enjoys gardening and working in yard  NEXT MD VISIT: October 8th   OBJECTIVE:   DIAGNOSTIC FINDINGS:  L shoulder XR 01/05/23 (refer to EPIC for details) "IMPRESSION: 1. Moderate left shoulder osteoarthritis.  No acute fracture."  PATIENT SURVEYS:  FOTO 64 current, 71 predicted  COGNITION: Overall cognitive status: Within functional limits for tasks assessed     SENSATION: No sensory complaints, light touch intact all UE dermatomes  POSTURE: Forward head, rounded shoulders  UPPER EXTREMITY ROM:  A/PROM Right eval Left eval  Shoulder flexion 148 deg 151 deg   Shoulder abduction 141 deg 138 deg   Shoulder internal rotation    Shoulder external rotation (functional combo) C5 C7  Elbow flexion    Elbow extension    Wrist flexion    Wrist extension     (Blank rows = not tested) (Key: WFL = within functional limits not formally assessed, * = concordant pain, s = stiffness/stretching sensation, NT = not tested)  Comments:    UPPER EXTREMITY MMT:  MMT  Right eval Left eval  Shoulder flexion 5  4  Shoulder extension    Shoulder abduction 5 4-  Shoulder extension    Shoulder internal rotation 5 4  Shoulder external rotation 5 4  Elbow flexion 5 5  Elbow extension 5 5  Grip strength    (Blank rows = not tested)  (Key: WFL = within functional limits not formally assessed, * = concordant pain, s = stiffness/stretching sensation, NT = not tested)  Comments:  reports some transient straining w/ all shoulder MMT but denies overt pain  PALPATION:  No significant TTP, increased tightness L UT compared to R    TODAY'S TREATMENT:     [facility] Adult PT Treatment:                                                DATE: 01/24/23 Therapeutic Exercise: Nustep: Level 3, UE/LE x 8 minutes while discussing pt's schedule and POC Standing Rows: green TB 2 x 10 Seated bil ER 2 x 10 Supine ER AAROM using pt's cane 2 x 10  Supine AAROM shoulder flexion 2 x 10 Supine scapular stabilization shoulders at 90 degrees Chest press: using pt's cane 2 x 10                                                                                                                                           [facility] Adult PT Treatment:                                                DATE: 01/13/23 Therapeutic Exercise: Standing abduction isometric x5 cues for appropriate force output, breath control Standing shoulder flexion isometric x5 for appropriate force and performance HEP handout + education; education on relevant anatomy/physiology and rationale for interventions    PATIENT EDUCATION: Education details: Pt education on PT impairments, prognosis, and POC. Informed consent. Rationale for interventions, safe/appropriate HEP performance Person educated: Patient Education method: Explanation, Demonstration, Tactile cues, Verbal cues, and Handouts Education comprehension: verbalized understanding, returned demonstration, verbal cues required, tactile cues required, and needs  further education    HOME EXERCISE PROGRAM: Access Code: [email or web address] URL: [email or web address] Date: 01/24/2023 Prepared by: Narda Amber  Exercises - Standing Isometric Shoulder Flexion with Doorway - Arm Bent  - 2 x daily - 7 x weekly - 1 sets - 5 reps - 3 sec hold - Standing Isometric Shoulder Abduction with Doorway - Arm Bent  - 2 x daily - 7 x weekly - 1 sets - 5 reps - 3sec hold - Standing Shoulder Row with Anchored Resistance  - 1 x daily - 7 x weekly - 2 sets - 10 reps - 3  seconds hold - Shoulder External Rotation and Scapular Retraction with Resistance  - 2 x daily - 7 x weekly - 2 sets - 10 reps - Supine Shoulder Flexion Extension AAROM with Dowel  - 2 x daily - 7 x weekly - 2 sets - 10 reps  ASSESSMENT:  CLINICAL IMPRESSION: Pt tolerating exercises well this visit. Pt did report mild soreness at end of session but reporting self massage and heat helps. Pt's HEP was updated and new exercises added. Pt was issued both red and green TB and handout of his HEP. Recommend continue skilled PT inventions.      OBJECTIVE IMPAIRMENTS: decreased activity tolerance, decreased endurance, decreased mobility, decreased ROM, decreased strength, impaired UE functional use, postural dysfunction, and pain.   ACTIVITY LIMITATIONS: carrying, lifting, and reach over head  PARTICIPATION LIMITATIONS: meal prep, cleaning, laundry, and yard work  PERSONAL FACTORS: Age, Time since onset of injury/illness/exacerbation, and 3+ comorbidities: HTN, pacemaker, DM, CKD  are also affecting patient's functional outcome.   REHAB POTENTIAL: Good  CLINICAL DECISION MAKING: Stable/uncomplicated  EVALUATION COMPLEXITY: Low   GOALS: Goals reviewed with patient? Yes  SHORT TERM GOALS: Target date: 02/03/2023 Pt will demonstrate appropriate understanding and performance of initially prescribed HEP in order to facilitate improved independence with management of symptoms.  Baseline: HEP  provided on eval Goal status: INITIAL   2. Pt will report at least 25% decrease in overall pain levels in past week in order to facilitate improved tolerance to basic ADLs/mobility.   Baseline: 0-9/10  Goal status: INITIAL    LONG TERM GOALS: Target date: 02/24/2023   Pt will score 71 on FOTO in order to demonstrate improved perception of function due to symptoms. Baseline: 64 Goal status: INITIAL  2.  Pt will report at least 50% decrease in overall pain levels in past week in order to facilitate improved tolerance to basic ADLs/mobility.   Baseline: 0-9/10  Goal status: INITIAL    3.  Pt will demonstrate at least 4+/5 shoulder ER/IR MMT on LUE for improved symmetry of UE strength and improved tolerance to functional movements.  Baseline: see MMT chart above Goal status: INITIAL  4. Pt will demonstrate appropriate performance of final prescribed HEP in order to facilitate improved self-management of symptoms post-discharge.   Baseline: initial HEP prescribed  Goal status: INITIAL    5. Pt will endorse ability to fully participate in typical gardening/yardwork activities with less than 3 pt increase in shoulder pain in order to facilitate improved return to PLOF.   Baseline: up to 9/10 with usual activities  Goal status: INITIAL  PLAN:  PT FREQUENCY: 1x/week  PT DURATION: 6 weeks  PLANNED INTERVENTIONS: Therapeutic exercises, Therapeutic activity, Neuromuscular re-education, Balance training, Gait training, Patient/Family education, Self Care, Joint mobilization, Dry Needling, Spinal mobilization, Cryotherapy, Moist heat, Taping, Manual therapy, and Re-evaluation  PLAN FOR NEXT SESSION:  Work on ROM/strength exercises as appropriate with emphasis on rotator cuff stability, periscapular/GH strength. Symptom modification strategies as indicated/appropriate, cardiac history and pacemaker.   Narda Amber, PT, MPT 01/24/23 10:50 AM   01/24/2023 10:50 AM    Referring  diagnosis? M25.512 (ICD-10-CM) - Acute pain of left shoulder  Treatment diagnosis? (if different than referring diagnosis) Left shoulder pain, unspecified chronicity   Muscle weakness (generalized)  What was this (referring dx) caused by? []  Surgery []  Fall []  Ongoing issue []  Arthritis [x]  Other: ___heavy household activity______  Laterality: []  Rt [x]  Lt []  Both  Check all possible CPT codes:  *CHOOSE 10  OR LESS*    []  97110 (Therapeutic Exercise)  []  92507 (SLP Treatment)  []  [id number] (Neuro Re-ed)   []  92526 (Swallowing Treatment)   []  97116 (Gait Training)   []  [phone] (Cognitive Training, 1st 15 minutes) []  97140 (Manual Therapy)   []  97130 (Cognitive Training, each add'l 15 minutes)  []  97164 (Re-evaluation)                              []  Other, List CPT Code ____________  []  97530 (Therapeutic Activities)     []  97535 (Self Care)   [x]  All codes above (97110 - 97535)  []  97012 (Mechanical Traction)  []  97014 (E-stim Unattended)  []  97032 (E-stim manual)  []  97033 (Ionto)  []  97035 (Ultrasound) [x]  97750 (Physical Performance Training) []  [id number] (Aquatic Therapy) []  97016 (Vasopneumatic Device) []  [id number] (Paraffin) []  97034 (Contrast Bath) []  97597 (Wound Care 1st 20 sq cm) []  97598 (Wound Care each add'l 20 sq cm) []  97760 (Orthotic Fabrication, Fitting, Training Initial) []  [id number] (Prosthetic Management and Training Initial) []  01027 (Orthotic or Prosthetic Training/ Modification Subsequent)

## 2023-01-24 NOTE — Telephone Encounter (Signed)
Spoke with pt after he missed his 8:45 appt.  Insisted his appt was today at 9:45 (we do not have 9:45 appts), but offered 9:30 appt which he accepted.    Clarita Crane, PT, DPT 01/24/23 9:13 AM

## 2023-01-25 ENCOUNTER — Other Ambulatory Visit: Payer: Self-pay | Admitting: *Deleted

## 2023-01-25 MED ORDER — APIXABAN 2.5 MG PO TABS: 2.5000 mg | ORAL_TABLET | Freq: Two times a day (BID) | ORAL | 5 refills | Status: DC

## 2023-01-25 NOTE — Telephone Encounter (Signed)
Eliquis 2.5mg  refill request received. Patient is 88 years old, weight-99.3kg, Crea-2.53 on 01/05/23, Diagnosis-Afib, and last seen by Dr. Ladona Ridgel on 12/14/22. Dose is appropriate based on dosing criteria. Will send in refill to requested pharmacy.

## 2023-01-31 ENCOUNTER — Ambulatory Visit: Payer: Medicare PPO | Admitting: Internal Medicine

## 2023-01-31 ENCOUNTER — Encounter: Payer: Self-pay | Admitting: Physical Therapy

## 2023-01-31 ENCOUNTER — Ambulatory Visit: Payer: Medicare PPO | Admitting: Physical Therapy

## 2023-01-31 DIAGNOSIS — M25512 Pain in left shoulder: Secondary | ICD-10-CM | POA: Diagnosis not present

## 2023-01-31 DIAGNOSIS — M6281 Muscle weakness (generalized): Secondary | ICD-10-CM | POA: Diagnosis not present

## 2023-01-31 NOTE — Therapy (Signed)
OUTPATIENT PHYSICAL THERAPY TREATMENT   Patient Name: Connor Dixon MRN: [id number] DOB:[birth year], 88 y.o., male Today's Date: 01/31/2023  END OF SESSION:  PT End of Session - 01/31/23 0924     Visit Number 3    Number of Visits 7    Date for PT Re-Evaluation 02/24/23    Authorization Type humana medicare    Authorization Time Period auth tbd    Progress Note Due on Visit 10    PT Start Time 0925    PT Stop Time 1005    PT Time Calculation (min) 40 min    Activity Tolerance Patient tolerated treatment well    Behavior During Therapy [facility] for tasks assessed/performed               Past Medical History:  Diagnosis Date   Acute on chronic diastolic congestive heart failure (HCC)    Arthus phenomenon    Atrioventricular block, complete (HCC)    Cardiac pacemaker in situ 07/22/2010   Chronic kidney disease, stage 3 (HCC)    Presumably from longstanding DM & HTN. Scr has gradually increased over the past 2 years (1.6 in 12/2010, 2 in 03/2011, and up to 2.1 in 01/2013)   DIABETES MELLITUS, TYPE I, ADULT ONSET 08/06/2007   DM (diabetes mellitus) (HCC)    adult onset    Essential hypertension, benign 08/06/2007   Very good BP control   History of second degree heart block    HLD (hyperlipidemia)    HYPERLIPIDEMIA 08/06/2007   Hypopotassemia    Morbid obesity (HCC)    Obesity    Other and unspecified angina pectoris    typical   Type I (juvenile type) diabetes mellitus without mention of complication, not stated as uncontrolled    (04/25/13 OV with Dr. Arrie Aran) = Poorly controlled & is working  w/Dr. Arthur Holms to improve this. Most recent Hgb A1c was 8.2% but Mr. Nitcher reports it was down to 7.6 last month.   Past Surgical History:  Procedure Laterality Date   CATARACT EXTRACTION  8/08   [place]   PPM GENERATOR CHANGEOUT N/A 02/13/2019   Procedure: PPM GENERATOR CHANGEOUT;  Surgeon: Marinus Maw, MD;  Location: [facility] INVASIVE CV LAB;  Service: Cardiovascular;   Laterality: N/A;   PTVDP  12/11   PPM - [facility]   TOTAL HIP ARTHROPLASTY  04/05/06   right   Patient Active Problem List   Diagnosis Date Noted   Acute pain of left shoulder 01/05/2023   Post-traumatic osteoarthritis of left shoulder 01/05/2023   DOE (dyspnea on exertion) 08/04/2022   Paroxysmal atrial fibrillation (HCC) 09/03/2021   Diabetic nephropathy associated with type 2 diabetes mellitus (HCC) 06/09/2021   Vitamin D deficiency disease 06/09/2021   Insulin-requiring or dependent type II diabetes mellitus (HCC) 06/09/2021   Encounter for general adult medical examination with abnormal findings 06/09/2020   Sinus node dysfunction (HCC) 02/13/2019   Kidney disease, chronic, stage III (GFR 30-59 ml/min) (HCC) 10/22/2013   PPM-[facility] 07/22/2010   Heart block AV complete (HCC) 07/13/2010   Diabetes mellitus with diabetic neuropathy (HCC) 08/06/2007   Hyperlipidemia with target LDL less than 70 08/06/2007   Morbid obesity (HCC) 08/06/2007   Essential hypertension 08/06/2007    PCP: Etta Grandchild, MD  REFERRING PROVIDER: Rodolph Bong, MD  REFERRING DIAG: [phone] (ICD-10-CM) - Acute pain of left shoulder  THERAPY DIAG:  Left shoulder pain, unspecified chronicity  Muscle weakness (generalized)  Rationale for Evaluation and Treatment: Rehabilitation  ONSET  DATE: 3-4 weeks ago  SUBJECTIVE:                                                                                                                                                                                      SUBJECTIVE STATEMENT: Having some back pain around his kidney; and glucose was uncontrolled last night.  Has been doing his HEP everyday but yesterday.    Eval: Pt states he was in pretty severe pain for about 3 weeks - reports a L shoulder injury when he was 18 but no issues once that improved until a few weeks ago. States he was working in the yard and noticed the pain the following day. Notes he received  an injection last week which has improved symptoms a fair bit. Endorses pretty good mobility overall but most difficulty with reaching and pulling movements.  Denies any UE referral, no N/T. No fevers or chills.  Hand dominance: Right  PERTINENT HISTORY: CHF, AV block, pacemaker, CKD3, DM, HTN  PAIN:  Are you having pain: between 0-1/10 Location/description: L neck/shoulder (patient gestures to upper trap) Best-worst over past week: 0-9/10  - aggravating factors: reaching forward, pulling - Easing factors: medication, injections, heat/ice  PRECAUTIONS: cardiac hx w/ pacemaker   WEIGHT BEARING RESTRICTIONS: No  FALLS:  Has patient fallen in last 6 months? No  LIVING ENVIRONMENT: Lives alone in 2 level home, main level livable Typically independent with housework, has someone who assists with yardwork as needed  OCCUPATION: Retired - used to work in education  PLOF: Independent, uses cane oftentimes  PATIENT GOALS: enjoys gardening and working in yard  NEXT MD VISIT: October 8th   OBJECTIVE:   DIAGNOSTIC FINDINGS:  L shoulder XR 01/05/23 (refer to EPIC for details) "IMPRESSION: 1. Moderate left shoulder osteoarthritis.  No acute fracture."  PATIENT SURVEYS:  FOTO 64 current, 71 predicted  COGNITION: Overall cognitive status: Within functional limits for tasks assessed     SENSATION: No sensory complaints, light touch intact all UE dermatomes  POSTURE: Forward head, rounded shoulders  UPPER EXTREMITY ROM:  A/PROM Right eval Left eval Left 01/31/23  Shoulder flexion 148 deg 151 deg  138 deg (Sitting)  Shoulder abduction 141 deg 138 deg  140 deg (Sitting)  Shoulder internal rotation     Shoulder external rotation (functional combo) C5 C7    (Blank rows = not tested) (Key: WFL = within functional limits not formally assessed, * = concordant pain, s = stiffness/stretching sensation, NT = not tested)  Comments:    UPPER EXTREMITY MMT:  MMT Right eval  Left eval  Shoulder flexion 5 4  Shoulder extension    Shoulder abduction  5 4-  Shoulder extension    Shoulder internal rotation 5 4  Shoulder external rotation 5 4  Elbow flexion 5 5  Elbow extension 5 5  Grip strength    (Blank rows = not tested)  (Key: WFL = within functional limits not formally assessed, * = concordant pain, s = stiffness/stretching sensation, NT = not tested)  Comments:  reports some transient straining w/ all shoulder MMT but denies overt pain  PALPATION:  No significant TTP, increased tightness L UT compared to R    TODAY'S TREATMENT:     01/31/23 TherEx UBE L2 x 6 min (3 min each direction) Rows L3 band 2x10; 3 sec hold Shoulder extension L3 band 2x10 Seated bil ER 2x10 L3 band Seated shoulder flexion to 90 deg 2x10; 2# Seated shoulder abduction to 90 deg 2x10, 2# Bicep curls with overhead press 2# 2x10 Supine shoulder circles in 90 deg flexion 2# x 20 CW/CCW on Lt Supine chest press 2# in each hand 2x10 Supine shoulder flexion on Lt with 2# and 5 sec end range hold 2x10     01/24/23 Therapeutic Exercise: Nustep: Level 3, UE/LE x 8 minutes while discussing pt's schedule and POC Standing Rows: green TB 2 x 10 Seated bil ER 2 x 10 Supine ER AAROM using pt's cane 2 x 10  Supine AAROM shoulder flexion 2 x 10 Supine scapular stabilization shoulders at 90 degrees Chest press: using pt's cane 2 x 10                                                                                                                                           01/13/23 Therapeutic Exercise: Standing abduction isometric x5 cues for appropriate force output, breath control Standing shoulder flexion isometric x5 for appropriate force and performance HEP handout + education; education on relevant anatomy/physiology and rationale for interventions    PATIENT EDUCATION: Education details: Pt education on PT impairments, prognosis, and POC. Informed consent. Rationale for  interventions, safe/appropriate HEP performance Person educated: Patient Education method: Explanation, Demonstration, Tactile cues, Verbal cues, and Handouts Education comprehension: verbalized understanding, returned demonstration, verbal cues required, tactile cues required, and needs further education    HOME EXERCISE PROGRAM: Access Code: [email or web address] URL: [email or web address] Date: 01/24/2023 Prepared by: Narda Amber  Exercises - Standing Isometric Shoulder Flexion with Doorway - Arm Bent  - 2 x daily - 7 x weekly - 1 sets - 5 reps - 3 sec hold - Standing Isometric Shoulder Abduction with Doorway - Arm Bent  - 2 x daily - 7 x weekly - 1 sets - 5 reps - 3sec hold - Standing Shoulder Row with Anchored Resistance  - 1 x daily - 7 x weekly - 2 sets - 10 reps - 3 seconds hold - Shoulder External Rotation and Scapular Retraction with Resistance  - 2 x daily - 7 x weekly - 2  sets - 10 reps - Supine Shoulder Flexion Extension AAROM with Dowel  - 2 x daily - 7 x weekly - 2 sets - 10 reps  ASSESSMENT:  CLINICAL IMPRESSION: Pt tolerated exercises well without pain today, and feel he may be ready for d/c next visit with plan to continue strengthening with HEP.  Will continue to benefit from PT to maximize function.   OBJECTIVE IMPAIRMENTS: decreased activity tolerance, decreased endurance, decreased mobility, decreased ROM, decreased strength, impaired UE functional use, postural dysfunction, and pain.   ACTIVITY LIMITATIONS: carrying, lifting, and reach over head  PARTICIPATION LIMITATIONS: meal prep, cleaning, laundry, and yard work  PERSONAL FACTORS: Age, Time since onset of injury/illness/exacerbation, and 3+ comorbidities: HTN, pacemaker, DM, CKD  are also affecting patient's functional outcome.   REHAB POTENTIAL: Good  CLINICAL DECISION MAKING: Stable/uncomplicated  EVALUATION COMPLEXITY: Low   GOALS: Goals reviewed with patient? Yes  SHORT TERM GOALS: Target  date: 02/03/2023 Pt will demonstrate appropriate understanding and performance of initially prescribed HEP in order to facilitate improved independence with management of symptoms.  Baseline: HEP provided on eval Goal status: INITIAL   2. Pt will report at least 25% decrease in overall pain levels in past week in order to facilitate improved tolerance to basic ADLs/mobility.   Baseline: 0-9/10  Goal status: INITIAL    LONG TERM GOALS: Target date: 02/24/2023   Pt will score 71 on FOTO in order to demonstrate improved perception of function due to symptoms. Baseline: 64 Goal status: INITIAL  2.  Pt will report at least 50% decrease in overall pain levels in past week in order to facilitate improved tolerance to basic ADLs/mobility.   Baseline: 0-9/10  Goal status: INITIAL    3.  Pt will demonstrate at least 4+/5 shoulder ER/IR MMT on LUE for improved symmetry of UE strength and improved tolerance to functional movements.  Baseline: see MMT chart above Goal status: INITIAL  4. Pt will demonstrate appropriate performance of final prescribed HEP in order to facilitate improved self-management of symptoms post-discharge.   Baseline: initial HEP prescribed  Goal status: INITIAL    5. Pt will endorse ability to fully participate in typical gardening/yardwork activities with less than 3 pt increase in shoulder pain in order to facilitate improved return to PLOF.   Baseline: up to 9/10 with usual activities  Goal status: INITIAL  PLAN:  PT FREQUENCY: 1x/week  PT DURATION: 6 weeks  PLANNED INTERVENTIONS: Therapeutic exercises, Therapeutic activity, Neuromuscular re-education, Balance training, Gait training, Patient/Family education, Self Care, Joint mobilization, Dry Needling, Spinal mobilization, Cryotherapy, Moist heat, Taping, Manual therapy, and Re-evaluation  PLAN FOR NEXT SESSION:  possible d/c next visit,  Work on ROM/strength exercises as appropriate with emphasis on rotator cuff  stability, periscapular/GH strength. Symptom modification strategies as indicated/appropriate, cardiac history and pacemaker.     Clarita Crane, PT, DPT 01/31/23 10:16 AM

## 2023-02-07 ENCOUNTER — Encounter: Payer: Self-pay | Admitting: Physical Therapy

## 2023-02-07 ENCOUNTER — Ambulatory Visit: Payer: Medicare PPO | Admitting: Physical Therapy

## 2023-02-07 DIAGNOSIS — M6281 Muscle weakness (generalized): Secondary | ICD-10-CM

## 2023-02-07 DIAGNOSIS — M25512 Pain in left shoulder: Secondary | ICD-10-CM | POA: Diagnosis not present

## 2023-02-08 ENCOUNTER — Telehealth: Payer: Self-pay | Admitting: Internal Medicine

## 2023-02-08 DIAGNOSIS — E119 Type 2 diabetes mellitus without complications: Secondary | ICD-10-CM

## 2023-02-08 DIAGNOSIS — E0840 Diabetes mellitus due to underlying condition with diabetic neuropathy, unspecified: Secondary | ICD-10-CM

## 2023-02-08 MED ORDER — HUMULIN 70/30 KWIKPEN (70-30) 100 UNIT/ML ~~LOC~~ SUPN: PEN_INJECTOR | SUBCUTANEOUS | 1 refills | Status: DC

## 2023-02-08 NOTE — Telephone Encounter (Signed)
Patient said he would like to switch from using vials to pens for his insulin use. He would like to speak with the nurse about that. He said he mentioned it at his last appointment, but didn't hear anything back about it. Patient would like a call back at [phone].

## 2023-02-08 NOTE — Telephone Encounter (Signed)
Pt has been informed that 70/30 Humulin Stephanie Coup was sent on 8/23 to [facility]. Pt stated they did not have a Rx for the pens.   I have resent Rx.

## 2023-02-13 MED ORDER — BD PEN NEEDLE SHORT U/F 31G X 8 MM MISC: 2 refills | Status: DC

## 2023-02-13 NOTE — Telephone Encounter (Signed)
Patient called back and states that he needs the short pen needles sent in - please call patient when this has been done - [phone]

## 2023-02-13 NOTE — Addendum Note (Signed)
Addended by: Darryll Capers on: 02/13/2023 11:00 AM   Modules accepted: Orders

## 2023-02-13 NOTE — Telephone Encounter (Signed)
Pt informed that pen needles have been sent to [facility] on [address].

## 2023-02-16 ENCOUNTER — Other Ambulatory Visit: Payer: Self-pay | Admitting: Internal Medicine

## 2023-02-16 DIAGNOSIS — I10 Essential (primary) hypertension: Secondary | ICD-10-CM

## 2023-02-18 LAB — CUP PACEART REMOTE DEVICE CHECK
Battery Remaining Longevity: 43 mo
Battery Remaining Percentage: 46 %
Battery Voltage: 2.96 V
Brady Statistic AP VP Percent: 2.7 %
Brady Statistic AP VS Percent: 1 %
Brady Statistic AS VP Percent: 97 %
Brady Statistic AS VS Percent: 1 %
Brady Statistic RA Percent Paced: 2.3 %
Brady Statistic RV Percent Paced: 99 %
Date Time Interrogation Session: 20241004040022
Implantable Lead Connection Status: 753985
Implantable Lead Connection Status: 753985
Implantable Lead Implant Date: 20111202
Implantable Lead Implant Date: 20111202
Implantable Lead Location: 753859
Implantable Lead Location: 753860
Implantable Pulse Generator Implant Date: 20200930
Lead Channel Impedance Value: 390 Ohm
Lead Channel Impedance Value: 460 Ohm
Lead Channel Pacing Threshold Amplitude: 0.75 V
Lead Channel Pacing Threshold Amplitude: 2 V
Lead Channel Pacing Threshold Pulse Width: 0.4 ms
Lead Channel Pacing Threshold Pulse Width: 0.8 ms
Lead Channel Sensing Intrinsic Amplitude: 1.5 mV
Lead Channel Sensing Intrinsic Amplitude: 7.7 mV
Lead Channel Setting Pacing Amplitude: 2 V
Lead Channel Setting Pacing Amplitude: 2.25 V
Lead Channel Setting Pacing Pulse Width: 0.8 ms
Lead Channel Setting Sensing Sensitivity: 4 mV
Pulse Gen Model: 2272
Pulse Gen Serial Number: 9156495

## 2023-02-20 ENCOUNTER — Ambulatory Visit (INDEPENDENT_AMBULATORY_CARE_PROVIDER_SITE_OTHER): Payer: Medicare PPO

## 2023-02-20 DIAGNOSIS — I442 Atrioventricular block, complete: Secondary | ICD-10-CM | POA: Diagnosis not present

## 2023-02-20 NOTE — Progress Notes (Unsigned)
   Rubin Payor, PhD, LAT, ATC acting as a scribe for [name], MD.  Connor Dixon is a 88 y.o. male who presents to [facility] at [facility] today for f/u L shoulder and trapz pain. Pt was last seen by Dr. Denyse Amass on 01/10/23 and was given a L subacromial steroid injection and was referred to PT, completing 4 visits.   Today, pt reports his L shoulder area is feeling much better. He is happy w/ his progress.  Dx imaging: 01/10/23 C-spine XR 01/05/23 L shoulder XR   Pertinent review of systems: No fevers or chills  Relevant historical information: Diabetes   Exam:  BP 108/62   Pulse 73   Ht 5\' 8"  (1.727 m)   Wt 208 lb (94.3 kg)   SpO2 97%   BMI 31.63 kg/m  General: Well Developed, well nourished, and in no acute distress.   MSK: Left shoulder normal-appearing normal motion intact strength.    Lab and Radiology Results  EXAM: LEFT SHOULDER - 2+ VIEW   COMPARISON:  None Available.   FINDINGS: Frontal, transscapular, and axillary views of the left shoulder are obtained. Evaluation of the proximal left humerus is limited on the frontal view due to overlying pacemaker. No acute fracture, subluxation, or dislocation. Moderate acromioclavicular and glenohumeral joint osteoarthritis. Soft tissues are unremarkable. Multiple prior healed left rib fractures. Left chest is clear.   IMPRESSION: 1. Moderate left shoulder osteoarthritis.  No acute fracture.     Electronically Signed   By: Sharlet Salina M.D.   On: 01/05/2023 17:15   I, [name], personally (independently) visualized and performed the interpretation of the images attached in this note.    Assessment and Plan: 88 y.o. male with left shoulder pain thought to be due to chronic rotator cuff tendinopathy.  Significant improvement with subacromial injection about 6 weeks ago.  He has done physical therapy as well and has now been discharged home exercise program.  He notes significant  symptom improvement.  Check back as needed continue home exercise program.  Can repeat steroid injection if needed at 3 months which would be late November.   PDMP not reviewed this encounter. No orders of the defined types were placed in this encounter.  No orders of the defined types were placed in this encounter.    Discussed warning signs or symptoms. Please see discharge instructions. Patient expresses understanding.   The above documentation has been reviewed and is accurate and complete [name], M.D.

## 2023-02-21 ENCOUNTER — Ambulatory Visit: Payer: Medicare PPO | Admitting: Family Medicine

## 2023-02-21 VITALS — BP 108/62 | HR 73 | Ht 68.0 in | Wt 208.0 lb

## 2023-02-21 DIAGNOSIS — M25512 Pain in left shoulder: Secondary | ICD-10-CM

## 2023-02-21 DIAGNOSIS — M19112 Post-traumatic osteoarthritis, left shoulder: Secondary | ICD-10-CM

## 2023-02-21 NOTE — Patient Instructions (Addendum)
Thank you for coming in today.   I can do the same injection again when we need to do as soon as late November.   Please let me know if you have any problems with this shoulder or other issues.

## 2023-03-02 ENCOUNTER — Other Ambulatory Visit: Payer: Self-pay | Admitting: Internal Medicine

## 2023-03-02 DIAGNOSIS — E785 Hyperlipidemia, unspecified: Secondary | ICD-10-CM

## 2023-03-03 NOTE — Progress Notes (Signed)
Remote pacemaker transmission.   

## 2023-04-04 ENCOUNTER — Other Ambulatory Visit: Payer: Self-pay

## 2023-04-04 ENCOUNTER — Ambulatory Visit: Payer: Medicare PPO | Admitting: Family Medicine

## 2023-04-04 VITALS — BP 134/70 | HR 86 | Ht 68.0 in | Wt 210.0 lb

## 2023-04-04 DIAGNOSIS — M25512 Pain in left shoulder: Secondary | ICD-10-CM | POA: Diagnosis not present

## 2023-04-04 DIAGNOSIS — M19112 Post-traumatic osteoarthritis, left shoulder: Secondary | ICD-10-CM | POA: Diagnosis not present

## 2023-04-04 DIAGNOSIS — G8929 Other chronic pain: Secondary | ICD-10-CM | POA: Diagnosis not present

## 2023-04-04 NOTE — Progress Notes (Signed)
Connor Dixon, am serving as a Neurosurgeon for Dr. Clementeen Dixon.  Connor Dixon is a 88 y.o. male who presents to [facility] at [facility] today for f/u L shoulder pain. Pt was last seen by Dr. Denyse Dixon on 02/21/23 and his L shoulder was feeling much better. D/c from PT on 02/07/23. Last L subacromial steroid injection was on 01/10/23.  Today, pt reports that the left shoulder started bothering him agian about a week ago and is ready for the injection again   Dx imaging: 01/10/23 C-spine XR 01/05/23 L shoulder XR  Pertinent review of systems: No fevers or chills  Relevant historical information: Diabetes and CKD.   Exam:  BP 134/70   Pulse 86   Ht 5\' 8"  (1.727 m)   Wt 210 lb (95.3 kg)   SpO2 99%   BMI 31.93 kg/m  General: Well Developed, well nourished, and in no acute distress.   MSK: Left shoulder normal-appearing normal motion pain with abduction.    Lab and Radiology Results  Procedure: Real-time Ultrasound Guided Injection of left shoulder glenohumeral joint posterior approach Device: Philips Affiniti 50G/GE Logiq Images permanently stored and available for review in PACS Verbal informed consent obtained.  Discussed risks and benefits of procedure. Warned about infection, bleeding, hyperglycemia damage to structures among others. Patient expresses understanding and agreement Time-out conducted.   Noted no overlying erythema, induration, or other signs of local infection.   Skin prepped in a sterile fashion.   Local anesthesia: Topical Ethyl chloride.   With sterile technique and under real time ultrasound guidance: 40 mg of Kenalog and 2 mL of Marcaine injected into glenohumeral joint. Fluid seen entering the joint capsule.   Completed without difficulty   Pain immediately resolved suggesting accurate placement of the medication.   Advised to call if fevers/chills, erythema, induration, drainage, or persistent bleeding.   Images permanently stored and  available for review in the ultrasound unit.  Impression: Technically successful ultrasound guided injection.   EXAM: LEFT SHOULDER - 2+ VIEW   COMPARISON:  None Available.   FINDINGS: Frontal, transscapular, and axillary views of the left shoulder are obtained. Evaluation of the proximal left humerus is limited on the frontal view due to overlying pacemaker. No acute fracture, subluxation, or dislocation. Moderate acromioclavicular and glenohumeral joint osteoarthritis. Soft tissues are unremarkable. Multiple prior healed left rib fractures. Left chest is clear.   IMPRESSION: 1. Moderate left shoulder osteoarthritis.  No acute fracture.     Electronically Signed   By: Sharlet Salina M.D.   On: 01/05/2023 17:15 I, Connor Dixon, personally (independently) visualized and performed the interpretation of the images attached in this note.      Assessment and Plan: 88 y.o. male with left shoulder pain.  This is a chronic problem with recurrence.  Last time he had a subacromial injection which worked okay.  He does have some shoulder arthritis so we will try a glenohumeral injection.  If today's injection is better than the injection in August we can start doing this more.  Check back as needed.   PDMP not reviewed this encounter. Orders Placed This Encounter  Procedures   [place] LIMITED JOINT SPACE STRUCTURES UP LEFT(NO LINKED CHARGES)    Standing Status:   Future    Number of Occurrences:   1    Standing Expiration Date:   04/03/2024    Order Specific Question:   Reason for Exam (SYMPTOM  OR DIAGNOSIS REQUIRED)    Answer:  Left hsoulder pain    Order Specific Question:   Preferred imaging location?    Answer:   [facility]   No orders of the defined types were placed in this encounter.    Discussed warning signs or symptoms. Please see discharge instructions. Patient expresses understanding.   The above documentation has been reviewed and is  accurate and complete Connor Dixon, M.D.

## 2023-04-04 NOTE — Patient Instructions (Signed)
Good to see you  Injection in left shoulder today  Call or go to the ER if you develop a large red swollen joint with extreme pain or oozing puss.   Follow up as needed

## 2023-04-06 ENCOUNTER — Ambulatory Visit: Payer: Medicare PPO

## 2023-04-06 DIAGNOSIS — Z0000 Encounter for general adult medical examination without abnormal findings: Secondary | ICD-10-CM | POA: Diagnosis not present

## 2023-04-06 NOTE — Patient Instructions (Signed)
[name] , Thank you for taking time to come for your Medicare Wellness Visit. I appreciate your ongoing commitment to your health goals. Please review the following plan we discussed and let me know if I can assist you in the future.   Screening recommendations/referrals: Colonoscopy: no longer required Recommended yearly ophthalmology/optometry visit for glaucoma screening and checkup Recommended yearly dental visit for hygiene and checkup  Vaccinations: Influenza vaccine: up to date Pneumococcal vaccine: up to date Tdap vaccine: Education provided Shingles vaccine: up to date    Advanced directives: yes   Preventive Care 65 Years and Older, Male Preventive care refers to lifestyle choices and visits with your health care provider that can promote health and wellness. What does preventive care include? A yearly physical exam. This is also called an annual well check. Dental exams once or twice a year. Routine eye exams. Ask your health care provider how often you should have your eyes checked. Personal lifestyle choices, including: Daily care of your teeth and gums. Regular physical activity. Eating a healthy diet. Avoiding tobacco and drug use. Limiting alcohol use. Practicing safe sex. Taking low doses of aspirin every day. Taking vitamin and mineral supplements as recommended by your health care provider. What happens during an annual well check? The services and screenings done by your health care provider during your annual well check will depend on your age, overall health, lifestyle risk factors, and family history of disease. Counseling  Your health care provider may ask you questions about your: Alcohol use. Tobacco use. Drug use. Emotional well-being. Home and relationship well-being. Sexual activity. Eating habits. History of falls. Memory and ability to understand (cognition). Work and work Astronomer. Screening  You may have the following tests or  measurements: Height, weight, and BMI. Blood pressure. Lipid and cholesterol levels. These may be checked every 5 years, or more frequently if you are over 73 years old. Skin check. Lung cancer screening. You may have this screening every year starting at age 67 if you have a 30-pack-year history of smoking and currently smoke or have quit within the past 15 years. Fecal occult blood test (FOBT) of the stool. You may have this test every year starting at age 28. Flexible sigmoidoscopy or colonoscopy. You may have a sigmoidoscopy every 5 years or a colonoscopy every 10 years starting at age 38. Prostate cancer screening. Recommendations will vary depending on your family history and other risks. Hepatitis C blood test. Hepatitis B blood test. Sexually transmitted disease (STD) testing. Diabetes screening. This is done by checking your blood sugar (glucose) after you have not eaten for a while (fasting). You may have this done every 1-3 years. Abdominal aortic aneurysm (AAA) screening. You may need this if you are a current or former smoker. Osteoporosis. You may be screened starting at age 36 if you are at high risk. Talk with your health care provider about your test results, treatment options, and if necessary, the need for more tests. Vaccines  Your health care provider may recommend certain vaccines, such as: Influenza vaccine. This is recommended every year. Tetanus, diphtheria, and acellular pertussis (Tdap, Td) vaccine. You may need a Td booster every 10 years. Zoster vaccine. You may need this after age 46. Pneumococcal 13-valent conjugate (PCV13) vaccine. One dose is recommended after age 30. Pneumococcal polysaccharide (PPSV23) vaccine. One dose is recommended after age 9. Talk to your health care provider about which screenings and vaccines you need and how often you need them. This information is  not intended to replace advice given to you by your health care provider. Make sure  you discuss any questions you have with your health care provider. Document Released: 05/29/2015 Document Revised: 01/20/2016 Document Reviewed: 03/03/2015 Elsevier Interactive Patient Education  2017 [employer].  Fall Prevention in the Home Falls can cause injuries. They can happen to people of all ages. There are many things you can do to make your home safe and to help prevent falls. What can I do on the outside of my home? Regularly fix the edges of walkways and driveways and fix any cracks. Remove anything that might make you trip as you walk through a door, such as a raised step or threshold. Trim any bushes or trees on the path to your home. Use bright outdoor lighting. Clear any walking paths of anything that might make someone trip, such as rocks or tools. Regularly check to see if handrails are loose or broken. Make sure that both sides of any steps have handrails. Any raised decks and porches should have guardrails on the edges. Have any leaves, snow, or ice cleared regularly. Use sand or salt on walking paths during winter. Clean up any spills in your garage right away. This includes oil or grease spills. What can I do in the bathroom? Use night lights. Install grab bars by the toilet and in the tub and shower. Do not use towel bars as grab bars. Use non-skid mats or decals in the tub or shower. If you need to sit down in the shower, use a plastic, non-slip stool. Keep the floor dry. Clean up any water that spills on the floor as soon as it happens. Remove soap buildup in the tub or shower regularly. Attach bath mats securely with double-sided non-slip rug tape. Do not have throw rugs and other things on the floor that can make you trip. What can I do in the bedroom? Use night lights. Make sure that you have a light by your bed that is easy to reach. Do not use any sheets or blankets that are too big for your bed. They should not hang down onto the floor. Have a firm  chair that has side arms. You can use this for support while you get dressed. Do not have throw rugs and other things on the floor that can make you trip. What can I do in the kitchen? Clean up any spills right away. Avoid walking on wet floors. Keep items that you use a lot in easy-to-reach places. If you need to reach something above you, use a strong step stool that has a grab bar. Keep electrical cords out of the way. Do not use floor polish or wax that makes floors slippery. If you must use wax, use non-skid floor wax. Do not have throw rugs and other things on the floor that can make you trip. What can I do with my stairs? Do not leave any items on the stairs. Make sure that there are handrails on both sides of the stairs and use them. Fix handrails that are broken or loose. Make sure that handrails are as long as the stairways. Check any carpeting to make sure that it is firmly attached to the stairs. Fix any carpet that is loose or worn. Avoid having throw rugs at the top or bottom of the stairs. If you do have throw rugs, attach them to the floor with carpet tape. Make sure that you have a light switch at the top of the  stairs and the bottom of the stairs. If you do not have them, ask someone to add them for you. What else can I do to help prevent falls? Wear shoes that: Do not have high heels. Have rubber bottoms. Are comfortable and fit you well. Are closed at the toe. Do not wear sandals. If you use a stepladder: Make sure that it is fully opened. Do not climb a closed stepladder. Make sure that both sides of the stepladder are locked into place. Ask someone to hold it for you, if possible. Clearly mark and make sure that you can see: Any grab bars or handrails. First and last steps. Where the edge of each step is. Use tools that help you move around (mobility aids) if they are needed. These include: Canes. Walkers. Scooters. Crutches. Turn on the lights when you go  into a dark area. Replace any light bulbs as soon as they burn out. Set up your furniture so you have a clear path. Avoid moving your furniture around. If any of your floors are uneven, fix them. If there are any pets around you, be aware of where they are. Review your medicines with your doctor. Some medicines can make you feel dizzy. This can increase your chance of falling. Ask your doctor what other things that you can do to help prevent falls. This information is not intended to replace advice given to you by your health care provider. Make sure you discuss any questions you have with your health care provider. Document Released: 02/26/2009 Document Revised: 10/08/2015 Document Reviewed: 06/06/2014 Elsevier Interactive Patient Education  2017 [employer].

## 2023-04-06 NOTE — Progress Notes (Signed)
Subjective:   Connor Dixon is a 88 y.o. male who presents for Medicare Annual/Subsequent preventive examination.  Visit Complete: Virtual I connected with  Connor Dixon on 04/06/23 by a audio enabled telemedicine application and verified that I am speaking with the correct person using two identifiers.  Patient Location: Home  Provider Location: Home Office  I discussed the limitations of evaluation and management by telemedicine. The patient expressed understanding and agreed to proceed.  Vital Signs: Because this visit was a virtual/telehealth visit, some criteria may be missing or patient reported. Any vitals not documented were not able to be obtained and vitals that have been documented are patient reported  Cardiac Risk Factors include: advanced age (>56men, >2 women);male gender;obesity (BMI >30kg/m2);family history of premature cardiovascular disease;hypertension     Objective:    There were no vitals filed for this visit. There is no height or weight on file to calculate BMI.     04/06/2023   11:07 AM 01/13/2023    8:40 AM 04/11/2022    2:40 PM 04/05/2021    8:27 AM 01/16/2020   12:14 PM 01/11/2019   10:26 AM 01/09/2018    8:31 AM  Advanced Directives  Does Patient Have a Medical Advance Directive? Yes No Yes Yes Yes Yes Yes  Type of Advance Directive Living will;Healthcare Power of [employer] Power of [place];Living will Healthcare Power of [place];Living will Living will;Healthcare Power of [employer] Power of [place];Living will Healthcare Power of [place];Living will  Does patient want to make changes to medical advance directive?    Yes (ED - Information included in AVS) No - Patient declined    Copy of Healthcare Power of Attorney in Chart? No - copy requested  No - copy requested No - copy requested No - copy requested No - copy requested No - copy requested  Would patient like information on creating a medical advance directive?  Yes  (MAU/Ambulatory/Procedural Areas - Information given)         Current Medications (verified) Outpatient Encounter Medications as of 04/06/2023  Medication Sig   ACCU-CHEK GUIDE test strip TEST FOUR TIMES DAILY   allopurinol (ZYLOPRIM) 100 MG tablet TAKE 1 TABLET(100 MG) BY MOUTH DAILY   amoxicillin (AMOXIL) 500 MG capsule TAKE ONE CAPSULE BY MOUTH THREE TIMES DAILY. TAKE ONLY WHEN HAVING DENTAL PROCEDURES   apixaban (ELIQUIS) 2.5 MG TABS tablet Take 1 tablet (2.5 mg total) by mouth 2 (two) times daily.   BD INSULIN SYRINGE U/F 31G X 5/16" 0.5 ML MISC USE FOUR TIMES DAILY AS DIRECTED   blood glucose meter kit and supplies KIT Use to check blood sugar daily. DX: E11.9   calcitRIOL (ROCALTROL) 0.25 MCG capsule Take 1 capsule (0.25 mcg total) by mouth daily. Return in about 6 months (around 02/04/2023).   carvedilol (COREG) 6.25 MG tablet TAKE 1 TABLET(6.25 MG) BY MOUTH TWICE DAILY WITH A MEAL   Continuous Glucose Receiver (DEXCOM G7 RECEIVER) DEVI 1 Act by Does not apply route daily.   Continuous Glucose Sensor (DEXCOM G7 SENSOR) MISC 1 Act by Does not apply route daily.   furosemide (LASIX) 40 MG tablet Take 2 tablets in the morning and 1 tablet in the afternoon.   Glucagon (GVOKE HYPOPEN 2-PACK) 1 MG/0.2ML SOAJ Inject 1 Act into the skin daily as needed.   insulin isophane & regular human KwikPen (HUMULIN 70/30 KWIKPEN) (70-30) 100 UNIT/ML KwikPen 40 units in AM and 25 units in PM   Insulin Pen Needle (B-D  ULTRAFINE III SHORT PEN) 31G X 8 MM MISC USE TO ADMINISTER INSULIN THREE TIMES DAILY   losartan (COZAAR) 25 MG tablet TAKE 1 TABLET(25 MG) BY MOUTH DAILY   pravastatin (PRAVACHOL) 40 MG tablet TAKE 1 TABLET(40 MG) BY MOUTH DAILY   No facility-administered encounter medications on file as of 04/06/2023.    Allergies (verified) Oysters [shellfish allergy], Farxiga [dapagliflozin], and Apple juice   History: Past Medical History:  Diagnosis Date   Acute on chronic diastolic congestive  heart failure (HCC)    Arthus phenomenon    Atrioventricular block, complete (HCC)    Cardiac pacemaker in situ 07/22/2010   Chronic kidney disease, stage 3 (HCC)    Presumably from longstanding DM & HTN. Scr has gradually increased over the past 2 years (1.6 in 12/2010, 2 in 03/2011, and up to 2.1 in 01/2013)   DIABETES MELLITUS, TYPE I, ADULT ONSET 08/06/2007   DM (diabetes mellitus) (HCC)    adult onset    Essential hypertension, benign 08/06/2007   Very good BP control   History of second degree heart block    HLD (hyperlipidemia)    HYPERLIPIDEMIA 08/06/2007   Hypopotassemia    Morbid obesity (HCC)    Obesity    Other and unspecified angina pectoris    typical   Type I (juvenile type) diabetes mellitus without mention of complication, not stated as uncontrolled    (04/25/13 OV with Dr. Arrie Aran) = Poorly controlled & is working  w/Dr. Arthur Holms to improve this. Most recent Hgb A1c was 8.2% but Connor Dixon reports it was down to 7.6 last month.   Past Surgical History:  Procedure Laterality Date   CATARACT EXTRACTION  8/08   [place]   PPM GENERATOR CHANGEOUT N/A 02/13/2019   Procedure: PPM GENERATOR CHANGEOUT;  Surgeon: Marinus Maw, MD;  Location: [facility] INVASIVE CV LAB;  Service: Cardiovascular;  Laterality: N/A;   PTVDP  12/11   PPM - [employer]   TOTAL HIP ARTHROPLASTY  04/05/06   right   Family History  Problem Relation Age of Onset   Asthma Father    Coronary artery disease Father    Diabetes Father    Hyperlipidemia Father    Stroke Father 54   Coronary artery disease Mother    Asthma Mother    Heart attack Mother 57   Colon cancer Neg Hx        no definate prostate CA    Social History   Socioeconomic History   Marital status: Widowed    Spouse name: Not on file   Number of children: 2   Years of education: Not on file   Highest education level: Not on file  Occupational History   Occupation: retired  Tobacco Use   Smoking status: Never   Smokeless  tobacco: Never  Vaping Use   Vaping status: Never Used  Substance and Sexual Activity   Alcohol use: No   Drug use: No   Sexual activity: Not Currently  Other Topics Concern   Not on file  Social History Narrative   [employer] BA, 2 Master's degrees. [place] - for certification Education specialist.    Retired Animal nutritionist.   Active - travels.   Married - 1961; 2 sons (1962 and 67) and 5 grandchildren.        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    Social Determinants of Health   Financial Resource Strain: Low Risk  (04/06/2023)   Overall Financial Resource Strain (CARDIA)  Difficulty of Paying Living Expenses: Not hard at all  Food Insecurity: No Food Insecurity (04/06/2023)   Hunger Vital Sign    Worried About Running Out of Food in the Last Year: Never true    Ran Out of Food in the Last Year: Never true  Transportation Needs: No Transportation Needs (04/06/2023)   PRAPARE - Administrator, Civil Service (Medical): No    Lack of Transportation (Non-Medical): No  Physical Activity: Inactive (04/06/2023)   Exercise Vital Sign    Days of Exercise per Week: 0 days    Minutes of Exercise per Session: 0 min  Stress: No Stress Concern Present (04/06/2023)   [employer] of Occupational Health - Occupational Stress Questionnaire    Feeling of Stress : Not at all  Social Connections: Moderately Isolated (04/06/2023)   Social Connection and Isolation Panel [NHANES]    Frequency of Communication with Friends and Family: More than three times a week    Frequency of Social Gatherings with Friends and Family: Never    Attends Religious Services: More than 4 times per year    Active Member of [employer] or Organizations: No    Attends Banker Meetings: Never    Marital Status: Widowed    Tobacco Counseling Counseling given: Not Answered   Clinical Intake:  Pre-visit preparation completed: Yes  Pain : No/denies pain      Diabetes: Yes CBG done?: No Did pt. bring in CBG monitor from home?: No  How often do you need to have someone help you when you read instructions, pamphlets, or other written materials from your doctor or pharmacy?: 1 - Never  Interpreter Needed?: No  Information entered by :: Remi Haggard LPN   Activities of Daily Living    04/06/2023   11:08 AM 04/11/2022    2:45 PM  In your present state of health, do you have any difficulty performing the following activities:  Hearing? 1 0  Vision? 0 0  Difficulty concentrating or making decisions? 0 0  Walking or climbing stairs? 1 0  Dressing or bathing? 0 0  Doing errands, shopping? 0 0  Preparing Food and eating ? N N  Using the Toilet? N N  In the past six months, have you accidently leaked urine? N N  Do you have problems with loss of bowel control? N N  Managing your Medications? N N  Managing your Finances? N N  Housekeeping or managing your Housekeeping?  N    Patient Care Team: Etta Grandchild, MD as PCP - General (Internal Medicine) Marinus Maw, MD as PCP - Electrophysiology (Cardiology) Marinus Maw, MD (Cardiology) Salvatore Marvel, MD (Orthopedic Surgery) Mckinley Jewel, MD (Ophthalmology)  Indicate any recent Medical Services you may have received from other than [facility] providers in the past year (date may be approximate).     Assessment:   This is a routine wellness examination for [place].  Hearing/Vision screen Hearing Screening - Comments:: No hearing aids Some trouble hearing Vision Screening - Comments:: Up to date [facility]   Goals Addressed             This Visit's Progress    Patient Stated       Continue current lifestyle       Depression Screen    04/06/2023   11:11 AM 01/05/2023    3:52 PM 01/05/2023    3:51 PM 04/11/2022    2:44 PM 04/05/2021    8:31 AM 04/05/2021  8:24 AM 01/16/2020   12:12 PM  PHQ 2/9 Scores  PHQ - 2 Score 0 0 0 0 0 0 0  PHQ- 9 Score 0 1          Fall Risk    04/06/2023   11:06 AM 01/05/2023    3:51 PM 04/11/2022    2:41 PM 04/05/2021    8:28 AM 01/16/2020   12:14 PM  Fall Risk   Falls in the past year? 0 0 0 0 0  Number falls in past yr: 0 0 0 0 0  Injury with Fall? 0 0 0 0 0  Risk for fall due to : Impaired balance/gait No Fall Risks No Fall Risks  No Fall Risks  Follow up Falls evaluation completed;Education provided;Falls prevention discussed Falls evaluation completed Falls prevention discussed Falls evaluation completed Falls evaluation completed    MEDICARE RISK AT HOME: Medicare Risk at Home Any stairs in or around the home?: Yes If so, are there any without handrails?: No Home free of loose throw rugs in walkways, pet beds, electrical cords, etc?: Yes Adequate lighting in your home to reduce risk of falls?: Yes Life alert?: No Use of a cane, walker or w/c?: Yes Grab bars in the bathroom?: Yes Shower chair or bench in shower?: Yes Elevated toilet seat or a handicapped toilet?: Yes  TIMED UP AND GO:  Was the test performed?  No    Cognitive Function:    01/09/2018    5:04 PM  MMSE - Mini Mental State Exam  Orientation to time 5  Orientation to Place 5  Registration 3  Attention/ Calculation 5  Recall 2  Language- name 2 objects 2  Language- repeat 1  Language- follow 3 step command 3  Language- read & follow direction 1  Write a sentence 1  Copy design 1  Total score 29        04/06/2023   11:14 AM 04/11/2022    2:50 PM 01/16/2020   12:16 PM  6CIT Screen  What Year? 0 points 0 points 0 points  What month? 0 points 0 points 0 points  What time? 0 points 0 points 0 points  Count back from 20 0 points 0 points 0 points  Months in reverse 0 points 0 points 0 points  Repeat phrase 0 points 0 points 0 points  Total Score 0 points 0 points 0 points    Immunizations Immunization History  Administered Date(s) Administered   Fluad Quad(high Dose 65+) 01/11/2019, 02/18/2020, 03/17/2022   H1N1  06/05/2008   Influenza Split 02/22/2011, 02/16/2021   Influenza Whole 02/04/2008, 02/24/2009, 02/05/2010   Influenza, High Dose Seasonal PF 02/20/2015, 01/27/2016, 01/31/2017, 02/07/2018   Influenza,inj,Quad PF,6+ Mos 01/15/2013   Influenza-Unspecified 03/22/2023   Moderna Sars-Covid-2 Vaccination 05/30/2019, 06/27/2019   Pneumococcal Conjugate-13 04/02/2013   Pneumococcal Polysaccharide-23 01/09/2012, 06/09/2020   Td 01/09/2012   Zoster Recombinant(Shingrix) 08/16/2017, 10/17/2017   Zoster, Live 02/28/2014    TDAP status: Due, Education has been provided regarding the importance of this vaccine. Advised may receive this vaccine at local pharmacy or Health Dept. Aware to provide a copy of the vaccination record if obtained from local pharmacy or Health Dept. Verbalized acceptance and understanding.  Flu Vaccine status: Up to date  Pneumococcal vaccine status: Up to date  Covid-19 vaccine status: Information provided on how to obtain vaccines.   Qualifies for Shingles Vaccine? No   Zostavax completed Yes   Shingrix Completed?: Yes  Screening Tests Health Maintenance  Topic  Date Due   COVID-19 Vaccine (3 - 2023-24 season) 04/22/2023 (Originally 01/15/2023)   DTaP/Tdap/Td (2 - Tdap) 04/05/2024 (Originally 01/08/2022)   FOOT EXAM  08/04/2023   OPHTHALMOLOGY EXAM  11/25/2023   Medicare Annual Wellness (AWV)  04/05/2024   Pneumonia Vaccine 31+ Years old  Completed   INFLUENZA VACCINE  Completed   Zoster Vaccines- Shingrix  Completed   HPV VACCINES  Aged Out    Health Maintenance  There are no preventive care reminders to display for this patient.   Colorectal cancer screening: No longer required.   Lung Cancer Screening: (Low Dose CT Chest recommended if Age 33-80 years, 20 pack-year currently smoking OR have quit w/in 15years.) does not qualify.   Lung Cancer Screening Referral:   Additional Screening:  Hepatitis C Screening:  never done  Vision Screening: Recommended  annual ophthalmology exams for early detection of glaucoma and other disorders of the eye. Is the patient up to date with their annual eye exam?  Yes  Who is the provider or what is the name of the office in which the patient attends annual eye exams? [facility] If pt is not established with a provider, would they like to be referred to a provider to establish care? No .   Dental Screening: Recommended annual dental exams for proper oral hygiene Nutrition Risk Assessment:  Has the patient had any N/V/D within the last 2 months?  No  Does the patient have any non-healing wounds?  No  Has the patient had any unintentional weight loss or weight gain?  No   Diabetes:  Is the patient diabetic?  Yes  If diabetic, was a CBG obtained today?  No  Did the patient bring in their glucometer from home?  No  How often do you monitor your CBG's? Continuous glucouse monitor.   Financial Strains and Diabetes Management:  Are you having any financial strains with the device, your supplies or your medication? No .  Does the patient want to be seen by Chronic Care Management for management of their diabetes?  No  Would the patient like to be referred to a Nutritionist or for Diabetic Management?  No   Diabetic Exams:  Diabetic Eye Exam: .Pt has been advised about the importance in completing this exam.     Community Resource Referral / Chronic Care Management: CRR required this visit?  No   CCM required this visit?  No     Plan:     I have personally reviewed and noted the following in the patient's chart:   Medical and social history Use of alcohol, tobacco or illicit drugs  Current medications and supplements including opioid prescriptions. Patient is not currently taking opioid prescriptions. Functional ability and status Nutritional status Physical activity Advanced directives List of other physicians Hospitalizations, surgeries, and ER visits in previous 12  months Vitals Screenings to include cognitive, depression, and falls Referrals and appointments  In addition, I have reviewed and discussed with patient certain preventive protocols, quality metrics, and best practice recommendations. A written personalized care plan for preventive services as well as general preventive health recommendations were provided to patient.     Remi Haggard, LPN   09/60/[id number]   After Visit Summary: (MyChart) Due to this being a telephonic visit, the after visit summary with patients personalized plan was offered to patient via MyChart   Nurse Notes:

## 2023-04-24 DIAGNOSIS — E119 Type 2 diabetes mellitus without complications: Secondary | ICD-10-CM | POA: Diagnosis not present

## 2023-05-08 ENCOUNTER — Ambulatory Visit: Payer: Medicare PPO | Admitting: Internal Medicine

## 2023-05-08 ENCOUNTER — Encounter: Payer: Self-pay | Admitting: Internal Medicine

## 2023-05-08 ENCOUNTER — Ambulatory Visit (INDEPENDENT_AMBULATORY_CARE_PROVIDER_SITE_OTHER): Payer: Medicare PPO

## 2023-05-08 VITALS — BP 114/70 | HR 70 | Temp 98.2°F | Ht 68.0 in | Wt 203.0 lb

## 2023-05-08 DIAGNOSIS — I950 Idiopathic hypotension: Secondary | ICD-10-CM | POA: Diagnosis not present

## 2023-05-08 DIAGNOSIS — N1832 Chronic kidney disease, stage 3b: Secondary | ICD-10-CM

## 2023-05-08 DIAGNOSIS — Z950 Presence of cardiac pacemaker: Secondary | ICD-10-CM | POA: Diagnosis not present

## 2023-05-08 DIAGNOSIS — E785 Hyperlipidemia, unspecified: Secondary | ICD-10-CM | POA: Diagnosis not present

## 2023-05-08 DIAGNOSIS — E0840 Diabetes mellitus due to underlying condition with diabetic neuropathy, unspecified: Secondary | ICD-10-CM | POA: Diagnosis not present

## 2023-05-08 DIAGNOSIS — I10 Essential (primary) hypertension: Secondary | ICD-10-CM | POA: Diagnosis not present

## 2023-05-08 DIAGNOSIS — E1140 Type 2 diabetes mellitus with diabetic neuropathy, unspecified: Secondary | ICD-10-CM | POA: Diagnosis not present

## 2023-05-08 DIAGNOSIS — I959 Hypotension, unspecified: Secondary | ICD-10-CM | POA: Diagnosis not present

## 2023-05-08 LAB — URINALYSIS, ROUTINE W REFLEX MICROSCOPIC
Bilirubin Urine: NEGATIVE
Hgb urine dipstick: NEGATIVE
Ketones, ur: NEGATIVE
Leukocytes,Ua: NEGATIVE
Nitrite: NEGATIVE
RBC / HPF: NONE SEEN (ref 0–?)
Specific Gravity, Urine: 1.015 (ref 1.000–1.030)
Total Protein, Urine: 30 — AB
Urine Glucose: 100 — AB
Urobilinogen, UA: 0.2 (ref 0.0–1.0)
pH: 6 (ref 5.0–8.0)

## 2023-05-08 LAB — BASIC METABOLIC PANEL
BUN: 60 mg/dL — ABNORMAL HIGH (ref 6–23)
CO2: 31 meq/L (ref 19–32)
Calcium: 9 mg/dL (ref 8.4–10.5)
Chloride: 100 meq/L (ref 96–112)
Creatinine, Ser: 2.26 mg/dL — ABNORMAL HIGH (ref 0.40–1.50)
GFR: 25.65 mL/min — ABNORMAL LOW (ref >60.00)
Glucose, Bld: 228 mg/dL — ABNORMAL HIGH (ref 70–99)
Potassium: 3.8 meq/L (ref 3.5–5.1)
Sodium: 137 meq/L (ref 135–145)

## 2023-05-08 LAB — HEPATIC FUNCTION PANEL
ALT: 14 U/L (ref 0–53)
AST: 20 U/L (ref 0–37)
Albumin: 3.8 g/dL (ref 3.5–5.2)
Alkaline Phosphatase: 101 U/L (ref 39–117)
Bilirubin, Direct: 0.2 mg/dL (ref 0.0–0.3)
Total Bilirubin: 1 mg/dL (ref 0.2–1.2)
Total Protein: 7 g/dL (ref 6.0–8.3)

## 2023-05-08 LAB — LIPID PANEL
Cholesterol: 150 mg/dL (ref 0–200)
HDL: 51.9 mg/dL (ref >39.00)
LDL Cholesterol: 88 mg/dL (ref 0–99)
NonHDL: 98.44
Total CHOL/HDL Ratio: 3
Triglycerides: 53 mg/dL (ref 0.0–149.0)
VLDL: 10.6 mg/dL (ref 0.0–40.0)

## 2023-05-08 LAB — HEMOGLOBIN A1C: Hgb A1c MFr Bld: 8.7 % — ABNORMAL HIGH (ref 4.6–6.5)

## 2023-05-08 LAB — CORTISOL: Cortisol, Plasma: 11.9 ug/dL

## 2023-05-08 LAB — URIC ACID: Uric Acid, Serum: 7 mg/dL (ref 4.0–7.8)

## 2023-05-08 MED ORDER — PRAVASTATIN SODIUM 40 MG PO TABS: 40.0000 mg | ORAL_TABLET | Freq: Every day | ORAL | 1 refills | Status: DC

## 2023-05-08 NOTE — Progress Notes (Unsigned)
Subjective:  Patient ID: Connor Dixon, male    DOB: 1935-02-08  Age: 88 y.o. MRN: [id number]  CC: Diabetes   HPI TYRAN CORDES presents for f/up ---  Discussed the use of AI scribe software for clinical note transcription with the patient, who gave verbal consent to proceed.  History of Present Illness   The patient, with a history of diabetes and a cardiac pacemaker, reports overall good health with no chest pain, dizziness, or lightheadedness. However, he has been experiencing episodes of low blood sugar, particularly at night, which he manages with bedside peppermints. These episodes have not necessitated hospitalization or emergency services in the past few years. The patient denies any palpitations or abnormal heart rhythms.       Outpatient Medications Prior to Visit  Medication Sig Dispense Refill   ACCU-CHEK GUIDE test strip TEST FOUR TIMES DAILY 200 strip 5   allopurinol (ZYLOPRIM) 100 MG tablet TAKE 1 TABLET(100 MG) BY MOUTH DAILY 90 tablet 0   amoxicillin (AMOXIL) 500 MG capsule TAKE ONE CAPSULE BY MOUTH THREE TIMES DAILY. TAKE ONLY WHEN HAVING DENTAL PROCEDURES 30 capsule 2   apixaban (ELIQUIS) 2.5 MG TABS tablet Take 1 tablet (2.5 mg total) by mouth 2 (two) times daily. 60 tablet 5   BD INSULIN SYRINGE U/F 31G X 5/16" 0.5 ML MISC USE FOUR TIMES DAILY AS DIRECTED 200 each 3   blood glucose meter kit and supplies KIT Use to check blood sugar daily. DX: E11.9 1 each 0   calcitRIOL (ROCALTROL) 0.25 MCG capsule Take 1 capsule (0.25 mcg total) by mouth daily. Return in about 6 months (around 02/04/2023). 90 capsule 0   carvedilol (COREG) 6.25 MG tablet TAKE 1 TABLET(6.25 MG) BY MOUTH TWICE DAILY WITH A MEAL 180 tablet 0   Continuous Glucose Receiver (DEXCOM G7 RECEIVER) DEVI 1 Act by Does not apply route daily. 9 each 1   Continuous Glucose Sensor (DEXCOM G7 SENSOR) MISC 1 Act by Does not apply route daily. 9 each 1   furosemide (LASIX) 40 MG tablet Take 2 tablets in the morning  and 1 tablet in the afternoon. 270 tablet 3   Glucagon (GVOKE HYPOPEN 2-PACK) 1 MG/0.2ML SOAJ Inject 1 Act into the skin daily as needed. 2 mL 5   insulin isophane & regular human KwikPen (HUMULIN 70/30 KWIKPEN) (70-30) 100 UNIT/ML KwikPen 40 units in AM and 25 units in PM 60 mL 1   Insulin Pen Needle (B-D ULTRAFINE III SHORT PEN) 31G X 8 MM MISC USE TO ADMINISTER INSULIN THREE TIMES DAILY 300 each 2   losartan (COZAAR) 25 MG tablet TAKE 1 TABLET(25 MG) BY MOUTH DAILY 90 tablet 1   pravastatin (PRAVACHOL) 40 MG tablet TAKE 1 TABLET(40 MG) BY MOUTH DAILY 90 tablet 0   No facility-administered medications prior to visit.    ROS Review of Systems  Constitutional: Negative.  Negative for appetite change, diaphoresis, fatigue and unexpected weight change.  HENT: Negative.    Eyes: Negative.   Respiratory: Negative.  Negative for cough, shortness of breath and wheezing.   Cardiovascular:  Negative for chest pain, palpitations and leg swelling.  Gastrointestinal: Negative.  Negative for abdominal pain, constipation, diarrhea, nausea and vomiting.  Genitourinary: Negative.  Negative for difficulty urinating.  Musculoskeletal:  Positive for gait problem. Negative for arthralgias and joint swelling.  Skin: Negative.   Neurological:  Negative for dizziness and weakness.  Hematological:  Negative for adenopathy. Does not bruise/bleed easily.  Psychiatric/Behavioral: Negative.  Objective:  BP 114/70 (BP Location: Right Arm, Patient Position: Sitting, Cuff Size: Normal)   Pulse 70   Temp 98.2 F (36.8 C) (Oral)   Ht 5\' 8"  (1.727 m)   Wt 203 lb (92.1 kg)   SpO2 98%   BMI 30.87 kg/m   BP Readings from Last 3 Encounters:  05/08/23 114/70  04/04/23 134/70  02/21/23 108/62    Wt Readings from Last 3 Encounters:  05/08/23 203 lb (92.1 kg)  04/04/23 210 lb (95.3 kg)  02/21/23 208 lb (94.3 kg)    Physical Exam Vitals reviewed.  Constitutional:      General: He is not in acute  distress.    Appearance: He is not toxic-appearing or diaphoretic.  HENT:     Mouth/Throat:     Mouth: Mucous membranes are moist.  Eyes:     General: No scleral icterus.    Conjunctiva/sclera: Conjunctivae normal.  Cardiovascular:     Rate and Rhythm: Normal rate and regular rhythm.     Heart sounds: Heart sounds are distant. No murmur heard.    Comments: EKG- V-paced rhythm, 69 bpm  Pulmonary:     Effort: Pulmonary effort is normal.     Breath sounds: No stridor. No wheezing, rhonchi or rales.  Abdominal:     General: Abdomen is flat.     Palpations: There is no mass.     Tenderness: There is no abdominal tenderness. There is no guarding.     Hernia: No hernia is present.  Musculoskeletal:        General: Normal range of motion.     Cervical back: Neck supple.     Right lower leg: No edema.     Left lower leg: No edema.  Skin:    General: Skin is warm and dry.  Neurological:     General: No focal deficit present.     Mental Status: He is alert. Mental status is at baseline.  Psychiatric:        Mood and Affect: Mood normal.        Behavior: Behavior normal.     Lab Results  Component Value Date   WBC 6.7 01/05/2023   HGB 13.7 01/05/2023   HCT 40.9 01/05/2023   PLT 196.0 01/05/2023   GLUCOSE 228 (H) 05/08/2023   CHOL 150 05/08/2023   TRIG 53.0 05/08/2023   HDL 51.90 05/08/2023   LDLCALC 88 05/08/2023   ALT 14 05/08/2023   AST 20 05/08/2023   NA 137 05/08/2023   K 3.8 05/08/2023   CL 100 05/08/2023   CREATININE 2.26 (H) 05/08/2023   BUN 60 (H) 05/08/2023   CO2 31 05/08/2023   TSH 2.01 08/04/2022   PSA 1.77 01/06/2010   HGBA1C 8.7 (H) 05/08/2023   MICROALBUR 24.1 (H) 08/04/2022    EP PPM/ICD IMPLANT Result Date: 02/13/2019 Conclusion: Successful dual-chamber pacemaker generator removal and insertion of a new dual-chamber pacemaker in a patient with complete heart block who had reached elective replacement. Lewayne Bunting, MD  DG Chest 2 View Result  Date: 05/08/2023 CLINICAL DATA:  Hypotension. EXAM: CHEST - 2 VIEW COMPARISON:  April 17, 2010. FINDINGS: The heart size and mediastinal contours are within normal limits. Left-sided pacemaker is unchanged in position. Both lungs are clear. The visualized skeletal structures are unremarkable. IMPRESSION: No active cardiopulmonary disease. Electronically Signed   By: Lupita Raider M.D.   On: 05/08/2023 08:50     Assessment & Plan:  Hyperlipidemia with target LDL less than  70 -     Lipid panel; Future -     Hepatic function panel; Future -     Pravastatin Sodium; Take 1 tablet (40 mg total) by mouth daily. TAKE 1 TABLET(40 MG) BY MOUTH DAILY  Dispense: 90 tablet; Refill: 1  Stage 3b chronic kidney disease (HCC) -     Basic metabolic panel; Future -     Urinalysis, Routine w reflex microscopic; Future -     Uric acid; Future  Essential hypertension -     Basic metabolic panel; Future -     Urinalysis, Routine w reflex microscopic; Future -     Hepatic function panel; Future -     EKG 12-Lead  Diabetes mellitus due to underlying condition with diabetic neuropathy, without long-term current use of insulin (HCC) -     Basic metabolic panel; Future -     Urinalysis, Routine w reflex microscopic; Future -     Hemoglobin A1c; Future -     Hepatic function panel; Future  Idiopathic hypotension -     Cortisol; Future -     DG Chest 2 View; Future     Follow-up: Return in about 4 months (around 09/06/2023).  Sanda Linger, MD

## 2023-05-08 NOTE — Patient Instructions (Signed)
Type 1 Diabetes Mellitus, Diagnosis, Adult  Type 1 diabetes mellitus, or type 1 diabetes, is a long-term (chronic) disease. It happens when the cells in the pancreas that make a hormone called insulin are destroyed. Normally, insulin lets blood sugar (glucose) enter cells in your body. This gives you energy. If you have type 1 diabetes, glucose cannot get into cells. It builds up in the blood instead. This causes high blood glucose (hyperglycemia). There is no cure for type 1 diabetes. Treatment can help you manage your condition. What are the causes? The exact cause of type 1 diabetes is not known. What increases the risk? You may be more likely to have type 1 diabetes if a family member has it as well. You may also be more at risk if: You have a gene for type 1 diabetes that was passed down to you from a parent (inherited). You have an autoimmune disorder. This means that your body's disease-fighting system (immune system) attacks your body. You have been exposed to certain viruses. You live in an area with cold weather. What are the signs or symptoms? Symptoms may begin slowly over days or weeks. They may also start all of a sudden. Symptoms may include: Increased thirst or hunger. Needing to pee (urinate) more often or peeing more at night. Sudden weight changes that you cannot explain. Tiredness (fatigue) or weakness. Vision changes. These may include blurry vision. How is this diagnosed? Type 1 diabetes is diagnosed based on your symptoms, your medical history, a physical exam, and your blood glucose level. Your blood glucose may be checked with: A fasting blood glucose (FBG) test. You will not be allowed to eat (you will fast) for 8 hours or more before a blood sample is taken. A random blood glucose test. This test checks your blood glucose at any time of day no matter when you last ate. A hemoglobin A1C (A1C) blood test. This shows what your blood glucose levels have been over the  last 2-3 months. You may be diagnosed with type 1 diabetes if: Your FBG level is 126 mg/dL (7 mmol/L) or higher. Your random blood glucose level is 200 mg/dL (16.1 mmol/L) or higher. Your A1C level is 6.5% or higher. These blood tests may be done more than once. You may also need other blood tests. How is this treated? You may work with an expert called an endocrinologist to find ways to manage your diabetes. You should also follow instructions from your health care provider. You may need to: Take insulin every day. This helps to keep your blood glucose levels in the right range. Take medicines to help prevent problems from diabetes. You may need to take: Aspirin. Medicine to lower your cholesterol. Medicine to control your blood pressure. Check your blood glucose as often as told. Make diet and lifestyle changes. You may be told to: Follow a nutrition plan that has been made just for you by a dietitian. Get regular exercise. Find ways to manage stress. Your provider will set treatment goals that are right for you. These goals will be based on your age, other conditions you have, and how you respond to treatment. In general, your A1C level should be less than 7% and your blood glucose levels should be: 80-130 mg/dL (0.9-6.0 mmol/L) before meals (preprandial). Below 180 mg/dL (10 mmol/L) after meals (postprandial). Follow these instructions at home: Questions to ask your health care provider You may want to ask your provider: Do I need to meet with a certified  diabetes care and education specialist? Should I join a support group for people with diabetes? What equipment will I need to manage my diabetes at home? What diabetes medicines should I take, and when? How often should I check my blood glucose? What number should I call if I have questions? When is my next appointment? General instructions Take over-the-counter and prescription medicines only as told by your provider. Keep all  follow-up visits. You will need regular blood tests to make sure the treatments are working. Your provider may adjust your medicines based on your test results. Where to find more information American Diabetes Association (ADA): diabetes.org Association of Diabetes Care and Education Specialists (ADCES): diabeteseducator.org International Diabetes Federation (IDF): http://hill.biz/ Contact a health care provider if: Your blood glucose level is higher than 240 mg/dL (16.1 mmol/L) for 2 days in a row. You have been sick or have had a fever for 2 days or more, and you are not getting better. For more than 6 hours, you: Cannot eat or drink. Have nausea and vomiting. Have diarrhea. Get help right away if: Your blood glucose is less than 54 mg/dL (3 mmol/L). You become confused, or you have trouble thinking clearly. You have trouble breathing. These symptoms may be an emergency. Get help right away. Call 911. Do not wait to see if the symptoms will go away. Do not drive yourself to the hospital. This information is not intended to replace advice given to you by your health care provider. Make sure you discuss any questions you have with your health care provider. Document Revised: 01/03/2022 Document Reviewed: 01/03/2022 Elsevier Patient Education  2024 [employer].

## 2023-05-09 ENCOUNTER — Other Ambulatory Visit: Payer: Self-pay | Admitting: Internal Medicine

## 2023-05-09 NOTE — Addendum Note (Signed)
Addended by: Etta Grandchild on: 05/09/2023 07:59 AM   Modules accepted: Level of Service

## 2023-05-11 ENCOUNTER — Telehealth: Payer: Self-pay

## 2023-05-11 NOTE — Progress Notes (Signed)
Care Guide Pharmacy Note  05/11/2023 Name: Connor Dixon MRN: [id number] DOB: [birth year]  Referred By: Etta Grandchild, MD Reason for referral: Care Coordination (Outreach to schedule with Pharm d )   Connor Dixon is a 88 y.o. year old male who is a primary care patient of Etta Grandchild, MD.  [name] was referred to the pharmacist for assistance related to: HTN and DMII  Successful contact was made with the patient to discuss pharmacy services including being ready for the pharmacist to call at least 5 minutes before the scheduled appointment time and to have medication bottles and any blood pressure readings ready for review. The patient agreed to meet with the pharmacist via telephone visit on (date/time).05/26/2023  Penne Lash , RMA     [place]  [facility], [facility] Guide  Direct Dial: [phone]  Website: [email or web address]

## 2023-05-19 LAB — CUP PACEART REMOTE DEVICE CHECK
Battery Remaining Longevity: 46 mo
Battery Remaining Percentage: 42 %
Battery Voltage: 2.96 V
Brady Statistic AP VP Percent: 1.9 %
Brady Statistic AP VS Percent: 1 %
Brady Statistic AS VP Percent: 97 %
Brady Statistic AS VS Percent: 1 %
Brady Statistic RA Percent Paced: 1.4 %
Brady Statistic RV Percent Paced: 99 %
Date Time Interrogation Session: 20250103040034
Implantable Lead Connection Status: 753985
Implantable Lead Connection Status: 753985
Implantable Lead Implant Date: 20111202
Implantable Lead Implant Date: 20111202
Implantable Lead Location: 753859
Implantable Lead Location: 753860
Implantable Pulse Generator Implant Date: 20200930
Lead Channel Impedance Value: 380 Ohm
Lead Channel Impedance Value: 450 Ohm
Lead Channel Pacing Threshold Amplitude: 0.75 V
Lead Channel Pacing Threshold Amplitude: 1.75 V
Lead Channel Pacing Threshold Pulse Width: 0.4 ms
Lead Channel Pacing Threshold Pulse Width: 0.8 ms
Lead Channel Sensing Intrinsic Amplitude: 1.5 mV
Lead Channel Sensing Intrinsic Amplitude: 5.6 mV
Lead Channel Setting Pacing Amplitude: 2 V
Lead Channel Setting Pacing Amplitude: 2 V
Lead Channel Setting Pacing Pulse Width: 0.8 ms
Lead Channel Setting Sensing Sensitivity: 4 mV
Pulse Gen Model: 2272
Pulse Gen Serial Number: 9156495

## 2023-05-23 ENCOUNTER — Ambulatory Visit (INDEPENDENT_AMBULATORY_CARE_PROVIDER_SITE_OTHER): Payer: Medicare PPO

## 2023-05-23 ENCOUNTER — Other Ambulatory Visit: Payer: Self-pay | Admitting: Internal Medicine

## 2023-05-23 DIAGNOSIS — I442 Atrioventricular block, complete: Secondary | ICD-10-CM | POA: Diagnosis not present

## 2023-05-23 DIAGNOSIS — I10 Essential (primary) hypertension: Secondary | ICD-10-CM

## 2023-05-26 ENCOUNTER — Other Ambulatory Visit (INDEPENDENT_AMBULATORY_CARE_PROVIDER_SITE_OTHER): Payer: Medicare PPO | Admitting: Pharmacist

## 2023-05-26 DIAGNOSIS — E1121 Type 2 diabetes mellitus with diabetic nephropathy: Secondary | ICD-10-CM

## 2023-05-26 NOTE — Progress Notes (Signed)
   05/30/2023 Name: Connor Dixon MRN: [id number] DOB: May 29, 1934  Chief Complaint  Patient presents with   Diabetes   Hypertension   Medication Management    Connor Dixon is a 89 y.o. year old male who presented for a telephone visit.   They were referred to the pharmacist by their PCP for assistance in managing diabetes and hypertension.    Has lost weight 40/25, taking 2-3 units less Weight loss 80 lbs in the last 4 years Using Dexcom G7 - getting lows over night about once per week  Subjective:  Care Team: Primary Care Provider: [name], MD   Medication Access/Adherence  Current Pharmacy:  [facility] DR[facility] STORE [phone] - [place], [place] - [address] AT [facility] OF [address] [name]  [address] Phone: [phone] Fax: [phone]    Diabetes:  Current medications: Humulin  70/30 BID - pt notes he has reduced his insulin  dose by 203 units because he has lost weight. Would not confirm specific number of units currently taking.  Current glucose readings: notes he has a Dexcom. No specific readings provided.   Patient denies hypoglycemic s/sx including dizziness, shakiness, sweating.   **Patient mentioned he has a son that is a international aid/development worker who checks on him regularly and helps manage his blood glucose.    Hypertension:  Current medications: carvedilol  6.25 mg BID    Objective: BP Readings from Last 3 Encounters:  05/08/23 114/70  04/04/23 134/70  02/21/23 108/62     Lab Results  Component Value Date   HGBA1C 8.7 (H) 05/08/2023    Lab Results  Component Value Date   CREATININE 2.26 (H) 05/08/2023   BUN 60 (H) 05/08/2023   NA 137 05/08/2023   K 3.8 05/08/2023   CL 100 05/08/2023   CO2 31 05/08/2023    Lab Results  Component Value Date   CHOL 150 05/08/2023   HDL 51.90 05/08/2023   LDLCALC 88 05/08/2023   TRIG 53.0 05/08/2023   CHOLHDL 3 05/08/2023    Medications Reviewed Today    Medications were not reviewed in this encounter       Assessment/Plan:   Diabetes: *Patient was not very forth coming with information surround blood glucose readings or diet therefore his current regimen was difficult to assess. He eventually expressed he did not wish to continue the conversation and was not open to discussing ways to help with his diabetes management.  - Currently uncontrolled, A1c goal <8% without hypoglycemia. Noted pt was hypoglycemic on fasting labs. - Reviewed long term cardiovascular and renal outcomes of uncontrolled blood sugar - Reviewed dietary modifications including increasing protein, making sure to eat a bed time snack.  Hypertension: - Currently controlled, Bp goal <130/80   Follow Up Plan: PRN  Darrelyn Drum, PharmD, BCPS, CPP Clinical Pharmacist Practitioner [facility] [phone]

## 2023-05-30 DIAGNOSIS — E113393 Type 2 diabetes mellitus with moderate nonproliferative diabetic retinopathy without macular edema, bilateral: Secondary | ICD-10-CM | POA: Diagnosis not present

## 2023-05-30 NOTE — Patient Instructions (Signed)
  Feel free to call with any questions or concerns!  Arbutus Leas, PharmD, BCPS, CPP Clinical Pharmacist Practitioner [facility] at [facility] [phone]

## 2023-06-29 ENCOUNTER — Other Ambulatory Visit: Payer: Self-pay | Admitting: Internal Medicine

## 2023-06-29 DIAGNOSIS — M10071 Idiopathic gout, right ankle and foot: Secondary | ICD-10-CM

## 2023-06-29 DIAGNOSIS — I10 Essential (primary) hypertension: Secondary | ICD-10-CM

## 2023-06-29 DIAGNOSIS — E0840 Diabetes mellitus due to underlying condition with diabetic neuropathy, unspecified: Secondary | ICD-10-CM

## 2023-07-03 ENCOUNTER — Other Ambulatory Visit: Payer: Self-pay | Admitting: Internal Medicine

## 2023-07-03 DIAGNOSIS — M10071 Idiopathic gout, right ankle and foot: Secondary | ICD-10-CM

## 2023-07-03 DIAGNOSIS — M1A3790 Chronic gout due to renal impairment, unspecified ankle and foot, without tophus (tophi): Secondary | ICD-10-CM

## 2023-07-03 DIAGNOSIS — I10 Essential (primary) hypertension: Secondary | ICD-10-CM

## 2023-07-03 MED ORDER — CARVEDILOL 6.25 MG PO TABS: ORAL_TABLET | ORAL | 0 refills | Status: DC

## 2023-07-03 MED ORDER — ALLOPURINOL 100 MG PO TABS: ORAL_TABLET | ORAL | 0 refills | Status: DC

## 2023-07-03 NOTE — Telephone Encounter (Signed)
 Last Fill: 12/28/22  Last OV: 05/08/23 Next OV: None Scheduled  Routing to provider for review/authorization.

## 2023-07-03 NOTE — Telephone Encounter (Signed)
 Copied from CRM [id number]. Topic: Clinical - Medication Refill >> Jul 03, 2023  3:58 PM Isabell A wrote: Most Recent Primary Care Visit:  Provider: Candy Sledge R  Department: [facility]  Visit Type: PATIENT OUTREACH 60  Date: 05/26/2023  Medication: allopurinol (ZYLOPRIM) 100 MG tablet  Has the patient contacted their pharmacy? Yes (Agent: If no, request that the patient contact the pharmacy for the refill. If patient does not wish to contact the pharmacy document the reason why and proceed with request.) (Agent: If yes, when and what did the pharmacy advise?)  Is this the correct pharmacy for this prescription? Yes If no, delete pharmacy and type the correct one.  This is the patient's preferred pharmacy:  [facility] DR[facility] STORE #[id number] - [place], [place] - [address] AT [facility] OF [address] [place] [address] Phone: [phone] Fax: [phone]  Has the prescription been filled recently? Yes  Is the patient out of the medication? Yes  Has the patient been seen for an appointment in the last year OR does the patient have an upcoming appointment? Yes  Can we respond through MyChart? No  Agent: Please be advised that Rx refills may take up to 3 business days. We ask that you follow-up with your pharmacy.

## 2023-07-04 NOTE — Progress Notes (Signed)
 Remote pacemaker transmission.

## 2023-07-23 DIAGNOSIS — E119 Type 2 diabetes mellitus without complications: Secondary | ICD-10-CM | POA: Diagnosis not present

## 2023-08-14 ENCOUNTER — Other Ambulatory Visit: Payer: Self-pay | Admitting: Internal Medicine

## 2023-08-14 DIAGNOSIS — E559 Vitamin D deficiency, unspecified: Secondary | ICD-10-CM

## 2023-08-14 MED ORDER — APIXABAN 2.5 MG PO TABS: 2.5000 mg | ORAL_TABLET | Freq: Two times a day (BID) | ORAL | 5 refills | Status: AC

## 2023-08-14 NOTE — Telephone Encounter (Signed)
 Copied from CRM [id number]. Topic: Clinical - Medication Refill >> Aug 14, 2023 12:34 PM Saverio Danker wrote: Most Recent Primary Care Visit:  Provider: Candy Sledge R  Department: [facility]  Visit Type: PATIENT OUTREACH 60  Date: 05/26/2023  Medication: apixaban (ELIQUIS) 2.5 MG TABS tablet, calcitRIOL (ROCALTROL) 0.25 MCG capsule  Has the patient contacted their pharmacy? Yes (Agent: If no, request that the patient contact the pharmacy for the refill. If patient does not wish to contact the pharmacy document the reason why and proceed with request.) (Agent: If yes, when and what did the pharmacy advise?)  Is this the correct pharmacy for this prescription? Yes If no, delete pharmacy and type the correct one.  This is the patient's preferred pharmacy:  [facility] DR[facility] STORE #[id number] - [place], [place] - [address] AT [facility] OF [address] [place] [address] Phone: [phone] Fax: [phone]  [facility] - [place], [place] - [address] [place] [place] [address] Phone: [phone] Fax: [phone]  [facility] DRUG STORE #[id number] [facility], [place] - [address] AT [facility] [address] [place] [address] Phone: [phone] Fax: [phone]  [facility] DRUG STORE #[id number] [place], [place] - [address] AT [facility] OF [facility] & [address] [place] [address] Phone: [phone] Fax: [phone]   Has the prescription been filled recently? No  Is the patient out of the medication? Yes  Has the patient been seen for an appointment in the last year OR does the patient have an upcoming appointment? Yes  Can we respond through MyChart? Yes  Agent: Please be advised that Rx refills may take up to 3 business days. We ask that you follow-up with your pharmacy.

## 2023-08-23 ENCOUNTER — Ambulatory Visit (INDEPENDENT_AMBULATORY_CARE_PROVIDER_SITE_OTHER): Payer: Medicare PPO

## 2023-08-23 DIAGNOSIS — I442 Atrioventricular block, complete: Secondary | ICD-10-CM

## 2023-08-23 LAB — CUP PACEART REMOTE DEVICE CHECK
Battery Remaining Longevity: 36 mo
Battery Remaining Percentage: 39 %
Battery Voltage: 2.96 V
Brady Statistic AP VP Percent: 1.8 %
Brady Statistic AP VS Percent: 1 %
Brady Statistic AS VP Percent: 97 %
Brady Statistic AS VS Percent: 1 %
Brady Statistic RA Percent Paced: 1.3 %
Brady Statistic RV Percent Paced: 99 %
Date Time Interrogation Session: 20250409020015
Implantable Lead Connection Status: 753985
Implantable Lead Connection Status: 753985
Implantable Lead Implant Date: 20111202
Implantable Lead Implant Date: 20111202
Implantable Lead Location: 753859
Implantable Lead Location: 753860
Implantable Pulse Generator Implant Date: 20200930
Lead Channel Impedance Value: 380 Ohm
Lead Channel Impedance Value: 460 Ohm
Lead Channel Pacing Threshold Amplitude: 0.75 V
Lead Channel Pacing Threshold Amplitude: 1.875 V
Lead Channel Pacing Threshold Pulse Width: 0.4 ms
Lead Channel Pacing Threshold Pulse Width: 0.8 ms
Lead Channel Sensing Intrinsic Amplitude: 1.9 mV
Lead Channel Sensing Intrinsic Amplitude: 7.9 mV
Lead Channel Setting Pacing Amplitude: 2 V
Lead Channel Setting Pacing Amplitude: 2.125
Lead Channel Setting Pacing Pulse Width: 0.8 ms
Lead Channel Setting Sensing Sensitivity: 4 mV
Pulse Gen Model: 2272
Pulse Gen Serial Number: 9156495

## 2023-08-29 ENCOUNTER — Encounter: Payer: Self-pay | Admitting: Internal Medicine

## 2023-09-04 DIAGNOSIS — N1831 Chronic kidney disease, stage 3a: Secondary | ICD-10-CM | POA: Diagnosis not present

## 2023-09-06 ENCOUNTER — Ambulatory Visit: Admitting: Internal Medicine

## 2023-09-06 ENCOUNTER — Encounter: Payer: Self-pay | Admitting: Internal Medicine

## 2023-09-06 VITALS — BP 122/86 | HR 83 | Temp 97.5°F | Ht 68.0 in | Wt 199.2 lb

## 2023-09-06 DIAGNOSIS — N184 Chronic kidney disease, stage 4 (severe): Secondary | ICD-10-CM

## 2023-09-06 DIAGNOSIS — E1122 Type 2 diabetes mellitus with diabetic chronic kidney disease: Secondary | ICD-10-CM

## 2023-09-06 DIAGNOSIS — Z794 Long term (current) use of insulin: Secondary | ICD-10-CM | POA: Diagnosis not present

## 2023-09-06 DIAGNOSIS — J301 Allergic rhinitis due to pollen: Secondary | ICD-10-CM | POA: Diagnosis not present

## 2023-09-06 DIAGNOSIS — I10 Essential (primary) hypertension: Secondary | ICD-10-CM | POA: Diagnosis not present

## 2023-09-06 DIAGNOSIS — E0840 Diabetes mellitus due to underlying condition with diabetic neuropathy, unspecified: Secondary | ICD-10-CM

## 2023-09-06 LAB — HEMOGLOBIN A1C: Hgb A1c MFr Bld: 8.7 % — ABNORMAL HIGH (ref 4.6–6.5)

## 2023-09-06 MED ORDER — LEVOCETIRIZINE DIHYDROCHLORIDE 5 MG PO TABS: 5.0000 mg | ORAL_TABLET | Freq: Every evening | ORAL | 1 refills | Status: DC

## 2023-09-06 NOTE — Patient Instructions (Signed)
Type 1 Diabetes Mellitus, Diagnosis, Adult  Type 1 diabetes mellitus, or type 1 diabetes, is a long-term (chronic) disease. It happens when the cells in the pancreas that make a hormone called insulin are destroyed. Normally, insulin lets blood sugar (glucose) enter cells in your body. This gives you energy. If you have type 1 diabetes, glucose cannot get into cells. It builds up in the blood instead. This causes high blood glucose (hyperglycemia). There is no cure for type 1 diabetes. Treatment can help you manage your condition. What are the causes? The exact cause of type 1 diabetes is not known. What increases the risk? You may be more likely to have type 1 diabetes if a family member has it as well. You may also be more at risk if: You have a gene for type 1 diabetes that was passed down to you from a parent (inherited). You have an autoimmune disorder. This means that your body's disease-fighting system (immune system) attacks your body. You have been exposed to certain viruses. You live in an area with cold weather. What are the signs or symptoms? Symptoms may begin slowly over days or weeks. They may also start all of a sudden. Symptoms may include: Increased thirst or hunger. Needing to pee (urinate) more often or peeing more at night. Sudden weight changes that you cannot explain. Tiredness (fatigue) or weakness. Vision changes. These may include blurry vision. How is this diagnosed? Type 1 diabetes is diagnosed based on your symptoms, your medical history, a physical exam, and your blood glucose level. Your blood glucose may be checked with: A fasting blood glucose (FBG) test. You will not be allowed to eat (you will fast) for 8 hours or more before a blood sample is taken. A random blood glucose test. This test checks your blood glucose at any time of day no matter when you last ate. A hemoglobin A1C (A1C) blood test. This shows what your blood glucose levels have been over the  last 2-3 months. You may be diagnosed with type 1 diabetes if: Your FBG level is 126 mg/dL (7 mmol/L) or higher. Your random blood glucose level is 200 mg/dL (16.1 mmol/L) or higher. Your A1C level is 6.5% or higher. These blood tests may be done more than once. You may also need other blood tests. How is this treated? You may work with an expert called an endocrinologist to find ways to manage your diabetes. You should also follow instructions from your health care provider. You may need to: Take insulin every day. This helps to keep your blood glucose levels in the right range. Take medicines to help prevent problems from diabetes. You may need to take: Aspirin. Medicine to lower your cholesterol. Medicine to control your blood pressure. Check your blood glucose as often as told. Make diet and lifestyle changes. You may be told to: Follow a nutrition plan that has been made just for you by a dietitian. Get regular exercise. Find ways to manage stress. Your provider will set treatment goals that are right for you. These goals will be based on your age, other conditions you have, and how you respond to treatment. In general, your A1C level should be less than 7% and your blood glucose levels should be: 80-130 mg/dL (0.9-6.0 mmol/L) before meals (preprandial). Below 180 mg/dL (10 mmol/L) after meals (postprandial). Follow these instructions at home: Questions to ask your health care provider You may want to ask your provider: Do I need to meet with a certified  diabetes care and education specialist? Should I join a support group for people with diabetes? What equipment will I need to manage my diabetes at home? What diabetes medicines should I take, and when? How often should I check my blood glucose? What number should I call if I have questions? When is my next appointment? General instructions Take over-the-counter and prescription medicines only as told by your provider. Keep all  follow-up visits. You will need regular blood tests to make sure the treatments are working. Your provider may adjust your medicines based on your test results. Where to find more information American Diabetes Association (ADA): diabetes.org Association of Diabetes Care and Education Specialists (ADCES): diabeteseducator.org International Diabetes Federation (IDF): http://hill.biz/ Contact a health care provider if: Your blood glucose level is higher than 240 mg/dL (16.1 mmol/L) for 2 days in a row. You have been sick or have had a fever for 2 days or more, and you are not getting better. For more than 6 hours, you: Cannot eat or drink. Have nausea and vomiting. Have diarrhea. Get help right away if: Your blood glucose is less than 54 mg/dL (3 mmol/L). You become confused, or you have trouble thinking clearly. You have trouble breathing. These symptoms may be an emergency. Get help right away. Call 911. Do not wait to see if the symptoms will go away. Do not drive yourself to the hospital. This information is not intended to replace advice given to you by your health care provider. Make sure you discuss any questions you have with your health care provider. Document Revised: 01/03/2022 Document Reviewed: 01/03/2022 Elsevier Patient Education  2024 [employer].

## 2023-09-06 NOTE — Progress Notes (Unsigned)
 Subjective:  Patient ID: Connor Dixon, male    DOB: [birth year]  Age: 89 y.o. MRN: [id number]  CC: Diabetes   HPI Connor Dixon presents for f/up ---  Discussed the use of AI scribe software for clinical note transcription with the patient, who gave verbal consent to proceed.  History of Present Illness   Connor Dixon is an 89 year old male who presents with concerns about a change in his vitamin D  supplement regimen.  He has recently experienced a change in his vitamin D  supplement regimen, shifting from every other day to daily intake. He is uncertain about who authorized this change, suspecting it occurred during his primary physician's absence. He is particularly concerned about this adjustment due to a previous episode of bradycardia, which led to further testing that revealed no significant issues.  His blood sugar levels remain stable, but he is adjusting to a weight loss of four pounds since December. No symptoms of high or low blood sugar, chest pain, abdominal pain, nausea, vomiting, or diarrhea.  He experiences allergy symptoms with clear nasal phlegm.       Outpatient Medications Prior to Visit  Medication Sig Dispense Refill   allopurinol  (ZYLOPRIM ) 100 MG tablet TAKE 1 TABLET(100 MG) BY MOUTH DAILY 90 tablet 0   amoxicillin  (AMOXIL ) 500 MG capsule TAKE ONE CAPSULE BY MOUTH THREE TIMES DAILY. TAKE ONLY WHEN HAVING DENTAL PROCEDURES 30 capsule 2   apixaban  (ELIQUIS ) 2.5 MG TABS tablet Take 1 tablet (2.5 mg total) by mouth 2 (two) times daily. 60 tablet 5   calcitRIOL  (ROCALTROL ) 0.25 MCG capsule TAKE 1 CAPSULE BY MOUTH DAILY RETURN IN ABOUT 6 MONTHS(AROUND 02/04/2023) 90 capsule 0   carvedilol  (COREG ) 6.25 MG tablet TAKE 1 TABLET(6.25 MG) BY MOUTH TWICE DAILY WITH A MEAL 180 tablet 0   Continuous Glucose Receiver (DEXCOM G7 RECEIVER) DEVI 1 Act by Does not apply route daily. 9 each 1   Continuous Glucose Sensor (DEXCOM G7 SENSOR) MISC 1 Act by Does not apply route daily.  9 each 1   furosemide  (LASIX ) 40 MG tablet Take 2 tablets in the morning and 1 tablet in the afternoon. 270 tablet 3   Glucagon  (GVOKE HYPOPEN  2-PACK) 1 MG/0.2ML SOAJ Inject 1 Act into the skin daily as needed. 2 mL 5   insulin  isophane & regular human KwikPen (HUMULIN  70/30 KWIKPEN) (70-30) 100 UNIT/ML KwikPen 40 units in AM and 25 units in PM 60 mL 1   loratadine (CLARITIN) 10 MG tablet Take 10 mg by mouth daily as needed for allergies.     losartan  (COZAAR ) 25 MG tablet Take 25 mg by mouth daily.     pravastatin  (PRAVACHOL ) 40 MG tablet Take 1 tablet (40 mg total) by mouth daily. TAKE 1 TABLET(40 MG) BY MOUTH DAILY 90 tablet 1   ACCU-CHEK GUIDE test strip TEST FOUR TIMES DAILY 200 strip 5   BD INSULIN  SYRINGE U/F 31G X 5/16" 0.5 ML MISC USE FOUR TIMES DAILY AS DIRECTED 200 each 3   blood glucose meter kit and supplies KIT Use to check blood sugar daily. DX: E11.9 1 each 0   Insulin  Pen Needle (B-D ULTRAFINE III SHORT PEN) 31G X 8 MM MISC USE TO ADMINISTER INSULIN  THREE TIMES DAILY 300 each 2   No facility-administered medications prior to visit.    ROS Review of Systems  HENT:  Positive for congestion, postnasal drip, rhinorrhea and sneezing. Negative for ear discharge, nosebleeds, sinus pressure, sore throat, tinnitus and voice  change.   Eyes: Negative.   Respiratory:  Negative for cough, chest tightness, shortness of breath and wheezing.   Cardiovascular:  Negative for chest pain, palpitations and leg swelling.  Gastrointestinal: Negative.  Negative for abdominal pain, constipation, diarrhea, nausea and vomiting.  Endocrine: Negative.   Genitourinary: Negative.  Negative for difficulty urinating.  Musculoskeletal:  Positive for gait problem. Negative for myalgias and neck pain.  Skin: Negative.   Neurological:  Negative for dizziness and weakness.  Hematological:  Negative for adenopathy. Does not bruise/bleed easily.  Psychiatric/Behavioral:  Positive for confusion and decreased  concentration.     Objective:  BP 122/86 (BP Location: Left Arm, Patient Position: Sitting, Cuff Size: Normal)   Pulse 83   Temp (!) 97.5 F (36.4 C) (Oral)   Ht 5\' 8"  (1.727 m)   Wt 199 lb 3.2 oz (90.4 kg)   SpO2 99%   BMI 30.29 kg/m   BP Readings from Last 3 Encounters:  09/06/23 122/86  05/08/23 114/70  04/04/23 134/70    Wt Readings from Last 3 Encounters:  09/06/23 199 lb 3.2 oz (90.4 kg)  05/08/23 203 lb (92.1 kg)  04/04/23 210 lb (95.3 kg)    Physical Exam Vitals reviewed.  Constitutional:      Appearance: Normal appearance.  HENT:     Nose: Nose normal.     Mouth/Throat:     Mouth: Mucous membranes are moist.  Eyes:     General: No scleral icterus.    Conjunctiva/sclera: Conjunctivae normal.  Cardiovascular:     Rate and Rhythm: Normal rate and regular rhythm.     Heart sounds: No murmur heard.    No friction rub. No gallop.  Pulmonary:     Effort: Pulmonary effort is normal.     Breath sounds: No stridor. No wheezing, rhonchi or rales.  Abdominal:     General: Abdomen is flat.     Palpations: There is no mass.     Tenderness: There is no abdominal tenderness. There is no guarding.     Hernia: No hernia is present.  Musculoskeletal:        General: Normal range of motion.     Cervical back: Neck supple.     Right lower leg: No edema.     Left lower leg: No edema.  Lymphadenopathy:     Cervical: No cervical adenopathy.  Skin:    General: Skin is warm and dry.  Neurological:     General: No focal deficit present.     Mental Status: He is alert.  Psychiatric:        Mood and Affect: Mood normal.        Behavior: Behavior normal.     Lab Results  Component Value Date   WBC 6.7 01/05/2023   HGB 13.7 01/05/2023   HCT 40.9 01/05/2023   PLT 196.0 01/05/2023   GLUCOSE 228 (H) 05/08/2023   CHOL 150 05/08/2023   TRIG 53.0 05/08/2023   HDL 51.90 05/08/2023   LDLCALC 88 05/08/2023   ALT 14 05/08/2023   AST 20 05/08/2023   NA 137 05/08/2023    K 3.8 05/08/2023   CL 100 05/08/2023   CREATININE 2.26 (H) 05/08/2023   BUN 60 (H) 05/08/2023   CO2 31 05/08/2023   TSH 2.01 08/04/2022   PSA 1.77 01/06/2010   HGBA1C 8.7 (H) 09/06/2023   MICROALBUR 24.1 (H) 08/04/2022    EP PPM/ICD IMPLANT Result Date: 02/13/2019 Conclusion: Successful dual-chamber pacemaker generator removal and insertion of  a new dual-chamber pacemaker in a patient with complete heart block who had reached elective replacement. Manya Sells, MD   Assessment & Plan:  Essential hypertension  CKD stage 4 due to type 2 diabetes mellitus (HCC)  Diabetes mellitus due to underlying condition with diabetic neuropathy, without long-term current use of insulin  (HCC) -     Hemoglobin A1c; Future -     HM Diabetes Foot Exam  Seasonal allergic rhinitis due to pollen -     Levocetirizine Dihydrochloride ; Take 1 tablet (5 mg total) by mouth every evening.  Dispense: 90 tablet; Refill: 1     Follow-up: Return in about 6 months (around 03/07/2024).  Sandra Crouch, MD

## 2023-09-11 ENCOUNTER — Telehealth: Payer: Self-pay | Admitting: Internal Medicine

## 2023-09-11 NOTE — Telephone Encounter (Signed)
 [facility] called and spoke to Devra Fontana, Pensions consultant about the refill(s) aphixaban requested. Advised it was sent on 08/14/23 #60/5 refill(s). She says it's showing it was sent to the [facility] location, but they can pull it over to this location. Advised I will call the patient to let him know and determine where he wants to pick up the medication. Patient called and he says the [facility] location is closed and he would like to pick it up at [facility] location. I called [facility] and spoke to [employer], Pensions consultant advising of the above. She initially said it will be ready on Wednesday, but I advised he's been out for 6 days and needs it today. She says it will be ready after 2pm today.   Copied from CRM [id number]. Topic: Clinical - Prescription Issue >> Sep 11, 2023  8:57 AM Freya Jesus wrote: Reason for CRM: Patient stated that he came in to see his provider last week and was supposed to receive a blood thinner at his pharmacy last week and as of yet they do not have it. Patient said he's been without for about 6 days and does not know the name of the medication.

## 2023-09-13 ENCOUNTER — Other Ambulatory Visit: Payer: Self-pay | Admitting: Internal Medicine

## 2023-09-13 DIAGNOSIS — E119 Type 2 diabetes mellitus without complications: Secondary | ICD-10-CM

## 2023-09-15 ENCOUNTER — Encounter: Payer: Self-pay | Admitting: Internal Medicine

## 2023-09-15 ENCOUNTER — Ambulatory Visit: Payer: Self-pay

## 2023-09-15 NOTE — Telephone Encounter (Signed)
 This RN made first attempt to speak to patient. No answer, unable to LVM due to VMB not activated. Routing for additional attempts.   Copied from CRM [id number]. Topic: Clinical - Prescription Issue >> Sep 15, 2023  9:13 AM Vivian Z wrote: Reason for CRM: Patient calling regarding his insulin  prescription. He stated that he went to the pharmacy to pick it up but was told that they are waiting in a response from the doctor's office. Patient also stated that he went over all his medications with his doctor at the last appointment ans he would like all of them to be sent to the same [facility] pharmacy.  [facility]ORE #[id number] - [place], [place] - [address] AT [facility] OF [address] & [address] [place] [address] Phone: [phone] Fax: [phone] This encounter was created in error - please disregard.

## 2023-09-15 NOTE — Telephone Encounter (Signed)
 This RN made first attempt to speak to patient. No answer, unable to LVM due to VMB not activated. Routing for additional attempts.   Copied from CRM [id number]. Topic: Clinical - Prescription Issue >> Sep 15, 2023  9:13 AM Vivian Z wrote: Reason for CRM: Patient calling regarding his insulin  prescription. He stated that he went to the pharmacy to pick it up but was told that they are waiting in a response from the doctor's office. Patient also stated that he went over all his medications with his doctor at the last appointment ans he would like all of them to be sent to the same [facility] pharmacy.  [facility] DR[facility] STORE #[id number] [place], [place] - [address] AT [facility] OF [address] [place] [address] Phone: [phone] Fax: [phone]

## 2023-09-18 ENCOUNTER — Other Ambulatory Visit: Payer: Self-pay

## 2023-09-18 ENCOUNTER — Telehealth: Payer: Self-pay

## 2023-09-18 DIAGNOSIS — E119 Type 2 diabetes mellitus without complications: Secondary | ICD-10-CM

## 2023-09-18 MED ORDER — HUMULIN 70/30 KWIKPEN (70-30) 100 UNIT/ML ~~LOC~~ SUPN: PEN_INJECTOR | SUBCUTANEOUS | 1 refills | Status: DC

## 2023-09-18 NOTE — Telephone Encounter (Signed)
 Medication refill has been sent to Dr. Yetta Barre

## 2023-09-18 NOTE — Telephone Encounter (Signed)
 Patient was identified as falling into the [place] Measure - Diabetes.   Patient was: Appointment already scheduled for:  04/23. Has had recent A1C check.

## 2023-09-22 ENCOUNTER — Other Ambulatory Visit: Payer: Self-pay | Admitting: Internal Medicine

## 2023-09-22 DIAGNOSIS — M1A3790 Chronic gout due to renal impairment, unspecified ankle and foot, without tophus (tophi): Secondary | ICD-10-CM

## 2023-09-22 DIAGNOSIS — N1831 Chronic kidney disease, stage 3a: Secondary | ICD-10-CM | POA: Diagnosis not present

## 2023-09-22 DIAGNOSIS — M10071 Idiopathic gout, right ankle and foot: Secondary | ICD-10-CM

## 2023-09-22 DIAGNOSIS — I129 Hypertensive chronic kidney disease with stage 1 through stage 4 chronic kidney disease, or unspecified chronic kidney disease: Secondary | ICD-10-CM | POA: Diagnosis not present

## 2023-09-22 DIAGNOSIS — E1165 Type 2 diabetes mellitus with hyperglycemia: Secondary | ICD-10-CM | POA: Diagnosis not present

## 2023-09-22 DIAGNOSIS — E1122 Type 2 diabetes mellitus with diabetic chronic kidney disease: Secondary | ICD-10-CM | POA: Diagnosis not present

## 2023-09-22 DIAGNOSIS — E785 Hyperlipidemia, unspecified: Secondary | ICD-10-CM | POA: Diagnosis not present

## 2023-09-22 DIAGNOSIS — N2581 Secondary hyperparathyroidism of renal origin: Secondary | ICD-10-CM | POA: Diagnosis not present

## 2023-09-22 DIAGNOSIS — I5033 Acute on chronic diastolic (congestive) heart failure: Secondary | ICD-10-CM | POA: Diagnosis not present

## 2023-10-06 NOTE — Progress Notes (Signed)
 Remote pacemaker transmission.

## 2023-10-09 ENCOUNTER — Other Ambulatory Visit: Payer: Self-pay | Admitting: Internal Medicine

## 2023-10-10 ENCOUNTER — Inpatient Hospital Stay (HOSPITAL_COMMUNITY)
Admission: EM | Admit: 2023-10-10 | Discharge: 2023-11-08 | DRG: 056 | Disposition: A | Discharge status: Skilled Nursing Facility | Attending: Family Medicine | Admitting: Family Medicine

## 2023-10-10 ENCOUNTER — Emergency Department (HOSPITAL_COMMUNITY)

## 2023-10-10 ENCOUNTER — Other Ambulatory Visit: Payer: Self-pay

## 2023-10-10 ENCOUNTER — Encounter (HOSPITAL_COMMUNITY): Payer: Self-pay | Admitting: Emergency Medicine

## 2023-10-10 DIAGNOSIS — Z8249 Family history of ischemic heart disease and other diseases of the circulatory system: Secondary | ICD-10-CM | POA: Diagnosis not present

## 2023-10-10 DIAGNOSIS — R4182 Altered mental status, unspecified: Secondary | ICD-10-CM | POA: Diagnosis not present

## 2023-10-10 DIAGNOSIS — R1312 Dysphagia, oropharyngeal phase: Secondary | ICD-10-CM | POA: Diagnosis not present

## 2023-10-10 DIAGNOSIS — E11649 Type 2 diabetes mellitus with hypoglycemia without coma: Secondary | ICD-10-CM | POA: Diagnosis not present

## 2023-10-10 DIAGNOSIS — Z950 Presence of cardiac pacemaker: Secondary | ICD-10-CM

## 2023-10-10 DIAGNOSIS — R2689 Other abnormalities of gait and mobility: Secondary | ICD-10-CM | POA: Diagnosis not present

## 2023-10-10 DIAGNOSIS — R059 Cough, unspecified: Secondary | ICD-10-CM | POA: Diagnosis not present

## 2023-10-10 DIAGNOSIS — E785 Hyperlipidemia, unspecified: Secondary | ICD-10-CM | POA: Diagnosis present

## 2023-10-10 DIAGNOSIS — R404 Transient alteration of awareness: Secondary | ICD-10-CM | POA: Diagnosis not present

## 2023-10-10 DIAGNOSIS — I5032 Chronic diastolic (congestive) heart failure: Secondary | ICD-10-CM | POA: Diagnosis present

## 2023-10-10 DIAGNOSIS — E872 Acidosis, unspecified: Secondary | ICD-10-CM | POA: Diagnosis present

## 2023-10-10 DIAGNOSIS — I495 Sick sinus syndrome: Secondary | ICD-10-CM | POA: Diagnosis not present

## 2023-10-10 DIAGNOSIS — Z825 Family history of asthma and other chronic lower respiratory diseases: Secondary | ICD-10-CM

## 2023-10-10 DIAGNOSIS — R9089 Other abnormal findings on diagnostic imaging of central nervous system: Secondary | ICD-10-CM | POA: Diagnosis not present

## 2023-10-10 DIAGNOSIS — R9082 White matter disease, unspecified: Secondary | ICD-10-CM | POA: Diagnosis not present

## 2023-10-10 DIAGNOSIS — G9340 Encephalopathy, unspecified: Secondary | ICD-10-CM | POA: Diagnosis not present

## 2023-10-10 DIAGNOSIS — I442 Atrioventricular block, complete: Secondary | ICD-10-CM | POA: Diagnosis not present

## 2023-10-10 DIAGNOSIS — I130 Hypertensive heart and chronic kidney disease with heart failure and stage 1 through stage 4 chronic kidney disease, or unspecified chronic kidney disease: Secondary | ICD-10-CM | POA: Diagnosis present

## 2023-10-10 DIAGNOSIS — I10 Essential (primary) hypertension: Secondary | ICD-10-CM | POA: Diagnosis present

## 2023-10-10 DIAGNOSIS — R41841 Cognitive communication deficit: Secondary | ICD-10-CM | POA: Diagnosis not present

## 2023-10-10 DIAGNOSIS — I480 Paroxysmal atrial fibrillation: Secondary | ICD-10-CM | POA: Diagnosis present

## 2023-10-10 DIAGNOSIS — E1122 Type 2 diabetes mellitus with diabetic chronic kidney disease: Secondary | ICD-10-CM | POA: Diagnosis present

## 2023-10-10 DIAGNOSIS — I6389 Other cerebral infarction: Secondary | ICD-10-CM | POA: Diagnosis not present

## 2023-10-10 DIAGNOSIS — Z66 Do not resuscitate: Secondary | ICD-10-CM | POA: Diagnosis not present

## 2023-10-10 DIAGNOSIS — Z7901 Long term (current) use of anticoagulants: Secondary | ICD-10-CM

## 2023-10-10 DIAGNOSIS — G3184 Mild cognitive impairment, so stated: Principal | ICD-10-CM | POA: Diagnosis present

## 2023-10-10 DIAGNOSIS — R0989 Other specified symptoms and signs involving the circulatory and respiratory systems: Secondary | ICD-10-CM | POA: Diagnosis not present

## 2023-10-10 DIAGNOSIS — Z83438 Family history of other disorder of lipoprotein metabolism and other lipidemia: Secondary | ICD-10-CM

## 2023-10-10 DIAGNOSIS — N179 Acute kidney failure, unspecified: Secondary | ICD-10-CM | POA: Diagnosis not present

## 2023-10-10 DIAGNOSIS — R21 Rash and other nonspecific skin eruption: Secondary | ICD-10-CM | POA: Diagnosis present

## 2023-10-10 DIAGNOSIS — R0689 Other abnormalities of breathing: Secondary | ICD-10-CM | POA: Diagnosis not present

## 2023-10-10 DIAGNOSIS — Z833 Family history of diabetes mellitus: Secondary | ICD-10-CM

## 2023-10-10 DIAGNOSIS — N3289 Other specified disorders of bladder: Secondary | ICD-10-CM | POA: Diagnosis not present

## 2023-10-10 DIAGNOSIS — G839 Paralytic syndrome, unspecified: Secondary | ICD-10-CM | POA: Diagnosis present

## 2023-10-10 DIAGNOSIS — Z741 Need for assistance with personal care: Secondary | ICD-10-CM | POA: Diagnosis not present

## 2023-10-10 DIAGNOSIS — M6281 Muscle weakness (generalized): Secondary | ICD-10-CM | POA: Diagnosis not present

## 2023-10-10 DIAGNOSIS — Z6831 Body mass index (BMI) 31.0-31.9, adult: Secondary | ICD-10-CM | POA: Diagnosis not present

## 2023-10-10 DIAGNOSIS — R9431 Abnormal electrocardiogram [ECG] [EKG]: Secondary | ICD-10-CM | POA: Diagnosis present

## 2023-10-10 DIAGNOSIS — Z0389 Encounter for observation for other suspected diseases and conditions ruled out: Secondary | ICD-10-CM | POA: Diagnosis not present

## 2023-10-10 DIAGNOSIS — G039 Meningitis, unspecified: Secondary | ICD-10-CM | POA: Diagnosis not present

## 2023-10-10 DIAGNOSIS — Z515 Encounter for palliative care: Secondary | ICD-10-CM | POA: Diagnosis not present

## 2023-10-10 DIAGNOSIS — T380X5A Adverse effect of glucocorticoids and synthetic analogues, initial encounter: Secondary | ICD-10-CM | POA: Diagnosis not present

## 2023-10-10 DIAGNOSIS — R9389 Abnormal findings on diagnostic imaging of other specified body structures: Secondary | ICD-10-CM | POA: Diagnosis not present

## 2023-10-10 DIAGNOSIS — R509 Fever, unspecified: Secondary | ICD-10-CM | POA: Diagnosis not present

## 2023-10-10 DIAGNOSIS — R4701 Aphasia: Secondary | ICD-10-CM | POA: Diagnosis present

## 2023-10-10 DIAGNOSIS — G9341 Metabolic encephalopathy: Secondary | ICD-10-CM | POA: Diagnosis present

## 2023-10-10 DIAGNOSIS — Z794 Long term (current) use of insulin: Secondary | ICD-10-CM | POA: Diagnosis not present

## 2023-10-10 DIAGNOSIS — R29810 Facial weakness: Secondary | ICD-10-CM | POA: Diagnosis present

## 2023-10-10 DIAGNOSIS — M6259 Muscle wasting and atrophy, not elsewhere classified, multiple sites: Secondary | ICD-10-CM | POA: Diagnosis not present

## 2023-10-10 DIAGNOSIS — C801 Malignant (primary) neoplasm, unspecified: Secondary | ICD-10-CM | POA: Diagnosis not present

## 2023-10-10 DIAGNOSIS — I6523 Occlusion and stenosis of bilateral carotid arteries: Secondary | ICD-10-CM | POA: Diagnosis not present

## 2023-10-10 DIAGNOSIS — I639 Cerebral infarction, unspecified: Secondary | ICD-10-CM | POA: Diagnosis not present

## 2023-10-10 DIAGNOSIS — R7989 Other specified abnormal findings of blood chemistry: Secondary | ICD-10-CM | POA: Diagnosis not present

## 2023-10-10 DIAGNOSIS — Z7401 Bed confinement status: Secondary | ICD-10-CM | POA: Diagnosis not present

## 2023-10-10 DIAGNOSIS — N281 Cyst of kidney, acquired: Secondary | ICD-10-CM | POA: Diagnosis not present

## 2023-10-10 DIAGNOSIS — R309 Painful micturition, unspecified: Secondary | ICD-10-CM | POA: Diagnosis not present

## 2023-10-10 DIAGNOSIS — Z789 Other specified health status: Secondary | ICD-10-CM | POA: Diagnosis not present

## 2023-10-10 DIAGNOSIS — N1832 Chronic kidney disease, stage 3b: Secondary | ICD-10-CM | POA: Diagnosis present

## 2023-10-10 DIAGNOSIS — Z91013 Allergy to seafood: Secondary | ICD-10-CM

## 2023-10-10 DIAGNOSIS — E871 Hypo-osmolality and hyponatremia: Secondary | ICD-10-CM | POA: Diagnosis present

## 2023-10-10 DIAGNOSIS — R531 Weakness: Secondary | ICD-10-CM | POA: Diagnosis not present

## 2023-10-10 DIAGNOSIS — R29818 Other symptoms and signs involving the nervous system: Secondary | ICD-10-CM | POA: Diagnosis not present

## 2023-10-10 DIAGNOSIS — Z96641 Presence of right artificial hip joint: Secondary | ICD-10-CM | POA: Diagnosis present

## 2023-10-10 DIAGNOSIS — E1159 Type 2 diabetes mellitus with other circulatory complications: Secondary | ICD-10-CM | POA: Diagnosis present

## 2023-10-10 DIAGNOSIS — R55 Syncope and collapse: Secondary | ICD-10-CM | POA: Diagnosis not present

## 2023-10-10 DIAGNOSIS — Z7189 Other specified counseling: Secondary | ICD-10-CM | POA: Diagnosis not present

## 2023-10-10 DIAGNOSIS — E119 Type 2 diabetes mellitus without complications: Secondary | ICD-10-CM

## 2023-10-10 DIAGNOSIS — R569 Unspecified convulsions: Secondary | ICD-10-CM | POA: Diagnosis not present

## 2023-10-10 DIAGNOSIS — G3183 Dementia with Lewy bodies: Secondary | ICD-10-CM | POA: Diagnosis not present

## 2023-10-10 DIAGNOSIS — Z8673 Personal history of transient ischemic attack (TIA), and cerebral infarction without residual deficits: Secondary | ICD-10-CM

## 2023-10-10 DIAGNOSIS — K5710 Diverticulosis of small intestine without perforation or abscess without bleeding: Secondary | ICD-10-CM | POA: Diagnosis not present

## 2023-10-10 DIAGNOSIS — I672 Cerebral atherosclerosis: Secondary | ICD-10-CM | POA: Diagnosis not present

## 2023-10-10 DIAGNOSIS — F0280 Dementia in other diseases classified elsewhere without behavioral disturbance: Secondary | ICD-10-CM | POA: Diagnosis not present

## 2023-10-10 DIAGNOSIS — Z823 Family history of stroke: Secondary | ICD-10-CM

## 2023-10-10 DIAGNOSIS — R293 Abnormal posture: Secondary | ICD-10-CM | POA: Diagnosis not present

## 2023-10-10 DIAGNOSIS — R339 Retention of urine, unspecified: Secondary | ICD-10-CM | POA: Diagnosis not present

## 2023-10-10 LAB — I-STAT VENOUS BLOOD GAS, ED
Acid-Base Excess: 2 mmol/L (ref 0.0–2.0)
Bicarbonate: 23.6 mmol/L (ref 20.0–28.0)
Calcium, Ion: 0.96 mmol/L — ABNORMAL LOW (ref 1.15–1.40)
HCT: 49 % (ref 39.0–52.0)
Hemoglobin: 16.7 g/dL (ref 13.0–17.0)
O2 Saturation: 80 %
Potassium: 4.4 mmol/L (ref 3.5–5.1)
Sodium: 135 mmol/L (ref 135–145)
TCO2: 24 mmol/L (ref 22–32)
pCO2, Ven: 29.1 mmHg — ABNORMAL LOW (ref 44–60)
pH, Ven: 7.518 — ABNORMAL HIGH (ref 7.25–7.43)
pO2, Ven: 39 mmHg (ref 32–45)

## 2023-10-10 LAB — CBC
HCT: 46 % (ref 39.0–52.0)
Hemoglobin: 15.2 g/dL (ref 13.0–17.0)
MCH: 30.3 pg (ref 26.0–34.0)
MCHC: 33 g/dL (ref 30.0–36.0)
MCV: 91.6 fL (ref 80.0–100.0)
Platelets: 300 10*3/uL (ref 150–400)
RBC: 5.02 MIL/uL (ref 4.22–5.81)
RDW: 14.6 % (ref 11.5–15.5)
WBC: 10.7 10*3/uL — ABNORMAL HIGH (ref 4.0–10.5)
nRBC: 0 % (ref 0.0–0.2)

## 2023-10-10 LAB — COMPREHENSIVE METABOLIC PANEL WITH GFR
ALT: 14 U/L (ref 0–44)
AST: 34 U/L (ref 15–41)
Albumin: 3.1 g/dL — ABNORMAL LOW (ref 3.5–5.0)
Alkaline Phosphatase: 67 U/L (ref 38–126)
Anion gap: 14 (ref 5–15)
BUN: 41 mg/dL — ABNORMAL HIGH (ref 8–23)
CO2: 23 mmol/L (ref 22–32)
Calcium: 9.1 mg/dL (ref 8.9–10.3)
Chloride: 98 mmol/L (ref 98–111)
Creatinine, Ser: 2.48 mg/dL — ABNORMAL HIGH (ref 0.61–1.24)
GFR, Estimated: 25 mL/min — ABNORMAL LOW (ref >60)
Glucose, Bld: 222 mg/dL — ABNORMAL HIGH (ref 70–99)
Potassium: 4 mmol/L (ref 3.5–5.1)
Sodium: 135 mmol/L (ref 135–145)
Total Bilirubin: 1.8 mg/dL — ABNORMAL HIGH (ref 0.0–1.2)
Total Protein: 6.8 g/dL (ref 6.5–8.1)

## 2023-10-10 LAB — TSH: TSH: 1.002 u[IU]/mL (ref 0.350–4.500)

## 2023-10-10 LAB — URINALYSIS, W/ REFLEX TO CULTURE (INFECTION SUSPECTED)
Bilirubin Urine: NEGATIVE
Glucose, UA: 50 mg/dL — AB
Ketones, ur: NEGATIVE mg/dL
Leukocytes,Ua: NEGATIVE
Nitrite: NEGATIVE
Protein, ur: >=300 mg/dL — AB
Specific Gravity, Urine: 1.011 (ref 1.005–1.030)
pH: 5 (ref 5.0–8.0)

## 2023-10-10 LAB — I-STAT CHEM 8, ED
BUN: 40 mg/dL — ABNORMAL HIGH (ref 8–23)
Calcium, Ion: 1.14 mmol/L — ABNORMAL LOW (ref 1.15–1.40)
Chloride: 101 mmol/L (ref 98–111)
Creatinine, Ser: 2.4 mg/dL — ABNORMAL HIGH (ref 0.61–1.24)
Glucose, Bld: 217 mg/dL — ABNORMAL HIGH (ref 70–99)
HCT: 47 % (ref 39.0–52.0)
Hemoglobin: 16 g/dL (ref 13.0–17.0)
Potassium: 4.1 mmol/L (ref 3.5–5.1)
Sodium: 137 mmol/L (ref 135–145)
TCO2: 24 mmol/L (ref 22–32)

## 2023-10-10 LAB — DIFFERENTIAL
Abs Immature Granulocytes: 0.07 10*3/uL (ref 0.00–0.07)
Basophils Absolute: 0 10*3/uL (ref 0.0–0.1)
Basophils Relative: 0 %
Eosinophils Absolute: 0 10*3/uL (ref 0.0–0.5)
Eosinophils Relative: 0 %
Immature Granulocytes: 1 %
Lymphocytes Relative: 10 %
Lymphs Abs: 1 10*3/uL (ref 0.7–4.0)
Monocytes Absolute: 1.3 10*3/uL — ABNORMAL HIGH (ref 0.1–1.0)
Monocytes Relative: 13 %
Neutro Abs: 8.2 10*3/uL — ABNORMAL HIGH (ref 1.7–7.7)
Neutrophils Relative %: 76 %

## 2023-10-10 LAB — CREATININE, URINE, RANDOM: Creatinine, Urine: 76 mg/dL

## 2023-10-10 LAB — RAPID URINE DRUG SCREEN, HOSP PERFORMED
Amphetamines: NOT DETECTED
Barbiturates: NOT DETECTED
Benzodiazepines: NOT DETECTED
Cocaine: NOT DETECTED
Opiates: NOT DETECTED
Tetrahydrocannabinol: NOT DETECTED

## 2023-10-10 LAB — CBG MONITORING, ED: Glucose-Capillary: 172 mg/dL — ABNORMAL HIGH (ref 70–99)

## 2023-10-10 LAB — OSMOLALITY, URINE: Osmolality, Ur: 415 mosm/kg (ref 300–900)

## 2023-10-10 LAB — PROTIME-INR
INR: 1.2 (ref 0.8–1.2)
Prothrombin Time: 15.7 s — ABNORMAL HIGH (ref 11.4–15.2)

## 2023-10-10 LAB — TROPONIN I (HIGH SENSITIVITY)
Troponin I (High Sensitivity): 147 ng/L (ref <18)
Troponin I (High Sensitivity): 132 ng/L (ref <18)

## 2023-10-10 LAB — I-STAT CG4 LACTIC ACID, ED: Lactic Acid, Venous: 2.9 mmol/L (ref 0.5–1.9)

## 2023-10-10 LAB — CK: Total CK: 102 U/L (ref 49–397)

## 2023-10-10 LAB — PROCALCITONIN: Procalcitonin: 0.11 ng/mL

## 2023-10-10 LAB — APTT: aPTT: 37 s — ABNORMAL HIGH (ref 24–36)

## 2023-10-10 LAB — PHOSPHORUS: Phosphorus: 4 mg/dL (ref 2.5–4.6)

## 2023-10-10 LAB — OSMOLALITY: Osmolality: 318 mosm/kg — ABNORMAL HIGH (ref 275–295)

## 2023-10-10 LAB — ETHANOL: Alcohol, Ethyl (B): <15 mg/dL (ref <15)

## 2023-10-10 LAB — MAGNESIUM: Magnesium: 2.2 mg/dL (ref 1.7–2.4)

## 2023-10-10 MED ORDER — LACTATED RINGERS IV BOLUS (SEPSIS)
1000.0000 mL | Freq: Once | INTRAVENOUS | Status: AC
  Administered 2023-10-10: 1000 mL via INTRAVENOUS

## 2023-10-10 MED ORDER — ACETAMINOPHEN 325 MG PO TABS
650.0000 mg | ORAL_TABLET | ORAL | Status: DC | PRN
  Administered 2023-10-13 – 2023-11-06 (×6): 650 mg via ORAL
  Filled 2023-10-10 (×6): qty 2

## 2023-10-10 MED ORDER — SODIUM CHLORIDE 0.9 % IV SOLN
INTRAVENOUS | Status: DC
Start: 2023-10-10 — End: 2023-10-13

## 2023-10-10 MED ORDER — INSULIN ASPART 100 UNIT/ML IJ SOLN
0.0000 [IU] | INTRAMUSCULAR | Status: DC
  Administered 2023-10-11: 5 [IU] via SUBCUTANEOUS
  Administered 2023-10-11: 2 [IU] via SUBCUTANEOUS
  Administered 2023-10-11 (×4): 3 [IU] via SUBCUTANEOUS
  Administered 2023-10-12: 5 [IU] via SUBCUTANEOUS
  Administered 2023-10-12: 3 [IU] via SUBCUTANEOUS
  Administered 2023-10-12 (×2): 7 [IU] via SUBCUTANEOUS
  Administered 2023-10-12: 5 [IU] via SUBCUTANEOUS

## 2023-10-10 MED ORDER — IOHEXOL 350 MG/ML SOLN
60.0000 mL | Freq: Once | INTRAVENOUS | Status: AC | PRN
  Administered 2023-10-10: 60 mL via INTRAVENOUS

## 2023-10-10 MED ORDER — STROKE: EARLY STAGES OF RECOVERY BOOK: Freq: Once | Status: DC

## 2023-10-10 MED ORDER — ACETAMINOPHEN 650 MG RE SUPP: 650.0000 mg | RECTAL | Status: DC | PRN

## 2023-10-10 MED ORDER — ACETAMINOPHEN 160 MG/5ML PO SOLN: 650.0000 mg | ORAL | Status: DC | PRN

## 2023-10-10 MED ORDER — LACTATED RINGERS IV SOLN: INTRAVENOUS | Status: DC

## 2023-10-10 NOTE — Assessment & Plan Note (Signed)
 Troponin stable, could be in the setting of brain injury  Echo in am If abnormal would get cardiology consult Monitor on tele

## 2023-10-10 NOTE — Consult Note (Signed)
 NAME:  Connor Dixon, MRN:  [id number], DOB:  12-23-1934, LOS: 0 ADMISSION DATE:  10/10/2023, CONSULTATION DATE:  08/10/2023 REFERRING MD:  Iva Mariner, MD, CHIEF COMPLAINT:  AMS   History of Present Illness:  89 y/o male with multiple medical issues as listed below including A fib on Eliquis , S/p PPM, HTN, CKD stage III, DM on insulin  who presented around 5pm today to ED after being found by care taker(Goddaughter) slumped over a chair and soiled himself.  LKW time was 1700 yesterday when he last spoke to his son who lives in [place] .  Initial NIHSS 18 and he was a stroke alert.  CT head with no acute process. CTA head and neck with no LVO . Patient not a candidate for Thrombolytics.  Pertinent  Medical History   Acute on chronic diastolic congestive heart failure (HCC), Arthus phenomenon, Atrioventricular block, complete (HCC), Cardiac pacemaker in situ (07/22/2010), Chronic kidney disease, stage 3 (HCC), DIABETES MELLITUS, TYPE I, ADULT ONSET (08/06/2007), DM (diabetes mellitus) (HCC), Essential hypertension, benign (08/06/2007), History of second degree heart block, HLD (hyperlipidemia), HYPERLIPIDEMIA (08/06/2007), Hypopotassemia, Morbid obesity (HCC), Obesity, Other and unspecified angina pectoris, and Type I (juvenile type) diabetes mellitus without mention of complication, not stated as uncontrolled.   Significant Hospital Events: Including procedures, antibiotic start and stop dates in addition to other pertinent events   5/27: evaluated in ED, not accepted to ICU  Interim History / Subjective:  N/a  Objective    Blood pressure (!) 137/96, pulse 89, temperature 99.6 F (37.6 C), resp. rate 12, weight 91 kg, SpO2 99%.        Intake/Output Summary (Last 24 hours) at 10/10/2023 2325 Last data filed at 10/10/2023 1946 Gross per 24 hour  Intake 1000 ml  Output --  Net 1000 ml   Filed Weights   10/10/23 1705  Weight: 91 kg    Examination: General: awake but not following any commands  and not verbal HENT: pulpis reactive no icterus, right gaze preference Lungs: CTA no wheezes no rales Cardiovascular: reg paced no murmurs no gallops Abdomen: soft nt nd bs pos Extremities: no cyanosis, clubbing or edema, b/l upper extremities raised up almost like contractures Neuro: awake but not following any commands and not verbal, withdraws to pain   Resolved problem list   Assessment and Plan  AMS Possibly form acute CVA vs seizure Continuous EEG being set up Neurology has seen patient and made recommendations Possible acute stroke  Follow NIHSS score Follow mental status/neurochecks Aspiration precautions Seizure precautions  Suggest Palliative Care consult and goals of care At this time he does not meet ICU admission criteria  Please re-consult as needed D/W ER Physician and Hospitalist Best Practice (right click and "Reselect all SmartList Selections" daily)   Per Floor protocol  Labs   CBC: Recent Labs  Lab 10/10/23 1710 10/10/23 1803 10/10/23 2101  WBC 10.7*  --   --   NEUTROABS 8.2*  --   --   HGB 15.2 16.0 16.7  HCT 46.0 47.0 49.0  MCV 91.6  --   --   PLT 300  --   --     Basic Metabolic Panel: Recent Labs  Lab 10/10/23 1710 10/10/23 1803 10/10/23 2101  NA 135 137 135  K 4.0 4.1 4.4  CL 98 101  --   CO2 23  --   --   GLUCOSE 222* 217*  --   BUN 41* 40*  --   CREATININE 2.48* 2.40*  --  CALCIUM 9.1  --   --   MG 2.2  --   --   PHOS 4.0  --   --    GFR: Estimated Creatinine Clearance: 24.2 mL/min (A) (by C-G formula based on SCr of 2.4 mg/dL (H)). Recent Labs  Lab 10/10/23 1710 10/10/23 2102  PROCALCITON 0.11  --   WBC 10.7*  --   LATICACIDVEN  --  2.9*    Liver Function Tests: Recent Labs  Lab 10/10/23 1710  AST 34  ALT 14  ALKPHOS 67  BILITOT 1.8*  PROT 6.8  ALBUMIN 3.1*   No results for input(s): "LIPASE", "AMYLASE" in the last 168 hours. No results for input(s): "AMMONIA" in the last 168 hours.  ABG     Component Value Date/Time   HCO3 23.6 10/10/2023 2101   TCO2 24 10/10/2023 2101   O2SAT 80 10/10/2023 2101     Coagulation Profile: Recent Labs  Lab 10/10/23 1710  INR 1.2    Cardiac Enzymes: Recent Labs  Lab 10/10/23 1710  CKTOTAL 102    HbA1C: Hgb A1c MFr Bld  Date/Time Value Ref Range Status  09/06/2023 03:08 PM 8.7 (H) 4.6 - 6.5 % Final    Comment:    Glycemic Control Guidelines for People with Diabetes:Non Diabetic:  <6%Goal of Therapy: <7%Additional Action Suggested:  >8%   05/08/2023 08:33 AM 8.7 (H) 4.6 - 6.5 % Final    Comment:    Glycemic Control Guidelines for People with Diabetes:Non Diabetic:  <6%Goal of Therapy: <7%Additional Action Suggested:  >8%     CBG: Recent Labs  Lab 10/10/23 1706  GLUCAP 172*    Review of Systems:   AMS not able to obtain  Past Medical History:  He,  has a past medical history of Acute on chronic diastolic congestive heart failure (HCC), Arthus phenomenon, Atrioventricular block, complete (HCC), Cardiac pacemaker in situ (07/22/2010), Chronic kidney disease, stage 3 (HCC), DIABETES MELLITUS, TYPE I, ADULT ONSET (08/06/2007), DM (diabetes mellitus) (HCC), Essential hypertension, benign (08/06/2007), History of second degree heart block, HLD (hyperlipidemia), HYPERLIPIDEMIA (08/06/2007), Hypopotassemia, Morbid obesity (HCC), Obesity, Other and unspecified angina pectoris, and Type I (juvenile type) diabetes mellitus without mention of complication, not stated as uncontrolled.   Surgical History:   Past Surgical History:  Procedure Laterality Date   CATARACT EXTRACTION  8/08   [place]   PPM GENERATOR CHANGEOUT N/A 02/13/2019   Procedure: PPM GENERATOR CHANGEOUT;  Surgeon: Tammie Fall, MD;  Location: [facility];  Service: Cardiovascular;  Laterality: N/A;   PTVDP  12/11   PPM - [employer]   TOTAL HIP ARTHROPLASTY  04/05/06   right     Social History:   reports that he has never smoked. He has never used smokeless  tobacco. He reports that he does not drink alcohol and does not use drugs.   Family History:  His family history includes Asthma in his father and mother; Coronary artery disease in his father and mother; Diabetes in his father; Heart attack (age of onset: 92) in his mother; Hyperlipidemia in his father; Stroke (age of onset: 45) in his father. There is no history of Colon cancer.   Allergies Allergies  Allergen Reactions   Oysters [Shellfish Allergy]     Blisters and swelling   Farxiga  [Dapagliflozin ] Other (See Comments)    Low blood sugar   Apple Juice Other (See Comments)    Sore throat     Home Medications  Prior to Admission medications  Medication Sig Start Date End Date Taking? Authorizing Provider  allopurinol  (ZYLOPRIM ) 100 MG tablet TAKE 1 TABLET(100 MG) BY MOUTH DAILY 09/25/23   Arcadio Knuckles, MD  amoxicillin  (AMOXIL ) 500 MG capsule TAKE ONE CAPSULE BY MOUTH THREE TIMES DAILY. TAKE ONLY WHEN HAVING DENTAL PROCEDURES 07/04/16   Arcadio Knuckles, MD  apixaban  (ELIQUIS ) 2.5 MG TABS tablet Take 1 tablet (2.5 mg total) by mouth 2 (two) times daily. 08/14/23   Arcadio Knuckles, MD  calcitRIOL  (ROCALTROL ) 0.25 MCG capsule TAKE 1 CAPSULE BY MOUTH DAILY RETURN IN ABOUT 6 MONTHS(AROUND 02/04/2023) 08/14/23   Arcadio Knuckles, MD  carvedilol  (COREG ) 6.25 MG tablet TAKE 1 TABLET(6.25 MG) BY MOUTH TWICE DAILY WITH A MEAL 07/03/23   Arcadio Knuckles, MD  Continuous Glucose Receiver (DEXCOM G7 RECEIVER) DEVI 1 Act by Does not apply route daily. 01/06/23   Arcadio Knuckles, MD  Continuous Glucose Sensor (DEXCOM G7 SENSOR) MISC 1 Act by Does not apply route daily. 01/06/23   Arcadio Knuckles, MD  furosemide  (LASIX ) 40 MG tablet Take 2 tablets in the morning and 1 tablet in the afternoon. 01/29/20   Tammie Fall, MD  Glucagon  (GVOKE HYPOPEN  2-PACK) 1 MG/0.2ML SOAJ Inject 1 Act into the skin daily as needed. 04/05/22   Arcadio Knuckles, MD  insulin  isophane & regular human KwikPen (HUMULIN  70/30 KWIKPEN)  (70-30) 100 UNIT/ML KwikPen 40 units in AM and 25 units in PM 09/18/23   Arcadio Knuckles, MD  levocetirizine (XYZAL ) 5 MG tablet Take 1 tablet (5 mg total) by mouth every evening. 09/06/23   Arcadio Knuckles, MD  loratadine (CLARITIN) 10 MG tablet Take 10 mg by mouth daily as needed for allergies.    [provider]  losartan  (COZAAR ) 25 MG tablet Take 25 mg by mouth daily.    [provider]  pravastatin  (PRAVACHOL ) 40 MG tablet Take 1 tablet (40 mg total) by mouth daily. TAKE 1 TABLET(40 MG) BY MOUTH DAILY 05/08/23   Arcadio Knuckles, MD     Critical care time: 15   The patient is critically ill with multiple organ system failure and requires high complexity decision making for assessment and support, frequent evaluation and titration of therapies, advanced monitoring, review of radiographic studies and interpretation of complex data.   Critical Care Time devoted to patient care services, exclusive of separately billable procedures, described in this note is 32 minutes.   Claven Cumming, MD [facility] See Amion for pager  If no response to pager , please call [phone] until 7pm After 7:00 pm call Elink  [phone] 10/10/2023, 11:41 PM

## 2023-10-10 NOTE — Assessment & Plan Note (Signed)
 S/p pacemaker

## 2023-10-10 NOTE — Plan of Care (Signed)
 Connor Dixon ZOX:[id number] DOB: 1934/12/15 DOA: 10/10/2023     PCP: Arcadio Knuckles, MD   Outpatient Specialists: * NONE CARDS: * Dr. None  NEphrology: *  Dr. No care team member to display  NEurology *   Dr. Pulmonary *  Dr.  Oncology * Dr.No care team member to display  GI* Dr.  Cherene Core, LB) No care team member to display Urology Dr. *  Patient arrived to ER on 10/10/23 at 1701 Referred by Attending Connor Mariner, MD   Patient coming from:    home Lives alone,      Chief Complaint:   Chief Complaint  Patient presents with   Altered Mental Status    HPI: Connor Dixon is a 89 y.o. male with medical history significant of a.fib on eliquis , complete heart block sp pacemaker, DM2, HTN, HLD, CKD    Presented with  found down and unresponsive Presents with confusion last known well was 7 PM last night. EMS was called patient was found in recliner in a puddle of urine unresponsive with right-sided facial droop Bilateral arms contracted and nonverbal He lives alone CBG was 236 blood pressure 126/80 He has a history of A-fib on Eliquis  patient is diabetic It seems that he did not take his pills last evening CT head nonacute CTA no LVO Neurology have seen and recommends continuous EEG  Denies significant ETOH intake   Does not smoke    Regarding pertinent Chronic problems:     Hyperlipidemia -  on statins  Pravachol  Lipid Panel     Component Value Date/Time   CHOL 150 05/08/2023 0833   TRIG 53.0 05/08/2023 0833   HDL 51.90 05/08/2023 0833   CHOLHDL 3 05/08/2023 0833   VLDL 10.6 05/08/2023 0833   LDLCALC 88 05/08/2023 0833   LDLCALC 71 12/10/2019 1046     HTN on Coreg , lasix , cozaar     DM 2 -  Lab Results  Component Value Date   HGBA1C 8.7 (H) 09/06/2023   on insulin ,      obesity-   BMI Readings from Last 1 Encounters:  10/10/23 30.50 kg/m     A. Fib -   atrial fibrillation CHA2DS2 vas score     6   current  on anticoagulation with Eliquis ,           -  Rate control:  Currently controlled with  Coreg         CKD stage IIIb baseline Cr 2.2 Estimated Creatinine Clearance: 24.2 mL/min (A) (by C-G formula based on SCr of 2.4 mg/dL (H)).  Lab Results  Component Value Date   CREATININE 2.40 (H) 10/10/2023   CREATININE 2.48 (H) 10/10/2023   CREATININE 2.26 (H) 05/08/2023   Lab Results  Component Value Date   NA 137 10/10/2023   CL 101 10/10/2023   K 4.1 10/10/2023   CO2 23 10/10/2023   BUN 40 (H) 10/10/2023   CREATININE 2.40 (H) 10/10/2023   GFRNONAA 25 (L) 10/10/2023   CALCIUM 9.1 10/10/2023   PHOS 3.6 12/31/2014   ALBUMIN 3.1 (L) 10/10/2023   GLUCOSE 217 (H) 10/10/2023     While in ER:    Ct head and CTA non acute   Lab Orders         Blood Culture (routine x 2)         Protime-INR         APTT         CBC  Differential         Comprehensive metabolic panel         Ethanol         Urine rapid drug screen (hosp performed)         Urinalysis, w/ Reflex to Culture (Infection Suspected) -Urine, Clean Catch         CK         CBG monitoring, ED         I-stat chem 8, ED         I-Stat Lactic Acid, ED      CT HEAD   NON acute  Remote lacunar infarct in the anterior right corona radiata  MRI brain  ordered  CTA No evidence of significant stenosis, aneurysmal dilatation, or dissection involving the arteries of the head and neck. 3. Atherosclerotic changes within the cavernous internal carotid arteries bilaterally without significant stenosis through the ICA termini. The pertinent results were texted to Dr. Alecia Ames via the [facility] system at  CXR -  NON acute    Following Medications were ordered in ER: Medications  iohexol (OMNIPAQUE) 350 MG/ML injection 60 mL (60 mLs Intravenous Contrast Given 10/10/23 1744)  lactated ringers bolus 1,000 mL (0 mLs Intravenous Stopped 10/10/23 1946)    _______________________________________________________ ER Provider Called:    Neurology    Dr.Kirkpatrick,   SEEN in ER continuous EEG  PCCM has been consulted at this time  Case discussed with Dr. Claven Cumming he agrees that patient has dense encephalopathy but at this time feels that ICU/PCCM has nothing to offer    ED Triage Vitals  Encounter Vitals Group     BP 10/10/23 1705 (!) 153/91     Systolic BP Percentile --      Diastolic BP Percentile --      Pulse Rate 10/10/23 1706 (!) 102     Resp 10/10/23 1706 15     Temp 10/10/23 1808 99.1 F (37.3 C)     Temp src --      SpO2 10/10/23 1706 100 %     Weight 10/10/23 1705 200 lb 9.9 oz (91 kg)     Height --      Head Circumference --      Peak Flow --      Pain Score --      Pain Loc --      Pain Education --      Exclude from Growth Chart --   ZOXW(96)@     _________________________________________ Significant initial  Findings: Abnormal Labs Reviewed  PROTIME-INR - Abnormal; Notable for the following components:      Result Value   Prothrombin Time 15.7 (*)    All other components within normal limits  APTT - Abnormal; Notable for the following components:   aPTT 37 (*)    All other components within normal limits  CBC - Abnormal; Notable for the following components:   WBC 10.7 (*)    All other components within normal limits  DIFFERENTIAL - Abnormal; Notable for the following components:   Neutro Abs 8.2 (*)    Monocytes Absolute 1.3 (*)    All other components within normal limits  COMPREHENSIVE METABOLIC PANEL WITH GFR - Abnormal; Notable for the following components:   Glucose, Bld 222 (*)    BUN 41 (*)    Creatinine, Ser 2.48 (*)    Albumin 3.1 (*)    Total Bilirubin 1.8 (*)    GFR, Estimated 25 (*)  All other components within normal limits  URINALYSIS, W/ REFLEX TO CULTURE (INFECTION SUSPECTED) - Abnormal; Notable for the following components:   Glucose, UA 50 (*)    Hgb urine dipstick MODERATE (*)    Protein, ur >=300 (*)    Bacteria, UA RARE (*)    All other components within normal limits  CBG  MONITORING, ED - Abnormal; Notable for the following components:   Glucose-Capillary 172 (*)    All other components within normal limits  I-STAT CHEM 8, ED - Abnormal; Notable for the following components:   BUN 40 (*)    Creatinine, Ser 2.40 (*)    Glucose, Bld 217 (*)    Calcium, Ion 1.14 (*)    All other components within normal limits  TROPONIN I (HIGH SENSITIVITY) - Abnormal; Notable for the following components:   Troponin I (High Sensitivity) 132 (*)    All other components within normal limits    _________________________ Troponin  ordered Cardiac Panel (last 3 results) Recent Labs    10/10/23 1710 10/10/23 1747  CKTOTAL 102  --   TROPONINIHS 147* 132*     ECG: Ordered Personally reviewed and interpreted by me showing: HR : 100 Rhythm:  Paced Atrial fibrillation Nonspecific IVCD with LAD Left ventricular hypertrophy Anterior infarct, old   QTC >600   The recent clinical data is shown below. Vitals:   10/10/23 1809 10/10/23 1810 10/10/23 1815 10/10/23 1830  BP:   (!) 145/95   Pulse: 100 94 100 90  Resp: (!) 32 19 (!) 27 17  Temp: 99.2 F (37.3 C) 99.2 F (37.3 C) 99.3 F (37.4 C) 99.4 F (37.4 C)  SpO2: 100% 100% 100% 100%  Weight:        WBC     Component Value Date/Time   WBC 10.7 (H) 10/10/2023 1710   LYMPHSABS 1.0 10/10/2023 1710   LYMPHSABS 2.0 02/08/2019 1058   MONOABS 1.3 (H) 10/10/2023 1710   EOSABS 0.0 10/10/2023 1710   EOSABS 0.2 02/08/2019 1058   BASOSABS 0.0 10/10/2023 1710   BASOSABS 0.0 02/08/2019 1058    Lactic Acid, Venous    Component Value Date/Time   LATICACIDVEN 2.9 (HH) 10/10/2023 2102       Procalcitonin  0.11      UA   no evidence of UTI mild hematuria   Urine analysis:    Component Value Date/Time   COLORURINE YELLOW 10/10/2023 1747   APPEARANCEUR CLEAR 10/10/2023 1747   LABSPEC 1.011 10/10/2023 1747   PHURINE 5.0 10/10/2023 1747   GLUCOSEU 50 (A) 10/10/2023 1747   GLUCOSEU 100 (A) 05/08/2023 0833    HGBUR MODERATE (A) 10/10/2023 1747   BILIRUBINUR NEGATIVE 10/10/2023 1747   KETONESUR NEGATIVE 10/10/2023 1747   PROTEINUR >=300 (A) 10/10/2023 1747   UROBILINOGEN 0.2 05/08/2023 0833   NITRITE NEGATIVE 10/10/2023 1747   LEUKOCYTESUR NEGATIVE 10/10/2023 1747    Results for orders placed or performed during the hospital encounter of 02/13/19  Surgical PCR screen     Status: None   Collection Time: 02/13/19  6:39 AM   Specimen: Nasal Mucosa; Nasal Swab  Result Value Ref Range Status   MRSA, PCR NEGATIVE NEGATIVE Final   Staphylococcus aureus NEGATIVE NEGATIVE Final    Comment: (NOTE) The Xpert SA Assay (FDA approved for NASAL specimens in patients 40 years of age and older), is one component of a comprehensive surveillance program. It is not intended to diagnose infection nor to guide or monitor treatment. Performed at [facility]  Hospital Lab, [address], [place], [name]       Venous  Blood Gas result:  pH  7.518 High  Sodium 135 mmol/L   pCO2, Ven 29.1 Low  mmHg Potassium 4.4 mmol/L  pO2, Ven 39 mmHg    __________________________________________________________ Recent Labs  Lab 10/10/23 1710 10/10/23 1803  NA 135 137  K 4.0 4.1  CO2 23  --   GLUCOSE 222* 217*  BUN 41* 40*  CREATININE 2.48* 2.40*  CALCIUM 9.1  --     Cr  stable,   Lab Results  Component Value Date   CREATININE 2.40 (H) 10/10/2023   CREATININE 2.48 (H) 10/10/2023   CREATININE 2.26 (H) 05/08/2023    Recent Labs  Lab 10/10/23 1710  AST 34  ALT 14  ALKPHOS 67  BILITOT 1.8*  PROT 6.8  ALBUMIN 3.1*   Lab Results  Component Value Date   CALCIUM 9.1 10/10/2023   PHOS 3.6 12/31/2014    Plt: Lab Results  Component Value Date   PLT 300 10/10/2023       Recent Labs  Lab 10/10/23 1710 10/10/23 1803  WBC 10.7*  --   NEUTROABS 8.2*  --   HGB 15.2 16.0  HCT 46.0 47.0  MCV 91.6  --   PLT 300  --     HG/HCT  stable,      Component Value Date/Time   HGB 16.0 10/10/2023  1803   HGB 15.9 02/08/2019 1058   HCT 47.0 10/10/2023 1803   HCT 47.7 02/08/2019 1058   MCV 91.6 10/10/2023 1710   MCV 91 02/08/2019 1058    _______________________________________________ Hospitalist was called for admission for   Acute encephalopathy     The following Work up has been ordered so far:  Orders Placed This Encounter  Procedures   Blood Culture (routine x 2)   CT HEAD WO CONTRAST   CT ANGIO HEAD NECK W WO CM   DG Chest Port 1 View   MR BRAIN WO CONTRAST   Protime-INR   APTT   CBC   Differential   Comprehensive metabolic panel   Ethanol   Urine rapid drug screen (hosp performed)   Urinalysis, w/ Reflex to Culture (Infection Suspected) -Urine, Clean Catch   CK   Diet NPO time specified   Vital signs   ED Cardiac monitoring   NIH Stroke Scale   Swallow screen   Initiate Carrier Fluid Protocol   If O2 sat <94% Administer O2 @ 2 Liters/Minute If O2 Sat < 94%, administer O2 at 2 liters/minute via nasal cannula.   Insert foley catheter   Check Rectal Temperature   Insert temp foley   Consult to neurology   Consult to hospitalist   ED Pulse oximetry, continuous   CBG monitoring, ED   I-stat chem 8, ED   I-Stat Lactic Acid, ED   ED EKG   EKG 12-Lead   Overnight EEG with video   Saline lock IV     OTHER Significant initial  Findings:  labs showing: DM  labs:  HbA1C: Recent Labs    01/05/23 1633 05/08/23 0833 09/06/23 1508  HGBA1C 8.5* 8.7* 8.7*      CBG (last 3)  Recent Labs    10/10/23 1706  GLUCAP 172*    Cultures: No results found for: "SDES", "SPECREQUEST", "CULT", "REPTSTATUS"   Radiological Exams on Admission: DG Chest Port 1 View Result Date: 10/10/2023 CLINICAL DATA:  Possible sepsis EXAM: PORTABLE CHEST 1 VIEW COMPARISON:  05/08/2023 FINDINGS: The heart size and mediastinal contours are within normal limits. Both lungs are clear. The visualized skeletal structures are unremarkable. Left-sided pacing device as before. IMPRESSION:  No active disease. Electronically Signed   By: Esmeralda Hedge M.D.   On: 10/10/2023 19:00   CT ANGIO HEAD NECK W WO CM Result Date: 10/10/2023 EXAM: CTA Head and Neck with Intravenous Contrast. CLINICAL HISTORY: Neuro deficit, acute, stroke suspected. AMS. Possible stroke. Facial droop. Patient was found unresponsive. TECHNIQUE: Axial CTA images of the head and neck performed with intravenous contrast. Two-dimensional MIP and/or three-dimensional MIP and volume rendered reformations were performed. Note: Per PQRS, the description of internal carotid artery percent stenosis, including 0 percent or normal exam, is based on [place] American Symptomatic Carotid Endarterectomy Trial (NASCET) criteria. Dose reduction technique was used including one or more of the following: automated exposure control, adjustment of mA and kV according to patient size, and/or iterative reconstruction. CONTRAST: 60 mL iohexol (OMNIPAQUE) 350 MG/ML injection. COMPARISON: None provided. FINDINGS: CTA NECK: COMMON CAROTID ARTERIES: No significant stenosis. No dissection or occlusion. INTERNAL CAROTID ARTERIES: No significant stenosis. No dissection or occlusion. VERTEBRAL ARTERIES: No significant stenosis. No dissection or occlusion. CTA HEAD: ANTERIOR CEREBRAL ARTERIES: Atherosclerotic changes are present within the cavernous internal carotid arteries bilaterally without significant stenosis through the ICA termini. No significant stenosis. No occlusion. No aneurysm. MIDDLE CEREBRAL ARTERIES: No significant stenosis. No occlusion. No aneurysm. POSTERIOR CEREBRAL ARTERIES: No significant stenosis. No occlusion. No aneurysm. BASILAR ARTERY: No significant stenosis. No occlusion. No aneurysm. OTHER: SOFT TISSUES: No acute finding. No masses or lymphadenopathy. BONES: No acute osseous abnormality. IMPRESSION: 1. No large vessel occlusion 2. No evidence of significant stenosis, aneurysmal dilatation, or dissection involving the arteries of the  head and neck. 3. Atherosclerotic changes within the cavernous internal carotid arteries bilaterally without significant stenosis through the ICA termini. The pertinent results were texted to Dr. Alecia Ames via the [facility] system at 5:51 PM. Electronically signed by: Audree Leas MD 10/10/2023 06:03 PM EDT RP Workstation: [id number]   CT HEAD WO CONTRAST Result Date: 10/10/2023 EXAM: CT HEAD WITHOUT 10/10/2023 05:17:00 PM TECHNIQUE: CT of the head was performed without the administration of intravenous contrast. Automated exposure control, iterative reconstruction, and/or weight based adjustment of the mA/kV was utilized to reduce the radiation dose to as low as reasonably achievable. COMPARISON: None available. CLINICAL HISTORY: Right-sided facial droop. The patient is unresponsive. Neuro deficit, acute, stroke suspected. FINDINGS: BRAIN AND VENTRICLES: There is no acute intracranial hemorrhage, mass effect or midline shift. No abnormal extra-axial fluid collection. The gray-white differentiation is maintained without evidence of an acute infarct. There is no evidence of hydrocephalus. Moderate atrophy and diffuse periventricular white matter disease are present bilaterally. A remote lacunar infarct is present in the anterior right corona radiata. Atherosclerotic calcifications are present in the cavernous carotid arteries bilaterally and at the dural margin of both vertebral arteries. No hyperdense vessel is present. ASPECTS: Ganglionic level: 7/7 Supraganglionic level: 3/3 Total: 10/10 ORBITS: Bilateral lens replacements are noted. The globes and orbits are otherwise within normal limits. SINUSES: The visualized paranasal sinuses and mastoid air cells demonstrate no acute abnormality. SOFT TISSUES AND SKULL: No acute abnormality of the visualized skull or soft tissues. IMPRESSION: 1. No acute intracranial abnormality.  ASPECTS 10/10 2. Moderate atrophy and diffuse periventricular white matter disease  bilaterally. 3. Remote lacunar infarct in the anterior right corona radiata. The pertinent results were texted to Dr. Alecia Ames via the [facility] system at 5:51 PM. Electronically  signed by: Audree Leas MD 10/10/2023 05:52 PM EDT RP Workstation: [id number]   _______________________________________________________________________________________________________ Latest  Blood pressure (!) 145/95, pulse 90, temperature 99.4 F (37.4 C), resp. rate 17, weight 91 kg, SpO2 100%.   Vitals  labs and radiology finding personally reviewed  Review of Systems:    Pertinent positives include: unable to answer  Constitutional:  No weight loss, night sweats, Fevers, chills, fatigue, weight loss  HEENT:  No headaches, Difficulty swallowing,Tooth/dental problems,Sore throat,  No sneezing, itching, ear ache, nasal congestion, post nasal drip,  Cardio-vascular:  No chest pain, Orthopnea, PND, anasarca, dizziness, palpitations.no Bilateral lower extremity swelling  GI:  No heartburn, indigestion, abdominal pain, nausea, vomiting, diarrhea, change in bowel habits, loss of appetite, melena, blood in stool, hematemesis Resp:  no shortness of breath at rest. No dyspnea on exertion, No excess mucus, no productive cough, No non-productive cough, No coughing up of blood.No change in color of mucus.No wheezing. Skin:  no rash or lesions. No jaundice GU:  no dysuria, change in color of urine, no urgency or frequency. No straining to urinate.  No flank pain.  Musculoskeletal:  No joint pain or no joint swelling. No decreased range of motion. No back pain.  Psych:  No change in mood or affect. No depression or anxiety. No memory loss.  Neuro: no localizing neurological complaints, no tingling, no weakness, no double vision, no gait abnormality, no slurred speech, no confusion  All systems reviewed and apart from HOPI all are  negative _______________________________________________________________________________________________ Past Medical History:   Past Medical History:  Diagnosis Date   Acute on chronic diastolic congestive heart failure (HCC)    Arthus phenomenon    Atrioventricular block, complete (HCC)    Cardiac pacemaker in situ 07/22/2010   Chronic kidney disease, stage 3 (HCC)    Presumably from longstanding DM & HTN. Scr has gradually increased over the past 2 years (1.6 in 12/2010, 2 in 03/2011, and up to 2.1 in 01/2013)   DIABETES MELLITUS, TYPE I, ADULT ONSET 08/06/2007   DM (diabetes mellitus) (HCC)    adult onset    Essential hypertension, benign 08/06/2007   Very good BP control   History of second degree heart block    HLD (hyperlipidemia)    HYPERLIPIDEMIA 08/06/2007   Hypopotassemia    Morbid obesity (HCC)    Obesity    Other and unspecified angina pectoris    typical   Type I (juvenile type) diabetes mellitus without mention of complication, not stated as uncontrolled    (04/25/13 OV with Dr. Irene Mannheim) = Poorly controlled & is working  w/Dr. Alphia Asa to improve this. Most recent Hgb A1c was 8.2% but Mr. Lukacs reports it was down to 7.6 last month.      Past Surgical History:  Procedure Laterality Date   CATARACT EXTRACTION  8/08   [place]   PPM GENERATOR CHANGEOUT N/A 02/13/2019   Procedure: PPM GENERATOR CHANGEOUT;  Surgeon: Tammie Fall, MD;  Location: [facility] INVASIVE CV LAB;  Service: Cardiovascular;  Laterality: N/A;   PTVDP  12/11   PPM - [employer]   TOTAL HIP ARTHROPLASTY  04/05/06   right    Social History:  Ambulatory  independently    reports that he has never smoked. He has never used smokeless tobacco. He reports that he does not drink alcohol and does not use drugs. Family History:  Family History  Problem Relation Age of Onset   Asthma Father    Coronary artery disease Father  Diabetes Father    Hyperlipidemia Father    Stroke Father 85   Coronary  artery disease Mother    Asthma Mother    Heart attack Mother 36   Colon cancer Neg Hx        no definate prostate CA    ______________________________________________________________________________________________ Allergies: Allergies  Allergen Reactions   Oysters [Shellfish Allergy]     Blisters and swelling   Farxiga  [Dapagliflozin ] Other (See Comments)    Low blood sugar   Apple Juice Other (See Comments)    Sore throat     Prior to Admission medications   Medication Sig Start Date End Date Taking? Authorizing Provider  allopurinol  (ZYLOPRIM ) 100 MG tablet TAKE 1 TABLET(100 MG) BY MOUTH DAILY 09/25/23   Arcadio Knuckles, MD  amoxicillin  (AMOXIL ) 500 MG capsule TAKE ONE CAPSULE BY MOUTH THREE TIMES DAILY. TAKE ONLY WHEN HAVING DENTAL PROCEDURES 07/04/16   Arcadio Knuckles, MD  apixaban  (ELIQUIS ) 2.5 MG TABS tablet Take 1 tablet (2.5 mg total) by mouth 2 (two) times daily. 08/14/23   Arcadio Knuckles, MD  calcitRIOL  (ROCALTROL ) 0.25 MCG capsule TAKE 1 CAPSULE BY MOUTH DAILY RETURN IN ABOUT 6 MONTHS(AROUND 02/04/2023) 08/14/23   Arcadio Knuckles, MD  carvedilol  (COREG ) 6.25 MG tablet TAKE 1 TABLET(6.25 MG) BY MOUTH TWICE DAILY WITH A MEAL 07/03/23   Arcadio Knuckles, MD  Continuous Glucose Receiver (DEXCOM G7 RECEIVER) DEVI 1 Act by Does not apply route daily. 01/06/23   Arcadio Knuckles, MD  Continuous Glucose Sensor (DEXCOM G7 SENSOR) MISC 1 Act by Does not apply route daily. 01/06/23   Arcadio Knuckles, MD  furosemide  (LASIX ) 40 MG tablet Take 2 tablets in the morning and 1 tablet in the afternoon. 01/29/20   Tammie Fall, MD  Glucagon  (GVOKE HYPOPEN  2-PACK) 1 MG/0.2ML SOAJ Inject 1 Act into the skin daily as needed. 04/05/22   Arcadio Knuckles, MD  insulin  isophane & regular human KwikPen (HUMULIN  70/30 KWIKPEN) (70-30) 100 UNIT/ML KwikPen 40 units in AM and 25 units in PM 09/18/23   Arcadio Knuckles, MD  levocetirizine (XYZAL ) 5 MG tablet Take 1 tablet (5 mg total) by mouth every evening.  09/06/23   Arcadio Knuckles, MD  loratadine (CLARITIN) 10 MG tablet Take 10 mg by mouth daily as needed for allergies.    [provider]  losartan  (COZAAR ) 25 MG tablet Take 25 mg by mouth daily.    [provider]  pravastatin  (PRAVACHOL ) 40 MG tablet Take 1 tablet (40 mg total) by mouth daily. TAKE 1 TABLET(40 MG) BY MOUTH DAILY 05/08/23   Arcadio Knuckles, MD    ___________________________________________________________________________________________________ Physical Exam:    10/10/2023    6:30 PM 10/10/2023    6:15 PM 10/10/2023    6:10 PM  Vitals with BMI  Systolic  145   Diastolic  95   Pulse 90 100 94   1. General:  in No Acute distress Chronically ill -appearing 2. Psychological:not Oriented, unable to follow commands 3. Head/ENT:  Dry Mucous Membranes                          Head Non traumatic, neck supple                          Poor Dentition 4. SKIN: decreased Skin turgor,  Skin clean Dry and intact no rash    5. Heart:  Regular rate and rhythm no Murmur, no Rub or gallop 6. Lungs: no wheezes or crackles   7. Abdomen: Soft, non-tender, Non distended  8. Lower extremities: no clubbing, cyanosis, no edema 9. Neurologically not following commands  Seems to be retracting right leg to pain Head is rotated to the right gaze is rotated to the right Left arm decorticate 10. MSK: Normal range of motion    Chart has been reviewed  ______________________________________________________________________________________________  Assessment/Plan  89 y.o. male with medical history significant of a.fib on eliquis , complete heart block sp pacemaker, DM2, HTN, HLD, CKD  Admitted for  Acute encephalopathy     Present on Admission:  Acute encephalopathy  Elevated troponin  Essential hypertension  Hyperlipidemia with target LDL less than 70  Paroxysmal atrial fibrillation (HCC)  Sinus node dysfunction (HCC)  Prolonged QT interval     Elevated  troponin Troponin stable, could be in the setting of brain injury  Echo in am If abnormal would get cardiology consult Monitor on tele  Essential hypertension Allow permissive HTN  Hyperlipidemia with target LDL less than 70 Not able to tolerate PO statin at this time  Insulin -requiring or dependent type II diabetes mellitus (HCC)  - Order Sensitive  SSI   -  change insulin  to 20 units qhs  -  check TSH and HgA1C  - Hold by mouth medications    Paroxysmal atrial fibrillation (HCC) Hold eliquis  for tonight, pt unable to tolerate PO  Stroke suspected would not be a good candidate for heparin  Sinus node dysfunction (HCC) Sp pacemaker  Prolonged QT interval - will monitor on tele avoid QT prolonging medications, rehydrate correct electrolytes   Acute encephalopathy Concerning for significant brain injury Continuous EEG ordered MRI in a.m. ordered Rehydrate monitor for any reversible causes. Appreciate neurology consult discussed case at length with son At this point they wish for patient to be DNR and resuscitate DO NOT INTUBATE Family is in route Prognosis at this point appears to be poor Will know more once results of MRI are back Palliative care consult PCCM is also aware at this point they feel that patient has no indication for ICU admission    Other plan as per orders.  DVT prophylaxis:  SCD      Code Status:  DNR/DNI  as per family  I had personally discussed CODE STATUS with  family  ACP   none   Family Communication:   Family  at  Bedside  plan of care was discussed on the phone with   Son,   Diet  Diet Orders (From admission, onward)     Start     Ordered   10/10/23 2239  Diet NPO time specified  Diet effective now        10/10/23 2238            Disposition Plan:     likely will need placement for rehabilitation                         Following barriers for discharge:                              Stroke  work up is complete  Will need consultants to evaluate patient prior to discharge                               Consult Orders  (From admission, onward)           Start     Ordered   10/10/23 1924  Consult to hospitalist  Pg by Landon Pinion  Once       Provider:  (Not yet assigned)  Question Answer Comment  Place call to: [facility]   Reason for Consult Admit      10/10/23 1923                               Would benefit from PT/OT eval prior to DC  Ordered                   Swallow eval - SLP ordered                                     Transition of care consulted                                             Palliative care    consulted                                  Consults called:   Neurology, PCCM   Admission status:  ED Disposition     ED Disposition  Admit   Condition  --   Comment  Hospital Area: [facility] [[id number]]  Level of Care: Progressive [102]  Admit to Progressive based on following criteria: NEUROLOGICAL AND NEUROSURGICAL complex patients with significant risk of instability, who do not meet ICU criteria, yet require close observation or frequent assessment (< / = every 2 - 4 hours) with medical / nursing intervention.  May admit patient to [facility] or [facility] if equivalent level of care is available:: No  Covid Evaluation: Asymptomatic - no recent exposure (last 10 days) testing not required  Diagnosis: Acute encephalopathy [161096]  Admitting Physician: Merary Garguilo [[id number]]  Attending Physician: Juniper Cobey [[id number]]  Certification:: I certify this patient will need inpatient services for at least 2 midnights  Expected Medical Readiness: 10/13/2023          inpatient     I Expect 2 midnight stay secondary to severity of patient's current illness need for inpatient interventions justified by the following:     Severe lab/radiological/exam abnormalities including:    Acute  encephalopathy    and extensive comorbidities including:   DM2   Chronic anticoagulation  That are currently affecting medical management.   I expect  patient to be hospitalized for 2 midnights requiring inpatient medical care.  Patient is at high risk for adverse outcome (such as loss of life or disability) if not treated.  Indication for inpatient stay as follows:  Severe change from baseline regarding mental status  Need for  IV fluids   Level of care        progressive      tele indefinitely please discontinue once patient  no longer qualifies COVID-19 Labs    Critical   Patient is critically ill due to dense encephalopathy They are at high risk for life/limb threatening clinical deterioration requiring frequent reassessment and modifications of care.  Services provided include examination of the patient, review of relevant ancillary tests, prescription of lifesaving therapies, review of medications and prophylactic therapy.  Total critical care time excluding separately billable procedures: 60  Minutes.   Sage Hammill 10/10/2023, 11:25 PM    [facility]     after 2 AM please page floor coverage   If 7AM-7PM, please contact the day team taking care of the patient using [email or web address]

## 2023-10-10 NOTE — Assessment & Plan Note (Signed)
 Hold eliquis  for tonight, pt unable to tolerate PO  Stroke suspected would not be a good candidate for heparin

## 2023-10-10 NOTE — Assessment & Plan Note (Signed)
-  will monitor on tele avoid QT prolonging medications, rehydrate correct electrolytes ? ?

## 2023-10-10 NOTE — Assessment & Plan Note (Signed)
 Allow permissive HTN

## 2023-10-10 NOTE — Assessment & Plan Note (Signed)
-   Order Sensitive  SSI   -  change insulin  to 20 units qhs  -  check TSH and HgA1C  - Hold by mouth medications

## 2023-10-10 NOTE — Progress Notes (Signed)
 LTM EEG hooked up and running - no initial skin breakdown - push button tested - EEG monitoring in lab. No [facility] at this time.

## 2023-10-10 NOTE — Assessment & Plan Note (Signed)
 Concerning for significant brain injury Continuous EEG ordered MRI in a.m. ordered Rehydrate monitor for any reversible causes. Appreciate neurology consult discussed case at length with son At this point they wish for patient to be DNR and resuscitate DO NOT INTUBATE Family is in route Prognosis at this point appears to be poor Will know more once results of MRI are back Palliative care consult PCCM is also aware at this point they feel that patient has no indication for ICU admission

## 2023-10-10 NOTE — Progress Notes (Signed)
 EEG Tech is waiting on patient to move rooms.

## 2023-10-10 NOTE — Code Documentation (Signed)
 Stroke Response Nurse Documentation Code Documentation  Connor Dixon is a 89 y.o. male arriving to [facility]  via [facility] on 10/10/2023 with past medical hx of AF on Eliquis , CHB with PPM HTN CKD DM. . On Eliquis  (apixaban ) daily.   Patient from home  where he was LKW at 1700 yesterday and now complaining of AMS. Pt lives at home and when his caregiver came to assist him, she found him soiled, and slumped over in his chair.  Stroke team alerted following patient arrival. Labs drawn and patient cleared for CT by Dr. Kermit Ped. Patient to CT with team. NIHSS 18, see documentation for details and code stroke times. Patient with disoriented, not following commands, right facial droop, bilateral arm weakness, bilateral leg weakness, and Global aphasia  on exam. The following imaging was completed:  CT Head and CTA. Patient is not a candidate for IV Thrombolytic due to OOW, Eliquis . Patient is not a candidate for IR due to LVO negative on imaging..   Care Plan: NIHSS and VS q 2 x 12, then q 4.                   NPO until stroke swallow screen     Bedside handoff with ED RN Kayleen Party.    Raven Harmes Livengood  Stroke Response RN

## 2023-10-10 NOTE — Plan of Care (Signed)
 LTM EEG reviewed. No electrographic seizure activity appreciated.   Electronically signed: Dr. Janeese Mcgloin

## 2023-10-10 NOTE — ED Notes (Signed)
 Patient to CT scanner with this RN.

## 2023-10-10 NOTE — ED Notes (Signed)
 Unable to do swallow screen due to patient's altered mental status.

## 2023-10-10 NOTE — Subjective & Objective (Signed)
 Presents with confusion last known well was 7 PM last night. EMS was called patient was found in recliner in a puddle of urine unresponsive with right-sided facial droop Bilateral arms contracted and nonverbal He lives alone CBG was 236 blood pressure 126/80 He has a history of A-fib on Eliquis  patient is diabetic It seems that he did not take his pills last evening CT head nonacute CTA no LVO Neurology have seen and recommends continuous EEG

## 2023-10-10 NOTE — Consult Note (Signed)
 NEUROLOGY CONSULT NOTE   Date of service: Oct 10, 2023 Patient Name: Connor Dixon MRN:  [id number] DOB:  1934/10/21 Chief Complaint: "AMS" Requesting Provider: Iva Mariner, MD  History of Present Illness  Connor Dixon is a 89 y.o. male with hx of PAF on Eliquis , CHF, CHB s/p PPM, HTN, HLD, DM type I, CKD, obesity who presents from home via EMS. Patients goddaughter is at the bedside, she states he last spoke to his son who lives in [place]  last night around 1700 yesterday. Goddaughter states she comes over every Tuesday to see him and help clean his house and she found him sitting in the chair slumped to the right, unresponsive. He did not take his pills last evening and was soiled in the chair. CT head with no acute process. CTA head and neck with no LVO .    LKW: 1700 5/26 Modified rankin score: 1-No significant post stroke disability and can perform usual duties with stroke symptoms IV Thrombolysis: No outside window EVT:  No LVO   NIHSS components Score: Comment  1a Level of Conscious 0[x]  1[]  2 3[]      1b LOC Questions 0[]  1[]  2[x]       1c LOC Commands 0[]  1[]  2[x]       2 Best Gaze 0[x]  1[]  2[]       3 Visual 0[x]  1[]  2[]  3[]      4 Facial Palsy 0[]  1[x]  2[]  3[]      5a Motor Arm - left 0[]  1[]  2[x]  3[]  4[]  UN[]    5b Motor Arm - Right 0[]  1[]  2[x]  3[]  4[]  UN[]    6a Motor Leg - Left 0[]  1[]  2[x]  3[]  4[]  UN[]    6b Motor Leg - Right 0[]  1[]  2[x]  3[]  4[]  UN[]    7 Limb Ataxia 0[x]  1[]  2[]  UN[]      8 Sensory 0[x]  1[]  2[]  UN[]      9 Best Language 0[]  1[]  2[]  3[x]      10 Dysarthria 0[]  1[]  2[x]  UN[]      11 Extinct. and Inattention 0[x]  1[]  2[]       TOTAL: 18      ROS  Comprehensive ROS Unable to ascertain due to AMS  Past History   Past Medical History:  Diagnosis Date   Acute on chronic diastolic congestive heart failure (HCC)    Arthus phenomenon    Atrioventricular block, complete (HCC)    Cardiac pacemaker in situ 07/22/2010   Chronic kidney disease, stage 3 (HCC)     Presumably from longstanding DM & HTN. Scr has gradually increased over the past 2 years (1.6 in 12/2010, 2 in 03/2011, and up to 2.1 in 01/2013)   DIABETES MELLITUS, TYPE I, ADULT ONSET 08/06/2007   DM (diabetes mellitus) (HCC)    adult onset    Essential hypertension, benign 08/06/2007   Very good BP control   History of second degree heart block    HLD (hyperlipidemia)    HYPERLIPIDEMIA 08/06/2007   Hypopotassemia    Morbid obesity (HCC)    Obesity    Other and unspecified angina pectoris    typical   Type I (juvenile type) diabetes mellitus without mention of complication, not stated as uncontrolled    (04/25/13 OV with Dr. Irene Mannheim) = Poorly controlled & is working  w/Dr. Alphia Asa to improve this. Most recent Hgb A1c was 8.2% but Mr. Eichhorst reports it was down to 7.6 last month.    Past Surgical History:  Procedure Laterality Date   CATARACT EXTRACTION  8/08  [place]   PPM GENERATOR CHANGEOUT N/A 02/13/2019   Procedure: PPM GENERATOR CHANGEOUT;  Surgeon: Tammie Fall, MD;  Location: [facility] INVASIVE CV LAB;  Service: Cardiovascular;  Laterality: N/A;   PTVDP  12/11   PPM - [facility]   TOTAL HIP ARTHROPLASTY  04/05/06   right    Family History: Family History  Problem Relation Age of Onset   Asthma Father    Coronary artery disease Father    Diabetes Father    Hyperlipidemia Father    Stroke Father 78   Coronary artery disease Mother    Asthma Mother    Heart attack Mother 2   Colon cancer Neg Hx        no definate prostate CA     Social History  reports that he has never smoked. He has never used smokeless tobacco. He reports that he does not drink alcohol and does not use drugs.  Allergies  Allergen Reactions   Oysters [Shellfish Allergy]     Blisters and swelling   Farxiga  [Dapagliflozin ] Other (See Comments)    Low blood sugar   Apple Juice Other (See Comments)    Sore throat    Medications   Current Facility-Administered Medications:    lactated  ringers bolus 1,000 mL, 1,000 mL, Intravenous, Once, Iva Mariner, MD  Current Outpatient Medications:    allopurinol  (ZYLOPRIM ) 100 MG tablet, TAKE 1 TABLET(100 MG) BY MOUTH DAILY, Disp: 90 tablet, Rfl: 0   amoxicillin  (AMOXIL ) 500 MG capsule, TAKE ONE CAPSULE BY MOUTH THREE TIMES DAILY. TAKE ONLY WHEN HAVING DENTAL PROCEDURES, Disp: 30 capsule, Rfl: 2   apixaban  (ELIQUIS ) 2.5 MG TABS tablet, Take 1 tablet (2.5 mg total) by mouth 2 (two) times daily., Disp: 60 tablet, Rfl: 5   calcitRIOL  (ROCALTROL ) 0.25 MCG capsule, TAKE 1 CAPSULE BY MOUTH DAILY RETURN IN ABOUT 6 MONTHS(AROUND 02/04/2023), Disp: 90 capsule, Rfl: 0   carvedilol  (COREG ) 6.25 MG tablet, TAKE 1 TABLET(6.25 MG) BY MOUTH TWICE DAILY WITH A MEAL, Disp: 180 tablet, Rfl: 0   Continuous Glucose Receiver (DEXCOM G7 RECEIVER) DEVI, 1 Act by Does not apply route daily., Disp: 9 each, Rfl: 1   Continuous Glucose Sensor (DEXCOM G7 SENSOR) MISC, 1 Act by Does not apply route daily., Disp: 9 each, Rfl: 1   furosemide  (LASIX ) 40 MG tablet, Take 2 tablets in the morning and 1 tablet in the afternoon., Disp: 270 tablet, Rfl: 3   Glucagon  (GVOKE HYPOPEN  2-PACK) 1 MG/0.2ML SOAJ, Inject 1 Act into the skin daily as needed., Disp: 2 mL, Rfl: 5   insulin  isophane & regular human KwikPen (HUMULIN  70/30 KWIKPEN) (70-30) 100 UNIT/ML KwikPen, 40 units in AM and 25 units in PM, Disp: 60 mL, Rfl: 1   levocetirizine (XYZAL ) 5 MG tablet, Take 1 tablet (5 mg total) by mouth every evening., Disp: 90 tablet, Rfl: 1   loratadine (CLARITIN) 10 MG tablet, Take 10 mg by mouth daily as needed for allergies., Disp: , Rfl:    losartan  (COZAAR ) 25 MG tablet, Take 25 mg by mouth daily., Disp: , Rfl:    pravastatin  (PRAVACHOL ) 40 MG tablet, Take 1 tablet (40 mg total) by mouth daily. TAKE 1 TABLET(40 MG) BY MOUTH DAILY, Disp: 90 tablet, Rfl: 1  Vitals   Vitals:   10/10/23 1705 10/10/23 1706  BP: (!) 153/91   Pulse:  (!) 102  Resp:  15  SpO2:  100%  Weight: 91 kg      Body mass index is 30.5  kg/m.  Physical Exam   Constitutional: critically ill elderly male Psych: Affect appropriate to situation.   Eyes: No scleral injection.   HENT: No OP obstruction.   Head: Normocephalic.   Cardiovascular: Normal rate and regular rhythm.   Respiratory: Effort normal, non-labored breathing.   GI: Soft.  No distension. There is no tenderness.   Skin: WDI.    Neurologic Examination    Mental Status -  Eyes are open, not following commands or answering any questions, head is turned to the left   Cranial Nerves II - XII - II - Visual field intact OU . III, IV, VI - Extraocular movements intact . V - Facial sensation intact bilaterally . VII - Facial movement intact bilaterally . VIII - Hearing & vestibular intact bilaterally . X - Palate elevates symmetrically . XI - Chin turning & shoulder shrug intact bilaterally . XII - Tongue protrusion intact .  Motor Strength - bilateral uppers are flexed to chest. Spontaneous movement of left leg, withdraws wrist flexion in bilateral upper extremities, withdraws in bilateral lower , bilateral hands are clenched shut Bulk was normal and fasciculations were absent.   Motor Tone - increased tone in bilateral uppers  Sensory - responds to noxious stimuli with withdrawal in all 4 extremities Coordination - unable to assess  Gait and Station - deferred.  Labs/Imaging/Neurodiagnostic studies   CBC: No results for input(s): "WBC", "NEUTROABS", "HGB", "HCT", "MCV", "PLT" in the last 168 hours. Basic Metabolic Panel:  Lab Results  Component Value Date   NA 137 05/08/2023   K 3.8 05/08/2023   CO2 31 05/08/2023   GLUCOSE 228 (H) 05/08/2023   BUN 60 (H) 05/08/2023   CREATININE 2.26 (H) 05/08/2023   CALCIUM 9.0 05/08/2023   GFRNONAA 27 (L) 12/10/2019   GFRAA 32 (L) 12/10/2019   Lipid Panel:  Lab Results  Component Value Date   LDLCALC 88 05/08/2023   HgbA1c:  Lab Results  Component Value Date   HGBA1C 8.7 (H)  09/06/2023   Urine Drug Screen: No results found for: "LABOPIA", "COCAINSCRNUR", "LABBENZ", "AMPHETMU", "THCU", "LABBARB"  Alcohol Level No results found for: "ETH" INR No results found for: "INR" APTT No results found for: "APTT" AED levels: No results found for: "PHENYTOIN", "ZONISAMIDE", "LAMOTRIGINE", "LEVETIRACETA"  CT Head without contrast(Personally reviewed):  No acute process. Moderate atrophy and diffuse periventricular white matter disease bilaterally. Remote lacunar infarct in the anterior right corona radiata.  CT angio Head and Neck with contrast(Personally reviewed): NO LVO    ASSESSMENT   CINQUE BEGLEY is a 89 y.o. male  hx of PAF on Eliquis , CHF, CHB s/p PPM, HTN, HLD, DM type I, CKD, obesity who presents from home via EMS after being found by family in the chair unresponsive, in urine, slumped to the right   RECOMMENDATIONS   - STAT cEEG  - MRI brain - (has PPM appears to be MRI compatible) - Evaluate for infection- CBC, UA, CXR - Check UDS  - Neurology will continue to follow   ______________________________________________________________________  Signed, Laymond Priestly, NP [facility]  I have seen the patient and reviewed the above note.  He presents with dense encephalopathy with flexor positioning of his bilateral arms.  He continues to have brisk withdrawal of bilateral legs, and with bilateral arm involvement, seizure would be very unlikely to get this presentation but I do think that continuous EEG would be prudent given his unclear dense encephalopathy.  I was concerned for possible basilar lesion and  therefore he was taken for emergent CTA which does not demonstrate any large vessel occlusion.  Metabolic/septic encephalopathy are also possible, but he is afebrile with a supple neck so my suspicion for meningitis is low.  If he were to spike a fever, I would have a low threshold for starting empiric meningitic coverage but he is not a candidate  for urgent LP given his anticoagulation.  Further recommendations as the above workup is completed and neurology will continue to follow.  Connor Keto, MD [facility]   If 7pm- 7am, please page neurology on call as listed in AMION.

## 2023-10-10 NOTE — ED Triage Notes (Signed)
 Patient BIB [facility] c/o altered mental status.  Patient last known well at 1700 yesterday.  EMS found patient sitting in his recliner in a puddle of urine, unresponsive.  Patient appears to have dried vomit around mouth.  Patient has right side facial droop.  Bilateral arms contracted, non-verbal.  Patient does live by himself.  CBG 236 RR 26-30 98% RA 126/80

## 2023-10-10 NOTE — Assessment & Plan Note (Signed)
 Not able to tolerate PO statin at this time

## 2023-10-10 NOTE — ED Provider Notes (Signed)
 [place] EMERGENCY DEPARTMENT AT [facility] Provider Note   CSN: [id number] Arrival date & time: 10/10/23  1701     History  Chief Complaint  Patient presents with   Altered Mental Status    Connor Dixon is a 89 y.o. male.  HPI Patient presents for altered mental status.  Medical history includes HTN, DM, HLD, CHF, CKD, secondary heart block s/p pacemaker placement.  Per family, patient lives independently.  He was last spoken to on the telephone yesterday evening at 5 PM.  Today, he was found at home, sitting in the chair soaked in urine and millimeter spots noted.  It was noted that he did not take his evening medications last night.  EMS reports that patient's vital signs prior to arrival were normal.  His CBG was in the range of 200.  He was nonverbal but would occasionally grunt.    Home Medications Prior to Admission medications   Medication Sig Start Date End Date Taking? Authorizing Provider  allopurinol  (ZYLOPRIM ) 100 MG tablet TAKE 1 TABLET(100 MG) BY MOUTH DAILY 09/25/23   Arcadio Knuckles, MD  amoxicillin  (AMOXIL ) 500 MG capsule TAKE ONE CAPSULE BY MOUTH THREE TIMES DAILY. TAKE ONLY WHEN HAVING DENTAL PROCEDURES 07/04/16   Arcadio Knuckles, MD  apixaban  (ELIQUIS ) 2.5 MG TABS tablet Take 1 tablet (2.5 mg total) by mouth 2 (two) times daily. 08/14/23   Arcadio Knuckles, MD  calcitRIOL  (ROCALTROL ) 0.25 MCG capsule TAKE 1 CAPSULE BY MOUTH DAILY RETURN IN ABOUT 6 MONTHS(AROUND 02/04/2023) 08/14/23   Arcadio Knuckles, MD  carvedilol  (COREG ) 6.25 MG tablet TAKE 1 TABLET(6.25 MG) BY MOUTH TWICE DAILY WITH A MEAL 07/03/23   Arcadio Knuckles, MD  Continuous Glucose Receiver (DEXCOM G7 RECEIVER) DEVI 1 Act by Does not apply route daily. 01/06/23   Arcadio Knuckles, MD  Continuous Glucose Sensor (DEXCOM G7 SENSOR) MISC 1 Act by Does not apply route daily. 01/06/23   Arcadio Knuckles, MD  furosemide  (LASIX ) 40 MG tablet Take 2 tablets in the morning and 1 tablet in the afternoon.  01/29/20   Tammie Fall, MD  Glucagon  (GVOKE HYPOPEN  2-PACK) 1 MG/0.2ML SOAJ Inject 1 Act into the skin daily as needed. 04/05/22   Arcadio Knuckles, MD  insulin  isophane & regular human KwikPen (HUMULIN  70/30 KWIKPEN) (70-30) 100 UNIT/ML KwikPen 40 units in AM and 25 units in PM 09/18/23   Arcadio Knuckles, MD  levocetirizine (XYZAL ) 5 MG tablet Take 1 tablet (5 mg total) by mouth every evening. 09/06/23   Arcadio Knuckles, MD  loratadine (CLARITIN) 10 MG tablet Take 10 mg by mouth daily as needed for allergies.    [provider]  losartan  (COZAAR ) 25 MG tablet Take 25 mg by mouth daily.    [provider]  pravastatin  (PRAVACHOL ) 40 MG tablet Take 1 tablet (40 mg total) by mouth daily. TAKE 1 TABLET(40 MG) BY MOUTH DAILY 05/08/23   Arcadio Knuckles, MD      Allergies    Lorry Rouge allergy], Farxiga  [dapagliflozin ], and Apple juice    Review of Systems   Review of Systems  Unable to perform ROS: Mental status change    Physical Exam Updated Vital Signs BP (!) 143/102   Pulse (!) 101   Temp 99.4 F (37.4 C)   Resp (!) 21   Wt 91 kg   SpO2 100%   BMI 30.50 kg/m  Physical Exam Vitals and nursing note reviewed.  Constitutional:      General: He is not in acute distress.    Appearance: He is well-developed and normal weight. He is ill-appearing. He is not toxic-appearing or diaphoretic.  HENT:     Head: Normocephalic and atraumatic.     Right Ear: External ear normal.     Left Ear: External ear normal.     Nose: Nose normal.     Mouth/Throat:     Mouth: Mucous membranes are moist.  Eyes:     Conjunctiva/sclera: Conjunctivae normal.     Comments: Right gaze deviation  Neck:     Comments: Head oriented to right side. Cardiovascular:     Rate and Rhythm: Normal rate and regular rhythm.     Heart sounds: No murmur heard. Pulmonary:     Effort: Pulmonary effort is normal. No respiratory distress.     Breath sounds: Normal breath sounds. No wheezing or  rales.  Chest:     Chest wall: No tenderness.  Abdominal:     General: There is no distension.     Palpations: Abdomen is soft.     Tenderness: There is no abdominal tenderness.  Musculoskeletal:        General: No swelling or deformity.     Cervical back: Neck supple.  Skin:    General: Skin is warm and dry.     Coloration: Skin is not jaundiced or pale.  Neurological:     GCS: GCS eye subscore is 4. GCS verbal subscore is 1. GCS motor subscore is 4.     Cranial Nerves: Facial asymmetry present.     Comments: Patient's upper extremities are contracted.  He is moving all extremities and withdrawing from pain.  Exam limited by patient's current mental state.     ED Results / Procedures / Treatments   Labs (all labs ordered are listed, but only abnormal results are displayed) Labs Reviewed  PROTIME-INR - Abnormal; Notable for the following components:      Result Value   Prothrombin Time 15.7 (*)    All other components within normal limits  APTT - Abnormal; Notable for the following components:   aPTT 37 (*)    All other components within normal limits  CBC - Abnormal; Notable for the following components:   WBC 10.7 (*)    All other components within normal limits  DIFFERENTIAL - Abnormal; Notable for the following components:   Neutro Abs 8.2 (*)    Monocytes Absolute 1.3 (*)    All other components within normal limits  COMPREHENSIVE METABOLIC PANEL WITH GFR - Abnormal; Notable for the following components:   Glucose, Bld 222 (*)    BUN 41 (*)    Creatinine, Ser 2.48 (*)    Albumin 3.1 (*)    Total Bilirubin 1.8 (*)    GFR, Estimated 25 (*)    All other components within normal limits  URINALYSIS, W/ REFLEX TO CULTURE (INFECTION SUSPECTED) - Abnormal; Notable for the following components:   Glucose, UA 50 (*)    Hgb urine dipstick MODERATE (*)    Protein, ur >=300 (*)    Bacteria, UA RARE (*)    All other components within normal limits  OSMOLALITY - Abnormal;  Notable for the following components:   Osmolality 318 (*)    All other components within normal limits  CBG MONITORING, ED - Abnormal; Notable for the following components:   Glucose-Capillary 172 (*)    All other components within normal limits  I-STAT CHEM  8, ED - Abnormal; Notable for the following components:   BUN 40 (*)    Creatinine, Ser 2.40 (*)    Glucose, Bld 217 (*)    Calcium, Ion 1.14 (*)    All other components within normal limits  I-STAT CG4 LACTIC ACID, ED - Abnormal; Notable for the following components:   Lactic Acid, Venous 2.9 (*)    All other components within normal limits  I-STAT VENOUS BLOOD GAS, ED - Abnormal; Notable for the following components:   pH, Ven 7.518 (*)    pCO2, Ven 29.1 (*)    Calcium, Ion 0.96 (*)    All other components within normal limits  TROPONIN I (HIGH SENSITIVITY) - Abnormal; Notable for the following components:   Troponin I (High Sensitivity) 132 (*)    All other components within normal limits  TROPONIN I (HIGH SENSITIVITY) - Abnormal; Notable for the following components:   Troponin I (High Sensitivity) 147 (*)    All other components within normal limits  CULTURE, BLOOD (ROUTINE X 2)  CULTURE, BLOOD (ROUTINE X 2)  ETHANOL  RAPID URINE DRUG SCREEN, HOSP PERFORMED  CK  MAGNESIUM  PHOSPHORUS  CREATININE, URINE, RANDOM  OSMOLALITY, URINE  TSH  PROCALCITONIN  PREALBUMIN  CBC WITH DIFFERENTIAL/PLATELET  COMPREHENSIVE METABOLIC PANEL WITH GFR  LACTIC ACID, PLASMA  LACTIC ACID, PLASMA  AMMONIA  LIPID PANEL  I-STAT CG4 LACTIC ACID, ED  TROPONIN I (HIGH SENSITIVITY)  TROPONIN I (HIGH SENSITIVITY)    EKG EKG Interpretation Date/Time:  Tuesday Oct 10 2023 17:23:58 EDT Ventricular Rate:  100 PR Interval:    QRS Duration:  156 QT Interval:  493 QTC Calculation: 636 R Axis:   -79  Text Interpretation: Atrial fibrillation Nonspecific IVCD with LAD Left ventricular hypertrophy Anterior infarct, old Confirmed by Iva Mariner [phone]) on 10/10/2023 5:32:03 PM  Radiology DG Chest Port 1 View Result Date: 10/10/2023 CLINICAL DATA:  Possible sepsis EXAM: PORTABLE CHEST 1 VIEW COMPARISON:  05/08/2023 FINDINGS: The heart size and mediastinal contours are within normal limits. Both lungs are clear. The visualized skeletal structures are unremarkable. Left-sided pacing device as before. IMPRESSION: No active disease. Electronically Signed   By: Esmeralda Hedge M.D.   On: 10/10/2023 19:00   CT ANGIO HEAD NECK W WO CM Result Date: 10/10/2023 EXAM: CTA Head and Neck with Intravenous Contrast. CLINICAL HISTORY: Neuro deficit, acute, stroke suspected. AMS. Possible stroke. Facial droop. Patient was found unresponsive. TECHNIQUE: Axial CTA images of the head and neck performed with intravenous contrast. Two-dimensional MIP and/or three-dimensional MIP and volume rendered reformations were performed. Note: Per PQRS, the description of internal carotid artery percent stenosis, including 0 percent or normal exam, is based on [place] American Symptomatic Carotid Endarterectomy Trial (NASCET) criteria. Dose reduction technique was used including one or more of the following: automated exposure control, adjustment of mA and kV according to patient size, and/or iterative reconstruction. CONTRAST: 60 mL iohexol (OMNIPAQUE) 350 MG/ML injection. COMPARISON: None provided. FINDINGS: CTA NECK: COMMON CAROTID ARTERIES: No significant stenosis. No dissection or occlusion. INTERNAL CAROTID ARTERIES: No significant stenosis. No dissection or occlusion. VERTEBRAL ARTERIES: No significant stenosis. No dissection or occlusion. CTA HEAD: ANTERIOR CEREBRAL ARTERIES: Atherosclerotic changes are present within the cavernous internal carotid arteries bilaterally without significant stenosis through the ICA termini. No significant stenosis. No occlusion. No aneurysm. MIDDLE CEREBRAL ARTERIES: No significant stenosis. No occlusion. No aneurysm. POSTERIOR CEREBRAL  ARTERIES: No significant stenosis. No occlusion. No aneurysm. BASILAR ARTERY: No significant stenosis. No occlusion. No  aneurysm. OTHER: SOFT TISSUES: No acute finding. No masses or lymphadenopathy. BONES: No acute osseous abnormality. IMPRESSION: 1. No large vessel occlusion 2. No evidence of significant stenosis, aneurysmal dilatation, or dissection involving the arteries of the head and neck. 3. Atherosclerotic changes within the cavernous internal carotid arteries bilaterally without significant stenosis through the ICA termini. The pertinent results were texted to Dr. Alecia Ames via the [facility] system at 5:51 PM. Electronically signed by: Audree Leas MD 10/10/2023 06:03 PM EDT RP Workstation: [id number]   CT HEAD WO CONTRAST Result Date: 10/10/2023 EXAM: CT HEAD WITHOUT 10/10/2023 05:17:00 PM TECHNIQUE: CT of the head was performed without the administration of intravenous contrast. Automated exposure control, iterative reconstruction, and/or weight based adjustment of the mA/kV was utilized to reduce the radiation dose to as low as reasonably achievable. COMPARISON: None available. CLINICAL HISTORY: Right-sided facial droop. The patient is unresponsive. Neuro deficit, acute, stroke suspected. FINDINGS: BRAIN AND VENTRICLES: There is no acute intracranial hemorrhage, mass effect or midline shift. No abnormal extra-axial fluid collection. The gray-white differentiation is maintained without evidence of an acute infarct. There is no evidence of hydrocephalus. Moderate atrophy and diffuse periventricular white matter disease are present bilaterally. A remote lacunar infarct is present in the anterior right corona radiata. Atherosclerotic calcifications are present in the cavernous carotid arteries bilaterally and at the dural margin of both vertebral arteries. No hyperdense vessel is present. ASPECTS: Ganglionic level: 7/7 Supraganglionic level: 3/3 Total: 10/10 ORBITS: Bilateral lens replacements are  noted. The globes and orbits are otherwise within normal limits. SINUSES: The visualized paranasal sinuses and mastoid air cells demonstrate no acute abnormality. SOFT TISSUES AND SKULL: No acute abnormality of the visualized skull or soft tissues. IMPRESSION: 1. No acute intracranial abnormality.  ASPECTS 10/10 2. Moderate atrophy and diffuse periventricular white matter disease bilaterally. 3. Remote lacunar infarct in the anterior right corona radiata. The pertinent results were texted to Dr. Alecia Ames via the [facility] system at 5:51 PM. Electronically signed by: Audree Leas MD 10/10/2023 05:52 PM EDT RP Workstation: [id number]    Procedures Procedures    Medications Ordered in ED Medications  insulin  aspart (novoLOG) injection 0-9 Units (has no administration in time range)   stroke: early stages of recovery book (has no administration in time range)  0.9 %  sodium chloride  infusion (has no administration in time range)  acetaminophen  (TYLENOL ) tablet 650 mg (has no administration in time range)    Or  acetaminophen  (TYLENOL ) 160 MG/5ML solution 650 mg (has no administration in time range)    Or  acetaminophen  (TYLENOL ) suppository 650 mg (has no administration in time range)  iohexol (OMNIPAQUE) 350 MG/ML injection 60 mL (60 mLs Intravenous Contrast Given 10/10/23 1744)  lactated ringers bolus 1,000 mL (0 mLs Intravenous Stopped 10/10/23 1946)    ED Course/ Medical Decision Making/ A&P                                 Medical Decision Making Amount and/or Complexity of Data Reviewed Labs: ordered. Radiology: ordered.  Risk Decision regarding hospitalization.   This patient presents to the ED for concern of altered mental status, this involves an extensive number of treatment options, and is a complaint that carries with it a high risk of complications and morbidity.  The differential diagnosis includes CVA, ICH, seizure, polypharmacy, infection, metabolic  derangements   Co morbidities / Chronic conditions that complicate the patient evaluation  HTN, DM, HLD,  CHF, CKD, secondary heart block s/p pacemaker placement   Additional history obtained:  Additional history obtained from EMR External records from outside source obtained and reviewed including EMS, patient's family   Lab Tests:  I Ordered, and personally interpreted labs.  The pertinent results include: Baseline creatinine, normal electrolytes, normal hemoglobin.  A mild leukocytosis present.  Troponin is elevated but flat on repeat.  Lactate is elevated.  Respiratory alkalosis on blood gas.   Imaging Studies ordered:  I ordered imaging studies including chest x-ray, CT head, CTA head and neck, MRI brain I independently visualized and interpreted imaging which showed no acute findings on x-ray or CT imaging.  MRI brain pending at time of admission I agree with the radiologist interpretation   Cardiac Monitoring: / EKG:  The patient was maintained on a cardiac monitor.  I personally viewed and interpreted the cardiac monitored which showed an underlying rhythm of: Atrial fibrillation   Problem List / ED Course / Critical interventions / Medication management  Patient presenting for altered mental status.  Last known well was reportedly 5 PM last night.  He arrives today at 5 PM with GCS of 9.  He appears to have a right gaze and head deviation.  He has some mild facial asymmetry.  He is not currently following commands.  He is able to move all extremities and withdraw from pain.  He does appear to have some voluntary upper extremity contracting.  Patient is currently maintaining his airway.  He was placed on bedside cardiac monitor.  He was taken to CT scanner.  Noncontrasted CT scan did not show any acute findings.  I discussed with neurologist on-call, Dr. Alecia Ames, who is concerned of possible basilar occlusion.  CTA of head and neck was ordered.  This study was also  negative.  Dr. Alecia Ames ordered EEG for concern of possible seizure.  Plan will be for MRI as well.  Of note, patient does have a pacemaker.  Laboratory workup was ordered to assess for metabolic causes of his encephalopathy.  Results are notable for mild leukocytosis and lactic acidosis.  There is no clear source of infection.  Temperature sensing Foley catheter was placed and patient remained normothermic.  He did subsequently relax his upper extremities.  At times, he would orient his eyes toward provider.  He remained nonverbal and not following commands.  I spoke with hospitalist, Dr. Hendrick Locke, who was initially hesitant to admit.  I spoke with critical care who evaluated the patient.  They do not feel the patient needs ICU care at this time.  Patient was admitted to hospitalist, Dr. Hendrick Locke. I ordered medication including IV fluids for hydration Reevaluation of the patient after these medicines showed that the patient improved I have reviewed the patients home medicines and have made adjustments as needed   Consultations Obtained:  I requested consultation with the neurologist, Dr. Alecia Ames,  and discussed lab and imaging findings as well as pertinent plan - they recommend: EEG, MRI, neurology will follow I requested consultation with the intensivist, Dr. Mason Sole,  and discussed lab and imaging findings as well as pertinent plan - they recommend: Hospitalist admission   Social Determinants of Health:  Lives independently  CRITICAL CARE Performed by: Iva Mariner   Total critical care time: 34 minutes  Critical care time was exclusive of separately billable procedures and treating other patients.  Critical care was necessary to treat or prevent imminent or life-threatening deterioration.  Critical care was time spent personally by me on  the following activities: development of treatment plan with patient and/or surrogate as well as nursing, discussions with consultants, evaluation of  patient's response to treatment, examination of patient, obtaining history from patient or surrogate, ordering and performing treatments and interventions, ordering and review of laboratory studies, ordering and review of radiographic studies, pulse oximetry and re-evaluation of patient's condition.         Final Clinical Impression(s) / ED Diagnoses Final diagnoses:  Acute encephalopathy    Rx / DC Orders ED Discharge Orders     None         Iva Mariner, MD 10/10/23 2357

## 2023-10-10 NOTE — ED Notes (Signed)
 EDP at bedside

## 2023-10-10 NOTE — ED Notes (Signed)
 Attempted cultures x 2.  Phlebotomy notified.

## 2023-10-11 ENCOUNTER — Inpatient Hospital Stay (HOSPITAL_COMMUNITY)

## 2023-10-11 ENCOUNTER — Other Ambulatory Visit (HOSPITAL_COMMUNITY): Payer: Self-pay | Admitting: Radiology

## 2023-10-11 ENCOUNTER — Encounter (HOSPITAL_COMMUNITY)

## 2023-10-11 DIAGNOSIS — R293 Abnormal posture: Secondary | ICD-10-CM | POA: Diagnosis not present

## 2023-10-11 DIAGNOSIS — R569 Unspecified convulsions: Secondary | ICD-10-CM

## 2023-10-11 DIAGNOSIS — Z515 Encounter for palliative care: Secondary | ICD-10-CM | POA: Diagnosis not present

## 2023-10-11 DIAGNOSIS — I6389 Other cerebral infarction: Secondary | ICD-10-CM | POA: Diagnosis not present

## 2023-10-11 DIAGNOSIS — I480 Paroxysmal atrial fibrillation: Secondary | ICD-10-CM | POA: Diagnosis not present

## 2023-10-11 DIAGNOSIS — R7989 Other specified abnormal findings of blood chemistry: Secondary | ICD-10-CM

## 2023-10-11 DIAGNOSIS — G9340 Encephalopathy, unspecified: Secondary | ICD-10-CM | POA: Diagnosis not present

## 2023-10-11 DIAGNOSIS — I495 Sick sinus syndrome: Secondary | ICD-10-CM

## 2023-10-11 LAB — CBG MONITORING, ED
Glucose-Capillary: 180 mg/dL — ABNORMAL HIGH (ref 70–99)
Glucose-Capillary: 213 mg/dL — ABNORMAL HIGH (ref 70–99)
Glucose-Capillary: 235 mg/dL — ABNORMAL HIGH (ref 70–99)
Glucose-Capillary: 246 mg/dL — ABNORMAL HIGH (ref 70–99)

## 2023-10-11 LAB — COMPREHENSIVE METABOLIC PANEL WITH GFR
ALT: 14 U/L (ref 0–44)
AST: 29 U/L (ref 15–41)
Albumin: 2.9 g/dL — ABNORMAL LOW (ref 3.5–5.0)
Alkaline Phosphatase: 61 U/L (ref 38–126)
Anion gap: 12 (ref 5–15)
BUN: 41 mg/dL — ABNORMAL HIGH (ref 8–23)
CO2: 25 mmol/L (ref 22–32)
Calcium: 9.1 mg/dL (ref 8.9–10.3)
Chloride: 100 mmol/L (ref 98–111)
Creatinine, Ser: 2.31 mg/dL — ABNORMAL HIGH (ref 0.61–1.24)
GFR, Estimated: 27 mL/min — ABNORMAL LOW (ref >60)
Glucose, Bld: 276 mg/dL — ABNORMAL HIGH (ref 70–99)
Potassium: 4.3 mmol/L (ref 3.5–5.1)
Sodium: 137 mmol/L (ref 135–145)
Total Bilirubin: 1.9 mg/dL — ABNORMAL HIGH (ref 0.0–1.2)
Total Protein: 6.4 g/dL — ABNORMAL LOW (ref 6.5–8.1)

## 2023-10-11 LAB — PREALBUMIN: Prealbumin: 13 mg/dL — ABNORMAL LOW (ref 18–38)

## 2023-10-11 LAB — LIPID PANEL
Cholesterol: 139 mg/dL (ref 0–200)
HDL: 59 mg/dL (ref >40)
LDL Cholesterol: 68 mg/dL (ref 0–99)
Total CHOL/HDL Ratio: 2.4 ratio
Triglycerides: 62 mg/dL (ref <150)
VLDL: 12 mg/dL (ref 0–40)

## 2023-10-11 LAB — CBC WITH DIFFERENTIAL/PLATELET
Abs Immature Granulocytes: 0.03 10*3/uL (ref 0.00–0.07)
Basophils Absolute: 0 10*3/uL (ref 0.0–0.1)
Basophils Relative: 0 %
Eosinophils Absolute: 0 10*3/uL (ref 0.0–0.5)
Eosinophils Relative: 0 %
HCT: 44.4 % (ref 39.0–52.0)
Hemoglobin: 15.1 g/dL (ref 13.0–17.0)
Immature Granulocytes: 0 %
Lymphocytes Relative: 14 %
Lymphs Abs: 1.3 10*3/uL (ref 0.7–4.0)
MCH: 30.9 pg (ref 26.0–34.0)
MCHC: 34 g/dL (ref 30.0–36.0)
MCV: 90.8 fL (ref 80.0–100.0)
Monocytes Absolute: 1.3 10*3/uL — ABNORMAL HIGH (ref 0.1–1.0)
Monocytes Relative: 13 %
Neutro Abs: 7 10*3/uL (ref 1.7–7.7)
Neutrophils Relative %: 73 %
Platelets: 276 10*3/uL (ref 150–400)
RBC: 4.89 MIL/uL (ref 4.22–5.81)
RDW: 14.5 % (ref 11.5–15.5)
WBC: 9.7 10*3/uL (ref 4.0–10.5)
nRBC: 0 % (ref 0.0–0.2)

## 2023-10-11 LAB — GLUCOSE, CAPILLARY
Glucose-Capillary: 231 mg/dL — ABNORMAL HIGH (ref 70–99)
Glucose-Capillary: 234 mg/dL — ABNORMAL HIGH (ref 70–99)
Glucose-Capillary: 271 mg/dL — ABNORMAL HIGH (ref 70–99)

## 2023-10-11 LAB — AMMONIA: Ammonia: <13 umol/L (ref 9–35)

## 2023-10-11 LAB — LACTIC ACID, PLASMA
Lactic Acid, Venous: 2.7 mmol/L (ref 0.5–1.9)
Lactic Acid, Venous: 1.7 mmol/L (ref 0.5–1.9)

## 2023-10-11 LAB — TROPONIN I (HIGH SENSITIVITY): Troponin I (High Sensitivity): 126 ng/L (ref <18)

## 2023-10-11 LAB — ECHOCARDIOGRAM COMPLETE
S' Lateral: 3 cm
Weight: 3209.9 [oz_av]

## 2023-10-11 MED ORDER — DEXAMETHASONE SODIUM PHOSPHATE 10 MG/ML IJ SOLN
12.0000 mg | Freq: Four times a day (QID) | INTRAMUSCULAR | Status: DC
  Administered 2023-10-11 – 2023-10-13 (×8): 12 mg via INTRAVENOUS
  Filled 2023-10-11 (×9): qty 2

## 2023-10-11 MED ORDER — SODIUM CHLORIDE 0.9 % IV SOLN
2.0000 g | Freq: Three times a day (TID) | INTRAVENOUS | Status: DC
  Administered 2023-10-11 – 2023-10-13 (×6): 2 g via INTRAVENOUS
  Filled 2023-10-11 (×9): qty 2000

## 2023-10-11 MED ORDER — DEXTROSE 5 % IV SOLN
900.0000 mg | INTRAVENOUS | Status: DC
  Administered 2023-10-11: 900 mg via INTRAVENOUS
  Filled 2023-10-11 (×2): qty 18

## 2023-10-11 MED ORDER — SODIUM CHLORIDE 0.9 % IV SOLN
2.0000 g | Freq: Two times a day (BID) | INTRAVENOUS | Status: DC
  Administered 2023-10-11 – 2023-10-13 (×4): 2 g via INTRAVENOUS
  Filled 2023-10-11 (×5): qty 20

## 2023-10-11 MED ORDER — VANCOMYCIN HCL 1500 MG/300ML IV SOLN
1500.0000 mg | Freq: Once | INTRAVENOUS | Status: AC
  Administered 2023-10-11: 1500 mg via INTRAVENOUS
  Filled 2023-10-11: qty 300

## 2023-10-11 MED ORDER — VANCOMYCIN HCL IN DEXTROSE 1-5 GM/200ML-% IV SOLN
1000.0000 mg | INTRAVENOUS | Status: DC
  Administered 2023-10-12: 1000 mg via INTRAVENOUS
  Filled 2023-10-11 (×2): qty 200

## 2023-10-11 NOTE — Progress Notes (Signed)
 PT Cancellation Note  Patient Details Name: Connor Dixon MRN: [id number] DOB: 10-23-1934   Cancelled Treatment:    Reason Eval/Treat Not Completed: Patient's level of consciousness. Pt too lethargic.   Pura Browns [facility] 10/11/2023, 10:58 AM Angelina Kempf PT Acute Rehabilitation Services Office [phone]

## 2023-10-11 NOTE — Procedures (Signed)
 Patient Name: Connor Dixon  MRN: [id number]  Epilepsy Attending: Arleene Lack  Referring Physician/Provider: Augustin Leber, MD  Duration: 10/10/2023 2111 to 10/11/2023 1722  Patient history: 89 y.o. male  hx of PAF on Eliquis , CHF, CHB s/p PPM, HTN, HLD, DM type I, CKD, obesity who presents from home via EMS after being found by family in the chair unresponsive, in urine, slumped to the right. EEG to evaluate for seizure   Level of alertness: Awake, asleep  AEDs during EEG study: None  Technical aspects: This EEG study was done with scalp electrodes positioned according to the 10-20 International system of electrode placement. Electrical activity was reviewed with band pass filter of 1-70Hz , sensitivity of 7 uV/mm, display speed of 72mm/sec with a 60Hz  notched filter applied as appropriate. EEG data were recorded continuously and digitally stored.  Video monitoring was available and reviewed as appropriate.  Description: No clear posterior dominant rhythm was seen. Sleep was characterized by vertex waves, sleep spindles (12 to 14 Hz), maximal frontocentral region. EEG showed continuous generalized 3 to 6 Hz theta-delta slowing. Hyperventilation and photic stimulation were not performed.     ABNORMALITY - Continuous slow, generalized  IMPRESSION: This study is suggestive of moderate diffuse encephalopathy. No seizures or epileptiform discharges were seen throughout the recording.  Yarelis Ambrosino O [name]

## 2023-10-11 NOTE — Inpatient Diabetes Management (Signed)
 Inpatient Diabetes Program Recommendations  AACE/ADA: New Consensus Statement on Inpatient Glycemic Control (2015)  Target Ranges:  Prepandial:   less than 140 mg/dL      Peak postprandial:   less than 180 mg/dL (1-2 hours)      Critically ill patients:  140 - 180 mg/dL   Lab Results  Component Value Date   GLUCAP 180 (H) 10/11/2023   HGBA1C 8.7 (H) 09/06/2023    Review of Glycemic Control  Latest Reference Range & Units 10/11/23 00:20 10/11/23 03:59 10/11/23 08:26  Glucose-Capillary 70 - 99 mg/dL 161 (H) 096 (H) 045 (H)  (H): Data is abnormally high Diabetes history: Type 2 DM Outpatient Diabetes medications: Humulin  70/30- 40 units QA/ 25 units QP Current orders for Inpatient glycemic control: Novolog 0-9 units Q4H  Inpatient Diabetes Program Recommendations:    Consider adding Semglee  14 units every day  Thanks, Marjo Sievert, MSN, RNC-OB Diabetes Coordinator [phone] (8a-5p)

## 2023-10-11 NOTE — ED Notes (Signed)
 Transported to MRI with SWOT RN

## 2023-10-11 NOTE — Progress Notes (Signed)
 LTM VIDEO EEG discontinued - no skin breakdown at [facility]

## 2023-10-11 NOTE — Progress Notes (Signed)
 Pharmacy Antibiotic Note  Connor Dixon is a 89 y.o. male admitted on 10/10/2023 with possible meningitis.  Pharmacy has been consulted for acyclovir and vancomycin dosing. Patient is also on ampicillin and ceftriaxone.   Plan: Vancomycin 1500mg  x1 then 1000mg  q24hr Acyclovir 900m,g q24hr  Ceftriaxone 2g q12h Ampicillin adjusted to 2g q8hr  Monitor cultures, clinical status, renal function, vancomycin level F/u MRI, LP  Narrow abx as able and f/u duration   Weight: 91 kg (200 lb 9.9 oz)  Temp (24hrs), Avg:99.1 F (37.3 C), Min:98.4 F (36.9 C), Max:99.8 F (37.7 C)  Recent Labs  Lab 10/10/23 1710 10/10/23 1803 10/10/23 2102 10/11/23 0037  WBC 10.7*  --   --  9.7  CREATININE 2.48* 2.40*  --  2.31*  LATICACIDVEN  --   --  2.9* 2.7*    Estimated Creatinine Clearance: 25.1 mL/min (A) (by C-G formula based on SCr of 2.31 mg/dL (H)).    Allergies  Allergen Reactions   Oysters [Shellfish Allergy] Hives and Swelling   Citrus Hives   Pecan Extract Hives   Apple Juice Other (See Comments)    Sore throat   Farxiga  [Dapagliflozin ] Other (See Comments)    Low blood sugar    Antimicrobials this admission: Vanc 5/28 >> Amp 5/28 >> Ceftriaxone 5/28 >> Acyclovir 5/28 >>  Dose adjustments this admission: N/a  Microbiology results: 5/27 BCx: ngtd     Thank you for allowing pharmacy to be a part of this patient's care.  Dorene Gang, PharmD, BCPS, BCCP Clinical Pharmacist  Please check AMION for all [facility] Pharm[facility] phone numbers After 10:00 PM, call Main Pharmacy [phone]

## 2023-10-11 NOTE — ED Notes (Signed)
Dr. Kirkpatrick at bedside 

## 2023-10-11 NOTE — Progress Notes (Signed)
 Pt is moved to 3w14, atrium monitoring.

## 2023-10-11 NOTE — ED Notes (Signed)
 Called floor to let them know patient will be coming up

## 2023-10-11 NOTE — Progress Notes (Signed)
  Device system confirmed to be MRI conditional, with implant date > 6 weeks ago, and no evidence of abandoned or epicardial leads in review of most recent CXR  Device last cleared by EP Provider: Creighton Doffing 10/11/23  Clearance is good through for 1 year as long as parameters remain stable at time of check. If pt undergoes a cardiac device procedure during that time, they should be re-cleared.   Tachy-therapies to be programmed off if applicable with device back to pre-MRI settings after completion of exam.  [employer] will be present for programming for the MRI.   Arlys Lamer, RT  10/11/2023 1:11 PM

## 2023-10-11 NOTE — Progress Notes (Signed)
 Progress Note   Patient: Connor Dixon ZOX:[id number] DOB: 06/11/1934 DOA: 10/10/2023     1 DOS: the patient was seen and examined on 10/11/2023   Brief hospital course: The patient is a 89 yr old man who was brought to the ED via EMS after he was found slumped over in his chair to the right and unresponsive by his Goddaughter who comes over every Tuesday to check on him and help him with the house. Prior to this the last time that the patient was "normal" was in a conversation with his son who lives in [place]  at 1700 on 10/09/2023. He apparently had not taken any of his medications on the evening of 5/26 and had soiled the chair.   The patient's past medical history includes: CHF - chronic Arthus phenomenon Complete AV block, s/p PCM CKD 3b DM I HTN Hyperlipidemia Morbid obesity  In the ED the patient underwent a CT head that demonstrated no acute process. A CTA head and neck demonstrated no LVO. There was however evidence of a remote lacunar infarct of the anterior right corona radiata. I have discussed the patient with Dr. Alecia Ames. There is concern for Seizure, Posterior Circulation CVA, and meningitis. The patient will need to be off of anticoagulation for at least 48 hours. LP is planned for 10/12/2023. The patient will be started on empiric antibiotic coverage for meningitis.   MRI is pending. EEG is in process. Neurology has evaluated the patient.   Assessment and Plan: 89 y.o. male with medical history significant of a.fib on eliquis , complete heart block sp pacemaker, DM2, HTN, HLD, CKD  Admitted for  Acute encephalopathy      Present on Admission:  Acute encephalopathy  Elevated troponin  Essential hypertension  Hyperlipidemia with target LDL less than 70  Paroxysmal atrial fibrillation (HCC)  Sinus node dysfunction (HCC)  Prolonged QT interval     Elevated troponin Troponin stable, could be in the setting of brain injury  Echo in am If abnormal would get  cardiology consult Monitor on tele   Essential hypertension Allow permissive HTN   Hyperlipidemia with target LDL less than 70 Not able to tolerate PO statin at this time   Insulin -requiring or dependent type II diabetes mellitus (HCC)  - Order Sensitive  SSI   -  change insulin  to 20 units qhs  -  check TSH and HgA1C  - Hold by mouth medications      Paroxysmal atrial fibrillation (HCC) Hold eliquis  for tonight, pt unable to tolerate PO  Stroke suspected would not be a good candidate for heparin   Sinus node dysfunction (HCC) Sp pacemaker   Prolonged QT interval - will monitor on tele avoid QT prolonging medications, rehydrate correct electrolytes     Acute encephalopathy Concerning for significant brain injury Continuous EEG ordered MRI in a.m. ordered Rehydrate monitor for any reversible causes. Appreciate neurology consult discussed case at length with son At this point they wish for patient to be DNR and resuscitate DO NOT INTUBATE Family is in route Prognosis at this point appears to be poor Will know more once results of MRI are back Palliative care consult PCCM is also aware at this point they feel that patient has no indication for ICU admission Concern for meningitis. The patient will be placed on empiric antibiotics with plans for LP on 10/12/2023.     I have seen and examined this patient myself. I have spent 40 minutes in his evaluation and care including time spent  in coordination of care with neurology and discussing patient with family at bedside.   DVT prophylaxis:  SCD      Code Status:  DNR/DNI  as per family  I had personally discussed CODE STATUS with  family  ACP   none    Family Communication:   Family  at  Bedside  plan of care was discussed. Diet  Pending swallow eval.      Subjective: The patient is clearly encephalopathic. His gaze is to the right, he has notable right facial droop. His right upper extremity has spastic paralysis. He  is moving bilateral lower extremities. He is not verbal and is not able to follow commands or respond verbally.  Physical Exam: Vitals:   10/11/23 1000 10/11/23 1130 10/11/23 1215 10/11/23 1300  BP: (!) 156/85 (!) 145/91 (!) 159/91 (!) 134/96  Pulse: 92 80 91 92  Resp: 19 16 19  (!) 21  Temp: 98.8 F (37.1 C) 98.7 F (37.1 C) 98.6 F (37 C) 98.6 F (37 C)  SpO2: 100% 97% 100% 100%  Weight:       Exam:  Constitutional:  The patient is awake, alert, and oriented x 3. No acute distress. Eyes:  pupils and irises appear normal Normal lids and conjunctivae ENMT:  Noted right facial droop. Neck:  neck appears normal, no masses, normal ROM, supple no thyromegaly Respiratory:  No increased work of breathing. No wheezes, rales, or rhonchi No tactile fremitus Cardiovascular:  Regular rate and rhythm No murmurs, ectopy, or gallups. No lateral PMI. No thrills. Abdomen:  Abdomen is soft, non-tender, non-distended No hernias, masses, or organomegaly Normoactive bowel sounds.  Musculoskeletal:  No cyanosis, clubbing, or edema Skin:  No rashes, lesions, ulcers palpation of skin: no induration or nodules Neurologic:  Right facial droop Non verbal, not following commands Spastic contracture of the right upper extremity Now moving lower extremity bilateral. Psychiatric:  Patient is unable to cooperate with exam.   Data Reviewed:  CTA head and neck CT head  Family Communication: Son is at bedside.   Disposition: Status is: Inpatient Remains inpatient appropriate because: DDX includes posterior circulation CVA, Seizure disorder, and meningitis. The patient is at significant risk of further deterioration of organ function and/or death. Work up is pending including MRI and LP.  Planned Discharge Destination: TBD    Time spent: 40 minutes  Author: Amish Mintzer, DO 10/11/2023 3:02 PM  For on call review [email or web address].

## 2023-10-11 NOTE — Progress Notes (Signed)
 OT Cancellation Note  Patient Details Name: ELBY BLACKWELDER MRN: [id number] DOB: [birth year]   Cancelled Treatment:    Reason Eval/Treat Not Completed: Patient's level of consciousness. Pt too lethargic to participate in OT eval at this time. OT will continue to follow acutely for evaluation. Note: Palliative consult in place.   Ebony Goldstein Ritvik Mczeal 10/11/2023, 10:58 AM  [place] OTR/L Acute Rehabilitation Services Office: [phone]

## 2023-10-11 NOTE — ED Notes (Signed)
 Palliative care at bedside.

## 2023-10-11 NOTE — Consult Note (Signed)
 Consultation Note Date: 10/11/2023 at 1015  Patient Name: Connor Dixon  DOB: 1934/08/29  MRN: [id number]  Age / Sex: 89 y.o., male  PCP: Arcadio Knuckles, MD Referring Physician: Junita Oliva, DO  HPI/Patient Profile: 89 y.o. male  with past medical history of A-fib (Eliquis ), s/p PPM for second-degree heart block, HTN, CKD (stage III), type 1 diabetes, morbid obesity, and HLD admitted on 10/10/2023 after being found down at home.  Last known well was 5/26 at 1700.  EGD revealed moderate diffuse encephalopathy with no seizures or epileptiform discharges noted.  Overnight EEG results pending.  Pending.  Workup remains in progress.  CT of the head revealed remote lacunar infarct in the anterior right corona radiata.  CTA revealed no evidence of significant stenosis, aneurysmal dilatation, or dissection involving the arteries of the head and neck. MRI pending.  PMT was consulted to support patient and family with goals of care discussions..   Clinical Assessment and Goals of Care: Extensive chart review completed prior to meeting patient including labs, vital signs, imaging, progress notes, orders, and available advanced directive documents from current and previous encounters. I then met with patient and his son [name] at bedside in the ED to discuss diagnosis prognosis, GOC, EOL wishes, disposition and options.  Patient is awake but unable to participate in goals of care medical decision making independently at this time.  Entirety of discussion was with patient's son [name].  I introduced Palliative Medicine as specialized medical care for people living with serious illness. It focuses on providing relief from the symptoms and stress of a serious illness. The goal is to improve quality of life for both the patient and the family.  We discussed a brief life review of the patient.  Patient is a widow.  He has 2  sons, [name] that resides in [place]  and [name] who resides in [place] .  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  He has lived independently at home up until this most recent event.  He has a caregiver who would help with light housework and "keep eyes on him" that checked on him once a week.  However, recently family was considering increasing this care to twice a week.  We discussed patient's current illness-potential stroke-and what it means in the larger context of patient's on-going co-morbidities-advanced age, A-fib, HTN.    I attempted to elicit values and goals of care important to the patient.  [name] shares patient has been very clear with his boundaries of medical care.  Patient has created a POA and living will.  [name] shares he reviewed it briefly before coming to the hospital today.  He knows that his father would never be accepting of life-prolonging measures.  DNR with limited interventions discussed and [name] remains in agreement with DNR limited interventions.  Discussed that current findings are not conclusive.  However, MRI will hopefully reveal further clarification as to patient's current medical situation.  Discussed importance of functional, nutritional,  and cognitive status as significant indicators of patient's overall prognosis.  [name] shares he has a clear understanding but is realistic.  He shares he believes his father has had a stroke.  However, he is awaiting results of MRI to further discuss prognosis.  Of note, patient's other son is traveling from [place] and coming to the hospital this afternoon.  [name] was appreciative of our visit and was in agreement for PMT to continue to follow.  Reviewed that we will continue to discuss goals and boundaries of care once results of MRI are known.  No change to plan of care at this time.  Primary Decision Maker NEXT OF KIN  Physical Exam Vitals  reviewed.  Constitutional:      General: He is not in acute distress. HENT:     Head: Normocephalic.     Mouth/Throat:     Mouth: Mucous membranes are moist.  Cardiovascular:     Rate and Rhythm: Normal rate.  Pulmonary:     Effort: Pulmonary effort is normal.  Abdominal:     Palpations: Abdomen is soft.  Skin:    General: Skin is warm and dry.  Neurological:     Mental Status: He is alert.     Cranial Nerves: Facial asymmetry present.     Comments: Non-verbal     Palliative Assessment/Data: 30%     Thank you for this consult. Palliative medicine will continue to follow and assist holistically.   Time Total: 75 minutes  Time spent includes: Detailed review of medical records (labs, imaging, vital signs), medically appropriate exam (mental status, respiratory, cardiac, skin), discussed with treatment team, counseling and educating patient, family and staff, documenting clinical information, medication management and coordination of care.  Signed by: Eller Gut, DNP, FNP-BC Palliative Medicine   Please contact Palliative Medicine Team providers via [facility] for questions and concerns.

## 2023-10-11 NOTE — Progress Notes (Addendum)
 NEUROLOGY CONSULT FOLLOW UP NOTE   Date of service: Oct 11, 2023 Patient Name: Connor Dixon MRN:  [id number] DOB:  [birth year]  Interval Hx/subjective   No significant changes.  MRI was attempted this afternoon, but unfortunately he was not able to cooperate enough to get it done. Vitals   Vitals:   10/11/23 1345 10/11/23 1415 10/11/23 1445 10/11/23 1515  BP:      Pulse: (!) 57 91 85 89  Resp: 19 16 18 19   Temp: 98.6 F (37 C) 98.4 F (36.9 C) 98.5 F (36.9 C) 98.5 F (36.9 C)  SpO2: (!) 82% 100% 100% 100%  Weight:         Body mass index is 30.5 kg/m.  Physical Exam   Constitutional: Appears elderly  Neurologic Examination    MS: Opens eyes to mild noxious stimulation, does not follow commands or engage with the examiner.  He does appear slightly more awake today. CN: Eyes are midline, he does appear to move his eyes across midline at times, but does not clearly fixate or track.  Pupils are reactive, anyplace tight with stimulation Motor: Flexion versus withdrawal in bilateral upper extremities, withdrawal in bilateral lowers Sensory: As above  Medications  Current Facility-Administered Medications:     stroke: early stages of recovery book, , Does not apply, Once, Doutova, Anastassia, MD   0.9 %  sodium chloride  infusion, , Intravenous, Continuous, Doutova, Anastassia, MD, Stopped at 10/11/23 1124   acetaminophen  (TYLENOL ) tablet 650 mg, 650 mg, Oral, Q4H PRN **OR** acetaminophen  (TYLENOL ) 160 MG/5ML solution 650 mg, 650 mg, Per Tube, Q4H PRN **OR** acetaminophen  (TYLENOL ) suppository 650 mg, 650 mg, Rectal, Q4H PRN, Doutova, Anastassia, MD   ampicillin (OMNIPEN) 2 g in sodium chloride  0.9 % 100 mL IVPB, 2 g, Intravenous, Q4H, Swayze, Ava, DO   cefTRIAXone (ROCEPHIN) 2 g in sodium chloride  0.9 % 100 mL IVPB, 2 g, Intravenous, Q12H, Swayze, Ava, DO   dexamethasone (DECADRON) injection 12 mg, 12 mg, Intravenous, Q6H, Swayze, Ava, DO   insulin  aspart (novoLOG) injection  0-9 Units, 0-9 Units, Subcutaneous, Q4H, Doutova, Anastassia, MD, 3 Units at 10/11/23 1150   vancomycin (VANCOREADY) IVPB 2000 mg/400 mL, 2,000 mg, Intravenous, Q12H, Swayze, Ava, DO  Labs and Diagnostic Imaging   Procalcitonin 0.11 pCO2 29 Ammonia less than 13 Creatinine 2.31 with a BUN of 41 TSH 1.002 CK is normal Overnight EEG is normal  Assessment   Connor Dixon is a 89 y.o. male who presented with severe encephalopathy which is currently of unclear etiology.  He does appear to have flexion posturing in his upper extremities, so I continue to suspect acute ischemic stroke, however this is not definite and I was hoping the MRI would answer the question today.  With continued diagnostic uncertainty, though my suspicion for meningitis is low, I would favor proceeding with lumbar puncture but he is on Eliquis , and it is slightly unclear when he took his last dose. I would therefore favor holding off on LP until tomorrow and treating with empiric coverage in the meantime.  Recommendations  Will reattempt MRI brain likely tomorrow Repeat head CT for now given delay Can discontinue EEG monitoring Empiric antibiotics including ampicillin, ceftriaxone, vancomycin, acyclovir Neurology will continue to follow. ______________________________________________________________________   Flint Hummer, MD [facility]

## 2023-10-12 ENCOUNTER — Inpatient Hospital Stay (HOSPITAL_COMMUNITY)

## 2023-10-12 DIAGNOSIS — I495 Sick sinus syndrome: Secondary | ICD-10-CM | POA: Diagnosis not present

## 2023-10-12 DIAGNOSIS — G9340 Encephalopathy, unspecified: Secondary | ICD-10-CM | POA: Diagnosis not present

## 2023-10-12 DIAGNOSIS — R7989 Other specified abnormal findings of blood chemistry: Secondary | ICD-10-CM | POA: Diagnosis not present

## 2023-10-12 DIAGNOSIS — R4182 Altered mental status, unspecified: Secondary | ICD-10-CM | POA: Diagnosis not present

## 2023-10-12 DIAGNOSIS — Z515 Encounter for palliative care: Secondary | ICD-10-CM | POA: Diagnosis not present

## 2023-10-12 LAB — GLUCOSE, CAPILLARY
Glucose-Capillary: 289 mg/dL — ABNORMAL HIGH (ref 70–99)
Glucose-Capillary: 278 mg/dL — ABNORMAL HIGH (ref 70–99)
Glucose-Capillary: 305 mg/dL — ABNORMAL HIGH (ref 70–99)
Glucose-Capillary: 326 mg/dL — ABNORMAL HIGH (ref 70–99)
Glucose-Capillary: 324 mg/dL — ABNORMAL HIGH (ref 70–99)

## 2023-10-12 LAB — CBC WITH DIFFERENTIAL/PLATELET
Abs Immature Granulocytes: 0.04 10*3/uL (ref 0.00–0.07)
Basophils Absolute: 0 10*3/uL (ref 0.0–0.1)
Basophils Relative: 0 %
Eosinophils Absolute: 0 10*3/uL (ref 0.0–0.5)
Eosinophils Relative: 0 %
HCT: 45 % (ref 39.0–52.0)
Hemoglobin: 14.9 g/dL (ref 13.0–17.0)
Immature Granulocytes: 1 %
Lymphocytes Relative: 7 %
Lymphs Abs: 0.5 10*3/uL — ABNORMAL LOW (ref 0.7–4.0)
MCH: 30.3 pg (ref 26.0–34.0)
MCHC: 33.1 g/dL (ref 30.0–36.0)
MCV: 91.6 fL (ref 80.0–100.0)
Monocytes Absolute: 0.1 10*3/uL (ref 0.1–1.0)
Monocytes Relative: 2 %
Neutro Abs: 7.5 10*3/uL (ref 1.7–7.7)
Neutrophils Relative %: 90 %
Platelets: 251 10*3/uL (ref 150–400)
RBC: 4.91 MIL/uL (ref 4.22–5.81)
RDW: 14.6 % (ref 11.5–15.5)
WBC: 8.2 10*3/uL (ref 4.0–10.5)
nRBC: 0 % (ref 0.0–0.2)

## 2023-10-12 LAB — BASIC METABOLIC PANEL WITH GFR
Anion gap: 16 — ABNORMAL HIGH (ref 5–15)
BUN: 48 mg/dL — ABNORMAL HIGH (ref 8–23)
CO2: 19 mmol/L — ABNORMAL LOW (ref 22–32)
Calcium: 8.9 mg/dL (ref 8.9–10.3)
Chloride: 106 mmol/L (ref 98–111)
Creatinine, Ser: 2.45 mg/dL — ABNORMAL HIGH (ref 0.61–1.24)
GFR, Estimated: 25 mL/min — ABNORMAL LOW (ref >60)
Glucose, Bld: 301 mg/dL — ABNORMAL HIGH (ref 70–99)
Potassium: 4.3 mmol/L (ref 3.5–5.1)
Sodium: 141 mmol/L (ref 135–145)

## 2023-10-12 LAB — BETA-HYDROXYBUTYRIC ACID: Beta-Hydroxybutyric Acid: 2.23 mmol/L — ABNORMAL HIGH (ref 0.05–0.27)

## 2023-10-12 MED ORDER — LORAZEPAM 2 MG/ML IJ SOLN
2.0000 mg | Freq: Once | INTRAMUSCULAR | Status: AC
  Administered 2023-10-12: 2 mg via INTRAVENOUS

## 2023-10-12 MED ORDER — CHLORHEXIDINE GLUCONATE CLOTH 2 % EX PADS
6.0000 | MEDICATED_PAD | Freq: Every day | CUTANEOUS | Status: DC
Start: 2023-10-13 — End: 2023-10-17
  Administered 2023-10-13 – 2023-10-16 (×4): 6 via TOPICAL

## 2023-10-12 MED ORDER — LORAZEPAM 2 MG/ML IJ SOLN
INTRAMUSCULAR | Status: AC
  Filled 2023-10-12: qty 1

## 2023-10-12 MED ORDER — INSULIN GLARGINE-YFGN 100 UNIT/ML ~~LOC~~ SOLN
20.0000 [IU] | Freq: Every day | SUBCUTANEOUS | Status: DC
  Administered 2023-10-12 – 2023-10-21 (×9): 20 [IU] via SUBCUTANEOUS
  Filled 2023-10-12 (×11): qty 0.2

## 2023-10-12 MED ORDER — LORAZEPAM 2 MG/ML IJ SOLN
INTRAMUSCULAR | Status: AC
Start: 2023-10-12 — End: 2023-10-12
  Filled 2023-10-12: qty 1

## 2023-10-12 MED ORDER — LORAZEPAM 2 MG/ML IJ SOLN
1.0000 mg | Freq: Once | INTRAMUSCULAR | Status: AC
  Administered 2023-10-12: 1 mg via INTRAVENOUS

## 2023-10-12 MED ORDER — INSULIN ASPART 100 UNIT/ML IJ SOLN
6.0000 [IU] | Freq: Once | INTRAMUSCULAR | Status: AC
  Administered 2023-10-12: 6 [IU] via SUBCUTANEOUS

## 2023-10-12 MED ORDER — INSULIN ASPART 100 UNIT/ML IJ SOLN
0.0000 [IU] | Freq: Three times a day (TID) | INTRAMUSCULAR | Status: DC
  Administered 2023-10-13: 11 [IU] via SUBCUTANEOUS
  Administered 2023-10-13: 7 [IU] via SUBCUTANEOUS
  Administered 2023-10-13 – 2023-10-14 (×2): 11 [IU] via SUBCUTANEOUS
  Administered 2023-10-14: 7 [IU] via SUBCUTANEOUS
  Administered 2023-10-14: 11 [IU] via SUBCUTANEOUS
  Administered 2023-10-15: 4 [IU] via SUBCUTANEOUS
  Administered 2023-10-15 (×2): 3 [IU] via SUBCUTANEOUS
  Administered 2023-10-16: 4 [IU] via SUBCUTANEOUS
  Administered 2023-10-16 (×2): 3 [IU] via SUBCUTANEOUS
  Administered 2023-10-17: 4 [IU] via SUBCUTANEOUS
  Administered 2023-10-17: 3 [IU] via SUBCUTANEOUS
  Administered 2023-10-18 (×2): 4 [IU] via SUBCUTANEOUS
  Administered 2023-10-18: 15 [IU] via SUBCUTANEOUS
  Administered 2023-10-19 – 2023-10-20 (×3): 7 [IU] via SUBCUTANEOUS
  Administered 2023-10-20 – 2023-10-21 (×2): 4 [IU] via SUBCUTANEOUS

## 2023-10-12 MED ORDER — DEXTROSE 5 % IV SOLN
800.0000 mg | INTRAVENOUS | Status: DC
  Administered 2023-10-12: 800 mg via INTRAVENOUS
  Filled 2023-10-12 (×2): qty 16

## 2023-10-12 NOTE — Progress Notes (Signed)
 Progress Note   Patient: Connor Dixon:[id number] DOB: 1934-07-11 DOA: 10/10/2023     2 DOS: the patient was seen and examined on 10/12/2023   Brief hospital course: The patient is a 89 yr old man who was brought to the ED via EMS after he was found slumped over in his chair to the right and unresponsive by his Goddaughter who comes over every Tuesday to check on him and help him with the house. Prior to this the last time that the patient was "normal" was in a conversation with his son who lives in [place]  at 1700 on 10/09/2023. He apparently had not taken any of his medications on the evening of 5/26 and had soiled the chair.   The patient's past medical history includes: CHF - chronic Arthus phenomenon Complete AV block, s/p PCM CKD 3b DM I HTN Hyperlipidemia Morbid obesity  In the ED the patient underwent a CT head that demonstrated no acute process. A CTA head and neck demonstrated no LVO. There was however evidence of a remote lacunar infarct of the anterior right corona radiata. I have discussed the patient with Dr. Alecia Ames. There is concern for Seizure, Posterior Circulation CVA, and meningitis. The patient will need to be off of anticoagulation for at least 48 hours. LP is planned for 10/12/2023. The patient will be started on empiric antibiotic coverage for meningitis.   MRI was performed and demonstrated no acute intracranial abnormality. There was evidence for chronic small vessel disease. There was also generalized appearing brain volume loss. EEG is in process. Neurology has evaluated the patient. Plan is for LP on the afternoon of 10/12/2023.   The family reports that the patient is more present today. He is making eye contact and tracking. He is trying to speak. Per family the patient is moving all extremities. I see this is the case while I am talking with the family.  Assessment and Plan: 89 y.o. male with medical history significant of a.fib on eliquis , complete  heart block sp pacemaker, DM2, HTN, HLD, CKD  Admitted for  Acute encephalopathy      Present on Admission:  Acute encephalopathy  Elevated troponin  Essential hypertension  Hyperlipidemia with target LDL less than 70  Paroxysmal atrial fibrillation (HCC)  Sinus node dysfunction (HCC)  Prolonged QT interval     Elevated troponin Troponin stable, could be in the setting of brain injury  Echo in am If abnormal would get cardiology consult Monitor on tele   Essential hypertension Allow permissive HTN   Hyperlipidemia with target LDL less than 70 Not able to tolerate PO statin at this time   Insulin -requiring or dependent type II diabetes mellitus (HCC) Glucoses have been 180-326 in the last 24 hours due to decadron . Will add lantus  20 units daily Will change from sensitive dose SSI to resistant dose SSI HbA1c was 8.3.   Paroxysmal atrial fibrillation (HCC) Hold eliquis  for tonight, pt unable to tolerate PO  Stroke suspected would not be a good candidate for heparin   Sinus node dysfunction (HCC) Sp pacemaker   Prolonged QT interval - will monitor on tele avoid QT prolonging medications, rehydrate correct electrolytes   Acute encephalopathy Concerning for significant brain injury Continuous EEG ordered MRI in a.m. ordered Rehydrate monitor for any reversible causes. Appreciate neurology consult discussed case at length with son At this point they wish for patient to be DNR and resuscitate DO NOT INTUBATE Family is in route Prognosis at this point appears to be  poor Will know more once results of MRI are back Palliative care consult PCCM is also aware at this point they feel that patient has no indication for ICU admission Concern for meningitis. The patient will be placed on empiric antibiotics with plans for LP on 10/12/2023.     I have seen and examined this patient myself. I have spent 36 minutes in his evaluation and care including time spent in coordination of  care with neurology and discussing patient with family at bedside.   DVT prophylaxis:  SCD      Code Status:  DNR/DNI  as per family  I had personally discussed CODE STATUS with  family  ACP   none    Family Communication:   Family  at  Bedside  plan of care was discussed. Diet  Pending swallow eval.      Subjective: The patient is clearly encephalopathic. His gaze is to the right, he has notable right facial droop. His right upper extremity has spastic paralysis. He is moving bilateral lower extremities. He is not verbal and is not able to follow commands or respond verbally.  Physical Exam: Vitals:   10/12/23 0403 10/12/23 0723 10/12/23 1237 10/12/23 1521  BP: 125/69 126/82 (!) 142/76 120/72  Pulse: 80 82 88 92  Resp: 16 16 17 16   Temp: 98.2 F (36.8 C) 98.3 F (36.8 C) 98.8 F (37.1 C) 98.8 F (37.1 C)  TempSrc: Axillary Axillary Axillary Axillary  SpO2: 96% 98% 99% 99%  Weight:       Exam:  Constitutional:  The patient is lying with his eyes open. He seems to have some eye contact and maybe tracking. No verbal communication and he is not following commands. Eyes:  pupils and irises appear normal Normal lids and conjunctivae ENMT:  Noted right facial droop. Neck:  neck appears normal, no masses, normal ROM, supple no thyromegaly Respiratory:  No increased work of breathing. No wheezes, rales, or rhonchi No tactile fremitus Cardiovascular:  Regular rate and rhythm No murmurs, ectopy, or gallups. No lateral PMI. No thrills. Abdomen:  Abdomen is soft, non-tender, non-distended No hernias, masses, or organomegaly Normoactive bowel sounds.  Musculoskeletal:  No cyanosis, clubbing, or edema Skin:  No rashes, lesions, ulcers palpation of skin: no induration or nodules Neurologic:  Right facial droop Non verbal, not following commands Spastic contracture of the right upper extremity Now moving lower extremity bilateral. Psychiatric:  Patient is unable to  cooperate with exam.   Data Reviewed:  CTA head and neck CT head  Family Communication: Son is at bedside.   Disposition: Status is: Inpatient Remains inpatient appropriate because: DDX includes posterior circulation CVA, Seizure disorder, and meningitis. The patient is at significant risk of further deterioration of organ function and/or death. Work up is pending including MRI and LP.  Planned Discharge Destination: TBD    Time spent: 36 minutes  Author: Shakeerah Gradel, DO 10/12/2023 5:15 PM  For on call review [email or web address].

## 2023-10-12 NOTE — Progress Notes (Signed)
 Pt is off to unit for CT Scan.

## 2023-10-12 NOTE — Evaluation (Signed)
 Speech Language Pathology Evaluation Patient Details Name: Connor Dixon MRN: [id number] DOB: [birth year] Today's Date: 10/12/2023 Time: 1141-1156 SLP Time Calculation (min) (ACUTE ONLY): 15 min  Problem List:  Patient Active Problem List   Diagnosis Date Noted   Acute encephalopathy 10/10/2023   Elevated troponin 10/10/2023   Prolonged QT interval 10/10/2023   CKD stage 4 due to type 2 diabetes mellitus (HCC) 09/06/2023   Seasonal allergic rhinitis due to pollen 09/06/2023   Post-traumatic osteoarthritis of left shoulder 01/05/2023   Paroxysmal atrial fibrillation (HCC) 09/03/2021   Diabetic nephropathy associated with type 2 diabetes mellitus (HCC) 06/09/2021   Vitamin D  deficiency disease 06/09/2021   Insulin -requiring or dependent type II diabetes mellitus (HCC) 06/09/2021   Encounter for general adult medical examination with abnormal findings 06/09/2020   Sinus node dysfunction (HCC) 02/13/2019   PPM-St.Jude 07/22/2010   Heart block AV complete (HCC) 07/13/2010   Diabetes mellitus with diabetic neuropathy (HCC) 08/06/2007   Hyperlipidemia with target LDL less than 70 08/06/2007   Morbid obesity (HCC) 08/06/2007   Essential hypertension 08/06/2007   Past Medical History:  Past Medical History:  Diagnosis Date   Acute on chronic diastolic congestive heart failure (HCC)    Arthus phenomenon    Atrioventricular block, complete (HCC)    Cardiac pacemaker in situ 07/22/2010   Chronic kidney disease, stage 3 (HCC)    Presumably from longstanding DM & HTN. Scr has gradually increased over the past 2 years (1.6 in 12/2010, 2 in 03/2011, and up to 2.1 in 01/2013)   DIABETES MELLITUS, TYPE I, ADULT ONSET 08/06/2007   DM (diabetes mellitus) (HCC)    adult onset    Essential hypertension, benign 08/06/2007   Very good BP control   History of second degree heart block    HLD (hyperlipidemia)    HYPERLIPIDEMIA 08/06/2007   Hypopotassemia    Morbid obesity (HCC)    Obesity    Other  and unspecified angina pectoris    typical   Type I (juvenile type) diabetes mellitus without mention of complication, not stated as uncontrolled    (04/25/13 OV with Dr. Irene Mannheim) = Poorly controlled & is working  w/Dr. Alphia Asa to improve this. Most recent Hgb A1c was 8.2% but Connor Dixon reports it was down to 7.6 last month.   Past Surgical History:  Past Surgical History:  Procedure Laterality Date   CATARACT EXTRACTION  8/08   [place]   PPM GENERATOR CHANGEOUT N/A 02/13/2019   Procedure: PPM GENERATOR CHANGEOUT;  Surgeon: Tammie Fall, MD;  Location: [facility];  Service: Cardiovascular;  Laterality: N/A;   PTVDP  12/11   PPM - St. Jude   TOTAL HIP ARTHROPLASTY  04/05/06   right   HPI:  Connor Dixon is a 89 y.o. male who presented with severe encephalopathy which is currently of unclear etiology. Per neurologist "he does appear to have flexion posturing in his upper extremities, so I continue to suspect acute ischemic stroke, however this is not definite and I was hoping the MRI would answer the question today. With continued diagnostic uncertainty, though my suspicion for meningitis is low. I would favor proceeding with lumbar puncture." MRI no intracranial abnormality.   Assessment / Plan / Recommendation Clinical Impression  Pt lives alone and sons report a recent "cognitive decline" in the areas of managing meds, finances, getting confused on directions to places. MRI negative for stroke and pt having LP today to rule out meningitis. Pt had sedating meds  for MRI however he was awake but drowsy. He was vocalizing in phrase/sentence level without intelligible words (mostly vowels) and was unable to repeat. He nodded once in response to his name but was unable to respond to any yes/no biographical questions or follow commands. Pt's attention and level of awareness significantly decreased from baseline however family reports this has improved as he was unresponsive on  admission. ST will continue to work with pt and continue diagnostic assessment in therapy.    SLP Assessment  SLP Recommendation/Assessment: Patient needs continued Speech Lanaguage Pathology Services SLP Visit Diagnosis: Cognitive communication deficit (R41.841)    Recommendations for follow up therapy are one component of a multi-disciplinary discharge planning process, led by the attending physician.  Recommendations may be updated based on patient status, additional functional criteria and insurance authorization.    Follow Up Recommendations   (TBD)    Assistance Recommended at Discharge  Frequent or constant Supervision/Assistance  Functional Status Assessment Patient has had a recent decline in their functional status and demonstrates the ability to make significant improvements in function in a reasonable and predictable amount of time.  Frequency and Duration min 2x/week  2 weeks      SLP Evaluation Cognition  Overall Cognitive Status: Impaired/Different from baseline Arousal/Alertness:  (awake, drowsy) Orientation Level:  (unintellilgible responsese, nodded head to name) Attention: Sustained Sustained Attention: Impaired Sustained Attention Impairment: Verbal basic Memory:  (TBA) Awareness: Impaired (will assess further) Awareness Impairment: Emergent impairment Problem Solving:  (will assess further) Safety/Judgment: Impaired       Comprehension  Auditory Comprehension Overall Auditory Comprehension: Impaired Yes/No Questions: Impaired Basic Biographical Questions: 0-25% accurate Commands: Impaired One Step Basic Commands: 0-24% accurate (0%) Visual Recognition/Discrimination Discrimination: Not tested Reading Comprehension Reading Status: Not tested    Expression Expression Primary Mode of Expression: Verbal Verbal Expression Overall Verbal Expression: Impaired Initiation: No impairment Automatic Speech:  (unable) Level of Generative/Spontaneous  Verbalization:  (trying to verbalize-no decipherable words) Repetition:  (NT) Naming: Not tested Pragmatics: Impairment Impairments: Eye contact Written Expression Written Expression: Not tested   Oral / Motor  Oral Motor/Sensory Function Overall Oral Motor/Sensory Function: Mild impairment Facial ROM: Reduced right Facial Symmetry: Abnormal symmetry right Facial Strength: Reduced right Motor Speech Overall Motor Speech: Impaired Respiration: Within functional limits Phonation: Low vocal intensity Resonance: Within functional limits Articulation: Impaired Level of Impairment: Word Intelligibility:  (no intelligible words) Motor Planning: Witnin functional limits Motor Speech Errors: Not applicable            [name] 10/12/2023, 12:18 PM

## 2023-10-12 NOTE — Progress Notes (Signed)
 NEUROLOGY CONSULT FOLLOW UP NOTE   Date of service: Oct 12, 2023 Patient Name: Connor Dixon MRN:  [id number] DOB:  [birth year]  Interval Hx/subjective   MRI is negative, the patient appears significantly more awake and alert Vitals   Vitals:   10/12/23 0723 10/12/23 1237 10/12/23 1521 10/12/23 1740  BP: 126/82 (!) 142/76 120/72 (!) 145/68  Pulse: 82 88 92 90  Resp: 16 17 16    Temp: 98.3 F (36.8 C) 98.8 F (37.1 C) 98.8 F (37.1 C)   TempSrc: Axillary Axillary Axillary   SpO2: 98% 99% 99%   Weight:         Body mass index is 30.5 kg/m.  Physical Exam   Constitutional: Appears elderly  Neurologic Examination    MS: Opens eyes spontaneously, makes attempts at verbalization, but does not follow commands. CN: Eyes are midline, he does appear to move his eyes across midline at times, but does not clearly fixate or track.  Pupils are reactive Motor: Increased tone seen previously in upper extremities is much better today, he has spontaneous movements against gravity bilaterally. Sensory: As above  Medications  Current Facility-Administered Medications:     stroke: early stages of recovery book, , Does not apply, Once, Doutova, Anastassia, MD   0.9 %  sodium chloride  infusion, , Intravenous, Continuous, Doutova, Anastassia, MD, Stopped at 10/11/23 1124   acetaminophen  (TYLENOL ) tablet 650 mg, 650 mg, Oral, Q4H PRN **OR** acetaminophen  (TYLENOL ) 160 MG/5ML solution 650 mg, 650 mg, Per Tube, Q4H PRN **OR** acetaminophen  (TYLENOL ) suppository 650 mg, 650 mg, Rectal, Q4H PRN, Doutova, Anastassia, MD   acyclovir (ZOVIRAX) 800 mg in dextrose  5 % 250 mL IVPB, 800 mg, Intravenous, Q24H, Paytes, Austin A, RPH, Last Rate: 266 mL/hr at 10/12/23 1800, Infusion Verify at 10/12/23 1800   ampicillin (OMNIPEN) 2 g in sodium chloride  0.9 % 100 mL IVPB, 2 g, Intravenous, Q8H, Swayze, Ava, DO, Stopped at 10/12/23 1705   cefTRIAXone (ROCEPHIN) 2 g in sodium chloride  0.9 % 100 mL IVPB, 2 g,  Intravenous, Q12H, Swayze, Ava, DO, Stopped at 10/12/23 1638   dexamethasone (DECADRON) injection 12 mg, 12 mg, Intravenous, Q6H, Swayze, Ava, DO, 12 mg at 10/12/23 1739   [START ON 10/13/2023] insulin  aspart (novoLOG) injection 0-20 Units, 0-20 Units, Subcutaneous, TID WC, Swayze, Ava, DO   insulin  glargine-yfgn (SEMGLEE ) injection 20 Units, 20 Units, Subcutaneous, QHS, Swayze, Ava, DO   vancomycin (VANCOCIN) IVPB 1000 mg/200 mL premix, 1,000 mg, Intravenous, Q24H, Corrin Dimes, [facility]  Labs and Diagnostic [facility]   Procalcitonin 0.11 pCO2 29 Ammonia less than 13 Creatinine 2.31 with a BUN of 41 TSH 1.002 CK is normal Overnight EEG is normal  Assessment   KEENE GILKEY is a 89 y.o. male who presented with severe encephalopathy which is currently of unclear etiology.  He is certainly improving, unclear if this is due to the antibiotics or simply natural history.  No evidence of seizure on prolonged EEG, no evidence of stroke on MRI.  Though he has not had fever or white count, without other clear explanation I would continue empiric treatment until we are able to get a lumbar puncture.  Recommendations  LP for protein, cells, glucose, cultures, meningitis encephalitis panel Empiric antibiotics including ampicillin, ceftriaxone, vancomycin, acyclovir Neurology will continue to follow. ______________________________________________________________________   Flint Hummer, MD [facility]

## 2023-10-12 NOTE — Progress Notes (Signed)
 Pt back to unit, alert, with spontaneous eye opening.

## 2023-10-12 NOTE — Inpatient Diabetes Management (Signed)
 Inpatient Diabetes Program Recommendations  AACE/ADA: New Consensus Statement on Inpatient Glycemic Control (2015)  Target Ranges:  Prepandial:   less than 140 mg/dL      Peak postprandial:   less than 180 mg/dL (1-2 hours)      Critically ill patients:  140 - 180 mg/dL   Lab Results  Component Value Date   GLUCAP 278 (H) 10/12/2023   HGBA1C 8.7 (H) 09/06/2023    Review of Glycemic Control  Latest Reference Range & Units 10/11/23 08:26 10/11/23 11:46 10/11/23 16:49 10/11/23 20:05 10/11/23 23:47 10/12/23 04:00 10/12/23 07:25  Glucose-Capillary 70 - 99 mg/dL 161 (H) 096 (H) 045 (H) 271 (H) 231 (H) 289 (H) 278 (H)   Diabetes history: Type 2 DM Outpatient Diabetes medications: Humulin  70/30- 40 units QA/ 25 units QP Current orders for Inpatient glycemic control:  Novolog 0-9 units Q4H Decadron 12 mg Q6 hours  Inpatient Diabetes Program Recommendations:    Consider adding Semglee  14 units every day  Thanks, Eloise Hake RN, MSN, BC-ADM Inpatient Diabetes Coordinator Team Pager [phone] (8a-5p)

## 2023-10-12 NOTE — Progress Notes (Signed)
 Palliative Care Progress Note, Assessment & Plan   Patient Name: Connor Dixon       Date: 10/12/2023 DOB: 07-12-1934  Age: 89 y.o. MRN#: [id number] Attending Physician: Junita Oliva, DO Primary Care Physician: Arcadio Knuckles, MD Admit Date: 10/10/2023  Subjective: Patient is lying in bed on his right side, sleeping, in no apparent distress.  He does not awaken to my presence.  Patient's son [name] is at bedside during my visit.  HPI: 89 y.o. male  with past medical history of A-fib (Eliquis ), s/p PPM for second-degree heart block, HTN, CKD (stage III), type 1 diabetes, morbid obesity, and HLD admitted on 10/10/2023 after being found down at home.  Last known well was 5/26 at 1700.   EGD revealed moderate diffuse encephalopathy with no seizures or epileptiform discharges noted.  Overnight EEG results pending.  Pending.  Workup remains in progress.  CT of the head revealed remote lacunar infarct in the anterior right corona radiata.  CTA revealed no evidence of significant stenosis, aneurysmal dilatation, or dissection involving the arteries of the head and neck.   5/29 - MRI of brain revealed no acute intracranial abnormality, signal changes compatible with chronic small vessel disease, and generalized appearing brain volume loss.  LP pending.    PMT was consulted to support patient and family with goals of care discussions.  Summary of counseling/coordination of care: Extensive chart review completed prior to meeting patient including labs, vital signs, imaging, progress notes, orders, and available advanced directive documents from current and previous encounters.   After reviewing the patient's chart and assessing the patient at bedside, I spoke with patient's son in regards to symptom management and  goals of care.   [name] endorses that patient has been resting since he was sedated for MRI earlier today.  Discussed that should MRI not reveal any etiology of altered mental status, plan is for lumbar puncture today.  Education provided on LP, meningitis, and medications that cross the blood-brain barrier.  Opportunity and space provided for [name] to voice his concern and asked questions.  Therapeutic silence, active listening, and emotional support provided.  No change to goals at this time.  DNR with limited interventions remains.  Awaiting LP before planning next steps/setting further boundaries of care.   Physical Exam Vitals reviewed.  Constitutional:      General: He is not in acute distress.    Appearance: He is normal weight.  HENT:     Head: Normocephalic.     Nose: Nose normal.  Cardiovascular:     Rate and Rhythm: Normal rate.  Pulmonary:     Effort: Pulmonary effort is normal.  Abdominal:     Palpations: Abdomen is soft.  Skin:    General: Skin is warm and dry.  Psychiatric:        Mood and Affect: Mood normal.        Behavior: Behavior normal.             Total Time 25 minutes   Time spent includes: Detailed review of medical records (labs, imaging, vital signs), medically appropriate exam (mental status, respiratory, cardiac, skin), discussed with treatment team, counseling and educating patient, family and staff, documenting  clinical information, medication management and coordination of care.  Judeen Nose L. Rebbeca Campi, DNP, FNP-BC Palliative Medicine Team

## 2023-10-12 NOTE — Procedures (Signed)
 Indication: Altered mental status  Risks of the procedure were dicussed with the patient including post-LP headache, bleeding, infection, weakness/numbness of legs(radiculopathy), death.  The patient/patient's proxy agreed and written consent was obtained.   The patient was prepped and draped, and using sterile technique a 20 gauge quinke spinal needle was inserted in the L3-4 space.  After multiple attempts, bony resistance was repeatedly met.  The patient initially tolerated it well, but by the time of cessation, he was no longer tolerating the repeated attempts and therefore is decided to stop the procedure.  I think doing it under fluoroscopy, the patient will build to tolerate it much better as he did tolerate initial attempts at bedside.  Ann Keto, MD [facility]   If 7pm- 7am, please page neurology on call as listed in AMION.

## 2023-10-12 NOTE — Progress Notes (Signed)
 PHARMACY NOTE:  ANTIMICROBIAL RENAL DOSAGE ADJUSTMENT  Current antimicrobial regimen includes a mismatch between antimicrobial dosage and estimated renal function.  As per policy approved by the Pharmacy & Therapeutics and Medical Executive Committees, the antimicrobial dosage will be adjusted accordingly.  Current antimicrobial dosage:  acyclovir 900 mg IV Q24H   Indication: meningitis/encephalitis r/o   Renal Function:  Estimated Creatinine Clearance: 23.7 mL/min (A) (by C-G formula based on SCr of 2.45 mg/dL (H)). []      On intermittent HD, scheduled: []      On CRRT    Antimicrobial dosage has been changed to:  acyclovir 800 mg IV Q24H (based on adjusted bodyweight 77 kg)   Additional comments:   Thank you for allowing pharmacy to be a part of this patient's care.  Chrystie Crass, PharmD Clinical Pharmacist  10/12/2023 7:18 AM

## 2023-10-12 NOTE — Progress Notes (Signed)
 PT Cancellation Note  Patient Details Name: Connor Dixon MRN: [id number] DOB: Feb 10, 1935   Cancelled Treatment:    Reason Eval/Treat Not Completed: Medical issues which prohibited therapy;Other (comment) (Checked in with MD and requested therapy hold at this time due to pt being scheduled for LP today. Will check back tomorrow.)   [name] 10/12/2023, 9:31 AM

## 2023-10-12 NOTE — Plan of Care (Signed)
  Problem: Education: Goal: Ability to describe self-care measures that may prevent or decrease complications (Diabetes Survival Skills Education) will improve Outcome: Progressing   Problem: Health Behavior/Discharge Planning: Goal: Ability to manage health-related needs will improve Outcome: Progressing   Problem: Skin Integrity: Goal: Risk for impaired skin integrity will decrease Outcome: Progressing   Problem: Ischemic Stroke/TIA Tissue Perfusion: Goal: Complications of ischemic stroke/TIA will be minimized Outcome: Progressing   Problem: Self-Care: Goal: Verbalization of feelings and concerns over difficulty with self-care will improve Outcome: Progressing   Problem: Self-Care: Goal: Ability to participate in self-care as condition permits will improve Outcome: Progressing   Problem: Clinical Measurements: Goal: Will remain free from infection Outcome: Progressing   Problem: Activity: Goal: Risk for activity intolerance will decrease Outcome: Progressing   Problem: Safety: Goal: Ability to remain free from injury will improve Outcome: Progressing   Problem: Skin Integrity: Goal: Risk for impaired skin integrity will decrease Outcome: Progressing

## 2023-10-12 NOTE — Progress Notes (Signed)
 OT Cancellation Note  Patient Details Name: Connor Dixon MRN: [id number] DOB: 1935-03-01   Cancelled Treatment:    Reason Eval/Treat Not Completed: Medical issues which prohibited therapy (Checked in with MD and they requested therapy hold at this time due to pt being scheduled for LP today. Will check back tomorrow.)  10/12/2023  AB, OTR/L  Acute Rehabilitation Services  Office: [phone]   Jorene New 10/12/2023, 10:25 AM

## 2023-10-13 ENCOUNTER — Inpatient Hospital Stay (HOSPITAL_COMMUNITY)

## 2023-10-13 DIAGNOSIS — I480 Paroxysmal atrial fibrillation: Secondary | ICD-10-CM | POA: Diagnosis not present

## 2023-10-13 DIAGNOSIS — I495 Sick sinus syndrome: Secondary | ICD-10-CM | POA: Diagnosis not present

## 2023-10-13 DIAGNOSIS — G9340 Encephalopathy, unspecified: Secondary | ICD-10-CM | POA: Diagnosis not present

## 2023-10-13 DIAGNOSIS — R7989 Other specified abnormal findings of blood chemistry: Secondary | ICD-10-CM | POA: Diagnosis not present

## 2023-10-13 LAB — CSF CELL COUNT WITH DIFFERENTIAL
RBC Count, CSF: 78 /mm3 — ABNORMAL HIGH
RBC Count, CSF: 47 /mm3 — ABNORMAL HIGH
Tube #: 1
Tube #: 4
WBC, CSF: 3 /mm3 (ref 0–5)
WBC, CSF: 2 /mm3 (ref 0–5)

## 2023-10-13 LAB — MENINGITIS/ENCEPHALITIS PANEL (CSF)

## 2023-10-13 LAB — CBC WITH DIFFERENTIAL/PLATELET
Abs Immature Granulocytes: 0.14 10*3/uL — ABNORMAL HIGH (ref 0.00–0.07)
Basophils Absolute: 0 10*3/uL (ref 0.0–0.1)
Basophils Relative: 0 %
Eosinophils Absolute: 0 10*3/uL (ref 0.0–0.5)
Eosinophils Relative: 0 %
HCT: 44 % (ref 39.0–52.0)
Hemoglobin: 14.9 g/dL (ref 13.0–17.0)
Immature Granulocytes: 1 %
Lymphocytes Relative: 3 %
Lymphs Abs: 0.5 10*3/uL — ABNORMAL LOW (ref 0.7–4.0)
MCH: 31 pg (ref 26.0–34.0)
MCHC: 33.9 g/dL (ref 30.0–36.0)
MCV: 91.7 fL (ref 80.0–100.0)
Monocytes Absolute: 0.8 10*3/uL (ref 0.1–1.0)
Monocytes Relative: 6 %
Neutro Abs: 12.6 10*3/uL — ABNORMAL HIGH (ref 1.7–7.7)
Neutrophils Relative %: 90 %
Platelets: 216 10*3/uL (ref 150–400)
RBC: 4.8 MIL/uL (ref 4.22–5.81)
RDW: 14.5 % (ref 11.5–15.5)
WBC: 14 10*3/uL — ABNORMAL HIGH (ref 4.0–10.5)
nRBC: 0 % (ref 0.0–0.2)

## 2023-10-13 LAB — BASIC METABOLIC PANEL WITH GFR
Anion gap: 12 (ref 5–15)
BUN: 59 mg/dL — ABNORMAL HIGH (ref 8–23)
CO2: 19 mmol/L — ABNORMAL LOW (ref 22–32)
Calcium: 8.9 mg/dL (ref 8.9–10.3)
Chloride: 115 mmol/L — ABNORMAL HIGH (ref 98–111)
Creatinine, Ser: 2.33 mg/dL — ABNORMAL HIGH (ref 0.61–1.24)
GFR, Estimated: 27 mL/min — ABNORMAL LOW (ref >60)
Glucose, Bld: 267 mg/dL — ABNORMAL HIGH (ref 70–99)
Potassium: 3.7 mmol/L (ref 3.5–5.1)
Sodium: 146 mmol/L — ABNORMAL HIGH (ref 135–145)

## 2023-10-13 LAB — GLUCOSE, CAPILLARY
Glucose-Capillary: 104 mg/dL — ABNORMAL HIGH (ref 70–99)
Glucose-Capillary: 112 mg/dL — ABNORMAL HIGH (ref 70–99)
Glucose-Capillary: 212 mg/dL — ABNORMAL HIGH (ref 70–99)
Glucose-Capillary: 286 mg/dL — ABNORMAL HIGH (ref 70–99)
Glucose-Capillary: 289 mg/dL — ABNORMAL HIGH (ref 70–99)
Glucose-Capillary: 300 mg/dL — ABNORMAL HIGH (ref 70–99)
Glucose-Capillary: 361 mg/dL — ABNORMAL HIGH (ref 70–99)
Glucose-Capillary: 67 mg/dL — ABNORMAL LOW (ref 70–99)
Glucose-Capillary: 94 mg/dL (ref 70–99)

## 2023-10-13 LAB — HIV ANTIBODY (ROUTINE TESTING W REFLEX): HIV Screen 4th Generation wRfx: NONREACTIVE

## 2023-10-13 LAB — PROTEIN AND GLUCOSE, CSF
Glucose, CSF: 191 mg/dL — ABNORMAL HIGH (ref 40–70)
Total  Protein, CSF: 57 mg/dL — ABNORMAL HIGH (ref 15–45)

## 2023-10-13 LAB — RESP PANEL BY RT-PCR (RSV, FLU A&B, COVID)  RVPGX2
Influenza A by PCR: NEGATIVE
Influenza B by PCR: NEGATIVE
Resp Syncytial Virus by PCR: NEGATIVE
SARS Coronavirus 2 by RT PCR: NEGATIVE

## 2023-10-13 LAB — SEDIMENTATION RATE: Sed Rate: 30 mm/h — ABNORMAL HIGH (ref 0–16)

## 2023-10-13 LAB — C-REACTIVE PROTEIN: CRP: 8.9 mg/dL — ABNORMAL HIGH (ref <1.0)

## 2023-10-13 MED ORDER — LIDOCAINE 1 % OPTIME INJ - NO CHARGE
5.0000 mL | Freq: Once | INTRAMUSCULAR | Status: AC
  Administered 2023-10-13: 5 mL via INTRADERMAL

## 2023-10-13 MED ORDER — SODIUM CHLORIDE 0.45 % IV SOLN: INTRAVENOUS | Status: AC

## 2023-10-13 MED ORDER — DEXAMETHASONE SODIUM PHOSPHATE 10 MG/ML IJ SOLN
12.0000 mg | Freq: Four times a day (QID) | INTRAMUSCULAR | Status: DC
  Administered 2023-10-13 – 2023-10-14 (×3): 12 mg via INTRAVENOUS
  Filled 2023-10-13 (×2): qty 2

## 2023-10-13 MED ORDER — LIDOCAINE HCL (PF) 1 % IJ SOLN
10.0000 mL | Freq: Once | INTRAMUSCULAR | Status: AC
Start: 2023-10-13 — End: 2023-10-13
  Administered 2023-10-13: 10 mL

## 2023-10-13 MED ORDER — DEXTROSE 50 % IV SOLN
12.5000 g | INTRAVENOUS | Status: AC
  Administered 2023-10-13: 12.5 g via INTRAVENOUS
  Filled 2023-10-13: qty 50

## 2023-10-13 NOTE — Evaluation (Signed)
 Occupational Therapy Evaluation Patient Details Name: Connor Dixon MRN: [id number] DOB: 22-Feb-1935 Today's Date: 10/13/2023   History of Present Illness   Pt is 89 year old presented to [facility] on  10/10/23 after found at home unresponsive. Pt with dense encephalopathy with flexor positioning of UE's. CT negative for acute changes and MRI pending.  PMH - afib on Eliquis , pacer, HTN, CKD, DM, obesity     Clinical Impressions Pt admitted for above, PTA pt family reports that he was independent in ADLs and driving, ambulating no AD. Pt currently presenting with impaired ability to follow commands, initiate and attend to tasks. Pt needing total A to max A for ADLs, impaired sequencing and motor planning, able to stand with Max A +2 but unable to offload without therapists facilitating. OT to continue following pt acutely to address listed deficits and help transition to next level of care. Patient will benefit from continued inpatient follow up therapy, <3 hours/day      If plan is discharge home, recommend the following:   Two people to help with walking and/or transfers;Two people to help with bathing/dressing/bathroom;Direct supervision/assist for medications management;Direct supervision/assist for financial management;Supervision due to cognitive status;Assist for transportation;Assistance with cooking/housework;Help with stairs or ramp for entrance     Functional Status Assessment   Patient has had a recent decline in their functional status and demonstrates the ability to make significant improvements in function in a reasonable and predictable amount of time.     Equipment Recommendations   Other (comment) (defer)     Recommendations for Other Services         Precautions/Restrictions   Precautions Precautions: Fall Recall of Precautions/Restrictions: Impaired Restrictions Weight Bearing Restrictions Per Provider Order: No     Mobility Bed Mobility Overal bed  mobility: Needs Assistance Bed Mobility: Supine to Sit, Sit to Supine     Supine to sit: Max assist, Used rails Sit to supine: Max assist, +2 for physical assistance   General bed mobility comments: therapy assisting with pt hand placement using rails    Transfers Overall transfer level: Needs assistance Equipment used: 2 person hand held assist Transfers: Sit to/from Stand Sit to Stand: Max assist, +2 physical assistance, +2 safety/equipment           General transfer comment: Max A+2 for lateral steps at bedside, therapists assisting with repositoining of BLEs and weight shifting.      Balance Overall balance assessment: Needs assistance Sitting-balance support: Feet supported Sitting balance-Leahy Scale: Fair Sitting balance - Comments: min A to CGA sitting EOB, intermittenent R LOB Postural control: Right lateral lean Standing balance support: Bilateral upper extremity supported Standing balance-Leahy Scale: Zero Standing balance comment: Max A +2                           ADL either performed or assessed with clinical judgement   ADL Overall ADL's : Needs assistance/impaired Eating/Feeding: Total assistance;Bed level   Grooming: Maximal assistance;Sitting;Wash/dry face Grooming Details (indicate cue type and reason): hand over hand assist cue to have pt wash face with RUE. After repetition pt using RUE to wash face without OT assist. Upper Body Bathing: Maximal assistance;Bed level   Lower Body Bathing: Total assistance   Upper Body Dressing : Total assistance;Sitting   Lower Body Dressing: Total assistance;Sitting/lateral leans Lower Body Dressing Details (indicate cue type and reason): pt not able to inititate task Toilet Transfer: Maximal assistance;+2 for safety/equipment;+2 for physical assistance Toilet  Transfer Details (indicate cue type and reason): could use stedy Toileting- Clothing Manipulation and Hygiene: Total assistance;+2 for  safety/equipment;+2 for physical assistance               Vision   Additional Comments: unable to assess, impaired cognition, command following, and attention     Perception Perception: Not tested       Praxis Praxis: Impaired Praxis Impairment Details: Initiation, Ideation, Motor planning, Organization     Pertinent Vitals/Pain Pain Assessment Pain Assessment: PAINAD Breathing: normal Negative Vocalization: none Facial Expression: smiling or inexpressive Body Language: relaxed Consolability: no need to console PAINAD Score: 0     Extremity/Trunk Assessment Upper Extremity Assessment Upper Extremity Assessment: Difficult to assess due to impaired cognition (Pt using BUEs to push, pull, and reach. Using them spontaneously)   Lower Extremity Assessment Lower Extremity Assessment: Defer to PT evaluation       Communication Communication Communication: Impaired Factors Affecting Communication: Difficulty expressing self   Cognition Arousal: Alert Behavior During Therapy: Restless Cognition: Cognition impaired       Memory impairment (select all impairments): Working [employer] fast streamer, Conservation officer, historic buildings, Non-declarative long-term memory, Short-term memory Attention impairment (select first level of impairment): Focused attention, Sustained attention Executive functioning impairment (select all impairments): Initiation, Sequencing, Reasoning, Organization, Problem solving OT - Cognition Comments: Pt mouthing some words but hard to make out, following <20% of commands.                 Following commands: Impaired Following commands impaired: Follows one step commands with increased time, Only follows one step commands consistently     Cueing  General Comments   Cueing Techniques: Verbal cues;Gestural cues;Tactile cues;Visual cues  pt family present and supportive.   Exercises     Shoulder Instructions      Home Living Family/patient expects to be  discharged to:: Skilled nursing facility                                        Prior Functioning/Environment                      OT Problem List: Decreased cognition;Impaired balance (sitting and/or standing);Decreased safety awareness   OT Treatment/Interventions: Therapeutic activities;Therapeutic exercise;Patient/family education;DME and/or AE instruction;Balance training      OT Goals(Current goals can be found in the care plan section)   Acute Rehab OT Goals Patient Stated Goal: Go to rehab and get better OT Goal Formulation: With family Time For Goal Achievement: 10/27/23 Potential to Achieve Goals: Fair ADL Goals Pt Will Perform Grooming: with set-up;sitting Pt Will Perform Upper Body Dressing: sitting;with set-up;with supervision Pt Will Perform Lower Body Dressing: sit to/from stand;with min assist Pt Will Transfer to Toilet: stand pivot transfer;with min assist;with +2 assist;bedside commode Additional ADL Goal #2: Pt will follow (three) simple 1 step commands to engage in ADLs   OT Frequency:  Min 2X/week    Co-evaluation PT/OT/SLP Co-Evaluation/Treatment: Yes Reason for Co-Treatment: Complexity of the patient's impairments (multi-system involvement);Necessary to address cognition/behavior during functional activity PT goals addressed during session: Mobility/safety with mobility;Balance OT goals addressed during session: ADL's and self-care      AM-PAC OT "6 Clicks" Daily Activity     Outcome Measure Help from another person eating meals?: A Lot Help from another person taking care of personal grooming?: A Lot Help from another person toileting, which includes  using toliet, bedpan, or urinal?: A Lot Help from another person bathing (including washing, rinsing, drying)?: A Lot Help from another person to put on and taking off regular upper body clothing?: A Lot Help from another person to put on and taking off regular lower body  clothing?: Total 6 Click Score: 11   End of Session Equipment Utilized During Treatment: Gait belt Nurse Communication: Mobility status  Activity Tolerance: Patient tolerated treatment well Patient left: in bed;with call bell/phone within reach;with bed alarm set;with family/visitor present  OT Visit Diagnosis: Other abnormalities of gait and mobility (R26.89);Unsteadiness on feet (R26.81);Other symptoms and signs involving cognitive function                Time: 1610-9604 OT Time Calculation (min): 34 min Charges:  OT General Charges $OT Visit: 1 Visit OT Evaluation $OT Eval Moderate Complexity: 1 Mod  10/13/2023  AB, OTR/L  Acute Rehabilitation Services  Office: [phone]   Jorene New 10/13/2023, 4:09 PM

## 2023-10-13 NOTE — Inpatient Diabetes Management (Signed)
 Inpatient Diabetes Program Recommendations  AACE/ADA: New Consensus Statement on Inpatient Glycemic Control (2015)  Target Ranges:  Prepandial:   less than 140 mg/dL      Peak postprandial:   less than 180 mg/dL (1-2 hours)      Critically ill patients:  140 - 180 mg/dL   Lab Results  Component Value Date   GLUCAP 289 (H) 10/13/2023   HGBA1C 8.7 (H) 09/06/2023    Review of Glycemic Control  Latest Reference Range & Units 10/12/23 07:25 10/12/23 12:41 10/12/23 15:24 10/12/23 20:35 10/13/23 00:05 10/13/23 04:09 10/13/23 07:29  Glucose-Capillary 70 - 99 mg/dL 409 (H) 811 (H) 914 (H) 324 (H) 361 (H) 286 (H) 289 (H)   Diabetes history: Type 2 DM Outpatient Diabetes medications: Humulin  70/30- 40 units QA/ 25 units QP Current orders for Inpatient glycemic control:  Semglee  20 units qhs Novolog 0-20 units Q4H Decadron 12 mg Q6 hours  Inpatient Diabetes Program Recommendations:    -   Increase Semglee  to 30 units  Thanks, Eloise Hake RN, MSN, BC-ADM Inpatient Diabetes Coordinator Team Pager [phone] (8a-5p)

## 2023-10-13 NOTE — Progress Notes (Signed)
 Palliative Care Progress Note, Assessment & Plan   Patient Name: Connor Dixon       Date: 10/13/2023 DOB: 1934/07/29  Age: 89 y.o. MRN#: [id number] Attending Physician: Junita Oliva, DO Primary Care Physician: Arcadio Knuckles, MD Admit Date: 10/10/2023  Subjective: Patient is lying in bed, awake and alert.  He makes eye contact and tracks me across the room.  He makes no clear vocalizations as I greet him.  His son [name] is at bedside during my visit.  HPI: 89 y.o. male  with past medical history of A-fib (Eliquis ), s/p PPM for second-degree heart block, HTN, CKD (stage III), type 1 diabetes, morbid obesity, and HLD admitted on 10/10/2023 after being found down at home.  Last known well was 5/26 at 1700.   EGD revealed moderate diffuse encephalopathy with no seizures or epileptiform discharges noted.  Overnight EEG results pending.  Pending.  Workup remains in progress.  CT of the head revealed remote lacunar infarct in the anterior right corona radiata.  CTA revealed no evidence of significant stenosis, aneurysmal dilatation, or dissection involving the arteries of the head and neck. MRI WNL. LP to be performed today.    PMT was consulted to support patient and family with goals of care discussions.  Summary of counseling/coordination of care: Extensive chart review completed prior to meeting patient including labs, vital signs, imaging, progress notes, orders, and available advanced directive documents from current and previous encounters.   After reviewing the patient's chart and assessing the patient at bedside, I spoke with patient in regards to symptom management and goals of care.   Patient is unable to participate in symptom assessment.  Son at bedside endorses patient has been fidgeting and  attempting to roll to his right side all day today.  Son shares patient will look at him but feels as though his father is looking past him or through him.  Displaced opportunity provided for [name] to share his thoughts and emotions regarding his father's current medical situation.  During becomes tearful.  Therapeutic silence, active listening, and emotional support provided.  Darren recalls seeing similarities in his father that he saw as his mother was passing in 2018 from pancreatic cancer.  Education provided on envisioning and signs that transitions to end-of-life are coming before signs of actively dying appear.  Reviewed the patient's MRI was within normal limits and stroke workup has revealed no signs of acute stroke.  Discussed that lumbar puncture scheduled for today to hopefully reveal more etiology of patient's altered mental status.  Discussed that in addition to investigating cause of patient's altered mental status, it is important to analyze his current functional, nutritional, and cognitive status as compared to his previous baseline.  Darrian shares this is a complete shock and "baffling".  He shares that he remains in agreement with his father's previous wishes to not give any life-prolonging or artificial measures.  However, Darren and his brother continue to remain interested in treating the treatable and trying to understand what caused this drastic change in their father.  No adjustment to plan of care at this time.  Awaiting results of LP to further discuss prognosis and plan.  Physical Exam Vitals reviewed.  Constitutional:      General: He is not in acute distress.    Appearance: He is normal weight.  HENT:     Head: Normocephalic.     Mouth/Throat:     Mouth: Mucous membranes are moist.  Pulmonary:     Effort: Pulmonary effort is normal.  Abdominal:     Palpations: Abdomen is soft.  Skin:    General: Skin is warm and dry.  Neurological:     Mental Status: He is  alert.  Psychiatric:     Comments: fidgeting             Total Time 35 minutes   Time spent includes: Detailed review of medical records (labs, imaging, vital signs), medically appropriate exam (mental status, respiratory, cardiac, skin), discussed with treatment team, counseling and educating patient, family and staff, documenting clinical information, medication management and coordination of care.  Judeen Nose L. Rebbeca Campi, DNP, FNP-BC Palliative Medicine Team

## 2023-10-13 NOTE — Progress Notes (Signed)
 Progress Note   Patient: Connor Dixon QMV:[id number] DOB: [birth year] DOA: 10/10/2023     3 DOS: the patient was seen and examined on 10/13/2023   Brief hospital course: The patient is a 89 yr old man who was brought to the ED via EMS after he was found slumped over in his chair to the right and unresponsive by his Goddaughter who comes over every Tuesday to check on him and help him with the house. Prior to this the last time that the patient was "normal" was in a conversation with his son who lives in [place]  at 1700 on 10/09/2023. He apparently had not taken any of his medications on the evening of 5/26 and had soiled the chair.   The patient's past medical history includes: CHF - chronic Arthus phenomenon Complete AV block, s/p PCM CKD 3b DM I HTN Hyperlipidemia Morbid obesity  In the ED the patient underwent a CT head that demonstrated no acute process. A CTA head and neck demonstrated no LVO. There was however evidence of a remote lacunar infarct of the anterior right corona radiata. I have discussed the patient with Dr. Alecia Ames. There is concern for Seizure, Posterior Circulation CVA, and meningitis. The patient will need to be off of anticoagulation for at least 48 hours. LP is planned for 10/12/2023. The patient will be started on empiric antibiotic coverage for meningitis.   MRI was performed and demonstrated no acute intracranial abnormality. There was evidence for chronic small vessel disease. There was also generalized appearing brain volume loss. EEG is in process. Neurology has evaluated the patient. Plan is for LP on the afternoon of 10/12/2023.   On 10/13/2023 the patient again appears completely obtunded. This may be due to ativan given for MRI on 10/12/2023, but in any case the patient is not as alert as her was on 10/12/2023.   MRI brain has ruled out CVA.  LP performed on 10/13/2023 has ruled out meningitis. Have added on VDRL of CSF onto studies for the CSF. Antibiotics  for meningitis stopped as per neurology. However, steroids have been continued in case there is an autoimmune component to the patient's illness.  DDx continues to include autoimmune diseases and tertiary syphillis. HIV testing has also been ordered.  Assessment and Plan: 89 y.o. male with medical history significant of a.fib on eliquis , complete heart block sp pacemaker, DM2, HTN, HLD, CKD  Admitted for  Acute encephalopathy      Present on Admission:  Acute encephalopathy  Elevated troponin  Essential hypertension  Hyperlipidemia with target LDL less than 70  Paroxysmal atrial fibrillation (HCC)  Sinus node dysfunction (HCC)  Prolonged QT interval     Elevated troponin Troponin stable, could be in the setting of brain injury  Echo in am If abnormal would get cardiology consult Monitor on tele   Essential hypertension Allow permissive HTN   Hyperlipidemia with target LDL less than 70 Not able to tolerate PO statin at this time   Insulin -requiring or dependent type II diabetes mellitus (HCC) Glucoses have been 180-326 in the last 24 hours due to decadron. Will add lantus  20 units daily Will change from sensitive dose SSI to resistant dose SSI HbA1c was 8.3.   Paroxysmal atrial fibrillation (HCC) Hold eliquis  for tonight, pt unable to tolerate PO  Stroke suspected would not be a good candidate for heparin   Sinus node dysfunction (HCC) Sp pacemaker   Prolonged QT interval - will monitor on tele avoid QT prolonging medications, rehydrate correct  electrolytes   Acute encephalopathy Concerning for significant brain injury Continuous EEG ordered MRI in a.m. ordered Rehydrate monitor for any reversible causes. Appreciate neurology consult discussed case at length with son At this point they wish for patient to be DNR and resuscitate DO NOT INTUBATE Family is in route Prognosis at this point appears to be poor Will know more once results of MRI are back Palliative  care consult PCCM is also aware at this point they feel that patient has no indication for ICU admission On 10/13/2023 the patient again appears completely obtunded. This may be due to ativan given for MRI on 10/12/2023, but in any case the patient is not as alert as her was on 10/12/2023.  MRI brain has ruled out CVA. LP performed on 10/13/2023 has ruled out meningitis. Have added on VDRL of CSF onto studies for the CSF. Antibiotics for meningitis stopped as per neurology. However, steroids have been continued in case there is an autoimmune component to the patient's illness. DDx continues to include autoimmune diseases and tertiary syphillis. HIV testing has also been ordered.     I have seen and examined this patient myself. I have spent 42 minutes in his evaluation and care including time spent in coordination of care with neurology and discussing patient with family at bedside.   DVT prophylaxis:  SCD      Code Status:  DNR/DNI  as per family  I had personally discussed CODE STATUS with  family  ACP   none    Family Communication:   Family  at  Bedside  plan of care was discussed. Diet  Pending swallow eval.      Subjective: The patient is clearly encephalopathic. His gaze is to the right, he has notable right facial droop. His right upper extremity has spastic paralysis. He is moving bilateral lower extremities. He is not verbal and is not able to follow commands or respond verbally.  Physical Exam: Vitals:   10/13/23 0406 10/13/23 0725 10/13/23 1128 10/13/23 1610  BP: (!) 149/100 (!) 152/82  (!) 159/104  Pulse: 81 84 80 74  Resp: 17 16 17    Temp: 98 F (36.7 C) 97.9 F (36.6 C) (!) 97.5 F (36.4 C) 98.3 F (36.8 C)  TempSrc: Axillary Axillary Axillary Oral  SpO2: 99% 100% 100% 99%  Weight:       Exam:  Constitutional:  The patient is obtunded. He kept his eyes open for a minute after I opened them to examine his pupils. No eye contact. No tracking.  Eyes:  pupils and  irises appear normal Normal lids and conjunctivae ENMT:  Noted right facial droop. Neck:  neck appears normal, no masses, normal ROM, supple no thyromegaly Respiratory:  No increased work of breathing. No wheezes, rales, or rhonchi No tactile fremitus Cardiovascular:  Regular rate and rhythm No murmurs, ectopy, or gallups. No lateral PMI. No thrills. Abdomen:  Abdomen is soft, non-tender, non-distended No hernias, masses, or organomegaly Normoactive bowel sounds.  Musculoskeletal:  No cyanosis, clubbing, or edema Skin:  No rashes, lesions, ulcers palpation of skin: no induration or nodules Neurologic:  Right facial droop Non verbal, not following commands Spastic contracture of the right upper extremity Now moving lower extremity bilateral. Appears obtunded. Psychiatric:  Patient is unable to cooperate with exam.   Data Reviewed:  CTA head and neck CT head  Family Communication: Son is at bedside.   Disposition: Status is: Inpatient Remains inpatient appropriate because: DDX includes posterior circulation CVA, Seizure  disorder, and meningitis. The patient is at significant risk of further deterioration of organ function and/or death. Work up is pending including MRI and LP.  Planned Discharge Destination: TBD    Time spent: 36 minutes  Author: Iyonnah Ferrante, DO 10/13/2023 7:11 PM  For on call review [email or web address].

## 2023-10-13 NOTE — Progress Notes (Signed)
 NEUROLOGY CONSULT FOLLOW UP NOTE   Date of service: Oct 13, 2023 Patient Name: Connor Dixon MRN:  [id number] DOB:  01-29-1935  Interval Hx/subjective   He appears to continue to improve to me, family thinks he may be slightly less verbal than yesterday.  Vitals   Vitals:   10/13/23 0406 10/13/23 0725 10/13/23 1128 10/13/23 1610  BP: (!) 149/100 (!) 152/82  (!) 159/104  Pulse: 81 84 80 74  Resp: 17 16 17    Temp: 98 F (36.7 C) 97.9 F (36.6 C) (!) 97.5 F (36.4 C) 98.3 F (36.8 C)  TempSrc: Axillary Axillary Axillary Oral  SpO2: 99% 100% 100% 99%  Weight:         Body mass index is 30.5 kg/m.  Physical Exam   Constitutional: Appears elderly  Neurologic Examination    MS: Opens eyes spontaneously, he does appear to follow some simple commands today, but only with physical prompting, speech is not understandable. CN: Eyes are midline, he clearly fixates and tracks across midline in both directions, I question a mild right preference, but he clearly crosses midline today.  Pupils are reactive Motor: He moves all extremities spontaneously Sensory: As above  Medications  Current Facility-Administered Medications:     stroke: early stages of recovery book, , Does not apply, Once, Doutova, Anastassia, MD   0.9 %  sodium chloride  infusion, , Intravenous, Continuous, Doutova, Anastassia, MD, Last Rate: 75 mL/hr at 10/12/23 2128, New Bag at 10/12/23 2128   acetaminophen  (TYLENOL ) tablet 650 mg, 650 mg, Oral, Q4H PRN, 650 mg at 10/13/23 0555 **OR** acetaminophen  (TYLENOL ) 160 MG/5ML solution 650 mg, 650 mg, Per Tube, Q4H PRN **OR** acetaminophen  (TYLENOL ) suppository 650 mg, 650 mg, Rectal, Q4H PRN, Doutova, Anastassia, MD   acyclovir (ZOVIRAX) 800 mg in dextrose  5 % 250 mL IVPB, 800 mg, Intravenous, Q24H, Paytes, Austin A, RPH, Last Rate: 266 mL/hr at 10/12/23 1800, Infusion Verify at 10/12/23 1800   ampicillin (OMNIPEN) 2 g in sodium chloride  0.9 % 100 mL IVPB, 2 g,  Intravenous, Q8H, Swayze, Ava, DO, Last Rate: 300 mL/hr at 10/13/23 1035, 2 g at 10/13/23 1035   cefTRIAXone (ROCEPHIN) 2 g in sodium chloride  0.9 % 100 mL IVPB, 2 g, Intravenous, Q12H, Swayze, Ava, DO, Last Rate: 200 mL/hr at 10/13/23 0547, 2 g at 10/13/23 0547   Chlorhexidine  Gluconate Cloth 2 % PADS 6 each, 6 each, Topical, Daily, Swayze, Ava, DO, 6 each at 10/13/23 1036   dexamethasone (DECADRON) injection 12 mg, 12 mg, Intravenous, Q6H, Swayze, Ava, DO, 12 mg at 10/13/23 1404   insulin  aspart (novoLOG) injection 0-20 Units, 0-20 Units, Subcutaneous, TID WC, Swayze, Ava, DO, 11 Units at 10/13/23 1402   insulin  glargine-yfgn (SEMGLEE ) injection 20 Units, 20 Units, Subcutaneous, QHS, Swayze, Ava, DO, 20 Units at 10/12/23 2123   vancomycin (VANCOCIN) IVPB 1000 mg/200 mL premix, 1,000 mg, Intravenous, Q24H, Corrin Dimes, [facility], Last Rate: 200 mL/hr at 10/12/23 2129, 1,000 mg at 10/12/23 2129  Labs and Diagnostic Imaging   Procalcitonin 0.11 pCO2 29 Ammonia less than 13 Creatinine 2.31 with a BUN of 41 TSH 1.002 CK is normal Overnight EEG is normal CSF protein 57 CSF glucose 191 CSF WBC two CSF RBC 47 Negative meningoencephalitis panel including HSV   Assessment   Connor Dixon is a 89 y.o. male who presented with severe encephalopathy which is currently of unclear etiology.  He is certainly improving, unclear if this is due to the antibiotics or simply natural history.  No evidence of seizure on prolonged EEG, no evidence of stroke on MRI.  He is improving, possibilities including septic encephalopathy of unclear source with response to antibiotics, delirium of unclear etiology, autoimmune encephalopathy which is improving secondary to the steroids given for meningitis coverage.  As I said, he has improving, but I will send autoimmune encephalopathy testing.  Recommendations   Follow-up autoimmune encephalopathy testing Can discontinue empiric meningitic coverage May consider more  aggressive autoimmune treatment if he does not continue to rapidly improve Neurology will continue to follow. ______________________________________________________________________   Connor Hummer, MD [facility]

## 2023-10-13 NOTE — TOC CM/SW Note (Signed)
 Transition of Care [facility]) - Inpatient Brief Assessment   Patient Details  Name: Connor Dixon MRN: [id number] Date of Birth: 07/14/1934  Transition [facility]) CM/SW Contact:    Jonathan Neighbor, RN Phone Number: 10/13/2023, 4:04 PM   Clinical Narrative:  Awaiting LP results. Palliative following. Needs therapy evals.  TOC following.  Transition of Care Asessment: Insurance and Status: Insurance coverage has been reviewed Patient has primary care physician: Yes Home environment has been reviewed: from home--has son   Prior/Current Home Services: No current home services Social Drivers of Health Review: SDOH reviewed no interventions necessary Readmission risk has been reviewed: Yes Transition of care needs: transition of care needs identified, TOC will continue to follow

## 2023-10-13 NOTE — Procedures (Signed)
 PROCEDURE SUMMARY:  Successful fluoroscopic guided lumbar puncture at the level of L2-3.  ~14 mL clear colorless fluid collected and sent for labs.  No immediate complications.  Pt tolerated well.   EBL = none  Please see full dictation in imaging section of Epic for procedure details.   Electronically Signed: Tria Noguera M Bernadett Milian, PA-C 10/13/2023, 2:03 PM

## 2023-10-13 NOTE — Evaluation (Signed)
 Physical Therapy Evaluation Patient Details Name: Connor Dixon MRN: [id number] DOB: 1934-09-20 Today's Date: 10/13/2023  History of Present Illness  Pt is 89 year old presented to [facility] on  10/10/23 after found at home unresponsive. Pt with dense encephalopathy with flexor positioning of UE's. CT negative for acute changes and MRI pending.  PMH - afib on Eliquis , pacer, HTN, CKD, DM, obesity  Clinical Impression  Pt is presenting below baseline level of functioning. Currently pt is 2 person Max A for bed mobility and sit to stand at EOB. Pt requires multi modal cues for sequencing. Due to pt current functional status, home set up and available assistance at home recommending skilled physical therapy services < 3 hours/day in order to address strength, balance and functional mobility to decrease risk for falls, injury, immobility, skin break down and re-hospitalization.         If plan is discharge home, recommend the following: Two people to help with walking and/or transfers;Assistance with cooking/housework;Supervision due to cognitive status;Assist for transportation;Help with stairs or ramp for entrance   Can travel by private vehicle   No    Equipment Recommendations BSC/3in1;Wheelchair cushion (measurements PT);Wheelchair (measurements PT)     Functional Status Assessment Patient has had a recent decline in their functional status and demonstrates the ability to make significant improvements in function in a reasonable and predictable amount of time.     Precautions / Restrictions Precautions Precautions: Fall Recall of Precautions/Restrictions: Impaired Restrictions Weight Bearing Restrictions Per Provider Order: No      Mobility  Bed Mobility Overal bed mobility: Needs Assistance Bed Mobility: Supine to Sit, Sit to Supine     Supine to sit: Max assist, Used rails Sit to supine: Max assist, +2 for physical assistance   General bed mobility comments: therapy assisting  with pt hand placement using rails    Transfers Overall transfer level: Needs assistance Equipment used: 2 person hand held assist Transfers: Sit to/from Stand Sit to Stand: Max assist, +2 physical assistance, +2 safety/equipment           General transfer comment: Max A+2 for lateral steps at bedside, therapists assisting with repositoining of BLEs and weight shifting.    Ambulation/Gait       Pre-gait activities: attempting side stepping at EOB with Max A for wgt shifting and progression laterally of the R and L LE       Balance Overall balance assessment: Needs assistance Sitting-balance support: Feet supported Sitting balance-Leahy Scale: Fair Sitting balance - Comments: min A to CGA sitting EOB, intermittenent R LOB Postural control: Right lateral lean Standing balance support: Bilateral upper extremity supported Standing balance-Leahy Scale: Zero Standing balance comment: Max A +2         Pertinent Vitals/Pain Pain Assessment Pain Assessment: Faces Faces Pain Scale: No hurt Breathing: normal Negative Vocalization: none Facial Expression: smiling or inexpressive Body Language: relaxed Consolability: no need to console PAINAD Score: 0 Pain Intervention(s): Monitored during session    Home Living Family/patient expects to be discharged to:: Skilled nursing facility        Prior Function Prior Level of Function : Needs assist  Cognitive Assist : ADLs (cognitive)           Mobility Comments: Pt was not using an AD though pt has a SPC and RW. Son reports pt has fallen 2x recently ADLs Comments: Had some assist with medications off and on a couple days a week from a family friend.  Extremity/Trunk Assessment   Upper Extremity Assessment Upper Extremity Assessment: Defer to OT evaluation    Lower Extremity Assessment Lower Extremity Assessment: Difficult to assess due to impaired cognition    Cervical / Trunk Assessment Cervical / Trunk  Assessment: Kyphotic  Communication   Communication Communication: Impaired Factors Affecting Communication: Difficulty expressing self    Cognition Arousal: Alert Behavior During Therapy: Restless   PT - Cognitive impairments: Attention, Initiation, Sequencing, Problem solving, Safety/Judgement, Awareness, Memory       Following commands: Impaired Following commands impaired: Follows one step commands with increased time, Only follows one step commands consistently     Cueing Cueing Techniques: Verbal cues, Gestural cues, Tactile cues, Visual cues     General Comments General comments (skin integrity, edema, etc.): Family present at bedside (brother and son)        Assessment/Plan    PT Assessment Patient needs continued PT services  PT Problem List Decreased strength;Decreased activity tolerance;Decreased balance;Decreased mobility;Decreased safety awareness;Decreased cognition       PT Treatment Interventions DME instruction;Balance training;Gait training;Neuromuscular re-education;Stair training;Functional mobility training;Therapeutic activities;Therapeutic exercise;Patient/family education    PT Goals (Current goals can be found in the Care Plan section)  Acute Rehab PT Goals Patient Stated Goal: improve pt mobility PT Goal Formulation: With family Time For Goal Achievement: 10/27/23 Potential to Achieve Goals: Good    Frequency Min 2X/week     Co-evaluation PT/OT/SLP Co-Evaluation/Treatment: Yes Reason for Co-Treatment: Complexity of the patient's impairments (multi-system involvement);Necessary to address cognition/behavior during functional activity PT goals addressed during session: Mobility/safety with mobility;Balance OT goals addressed during session: ADL's and self-care       AM-PAC PT "6 Clicks" Mobility  Outcome Measure Help needed turning from your back to your side while in a flat bed without using bedrails?: A Lot Help needed moving from  lying on your back to sitting on the side of a flat bed without using bedrails?: A Lot Help needed moving to and from a bed to a chair (including a wheelchair)?: Total Help needed standing up from a chair using your arms (e.g., wheelchair or bedside chair)?: Total Help needed to walk in hospital room?: Total Help needed climbing 3-5 steps with a railing? : Total 6 Click Score: 8    End of Session Equipment Utilized During Treatment: Gait belt Activity Tolerance: Patient tolerated treatment well;Patient limited by fatigue Patient left: in bed;with call bell/phone within reach;with bed alarm set;with family/visitor present Nurse Communication: Mobility status PT Visit Diagnosis: Other abnormalities of gait and mobility (R26.89);History of falling (Z91.81);Unsteadiness on feet (R26.81)    Time: 4098-1191 PT Time Calculation (min) (ACUTE ONLY): 41 min   Charges:     PT Treatments $Therapeutic Activity: 8-22 mins PT General Charges $$ ACUTE PT VISIT: 1 Visit        Sloan Duncans, DPT, CLT  Acute Rehabilitation Services Office: [phone] (Secure chat preferred)   Jenice Mitts 10/13/2023, 4:59 PM

## 2023-10-14 ENCOUNTER — Inpatient Hospital Stay (HOSPITAL_COMMUNITY)

## 2023-10-14 DIAGNOSIS — R293 Abnormal posture: Secondary | ICD-10-CM | POA: Diagnosis not present

## 2023-10-14 DIAGNOSIS — G9340 Encephalopathy, unspecified: Secondary | ICD-10-CM | POA: Diagnosis not present

## 2023-10-14 DIAGNOSIS — I480 Paroxysmal atrial fibrillation: Secondary | ICD-10-CM | POA: Diagnosis not present

## 2023-10-14 DIAGNOSIS — Z515 Encounter for palliative care: Secondary | ICD-10-CM | POA: Diagnosis not present

## 2023-10-14 LAB — CBC WITH DIFFERENTIAL/PLATELET
Abs Immature Granulocytes: 0.12 10*3/uL — ABNORMAL HIGH (ref 0.00–0.07)
Basophils Absolute: 0 10*3/uL (ref 0.0–0.1)
Basophils Relative: 0 %
Eosinophils Absolute: 0 10*3/uL (ref 0.0–0.5)
Eosinophils Relative: 0 %
HCT: 48 % (ref 39.0–52.0)
Hemoglobin: 15.9 g/dL (ref 13.0–17.0)
Immature Granulocytes: 1 %
Lymphocytes Relative: 3 %
Lymphs Abs: 0.4 10*3/uL — ABNORMAL LOW (ref 0.7–4.0)
MCH: 30.6 pg (ref 26.0–34.0)
MCHC: 33.1 g/dL (ref 30.0–36.0)
MCV: 92.3 fL (ref 80.0–100.0)
Monocytes Absolute: 1.1 10*3/uL — ABNORMAL HIGH (ref 0.1–1.0)
Monocytes Relative: 9 %
Neutro Abs: 10.7 10*3/uL — ABNORMAL HIGH (ref 1.7–7.7)
Neutrophils Relative %: 87 %
Platelets: 269 10*3/uL (ref 150–400)
RBC: 5.2 MIL/uL (ref 4.22–5.81)
RDW: 14.6 % (ref 11.5–15.5)
WBC: 12.3 10*3/uL — ABNORMAL HIGH (ref 4.0–10.5)
nRBC: 0 % (ref 0.0–0.2)

## 2023-10-14 LAB — GLUCOSE, CAPILLARY
Glucose-Capillary: 217 mg/dL — ABNORMAL HIGH (ref 70–99)
Glucose-Capillary: 268 mg/dL — ABNORMAL HIGH (ref 70–99)
Glucose-Capillary: 257 mg/dL — ABNORMAL HIGH (ref 70–99)
Glucose-Capillary: 268 mg/dL — ABNORMAL HIGH (ref 70–99)
Glucose-Capillary: 250 mg/dL — ABNORMAL HIGH (ref 70–99)
Glucose-Capillary: 248 mg/dL — ABNORMAL HIGH (ref 70–99)

## 2023-10-14 LAB — BASIC METABOLIC PANEL WITH GFR
Anion gap: 12 (ref 5–15)
BUN: 52 mg/dL — ABNORMAL HIGH (ref 8–23)
CO2: 23 mmol/L (ref 22–32)
Calcium: 9.1 mg/dL (ref 8.9–10.3)
Chloride: 111 mmol/L (ref 98–111)
Creatinine, Ser: 1.95 mg/dL — ABNORMAL HIGH (ref 0.61–1.24)
GFR, Estimated: 33 mL/min — ABNORMAL LOW (ref >60)
Glucose, Bld: 250 mg/dL — ABNORMAL HIGH (ref 70–99)
Potassium: 3.5 mmol/L (ref 3.5–5.1)
Sodium: 146 mmol/L — ABNORMAL HIGH (ref 135–145)

## 2023-10-14 LAB — RPR: RPR Ser Ql: NONREACTIVE

## 2023-10-14 MED ORDER — METOPROLOL TARTRATE 25 MG PO TABS
25.0000 mg | ORAL_TABLET | Freq: Two times a day (BID) | ORAL | Status: DC
  Administered 2023-10-14 – 2023-10-16 (×5): 25 mg via ORAL
  Filled 2023-10-14 (×5): qty 1

## 2023-10-14 MED ORDER — IMMUNE GLOBULIN (HUMAN) 10 GM/100ML IV SOLN
400.0000 mg/kg | INTRAVENOUS | Status: AC
  Administered 2023-10-14 – 2023-10-18 (×5): 35 g via INTRAVENOUS
  Filled 2023-10-14 (×5): qty 350

## 2023-10-14 MED ORDER — ENSURE PLUS HIGH PROTEIN PO LIQD
237.0000 mL | Freq: Two times a day (BID) | ORAL | Status: DC
  Administered 2023-10-14 – 2023-10-16 (×3): 237 mL via ORAL

## 2023-10-14 NOTE — Progress Notes (Signed)
 Palliative Care Progress Note, Assessment & Plan   Patient Name: Connor Dixon       Date: 10/14/2023 DOB: 1934/10/25  Age: 89 y.o. MRN#: [id number] Attending Physician: Junita Oliva, DO Primary Care Physician: Arcadio Knuckles, MD Admit Date: 10/10/2023  Subjective: Patient is sitting up in bed, working with SLP.  His son Connor Dixon and Stephen's wife are at bedside.  Patient is awake and alert, acknowledges my presence.  HPI: 89 y.o. male  with past medical history of A-fib (Eliquis ), s/p PPM for second-degree heart block, HTN, CKD (stage III), type 1 diabetes, morbid obesity, and HLD admitted on 10/10/2023 after being found down at home.  Last known well was 5/26 at 1700.   EGD revealed moderate diffuse encephalopathy with no seizures or epileptiform discharges noted.  Overnight EEG results pending.  Pending.  Workup remains in progress.  CT of the head revealed remote lacunar infarct in the anterior right corona radiata.  CTA revealed no evidence of significant stenosis, aneurysmal dilatation, or dissection involving the arteries of the head and neck. MRI WNL. LP to be performed today.    PMT was consulted to support patient and family with goals of care discussions.  Summary of counseling/coordination of care: Extensive chart review completed prior to meeting patient including labs, vital signs, imaging, progress notes, orders, and available advanced directive documents from current and previous encounters.   After reviewing the patient's chart and assessing the patient at bedside, I spoke with patient in regards to symptom management and goals of care.  Patient is awake can track me when his eyes and can make some comprehensible words.  However cannot participate in complete symptom assessment.  Nonverbal  signs of distress such as grimacing, moaning, and fidgeting not noted.  Patient reaches for cup provided by SLP and seems eager to drink/swallow.  Discussed plan of care with son at bedside.  Reviewed neurology's recommendations to continue with CSF autoimmune workup.  Attempted to elicit values and goals important to patient and his family.  Connor Dixon is grateful that patient has shown signs of improvement though source of acute third mental status is not definitive at this time.  Discussed workup will continue.  Additionally, PT/OT will work with patient as well as SLP to improve his functional, nutritional, and cognitive status is.  No change to plan of care at this time.  DNR with limited interventions remains.  PMT will continue to follow along and support patient and family throughout his hospitalization.  Family has PMT contact info and was encouraged to call with any acute palliative needs.  Physical Exam Vitals reviewed.  Constitutional:      General: He is not in acute distress. HENT:     Head: Normocephalic.     Mouth/Throat:     Mouth: Mucous membranes are moist.  Eyes:     Pupils: Pupils are equal, round, and reactive to light.  Cardiovascular:     Pulses: Normal pulses.  Pulmonary:     Effort: Pulmonary effort is normal.  Abdominal:     Palpations: Abdomen is soft.  Musculoskeletal:     Comments: Generalized weakness  Skin:    General: Skin is warm and dry.  Neurological:     Mental Status: He is alert.     Comments: Oriented to self, recognizes his son  Psychiatric:        Mood and Affect: Mood normal.        Behavior: Behavior normal.             Total Time 35 minutes   Time spent includes: Detailed review of medical records (labs, imaging, vital signs), medically appropriate exam (mental status, respiratory, cardiac, skin), discussed with treatment team, counseling and educating patient, family and staff, documenting clinical information, medication  management and coordination of care.  Judeen Nose L. Rebbeca Campi, DNP, FNP-BC Palliative Medicine Team

## 2023-10-14 NOTE — Progress Notes (Signed)
 TRH night cross cover note:   I was notified by the patient's RN that the patient is refusing telemetry monitoring this evening.  Per brief chart review, vital signs today appear stable, including afebrile, heart rates in the 70s to 80s; systolic blood pressures in 140s to 150s, respiratory rate 14-18 and oxygen saturation in the mid 90s to 100% on room air.  Will hold telemetry monitoring overnight, per patient's request.     Camelia Cavalier, DO Hospitalist

## 2023-10-14 NOTE — Progress Notes (Signed)
 NEUROLOGY CONSULT FOLLOW UP NOTE   Date of service: Oct 14, 2023 Patient Name: Connor Dixon MRN:  [id number] DOB:  Apr 13, 1935  Interval Hx/subjective   His mental status is improving, but he still appears somewhat aphasic and severely dysarthric.  Vitals   Vitals:   10/13/23 1610 10/13/23 1941 10/13/23 2343 10/14/23 0324  BP: (!) 159/104 (!) 168/101 (!) 156/88 (!) 149/101  Pulse: 74 74 73 85  Resp:      Temp: 98.3 F (36.8 C) 97.8 F (36.6 C) 97.7 F (36.5 C) 98.3 F (36.8 C)  TempSrc: Oral Oral Oral Oral  SpO2: 99% 100% 100% 93%  Weight:         Body mass index is 30.5 kg/m.  Physical Exam   Constitutional: Appears elderly  Neurologic Examination    MS: Opens eyes.  Most of his speech is unintelligible, and he only follows commands intermittently, however he does ask "what happened?"  And say several other phrases which are understandable though dysarthric CN: Crosses midline in both directions, blinks to threat bilaterally but does not count fingers, pupils are reactive bilaterally face is symmetric Motor: He moves all extremities spontaneously, Sensory: As above  Medications  Current Facility-Administered Medications:     stroke: early stages of recovery book, , Does not apply, Once, Doutova, Anastassia, MD   acetaminophen  (TYLENOL ) tablet 650 mg, 650 mg, Oral, Q4H PRN, 650 mg at 10/13/23 0555 **OR** acetaminophen  (TYLENOL ) 160 MG/5ML solution 650 mg, 650 mg, Per Tube, Q4H PRN **OR** acetaminophen  (TYLENOL ) suppository 650 mg, 650 mg, Rectal, Q4H PRN, Doutova, Anastassia, MD   Chlorhexidine  Gluconate Cloth 2 % PADS 6 each, 6 each, Topical, Daily, Swayze, Ava, DO, 6 each at 10/14/23 0859   dexamethasone  (DECADRON ) injection 12 mg, 12 mg, Intravenous, Q6H, Swayze, Ava, DO, 12 mg at 10/14/23 0554   insulin  aspart (novoLOG ) injection 0-20 Units, 0-20 Units, Subcutaneous, TID WC, Swayze, Ava, DO, 11 Units at 10/14/23 4098   insulin  glargine-yfgn (SEMGLEE ) injection 20  Units, 20 Units, Subcutaneous, QHS, Swayze, Ava, DO, 20 Units at 10/12/23 2123  Labs and Diagnostic Imaging   Procalcitonin 0.11 pCO2 29 Ammonia less than 13 Creatinine 2.31 with a BUN of 41 TSH 1.002 CK is normal Overnight EEG is normal CSF protein 57 CSF glucose 191 CSF WBC two CSF RBC 47 Negative meningoencephalitis panel including HSV   Assessment   Connor Dixon is a 88 y.o. male who presented with severe encephalopathy which is currently of unclear etiology.  He is certainly improving, unclear if this is due to the antibiotics or simply natural history.  No evidence of seizure on prolonged EEG, no evidence of stroke on MRI.  He is improving, possibilities including septic encephalopathy of unclear source with response to antibiotics, delirium of unclear etiology, autoimmune encephalopathy.   Though various encephalopathies are possible, though profound and sudden nature of his presentation makes me think that there is some other explanation.  Septic encephalopathy typically does not cause flexor posturing which he was doing on arrival.  Despite posturing still being present, continuous EEG was negative, and his exam did not seem very consistent with seizure on arrival either.  At this point he has had extensive workup for CNS processes though would be likely to cause his very impressive presentation, without clear etiology.  His improvement could be spontaneous, or could be secondary to the dexamethasone  that he received as part of his empiric meningitic coverage.  At this point, without other clear explanation, I would  favor second tier autoimmune treatment as autoimmune labs will be significantly delayed.   Recommendations   Follow-up autoimmune encephalopathy testing I will get a CT chest abdomen pelvis to look for occult malignancy I would favor starting IVIG given the extensive negative workup, can discontinue dexamethasone . Daily BMP while on IVIG CSF autoimmune panel to  [facility] is pending Serum autoimmune encephalitis panel to [facility] is pending OC bands, IgG index is pending Neurology will follow ______________________________________________________________________   Signed, Ann Keto, MD [facility]

## 2023-10-14 NOTE — TOC Initial Note (Signed)
 Transition of Care [facility]) - Initial/Assessment Note    Patient Details  Name: Connor Dixon MRN: [id number] Date of Birth: 08/16/1934  [facility] CM/SW Contact:    Valley Gavia, LCSWA Phone Number: 10/14/2023, 10:14 AM  Clinical Narrative:                  CSW spoke with pt's son Landon Pinion concerning SNF workup, he states he would rather wait and discuss once pt is more medically stable as he does not feel ready to discuss SNF dc just yet. TOC will continue to follow.  Expected Discharge Plan:  (TBD)     Patient Goals and CMS Choice            Expected Discharge Plan and Services                                              Prior Living Arrangements/Services                       Activities of Daily Living      Permission Sought/Granted                  Emotional Assessment              Admission diagnosis:  Acute encephalopathy [G93.40] Patient Active Problem List   Diagnosis Date Noted   Acute encephalopathy 10/10/2023   Elevated troponin 10/10/2023   Prolonged QT interval 10/10/2023   CKD stage 4 due to type 2 diabetes mellitus (HCC) 09/06/2023   Seasonal allergic rhinitis due to pollen 09/06/2023   Post-traumatic osteoarthritis of left shoulder 01/05/2023   Paroxysmal atrial fibrillation (HCC) 09/03/2021   Diabetic nephropathy associated with type 2 diabetes mellitus (HCC) 06/09/2021   Vitamin D  deficiency disease 06/09/2021   Insulin -requiring or dependent type II diabetes mellitus (HCC) 06/09/2021   Encounter for general adult medical examination with abnormal findings 06/09/2020   Sinus node dysfunction (HCC) 02/13/2019   PPM-St.Jude 07/22/2010   Heart block AV complete (HCC) 07/13/2010   Diabetes mellitus with diabetic neuropathy (HCC) 08/06/2007   Hyperlipidemia with target LDL less than 70 08/06/2007   Morbid obesity (HCC) 08/06/2007   Essential hypertension 08/06/2007   PCP:  Arcadio Knuckles,  MD Pharmacy:   [id number] [place], [place] - [address] AT [facility] OF [address] & [address] [place] [address] Phone: [phone] Fax: [phone]  [facility] [address] [place] [address] Phone: [phone] Fax: [phone]     Social Drivers of Health (SDOH) Social History: SDOH Screenings   Food Insecurity: Patient Unable To Answer (10/13/2023)  Housing: Patient Unable To Answer (10/13/2023)  Transportation Needs: Patient Unable To Answer (10/13/2023)  Utilities: Patient Unable To Answer (10/13/2023)  Alcohol Screen: Low Risk  (04/06/2023)  Depression (PHQ2-9): Low Risk  (09/06/2023)  Financial Resource Strain: Low Risk  (04/06/2023)  Physical Activity: Inactive (04/06/2023)  Social Connections: Unknown (10/13/2023)  Stress: No Stress Concern Present (04/06/2023)  Tobacco Use: Low Risk  (10/10/2023)  Health Literacy: Adequate Health Literacy (04/06/2023)   SDOH Interventions:     Readmission Risk Interventions     No data to display

## 2023-10-14 NOTE — Plan of Care (Signed)
 Problem: Skin Integrity: Goal: Risk for impaired skin integrity will decrease Outcome: Progressing   Problem: Tissue Perfusion: Goal: Adequacy of tissue perfusion will improve Outcome: Progressing   Problem: Education: Goal: Ability to describe self-care measures that may prevent or decrease complications (Diabetes Survival Skills Education) will improve Outcome: Not Progressing Goal: Individualized Educational Video(s) Outcome: Not Progressing   Problem: Coping: Goal: Ability to adjust to condition or change in health will improve Outcome: Not Progressing   Problem: Fluid Volume: Goal: Ability to maintain a balanced intake and output will improve Outcome: Not Progressing   Problem: Health Behavior/Discharge Planning: Goal: Ability to identify and utilize available resources and services will improve Outcome: Not Progressing Goal: Ability to manage health-related needs will improve Outcome: Not Progressing   Problem: Metabolic: Goal: Ability to maintain appropriate glucose levels will improve Outcome: Not Progressing   Problem: Nutritional: Goal: Maintenance of adequate nutrition will improve Outcome: Not Progressing Goal: Progress toward achieving an optimal weight will improve Outcome: Not Progressing   Problem: Education: Goal: Knowledge of disease or condition will improve Outcome: Not Progressing Goal: Knowledge of secondary prevention will improve (MUST DOCUMENT ALL) Outcome: Not Progressing Goal: Knowledge of patient specific risk factors will improve (DELETE if not current risk factor) Outcome: Not Progressing   Problem: Ischemic Stroke/TIA Tissue Perfusion: Goal: Complications of ischemic stroke/TIA will be minimized Outcome: Not Progressing   Problem: Coping: Goal: Will verbalize positive feelings about self Outcome: Not Progressing Goal: Will identify appropriate support needs Outcome: Not Progressing   Problem: Health Behavior/Discharge  Planning: Goal: Ability to manage health-related needs will improve Outcome: Not Progressing Goal: Goals will be collaboratively established with patient/family Outcome: Not Progressing   Problem: Self-Care: Goal: Ability to participate in self-care as condition permits will improve Outcome: Not Progressing Goal: Verbalization of feelings and concerns over difficulty with self-care will improve Outcome: Not Progressing Goal: Ability to communicate needs accurately will improve Outcome: Not Progressing   Problem: Nutrition: Goal: Risk of aspiration will decrease Outcome: Not Progressing Goal: Dietary intake will improve Outcome: Not Progressing   Problem: Education: Goal: Knowledge of General Education information will improve Description: Including pain rating scale, medication(s)/side effects and non-pharmacologic comfort measures Outcome: Not Progressing   Problem: Health Behavior/Discharge Planning: Goal: Ability to manage health-related needs will improve Outcome: Not Progressing   Problem: Clinical Measurements: Goal: Ability to maintain clinical measurements within normal limits will improve Outcome: Not Progressing Goal: Will remain free from infection Outcome: Not Progressing Goal: Diagnostic test results will improve Outcome: Not Progressing Goal: Respiratory complications will improve Outcome: Not Progressing Goal: Cardiovascular complication will be avoided Outcome: Not Progressing   Problem: Activity: Goal: Risk for activity intolerance will decrease Outcome: Not Progressing   Problem: Nutrition: Goal: Adequate nutrition will be maintained Outcome: Not Progressing   Problem: Coping: Goal: Level of anxiety will decrease Outcome: Not Progressing   Problem: Elimination: Goal: Will not experience complications related to bowel motility Outcome: Not Progressing Goal: Will not experience complications related to urinary retention Outcome: Not  Progressing   Problem: Pain Managment: Goal: General experience of comfort will improve and/or be controlled Outcome: Not Progressing   Problem: Safety: Goal: Ability to remain free from injury will improve Outcome: Not Progressing   Problem: Skin Integrity: Goal: Risk for impaired skin integrity will decrease Outcome: Not Progressing   Problem: Education: Goal: Ability to describe self-care measures that may prevent or decrease complications (Diabetes Survival Skills Education) will improve Outcome: Not Progressing Goal: Individualized Educational Video(s) Outcome: Not Progressing  Problem: Coping: Goal: Ability to adjust to condition or change in health will improve Outcome: Not Progressing   Problem: Fluid Volume: Goal: Ability to maintain a balanced intake and output will improve Outcome: Not Progressing   Problem: Health Behavior/Discharge Planning: Goal: Ability to identify and utilize available resources and services will improve Outcome: Not Progressing Goal: Ability to manage health-related needs will improve Outcome: Not Progressing   Problem: Metabolic: Goal: Ability to maintain appropriate glucose levels will improve Outcome: Not Progressing   Problem: Nutritional: Goal: Maintenance of adequate nutrition will improve Outcome: Not Progressing Goal: Progress toward achieving an optimal weight will improve Outcome: Not Progressing   Problem: Education: Goal: Knowledge of disease or condition will improve Outcome: Not Progressing Goal: Knowledge of secondary prevention will improve (MUST DOCUMENT ALL) Outcome: Not Progressing Goal: Knowledge of patient specific risk factors will improve (DELETE if not current risk factor) Outcome: Not Progressing   Problem: Ischemic Stroke/TIA Tissue Perfusion: Goal: Complications of ischemic stroke/TIA will be minimized Outcome: Not Progressing   Problem: Coping: Goal: Will verbalize positive feelings about  self Outcome: Not Progressing Goal: Will identify appropriate support needs Outcome: Not Progressing   Problem: Health Behavior/Discharge Planning: Goal: Ability to manage health-related needs will improve Outcome: Not Progressing Goal: Goals will be collaboratively established with patient/family Outcome: Not Progressing   Problem: Self-Care: Goal: Ability to participate in self-care as condition permits will improve Outcome: Not Progressing Goal: Verbalization of feelings and concerns over difficulty with self-care will improve Outcome: Not Progressing Goal: Ability to communicate needs accurately will improve Outcome: Not Progressing   Problem: Nutrition: Goal: Risk of aspiration will decrease Outcome: Not Progressing Goal: Dietary intake will improve Outcome: Not Progressing   Problem: Education: Goal: Knowledge of General Education information will improve Description: Including pain rating scale, medication(s)/side effects and non-pharmacologic comfort measures Outcome: Not Progressing   Problem: Health Behavior/Discharge Planning: Goal: Ability to manage health-related needs will improve Outcome: Not Progressing   Problem: Clinical Measurements: Goal: Ability to maintain clinical measurements within normal limits will improve Outcome: Not Progressing Goal: Will remain free from infection Outcome: Not Progressing Goal: Diagnostic test results will improve Outcome: Not Progressing Goal: Respiratory complications will improve Outcome: Not Progressing Goal: Cardiovascular complication will be avoided Outcome: Not Progressing   Problem: Activity: Goal: Risk for activity intolerance will decrease Outcome: Not Progressing   Problem: Nutrition: Goal: Adequate nutrition will be maintained Outcome: Not Progressing   Problem: Coping: Goal: Level of anxiety will decrease Outcome: Not Progressing   Problem: Elimination: Goal: Will not experience complications  related to bowel motility Outcome: Not Progressing Goal: Will not experience complications related to urinary retention Outcome: Not Progressing   Problem: Pain Managment: Goal: General experience of comfort will improve and/or be controlled Outcome: Not Progressing   Problem: Safety: Goal: Ability to remain free from injury will improve Outcome: Not Progressing   Problem: Skin Integrity: Goal: Risk for impaired skin integrity will decrease Outcome: Not Progressing

## 2023-10-14 NOTE — Progress Notes (Signed)
 Progress Note   Patient: Connor Dixon KGM:[id number] DOB: 02/06/1935 DOA: 10/10/2023     4 DOS: the patient was seen and examined on 10/14/2023   Brief hospital course: The patient is a 89 yr old man who was brought to the ED via EMS after he was found slumped over in his chair to the right and unresponsive by his Goddaughter who comes over every Tuesday to check on him and help him with the house. Prior to this the last time that the patient was "normal" was in a conversation with his son who lives in [place]  at 1700 on 10/09/2023. He apparently had not taken any of his medications on the evening of 5/26 and had soiled the chair.   The patient's past medical history includes: CHF - chronic Arthus phenomenon Complete AV block, s/p PCM CKD 3b DM I HTN Hyperlipidemia Morbid obesity  In the ED the patient underwent a CT head that demonstrated no acute process. A CTA head and neck demonstrated no LVO. There was however evidence of a remote lacunar infarct of the anterior right corona radiata. I have discussed the patient with Dr. Alecia Ames. There is concern for Seizure, Posterior Circulation CVA, and meningitis. The patient will need to be off of anticoagulation for at least 48 hours. LP is planned for 10/12/2023. The patient will be started on empiric antibiotic coverage for meningitis.   MRI was performed and demonstrated no acute intracranial abnormality. There was evidence for chronic small vessel disease. There was also generalized appearing brain volume loss. EEG is in process. Neurology has evaluated the patient. Plan is for LP on the afternoon of 10/12/2023.   On 10/13/2023 the patient again appears completely obtunded. This may be due to ativan  given for MRI on 10/12/2023, but in any case the patient is not as alert as her was on 10/12/2023.   MRI brain has ruled out CVA.  LP performed on 10/13/2023 has ruled out meningitis. Have added on VDRL of CSF onto studies for the CSF. Antibiotics  for meningitis stopped as per neurology. However, steroids have been continued in case there is an autoimmune component to the patient's illness.  DDx continues to include autoimmune diseases and tertiary syphillis. HIV testing has also been ordered.  Assessment and Plan: 89 y.o. male with medical history significant of a.fib on eliquis , complete heart block sp pacemaker, DM2, HTN, HLD, CKD  Admitted for  Acute encephalopathy      Present on Admission:  Acute encephalopathy  Elevated troponin  Essential hypertension  Hyperlipidemia with target LDL less than 70  Paroxysmal atrial fibrillation (HCC)  Sinus node dysfunction (HCC)  Prolonged QT interval     Elevated troponin Troponin stable, could be in the setting of brain injury  Echo in am If abnormal would get cardiology consult Monitor on tele   Essential hypertension Allow permissive HTN   Hyperlipidemia with target LDL less than 70 Not able to tolerate PO statin at this time   Insulin -requiring or dependent type II diabetes mellitus (HCC) Glucoses have been 180-326 in the last 24 hours due to decadron . Will add lantus  20 units daily Will change from sensitive dose SSI to resistant dose SSI HbA1c was 8.3.   Paroxysmal atrial fibrillation (HCC) Hold eliquis  for tonight, pt unable to tolerate PO  Stroke suspected would not be a good candidate for heparin   Sinus node dysfunction (HCC) Sp pacemaker   Prolonged QT interval - will monitor on tele avoid QT prolonging medications, rehydrate correct  electrolytes   Acute encephalopathy Concerning for significant brain injury Continuous EEG ordered MRI in a.m. ordered Rehydrate monitor for any reversible causes. Appreciate neurology consult discussed case at length with son At this point they wish for patient to be DNR and resuscitate DO NOT INTUBATE Family is in route Prognosis at this point appears to be poor Will know more once results of MRI are back Palliative  care consult PCCM is also aware at this point they feel that patient has no indication for ICU admission On 10/13/2023 the patient again appears completely obtunded. This may be due to ativan  given for MRI on 10/12/2023, but in any case the patient is not as alert as her was on 10/12/2023.  MRI brain has ruled out CVA. LP performed on 10/13/2023 has ruled out meningitis. Have added on VDRL of CSF onto studies for the CSF. Antibiotics for meningitis stopped as per neurology. However, steroids have been continued in case there is an autoimmune component to the patient's illness. DDx continues to include autoimmune diseases and tertiary syphillis. HIV testing has also been ordered.     I have seen and examined this patient myself. I have spent 42 minutes in his evaluation and care including time spent in coordination of care with neurology and discussing patient with family at bedside.   DVT prophylaxis:  SCD      Code Status:  DNR/DNI  as per family  I had personally discussed CODE STATUS with  family  ACP   none    Family Communication:   Family  at  Bedside  plan of care was discussed. Diet  Pending swallow eval.    {Tip this will not be part of the note when signed Body mass index is 30.5 kg/m. , ,  (Optional):26781}  Subjective: The patient is clearly encephalopathic. His gaze is to the right, he has notable right facial droop. His right upper extremity has spastic paralysis. He is moving bilateral lower extremities. He is not verbal and is not able to follow commands or respond verbally.  Physical Exam: Vitals:   10/14/23 1400 10/14/23 1416 10/14/23 1437 10/14/23 1639  BP:   (!) 173/98 (!) 159/90  Pulse:    74  Resp:    14  Temp: (!) 97.4 F (36.3 C) (!) 97 F (36.1 C) (!) 97.1 F (36.2 C) 98 F (36.7 C)  TempSrc:    Oral  SpO2:    96%  Weight:       Exam:  Constitutional:  The patient is obtunded. He kept his eyes open for a minute after I opened them to examine his  pupils. No eye contact. No tracking.  Eyes:  pupils and irises appear normal Normal lids and conjunctivae ENMT:  Noted right facial droop. Neck:  neck appears normal, no masses, normal ROM, supple no thyromegaly Respiratory:  No increased work of breathing. No wheezes, rales, or rhonchi No tactile fremitus Cardiovascular:  Regular rate and rhythm No murmurs, ectopy, or gallups. No lateral PMI. No thrills. Abdomen:  Abdomen is soft, non-tender, non-distended No hernias, masses, or organomegaly Normoactive bowel sounds.  Musculoskeletal:  No cyanosis, clubbing, or edema Skin:  No rashes, lesions, ulcers palpation of skin: no induration or nodules Neurologic:  Right facial droop Non verbal, not following commands Spastic contracture of the right upper extremity Now moving lower extremity bilateral. Appears obtunded. Psychiatric:  Patient is unable to cooperate with exam.   Data Reviewed:  CTA head and neck CT head  Family  Communication: Son is at bedside.   Disposition: Status is: Inpatient Remains inpatient appropriate because: DDX includes posterior circulation CVA, Seizure disorder, and meningitis. The patient is at significant risk of further deterioration of organ function and/or death. Work up is pending including MRI and LP.  Planned Discharge Destination: TBD {Tip this will not be part of the note when signed  DVT Prophylaxis  ., Scd's  (Optional):26781}   Time spent: 36 minutes  Author: Nahshon Reich, DO 10/14/2023 6:58 PM  For on call review [email or web address].

## 2023-10-14 NOTE — Evaluation (Signed)
 Clinical/Bedside Swallow Evaluation Patient Details  Name: Connor Dixon MRN: [id number] Date of Birth: Jan 24, 1935  Today's Date: 10/14/2023 Time: SLP Start Time (ACUTE ONLY): 0935 SLP Stop Time (ACUTE ONLY): 1017 SLP Time Calculation (min) (ACUTE ONLY): 42 min  Past Medical History:  Past Medical History:  Diagnosis Date   Acute on chronic diastolic congestive heart failure (HCC)    Arthus phenomenon    Atrioventricular block, complete (HCC)    Cardiac pacemaker in situ 07/22/2010   Chronic kidney disease, stage 3 (HCC)    Presumably from longstanding DM & HTN. Scr has gradually increased over the past 2 years (1.6 in 12/2010, 2 in 03/2011, and up to 2.1 in 01/2013)   DIABETES MELLITUS, TYPE I, ADULT ONSET 08/06/2007   DM (diabetes mellitus) (HCC)    adult onset    Essential hypertension, benign 08/06/2007   Very good BP control   History of second degree heart block    HLD (hyperlipidemia)    HYPERLIPIDEMIA 08/06/2007   Hypopotassemia    Morbid obesity (HCC)    Obesity    Other and unspecified angina pectoris    typical   Type I (juvenile type) diabetes mellitus without mention of complication, not stated as uncontrolled    (04/25/13 OV with Dr. Irene Mannheim) = Poorly controlled & is working  w/Dr. Alphia Asa to improve this. Most recent Hgb A1c was 8.2% but [name] it was down to 7.6 last month.   Past Surgical History:  Past Surgical History:  Procedure Laterality Date   CATARACT EXTRACTION  8/08   [place]   PPM GENERATOR CHANGEOUT N/A 02/13/2019   Procedure: PPM GENERATOR CHANGEOUT;  Surgeon: Tammie Fall, MD;  Location: [facility];  Service: Cardiovascular;  Laterality: N/A;   PTVDP  12/11   PPM - [employer]   TOTAL HIP ARTHROPLASTY  04/05/06   right   HPI:  Connor Dixon is a 89 y.o. male who presented with severe encephalopathy which is currently of unclear etiology. Per neurologist "he does appear to have flexion posturing in his upper extremities,  so I continue to suspect acute ischemic stroke, however this is not definite and I was hoping the MRI would answer the question today. With continued diagnostic uncertainty, though my suspicion for meningitis is low. I would favor proceeding with lumbar puncture." MRI no intracranial abnormality. Pt had a choking episode  at breakfast with solids after being placed on a reg/thin diet.    Assessment / Plan / Recommendation  Clinical Impression  Pt seen for skilled ST services for swallowing evaluation following a choking episode with sausage and pancakes with breakfast. The pt is currently on a reg/thin diet and was assessed with thin liquids, mildly thick liquids, puree, and solids. The pt's OME was limited due to cognitive impairments, per previous notes and observation with PO's, the pt has a mild right sided facial and labial weakness. The pt appears to have adequate dentition and mild oral residue from limited view. The son Connor Dixon and Connor Dixon report that the choking episode happened with solids and not liquids. The pt had limited and generally nonsensical verbal output with intermittent breathy tone. The family reports this breathiness started at this hospitalization and this his cognitive state has varied greatly t/o the day.  The pt consumed thin liquid full assist via straw with impulsively large sips with x1 dry cough given a 30 second delay from PO intake- no other s/s of aspiration observed. Cup sips not  attempted due to poor awareness. Pt also consumed mildly thick liquid with no overt s/s of aspiration. The pt had express difficulty with oral manipulation, mastication, delayed oral transit, delayed swallow initiation, and intermittent weak labial seal with both purees and solids. He had some instances of functional lingual sweeps to clear pocketing, but when distracted by the environment was not attending this bolus sufficiently. The pt motioned to his mouth, seemingly attempting to expectorate the  puree (however the son notes that may be influenced by taste preference). When verbally and partially physically cued, the pt did not successfully remove the bolus from the oral cavity. Oral swabbing and cued liquid washes appeared to adequately clear the remaining bolus. With all solids trialed, the pt had no wet vocal quality, throat clearing, or coughing. Pt is safe to trial a puree/thin liquid diet with STRICT aspiration precautions (sit upright for all meals, SMALL bites and sips, eat/drink SLOWLY, total assist for all feeding, liquid washes every couple of sips, eliminate distractions, verbal cueing to attend the meal t/o PO intake, ONLY allow PO intake was awake and alert) and ORAL CARE BEFORE AND AFTER EVERY MEAL. Pt's family educated on the diet changes and plan for treatment and are verbally agreeable to it. If this diet is not tolerated well the pt may benefit from a MBSS if medically able to follow directions more consistently. SLP to follow closely for swallow and cognitive tx SLP Visit Diagnosis: Dysphagia, unspecified (R13.10)    Aspiration Risk       Diet Recommendation Dysphagia 1 (Puree);Thin liquid    Liquid Administration via: Straw;Other (Comment) (Total assist for drinking,) Medication Administration: Crushed with puree (attempt liquid wash following puree administration) Supervision: Full supervision/cueing for compensatory strategies Compensations: Minimize environmental distractions;Slow rate;Small sips/bites;Lingual sweep for clearance of pocketing Postural Changes: Seated upright at 90 degrees;Remain upright for at least 30 minutes after po intake    Other  Recommendations Recommended Consults: OP therapy for feeding Oral Care Recommendations: Oral care BID;Oral care before and after PO    Recommendations for follow up therapy are one component of a multi-disciplinary discharge planning process, led by the attending physician.  Recommendations may be updated based on  patient status, additional functional criteria and insurance authorization.  Follow up Recommendations Acute inpatient rehab (3hours/day)      Assistance Recommended at Discharge    Functional Status Assessment Patient has had a recent decline in their functional status and demonstrates the ability to make significant improvements in function in a reasonable and predictable amount of time.  Frequency and Duration min 3x week  1 week       Prognosis Prognosis for improved oropharyngeal function: Fair Barriers to Reach Goals: Cognitive deficits;Language deficits;Behavior;Severity of deficits      Swallow Study   General Date of Onset: 10/13/23 HPI: Connor Dixon is a 89 y.o. male who presented with severe encephalopathy which is currently of unclear etiology. Per neurologist "he does appear to have flexion posturing in his upper extremities, so I continue to suspect acute ischemic stroke, however this is not definite and I was hoping the MRI would answer the question today. With continued diagnostic uncertainty, though my suspicion for meningitis is low. I would favor proceeding with lumbar puncture." MRI no intracranial abnormality. Pt had a choking episode  at breakfast with solids after being placed on a reg/thin diet. Type of Study: Bedside Swallow Evaluation Previous Swallow Assessment: n/a Diet Prior to this Study: Regular;Thin liquids (Level 0) Respiratory Status: Room  air History of Recent Intubation: No Behavior/Cognition: Pleasant mood;Confused;Impulsive;Distractible;Requires cueing;Doesn't follow directions Oral Cavity Assessment: Other (comment) (Dificult to assess due to cognitive deficits. mild concern for oral residue) Oral Care Completed by SLP: Yes Oral Cavity - Dentition: Adequate natural dentition Vision: Impaired for self-feeding Self-Feeding Abilities: Total assist Patient Positioning: Upright in bed Baseline Vocal Quality: Other (comment);Normal;Breathy (Pt  facilated between breathy vocal quality and adequate phonation for all verbal output.) Volitional Cough: Cognitively unable to elicit Volitional Swallow: Unable to elicit    Oral/Motor/Sensory Function Overall Oral Motor/Sensory Function: Mild impairment Facial ROM: Reduced right (Per previous notes) Facial Symmetry: Abnormal symmetry right Facial Strength: Reduced right   Ice Chips Ice chips: Within functional limits   Thin Liquid Thin Liquid: Impaired Presentation: Straw Pharyngeal  Phase Impairments: Throat Clearing - Delayed    Nectar Thick Nectar Thick Liquid: Within functional limits   Honey Thick     Puree Puree: Impaired Presentation: Spoon Oral Phase Impairments: Reduced labial seal;Reduced lingual movement/coordination;Poor awareness of bolus Oral Phase Functional Implications: Oral holding;Oral residue;Prolonged oral transit   Solid     Solid: Impaired Oral Phase Impairments: Poor awareness of bolus;Reduced lingual movement/coordination;Impaired mastication Oral Phase Functional Implications: Oral residue;Oral holding;Prolonged oral transit;Impaired mastication      Eda Gone M.S. CCC-SLP

## 2023-10-15 DIAGNOSIS — R293 Abnormal posture: Secondary | ICD-10-CM | POA: Diagnosis not present

## 2023-10-15 DIAGNOSIS — G9340 Encephalopathy, unspecified: Secondary | ICD-10-CM | POA: Diagnosis not present

## 2023-10-15 LAB — CBC WITH DIFFERENTIAL/PLATELET
Abs Immature Granulocytes: 0.05 10*3/uL (ref 0.00–0.07)
Basophils Absolute: 0 10*3/uL (ref 0.0–0.1)
Basophils Relative: 0 %
Eosinophils Absolute: 0 10*3/uL (ref 0.0–0.5)
Eosinophils Relative: 0 %
HCT: 47.6 % (ref 39.0–52.0)
Hemoglobin: 16 g/dL (ref 13.0–17.0)
Immature Granulocytes: 1 %
Lymphocytes Relative: 4 %
Lymphs Abs: 0.4 10*3/uL — ABNORMAL LOW (ref 0.7–4.0)
MCH: 30.3 pg (ref 26.0–34.0)
MCHC: 33.6 g/dL (ref 30.0–36.0)
MCV: 90.2 fL (ref 80.0–100.0)
Monocytes Absolute: 0.8 10*3/uL (ref 0.1–1.0)
Monocytes Relative: 7 %
Neutro Abs: 9.1 10*3/uL — ABNORMAL HIGH (ref 1.7–7.7)
Neutrophils Relative %: 88 %
Platelets: 201 10*3/uL (ref 150–400)
RBC: 5.28 MIL/uL (ref 4.22–5.81)
RDW: 14.3 % (ref 11.5–15.5)
WBC: 10.3 10*3/uL (ref 4.0–10.5)
nRBC: 0 % (ref 0.0–0.2)

## 2023-10-15 LAB — CULTURE, BLOOD (ROUTINE X 2)
Culture: NO GROWTH
Culture: NO GROWTH

## 2023-10-15 LAB — GLUCOSE, CAPILLARY
Glucose-Capillary: 129 mg/dL — ABNORMAL HIGH (ref 70–99)
Glucose-Capillary: 149 mg/dL — ABNORMAL HIGH (ref 70–99)
Glucose-Capillary: 155 mg/dL — ABNORMAL HIGH (ref 70–99)
Glucose-Capillary: 174 mg/dL — ABNORMAL HIGH (ref 70–99)
Glucose-Capillary: 224 mg/dL — ABNORMAL HIGH (ref 70–99)

## 2023-10-15 LAB — SJOGRENS SYNDROME-B EXTRACTABLE NUCLEAR ANTIBODY: SSB (La) (ENA) Antibody, IgG: <0.2 AI (ref 0.0–0.9)

## 2023-10-15 LAB — SJOGRENS SYNDROME-A EXTRACTABLE NUCLEAR ANTIBODY: SSA (Ro) (ENA) Antibody, IgG: <0.2 AI (ref 0.0–0.9)

## 2023-10-15 LAB — BASIC METABOLIC PANEL WITH GFR
Anion gap: 10 (ref 5–15)
BUN: 50 mg/dL — ABNORMAL HIGH (ref 8–23)
CO2: 21 mmol/L — ABNORMAL LOW (ref 22–32)
Calcium: 8.8 mg/dL — ABNORMAL LOW (ref 8.9–10.3)
Chloride: 113 mmol/L — ABNORMAL HIGH (ref 98–111)
Creatinine, Ser: 1.58 mg/dL — ABNORMAL HIGH (ref 0.61–1.24)
GFR, Estimated: 42 mL/min — ABNORMAL LOW (ref >60)
Glucose, Bld: 144 mg/dL — ABNORMAL HIGH (ref 70–99)
Potassium: 4.4 mmol/L (ref 3.5–5.1)
Sodium: 144 mmol/L (ref 135–145)

## 2023-10-15 LAB — VITAMIN B12: Vitamin B-12: 270 pg/mL (ref 180–914)

## 2023-10-15 LAB — ANA W/REFLEX IF POSITIVE: Anti Nuclear Antibody (ANA): NEGATIVE

## 2023-10-15 MED ORDER — HYDRALAZINE HCL 10 MG PO TABS
10.0000 mg | ORAL_TABLET | Freq: Three times a day (TID) | ORAL | Status: DC | PRN
  Administered 2023-10-15 – 2023-11-03 (×2): 10 mg via ORAL
  Filled 2023-10-15 (×3): qty 1

## 2023-10-15 MED ORDER — CYANOCOBALAMIN 1000 MCG/ML IJ SOLN
1000.0000 ug | Freq: Once | INTRAMUSCULAR | Status: AC
  Administered 2023-10-15: 1000 ug via INTRAMUSCULAR
  Filled 2023-10-15: qty 1

## 2023-10-15 MED ORDER — APIXABAN 2.5 MG PO TABS
2.5000 mg | ORAL_TABLET | Freq: Two times a day (BID) | ORAL | Status: DC
  Administered 2023-10-15 – 2023-11-08 (×49): 2.5 mg via ORAL
  Filled 2023-10-15 (×49): qty 1

## 2023-10-15 MED ORDER — THIAMINE MONONITRATE 100 MG PO TABS
100.0000 mg | ORAL_TABLET | Freq: Every day | ORAL | Status: DC
  Administered 2023-10-15 – 2023-10-16 (×2): 100 mg via ORAL
  Filled 2023-10-15 (×2): qty 1

## 2023-10-15 MED ORDER — ENOXAPARIN SODIUM 40 MG/0.4ML IJ SOSY: 40.0000 mg | PREFILLED_SYRINGE | INTRAMUSCULAR | Status: DC

## 2023-10-15 NOTE — Plan of Care (Signed)
  Problem: Education: Goal: Ability to describe self-care measures that may prevent or decrease complications (Diabetes Survival Skills Education) will improve Outcome: Not Progressing Goal: Individualized Educational Video(s) Outcome: Not Progressing   Problem: Coping: Goal: Ability to adjust to condition or change in health will improve Outcome: Not Progressing   Problem: Fluid Volume: Goal: Ability to maintain a balanced intake and output will improve Outcome: Not Progressing   Problem: Health Behavior/Discharge Planning: Goal: Ability to identify and utilize available resources and services will improve Outcome: Not Progressing Goal: Ability to manage health-related needs will improve Outcome: Not Progressing   Problem: Metabolic: Goal: Ability to maintain appropriate glucose levels will improve Outcome: Not Progressing   Problem: Nutritional: Goal: Maintenance of adequate nutrition will improve Outcome: Not Progressing Goal: Progress toward achieving an optimal weight will improve Outcome: Not Progressing   Problem: Skin Integrity: Goal: Risk for impaired skin integrity will decrease Outcome: Not Progressing   Problem: Tissue Perfusion: Goal: Adequacy of tissue perfusion will improve Outcome: Not Progressing   Problem: Education: Goal: Knowledge of disease or condition will improve Outcome: Not Progressing Goal: Knowledge of secondary prevention will improve (MUST DOCUMENT ALL) Outcome: Not Progressing Goal: Knowledge of patient specific risk factors will improve (DELETE if not current risk factor) Outcome: Not Progressing   Problem: Ischemic Stroke/TIA Tissue Perfusion: Goal: Complications of ischemic stroke/TIA will be minimized Outcome: Not Progressing   Problem: Coping: Goal: Will verbalize positive feelings about self Outcome: Not Progressing Goal: Will identify appropriate support needs Outcome: Not Progressing   Problem: Health Behavior/Discharge  Planning: Goal: Ability to manage health-related needs will improve Outcome: Not Progressing Goal: Goals will be collaboratively established with patient/family Outcome: Not Progressing   Problem: Self-Care: Goal: Ability to participate in self-care as condition permits will improve Outcome: Not Progressing Goal: Verbalization of feelings and concerns over difficulty with self-care will improve Outcome: Not Progressing Goal: Ability to communicate needs accurately will improve Outcome: Not Progressing   Problem: Nutrition: Goal: Risk of aspiration will decrease Outcome: Not Progressing Goal: Dietary intake will improve Outcome: Not Progressing   Problem: Education: Goal: Knowledge of General Education information will improve Description: Including pain rating scale, medication(s)/side effects and non-pharmacologic comfort measures Outcome: Not Progressing   Problem: Health Behavior/Discharge Planning: Goal: Ability to manage health-related needs will improve Outcome: Not Progressing   Problem: Clinical Measurements: Goal: Ability to maintain clinical measurements within normal limits will improve Outcome: Not Progressing Goal: Will remain free from infection Outcome: Not Progressing Goal: Diagnostic test results will improve Outcome: Not Progressing Goal: Respiratory complications will improve Outcome: Not Progressing Goal: Cardiovascular complication will be avoided Outcome: Not Progressing   Problem: Activity: Goal: Risk for activity intolerance will decrease Outcome: Not Progressing   Problem: Nutrition: Goal: Adequate nutrition will be maintained Outcome: Not Progressing   Problem: Coping: Goal: Level of anxiety will decrease Outcome: Not Progressing   Problem: Elimination: Goal: Will not experience complications related to bowel motility Outcome: Not Progressing Goal: Will not experience complications related to urinary retention Outcome: Not  Progressing   Problem: Pain Managment: Goal: General experience of comfort will improve and/or be controlled Outcome: Not Progressing   Problem: Safety: Goal: Ability to remain free from injury will improve Outcome: Not Progressing   Problem: Skin Integrity: Goal: Risk for impaired skin integrity will decrease Outcome: Not Progressing

## 2023-10-15 NOTE — Progress Notes (Signed)
 NEUROLOGY CONSULT FOLLOW UP NOTE   Date of service: October 15, 2023 Patient Name: AUGUSTO DECKMAN MRN:  [id number] DOB:  12-28-1934  Interval Hx/subjective   No significant changes.  Vitals   Vitals:   10/14/23 2357 10/15/23 0445 10/15/23 0755 10/15/23 1202  BP: (!) 161/87 (!) 154/86 (!) 171/68 (!) 168/95  Pulse: 60 62 (!) 48 60  Resp:  14 18 18   Temp: (!) 97.5 F (36.4 C) (!) 97.2 F (36.2 C) 97.6 F (36.4 C) (!) 97.5 F (36.4 C)  TempSrc: Oral Oral Axillary Oral  SpO2: 100% 100% 99% 99%  Weight:         Body mass index is 30.5 kg/m.  Physical Exam   Constitutional: Appears elderly  Neurologic Examination    MS: Opens eyes.  Most of his speech is unintelligible, but he does ask his family "how are you" when he realizes that they are in the room. CN: Crosses midline in both directions, blinks to threat bilaterally but does not count fingers, pupils are reactive bilaterally face is symmetric Motor: He moves all extremities spontaneously. Sensory: As above  Medications  Current Facility-Administered Medications:     stroke: early stages of recovery book, , Does not apply, Once, Doutova, Anastassia, MD   acetaminophen  (TYLENOL ) tablet 650 mg, 650 mg, Oral, Q4H PRN, 650 mg at 10/13/23 0555 **OR** acetaminophen  (TYLENOL ) 160 MG/5ML solution 650 mg, 650 mg, Per Tube, Q4H PRN **OR** acetaminophen  (TYLENOL ) suppository 650 mg, 650 mg, Rectal, Q4H PRN, Doutova, Anastassia, MD   Chlorhexidine  Gluconate Cloth 2 % PADS 6 each, 6 each, Topical, Daily, Swayze, Ava, DO, 6 each at 10/15/23 1000   feeding supplement (ENSURE PLUS HIGH PROTEIN) liquid 237 mL, 237 mL, Oral, BID BM, Augustin Leber, MD, 237 mL at 10/15/23 1000   hydrALAZINE (APRESOLINE) tablet 10 mg, 10 mg, Oral, Q8H PRN, Swayze, Ava, DO   Immune Globulin 10% (PRIVIGEN) IV infusion 35 g, 400 mg/kg, Intravenous, Q24 Hr x 5, Klani Caridi, Althea Atkinson, MD, Last Rate: 364 mL/hr at 10/14/23 1728, Infusion Verify at 10/14/23  1728   insulin  aspart (novoLOG ) injection 0-20 Units, 0-20 Units, Subcutaneous, TID WC, Swayze, Ava, DO, 3 Units at 10/15/23 0644   insulin  glargine-yfgn (SEMGLEE ) injection 20 Units, 20 Units, Subcutaneous, QHS, Swayze, Ava, DO, 20 Units at 10/14/23 2132   metoprolol tartrate (LOPRESSOR) tablet 25 mg, 25 mg, Oral, BID, Swayze, Ava, DO, 25 mg at 10/15/23 1000  Labs and Diagnostic Imaging   Procalcitonin 0.11 pCO2 29 Ammonia less than 13 Creatinine 2.31 with a BUN of 41 TSH 1.002 CK is normal Overnight EEG is normal CSF protein 57 CSF glucose 191 CSF WBC two CSF RBC 47 Negative meningoencephalitis panel including HSV   Assessment   MAKYA YURKO is a 89 y.o. male who presented with severe encephalopathy which is currently of unclear etiology.  He is certainly improving, unclear if this is due to the antibiotics or simply natural history.  No evidence of seizure on prolonged EEG, no evidence of stroke on MRI.  He is improving, possibilities including septic encephalopathy of unclear source with response to antibiotics, delirium of unclear etiology, autoimmune encephalopathy.   Though various encephalopathies are possible, though profound and sudden nature of his presentation makes me think that there is some other explanation.  Septic encephalopathy typically does not cause flexor posturing which he was doing on arrival.  Despite posturing still being present, continuous EEG was negative, and his exam did not seem very consistent with  seizure on arrival either.  At this point he has had extensive workup for CNS processes though would be likely to cause his very impressive presentation, without clear etiology.  His improvement could be spontaneous, or could be secondary to the dexamethasone  that he received as part of his empiric meningitic coverage.  At this point, without other clear explanation, I would favor second tier autoimmune treatment as autoimmune labs will be significantly  delayed.   Recommendations   Follow-up autoimmune encephalopathy testing IVIG day 2/5 I will check B1, B12, though my next of suspicion for this being nutritional is low Daily BMP while on IVIG CSF autoimmune panel to [facility] is pending Serum autoimmune encephalitis panel to [facility] is pending OC bands, IgG index is pending Neurology will follow ______________________________________________________________________   Signed, Ann Keto, MD [facility]

## 2023-10-15 NOTE — Plan of Care (Signed)
 Problem: Education: Goal: Ability to describe self-care measures that may prevent or decrease complications (Diabetes Survival Skills Education) will improve 10/15/2023 0455 by Melville Stade, RN Outcome: Not Progressing 10/15/2023 0155 by Melville Stade, RN Outcome: Not Progressing Goal: Individualized Educational Video(s) 10/15/2023 0455 by Melville Stade, RN Outcome: Not Progressing 10/15/2023 0155 by Melville Stade, RN Outcome: Not Progressing   Problem: Coping: Goal: Ability to adjust to condition or change in health will improve 10/15/2023 0455 by Melville Stade, RN Outcome: Not Progressing 10/15/2023 0155 by Melville Stade, RN Outcome: Not Progressing   Problem: Fluid Volume: Goal: Ability to maintain a balanced intake and output will improve 10/15/2023 0455 by Melville Stade, RN Outcome: Not Progressing 10/15/2023 0155 by Melville Stade, RN Outcome: Not Progressing   Problem: Health Behavior/Discharge Planning: Goal: Ability to identify and utilize available resources and services will improve 10/15/2023 0455 by Melville Stade, RN Outcome: Not Progressing 10/15/2023 0155 by Melville Stade, RN Outcome: Not Progressing Goal: Ability to manage health-related needs will improve 10/15/2023 0455 by Melville Stade, RN Outcome: Not Progressing 10/15/2023 0155 by Melville Stade, RN Outcome: Not Progressing   Problem: Metabolic: Goal: Ability to maintain appropriate glucose levels will improve 10/15/2023 0455 by Melville Stade, RN Outcome: Not Progressing 10/15/2023 0155 by Melville Stade, RN Outcome: Not Progressing   Problem: Nutritional: Goal: Maintenance of adequate nutrition will improve 10/15/2023 0455 by Melville Stade, RN Outcome: Not Progressing 10/15/2023 0155 by Melville Stade, RN Outcome: Not Progressing Goal: Progress toward achieving an optimal weight will improve 10/15/2023 0455 by Melville Stade, RN Outcome: Not Progressing 10/15/2023 0155 by Melville Stade, RN Outcome: Not  Progressing   Problem: Skin Integrity: Goal: Risk for impaired skin integrity will decrease 10/15/2023 0455 by Melville Stade, RN Outcome: Not Progressing 10/15/2023 0155 by Melville Stade, RN Outcome: Not Progressing   Problem: Tissue Perfusion: Goal: Adequacy of tissue perfusion will improve 10/15/2023 0455 by Melville Stade, RN Outcome: Not Progressing 10/15/2023 0155 by Melville Stade, RN Outcome: Not Progressing   Problem: Education: Goal: Knowledge of disease or condition will improve 10/15/2023 0455 by Melville Stade, RN Outcome: Not Progressing 10/15/2023 0155 by Melville Stade, RN Outcome: Not Progressing Goal: Knowledge of secondary prevention will improve (MUST DOCUMENT ALL) 10/15/2023 0455 by Melville Stade, RN Outcome: Not Progressing 10/15/2023 0155 by Melville Stade, RN Outcome: Not Progressing Goal: Knowledge of patient specific risk factors will improve (DELETE if not current risk factor) 10/15/2023 0455 by Melville Stade, RN Outcome: Not Progressing 10/15/2023 0155 by Melville Stade, RN Outcome: Not Progressing   Problem: Ischemic Stroke/TIA Tissue Perfusion: Goal: Complications of ischemic stroke/TIA will be minimized 10/15/2023 0455 by Melville Stade, RN Outcome: Not Progressing 10/15/2023 0155 by Melville Stade, RN Outcome: Not Progressing   Problem: Coping: Goal: Will verbalize positive feelings about self 10/15/2023 0455 by Melville Stade, RN Outcome: Not Progressing 10/15/2023 0155 by Melville Stade, RN Outcome: Not Progressing Goal: Will identify appropriate support needs 10/15/2023 0455 by Melville Stade, RN Outcome: Not Progressing 10/15/2023 0155 by Melville Stade, RN Outcome: Not Progressing   Problem: Health Behavior/Discharge Planning: Goal: Ability to manage health-related needs will improve 10/15/2023 0455 by Melville Stade, RN Outcome: Not Progressing 10/15/2023 0155 by Melville Stade, RN Outcome: Not Progressing Goal: Goals will be collaboratively established  with patient/family 10/15/2023 0455 by Melville Stade, RN Outcome: Not Progressing 10/15/2023 0155 by Melville Stade, RN Outcome: Not Progressing   Problem: Self-Care: Goal: Ability to participate in self-care as condition permits will improve 10/15/2023 0455 by Melville Stade, RN Outcome: Not Progressing 10/15/2023  0155 by Melville Stade, RN Outcome: Not Progressing Goal: Verbalization of feelings and concerns over difficulty with self-care will improve 10/15/2023 0455 by Melville Stade, RN Outcome: Not Progressing 10/15/2023 0155 by Melville Stade, RN Outcome: Not Progressing Goal: Ability to communicate needs accurately will improve 10/15/2023 0455 by Melville Stade, RN Outcome: Not Progressing 10/15/2023 0155 by Melville Stade, RN Outcome: Not Progressing   Problem: Nutrition: Goal: Risk of aspiration will decrease 10/15/2023 0455 by Melville Stade, RN Outcome: Not Progressing 10/15/2023 0155 by Melville Stade, RN Outcome: Not Progressing Goal: Dietary intake will improve 10/15/2023 0455 by Melville Stade, RN Outcome: Not Progressing 10/15/2023 0155 by Melville Stade, RN Outcome: Not Progressing   Problem: Education: Goal: Knowledge of General Education information will improve Description: Including pain rating scale, medication(s)/side effects and non-pharmacologic comfort measures 10/15/2023 0455 by Melville Stade, RN Outcome: Not Progressing 10/15/2023 0155 by Melville Stade, RN Outcome: Not Progressing   Problem: Health Behavior/Discharge Planning: Goal: Ability to manage health-related needs will improve 10/15/2023 0455 by Melville Stade, RN Outcome: Not Progressing 10/15/2023 0155 by Melville Stade, RN Outcome: Not Progressing   Problem: Clinical Measurements: Goal: Ability to maintain clinical measurements within normal limits will improve 10/15/2023 0455 by Melville Stade, RN Outcome: Not Progressing 10/15/2023 0155 by Melville Stade, RN Outcome: Not Progressing Goal: Will remain  free from infection 10/15/2023 0455 by Melville Stade, RN Outcome: Not Progressing 10/15/2023 0155 by Melville Stade, RN Outcome: Not Progressing Goal: Diagnostic test results will improve 10/15/2023 0455 by Melville Stade, RN Outcome: Not Progressing 10/15/2023 0155 by Melville Stade, RN Outcome: Not Progressing Goal: Respiratory complications will improve 10/15/2023 0455 by Melville Stade, RN Outcome: Not Progressing 10/15/2023 0155 by Melville Stade, RN Outcome: Not Progressing Goal: Cardiovascular complication will be avoided 10/15/2023 0455 by Melville Stade, RN Outcome: Not Progressing 10/15/2023 0155 by Melville Stade, RN Outcome: Not Progressing   Problem: Activity: Goal: Risk for activity intolerance will decrease 10/15/2023 0455 by Melville Stade, RN Outcome: Not Progressing 10/15/2023 0155 by Melville Stade, RN Outcome: Not Progressing   Problem: Nutrition: Goal: Adequate nutrition will be maintained 10/15/2023 0455 by Melville Stade, RN Outcome: Not Progressing 10/15/2023 0155 by Melville Stade, RN Outcome: Not Progressing   Problem: Coping: Goal: Level of anxiety will decrease 10/15/2023 0455 by Melville Stade, RN Outcome: Not Progressing 10/15/2023 0155 by Melville Stade, RN Outcome: Not Progressing   Problem: Elimination: Goal: Will not experience complications related to bowel motility 10/15/2023 0455 by Melville Stade, RN Outcome: Not Progressing 10/15/2023 0155 by Melville Stade, RN Outcome: Not Progressing Goal: Will not experience complications related to urinary retention 10/15/2023 0455 by Melville Stade, RN Outcome: Not Progressing 10/15/2023 0155 by Melville Stade, RN Outcome: Not Progressing   Problem: Pain Managment: Goal: General experience of comfort will improve and/or be controlled 10/15/2023 0455 by Melville Stade, RN Outcome: Not Progressing 10/15/2023 0155 by Melville Stade, RN Outcome: Not Progressing   Problem: Safety: Goal: Ability to remain free from injury  will improve 10/15/2023 0455 by Melville Stade, RN Outcome: Not Progressing 10/15/2023 0155 by Melville Stade, RN Outcome: Not Progressing   Problem: Skin Integrity: Goal: Risk for impaired skin integrity will decrease 10/15/2023 0455 by Melville Stade, RN Outcome: Not Progressing 10/15/2023 0155 by Melville Stade, RN Outcome: Not Progressing

## 2023-10-15 NOTE — Progress Notes (Signed)
 PHARMACY - ANTICOAGULATION CONSULT NOTE  Pharmacy Consult for Eliquis  Indication: atrial fibrillation  Allergies  Allergen Reactions   Oysters [Shellfish Allergy] Hives and Swelling   Citrus Hives   Pecan Extract Hives   Apple Juice Other (See Comments)    Sore throat   Farxiga  [Dapagliflozin ] Other (See Comments)    Low blood sugar    Patient Measurements: Weight: 91 kg (200 lb 9.9 oz)  Vital Signs: Temp: 97.5 F (36.4 C) (06/01 1202) Temp Source: Oral (06/01 1202) BP: 168/95 (06/01 1202) Pulse Rate: 60 (06/01 1202)  Labs: Recent Labs    10/13/23 1146 10/14/23 1047 10/15/23 0804 10/15/23 1016  HGB 14.9 15.9  --  16.0  HCT 44.0 48.0  --  47.6  PLT 216 269  --  201  CREATININE 2.33* 1.95* 1.58*  --     Estimated Creatinine Clearance: 36.7 mL/min (A) (by C-G formula based on SCr of 1.58 mg/dL (H)).   Medical History: Past Medical History:  Diagnosis Date   Acute on chronic diastolic congestive heart failure (HCC)    Arthus phenomenon    Atrioventricular block, complete (HCC)    Cardiac pacemaker in situ 07/22/2010   Chronic kidney disease, stage 3 (HCC)    Presumably from longstanding DM & HTN. Scr has gradually increased over the past 2 years (1.6 in 12/2010, 2 in 03/2011, and up to 2.1 in 01/2013)   DIABETES MELLITUS, TYPE I, ADULT ONSET 08/06/2007   DM (diabetes mellitus) (HCC)    adult onset    Essential hypertension, benign 08/06/2007   Very good BP control   History of second degree heart block    HLD (hyperlipidemia)    HYPERLIPIDEMIA 08/06/2007   Hypopotassemia    Morbid obesity (HCC)    Obesity    Other and unspecified angina pectoris    typical   Type I (juvenile type) diabetes mellitus without mention of complication, not stated as uncontrolled    (04/25/13 OV with Dr. Irene Mannheim) = Poorly controlled & is working  w/Dr. Alphia Asa to improve this. Most recent Hgb A1c was 8.2% but Mr. Covelli reports it was down to 7.6 last month.     Assessment: 88YOM with PMH afib on eliquis , complete heart block sp pacemaker, DM2, HTN, HLD, CKD presented with severe acute encephalopathy. Neurology following and workup has been inconclusive thus far with no future procedures currently planned. Pharmacy consulted by neurology to restart PTA Eliquis .  PTA dose 2.5mg  BID Scr. 1.58 trending down from 1.95 (>1.5), Age >80yo, weight >60kg >>> meets 2/3 criteria for decreased dosing (2.5mg  BID) CBC WNL, stable   Plan:  Eliquis  2.5mg  BID PO Monitor CBC and for signs of bleeding Will continue to monitor renal function and adjust dose if needed   Jerrel Mor, PharmD PGY1 Pharmacy Resident 10/15/2023 1:44 PM

## 2023-10-15 NOTE — Progress Notes (Signed)
 Progress Note   Patient: Connor Dixon DOB: [birth year] DOA: 10/10/2023     5 DOS: the patient was seen and examined on 10/15/2023   Brief hospital course: The patient is a 89 yr old man who was brought to the ED via EMS after he was found slumped over in his chair to the right and unresponsive by his Goddaughter who comes over every Tuesday to check on him and help him with the house. Prior to this the last time that the patient was "normal" was in a conversation with his son who lives in [place]  at 1700 on 10/09/2023. He apparently had not taken any of his medications on the evening of 5/26 and had soiled the chair.   The patient's past medical history includes: CHF - chronic Arthus phenomenon Complete AV block, s/p PCM CKD 3b DM I HTN Hyperlipidemia Morbid obesity  In the ED the patient underwent a CT head that demonstrated no acute process. A CTA head and neck demonstrated no LVO. There was however evidence of a remote lacunar infarct of the anterior right corona radiata. I have discussed the patient with Dr. Alecia Ames. There is concern for Seizure, Posterior Circulation CVA, and meningitis. The patient will need to be off of anticoagulation for at least 48 hours. LP is planned for 10/12/2023. The patient will be started on empiric antibiotic coverage for meningitis.   MRI was performed and demonstrated no acute intracranial abnormality. There was evidence for chronic small vessel disease. There was also generalized appearing brain volume loss. EEG is in process. Neurology has evaluated the patient. Plan is for LP on the afternoon of 10/12/2023.   On 10/13/2023 the patient again appears completely obtunded. This may be due to ativan  given for MRI on 10/12/2023, but in any case the patient is not as alert as her was on 10/12/2023.   MRI brain has ruled out CVA.  LP performed on 10/13/2023 has ruled out meningitis. Have added on VDRL of CSF onto studies for the CSF. Antibiotics  for meningitis stopped as per neurology. However, steroids have been continued in case there is an autoimmune component to the patient's illness.  DDx continues to include autoimmune diseases and tertiary syphillis. HIV testing has also been ordered.  10/14/2023 the patient is much improved according to the family at bedside. The patient has cracked a joke with the phlebotomist, eaten some breakfast, and been able to open his phone with his password. He has spoken. At the time of my visit, the patient is certainly more alert. He is non-verbal with me, but does track and attend to me.  Will continue IVIG and steroids. Consult PT/OT.  10/15/2023 Patient still appears improved, but not as interactive as 10/14/2023. Family states that he is just sleepy after eating breakfast.   Assessment and Plan: 89 y.o. male with medical history significant of a.fib on eliquis , complete heart block sp pacemaker, DM2, HTN, HLD, CKD  Admitted for  Acute encephalopathy    Present on Admission:  Acute encephalopathy  Elevated troponin  Essential hypertension  Hyperlipidemia with target LDL less than 70  Paroxysmal atrial fibrillation (HCC)  Sinus node dysfunction (HCC)  Prolonged QT interval     Elevated troponin Troponin stable, could be in the setting of brain injury  Echo in am If abnormal would get cardiology consult Monitor on tele   Essential hypertension Allow permissive HTN   Hyperlipidemia with target LDL less than 70 Not able to tolerate PO statin at this time  Insulin -requiring or dependent type II diabetes mellitus (HCC) Glucoses have been 180-326 in the last 24 hours due to decadron . Will add lantus  20 units daily Will change from sensitive dose SSI to resistant dose SSI HbA1c was 8.3.   Paroxysmal atrial fibrillation (HCC) Hold eliquis  for tonight, pt unable to tolerate PO  Stroke suspected would not be a good candidate for heparin   Sinus node dysfunction (HCC) Sp pacemaker    Prolonged QT interval - will monitor on tele avoid QT prolonging medications, rehydrate correct electrolytes   Acute encephalopathy Concerning for significant brain injury Continuous EEG ordered MRI in a.m. ordered Rehydrate monitor for any reversible causes. Appreciate neurology consult discussed case at length with son At this point they wish for patient to be DNR and resuscitate DO NOT INTUBATE Family is in route Prognosis at this point appears to be poor Will know more once results of MRI are back Palliative care consult PCCM is also aware at this point they feel that patient has no indication for ICU admission On 10/13/2023 the patient again appears completely obtunded. This may be due to ativan  given for MRI on 10/12/2023, but in any case the patient is not as alert as her was on 10/12/2023.  MRI brain has ruled out CVA. LP performed on 10/13/2023 has ruled out meningitis. Have added on VDRL of CSF onto studies for the CSF. Antibiotics for meningitis stopped as per neurology. However, steroids have been continued in case there is an autoimmune component to the patient's illness. VDRL on CSF and RPR have been negative. HIV is negative. Autoimmune work up pending. 10/14/2023 the patient is much improved according to the family at bedside. The patient has cracked a joke with the phlebotomist, eaten some breakfast, and been able to open his phone with his password. He has spoken. At the time of my visit, the patient is certainly more alert. He is non-verbal with me, but does track and attend to me. Not as interactive on 10/15/2023.  Will continue IVIG and steroids. Consult PT/OT.   I have seen and examined this patient myself. I have spent 42 minutes in his evaluation and care including time spent in coordination of care with neurology and discussing patient with family at bedside.   DVT prophylaxis:  SCD      Code Status:  DNR/DNI  as per family  I had personally discussed CODE STATUS with   family  ACP   none    Family Communication:   Family  at  Bedside  plan of care was discussed. Diet  Pending swallow eval.      Subjective: The patient is much more interactive. He makes eye contact and tracks me.   Physical Exam: Vitals:   10/15/23 1527 10/15/23 1539 10/15/23 1558 10/15/23 1616  BP:  (!) 145/96 (!) 160/98   Pulse:  60    Resp:  17    Temp: (!) 97.2 F (36.2 C) 97.9 F (36.6 C) 97.9 F (36.6 C) 97.6 F (36.4 C)  TempSrc:  Axillary Axillary Axillary  SpO2: 99% 99% 99% 99%  Weight:       Exam:  Constitutional:  The patient is much more alert. He is making eye contact and tracking. He is non-verbal for me, although family states that he has been verbal earlier today. No acute distress. Eyes:  pupils and irises appear normal Normal lids and conjunctivae ENMT:  Noted right facial droop. Neck:  neck appears normal, no masses, normal ROM, supple no  thyromegaly Respiratory:  No increased work of breathing. No wheezes, rales, or rhonchi No tactile fremitus Cardiovascular:  Regular rate and rhythm No murmurs, ectopy, or gallups. No lateral PMI. No thrills. Abdomen:  Abdomen is soft, non-tender, non-distended No hernias, masses, or organomegaly Normoactive bowel sounds.  Musculoskeletal:  No cyanosis, clubbing, or edema Skin:  No rashes, lesions, ulcers palpation of skin: no induration or nodules Neurologic:  Right facial droop Non verbal, not following commands Spastic contracture of the right upper extremity Now moving lower extremity bilateral. Appears obtunded. Psychiatric:  Patient is unable to cooperate with exam.   Data Reviewed:  CTA head and neck CT head  Family Communication: Son is at bedside.   Disposition: Status is: Inpatient Remains inpatient appropriate because: DDX includes posterior circulation CVA, Seizure disorder, and meningitis. The patient is at significant risk of further deterioration of organ function and/or  death. Work up is pending including MRI and LP.  Planned Discharge Destination: TBD    Time spent: 30 minutes  Author: Diantha Paxson, DO 10/15/2023 6:39 PM  For on call review [email or web address].

## 2023-10-16 DIAGNOSIS — G9340 Encephalopathy, unspecified: Secondary | ICD-10-CM | POA: Diagnosis not present

## 2023-10-16 DIAGNOSIS — Z515 Encounter for palliative care: Secondary | ICD-10-CM

## 2023-10-16 LAB — GLUCOSE, CAPILLARY
Glucose-Capillary: 144 mg/dL — ABNORMAL HIGH (ref 70–99)
Glucose-Capillary: 116 mg/dL — ABNORMAL HIGH (ref 70–99)
Glucose-Capillary: 129 mg/dL — ABNORMAL HIGH (ref 70–99)
Glucose-Capillary: 166 mg/dL — ABNORMAL HIGH (ref 70–99)
Glucose-Capillary: 168 mg/dL — ABNORMAL HIGH (ref 70–99)
Glucose-Capillary: 99 mg/dL (ref 70–99)

## 2023-10-16 LAB — CSF CULTURE W GRAM STAIN: Culture: NO GROWTH

## 2023-10-16 LAB — RESPIRATORY PANEL BY PCR

## 2023-10-16 LAB — CBC WITH DIFFERENTIAL/PLATELET
Abs Immature Granulocytes: 0.02 10*3/uL (ref 0.00–0.07)
Basophils Absolute: 0 10*3/uL (ref 0.0–0.1)
Basophils Relative: 0 %
Eosinophils Absolute: 0 10*3/uL (ref 0.0–0.5)
Eosinophils Relative: 0 %
HCT: 48.4 % (ref 39.0–52.0)
Hemoglobin: 16.1 g/dL (ref 13.0–17.0)
Immature Granulocytes: 0 %
Lymphocytes Relative: 14 %
Lymphs Abs: 0.8 10*3/uL (ref 0.7–4.0)
MCH: 30.6 pg (ref 26.0–34.0)
MCHC: 33.3 g/dL (ref 30.0–36.0)
MCV: 92 fL (ref 80.0–100.0)
Monocytes Absolute: 0.7 10*3/uL (ref 0.1–1.0)
Monocytes Relative: 12 %
Neutro Abs: 3.8 10*3/uL (ref 1.7–7.7)
Neutrophils Relative %: 74 %
Platelets: 151 10*3/uL (ref 150–400)
RBC: 5.26 MIL/uL (ref 4.22–5.81)
RDW: 14.1 % (ref 11.5–15.5)
WBC: 5.3 10*3/uL (ref 4.0–10.5)
nRBC: 0 % (ref 0.0–0.2)

## 2023-10-16 LAB — OSMOLALITY: Osmolality: 317 mosm/kg — ABNORMAL HIGH (ref 275–295)

## 2023-10-16 LAB — BASIC METABOLIC PANEL WITH GFR
Anion gap: 8 (ref 5–15)
BUN: 44 mg/dL — ABNORMAL HIGH (ref 8–23)
CO2: 19 mmol/L — ABNORMAL LOW (ref 22–32)
Calcium: 7.9 mg/dL — ABNORMAL LOW (ref 8.9–10.3)
Chloride: 107 mmol/L (ref 98–111)
Creatinine, Ser: 1.42 mg/dL — ABNORMAL HIGH (ref 0.61–1.24)
GFR, Estimated: 48 mL/min — ABNORMAL LOW (ref >60)
Glucose, Bld: 135 mg/dL — ABNORMAL HIGH (ref 70–99)
Potassium: 4.1 mmol/L (ref 3.5–5.1)
Sodium: 134 mmol/L — ABNORMAL LOW (ref 135–145)

## 2023-10-16 MED ORDER — THIAMINE HCL 100 MG/ML IJ SOLN
500.0000 mg | Freq: Three times a day (TID) | INTRAVENOUS | Status: AC
  Administered 2023-10-16 – 2023-10-19 (×8): 500 mg via INTRAVENOUS
  Filled 2023-10-16 (×9): qty 5

## 2023-10-16 MED ORDER — AMLODIPINE BESYLATE 5 MG PO TABS
10.0000 mg | ORAL_TABLET | Freq: Every day | ORAL | Status: DC
  Administered 2023-10-16 – 2023-11-08 (×23): 10 mg via ORAL
  Filled 2023-10-16 (×24): qty 2

## 2023-10-16 MED ORDER — METOPROLOL TARTRATE 12.5 MG HALF TABLET
12.5000 mg | ORAL_TABLET | Freq: Two times a day (BID) | ORAL | Status: DC
  Administered 2023-10-16 – 2023-11-08 (×46): 12.5 mg via ORAL
  Filled 2023-10-16 (×46): qty 1

## 2023-10-16 MED ORDER — COSYNTROPIN 0.25 MG IJ SOLR
0.2500 mg | Freq: Once | INTRAMUSCULAR | Status: AC
  Administered 2023-10-17: 0.25 mg via INTRAVENOUS
  Filled 2023-10-16: qty 0.25

## 2023-10-16 NOTE — Progress Notes (Signed)
 NEUROLOGY CONSULT FOLLOW UP NOTE   Date of service: October 16, 2023 Patient Name: Connor Dixon MRN:  [id number] DOB:  06-08-1934  Interval Hx/subjective   Patient slitting up in bed, son at bedside. Today is day 3/5 of IVIG treatment.  He was awake, answered yes to most questions, did say his name and his son's name after multiple repeated asks in a dysarthric voce.   Vitals   Vitals:   10/15/23 2105 10/15/23 2306 10/16/23 0311 10/16/23 0721  BP: (!) 154/79  (!) 160/97 (!) 158/99  Pulse:  60 60 (!) 58  Resp:  14 12 14   Temp:  98 F (36.7 C) 97.7 F (36.5 C) 97.9 F (36.6 C)  TempSrc:  Axillary Axillary Oral  SpO2:  98% 100% 98%  Weight:         Body mass index is 30.5 kg/m.  Physical Exam   Constitutional: Appears elderly  Neurologic Examination    MS: Opens eyes.  Most of his speech is unintelligible, but he does ask his family "how are you" when he realizes that they are in the room. CN: Crosses midline in both directions, blinks to threat bilaterally but does not count fingers, pupils are reactive bilaterally face is symmetric Motor: He moves all extremities spontaneously. Sensory: As above  Medications  Current Facility-Administered Medications:     stroke: early stages of recovery book, , Does not apply, Once, Doutova, Anastassia, MD   acetaminophen  (TYLENOL ) tablet 650 mg, 650 mg, Oral, Q4H PRN, 650 mg at 10/13/23 0555 **OR** acetaminophen  (TYLENOL ) 160 MG/5ML solution 650 mg, 650 mg, Per Tube, Q4H PRN **OR** acetaminophen  (TYLENOL ) suppository 650 mg, 650 mg, Rectal, Q4H PRN, Doutova, Anastassia, MD   apixaban  (ELIQUIS ) tablet 2.5 mg, 2.5 mg, Oral, BID, Swayze, Ava, DO, 2.5 mg at 10/15/23 2106   Chlorhexidine  Gluconate Cloth 2 % PADS 6 each, 6 each, Topical, Daily, Swayze, Ava, DO, 6 each at 10/15/23 1000   feeding supplement (ENSURE PLUS HIGH PROTEIN) liquid 237 mL, 237 mL, Oral, BID BM, Augustin Leber, MD, 237 mL at 10/15/23 1000   hydrALAZINE  (APRESOLINE) tablet 10 mg, 10 mg, Oral, Q8H PRN, Swayze, Ava, DO, 10 mg at 10/15/23 1517   Immune Globulin 10% (PRIVIGEN) IV infusion 35 g, 400 mg/kg, Intravenous, Q24 Hr x 5, Kirkpatrick, Althea Atkinson, MD, Last Rate: 182 mL/hr at 10/15/23 1557, Rate Change at 10/15/23 1614   insulin  aspart (novoLOG ) injection 0-20 Units, 0-20 Units, Subcutaneous, TID WC, Swayze, Ava, DO, 3 Units at 10/16/23 0631   insulin  glargine-yfgn (SEMGLEE ) injection 20 Units, 20 Units, Subcutaneous, QHS, Swayze, Ava, DO, 20 Units at 10/15/23 2216   metoprolol tartrate (LOPRESSOR) tablet 25 mg, 25 mg, Oral, BID, Swayze, Ava, DO, 25 mg at 10/15/23 2106   thiamine (VITAMIN B1) tablet 100 mg, 100 mg, Oral, Daily, Augustin Leber, MD, 100 mg at 10/15/23 1517  Labs and Diagnostic Imaging   Procalcitonin 0.11 pCO2 29 Ammonia less than 13 Creatinine 2.31 with a BUN of 41--improved 6/2 to 44/1.42. TSH 1.002 CK is normal Overnight EEG is normal CSF protein 57 CSF glucose 191 CSF WBC two CSF RBC 47 Negative meningoencephalitis panel including HSV B12, B1 WNL ANA Negative Sjogren's Negative RPR Negative   CSF autoimmune panel to [facility] is pending Serum autoimmune encephalitis panel to [facility] is pending OC bands, IgG index, VDRL is pending  Spoke to [facility] regarding these pending labs 6/2, confirmed that these are being run.    Assessment  Connor Dixon is a 89 y.o. male who presented with severe encephalopathy which is currently of unclear etiology.  He is certainly improving, unclear if this is due to the antibiotics or simply natural history.  No evidence of seizure on prolonged EEG, no evidence of stroke on MRI.  He is improving, possibilities including septic encephalopathy of unclear source with response to antibiotics, delirium of unclear etiology, autoimmune encephalopathy.   Though various encephalopathies are possible, though profound and sudden nature of his presentation makes me think that there is  some other explanation.  Septic encephalopathy typically does not cause flexor posturing which he was doing on arrival.  Despite posturing present, continuous EEG was negative, and his exam did not seem very consistent with seizure on arrival.  Extensive workup for CNS processes has not shown a clear etiology. Patient is showing some improvement. IVIG is now on day 3. Autoimmune labs are pending.    Recommendations   Follow-up autoimmune encephalopathy testing - Continue IVIG day 3/5 - Continue Daily BMP while on IVIG - Repeat brain MRI with and without contrast  - OOB as able - Delirium Precautions - Day/night/sleep interventions   We will continue to follow pending labs.  ______________________________________________________________________   Pt seen by Neuro NP/APP and later by MD. Note/plan to be edited by MD as needed.    Audrene Lease, DNP [facility] Please use AMION for contact information & EPIC for messaging.   Attending Neurohospitalist Addendum Patient seen and examined with APP/Resident. Agree with the history and physical as documented above. Agree with the plan as documented, which I helped formulate. I have edited the note above to reflect my full findings and recommendations. I have independently reviewed the chart, obtained history, review of systems and examined the patient.I have personally reviewed pertinent head/neck/spine imaging (CT/MRI). Please feel free to call with any questions.  I was paged that son at bedside had concerns about IVIG, but when I arrived the other son (the International aid/development worker) stated that he had discussed his brother's concerns with them and that they were ok to proceed with the infusion. Will f/u tmrw.  -- Greg Leaks, MD [facility] [phone]  If 7pm- 7am, please page neurology on call as listed in AMION.

## 2023-10-16 NOTE — Plan of Care (Signed)
  Problem: Fluid Volume: Goal: Ability to maintain a balanced intake and output will improve Outcome: Progressing   Problem: Skin Integrity: Goal: Risk for impaired skin integrity will decrease Outcome: Progressing   Problem: Education: Goal: Ability to describe self-care measures that may prevent or decrease complications (Diabetes Survival Skills Education) will improve Outcome: Not Progressing   Problem: Coping: Goal: Ability to adjust to condition or change in health will improve Outcome: Not Progressing   Problem: Coping: Goal: Will verbalize positive feelings about self Outcome: Not Progressing   Problem: Health Behavior/Discharge Planning: Goal: Ability to manage health-related needs will improve Outcome: Not Progressing   Problem: Self-Care: Goal: Ability to participate in self-care as condition permits will improve Outcome: Not Progressing Goal: Ability to communicate needs accurately will improve Outcome: Not Progressing

## 2023-10-16 NOTE — Plan of Care (Signed)
     Referral previously received for Connor Dixon for goals of care discussion. Noted most recent palliative in-person assessment dated 10/14/2023 at which time it was recommended to continue to follow along for support.  During signout is requested to follow-up after time for outcomes and when CSF/autoimmune labs back.  Chart reviewed for Recent provider notes and labs and updates received from RN.   At this time patient appears stable, labs still pending with no additional information to support ongoing goals of care today.  No plan for in person follow-up today. Please contact the palliative medicine provider on service for any new/urgent needs that require our assistance with this patient.  We will continue to follow along and engage as needed.  Thank you for your referral and allowing PMT to assist in Connor Dixon's care.   Lizbeth Right, NP Palliative Medicine Team Phone: [phone]  NO CHARGE

## 2023-10-16 NOTE — Progress Notes (Addendum)
 Progress Note   Patient: Connor Dixon:[id number] DOB: 1934/11/16 DOA: 10/10/2023     6 DOS: the patient was seen and examined on 10/16/2023   Brief hospital course: The patient is a 89 yr old man who was brought to the ED via EMS after he was found slumped over in his chair to the right and unresponsive by his Goddaughter who comes over every Tuesday to check on him and help him with the house. Prior to this the last time that the patient was "normal" was in a conversation with his son who lives in [place]  at 1700 on 10/09/2023. He apparently had not taken any of his medications on the evening of 5/26 and had soiled the chair.   The patient's past medical history includes: CHF - chronic Arthus phenomenon Complete AV block, s/p PCM CKD 3b DM I HTN Hyperlipidemia Morbid obesity  In the ED the patient underwent a CT head that demonstrated no acute process. A CTA head and neck demonstrated no LVO. There was however evidence of a remote lacunar infarct of the anterior right corona radiata. I have discussed the patient with Dr. Alecia Ames. There is concern for Seizure, Posterior Circulation CVA, and meningitis. The patient will need to be off of anticoagulation for at least 48 hours. LP is planned for 10/12/2023. The patient will be started on empiric antibiotic coverage for meningitis.   MRI was performed and demonstrated no acute intracranial abnormality. There was evidence for chronic small vessel disease. There was also generalized appearing brain volume loss. EEG is in process. Neurology has evaluated the patient. Plan is for LP on the afternoon of 10/12/2023.   On 10/13/2023 the patient again appears completely obtunded. This may be due to ativan  given for MRI on 10/12/2023, but in any case the patient is not as alert as her was on 10/12/2023.   MRI brain has ruled out CVA.  LP performed on 10/13/2023 has ruled out meningitis. Have added on VDRL of CSF onto studies for the CSF. Antibiotics  for meningitis stopped as per neurology. However, steroids have been continued in case there is an autoimmune component to the patient's illness.  DDx continues to include autoimmune diseases and tertiary syphillis. HIV testing has also been ordered.  10/14/2023 the patient is much improved according to the family at bedside. The patient has cracked a joke with the phlebotomist, eaten some breakfast, and been able to open his phone with his password. He has spoken. At the time of my visit, the patient is certainly more alert. He is non-verbal with me, but does track and attend to me.  Will continue IVIG and steroids. Consult PT/OT.  10/15/2023 Patient still appears improved, but not as interactive as 10/14/2023. Family states that he is just sleepy after eating breakfast.   Assessment and Plan: 89 y.o. male with medical history significant of a.fib on eliquis , complete heart block sp pacemaker, DM2, HTN, HLD, CKD  Admitted for  Acute encephalopathy    Present on Admission:  Acute encephalopathy  Elevated troponin  Essential hypertension  Hyperlipidemia with target LDL less than 70  Paroxysmal atrial fibrillation (HCC)  Sinus node dysfunction (HCC)  Prolonged QT interval     Elevated troponin Appears to be demand  Echo unrmarkable  Foley catheter D/c today  Essential hypertension Bp mild/mod elevation - start amlodipine (home losart and coreg  are on hold)   Hyperlipidemia with target LDL less than 70 Not able to tolerate PO statin at this time  Insulin -requiring or dependent type II diabetes mellitus (HCC) Glucose improved Continue basal/bolus/ssi   Paroxysmal atrial fibrillation (HCC) Continue apixaban  Continue metop decrease dose to 12.5 given bradycardia   Sinus node dysfunction (HCC) Sp pacemaker   Prolonged QT interval - will monitor on tele avoid QT prolonging medications, rehydrate correct electrolytes   Acute encephalopathy Concerning for significant brain  injury Continuous EEG ordered, neg MRI nothing acute Rehydrate monitor for any reversible causes. Appreciate neurology consult discussed case at length with son At this point they wish for patient to be DNR and resuscitate DO NOT INTUBATE Family is in route Prognosis at this point appears to be poor Will know more once results of MRI are back Palliative care consult PCCM is also aware at this point they feel that patient has no indication for ICU admission On 10/13/2023 the patient again appears completely obtunded. This may be due to ativan  given for MRI on 10/12/2023, but in any case the patient is not as alert as her was on 10/12/2023.  MRI brain has ruled out CVA. LP performed on 10/13/2023 has ruled out meningitis. Have added on VDRL of CSF onto studies for the CSF. Antibiotics for meningitis stopped as per neurology. However, steroids have been continued in case there is an autoimmune component to the patient's illness. Now off steroids VDRL on CSF and RPR have been negative. HIV is negative. Autoimmune work up pending.  Will check cosyntropin test tomorrow Will query neurology about repeating mri w/ contrast Will continue IVIG per neurology  Will start high-dose thiamine Will repeat osms, initial osm was elevated  Will need snf (toc consulted)        Subjective: opens eyes, nods to every question, doesn't follow commands  Physical Exam: Vitals:   10/16/23 0311 10/16/23 0721 10/16/23 1116 10/16/23 1514  BP: (!) 160/97 (!) 158/99 (!) 164/87 (!) 154/93  Pulse: 60 (!) 58 60 67  Resp: 12 14 14 16   Temp: 97.7 F (36.5 C) 97.9 F (36.6 C) 97.8 F (36.6 C) 98.2 F (36.8 C)  TempSrc: Axillary Oral Axillary Axillary  SpO2: 100% 98% 99% 100%  Weight:       Exam:  Constitutional:    Respiratory:  No increased work of breathing. No wheezes, rales, or rhonch Cardiovascular:  Regular rate and rhythm No murmurs, ectopy, or gallups. Abdomen:  Abdomen is soft, non-tender,  non-distended  Musculoskeletal:  No cyanosis, clubbing, or edema Skin:  No rashes, lesions, ulcers Neurologic:  Somnolent but arouses Moving all 4 but not to command Psychiatric:  Patient is unable to cooperate with exam.   Data Reviewed:  CTA head and neck CT head  Family Communication: Son is at bedside.   Disposition: Status is: Inpatient Remains inpatient appropriate because: ongoing w/u  Planned Discharge Destination: likely snf      Author: Raymonde Calico, MD 10/16/2023 4:06 PM  For on call review [email or web address].

## 2023-10-16 NOTE — Progress Notes (Signed)
 Physical Therapy Treatment Patient Details Name: Connor Dixon MRN: [id number] DOB: 03-Aug-1934 Today's Date: 10/16/2023   History of Present Illness Pt is 89 year old presented to [facility] on  10/10/23 after found at home unresponsive. Pt with dense encephalopathy with flexor positioning of UE's. CT negative for acute changes and MRI pending.  PMH - afib on Eliquis , pacer, HTN, CKD, DM, obesity    PT Comments  Patient up at EOB for about 8 minutes and performed sit to stand trials to [facility] with +2 A and able to clear hips minimally on final trial.  Patient with very limited initiation, helped with oral hygiene with hand over hand and when PT removed hand pt sat with toothbrush in his mouth and did not attempt to finish.  Feel he remains appropriate for inpatient rehab (<3 hours/day) at d/c.     If plan is discharge home, recommend the following: Two people to help with walking and/or transfers;Assistance with cooking/housework;Supervision due to cognitive status;Assist for transportation;Help with stairs or ramp for entrance   Can travel by private vehicle     No  Equipment Recommendations       Recommendations for Other Services       Precautions / Restrictions Precautions Precautions: Fall Recall of Precautions/Restrictions: Impaired     Mobility  Bed Mobility Overal bed mobility: Needs Assistance Bed Mobility: Supine to Sit, Sit to Supine     Supine to sit: Max assist, +2 for physical assistance Sit to supine: Max assist, +2 for physical assistance   General bed mobility comments: assist to initiate and pt helping some to lift trunk, to supine assist for legs onto bed and to lower trunk, +2 to scoot up in bed    Transfers Overall transfer level: Needs assistance     Sit to Stand: Max assist, +2 physical assistance, Via lift equipment, From elevated surface           General transfer comment: +2 for standing attempts at [facility] with A to keep R hand on rail pt kept removing  it and lifting help from hips; after four attempts pt able to clear hips minimally and only briefly Transfer via Lift Equipment: Stedy  Ambulation/Gait                   Stairs             Wheelchair Mobility     Tilt Bed    Modified Rankin (Stroke Patients Only)       Balance Overall balance assessment: Needs assistance Sitting-balance support: Feet supported Sitting balance-Leahy Scale: Poor Sitting balance - Comments: min a to CGA to close S for balance at EOB Postural control: Right lateral lean Standing balance support: Bilateral upper extremity supported Standing balance-Leahy Scale: Zero                              Communication Communication Communication: Impaired Factors Affecting Communication: Difficulty expressing self  Cognition Arousal: Alert Behavior During Therapy: Flat affect   PT - Cognitive impairments: Initiation, Attention, Problem solving                       PT - Cognition Comments: responds to all questions with "yes/okay" and does not follow commands without multimodal cues and help for initiation Following commands: Impaired Following commands impaired: Follows one step commands inconsistently, Follows one step commands with increased time    Cueing  Cueing Techniques: Verbal cues, Gestural cues, Tactile cues, Visual cues  Exercises      General Comments General comments (skin integrity, edema, etc.): family at the bedside and attentive (son, daughter in law and friend); pt intiating to eat lunch but paused to work with therapy; assisted with hand over hand to total A for oral hygiene while at EOB in prep to eat lunch when finished with PT      Pertinent Vitals/Pain Pain Assessment Pain Assessment: Faces Faces Pain Scale: No hurt    Home Living                          Prior Function            PT Goals (current goals can now be found in the care plan section) Progress towards PT  goals: Progressing toward goals    Frequency    Min 2X/week      PT Plan      Co-evaluation              AM-PAC PT "6 Clicks" Mobility   Outcome Measure  Help needed turning from your back to your side while in a flat bed without using bedrails?: A Lot Help needed moving from lying on your back to sitting on the side of a flat bed without using bedrails?: A Lot Help needed moving to and from a bed to a chair (including a wheelchair)?: Total Help needed standing up from a chair using your arms (e.g., wheelchair or bedside chair)?: Total Help needed to walk in hospital room?: Total Help needed climbing 3-5 steps with a railing? : Total 6 Click Score: 8    End of Session Equipment Utilized During Treatment: Gait belt Activity Tolerance: Patient tolerated treatment well Patient left: in bed;with call bell/phone within reach;with nursing/sitter in room;with family/visitor present   PT Visit Diagnosis: Other abnormalities of gait and mobility (R26.89);Other symptoms and signs involving the nervous system (R29.898);Muscle weakness (generalized) (M62.81)     Time: 8119-1478 PT Time Calculation (min) (ACUTE ONLY): 21 min  Charges:    $Therapeutic Activity: 8-22 mins PT General Charges $$ ACUTE PT VISIT: 1 Visit                     [name], PT Acute Rehabilitation Services Office:[phone] 10/16/2023    [name] 10/16/2023, 1:31 PM

## 2023-10-16 NOTE — Plan of Care (Signed)
 Problem: Education: Goal: Ability to describe self-care measures that may prevent or decrease complications (Diabetes Survival Skills Education) will improve 10/16/2023 0355 by Melville Stade, RN Outcome: Not Progressing 10/16/2023 0354 by Melville Stade, RN Outcome: Not Progressing Goal: Individualized Educational Video(s) 10/16/2023 0355 by Melville Stade, RN Outcome: Not Progressing 10/16/2023 0354 by Melville Stade, RN Outcome: Not Progressing   Problem: Coping: Goal: Ability to adjust to condition or change in health will improve 10/16/2023 0355 by Melville Stade, RN Outcome: Not Progressing 10/16/2023 0354 by Melville Stade, RN Outcome: Not Progressing   Problem: Fluid Volume: Goal: Ability to maintain a balanced intake and output will improve 10/16/2023 0355 by Melville Stade, RN Outcome: Not Progressing 10/16/2023 0354 by Melville Stade, RN Outcome: Progressing   Problem: Health Behavior/Discharge Planning: Goal: Ability to identify and utilize available resources and services will improve 10/16/2023 0355 by Melville Stade, RN Outcome: Not Progressing 10/16/2023 0354 by Melville Stade, RN Outcome: Not Progressing Goal: Ability to manage health-related needs will improve 10/16/2023 0355 by Melville Stade, RN Outcome: Not Progressing 10/16/2023 0354 by Melville Stade, RN Outcome: Not Progressing   Problem: Metabolic: Goal: Ability to maintain appropriate glucose levels will improve 10/16/2023 0355 by Melville Stade, RN Outcome: Not Progressing 10/16/2023 0354 by Melville Stade, RN Outcome: Not Progressing   Problem: Nutritional: Goal: Maintenance of adequate nutrition will improve 10/16/2023 0355 by Melville Stade, RN Outcome: Not Progressing 10/16/2023 0354 by Melville Stade, RN Outcome: Progressing Goal: Progress toward achieving an optimal weight will improve 10/16/2023 0355 by Melville Stade, RN Outcome: Not Progressing 10/16/2023 0354 by Melville Stade, RN Outcome: Not Progressing    Problem: Skin Integrity: Goal: Risk for impaired skin integrity will decrease 10/16/2023 0355 by Melville Stade, RN Outcome: Not Progressing 10/16/2023 0354 by Melville Stade, RN Outcome: Progressing   Problem: Tissue Perfusion: Goal: Adequacy of tissue perfusion will improve 10/16/2023 0355 by Melville Stade, RN Outcome: Not Progressing 10/16/2023 0354 by Melville Stade, RN Outcome: Progressing   Problem: Education: Goal: Knowledge of disease or condition will improve 10/16/2023 0355 by Melville Stade, RN Outcome: Not Progressing 10/16/2023 0354 by Melville Stade, RN Outcome: Not Progressing Goal: Knowledge of secondary prevention will improve (MUST DOCUMENT ALL) 10/16/2023 0355 by Melville Stade, RN Outcome: Not Progressing 10/16/2023 0354 by Melville Stade, RN Outcome: Not Progressing Goal: Knowledge of patient specific risk factors will improve (DELETE if not current risk factor) 10/16/2023 0355 by Melville Stade, RN Outcome: Not Progressing 10/16/2023 0354 by Melville Stade, RN Outcome: Not Progressing   Problem: Ischemic Stroke/TIA Tissue Perfusion: Goal: Complications of ischemic stroke/TIA will be minimized 10/16/2023 0355 by Melville Stade, RN Outcome: Not Progressing 10/16/2023 0354 by Melville Stade, RN Outcome: Progressing   Problem: Coping: Goal: Will verbalize positive feelings about self 10/16/2023 0355 by Melville Stade, RN Outcome: Not Progressing 10/16/2023 0354 by Melville Stade, RN Outcome: Not Progressing Goal: Will identify appropriate support needs 10/16/2023 0355 by Melville Stade, RN Outcome: Not Progressing 10/16/2023 0354 by Melville Stade, RN Outcome: Not Progressing   Problem: Health Behavior/Discharge Planning: Goal: Ability to manage health-related needs will improve 10/16/2023 0355 by Melville Stade, RN Outcome: Not Progressing 10/16/2023 0354 by Melville Stade, RN Outcome: Not Progressing Goal: Goals will be collaboratively established with  patient/family 10/16/2023 0355 by Melville Stade, RN Outcome: Not Progressing 10/16/2023 0354 by Melville Stade, RN Outcome: Not Progressing   Problem: Self-Care: Goal: Ability to participate in self-care as condition permits will improve 10/16/2023 0355 by Melville Stade, RN Outcome: Not Progressing 10/16/2023 0354 by Melville Stade, RN  Outcome: Not Progressing Goal: Verbalization of feelings and concerns over difficulty with self-care will improve 10/16/2023 0355 by Melville Stade, RN Outcome: Not Progressing 10/16/2023 0354 by Melville Stade, RN Outcome: Not Progressing Goal: Ability to communicate needs accurately will improve 10/16/2023 0355 by Melville Stade, RN Outcome: Not Progressing 10/16/2023 0354 by Melville Stade, RN Outcome: Not Progressing   Problem: Nutrition: Goal: Risk of aspiration will decrease 10/16/2023 0355 by Melville Stade, RN Outcome: Not Progressing 10/16/2023 0354 by Melville Stade, RN Outcome: Not Progressing Goal: Dietary intake will improve 10/16/2023 0355 by Melville Stade, RN Outcome: Not Progressing 10/16/2023 0354 by Melville Stade, RN Outcome: Not Progressing   Problem: Education: Goal: Knowledge of General Education information will improve Description: Including pain rating scale, medication(s)/side effects and non-pharmacologic comfort measures 10/16/2023 0355 by Melville Stade, RN Outcome: Not Progressing 10/16/2023 0354 by Melville Stade, RN Outcome: Not Progressing   Problem: Health Behavior/Discharge Planning: Goal: Ability to manage health-related needs will improve 10/16/2023 0355 by Melville Stade, RN Outcome: Not Progressing 10/16/2023 0354 by Melville Stade, RN Outcome: Not Progressing   Problem: Clinical Measurements: Goal: Ability to maintain clinical measurements within normal limits will improve 10/16/2023 0355 by Melville Stade, RN Outcome: Not Progressing 10/16/2023 0354 by Melville Stade, RN Outcome: Not Progressing Goal: Will remain free  from infection 10/16/2023 0355 by Melville Stade, RN Outcome: Not Progressing 10/16/2023 0354 by Melville Stade, RN Outcome: Not Progressing Goal: Diagnostic test results will improve 10/16/2023 0355 by Melville Stade, RN Outcome: Not Progressing 10/16/2023 0354 by Melville Stade, RN Outcome: Not Progressing Goal: Respiratory complications will improve 10/16/2023 0355 by Melville Stade, RN Outcome: Not Progressing 10/16/2023 0354 by Melville Stade, RN Outcome: Not Progressing Goal: Cardiovascular complication will be avoided 10/16/2023 0355 by Melville Stade, RN Outcome: Not Progressing 10/16/2023 0354 by Melville Stade, RN Outcome: Not Progressing   Problem: Activity: Goal: Risk for activity intolerance will decrease 10/16/2023 0355 by Melville Stade, RN Outcome: Not Progressing 10/16/2023 0354 by Melville Stade, RN Outcome: Not Progressing   Problem: Nutrition: Goal: Adequate nutrition will be maintained 10/16/2023 0355 by Melville Stade, RN Outcome: Not Progressing 10/16/2023 0354 by Melville Stade, RN Outcome: Not Progressing   Problem: Coping: Goal: Level of anxiety will decrease 10/16/2023 0355 by Melville Stade, RN Outcome: Not Progressing 10/16/2023 0354 by Melville Stade, RN Outcome: Not Progressing   Problem: Elimination: Goal: Will not experience complications related to bowel motility 10/16/2023 0355 by Melville Stade, RN Outcome: Not Progressing 10/16/2023 0354 by Melville Stade, RN Outcome: Not Progressing Goal: Will not experience complications related to urinary retention 10/16/2023 0355 by Melville Stade, RN Outcome: Not Progressing 10/16/2023 0354 by Melville Stade, RN Outcome: Not Progressing   Problem: Pain Managment: Goal: General experience of comfort will improve and/or be controlled 10/16/2023 0355 by Melville Stade, RN Outcome: Not Progressing 10/16/2023 0354 by Melville Stade, RN Outcome: Not Progressing   Problem: Safety: Goal: Ability to remain free from injury will  improve 10/16/2023 0355 by Melville Stade, RN Outcome: Not Progressing 10/16/2023 0354 by Melville Stade, RN Outcome: Not Progressing   Problem: Skin Integrity: Goal: Risk for impaired skin integrity will decrease 10/16/2023 0355 by Melville Stade, RN Outcome: Not Progressing 10/16/2023 0354 by Melville Stade, RN Outcome: Not Progressing

## 2023-10-16 NOTE — Plan of Care (Signed)
  Problem: Fluid Volume: Goal: Ability to maintain a balanced intake and output will improve Outcome: Progressing   Problem: Nutritional: Goal: Maintenance of adequate nutrition will improve Outcome: Progressing   Problem: Skin Integrity: Goal: Risk for impaired skin integrity will decrease Outcome: Progressing   Problem: Tissue Perfusion: Goal: Adequacy of tissue perfusion will improve Outcome: Progressing   Problem: Ischemic Stroke/TIA Tissue Perfusion: Goal: Complications of ischemic stroke/TIA will be minimized Outcome: Progressing

## 2023-10-17 ENCOUNTER — Inpatient Hospital Stay (HOSPITAL_COMMUNITY)

## 2023-10-17 DIAGNOSIS — Z515 Encounter for palliative care: Secondary | ICD-10-CM | POA: Diagnosis not present

## 2023-10-17 DIAGNOSIS — Z789 Other specified health status: Secondary | ICD-10-CM

## 2023-10-17 DIAGNOSIS — R9082 White matter disease, unspecified: Secondary | ICD-10-CM | POA: Diagnosis not present

## 2023-10-17 DIAGNOSIS — G9340 Encephalopathy, unspecified: Secondary | ICD-10-CM | POA: Diagnosis not present

## 2023-10-17 DIAGNOSIS — E119 Type 2 diabetes mellitus without complications: Secondary | ICD-10-CM

## 2023-10-17 DIAGNOSIS — R4182 Altered mental status, unspecified: Secondary | ICD-10-CM | POA: Diagnosis not present

## 2023-10-17 DIAGNOSIS — I639 Cerebral infarction, unspecified: Secondary | ICD-10-CM | POA: Diagnosis not present

## 2023-10-17 DIAGNOSIS — Z66 Do not resuscitate: Secondary | ICD-10-CM

## 2023-10-17 DIAGNOSIS — Z7189 Other specified counseling: Secondary | ICD-10-CM | POA: Diagnosis not present

## 2023-10-17 DIAGNOSIS — Z794 Long term (current) use of insulin: Secondary | ICD-10-CM

## 2023-10-17 LAB — MISC LABCORP TEST (SEND OUT): Labcorp test code: 505535

## 2023-10-17 LAB — BASIC METABOLIC PANEL WITH GFR
Anion gap: 4 — ABNORMAL LOW (ref 5–15)
BUN: 36 mg/dL — ABNORMAL HIGH (ref 8–23)
CO2: 26 mmol/L (ref 22–32)
Calcium: 7.9 mg/dL — ABNORMAL LOW (ref 8.9–10.3)
Chloride: 104 mmol/L (ref 98–111)
Creatinine, Ser: 1.49 mg/dL — ABNORMAL HIGH (ref 0.61–1.24)
GFR, Estimated: 45 mL/min — ABNORMAL LOW (ref >60)
Glucose, Bld: 88 mg/dL (ref 70–99)
Potassium: 3.5 mmol/L (ref 3.5–5.1)
Sodium: 134 mmol/L — ABNORMAL LOW (ref 135–145)

## 2023-10-17 LAB — ACTH STIMULATION, 3 TIME POINTS
Cortisol, 30 Min: 17.4 ug/dL
Cortisol, 60 Min: 18.1 ug/dL
Cortisol, Base: 8.1 ug/dL

## 2023-10-17 LAB — GLUCOSE, CAPILLARY
Glucose-Capillary: 101 mg/dL — ABNORMAL HIGH (ref 70–99)
Glucose-Capillary: 157 mg/dL — ABNORMAL HIGH (ref 70–99)
Glucose-Capillary: 141 mg/dL — ABNORMAL HIGH (ref 70–99)
Glucose-Capillary: 194 mg/dL — ABNORMAL HIGH (ref 70–99)

## 2023-10-17 LAB — IGG CSF INDEX
Albumin CSF-mCnc: 44 mg/dL (ref 15–55)
Albumin: 3.5 g/dL — ABNORMAL LOW (ref 3.7–4.7)
CSF IgG Index: 0.6 (ref 0.0–0.7)
IgG (Immunoglobin G), Serum: 1086 mg/dL (ref 603–1613)
IgG, CSF: 8.2 mg/dL (ref 0.0–10.3)
IgG/Alb Ratio, CSF: 0.19 (ref 0.00–0.25)

## 2023-10-17 LAB — VDRL, CSF: VDRL Quant, CSF: NONREACTIVE

## 2023-10-17 LAB — ETHANOL: Alcohol, Ethyl (B): <15 mg/dL (ref <15)

## 2023-10-17 MED ORDER — GADOBUTROL 1 MMOL/ML IV SOLN
9.0000 mL | Freq: Once | INTRAVENOUS | Status: AC | PRN
  Administered 2023-10-17: 9 mL via INTRAVENOUS

## 2023-10-17 MED ORDER — LORAZEPAM 2 MG/ML IJ SOLN
2.0000 mg | Freq: Once | INTRAMUSCULAR | Status: DC | PRN
  Filled 2023-10-17: qty 1

## 2023-10-17 MED ORDER — BOOST PLUS PO LIQD
237.0000 mL | Freq: Two times a day (BID) | ORAL | Status: DC
  Administered 2023-10-18 – 2023-10-26 (×14): 237 mL via ORAL
  Filled 2023-10-17 (×18): qty 237

## 2023-10-17 NOTE — TOC Progression Note (Addendum)
 Transition of Care [facility]) - Progression Note    Patient Details  Name: Connor Dixon MRN: [id number] Date of Birth: November 15, 1934  [facility] CM/SW Contact  Tandy Fam, [place] Phone Number: 10/17/2023, 3:23 PM  Clinical Narrative:   CSW met with patient's family at bedside to discuss recommendation for SNF. Family in agreement, but also concerned because there is still no answer as to what's causing the patient's condition. CSW validated family's concern, but also discussed wanting to give them adequate time to research rehab options in the meantime. CSW completed referral and faxed out, will provide bed offers to family.  UPDATE: CSW provided bed offers to son at bedside. CSW to follow.    Expected Discharge Plan: Skilled Nursing Facility Barriers to Discharge: Insurance Authorization  Expected Discharge Plan and Services                                               Social Determinants of Health (SDOH) Interventions SDOH Screenings   Food Insecurity: Patient Unable To Answer (10/13/2023)  Housing: Patient Unable To Answer (10/13/2023)  Transportation Needs: Patient Unable To Answer (10/13/2023)  Utilities: Patient Unable To Answer (10/13/2023)  Alcohol Screen: Low Risk  (04/06/2023)  Depression (PHQ2-9): Low Risk  (09/06/2023)  Financial Resource Strain: Low Risk  (04/06/2023)  Physical Activity: Inactive (04/06/2023)  Social Connections: Unknown (10/13/2023)  Stress: No Stress Concern Present (04/06/2023)  Tobacco Use: Low Risk  (10/10/2023)  Health Literacy: Adequate Health Literacy (04/06/2023)    Readmission Risk Interventions     No data to display

## 2023-10-17 NOTE — Progress Notes (Signed)
 Daily Progress Note   Date: 10/17/2023   Patient Name: Connor Dixon  DOB: 02/20/1935  MRN: [id number]  Age / Sex: 89 y.o., male  Attending Physician: Connor Mealy, MD Primary Care Physician: Connor Knuckles, MD Admit Date: 10/10/2023 Length of Stay: 7 days  Reason for Consultation: Establishing goals of care  Past Medical History:  Diagnosis Date   Acute on chronic diastolic congestive heart failure (HCC)    Arthus phenomenon    Atrioventricular block, complete (HCC)    Cardiac pacemaker in situ 07/22/2010   Chronic kidney disease, stage 3 (HCC)    Presumably from longstanding DM & HTN. Scr has gradually increased over the past 2 years (1.6 in 12/2010, 2 in 03/2011, and up to 2.1 in 01/2013)   DIABETES MELLITUS, TYPE I, ADULT ONSET 08/06/2007   DM (diabetes mellitus) (HCC)    adult onset    Essential hypertension, benign 08/06/2007   Very good BP control   History of second degree heart block    HLD (hyperlipidemia)    HYPERLIPIDEMIA 08/06/2007   Hypopotassemia    Morbid obesity (HCC)    Obesity    Other and unspecified angina pectoris    typical   Type I (juvenile type) diabetes mellitus without mention of complication, not stated as uncontrolled    (04/25/13 OV with Dr. Irene Dixon) = Poorly controlled & is working  w/Dr. Alphia Dixon to improve this. Most recent Hgb A1c was 8.2% but [name] reports it was down to 7.6 last month.    Subjective:   Subjective: Chart Reviewed. Updates received. Patient Assessed. Created space and opportunity for patient  and family to explore thoughts and feelings regarding current medical situation.  Today's Discussion: Today before meeting with the patient/family, I reviewed the chart including neurology notes from today and yesterday, SLP note from today, PT note from yesterday.  Some autoimmune labs still pending, BMP shows stable/improved creatinine over the past week at 1.49 today.  Overall, it seems like the patient has been slowly  improving.  Today I went to the bedside to see the patient.  2 sons and another family member were present.  The patient had a period tray at the bedside and they were taking time feeding him.  He was doing quite well, nodding his head and using his eyebrows to indicate desire for another bite.  He did eat approximately 50% by the time I left, and was still eating.  He also excepted a cup and straw and held in his hand, use a straw to drink.  He is not communicating verbally at this time but continues to communicate expressively and with nods.  Family at the bedside and we had a discussion about his improvement of several days.  We decided that we would continue current scope of care.  They are encouraged by some improvement and hopeful for.  They note that tomorrow is the last day of IVIG.  They also discussed the planned MRI head with contrast which I hope will shed some more light on the situation.  We agreed to continue DNR-limited, continue current scope of care, more time for outcomes and hopeful for improvement.  I shared that I would come back in a couple days to check on the patient and see how he is progressing.  We are available for any needs before that and confirm they have our contact information. I provided emotional and general support through therapeutic listening, empathy, sharing of stories, and other techniques. I answered  all questions and addressed all concerns to the best of my ability.   Review of Systems  Unable to perform ROS   Objective:   Primary Diagnoses: Present on Admission:  Acute encephalopathy  Elevated troponin  Essential hypertension  Hyperlipidemia with target LDL less than 70  Paroxysmal atrial fibrillation (HCC)  Sinus node dysfunction (HCC)  Prolonged QT interval   Vital Signs:  BP (!) (P) 160/83   Pulse (P) 67   Temp (!) (P) 97.5 F (36.4 C) (Axillary)   Resp (P) 18   Wt 91 kg   SpO2 (P) 99%   BMI 30.50 kg/m   Physical Exam Vitals and  nursing note reviewed.  Constitutional:      General: He is not in acute distress.    Appearance: He is ill-appearing. He is not toxic-appearing.  HENT:     Head: Normocephalic and atraumatic.  Pulmonary:     Effort: Pulmonary effort is normal. No respiratory distress.  Abdominal:     General: Abdomen is flat.  Skin:    General: Skin is warm and dry.  Neurological:     Mental Status: He is alert.     Comments: Nonverbal, but communicating somewhat with expressions  Psychiatric:        Mood and Affect: Mood normal.        Behavior: Behavior normal.     Palliative Assessment/Data: 40%   Existing Vynca/ACP Documentation: None  Assessment & Plan:   HPI/Patient Profile:  89 y.o. male  with past medical history of A-fib (Eliquis ), s/p PPM for second-degree heart block, HTN, CKD (stage III), type 1 diabetes, morbid obesity, and HLD admitted on 10/10/2023 after being found down at home.  Last known well was 5/26 at 1700.   EGD revealed moderate diffuse encephalopathy with no seizures or epileptiform discharges noted.  Overnight EEG results pending.  Pending.  Workup remains in progress.  CT of the head revealed remote lacunar infarct in the anterior right corona radiata.  CTA revealed no evidence of significant stenosis, aneurysmal dilatation, or dissection involving the arteries of the head and neck. MRI WNL. LP to be performed today.    PMT was consulted to support patient and family with goals of care discussions.  SUMMARY OF RECOMMENDATIONS   DNR-limited Continue current scope of care Family remains hopeful for improvement Ongoing supportive patient and family Palliative medicine will follow-up in a couple days Please notify us  of any significant clinical change or new palliative needs in the interim  Symptom Management:  Per primary team PMT is available to assist as needed  Code Status: DNR-limited  Prognosis: Unable to determine  Discharge Planning: To Be  Determined  Discussed with: Patient's family, medical team, nursing team  Thank you for allowing us  to participate in the care of Connor Dixon PMT will continue to support holistically.  Billing based on MDM: Moderate  Detailed review of medical records (labs, imaging, vital signs), medically appropriate exam, discussed with treatment team, counseling and education to patient, family, & staff, documenting clinical information, medication management, coordination of care  Lizbeth Right, NP Palliative Medicine Team  Team Phone # [phone] (Nights/Weekends)  01/12/2021, 8:17 AM

## 2023-10-17 NOTE — Progress Notes (Signed)
 Speech Language Pathology Treatment: Dysphagia;Cognitive-Linquistic  Patient Details Name: Connor Dixon MRN: [id number] DOB: [birth year] Today's Date: 10/17/2023 Time: 8938-1017 SLP Time Calculation (min) (ACUTE ONLY): 20 min  Assessment / Plan / Recommendation Clinical Impression  Skilled therapy session focused on dysphagia and communication goals. Patient fatigued upon entrance, however willing to consume PO (opened mouth for puree/thin liquids via straw - limited verbalizations). Patient with complete oral clearance of puree and no s/sx of aspiration with large sips of thin liquid via straw. Patients family reports great success with D1/thin liquid diet given full supervision. Recommend continuation of current diet with medications crushed in puree. Patient should follow strict aspiration precautions including small bites/sips, slow rate, lingual sweep and liquid wash. ST to continue to follow for diet checks/upgrades.   SLP targeted communication goals through verbalizing simple, single step directions. Patient required hand over hand A to give thumbs up/thumbs down and point to yes/no communication board provided by SLP. Patient with repetitive verbalization of "yeah" however no other vocabulary used. Patient then becoming very fidgety and unable to attend to further ST intervention. SLP educated patients family on attempting to use communication board and gestures. SLP encouraged family to utilize simple phrases when communicating with patient. Patients family verbalized understanding with no further questions.    HPI HPI: Pt is 89 year old presented to [facility] on  10/10/23 after found at home unresponsive. Pt with dense encephalopathy with flexor positioning of UE's. CT negative for acute changes and MRI reports "No acute intracranial abnormality by noncontrast MRI.  2. Signal changes compatible with chronic small vessel disease. Generalized appearing brain volume loss."  PMH - afib on Eliquis , pacer,  HTN, CKD, DM, obesity      SLP Plan  Continue with current plan of care      Recommendations for follow up therapy are one component of a multi-disciplinary discharge planning process, led by the attending physician.  Recommendations may be updated based on patient status, additional functional criteria and insurance authorization.    Recommendations  Diet recommendations: Dysphagia 1 (puree);Thin liquid Liquids provided via: Straw Medication Administration: Crushed with puree Supervision: Full supervision/cueing for compensatory strategies;Trained caregiver to feed patient Compensations: Minimize environmental distractions;Slow rate;Small sips/bites;Lingual sweep for clearance of pocketing Postural Changes and/or Swallow Maneuvers: Seated upright 90 degrees        Oral care BID;Oral care before and after PO   Frequent or constant Supervision/Assistance Dysphagia, unspecified (R13.10);Cognitive communication deficit (R41.841)     Continue with current plan of care     Liora Myles M.A., CCC-SLP 10/17/2023, 10:17 AM

## 2023-10-17 NOTE — Care Management Important Message (Signed)
 Important Message  Patient Details  Name: Connor Dixon MRN: [id number] Date of Birth: Jul 18, 1934   Important Message Given:  Yes - Medicare IM     Wynonia Hedges 10/17/2023, 3:08 PM

## 2023-10-17 NOTE — Plan of Care (Signed)
  Problem: Skin Integrity: Goal: Risk for impaired skin integrity will decrease Outcome: Progressing   Problem: Nutritional: Goal: Maintenance of adequate nutrition will improve Outcome: Progressing Goal: Progress toward achieving an optimal weight will improve Outcome: Progressing   Problem: Tissue Perfusion: Goal: Adequacy of tissue perfusion will improve Outcome: Progressing   Problem: Clinical Measurements: Goal: Ability to maintain clinical measurements within normal limits will improve Outcome: Progressing Goal: Will remain free from infection Outcome: Progressing Goal: Diagnostic test results will improve Outcome: Progressing Goal: Respiratory complications will improve Outcome: Progressing Goal: Cardiovascular complication will be avoided Outcome: Progressing   Problem: Elimination: Goal: Will not experience complications related to bowel motility Outcome: Progressing Goal: Will not experience complications related to urinary retention Outcome: Progressing   Problem: Pain Managment: Goal: General experience of comfort will improve and/or be controlled Outcome: Progressing   Problem: Safety: Goal: Ability to remain free from injury will improve Outcome: Progressing   Problem: Skin Integrity: Goal: Risk for impaired skin integrity will decrease Outcome: Progressing

## 2023-10-17 NOTE — Plan of Care (Signed)
  Problem: Fluid Volume: Goal: Ability to maintain a balanced intake and output will improve Outcome: Progressing   Problem: Clinical Measurements: Goal: Respiratory complications will improve Outcome: Progressing   Problem: Elimination: Goal: Will not experience complications related to bowel motility Outcome: Progressing Goal: Will not experience complications related to urinary retention Outcome: Progressing   Problem: Education: Goal: Ability to describe self-care measures that may prevent or decrease complications (Diabetes Survival Skills Education) will improve Outcome: Not Progressing   Problem: Coping: Goal: Ability to adjust to condition or change in health will improve Outcome: Not Progressing   Problem: Self-Care: Goal: Ability to participate in self-care as condition permits will improve Outcome: Not Progressing Goal: Ability to communicate needs accurately will improve Outcome: Not Progressing   Problem: Education: Goal: Knowledge of General Education information will improve Description: Including pain rating scale, medication(s)/side effects and non-pharmacologic comfort measures Outcome: Not Progressing

## 2023-10-17 NOTE — Progress Notes (Addendum)
 Progress Note   Patient: Connor Dixon ZOX:[id number] DOB: 1935/04/28 DOA: 10/10/2023     7 DOS: the patient was seen and examined on 10/17/2023   Brief hospital course: The patient is a 89 yr old man who was brought to the ED via EMS after he was found slumped over in his chair to the right and unresponsive by his Goddaughter who comes over every Tuesday to check on him and help him with the house. Prior to this the last time that the patient was "normal" was in a conversation with his son who lives in [place]  at 1700 on 10/09/2023. He apparently had not taken any of his medications on the evening of 5/26 and had soiled the chair.   The patient's past medical history includes: CHF - chronic Arthus phenomenon Complete AV block, s/p PCM CKD 3b DM I HTN Hyperlipidemia Morbid obesity  In the ED the patient underwent a CT head that demonstrated no acute process. A CTA head and neck demonstrated no LVO. There was however evidence of a remote lacunar infarct of the anterior right corona radiata. I have discussed the patient with Dr. Alecia Ames. There is concern for Seizure, Posterior Circulation CVA, and meningitis. The patient will need to be off of anticoagulation for at least 48 hours. LP is planned for 10/12/2023. The patient will be started on empiric antibiotic coverage for meningitis.   MRI was performed and demonstrated no acute intracranial abnormality. There was evidence for chronic small vessel disease. There was also generalized appearing brain volume loss. EEG is in process. Neurology has evaluated the patient. Plan is for LP on the afternoon of 10/12/2023.   On 10/13/2023 the patient again appears completely obtunded. This may be due to ativan  given for MRI on 10/12/2023, but in any case the patient is not as alert as her was on 10/12/2023.   MRI brain has ruled out CVA.  LP performed on 10/13/2023 has ruled out meningitis. Have added on VDRL of CSF onto studies for the CSF. Antibiotics  for meningitis stopped as per neurology. However, steroids have been continued in case there is an autoimmune component to the patient's illness.  DDx continues to include autoimmune diseases and tertiary syphillis. HIV testing has also been ordered.  10/14/2023 the patient is much improved according to the family at bedside. The patient has cracked a joke with the phlebotomist, eaten some breakfast, and been able to open his phone with his password. He has spoken. At the time of my visit, the patient is certainly more alert. He is non-verbal with me, but does track and attend to me.  Will continue IVIG and steroids. Consult PT/OT.  10/15/2023 Patient still appears improved, but not as interactive as 10/14/2023. Family states that he is just sleepy after eating breakfast.   Assessment and Plan: 89 y.o. male with medical history significant of a.fib on eliquis , complete heart block sp pacemaker, DM2, HTN, HLD, CKD  Admitted for  Acute encephalopathy    Acute encephalopathy MRI no stroke. EEG no seizure like activity. LP no meningitis. Neurology thinks possible autoimmune encephalitis, receiving treatment for that (ivig). Osms remain elevated with osmolar gap, son thinks toxic alcohol ingestion very unlikely, no dka does have some kidney dysfunction, lipids normal, no lab evidence multiple myeloma. Cosyntropin test neg. - f/u mri with contrast - continue high dose thiamine - continue ivig per neurology - TOC consulted for snf  Elevated serum osm gap Etiology unclear. Not acidotic. Family thinks toxic alcohol ingestion unlikely.  Discussed with dr. Arno Bibles of hematology today, would be abnormal for paraproteinemia to present with purely encephalopathy, he advises following labs, which I've ordered. - kappa/lambda light chain ratio, SPEP, and immunoglobulin levels.    Elevated troponin Appears to be demand  Echo unrmarkable  Foley catheter Discontinued 6/2  Essential hypertension Bp mild/mod  elevation - started amlodipine (home losart and coreg  are on hold)   Hyperlipidemia with target LDL less than 70 Not able to tolerate PO statin at this time   Insulin -requiring or dependent type II diabetes mellitus (HCC) Glucose improved Continue basal/bolus/ssi   Paroxysmal atrial fibrillation (HCC) Continue apixaban  Continue metop     Sinus node dysfunction (HCC) Sp pacemaker   Prolonged QT interval - will monitor on tele avoid QT prolonging medications, rehydrate correct electrolytes          Subjective: opens eyes today, follows some directions, ate most of his lunch  Physical Exam: Vitals:   10/17/23 1347 10/17/23 1405 10/17/23 1420 10/17/23 1505  BP: (!) 175/89 (!) 160/83 (!) (P) 160/83 (!) 145/70  Pulse: 72 62 (P) 67 66  Resp: 18 18 (P) 18 18  Temp: 97.6 F (36.4 C) (!) 97.4 F (36.3 C) (!) (P) 97.5 F (36.4 C) 97.7 F (36.5 C)  TempSrc: Axillary Axillary (P) Axillary Axillary  SpO2: 99% 100% (P) 99% 99%  Weight:       Exam:  Constitutional:    Respiratory:  No increased work of breathing. No wheezes, rales, or rhonch Cardiovascular:  Regular rate and rhythm  Abdomen:  Abdomen is soft, non-tender, non-distended  Musculoskeletal:  No cyanosis, clubbing, or edema Skin:  No rashes, lesions, ulcers Neurologic:  Somnolent but arouses Moving all 4 but not to command Psychiatric:  Patient is unable to cooperate with exam.   Data Reviewed:  CTA head and neck CT head  Family Communication: Son at bedside.   Disposition: Status is: Inpatient Remains inpatient appropriate because: ongoing w/u  Planned Discharge Destination: snf      Author: Raymonde Calico, MD 10/17/2023 3:21 PM  For on call review [email or web address].

## 2023-10-17 NOTE — NC FL2 (Signed)
 [place]  MEDICAID [id number] LEVEL OF CARE FORM     IDENTIFICATION  Patient Name: Connor Dixon Birthdate: 12/13/1934 Sex: male Admission Date (Current Location): 10/10/2023  [facility] and [place] Number:  Producer, television/film/video and Address:  [facility], [address], [place], [place] [address]      Provider Number: [id number]  Attending Physician Name and Address:  Janeane Mealy, MD  Relative Name and Phone Number:       Current Level of Care: Hospital Recommended Level of Care: Skilled Nursing Facility Prior Approval Number:    Date Approved/Denied:   PASRR Number: [id number]  Discharge Plan: SNF    Current Diagnoses: Patient Active Problem List   Diagnosis Date Noted   Palliative care by specialist 10/16/2023   Acute encephalopathy 10/10/2023   Elevated troponin 10/10/2023   Prolonged QT interval 10/10/2023   CKD stage 4 due to type 2 diabetes mellitus (HCC) 09/06/2023   Seasonal allergic rhinitis due to pollen 09/06/2023   Post-traumatic osteoarthritis of left shoulder 01/05/2023   Paroxysmal atrial fibrillation (HCC) 09/03/2021   Diabetic nephropathy associated with type 2 diabetes mellitus (HCC) 06/09/2021   Vitamin D  deficiency disease 06/09/2021   Insulin -requiring or dependent type II diabetes mellitus (HCC) 06/09/2021   Encounter for general adult medical examination with abnormal findings 06/09/2020   Sinus node dysfunction (HCC) 02/13/2019   PPM-St.Jude 07/22/2010   Heart block AV complete (HCC) 07/13/2010   Diabetes mellitus with diabetic neuropathy (HCC) 08/06/2007   Hyperlipidemia with target LDL less than 70 08/06/2007   Morbid obesity (HCC) 08/06/2007   Essential hypertension 08/06/2007    Orientation RESPIRATION BLADDER Height & Weight     Self  Normal Incontinent Weight: 200 lb 9.9 oz (91 kg) Height:     BEHAVIORAL SYMPTOMS/MOOD NEUROLOGICAL BOWEL NUTRITION STATUS      Incontinent Diet (see DC summary)  AMBULATORY  STATUS COMMUNICATION OF NEEDS Skin   Extensive Assist Verbally Normal                       Personal Care Assistance Level of Assistance  Bathing, Feeding, Dressing Bathing Assistance: Maximum assistance Feeding assistance: Limited assistance Dressing Assistance: Maximum assistance     Functional Limitations Info  Speech     Speech Info: Impaired (dysarthria)    SPECIAL CARE FACTORS FREQUENCY  PT (By licensed PT), OT (By licensed OT), Speech therapy     PT Frequency: 5x/wk OT Frequency: 5x/wk     Speech Therapy Frequency: 5x/wk      Contractures Contractures Info: Not present    Additional Factors Info  Code Status, Allergies Code Status Info: DNR Allergies Info: Oysters (Shellfish Allergy), Citrus, Pecan Extract, Apple Juice, Farxiga  (Dapagliflozin )           Current Medications (10/17/2023):  This is the current hospital active medication list Current Facility-Administered Medications  Medication Dose Route Frequency Provider Last Rate Last Admin   acetaminophen  (TYLENOL ) tablet 650 mg  650 mg Oral Q4H PRN Doutova, Anastassia, MD   650 mg at 10/13/23 0555   Or   acetaminophen  (TYLENOL ) 160 MG/5ML solution 650 mg  650 mg Per Tube Q4H PRN Doutova, Anastassia, MD       Or   acetaminophen  (TYLENOL ) suppository 650 mg  650 mg Rectal Q4H PRN Doutova, Anastassia, MD       amLODipine (NORVASC) tablet 10 mg  10 mg Oral Daily Janeane Mealy, MD   10 mg at 10/17/23  6962   apixaban  (ELIQUIS ) tablet 2.5 mg  2.5 mg Oral BID Swayze, Ava, DO   2.5 mg at 10/17/23 0943   hydrALAZINE (APRESOLINE) tablet 10 mg  10 mg Oral Q8H PRN Swayze, Ava, DO   10 mg at 10/15/23 1517   Immune Globulin 10% (PRIVIGEN) IV infusion 35 g  400 mg/kg Intravenous Q24 Hr x 5 Augustin Leber, MD 218 mL/hr at 10/17/23 1349 Rate Change at 10/17/23 1349   insulin  aspart (novoLOG ) injection 0-20 Units  0-20 Units Subcutaneous TID WC Swayze, Ava, DO   4 Units at 10/17/23 1303   insulin   glargine-yfgn (SEMGLEE ) injection 20 Units  20 Units Subcutaneous QHS Swayze, Ava, DO   20 Units at 10/16/23 2200   [START ON 10/18/2023] lactose free nutrition (BOOST PLUS) liquid 237 mL  237 mL Oral BID BM Wouk, Haynes Lips, MD       LORazepam  (ATIVAN ) injection 2 mg  2 mg Intravenous Once PRN Wouk, Haynes Lips, MD       metoprolol tartrate (LOPRESSOR) tablet 12.5 mg  12.5 mg Oral BID Janeane Mealy, MD   12.5 mg at 10/17/23 9528   thiamine (VITAMIN B1) 500 mg in sodium chloride  0.9 % 50 mL IVPB  500 mg Intravenous TID Janeane Mealy, MD 110 mL/hr at 10/17/23 0944 500 mg at 10/17/23 0944     Discharge Medications: Please see discharge summary for a list of discharge medications.  Relevant Imaging Results:  Relevant Lab Results:   Additional Information SS#: [id number]  Tandy Fam, LCSW

## 2023-10-17 NOTE — Progress Notes (Signed)
 NEUROLOGY CONSULT FOLLOW UP NOTE   Date of service: October 17, 2023 Patient Name: Connor Dixon MRN:  [id number] DOB:  04-17-1935  Interval Hx/subjective   Patient slitting up in bed, son at bedside. Today is day 4/5 of IVIG treatment.  He is more alert than on yesterday's exam and consistently tracks examiner. Continues to say yes to questions, intermittently follows simple commands.  Pending repeat MRI brain with and without contrast.   Vitals   Vitals:   10/16/23 2038 10/17/23 0003 10/17/23 0420 10/17/23 0724  BP: (!) 165/93 (!) 145/85 (!) 154/85 (!) 145/83  Pulse: 70 63 69 74  Resp:  18 18 18   Temp:  97.8 F (36.6 C) 97.7 F (36.5 C) 98.7 F (37.1 C)  TempSrc:  Oral Axillary Oral  SpO2:  99% 95% 100%  Weight:         Body mass index is 30.5 kg/m.  Physical Exam   Constitutional: Appears elderly  Neurologic Examination    MS: Opens eyes, consistently tracks examiner. Says "yes" to all questions. Intermittently follows simple commands. CN: Improved tracking today with EOMI. Blinks to threat bilaterally, facial movement symmetric, no dysarthria, hearing intact to voice, shoulder shrug symmetric, tongue protrusion midline.  Motor/Sensory: He moves all extremities spontaneously.Withdraws in all extremities.   Medications  Current Facility-Administered Medications:    acetaminophen  (TYLENOL ) tablet 650 mg, 650 mg, Oral, Q4H PRN, 650 mg at 10/13/23 0555 **OR** acetaminophen  (TYLENOL ) 160 MG/5ML solution 650 mg, 650 mg, Per Tube, Q4H PRN **OR** acetaminophen  (TYLENOL ) suppository 650 mg, 650 mg, Rectal, Q4H PRN, Doutova, Anastassia, MD   amLODipine (NORVASC) tablet 10 mg, 10 mg, Oral, Daily, Wouk, Haynes Lips, MD, 10 mg at 10/16/23 1807   apixaban  (ELIQUIS ) tablet 2.5 mg, 2.5 mg, Oral, BID, Swayze, Ava, DO, 2.5 mg at 10/16/23 2200   feeding supplement (ENSURE PLUS HIGH PROTEIN) liquid 237 mL, 237 mL, Oral, BID BM, Augustin Leber, MD, 237 mL at 10/16/23 0941    hydrALAZINE (APRESOLINE) tablet 10 mg, 10 mg, Oral, Q8H PRN, Swayze, Ava, DO, 10 mg at 10/15/23 1517   Immune Globulin 10% (PRIVIGEN) IV infusion 35 g, 400 mg/kg, Intravenous, Q24 Hr x 5, Kirkpatrick, Althea Atkinson, MD, Stopped at 10/16/23 2009   insulin  aspart (novoLOG ) injection 0-20 Units, 0-20 Units, Subcutaneous, TID WC, Swayze, Ava, DO, 4 Units at 10/16/23 1719   insulin  glargine-yfgn (SEMGLEE ) injection 20 Units, 20 Units, Subcutaneous, QHS, Swayze, Ava, DO, 20 Units at 10/16/23 2200   LORazepam  (ATIVAN ) injection 2 mg, 2 mg, Intravenous, Once PRN, Wouk, Haynes Lips, MD   metoprolol tartrate (LOPRESSOR) tablet 12.5 mg, 12.5 mg, Oral, BID, Wouk, Haynes Lips, MD, 12.5 mg at 10/16/23 2200   thiamine (VITAMIN B1) 500 mg in sodium chloride  0.9 % 50 mL IVPB, 500 mg, Intravenous, TID, Wouk, Haynes Lips, MD, Stopped at 10/17/23 0555  Labs and Diagnostic Imaging    CT Head without contrast: No acute intracranial abnormality. ASPECTS 10.   CT Angio Head and Neck with contrast: No LVO. No evidence of significant stenosis, aneurysmal dilatation, or dissection involving the arteries of the head and neck  Repeat CT Head without contrast 5/28: No acute intracranial abnormality.Generalized atrophy and findings of chronic microvascular disease.  MRI Brain without contrast: No acute intracranial abnormality by noncontrast MRI. Signal changes compatible with chronic small vessel disease. Generalized appearing brain volume loss.  MRI Brain with and without contrast: PENDING  Labs:  Procalcitonin 0.11 pCO2 29 Ammonia less than 13 Creatinine 2.31 with  a BUN of 41--improved 6/2 to 44/1.42. TSH 1.002 CK is normal Overnight EEG is normal CSF protein 57 CSF glucose 191 CSF WBC two CSF RBC 47 Negative meningoencephalitis panel including HSV B12, B1 WNL ANA Negative Sjogren's Negative RPR Negative  CSF autoimmune panel to [facility] is pending Serum autoimmune encephalitis panel to [facility] is  pending OC bands, IgG index, VDRL is pending  Spoke to [facility] regarding these pending labs 6/2, confirmed that these are being run.    Assessment   Connor Dixon is a 89 y.o. male who presented with severe encephalopathy which is currently of unclear etiology.  Continues to show improvement daily, unclear if this is due to the antibiotics or simply natural history.  No evidence of seizure on prolonged EEG, no evidence of stroke on MRI.    He continues to improve, possibilities including seizures secondary to septic encephalopathy of unclear source with response to antibiotics, delirium of unclear etiology, autoimmune encephalopathy with some response seen from IVIG treatment. Continuous EEG was negative, and his exam did not seem very consistent with seizure on arrival.  Extensive workup for CNS processes has not shown a clear etiology. Patient continues to show some improvement each day. IVIG is now on day 4. Autoimmune labs are pending. MRI with and without contrast is pending for further evaluations.    Recommendations   Follow-up autoimmune encephalopathy testing - Continue IVIG day 4/5 - Continue Daily BMP while on IVIG - Repeat brain MRI with and without contrast  - OOB as able - Delirium Precautions - Day/night/sleep interventions   We will continue to follow pending labs.  ______________________________________________________________________   Pt seen by Neuro NP/APP and later by MD. Note/plan to be edited by MD as needed.    Audrene Lease, DNP [facility] Please use AMION for contact information & EPIC for messaging.   Attending Neurohospitalist Addendum Patient seen and examined with APP/Resident. Agree with the history and physical as documented above. Agree with the plan as documented, which I helped formulate. I have edited the note above to reflect my full findings and recommendations. I have independently reviewed the chart, obtained history,  review of systems and examined the patient.I have personally reviewed pertinent head/neck/spine imaging (CT/MRI). Please feel free to call with any questions.  -- Greg Leaks, MD [facility] [phone]  If 7pm- 7am, please page neurology on call as listed in AMION.

## 2023-10-18 DIAGNOSIS — G9340 Encephalopathy, unspecified: Secondary | ICD-10-CM | POA: Diagnosis not present

## 2023-10-18 LAB — GLUCOSE, CAPILLARY
Glucose-Capillary: 199 mg/dL — ABNORMAL HIGH (ref 70–99)
Glucose-Capillary: 183 mg/dL — ABNORMAL HIGH (ref 70–99)
Glucose-Capillary: 301 mg/dL — ABNORMAL HIGH (ref 70–99)
Glucose-Capillary: 74 mg/dL (ref 70–99)

## 2023-10-18 LAB — BASIC METABOLIC PANEL WITH GFR
Anion gap: 5 (ref 5–15)
BUN: 40 mg/dL — ABNORMAL HIGH (ref 8–23)
CO2: 23 mmol/L (ref 22–32)
Calcium: 8 mg/dL — ABNORMAL LOW (ref 8.9–10.3)
Chloride: 101 mmol/L (ref 98–111)
Creatinine, Ser: 1.76 mg/dL — ABNORMAL HIGH (ref 0.61–1.24)
GFR, Estimated: 37 mL/min — ABNORMAL LOW (ref >60)
Glucose, Bld: 186 mg/dL — ABNORMAL HIGH (ref 70–99)
Potassium: 3.9 mmol/L (ref 3.5–5.1)
Sodium: 129 mmol/L — ABNORMAL LOW (ref 135–145)

## 2023-10-18 LAB — FOLATE: Folate: 4.5 ng/mL — ABNORMAL LOW (ref >5.9)

## 2023-10-18 MED ORDER — CYANOCOBALAMIN 1000 MCG/ML IJ SOLN
1000.0000 ug | Freq: Every day | INTRAMUSCULAR | Status: DC
  Administered 2023-10-18 – 2023-10-28 (×11): 1000 ug via SUBCUTANEOUS
  Filled 2023-10-18 (×11): qty 1

## 2023-10-18 MED ORDER — SODIUM CHLORIDE 0.9 % IV SOLN: 1.0000 mg | Freq: Every day | INTRAVENOUS | Status: DC

## 2023-10-18 MED ORDER — FOLIC ACID 5 MG/ML IJ SOLN
1.0000 mg | Freq: Every day | INTRAMUSCULAR | Status: DC
  Administered 2023-10-18 – 2023-10-21 (×4): 1 mg via INTRAVENOUS
  Filled 2023-10-18 (×5): qty 0.2

## 2023-10-18 NOTE — TOC Progression Note (Signed)
 Transition of Care [facility]) - Progression Note    Patient Details  Name: Connor Dixon MRN: [id number] Date of Birth: 07/01/1934  [facility] of [facility]) CM/SW Contact  Tandy Fam, [place] Phone Number: 10/18/2023, 1:19 PM  Clinical Narrative:   CSW met with patient and family at bedside to check in on SNF options. Family has not reviewed SNF options at this time, but has CSW contact information for questions when they are able to review. CSW to follow.    Expected Discharge Plan: Skilled Nursing Facility Barriers to Discharge: Insurance Authorization  Expected Discharge Plan and Services                                               Social Determinants of Health (SDOH) Interventions SDOH Screenings   Food Insecurity: Patient Unable To Answer (10/13/2023)  Housing: Patient Unable To Answer (10/13/2023)  Transportation Needs: Patient Unable To Answer (10/13/2023)  Utilities: Patient Unable To Answer (10/13/2023)  Alcohol Screen: Low Risk  (04/06/2023)  Depression (PHQ2-9): Low Risk  (09/06/2023)  Financial Resource Strain: Low Risk  (04/06/2023)  Physical Activity: Inactive (04/06/2023)  Social Connections: Unknown (10/13/2023)  Stress: No Stress Concern Present (04/06/2023)  Tobacco Use: Low Risk  (10/10/2023)  Health Literacy: Adequate Health Literacy (04/06/2023)    Readmission Risk Interventions     No data to display

## 2023-10-18 NOTE — Hospital Course (Addendum)
 Connor Dixon is an 89 yo male with PMH chronic diastolic CHF, complete AV block s/p pacemaker, CKD 3, diabetes, HTN, HLD who presented after being found slumped over in his chair at home and unresponsive. He was spoken to on the phone on the evening of 10/09/2023 and upon being found on 10/10/2023 by the cleaner he was noted to be unresponsive slumped over the chair. At baseline he is considered high functioning and ambulates independently and lives alone.  He was admitted for further workup of altered mentation.   In the ED the patient underwent a CT head that demonstrated no acute process. A CTA head and neck demonstrated no LVO. There was however evidence of a remote lacunar infarct of the anterior right corona radiata. There was concern for Seizure, Posterior Circulation CVA, and meningitis. The patient will need to be off of anticoagulation for at least 48 hours. LP is planned for 10/12/2023. The patient will be started on empiric antibiotic coverage for meningitis.    MRI was performed and demonstrated no acute intracranial abnormality. There was evidence for chronic small vessel disease. There was also generalized appearing brain volume loss. EEG unremarkable. Neurology has evaluated the patient. Plan is for LP on the afternoon of 10/12/2023.    On 10/13/2023 the patient again appears completely obtunded. This may be due to ativan  given for MRI on 10/12/2023, but in any case the patient is not as alert as he was on 10/12/2023.    MRI brain has ruled out CVA.   LP performed on 10/13/2023 has ruled out meningitis. Have added on VDRL of CSF onto studies for the CSF. Antibiotics for meningitis stopped as per neurology. However, steroids continued in case there is an autoimmune component to the patient's illness.  Steroid was discontinued on 10/14/2023.   DDx continues to include autoimmune diseases and tertiary syphillis. HIV testing has also been ordered.   10/14/2023 the patient is much improved  according to the family at bedside. The patient has cracked a joke with the phlebotomist, eaten some breakfast, and been able to open his phone with his password. He has spoken. At the time of my visit, the patient is certainly more alert. He is non-verbal with me, but does track and attend to me.   Will continue IVIG and steroids. Consult PT/OT.   10/15/2023 Patient still appears improved, but not as interactive as 10/14/2023. Family states that he is just sleepy after eating breakfast.  As of 10/18/2023 also not speaking much or making sense.  Intermittently will follow some commands.    Assessment and Plan:    Acute encephalopathy - MRI neg for stroke. EEG no seizure like activity. LP no meningitis. Neurology thinks possible autoimmune encephalitis and completed 5 days IVIG on 10/18/23 but hasn't made much improvement - MRI w/wo contrast on 6/3 negative for acute findings also (remote infarcts) - B12 low/normal on 6/1 but no replacement; will replete now - folate low at 4.5, so will also start repletion  - EEG testing being repeated 6/5; still unremarkable - Unfortunately not much improvement.  Mostly babbles throughout the day and says yes/no inappropriately; cannot get him to follow commands. If no major improvement after IVIG the other theory might have to be some level of dementia that was already present and worsened with the acute illness that led to hospitalization  Suspected cognitive impairment, POA - patient is described as having some level of decreasing cognition over at least weeks to months prior to admission; more forgetfulness  with directions, cooking/prepping meals, finances, and not driving lawn mower well - quite possible as noted above that underlying cognitive impairment became acutely worsened in the setting of this acute illness - might need more time to acclimate and return to close to baseline but only time will tell at this rate  Essential hypertension Bp mild/mod  elevation - started amlodipine  (home losart and coreg  are on hold)   Elevated serum osm gap Etiology unclear. Not acidotic. Family thinks toxic alcohol ingestion unlikely. Discussed with dr. Arno Bibles of hematology, would be abnormal for paraproteinemia to present with purely encephalopathy, he advises following labs ordered. - IgG normal/elevated; no free kappa chains; no M-spike   Elevated troponin Appears to be demand - Echo unrmarkable   Acute urinary retention - resolved -Foley discontinued 6/2   Hyperlipidemia with target LDL less than 70 Not able to tolerate PO statin at this time   Insulin -requiring or dependent type II diabetes mellitus (HCC) Glucose improved Continue basal/bolus/ssi   Paroxysmal atrial fibrillation (HCC) - Continue Eliquis  and Lopressor    Sinus node dysfunction (HCC) - s/p pacemaker   Prolonged QT interval - will monitor on tele avoid QT prolonging medications, rehydrate correct electrolytes

## 2023-10-18 NOTE — Progress Notes (Signed)
 Progress Note    Connor Dixon   UJW:[id number]  DOB: September 11, 1934  DOA: 10/10/2023     8 PCP: Arcadio Knuckles, MD  Initial CC: Found down at home  Hospital Course: Connor Dixon is an 89 yo male with PMH chronic diastolic CHF, complete AV block s/p pacemaker, CKD 3, diabetes, HTN, HLD who presented after being found slumped over in his chair at home and unresponsive. He was spoken to on the phone on the evening of 10/09/2023 and upon being found on 10/10/2023 by the cleaner he was noted to be unresponsive slumped over the chair. At baseline he is considered high functioning and ambulates independently and lives alone.  He was admitted for further workup of altered mentation.   In the ED the patient underwent a CT head that demonstrated no acute process. A CTA head and neck demonstrated no LVO. There was however evidence of a remote lacunar infarct of the anterior right corona radiata. There was concern for Seizure, Posterior Circulation CVA, and meningitis. The patient will need to be off of anticoagulation for at least 48 hours. LP is planned for 10/12/2023. The patient will be started on empiric antibiotic coverage for meningitis.    MRI was performed and demonstrated no acute intracranial abnormality. There was evidence for chronic small vessel disease. There was also generalized appearing brain volume loss. EEG unremarkable. Neurology has evaluated the patient. Plan is for LP on the afternoon of 10/12/2023.    On 10/13/2023 the patient again appears completely obtunded. This may be due to ativan  given for MRI on 10/12/2023, but in any case the patient is not as alert as he was on 10/12/2023.    MRI brain has ruled out CVA.   LP performed on 10/13/2023 has ruled out meningitis. Have added on VDRL of CSF onto studies for the CSF. Antibiotics for meningitis stopped as per neurology. However, steroids continued in case there is an autoimmune component to the patient's illness.  Steroid was  discontinued on 10/14/2023.   DDx continues to include autoimmune diseases and tertiary syphillis. HIV testing has also been ordered.   10/14/2023 the patient is much improved according to the family at bedside. The patient has cracked a joke with the phlebotomist, eaten some breakfast, and been able to open his phone with his password. He has spoken. At the time of my visit, the patient is certainly more alert. He is non-verbal with me, but does track and attend to me.   Will continue IVIG and steroids. Consult PT/OT.   10/15/2023 Patient still appears improved, but not as interactive as 10/14/2023. Family states that he is just sleepy after eating breakfast.  As of 10/18/2023 also not speaking much or making sense.  Intermittently will follow some commands.    Assessment and Plan:    Acute encephalopathy - MRI no stroke. EEG no seizure like activity. LP no meningitis. Neurology thinks possible autoimmune encephalitis and completes 5 days IVIG on 10/18/23 but hasn't made much difference per family  - MRI w/wo contrast on 6/3 negative for acute findings also (remote infarcts) - B12 low/normal on 6/1 but no replacement; will replete now - will check folate level at this point too; folate low at 4.5, so will also start repletion   Essential hypertension Bp mild/mod elevation - started amlodipine (home losart and coreg  are on hold)   Elevated serum osm gap Etiology unclear. Not acidotic. Family thinks toxic alcohol ingestion unlikely. Discussed with dr. Arno Bibles of hematology, would be  abnormal for paraproteinemia to present with purely encephalopathy, he advises following labs ordered. - kappa/lambda light chain ratio, SPEP, and immunoglobulin levels.    Elevated troponin Appears to be demand - Echo unrmarkable   Foley catheter Discontinued 6/2   Hyperlipidemia with target LDL less than 70 Not able to tolerate PO statin at this time   Insulin -requiring or dependent type II diabetes mellitus  (HCC) Glucose improved Continue basal/bolus/ssi   Paroxysmal atrial fibrillation (HCC) Continue apixaban  Continue metop     Sinus node dysfunction (HCC) - s/p pacemaker   Prolonged QT interval - will monitor on tele avoid QT prolonging medications, rehydrate correct electrolytes  Interval History:  Son and daughter-in-law present bedside.  Seems that he has regressed a little bit over the past few days and now mostly saying yes or no inappropriately to questions.  Was able to follow a couple commands for me today. He is not in any distress but remains overtly confused appearing.   Old records reviewed in assessment of this patient  Antimicrobials:  DVT prophylaxis:  apixaban  (ELIQUIS ) tablet 2.5 mg Start: 10/15/23 1430 SCDs Start: 10/15/23 1335 SCD's Start: 10/10/23 2338 apixaban  (ELIQUIS ) tablet 2.5 mg   Code Status:   Code Status: Limited: Do not attempt resuscitation (DNR) -DNR-LIMITED -Do Not Intubate/DNI   Mobility Assessment (Last 72 Hours)     Mobility Assessment     Row Name 10/18/23 1341 10/18/23 1331 10/18/23 0755 10/18/23 0545 10/18/23 0400   Does patient have an order for bedrest or is patient medically unstable -- -- No - Continue assessment No - Continue assessment No - Continue assessment   What is the highest level of mobility based on the progressive mobility assessment? Level 1 (Bedfast) - Unable to balance while sitting on edge of bed Level 1 (Bedfast) - Unable to balance while sitting on edge of bed Level 1 (Bedfast) - Unable to balance while sitting on edge of bed Level 1 (Bedfast) - Unable to balance while sitting on edge of bed Level 1 (Bedfast) - Unable to balance while sitting on edge of bed   Is the above level different from baseline mobility prior to current illness? -- -- Yes - Recommend PT order Yes - Recommend PT order Yes - Recommend PT order    Row Name 10/18/23 0010 10/17/23 2125 10/17/23 2000 10/17/23 1944 10/17/23 0800   Does patient have  an order for bedrest or is patient medically unstable No - Continue assessment No - Continue assessment No - Continue assessment No - Continue assessment Yes- Bedfast (Level 1) - Complete   What is the highest level of mobility based on the progressive mobility assessment? Level 1 (Bedfast) - Unable to balance while sitting on edge of bed Level 1 (Bedfast) - Unable to balance while sitting on edge of bed Level 1 (Bedfast) - Unable to balance while sitting on edge of bed Level 1 (Bedfast) - Unable to balance while sitting on edge of bed Level 1 (Bedfast) - Unable to balance while sitting on edge of bed   Is the above level different from baseline mobility prior to current illness? Yes - Recommend PT order Yes - Recommend PT order Yes - Recommend PT order Yes - Recommend PT order Yes - Recommend PT order    Row Name 10/17/23 0600 10/17/23 0410 10/17/23 0200 10/16/23 2354 10/16/23 2200   Does patient have an order for bedrest or is patient medically unstable No - Continue assessment No - Continue assessment No - Continue  assessment No - Continue assessment No - Continue assessment   What is the highest level of mobility based on the progressive mobility assessment? Level 1 (Bedfast) - Unable to balance while sitting on edge of bed Level 1 (Bedfast) - Unable to balance while sitting on edge of bed Level 1 (Bedfast) - Unable to balance while sitting on edge of bed Level 3 (Stands with assist) - Balance while standing  and cannot march in place Level 3 (Stands with assist) - Balance while standing  and cannot march in place   Is the above level different from baseline mobility prior to current illness? Yes - Recommend PT order Yes - Recommend PT order Yes - Recommend PT order Yes - Recommend PT order Yes - Recommend PT order    Row Name 10/16/23 2000 10/16/23 1329 10/16/23 0800 10/15/23 2000 10/15/23 1752   Does patient have an order for bedrest or is patient medically unstable No - Continue assessment -- Yes-  Bedfast (Level 1) - Complete No - Continue assessment No - Continue assessment   What is the highest level of mobility based on the progressive mobility assessment? Level 3 (Stands with assist) - Balance while standing  and cannot march in place Level 3 (Stands with assist) - Balance while standing  and cannot march in place Level 1 (Bedfast) - Unable to balance while sitting on edge of bed Level 1 (Bedfast) - Unable to balance while sitting on edge of bed Level 1 (Bedfast) - Unable to balance while sitting on edge of bed   Is the above level different from baseline mobility prior to current illness? Yes - Recommend PT order -- Yes - Recommend PT order Yes - Recommend PT order Yes - Recommend PT order            Barriers to discharge:  Disposition Plan:  TBD HH orders placed: TBD Status is: Inpt  Objective: Blood pressure 132/66, pulse 69, temperature 97.8 F (36.6 C), temperature source Oral, resp. rate 17, weight 91 kg, SpO2 98%.  Examination:  Physical Exam Constitutional:      Comments: Awake and resting in bed in no distress but not fully able to interact  HENT:     Head: Normocephalic and atraumatic.     Mouth/Throat:     Mouth: Mucous membranes are moist.  Eyes:     Extraocular Movements: Extraocular movements intact.  Cardiovascular:     Rate and Rhythm: Normal rate and regular rhythm.  Pulmonary:     Effort: Pulmonary effort is normal. No respiratory distress.     Breath sounds: Normal breath sounds. No wheezing.  Abdominal:     General: Bowel sounds are normal. There is no distension.     Palpations: Abdomen is soft.     Tenderness: There is no abdominal tenderness.  Musculoskeletal:        General: Normal range of motion.     Cervical back: Normal range of motion and neck supple.  Skin:    General: Skin is warm and dry.  Neurological:     Comments: Mostly answers yes to questions.  Could follow a couple commands intermittently.  Withdraws to pain in all 4  extremities appropriately      Consultants:  Neurology  Procedures:    Data Reviewed: Results for orders placed or performed during the hospital encounter of 10/10/23 (from the past 24 hours)  Glucose, capillary     Status: Abnormal   Collection Time: 10/17/23  4:46 PM  Result  Value Ref Range   Glucose-Capillary 141 (H) 70 - 99 mg/dL  Ethanol     Status: None   Collection Time: 10/17/23  7:44 PM  Result Value Ref Range   Alcohol, Ethyl (B) <15 <15 mg/dL  Glucose, capillary     Status: Abnormal   Collection Time: 10/17/23  9:19 PM  Result Value Ref Range   Glucose-Capillary 194 (H) 70 - 99 mg/dL   Comment 1 Notify RN   Basic metabolic panel     Status: Abnormal   Collection Time: 10/18/23  5:20 AM  Result Value Ref Range   Sodium 129 (L) 135 - 145 mmol/L   Potassium 3.9 3.5 - 5.1 mmol/L   Chloride 101 98 - 111 mmol/L   CO2 23 22 - 32 mmol/L   Glucose, Bld 186 (H) 70 - 99 mg/dL   BUN 40 (H) 8 - 23 mg/dL   Creatinine, Ser 1.61 (H) 0.61 - 1.24 mg/dL   Calcium 8.0 (L) 8.9 - 10.3 mg/dL   GFR, Estimated 37 (L) >60 mL/min   Anion gap 5 5 - 15  Folate     Status: Abnormal   Collection Time: 10/18/23  5:20 AM  Result Value Ref Range   Folate 4.5 (L) >5.9 ng/mL  Glucose, capillary     Status: Abnormal   Collection Time: 10/18/23  6:22 AM  Result Value Ref Range   Glucose-Capillary 199 (H) 70 - 99 mg/dL  Glucose, capillary     Status: Abnormal   Collection Time: 10/18/23 11:01 AM  Result Value Ref Range   Glucose-Capillary 183 (H) 70 - 99 mg/dL    I have reviewed pertinent nursing notes, vitals, labs, and images as necessary. I have ordered labwork to follow up on as indicated.  I have reviewed the last notes from staff over past 24 hours. I have discussed patient's care plan and test results with nursing staff, CM/SW, and other staff as appropriate.  Time spent: Greater than 50% of the 55 minute visit was spent in counseling/coordination of care for the patient as laid  out in the A&P.   LOS: 8 days   Faith Homes, MD [facility] 10/18/2023, 3:59 PM

## 2023-10-18 NOTE — Progress Notes (Signed)
 Physical Therapy Treatment Patient Details Name: Connor Dixon MRN: [id number] DOB: [birth year] Today's Date: 10/18/2023   History of Present Illness Pt is 89 year old presented to [facility] on  10/10/23 after found at home unresponsive. Pt with dense encephalopathy with flexor positioning of UE's. LP performed on 10/13/2023 has ruled out meningitis. CT and MRI on 5/29 were negative for acute intercranial abnormality.  PMH - afib on Eliquis , pacer, HTN, CKD, DM, obesity   PT Comments  Pt improved in today's session by being able to sit on the EOB for an extended period of time. Majority of session, pt required Max/TotalA posteriorly for R lateral lean. After L elbow prop and sit<>stand attempts, pt had improved balance requiring MinA. Pt continues to have a forward flexed posture with difficulty clearing hips with stand attempts with the Stedy. With +3 to assist anteriorly, pt had improved upright posture for ~10 seconds. MaxAx2 to return to supine. Continue to recommend <3hrs post acute rehab. Acute PT to follow.    If plan is discharge home, recommend the following: Two people to help with walking and/or transfers;Assistance with cooking/housework;Supervision due to cognitive status;Assist for transportation;Help with stairs or ramp for entrance   Can travel by private vehicle     No  Equipment Recommendations  BSC/3in1;Wheelchair cushion (measurements PT);Wheelchair (measurements PT)       Precautions / Restrictions Precautions Precautions: Fall Recall of Precautions/Restrictions: Impaired Restrictions Weight Bearing Restrictions Per Provider Order: No     Mobility  Bed Mobility Overal bed mobility: Needs Assistance Bed Mobility: Supine to Sit, Sit to Supine, Rolling Rolling: Max assist   Supine to sit: Max assist, +2 for physical assistance Sit to supine: Max assist, +2 for physical assistance   General bed mobility comments: MaxAx2 to move LE's off EOB with pt slightly assisting with  raising trunk. Use of bed pad to shift hips forward    Transfers Overall transfer level: Needs assistance Equipment used: Ambulation equipment used Transfers: Sit to/from Stand Sit to Stand: Max assist, +2 physical assistance, Via lift equipment, From elevated surface (+3 to assist anteriorly)    General transfer comment: assist needed to place R hand on stedy handle, MaxAx2 with gait belt and use of bed pad. Pt with forward flexed trunk and difficulty tucking hips. 2nd stand with +3 to assist with boost-up with increased clearance Transfer via Lift Equipment: Stedy  Ambulation/Gait  General Gait Details: unable this date      Balance Overall balance assessment: Needs assistance Sitting-balance support: Feet supported Sitting balance-Leahy Scale: Poor Sitting balance - Comments: TotalA to MaxA for majority of session with R lateral lean. After standing and with Stedy placed, progressed to MinA. Improved R lateral lean after propping onto L elbow. Postural control: Right lateral lean Standing balance support: Bilateral upper extremity supported, No upper extremity supported, Reliant on assistive device for balance Standing balance-Leahy Scale: Zero Standing balance comment: Max A +2, flexed posture, Film/video editor Communication: Impaired Factors Affecting Communication: Difficulty expressing self  Cognition Arousal: Alert Behavior During Therapy: Flat affect, Impulsive   PT - Cognitive impairments: Initiation, Attention, Problem solving    Following commands: Impaired Following commands impaired: Follows one step commands inconsistently, Follows one step commands with increased time    Cueing Cueing Techniques: Verbal cues, Gestural cues, Tactile cues, Visual cues  Exercises Other Exercises Other Exercises: Prop onto L elbow, x2        Pertinent Vitals/Pain Pain Assessment Pain Assessment:  Faces Faces Pain Scale: Hurts little more Pain  Location: LT shoulder per daughetr. Pain Descriptors / Indicators: Guarding, Grimacing Pain Intervention(s): Limited activity within patient's tolerance, Monitored during session, Repositioned     PT Goals (current goals can now be found in the care plan section) Acute Rehab PT Goals PT Goal Formulation: With family Time For Goal Achievement: 10/27/23 Potential to Achieve Goals: Good Progress towards PT goals: Progressing toward goals    Frequency    Min 2X/week           Co-evaluation   Reason for Co-Treatment: Complexity of the patient's impairments (multi-system involvement);Necessary to address cognition/behavior during functional activity PT goals addressed during session: Mobility/safety with mobility;Balance OT goals addressed during session: ADL's and self-care;Strengthening/ROM      AM-PAC PT "6 Clicks" Mobility   Outcome Measure  Help needed turning from your back to your side while in a flat bed without using bedrails?: A Lot Help needed moving from lying on your back to sitting on the side of a flat bed without using bedrails?: Total Help needed moving to and from a bed to a chair (including a wheelchair)?: Total Help needed standing up from a chair using your arms (e.g., wheelchair or bedside chair)?: Total Help needed to walk in hospital room?: Total Help needed climbing 3-5 steps with a railing? : Total 6 Click Score: 7    End of Session Equipment Utilized During Treatment: Gait belt Activity Tolerance: Patient tolerated treatment well Patient left: in bed;with call bell/phone within reach;with bed alarm set;with family/visitor present Nurse Communication: Mobility status PT Visit Diagnosis: Other abnormalities of gait and mobility (R26.89);Other symptoms and signs involving the nervous system (R29.898);Muscle weakness (generalized) (M62.81)     Time: 6045-4098 PT Time Calculation (min) (ACUTE ONLY): 34 min  Charges:    $Therapeutic Activity: 8-22  mins PT General Charges $$ ACUTE PT VISIT: 1 Visit                     Orysia Blas, PT, DPT Secure Chat Preferred  Rehab Office [phone]   Alissa April Adela Ades 10/18/2023, 1:50 PM

## 2023-10-18 NOTE — Progress Notes (Signed)
 NEUROLOGY CONSULT FOLLOW UP NOTE   Date of service: October 18, 2023 Patient Name: Connor Dixon MRN:  [id number] DOB:  1934-07-08  Interval Hx/subjective   Family at the bedside. Patient is sitting up in the bed in NAD eating his breakfast. He is alert and awake.  Today is his last day of IVIG 5/5. MRI brain with no acute process  Neurological exam remains the same   Vitals   Vitals:   10/17/23 2001 10/17/23 2349 10/18/23 0357 10/18/23 0725  BP: 126/61 133/76 (!) 134/90 (!) 126/53  Pulse: 82 67 67 73  Resp: 18 16 18 20   Temp: 98.2 F (36.8 C) 98.3 F (36.8 C) 97.6 F (36.4 C) 98.1 F (36.7 C)  TempSrc:  Oral Oral Oral  SpO2: 98% 98% 100% 98%  Weight:         Body mass index is 30.5 kg/m.  Physical Exam   Constitutional: elderly male in NAD  Psych: Affect appropriate to situation.   Eyes: No scleral injection.   HENT: No OP obstrucion.   Head: Normocephalic.   Cardiovascular: Normal rate and regular rhythm.   Respiratory: Effort normal, non-labored breathing.   GI: Soft.  No distension. There is no tenderness.   Skin: WDI.    Neurologic Examination    MS: Opens eyes, consistently tracks examiner. Says "yes" to all questions. Does not follow simple commands. CN: tracking Blinks to threat bilaterally, facial movement symmetric, no dysarthria, hearing intact to voice, shoulder shrug symmetric, tongue protrusion midline.  Motor/Sensory: He moves all extremities spontaneously.Withdraws in all extremities.   Medications  Current Facility-Administered Medications:    acetaminophen  (TYLENOL ) tablet 650 mg, 650 mg, Oral, Q4H PRN, 650 mg at 10/13/23 0555 **OR** acetaminophen  (TYLENOL ) 160 MG/5ML solution 650 mg, 650 mg, Per Tube, Q4H PRN **OR** acetaminophen  (TYLENOL ) suppository 650 mg, 650 mg, Rectal, Q4H PRN, Doutova, Anastassia, MD   amLODipine (NORVASC) tablet 10 mg, 10 mg, Oral, Daily, Wouk, Haynes Lips, MD, 10 mg at 10/17/23 0943   apixaban  (ELIQUIS ) tablet 2.5 mg, 2.5  mg, Oral, BID, Swayze, Ava, DO, 2.5 mg at 10/17/23 2121   hydrALAZINE (APRESOLINE) tablet 10 mg, 10 mg, Oral, Q8H PRN, Swayze, Ava, DO, 10 mg at 10/15/23 1517   Immune Globulin 10% (PRIVIGEN) IV infusion 35 g, 400 mg/kg, Intravenous, Q24 Hr x 5, Kirkpatrick, Althea Atkinson, MD, Stopped at 10/17/23 1517   insulin  aspart (novoLOG ) injection 0-20 Units, 0-20 Units, Subcutaneous, TID WC, Swayze, Ava, DO, 4 Units at 10/18/23 6213   insulin  glargine-yfgn (SEMGLEE ) injection 20 Units, 20 Units, Subcutaneous, QHS, Swayze, Ava, DO, 20 Units at 10/17/23 2121   lactose free nutrition (BOOST PLUS) liquid 237 mL, 237 mL, Oral, BID BM, Wouk, Haynes Lips, MD   LORazepam  (ATIVAN ) injection 2 mg, 2 mg, Intravenous, Once PRN, Wouk, Haynes Lips, MD   metoprolol tartrate (LOPRESSOR) tablet 12.5 mg, 12.5 mg, Oral, BID, Wouk, Haynes Lips, MD, 12.5 mg at 10/17/23 2121   thiamine (VITAMIN B1) 500 mg in sodium chloride  0.9 % 50 mL IVPB, 500 mg, Intravenous, TID, Wouk, Haynes Lips, MD, Stopped at 10/17/23 2155  Labs and Diagnostic Imaging   CBC:  Recent Labs  Lab 10/15/23 1016 10/16/23 0541  WBC 10.3 5.3  NEUTROABS 9.1* 3.8  HGB 16.0 16.1  HCT 47.6 48.4  MCV 90.2 92.0  PLT 201 151    Basic Metabolic Panel:  Lab Results  Component Value Date   NA 129 (L) 10/18/2023   K 3.9 10/18/2023   CO2  23 10/18/2023   GLUCOSE 186 (H) 10/18/2023   BUN 40 (H) 10/18/2023   CREATININE 1.76 (H) 10/18/2023   CALCIUM 8.0 (L) 10/18/2023   GFRNONAA 37 (L) 10/18/2023   GFRAA 32 (L) 12/10/2019   Lipid Panel:  Lab Results  Component Value Date   LDLCALC 68 10/11/2023   HgbA1c:  Lab Results  Component Value Date   HGBA1C 8.7 (H) 09/06/2023   Urine Drug Screen:     Component Value Date/Time   LABOPIA NONE DETECTED 10/10/2023 1747   COCAINSCRNUR NONE DETECTED 10/10/2023 1747   LABBENZ NONE DETECTED 10/10/2023 1747   AMPHETMU NONE DETECTED 10/10/2023 1747   THCU NONE DETECTED 10/10/2023 1747   LABBARB NONE DETECTED  10/10/2023 1747    Alcohol Level     Component Value Date/Time   ETH <15 10/17/2023 1944   INR  Lab Results  Component Value Date   INR 1.2 10/10/2023   APTT  Lab Results  Component Value Date   APTT 37 (H) 10/10/2023   AED levels: No results found for: "PHENYTOIN", "ZONISAMIDE", "LAMOTRIGINE", "LEVETIRACETA"  CT Head without contrast(Personally reviewed): No acute intracranial abnormality. ASPECTS 10   CT angio Head and Neck with contrast(Personally reviewed): No LVO. No evidence of significant stenosis, aneurysmal dilatation, or dissection involving the arteries of the head and neck  Repeat CT Head without contrast 5/28:  No acute intracranial abnormality.Generalized atrophy and findings of chronic microvascular disease.   MRI Brain(Personally reviewed): No acute intracranial abnormality by noncontrast MRI. Signal changes compatible with chronic small vessel disease. Generalized appearing brain volume loss.   MRI brain w/wo:  No acute intracranial abnormality. Moderate atrophy and white matter disease bilaterally. 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  Labs  Serum paraneoplastic negative  CSF [facility] autoimmune panel pending   Assessment   Connor Dixon is a 89 y.o. male who presented with severe encephalopathy which is currently of unclear etiology.  Continues to show improvement daily, unclear if this is due to the antibiotics or simply natural history.  No evidence of seizure on prolonged EEG, no evidence of stroke on MRI.     He continues to improve, possibilities including seizures secondary to septic encephalopathy of unclear source with response to antibiotics, delirium of unclear etiology, autoimmune encephalopathy with some response seen from IVIG treatment. Continuous EEG was negative, and his exam did not seem very consistent with seizure on arrival.   Extensive workup for  CNS processes has not shown a clear etiology. Patient continues to show some improvement each day. IVIG is now on day 5, completed course. In some cases IVIG  may be of some benefit of continuing  after  a completed course of 5 days. Serum paraneoplastic panel is negative. We are still awaiting Autoimmune labs sent to [facility] clinic are pending  We will discuss with family tomorrow   Recommendations  -Follow-up autoimmune labs are still pending  - OOB as able - Delirium Precautions - Day/night/sleep interventions - PT/OT  ______________________________________________________________________   Signed, Laymond Priestly, NP [facility]

## 2023-10-18 NOTE — Progress Notes (Signed)
 Occupational Therapy Treatment Patient Details Name: Connor Dixon MRN: [id number] DOB: 1934-11-06 Today's Date: 10/18/2023   History of present illness Pt is 89 year old presented to [facility] on  10/10/23 after found at home unresponsive. Pt with dense encephalopathy with flexor positioning of UE's. LP performed on 10/13/2023 has ruled out meningitis. CT and MRI on 5/29 were negative for acute intercranial abnormality.  PMH - afib on Eliquis , pacer, HTN, CKD, DM, obesity   OT comments  Patient slowly progressing and showed improved ability to sit EOB for extended period of time with moments progressing sitting balance from Max As with strong RT lateral lean/push to Min As. Pt stood to stedy better than compared to previous session, but still unable to stand fully upright or enough to use stedy flaps.   Patient remains limited by rigidity/stiffness, severe cognitive deficit, LT shoulder pain, profound generalized weakness and decreased activity tolerance along with deficits noted below.  Pt still overall Total Assist of 1-2 people for all ADLs, but is participating with therapy.  Pt continues to demonstrate fair rehab potential and would benefit from continued skilled OT to increase safety and independence with ADLs and functional transfers to allow pt to return home safely and reduce caregiver burden and fall risk.       If plan is discharge home, recommend the following:  Two people to help with walking and/or transfers;Two people to help with bathing/dressing/bathroom;Direct supervision/assist for medications management;Direct supervision/assist for financial management;Supervision due to cognitive status;Assist for transportation;Assistance with cooking/housework;Help with stairs or ramp for entrance   Equipment Recommendations       Recommendations for Other Services      Precautions / Restrictions Precautions Precautions: Fall Recall of Precautions/Restrictions: Impaired Restrictions Weight  Bearing Restrictions Per Provider Order: No       Mobility Bed Mobility Overal bed mobility: Needs Assistance Bed Mobility: Supine to Sit, Sit to Supine, Rolling Rolling: Max assist   Supine to sit: Max assist, +2 for physical assistance Sit to supine: Max assist, +2 for physical assistance        Transfers                         Balance Overall balance assessment: Needs assistance Sitting-balance support: Feet supported Sitting balance-Leahy Scale: Poor Sitting balance - Comments: Pt worked on balance at [employer] and inhibing strong RT lateral lean. First pt's LT hand placed on footboard of bed for grip. Pt briefly progressed to sitting with just Min As. Pt then used LUE to push himself right and changed to RT forearm side sitting position to inhibit push. Pt cued to go from this position to upright sitting and pt did initiate this with Min As. Sitting Balance fluctuated from Min to Max As throughout.  Attempted to have pt reach for objects while EOB but unable to successfully cue pt to engage. Postural control: Right lateral lean Standing balance support: Bilateral upper extremity supported Standing balance-Leahy Scale: Zero Standing balance comment: Max A +2, flexed posture, Stedy                           ADL either performed or assessed with clinical judgement   ADL Overall ADL's : Needs assistance/impaired Eating/Feeding: Maximal assistance;Total assistance;Bed level;Cueing for sequencing Eating/Feeding Details (indicate cue type and reason): Bed placed in chair position. Pt ignoring utensils despite multimodal cues and increased time. Dropped when placed in hand. Pt reaches for  his food with hands. Total Assist to eat with spoon. Max As with hand over hand to hold and sip from cup with straw.         Lower Body Bathing: Total assistance;Bed level;Cueing for sequencing Lower Body Bathing Details (indicate cue type and reason): Noted pt had partially  pulled off condem cath and wet. Gown removed and warm washcloth placed in pt's RT hand. Max multimodal and hand over hand cues to help pt initiate, however no iniitation despite increased processing time. Required Total Assit for LE bathing bed level. Upper Body Dressing : Maximal assistance;Cueing for sequencing;Bed level Upper Body Dressing Details (indicate cue type and reason): Used visual cue of gown and verbal cue and pt did assist with one hand threw arm of sleeve. Lower Body Dressing: Total assistance;Sitting/lateral leans     Toilet Transfer Details (indicate cue type and reason): Worked on STS from elevated EOB to [employer] x 2 reps with Max As of 2. Pt unable to tuck hips, so pt required use of chuck pad from bed to lift. Pt very stooped but per PT, pt stood more upright today than at last stand attempt. Pt made two self initiated posterior seated scoots onto bed with use of Stedy and with only CGA for safety. Toileting- Clothing Manipulation and Hygiene: Total assistance;+2 for safety/equipment;+2 for physical assistance;Bed level Toileting - Clothing Manipulation Details (indicate cue type and reason): Noted incontinent of bowel and required Total Assit.            Extremity/Trunk Assessment Upper Extremity Assessment Upper Extremity Assessment: LUE deficits/detail;RUE deficits/detail RUE Deficits / Details: Stiffness and rigidity with AAROM.  When pt moves RUE voltionally so as to reach for food, noted improved control. LUE Deficits / Details: Painful shoulder per daughter. Pt guarding with minimal use of LUE.       Cervical / Trunk Assessment Cervical / Trunk Assessment: Kyphotic    Vision   Additional Comments: Pt still too latererd in cognition to assess, but pt did respond to seeing a straw by opening mouth. Responded by seeing food on tray by reaching for food with RT hand.   Perception     Praxis     Communication Communication Communication: Impaired Factors  Affecting Communication: Difficulty expressing self   Cognition Arousal: Alert Behavior During Therapy: Flat affect, Impulsive Cognition: Cognition impaired   Orientation impairments: Person, Place, Time, Situation Awareness: Intellectual awareness impaired, Online awareness impaired Memory impairment (select all impairments):  (Non-verbal except for occassional "yeah" or "shit".) Attention impairment (select first level of impairment): Sustained attention, Focused attention Executive functioning impairment (select all impairments): Initiation, Sequencing, Reasoning, Organization, Problem solving                   Following commands: Impaired Following commands impaired: Follows one step commands inconsistently, Follows one step commands with increased time      Cueing   Cueing Techniques: Verbal cues, Gestural cues, Tactile cues, Visual cues  Exercises      Shoulder Instructions       General Comments      Pertinent Vitals/ Pain       Pain Assessment Pain Assessment: Faces Faces Pain Scale: Hurts little more Pain Location: LT shoulder per daughetr. Pain Descriptors / Indicators: Guarding, Grimacing Pain Intervention(s): Limited activity within patient's tolerance, Monitored during session, Repositioned  Home Living  Prior Functioning/Environment              Frequency  Min 2X/week        Progress Toward Goals  OT Goals(current goals can now be found in the care plan section)  Progress towards OT goals: Progressing toward goals  Acute Rehab OT Goals Patient Stated Goal: Per family, for pt to have opportunities to move and stand ad lib OT Goal Formulation: With family Time For Goal Achievement: 10/27/23 Potential to Achieve Goals: Fair  Plan      Co-evaluation    PT/OT/SLP Co-Evaluation/Treatment: Yes Reason for Co-Treatment: Complexity of the patient's impairments (multi-system  involvement);Necessary to address cognition/behavior during functional activity PT goals addressed during session: Mobility/safety with mobility;Balance OT goals addressed during session: ADL's and self-care;Strengthening/ROM      AM-PAC OT "6 Clicks" Daily Activity     Outcome Measure   Help from another person eating meals?: A Lot Help from another person taking care of personal grooming?: A Lot Help from another person toileting, which includes using toliet, bedpan, or urinal?: Total Help from another person bathing (including washing, rinsing, drying)?: A Lot Help from another person to put on and taking off regular upper body clothing?: A Lot Help from another person to put on and taking off regular lower body clothing?: Total 6 Click Score: 10    End of Session Equipment Utilized During Treatment: Gait belt  OT Visit Diagnosis: Other abnormalities of gait and mobility (R26.89);Unsteadiness on feet (R26.81);Other symptoms and signs involving cognitive function;Pain;Cognitive communication deficit (R41.841);Apraxia (R48.2) Pain - Right/Left: Left Pain - part of body: Shoulder   Activity Tolerance Patient tolerated treatment well   Patient Left in bed;with call bell/phone within reach;with bed alarm set;with family/visitor present   Nurse Communication Mobility status        Time: 4098-1191 OT Time Calculation (min): 33 min  Charges: OT General Charges $OT Visit: 1 Visit OT Treatments $Self Care/Home Management : 8-22 mins  [facility], OT Acute Rehab Services Office: [phone] 10/18/2023   Asher Blade 10/18/2023, 1:34 PM

## 2023-10-18 NOTE — Plan of Care (Signed)
  Problem: Fluid Volume: Goal: Ability to maintain a balanced intake and output will improve Outcome: Progressing   Problem: Nutritional: Goal: Maintenance of adequate nutrition will improve Outcome: Progressing Goal: Progress toward achieving an optimal weight will improve Outcome: Progressing   Problem: Skin Integrity: Goal: Risk for impaired skin integrity will decrease Outcome: Progressing   Problem: Tissue Perfusion: Goal: Adequacy of tissue perfusion will improve Outcome: Progressing   Problem: Clinical Measurements: Goal: Ability to maintain clinical measurements within normal limits will improve Outcome: Progressing Goal: Will remain free from infection Outcome: Progressing Goal: Diagnostic test results will improve Outcome: Progressing Goal: Respiratory complications will improve Outcome: Progressing Goal: Cardiovascular complication will be avoided Outcome: Progressing   Problem: Activity: Goal: Risk for activity intolerance will decrease Outcome: Progressing   Problem: Pain Managment: Goal: General experience of comfort will improve and/or be controlled Outcome: Progressing   Problem: Elimination: Goal: Will not experience complications related to bowel motility Outcome: Progressing Goal: Will not experience complications related to urinary retention Outcome: Progressing   Problem: Safety: Goal: Ability to remain free from injury will improve Outcome: Progressing

## 2023-10-19 ENCOUNTER — Inpatient Hospital Stay (HOSPITAL_COMMUNITY)

## 2023-10-19 DIAGNOSIS — Z515 Encounter for palliative care: Secondary | ICD-10-CM | POA: Diagnosis not present

## 2023-10-19 DIAGNOSIS — Z7189 Other specified counseling: Secondary | ICD-10-CM | POA: Diagnosis not present

## 2023-10-19 DIAGNOSIS — G9340 Encephalopathy, unspecified: Secondary | ICD-10-CM | POA: Diagnosis not present

## 2023-10-19 DIAGNOSIS — Z66 Do not resuscitate: Secondary | ICD-10-CM | POA: Diagnosis not present

## 2023-10-19 LAB — GLUCOSE, CAPILLARY
Glucose-Capillary: 83 mg/dL (ref 70–99)
Glucose-Capillary: 207 mg/dL — ABNORMAL HIGH (ref 70–99)
Glucose-Capillary: 208 mg/dL — ABNORMAL HIGH (ref 70–99)
Glucose-Capillary: 134 mg/dL — ABNORMAL HIGH (ref 70–99)

## 2023-10-19 LAB — KAPPA/LAMBDA LIGHT CHAINS
Kappa free light chain: 18 mg/L (ref 3.3–19.4)
Kappa, lambda light chain ratio: 1.46 (ref 0.26–1.65)
Lambda free light chains: 12.3 mg/L (ref 5.7–26.3)

## 2023-10-19 LAB — BASIC METABOLIC PANEL WITH GFR
Anion gap: 8 (ref 5–15)
BUN: 38 mg/dL — ABNORMAL HIGH (ref 8–23)
CO2: 22 mmol/L (ref 22–32)
Calcium: 8 mg/dL — ABNORMAL LOW (ref 8.9–10.3)
Chloride: 104 mmol/L (ref 98–111)
Creatinine, Ser: 1.66 mg/dL — ABNORMAL HIGH (ref 0.61–1.24)
GFR, Estimated: 40 mL/min — ABNORMAL LOW (ref >60)
Glucose, Bld: 61 mg/dL — ABNORMAL LOW (ref 70–99)
Potassium: 3.4 mmol/L — ABNORMAL LOW (ref 3.5–5.1)
Sodium: 134 mmol/L — ABNORMAL LOW (ref 135–145)

## 2023-10-19 LAB — PROTEIN ELECTROPHORESIS, SERUM
A/G Ratio: 0.5 — ABNORMAL LOW (ref 0.7–1.7)
Albumin ELP: 2.2 g/dL — ABNORMAL LOW (ref 2.9–4.4)
Alpha-1-Globulin: 0.2 g/dL (ref 0.0–0.4)
Alpha-2-Globulin: 0.7 g/dL (ref 0.4–1.0)
Beta Globulin: 0.6 g/dL — ABNORMAL LOW (ref 0.7–1.3)
Gamma Globulin: 3.2 g/dL — ABNORMAL HIGH (ref 0.4–1.8)
Globulin, Total: 4.7 g/dL — ABNORMAL HIGH (ref 2.2–3.9)
Total Protein ELP: 6.9 g/dL (ref 6.0–8.5)

## 2023-10-19 LAB — IGG 1, 2, 3, AND 4
IgG (Immunoglobin G), Serum: 3661 mg/dL — ABNORMAL HIGH (ref 603–1613)
IgG, Subclass 1: 1881 mg/dL — ABNORMAL HIGH (ref 248–810)
IgG, Subclass 2: 1016 mg/dL — ABNORMAL HIGH (ref 130–555)
IgG, Subclass 3: 203 mg/dL — ABNORMAL HIGH (ref 15–102)
IgG, Subclass 4: 82 mg/dL (ref 2–96)

## 2023-10-19 LAB — MAGNESIUM: Magnesium: 2 mg/dL (ref 1.7–2.4)

## 2023-10-19 LAB — OLIGOCLONAL BANDS, CSF + SERM

## 2023-10-19 LAB — PHOSPHORUS: Phosphorus: 2.3 mg/dL — ABNORMAL LOW (ref 2.5–4.6)

## 2023-10-19 MED ORDER — POTASSIUM PHOSPHATES 15 MMOLE/5ML IV SOLN
30.0000 mmol | Freq: Once | INTRAVENOUS | Status: AC
  Administered 2023-10-19: 30 mmol via INTRAVENOUS
  Filled 2023-10-19: qty 10

## 2023-10-19 NOTE — Progress Notes (Signed)
 LTM EEG hooked up and recording with CT compatible leads. [facility] monitoring. Test button tested.

## 2023-10-19 NOTE — Plan of Care (Signed)
   Problem: Fluid Volume: Goal: Ability to maintain a balanced intake and output will improve Outcome: Progressing   Problem: Metabolic: Goal: Ability to maintain appropriate glucose levels will improve Outcome: Progressing   Problem: Nutritional: Goal: Maintenance of adequate nutrition will improve Outcome: Progressing Goal: Progress toward achieving an optimal weight will improve Outcome: Progressing   Problem: Skin Integrity: Goal: Risk for impaired skin integrity will decrease Outcome: Progressing   Problem: Tissue Perfusion: Goal: Adequacy of tissue perfusion will improve Outcome: Progressing

## 2023-10-19 NOTE — Progress Notes (Signed)
 NEUROLOGY CONSULT FOLLOW UP NOTE   Date of service: October 19, 2023 Patient Name: Connor Dixon MRN:  [id number] DOB:  January 31, 1935  Interval Hx/subjective   Family at the bedside. Patient is sitting up in the bed in NAD eating his breakfast. He is alert and awake. Family feels patient is overall improved since starting IVIG but not back to prior baseline. 5 days of IVIG completed yesterday.  Vitals   Vitals:   10/19/23 0316 10/19/23 0750 10/19/23 1125 10/19/23 1604  BP: 134/63 100/69 (!) 143/75 (!) 126/59  Pulse:  76 78 70  Resp: 17  18 18   Temp: 98.3 F (36.8 C) 98.6 F (37 C) 98.6 F (37 C) 98.5 F (36.9 C)  TempSrc:  Oral Axillary Oral  SpO2: 99% 100% 100% 99%  Weight:         Body mass index is 30.5 kg/m.  Physical Exam   Constitutional: elderly male in NAD  Psych: Affect appropriate to situation.   Eyes: No scleral injection.   HENT: No OP obstrucion.   Head: Normocephalic.   Cardiovascular: Normal rate and regular rhythm.   Respiratory: Effort normal, non-labored breathing.   GI: Soft.  No distension. There is no tenderness.   Skin: WDI.    Neurologic Examination    MS: Opens eyes, consistently tracks examiner. Says "yes" to all questions. Does not follow simple commands. CN: tracking Blinks to threat bilaterally, facial movement symmetric, no dysarthria, hearing intact to voice, shoulder shrug symmetric, tongue protrusion midline.  Motor/Sensory: He moves all extremities spontaneously.Withdraws in all extremities.   Medications  Current Facility-Administered Medications:    acetaminophen  (TYLENOL ) tablet 650 mg, 650 mg, Oral, Q4H PRN, 650 mg at 10/13/23 0555 **OR** acetaminophen  (TYLENOL ) 160 MG/5ML solution 650 mg, 650 mg, Per Tube, Q4H PRN **OR** acetaminophen  (TYLENOL ) suppository 650 mg, 650 mg, Rectal, Q4H PRN, Doutova, Anastassia, MD   amLODipine (NORVASC) tablet 10 mg, 10 mg, Oral, Daily, Wouk, Haynes Lips, MD, 10 mg at 10/19/23 1052   apixaban  (ELIQUIS )  tablet 2.5 mg, 2.5 mg, Oral, BID, Swayze, Ava, DO, 2.5 mg at 10/19/23 1052   cyanocobalamin (VITAMIN B12) injection 1,000 mcg, 1,000 mcg, Subcutaneous, Daily, Girguis, David, MD, 1,000 mcg at 10/19/23 1054   folic acid injection 1 mg, 1 mg, Intravenous, Daily, Faith Homes, MD, 1 mg at 10/19/23 1052   hydrALAZINE (APRESOLINE) tablet 10 mg, 10 mg, Oral, Q8H PRN, Swayze, Ava, DO, 10 mg at 10/15/23 1517   insulin  aspart (novoLOG ) injection 0-20 Units, 0-20 Units, Subcutaneous, TID WC, Swayze, Ava, DO, 7 Units at 10/19/23 1630   insulin  glargine-yfgn (SEMGLEE ) injection 20 Units, 20 Units, Subcutaneous, QHS, Swayze, Ava, DO, 20 Units at 10/18/23 2228   lactose free nutrition (BOOST PLUS) liquid 237 mL, 237 mL, Oral, BID BM, Wouk, Haynes Lips, MD, 237 mL at 10/19/23 1317   LORazepam  (ATIVAN ) injection 2 mg, 2 mg, Intravenous, Once PRN, Wouk, Haynes Lips, MD   metoprolol tartrate (LOPRESSOR) tablet 12.5 mg, 12.5 mg, Oral, BID, Wouk, Haynes Lips, MD, 12.5 mg at 10/19/23 1052   potassium PHOSPHATE 30 mmol in dextrose  5 % 500 mL infusion, 30 mmol, Intravenous, Once, Faith Homes, MD, Last Rate: 85 mL/hr at 10/19/23 1843, 30 mmol at 10/19/23 1843  Labs and Diagnostic Imaging   CBC:  Recent Labs  Lab 10/15/23 1016 10/16/23 0541  WBC 10.3 5.3  NEUTROABS 9.1* 3.8  HGB 16.0 16.1  HCT 47.6 48.4  MCV 90.2 92.0  PLT 201 151    Basic  Metabolic Panel:  Lab Results  Component Value Date   NA 134 (L) 10/19/2023   K 3.4 (L) 10/19/2023   CO2 22 10/19/2023   GLUCOSE 61 (L) 10/19/2023   BUN 38 (H) 10/19/2023   CREATININE 1.66 (H) 10/19/2023   CALCIUM 8.0 (L) 10/19/2023   GFRNONAA 40 (L) 10/19/2023   GFRAA 32 (L) 12/10/2019   Lipid Panel:  Lab Results  Component Value Date   LDLCALC 68 10/11/2023   HgbA1c:  Lab Results  Component Value Date   HGBA1C 8.7 (H) 09/06/2023   Urine Drug Screen:     Component Value Date/Time   LABOPIA NONE DETECTED 10/10/2023 1747   COCAINSCRNUR NONE  DETECTED 10/10/2023 1747   LABBENZ NONE DETECTED 10/10/2023 1747   AMPHETMU NONE DETECTED 10/10/2023 1747   THCU NONE DETECTED 10/10/2023 1747   LABBARB NONE DETECTED 10/10/2023 1747    Alcohol Level     Component Value Date/Time   ETH <15 10/17/2023 1944   INR  Lab Results  Component Value Date   INR 1.2 10/10/2023   APTT  Lab Results  Component Value Date   APTT 37 (H) 10/10/2023   AED levels: No results found for: "PHENYTOIN", "ZONISAMIDE", "LAMOTRIGINE", "LEVETIRACETA"  CT Head without contrast(Personally reviewed): No acute intracranial abnormality. ASPECTS 10   CT angio Head and Neck with contrast(Personally reviewed): No LVO. No evidence of significant stenosis, aneurysmal dilatation, or dissection involving the arteries of the head and neck  Repeat CT Head without contrast 5/28:  No acute intracranial abnormality.Generalized atrophy and findings of chronic microvascular disease.   MRI Brain(Personally reviewed): No acute intracranial abnormality by noncontrast MRI. Signal changes compatible with chronic small vessel disease. Generalized appearing brain volume loss.   MRI brain w/wo:  No acute intracranial abnormality. Moderate atrophy and white matter disease bilaterally. 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  Labs  Serum paraneoplastic negative  CSF [facility] autoimmune panel pending   Assessment   Connor Dixon is a 89 y.o. male who presented with severe encephalopathy which is currently of unclear etiology.  Continues to show improvement daily, unclear if this is due to the antibiotics or simply natural history.  No evidence of seizure on prolonged EEG, no evidence of stroke on MRI.     He continues to improve but very slowly, possibilities including seizures secondary to septic encephalopathy of unclear source with response to antibiotics, delirium of unclear etiology,  autoimmune encephalopathy with some response seen from IVIG treatment. Continuous EEG was negative, and his exam did not seem very consistent with seizure on arrival.   Extensive workup for CNS processes has not shown a clear etiology. Patient continues to show some improvement each day. IVIG completed 6/4. Benefit from IVIG may be appear/increase up to 2 weeks after 5 day course is completed. For now, watchful waiting and PT/OT. He has low cognitive reserve and recovery, if possible, will take time. Serum paraneoplastic panel is negative. We are still awaiting Autoimmune labs sent to [facility] clinic are pending. Unless [facility] panel shows (+) autoantibody in CSF I would not recommend further immunotherapy at this time.  D/w family at bedside  Recommendations  -Follow-up autoimmune labs are still pending  - OOB as able - Delirium Precautions - Day/night/sleep interventions - PT/OT   Will continue to follow ______________________________________________________________________  Greg Leaks, MD [facility] [phone]  If 7pm- 7am, please page neurology on call as listed in  [facility].

## 2023-10-19 NOTE — Progress Notes (Signed)
 Speech Language Pathology Treatment: Dysphagia;Cognitive-Linquistic  Patient Details Name: Connor Dixon MRN: [id number] DOB: [birth year] Today's Date: 10/19/2023 Time: 4696-2952 SLP Time Calculation (min) (ACUTE ONLY): 22 min  Assessment / Plan / Recommendation Clinical Impression  Pt seen for dysphagia and aphasia/cognition with son and daughter-in-law present. They report he has had a good appetite with puree texture and no difficulties. With hand over hand assist/guidance he helped give himself pudding and attempted to help hold the cup. There were no s/s aspiration and swallows appeared timely. Gave upgraded texture of graham cracker which he was able to masticate in adequate amount of time without residue, however given his decreased attention and risk for pocketing, family in agreement to continue puree for now. May be able to trial Dys 2 tray next week.   Pt's spontaneous expression continues to consist of "yeah" and "alright" to all questions. During singing familiar song he was able to approximate tune slightly and was accurate for 3 words. He did not comprehend one step commands given verbal or written cues or imitate therapist. Used hand over hand assist to follow simple commands. Attempted simple yes/no responses using written cue and gestures (thumbs up) but pt responds "yeah" to all questions. He did say "yes" when shown yes/no communication board. Pt continues to need cues to sustain attention during tasks frequently, distracted by gown periodically. Encouraged family to continue using simple language, gestures and singing with pt to facilitate communication.    HPI HPI: Pt is 89 year old presented to [facility] on  10/10/23 after found at home unresponsive. Pt with dense encephalopathy with flexor positioning of UE's. CT negative for acute changes and MRI reports "No acute intracranial abnormality by noncontrast MRI.  2. Signal changes compatible with chronic small vessel disease. Generalized  appearing brain volume loss."  PMH - afib on Eliquis , pacer, HTN, CKD, DM, obesity      SLP Plan  Continue with current plan of care      Recommendations for follow up therapy are one component of a multi-disciplinary discharge planning process, led by the attending physician.  Recommendations may be updated based on patient status, additional functional criteria and insurance authorization.    Recommendations  Diet recommendations: Dysphagia 1 (puree);Thin liquid Liquids provided via: Straw Medication Administration: Crushed with puree Supervision: Full supervision/cueing for compensatory strategies;Trained caregiver to feed patient Compensations: Minimize environmental distractions;Slow rate;Small sips/bites;Lingual sweep for clearance of pocketing Postural Changes and/or Swallow Maneuvers: Seated upright 90 degrees                  Oral care BID;Oral care before and after PO   Frequent or constant Supervision/Assistance Dysphagia, unspecified (R13.10);Aphasia (R47.01);Cognitive communication deficit (W41.324)     Continue with current plan of care     [name]  10/19/2023, 9:55 AM

## 2023-10-19 NOTE — Progress Notes (Signed)
 Progress Note    Connor Dixon   ZOX:[id number]  DOB: 14-Jun-1934  DOA: 10/10/2023     9 PCP: Connor Knuckles, MD  Initial CC: Found down at home  Hospital Course: [name] is an 89 yo male with PMH chronic diastolic CHF, complete AV block s/p pacemaker, CKD 3, diabetes, HTN, HLD who presented after being found slumped over in his chair at home and unresponsive. He was spoken to on the phone on the evening of 10/09/2023 and upon being found on 10/10/2023 by the cleaner he was noted to be unresponsive slumped over the chair. At baseline he is considered high functioning and ambulates independently and lives alone.  He was admitted for further workup of altered mentation.   In the ED the patient underwent a CT head that demonstrated no acute process. A CTA head and neck demonstrated no LVO. There was however evidence of a remote lacunar infarct of the anterior right corona radiata. There was concern for Seizure, Posterior Circulation CVA, and meningitis. The patient will need to be off of anticoagulation for at least 48 hours. LP is planned for 10/12/2023. The patient will be started on empiric antibiotic coverage for meningitis.    MRI was performed and demonstrated no acute intracranial abnormality. There was evidence for chronic small vessel disease. There was also generalized appearing brain volume loss. EEG unremarkable. Neurology has evaluated the patient. Plan is for LP on the afternoon of 10/12/2023.    On 10/13/2023 the patient again appears completely obtunded. This may be due to ativan  given for MRI on 10/12/2023, but in any case the patient is not as alert as he was on 10/12/2023.    MRI brain has ruled out CVA.   LP performed on 10/13/2023 has ruled out meningitis. Have added on VDRL of CSF onto studies for the CSF. Antibiotics for meningitis stopped as per neurology. However, steroids continued in case there is an autoimmune component to the patient's illness.  Steroid was  discontinued on 10/14/2023.   DDx continues to include autoimmune diseases and tertiary syphillis. HIV testing has also been ordered.   10/14/2023 the patient is much improved according to the family at bedside. The patient has cracked a joke with the phlebotomist, eaten some breakfast, and been able to open his phone with his password. He has spoken. At the time of my visit, the patient is certainly more alert. He is non-verbal with me, but does track and attend to me.   Will continue IVIG and steroids. Consult PT/OT.   10/15/2023 Patient still appears improved, but not as interactive as 10/14/2023. Family states that he is just sleepy after eating breakfast.  As of 10/18/2023 also not speaking much or making sense.  Intermittently will follow some commands.    Assessment and Plan:    Acute encephalopathy - MRI neg for stroke. EEG no seizure like activity. LP no meningitis. Neurology thinks possible autoimmune encephalitis and completes 5 days IVIG on 10/18/23 but hasn't made much difference per family  - MRI w/wo contrast on 6/3 negative for acute findings also (remote infarcts) - B12 low/normal on 6/1 but no replacement; will replete now - will check folate level at this point too; folate low at 4.5, so will also start repletion  - EEG testing being repeated 6/5; follow up results   Essential hypertension Bp mild/mod elevation - started amlodipine (home losart and coreg  are on hold)   Elevated serum osm gap Etiology unclear. Not acidotic. Family thinks toxic  alcohol ingestion unlikely. Discussed with dr. Arno Bibles of hematology, would be abnormal for paraproteinemia to present with purely encephalopathy, he advises following labs ordered. - kappa/lambda light chain ratio, SPEP, and immunoglobulin levels.    Elevated troponin Appears to be demand - Echo unrmarkable   Acute urinary retention - resolved -Foley discontinued 6/2   Hyperlipidemia with target LDL less than 70 Not able to tolerate  PO statin at this time   Insulin -requiring or dependent type II diabetes mellitus (HCC) Glucose improved Continue basal/bolus/ssi   Paroxysmal atrial fibrillation (HCC) Continue apixaban  Continue metop     Sinus node dysfunction (HCC) - s/p pacemaker   Prolonged QT interval - will monitor on tele avoid QT prolonging medications, rehydrate correct electrolytes  Interval History:  No events overnight.  Mentation remains the same.  Mostly yes/no answers inappropriately.  Does not follow commands.  He does withdraw to pain appropriately still.   Old records reviewed in assessment of this patient  Antimicrobials:  DVT prophylaxis:  apixaban  (ELIQUIS ) tablet 2.5 mg Start: 10/15/23 1430 SCDs Start: 10/15/23 1335 SCD's Start: 10/10/23 2338 apixaban  (ELIQUIS ) tablet 2.5 mg   Code Status:   Code Status: Limited: Do not attempt resuscitation (DNR) -DNR-LIMITED -Do Not Intubate/DNI   Mobility Assessment (Last 72 Hours)     Mobility Assessment     Row Name 10/18/23 2000 10/18/23 1341 10/18/23 1331 10/18/23 0755 10/18/23 0545   Does patient have an order for bedrest or is patient medically unstable No - Continue assessment -- -- No - Continue assessment No - Continue assessment   What is the highest level of mobility based on the progressive mobility assessment? Level 1 (Bedfast) - Unable to balance while sitting on edge of bed Level 1 (Bedfast) - Unable to balance while sitting on edge of bed Level 1 (Bedfast) - Unable to balance while sitting on edge of bed Level 1 (Bedfast) - Unable to balance while sitting on edge of bed Level 1 (Bedfast) - Unable to balance while sitting on edge of bed   Is the above level different from baseline mobility prior to current illness? Yes - Recommend PT order -- -- Yes - Recommend PT order Yes - Recommend PT order    Row Name 10/18/23 0400 10/18/23 0010 10/17/23 2125 10/17/23 2000 10/17/23 1944   Does patient have an order for bedrest or is patient  medically unstable No - Continue assessment No - Continue assessment No - Continue assessment No - Continue assessment No - Continue assessment   What is the highest level of mobility based on the progressive mobility assessment? Level 1 (Bedfast) - Unable to balance while sitting on edge of bed Level 1 (Bedfast) - Unable to balance while sitting on edge of bed Level 1 (Bedfast) - Unable to balance while sitting on edge of bed Level 1 (Bedfast) - Unable to balance while sitting on edge of bed Level 1 (Bedfast) - Unable to balance while sitting on edge of bed   Is the above level different from baseline mobility prior to current illness? Yes - Recommend PT order Yes - Recommend PT order Yes - Recommend PT order Yes - Recommend PT order Yes - Recommend PT order    Row Name 10/17/23 0800 10/17/23 0600 10/17/23 0410 10/17/23 0200 10/16/23 2354   Does patient have an order for bedrest or is patient medically unstable Yes- Bedfast (Level 1) - Complete No - Continue assessment No - Continue assessment No - Continue assessment No - Continue assessment  What is the highest level of mobility based on the progressive mobility assessment? Level 1 (Bedfast) - Unable to balance while sitting on edge of bed Level 1 (Bedfast) - Unable to balance while sitting on edge of bed Level 1 (Bedfast) - Unable to balance while sitting on edge of bed Level 1 (Bedfast) - Unable to balance while sitting on edge of bed Level 3 (Stands with assist) - Balance while standing  and cannot march in place   Is the above level different from baseline mobility prior to current illness? Yes - Recommend PT order Yes - Recommend PT order Yes - Recommend PT order Yes - Recommend PT order Yes - Recommend PT order    Row Name 10/16/23 2200 10/16/23 2000         Does patient have an order for bedrest or is patient medically unstable No - Continue assessment No - Continue assessment      What is the highest level of mobility based on the  progressive mobility assessment? Level 3 (Stands with assist) - Balance while standing  and cannot march in place Level 3 (Stands with assist) - Balance while standing  and cannot march in place      Is the above level different from baseline mobility prior to current illness? Yes - Recommend PT order Yes - Recommend PT order               Barriers to discharge:  Disposition Plan:  TBD HH orders placed: TBD Status is: Inpt  Objective: Blood pressure (!) 143/75, pulse 78, temperature 98.6 F (37 C), temperature source Axillary, resp. rate 18, weight 91 kg, SpO2 100%.  Examination:  Physical Exam Constitutional:      Comments: Awake and resting in bed in no distress but not fully able to interact  HENT:     Head: Normocephalic and atraumatic.     Mouth/Throat:     Mouth: Mucous membranes are moist.  Eyes:     Extraocular Movements: Extraocular movements intact.  Cardiovascular:     Rate and Rhythm: Normal rate and regular rhythm.  Pulmonary:     Effort: Pulmonary effort is normal. No respiratory distress.     Breath sounds: Normal breath sounds. No wheezing.  Abdominal:     General: Bowel sounds are normal. There is no distension.     Palpations: Abdomen is soft.     Tenderness: There is no abdominal tenderness.  Musculoskeletal:        General: Normal range of motion.     Cervical back: Normal range of motion and neck supple.  Skin:    General: Skin is warm and dry.  Neurological:     Comments: Mostly answers yes to questions.  Could follow a couple commands intermittently.  Withdraws to pain in all 4 extremities appropriately      Consultants:  Neurology  Procedures:    Data Reviewed: Results for orders placed or performed during the hospital encounter of 10/10/23 (from the past 24 hours)  Glucose, capillary     Status: Abnormal   Collection Time: 10/18/23  4:05 PM  Result Value Ref Range   Glucose-Capillary 301 (H) 70 - 99 mg/dL  Glucose, capillary      Status: None   Collection Time: 10/18/23  9:49 PM  Result Value Ref Range   Glucose-Capillary 74 70 - 99 mg/dL   Comment 1 Notify RN    Comment 2 Document in Chart   Basic metabolic panel  Status: Abnormal   Collection Time: 10/19/23  6:12 AM  Result Value Ref Range   Sodium 134 (L) 135 - 145 mmol/L   Potassium 3.4 (L) 3.5 - 5.1 mmol/L   Chloride 104 98 - 111 mmol/L   CO2 22 22 - 32 mmol/L   Glucose, Bld 61 (L) 70 - 99 mg/dL   BUN 38 (H) 8 - 23 mg/dL   Creatinine, Ser 1.61 (H) 0.61 - 1.24 mg/dL   Calcium 8.0 (L) 8.9 - 10.3 mg/dL   GFR, Estimated 40 (L) >60 mL/min   Anion gap 8 5 - 15  Magnesium     Status: None   Collection Time: 10/19/23  6:12 AM  Result Value Ref Range   Magnesium 2.0 1.7 - 2.4 mg/dL  Phosphorus     Status: Abnormal   Collection Time: 10/19/23  6:12 AM  Result Value Ref Range   Phosphorus 2.3 (L) 2.5 - 4.6 mg/dL  Glucose, capillary     Status: None   Collection Time: 10/19/23  6:39 AM  Result Value Ref Range   Glucose-Capillary 83 70 - 99 mg/dL  Glucose, capillary     Status: Abnormal   Collection Time: 10/19/23 11:20 AM  Result Value Ref Range   Glucose-Capillary 207 (H) 70 - 99 mg/dL   Comment 1 Notify RN     I have reviewed pertinent nursing notes, vitals, labs, and images as necessary. I have ordered labwork to follow up on as indicated.  I have reviewed the last notes from staff over past 24 hours. I have discussed patient's care plan and test results with nursing staff, CM/SW, and other staff as appropriate.  Time spent: Greater than 50% of the 55 minute visit was spent in counseling/coordination of care for the patient as laid out in the A&P.   LOS: 9 days   Faith Homes, MD [facility] 10/19/2023, 3:21 PM

## 2023-10-19 NOTE — Progress Notes (Signed)
 Daily Progress Note   Date: 10/19/2023   Patient Name: Connor Dixon  DOB: April 27, 1935  MRN: [id number]  Age / Sex: 89 y.o., male  Attending Physician: Faith Homes, MD Primary Care Physician: Arcadio Knuckles, MD Admit Date: 10/10/2023 Length of Stay: 9 days  Reason for Consultation: Establishing goals of care  Past Medical History:  Diagnosis Date   Acute on chronic diastolic congestive heart failure (HCC)    Arthus phenomenon    Atrioventricular block, complete (HCC)    Cardiac pacemaker in situ 07/22/2010   Chronic kidney disease, stage 3 (HCC)    Presumably from longstanding DM & HTN. Scr has gradually increased over the past 2 years (1.6 in 12/2010, 2 in 03/2011, and up to 2.1 in 01/2013)   DIABETES MELLITUS, TYPE I, ADULT ONSET 08/06/2007   DM (diabetes mellitus) (HCC)    adult onset    Essential hypertension, benign 08/06/2007   Very good BP control   History of second degree heart block    HLD (hyperlipidemia)    HYPERLIPIDEMIA 08/06/2007   Hypopotassemia    Morbid obesity (HCC)    Obesity    Other and unspecified angina pectoris    typical   Type I (juvenile type) diabetes mellitus without mention of complication, not stated as uncontrolled    (04/25/13 OV with Dr. Irene Mannheim) = Poorly controlled & is working  w/Dr. Alphia Asa to improve this. Most recent Hgb A1c was 8.2% but Mr. Grieshop reports it was down to 7.6 last month.    Subjective:   Subjective: Chart Reviewed. Updates received. Patient Assessed. Created space and opportunity for patient  and family to explore thoughts and feelings regarding current medical situation.  Today's Discussion: Today before meeting with the patient/family, I reviewed the chart including SLP note from today, hospitalist note from later today yesterday.  Patient seems doing be improving, tolerated graham cracker though they are holding off on advancing diet because of mental status until early next week.  Some mild improvement with PT/OT  yesterday.  I also reviewed labs today including BMP which shows improvement in sodium though still hyponatremic stable creatinine/kidney function, blood sugars a little lower today but no significant hypoglycemic episodes.  Today I went to the bedside to see the patient.  His son and daughter-in-law are present.  Later in my visit his other son arrived.  The patient was alert, interactive.  He ate 75% of his breakfast this morning.  We discussed how good it is that he is making some improvements with PT/OT and that we will "celebrate all the victories, even the small ones."  We also noted that we are thankful that he is eating and seems to be eating quite well.  I shared that when patients have altered mental status, as their father does, oftentimes we get concerned about the need to discuss option of feeding tube.  His son was very clear that "feeding tube would not be an option if that were the case."  I shared that fortunately we are not having that discussion at this point and we will hope for continued good appetite.  Family is in agreement.  Goals at this point are continued DNR-limited, full scope of care otherwise.  However likely would not except a feeding tube.  Will continue to work towards improvement and SNF placement for rehab.  I shared that palliative medicine continue to follow along and ensured they had our contact information for any questions or concerns.  I provided emotional and  general support through therapeutic listening, empathy, sharing of stories, and other techniques. I answered all questions and addressed all concerns to the best of my ability.  Review of Systems  Unable to perform ROS   Objective:   Primary Diagnoses: Present on Admission:  Acute encephalopathy  Elevated troponin  Essential hypertension  Hyperlipidemia with target LDL less than 70  Paroxysmal atrial fibrillation (HCC)  Sinus node dysfunction (HCC)  Prolonged QT interval   Vital Signs:  BP (!)  143/75 (BP Location: Left Wrist)   Pulse 78   Temp 98.6 F (37 C) (Axillary)   Resp 18   Wt 91 kg   SpO2 100%   BMI 30.50 kg/m   Physical Exam Vitals and nursing note reviewed.  Constitutional:      General: He is not in acute distress.    Appearance: He is ill-appearing. He is not toxic-appearing.  HENT:     Head: Normocephalic and atraumatic.  Pulmonary:     Effort: Pulmonary effort is normal. No respiratory distress.  Abdominal:     General: Abdomen is flat.  Skin:    General: Skin is warm and dry.  Neurological:     Mental Status: He is alert.     Comments: Patient more verbal, expressive.  Family notes he is picking up more words.  Psychiatric:        Mood and Affect: Mood normal.        Behavior: Behavior normal.     Palliative Assessment/Data: 30-40%   Existing Vynca/ACP Documentation: None  Assessment & Plan:   HPI/Patient Profile:  89 y.o. male  with past medical history of A-fib (Eliquis ), s/p PPM for second-degree heart block, HTN, CKD (stage III), type 1 diabetes, morbid obesity, and HLD admitted on 10/10/2023 after being found down at home.  Last known well was 5/26 at 1700.   EGD revealed moderate diffuse encephalopathy with no seizures or epileptiform discharges noted.  Overnight EEG results pending.  Pending.  Workup remains in progress.  CT of the head revealed remote lacunar infarct in the anterior right corona radiata.  CTA revealed no evidence of significant stenosis, aneurysmal dilatation, or dissection involving the arteries of the head and neck. MRI WNL. LP to be performed today.    PMT was consulted to support patient and family with goals of care discussions.  SUMMARY OF RECOMMENDATIONS   DNR-limited Continue current scope of care Family remains hopeful for improvement Ongoing supportive patient and family Family placing limit of no permanent feeding tube/PEG Palliative medicine will follow-up early next week if still admitted Please notify  us  of any significant clinical change or new palliative needs in the interim  Symptom Management:  Per primary team PMT is available to assist as needed  Code Status: DNR-limited  Prognosis: Unable to determine  Discharge Planning: SNF/Rehab  Discussed with: Patient's family, medical team, nursing team  Thank you for allowing us  to participate in the care of Connor Dixon PMT will continue to support holistically.  Billing based on MDM: Moderate  Detailed review of medical records (labs, imaging, vital signs), medically appropriate exam, discussed with treatment team, counseling and education to patient, family, & staff, documenting clinical information, medication management, coordination of care  Lizbeth Right, NP Palliative Medicine Team  Team Phone # [phone] (Nights/Weekends)  01/12/2021, 8:17 AM

## 2023-10-19 NOTE — TOC Progression Note (Signed)
 Transition of Care [facility]) - Progression Note    Patient Details  Name: Connor Dixon MRN: [id number] Date of Birth: March 05, 1935  [facility] Care [facility]) CM/SW Contact  Tandy Fam, [place] Phone Number: 10/19/2023, 12:24 PM  Clinical Narrative:   CSW received call from son, Landon Pinion, that family is interested in [facility], [address], and [place]. [facility] has accepted, but Loss adjuster, chartered are still pending. CSW asked [facility] and [facility] to review. CSW updated Landon Pinion, he was appreciative of update. CSW to follow.    Expected Discharge Plan: Skilled Nursing Facility Barriers to Discharge: Insurance Authorization, Continued Medical Workup  Expected Discharge Plan and Services                                               Social Determinants of Health (SDOH) Interventions SDOH Screenings   Food Insecurity: Patient Unable To Answer (10/13/2023)  Housing: Patient Unable To Answer (10/13/2023)  Transportation Needs: Patient Unable To Answer (10/13/2023)  Utilities: Patient Unable To Answer (10/13/2023)  Alcohol Screen: Low Risk  (04/06/2023)  Depression (PHQ2-9): Low Risk  (09/06/2023)  Financial Resource Strain: Low Risk  (04/06/2023)  Physical Activity: Inactive (04/06/2023)  Social Connections: Unknown (10/13/2023)  Stress: No Stress Concern Present (04/06/2023)  Tobacco Use: Low Risk  (10/10/2023)  Health Literacy: Adequate Health Literacy (04/06/2023)    Readmission Risk Interventions     No data to display

## 2023-10-19 NOTE — Progress Notes (Deleted)
 Test

## 2023-10-20 ENCOUNTER — Inpatient Hospital Stay (HOSPITAL_COMMUNITY)

## 2023-10-20 DIAGNOSIS — G9340 Encephalopathy, unspecified: Secondary | ICD-10-CM | POA: Diagnosis not present

## 2023-10-20 DIAGNOSIS — R569 Unspecified convulsions: Secondary | ICD-10-CM | POA: Diagnosis not present

## 2023-10-20 LAB — GLUCOSE, CAPILLARY
Glucose-Capillary: 96 mg/dL (ref 70–99)
Glucose-Capillary: 91 mg/dL (ref 70–99)
Glucose-Capillary: 226 mg/dL — ABNORMAL HIGH (ref 70–99)
Glucose-Capillary: 155 mg/dL — ABNORMAL HIGH (ref 70–99)
Glucose-Capillary: 201 mg/dL — ABNORMAL HIGH (ref 70–99)

## 2023-10-20 LAB — CBC WITH DIFFERENTIAL/PLATELET
Abs Immature Granulocytes: 0.06 10*3/uL (ref 0.00–0.07)
Basophils Absolute: 0 10*3/uL (ref 0.0–0.1)
Basophils Relative: 0 %
Eosinophils Absolute: 0.2 10*3/uL (ref 0.0–0.5)
Eosinophils Relative: 3 %
HCT: 38.9 % — ABNORMAL LOW (ref 39.0–52.0)
Hemoglobin: 13.6 g/dL (ref 13.0–17.0)
Immature Granulocytes: 1 %
Lymphocytes Relative: 21 %
Lymphs Abs: 1.1 10*3/uL (ref 0.7–4.0)
MCH: 30.8 pg (ref 26.0–34.0)
MCHC: 35 g/dL (ref 30.0–36.0)
MCV: 88 fL (ref 80.0–100.0)
Monocytes Absolute: 1 10*3/uL (ref 0.1–1.0)
Monocytes Relative: 19 %
Neutro Abs: 2.9 10*3/uL (ref 1.7–7.7)
Neutrophils Relative %: 56 %
Platelets: 119 10*3/uL — ABNORMAL LOW (ref 150–400)
RBC: 4.42 MIL/uL (ref 4.22–5.81)
RDW: 13.7 % (ref 11.5–15.5)
WBC: 5.2 10*3/uL (ref 4.0–10.5)
nRBC: 0 % (ref 0.0–0.2)

## 2023-10-20 LAB — BASIC METABOLIC PANEL WITH GFR
Anion gap: 9 (ref 5–15)
BUN: 34 mg/dL — ABNORMAL HIGH (ref 8–23)
CO2: 22 mmol/L (ref 22–32)
Calcium: 8.1 mg/dL — ABNORMAL LOW (ref 8.9–10.3)
Chloride: 104 mmol/L (ref 98–111)
Creatinine, Ser: 1.67 mg/dL — ABNORMAL HIGH (ref 0.61–1.24)
GFR, Estimated: 40 mL/min — ABNORMAL LOW (ref >60)
Glucose, Bld: 113 mg/dL — ABNORMAL HIGH (ref 70–99)
Potassium: 4.1 mmol/L (ref 3.5–5.1)
Sodium: 135 mmol/L (ref 135–145)

## 2023-10-20 LAB — PHOSPHORUS: Phosphorus: 3.4 mg/dL (ref 2.5–4.6)

## 2023-10-20 LAB — MAGNESIUM: Magnesium: 1.9 mg/dL (ref 1.7–2.4)

## 2023-10-20 LAB — VITAMIN B1: Vitamin B1 (Thiamine): 107.9 nmol/L (ref 66.5–200.0)

## 2023-10-20 MED ORDER — ORAL CARE MOUTH RINSE
15.0000 mL | OROMUCOSAL | Status: DC | PRN
Start: 2023-10-20 — End: 2023-11-08

## 2023-10-20 NOTE — Progress Notes (Signed)
 Progress Note    Connor Dixon   JYN:[id number]  DOB: 06-May-1935  DOA: 10/10/2023     10 PCP: Arcadio Knuckles, MD  Initial CC: Found down at home  Hospital Course: Mr. Krzyzanowski is an 89 yo male with PMH chronic diastolic CHF, complete AV block s/p pacemaker, CKD 3, diabetes, HTN, HLD who presented after being found slumped over in his chair at home and unresponsive. He was spoken to on the phone on the evening of 10/09/2023 and upon being found on 10/10/2023 by the cleaner he was noted to be unresponsive slumped over the chair. At baseline he is considered high functioning and ambulates independently and lives alone.  He was admitted for further workup of altered mentation.   In the ED the patient underwent a CT head that demonstrated no acute process. A CTA head and neck demonstrated no LVO. There was however evidence of a remote lacunar infarct of the anterior right corona radiata. There was concern for Seizure, Posterior Circulation CVA, and meningitis. The patient will need to be off of anticoagulation for at least 48 hours. LP is planned for 10/12/2023. The patient will be started on empiric antibiotic coverage for meningitis.    MRI was performed and demonstrated no acute intracranial abnormality. There was evidence for chronic small vessel disease. There was also generalized appearing brain volume loss. EEG unremarkable. Neurology has evaluated the patient. Plan is for LP on the afternoon of 10/12/2023.    On 10/13/2023 the patient again appears completely obtunded. This may be due to ativan  given for MRI on 10/12/2023, but in any case the patient is not as alert as he was on 10/12/2023.    MRI brain has ruled out CVA.   LP performed on 10/13/2023 has ruled out meningitis. Have added on VDRL of CSF onto studies for the CSF. Antibiotics for meningitis stopped as per neurology. However, steroids continued in case there is an autoimmune component to the patient's illness.  Steroid was  discontinued on 10/14/2023.   DDx continues to include autoimmune diseases and tertiary syphillis. HIV testing has also been ordered.   10/14/2023 the patient is much improved according to the family at bedside. The patient has cracked a joke with the phlebotomist, eaten some breakfast, and been able to open his phone with his password. He has spoken. At the time of my visit, the patient is certainly more alert. He is non-verbal with me, but does track and attend to me.   Will continue IVIG and steroids. Consult PT/OT.   10/15/2023 Patient still appears improved, but not as interactive as 10/14/2023. Family states that he is just sleepy after eating breakfast.  As of 10/18/2023 also not speaking much or making sense.  Intermittently will follow some commands.    Assessment and Plan:    Acute encephalopathy - MRI neg for stroke. EEG no seizure like activity. LP no meningitis. Neurology thinks possible autoimmune encephalitis and completes 5 days IVIG on 10/18/23 but hasn't made much difference per family  - MRI w/wo contrast on 6/3 negative for acute findings also (remote infarcts) - B12 low/normal on 6/1 but no replacement; will replete now - folate low at 4.5, so will also start repletion  - EEG testing being repeated 6/5; follow up results  - Unfortunately not much improvement.  Mostly babbles throughout the day and says yes/no inappropriately; cannot get him to follow commands. If no major improvement after IVIG the other theory might have to be some level of  dementia that has set in? - repeat NH3 in am  Essential hypertension Bp mild/mod elevation - started amlodipine (home losart and coreg  are on hold)   Elevated serum osm gap Etiology unclear. Not acidotic. Family thinks toxic alcohol ingestion unlikely. Discussed with dr. Arno Bibles of hematology, would be abnormal for paraproteinemia to present with purely encephalopathy, he advises following labs ordered. - IgG normal/elevated; no free kappa  chains; no M-spike   Elevated troponin Appears to be demand - Echo unrmarkable   Acute urinary retention - resolved -Foley discontinued 6/2   Hyperlipidemia with target LDL less than 70 Not able to tolerate PO statin at this time   Insulin -requiring or dependent type II diabetes mellitus (HCC) Glucose improved Continue basal/bolus/ssi   Paroxysmal atrial fibrillation (HCC) - Continue Eliquis  and Lopressor   Sinus node dysfunction (HCC) - s/p pacemaker   Prolonged QT interval - will monitor on tele avoid QT prolonging medications, rehydrate correct electrolytes  Interval History:  Still no improvement.  Continues to say yes/no inappropriately and occasionally says "shit" when eliciting painful stimuli or doing something he does not want to do.  Seems to be his way of saying "no". Does not follow commands.  Every now and then with open-ended questions he will hesitate as if he is trying to think of a response but cannot get it out.   Old records reviewed in assessment of this patient  Antimicrobials:  DVT prophylaxis:  apixaban  (ELIQUIS ) tablet 2.5 mg Start: 10/15/23 1430 SCDs Start: 10/15/23 1335 SCD's Start: 10/10/23 2338 apixaban  (ELIQUIS ) tablet 2.5 mg   Code Status:   Code Status: Limited: Do not attempt resuscitation (DNR) -DNR-LIMITED -Do Not Intubate/DNI   Mobility Assessment (Last 72 Hours)     Mobility Assessment     Row Name 10/20/23 1424 10/20/23 1306 10/20/23 1200 10/20/23 0900 10/20/23 0545   Does patient have an order for bedrest or is patient medically unstable -- -- No - Continue assessment No - Continue assessment No - Continue assessment   What is the highest level of mobility based on the progressive mobility assessment? Level 1 (Bedfast) - Unable to balance while sitting on edge of bed Level 1 (Bedfast) - Unable to balance while sitting on edge of bed Level 1 (Bedfast) - Unable to balance while sitting on edge of bed Level 1 (Bedfast) - Unable to  balance while sitting on edge of bed Level 1 (Bedfast) - Unable to balance while sitting on edge of bed   Is the above level different from baseline mobility prior to current illness? -- -- Yes - Recommend PT order Yes - Recommend PT order Yes - Recommend PT order    Row Name 10/20/23 0400 10/19/23 2145 10/19/23 2000 10/19/23 0745 10/18/23 2000   Does patient have an order for bedrest or is patient medically unstable No - Continue assessment No - Continue assessment No - Continue assessment No - Continue assessment No - Continue assessment   What is the highest level of mobility based on the progressive mobility assessment? Level 1 (Bedfast) - Unable to balance while sitting on edge of bed Level 1 (Bedfast) - Unable to balance while sitting on edge of bed Level 1 (Bedfast) - Unable to balance while sitting on edge of bed Level 1 (Bedfast) - Unable to balance while sitting on edge of bed Level 1 (Bedfast) - Unable to balance while sitting on edge of bed   Is the above level different from baseline mobility prior to current illness? Yes -  Recommend PT order Yes - Recommend PT order Yes - Recommend PT order Yes - Recommend PT order Yes - Recommend PT order    Row Name 10/18/23 1341 10/18/23 1331 10/18/23 0755 10/18/23 0545 10/18/23 0400   Does patient have an order for bedrest or is patient medically unstable -- -- No - Continue assessment No - Continue assessment No - Continue assessment   What is the highest level of mobility based on the progressive mobility assessment? Level 1 (Bedfast) - Unable to balance while sitting on edge of bed Level 1 (Bedfast) - Unable to balance while sitting on edge of bed Level 1 (Bedfast) - Unable to balance while sitting on edge of bed Level 1 (Bedfast) - Unable to balance while sitting on edge of bed Level 1 (Bedfast) - Unable to balance while sitting on edge of bed   Is the above level different from baseline mobility prior to current illness? -- -- Yes - Recommend PT  order Yes - Recommend PT order Yes - Recommend PT order    Row Name 10/18/23 0010 10/17/23 2125 10/17/23 2000 10/17/23 1944     Does patient have an order for bedrest or is patient medically unstable No - Continue assessment No - Continue assessment No - Continue assessment No - Continue assessment    What is the highest level of mobility based on the progressive mobility assessment? Level 1 (Bedfast) - Unable to balance while sitting on edge of bed Level 1 (Bedfast) - Unable to balance while sitting on edge of bed Level 1 (Bedfast) - Unable to balance while sitting on edge of bed Level 1 (Bedfast) - Unable to balance while sitting on edge of bed    Is the above level different from baseline mobility prior to current illness? Yes - Recommend PT order Yes - Recommend PT order Yes - Recommend PT order Yes - Recommend PT order             Barriers to discharge:  Disposition Plan: SNF HH orders placed: TBD Status is: Inpt  Objective: Blood pressure 132/82, pulse 73, temperature 98.3 F (36.8 C), temperature source Oral, resp. rate 18, weight 91 kg, SpO2 98%.  Examination:  Physical Exam Constitutional:      Comments: Awake and resting in bed in no distress but not fully able to interact  HENT:     Head: Normocephalic and atraumatic.     Mouth/Throat:     Mouth: Mucous membranes are moist.  Eyes:     Extraocular Movements: Extraocular movements intact.  Cardiovascular:     Rate and Rhythm: Normal rate and regular rhythm.  Pulmonary:     Effort: Pulmonary effort is normal. No respiratory distress.     Breath sounds: Normal breath sounds. No wheezing.  Abdominal:     General: Bowel sounds are normal. There is no distension.     Palpations: Abdomen is soft.     Tenderness: There is no abdominal tenderness.  Musculoskeletal:        General: Normal range of motion.     Cervical back: Normal range of motion and neck supple.  Skin:    General: Skin is warm and dry.  Neurological:      Comments: Mostly answers yes to questions but inappropriately.  Withdraws to pain in all 4 extremities appropriately      Consultants:  Neurology  Procedures:    Data Reviewed: Results for orders placed or performed during the hospital encounter of 10/10/23 (from the past  24 hours)  Glucose, capillary     Status: Abnormal   Collection Time: 10/19/23  4:04 PM  Result Value Ref Range   Glucose-Capillary 208 (H) 70 - 99 mg/dL   Comment 1 Notify RN   Glucose, capillary     Status: Abnormal   Collection Time: 10/19/23  9:46 PM  Result Value Ref Range   Glucose-Capillary 134 (H) 70 - 99 mg/dL   Comment 1 Notify RN   Basic metabolic panel     Status: Abnormal   Collection Time: 10/20/23  3:40 AM  Result Value Ref Range   Sodium 135 135 - 145 mmol/L   Potassium 4.1 3.5 - 5.1 mmol/L   Chloride 104 98 - 111 mmol/L   CO2 22 22 - 32 mmol/L   Glucose, Bld 113 (H) 70 - 99 mg/dL   BUN 34 (H) 8 - 23 mg/dL   Creatinine, Ser 5.36 (H) 0.61 - 1.24 mg/dL   Calcium 8.1 (L) 8.9 - 10.3 mg/dL   GFR, Estimated 40 (L) >60 mL/min   Anion gap 9 5 - 15  Magnesium     Status: None   Collection Time: 10/20/23  3:40 AM  Result Value Ref Range   Magnesium 1.9 1.7 - 2.4 mg/dL  Phosphorus     Status: None   Collection Time: 10/20/23  3:40 AM  Result Value Ref Range   Phosphorus 3.4 2.5 - 4.6 mg/dL  CBC with Differential/Platelet     Status: Abnormal   Collection Time: 10/20/23  3:40 AM  Result Value Ref Range   WBC 5.2 4.0 - 10.5 K/uL   RBC 4.42 4.22 - 5.81 MIL/uL   Hemoglobin 13.6 13.0 - 17.0 g/dL   HCT 64.4 (L) 03.4 - 74.2 %   MCV 88.0 80.0 - 100.0 fL   MCH 30.8 26.0 - 34.0 pg   MCHC 35.0 30.0 - 36.0 g/dL   RDW 59.5 63.8 - 75.6 %   Platelets 119 (L) 150 - 400 K/uL   nRBC 0.0 0.0 - 0.2 %   Neutrophils Relative % 56 %   Neutro Abs 2.9 1.7 - 7.7 K/uL   Lymphocytes Relative 21 %   Lymphs Abs 1.1 0.7 - 4.0 K/uL   Monocytes Relative 19 %   Monocytes Absolute 1.0 0.1 - 1.0 K/uL   Eosinophils  Relative 3 %   Eosinophils Absolute 0.2 0.0 - 0.5 K/uL   Basophils Relative 0 %   Basophils Absolute 0.0 0.0 - 0.1 K/uL   Immature Granulocytes 1 %   Abs Immature Granulocytes 0.06 0.00 - 0.07 K/uL  Glucose, capillary     Status: None   Collection Time: 10/20/23  6:14 AM  Result Value Ref Range   Glucose-Capillary 96 70 - 99 mg/dL   Comment 1 QC Due   Glucose, capillary     Status: None   Collection Time: 10/20/23  6:50 AM  Result Value Ref Range   Glucose-Capillary 91 70 - 99 mg/dL  Glucose, capillary     Status: Abnormal   Collection Time: 10/20/23 11:42 AM  Result Value Ref Range   Glucose-Capillary 226 (H) 70 - 99 mg/dL   Comment 1 Notify RN     I have reviewed pertinent nursing notes, vitals, labs, and images as necessary. I have ordered labwork to follow up on as indicated.  I have reviewed the last notes from staff over past 24 hours. I have discussed patient's care plan and test results with nursing staff, CM/SW, and other  staff as appropriate.  Time spent: Greater than 50% of the 55 minute visit was spent in counseling/coordination of care for the patient as laid out in the A&P.   LOS: 10 days   Faith Homes, MD [facility] 10/20/2023, 3:07 PM

## 2023-10-20 NOTE — Progress Notes (Signed)
 vLTM maintenance  All impedances below 10kohms.  No skin breakdown noted at FP1  FP2  F4  F3

## 2023-10-20 NOTE — Progress Notes (Signed)
 Occupational Therapy Treatment Patient Details Name: Connor Dixon MRN: [id number] DOB: 1934/09/29 Today's Date: 10/20/2023   History of present illness Pt is 89 year old presented to [facility] on  10/10/23 after found at home unresponsive. Pt with dense encephalopathy with flexor positioning of UE's. LP performed on 10/13/2023 has ruled out meningitis. CT and MRI on 5/29 were negative for acute intercranial abnormality.  PMH - afib on Eliquis , pacer, HTN, CKD, DM, obesity   OT comments  Pt with very limited command following, not initiation or engagement in functional tasks and needing max A for sitting balance, able to progress to min A for sitting balance when not pushing to the R. Pt mostly nonverbal other than occasional "shit" "yes" or "shevidently." Downgraded pt goals as he has not progressed since initial eval needing total A in majority of ADLs given his limited cog ability. OT to continue following pt acutely to address listed deficits and help transition to next level of care. Patient will benefit from continued inpatient follow up therapy, <3 hours/day       If plan is discharge home, recommend the following:  Two people to help with walking and/or transfers;Two people to help with bathing/dressing/bathroom;Direct supervision/assist for medications management;Direct supervision/assist for financial management;Supervision due to cognitive status;Assist for transportation;Assistance with cooking/housework;Help with stairs or ramp for entrance   Equipment Recommendations   (defer to next venue of care)    Recommendations for Other Services      Precautions / Restrictions Precautions Precautions: Fall Recall of Precautions/Restrictions: Impaired Precaution/Restrictions Comments: EEG Restrictions Weight Bearing Restrictions Per Provider Order: No       Mobility Bed Mobility Overal bed mobility: Needs Assistance Bed Mobility: Supine to Sit, Sit to Supine     Supine to sit: Max  assist, +2 for physical assistance Sit to supine: Max assist, +2 for physical assistance   General bed mobility comments: MaxAx2 to move LE's off EOB with pt slightly assisting with raising trunk. Use of bed pad to shift hips forward. Heavy Max to Total A initially in sitting to keep trunk in midline.    Transfers Overall transfer level: Needs assistance Equipment used: None Transfers: Bed to chair/wheelchair/BSC     Squat pivot transfers: Total assist, +2 physical assistance, Max assist       General transfer comment: +2 Max to total A for squat pivot transfers from EOB to recliner. Pt was able to assist leaning forward with tactile cues for initiation in order to facilitate trunk flexion. Once in sitting pt was able to correct posture and attempt to assist scooting posteriorly in the chair with minimal to no progress scooting into chair. Pt propped up with pillows at the R shoulder/arm and legs in order to prevent pt from scooting down in the recliner. Family in room and alarm pad under pt for safety     Balance Overall balance assessment: Needs assistance Sitting-balance support: Feet supported Sitting balance-Leahy Scale: Poor Sitting balance - Comments: TotalA to MaxA for majority of session with R lateral lean. Improved balance when sitting in recliner. Postural control: Right lateral lean, Posterior lean Standing balance support: Bilateral upper extremity supported, No upper extremity supported, Reliant on assistive device for balance Standing balance-Leahy Scale: Zero Standing balance comment: Total A pt was not fully in standing.                           ADL either performed or assessed with clinical judgement  ADL                                         General ADL Comments: Pt overall total A for ADLs, no initiation for ADLs and pt not following many commands    Extremity/Trunk Assessment Upper Extremity Assessment LUE Deficits / Details:  Painful shoulder per daughter. Pt guarding with minimal use of LUE, parital use to support LUE on rail for balance.            Vision       Perception     Praxis Praxis Praxis Impairment Details: Initiation;Ideation;Motor planning;Organization   Communication Communication Communication: Impaired Factors Affecting Communication: Difficulty expressing self   Cognition Arousal: Alert Behavior During Therapy: Flat affect, Impulsive     Orientation impairments:  (only able to state "yes" "shit" or occasionally "alright") Awareness: Intellectual awareness impaired, Online awareness impaired Memory impairment (select all impairments):  (Non-verbal except for occassional "yeah" or "shit".) Attention impairment (select first level of impairment): Sustained attention, Focused attention Executive functioning impairment (select all impairments): Initiation, Sequencing, Reasoning, Organization, Problem solving                   Following commands: Impaired Following commands impaired: Follows one step commands inconsistently, Follows one step commands with increased time      Cueing   Cueing Techniques: Verbal cues, Gestural cues, Tactile cues, Visual cues  Exercises      Shoulder Instructions       General Comments Family at bedside during session. No signs/symptoms of distress during session    Pertinent Vitals/ Pain       Pain Assessment Pain Assessment: Faces Faces Pain Scale: Hurts a little bit Pain Location: penis with donning condom catheter- pt continously saying the S word, but he also did this intermittently throughout session. Pain Descriptors / Indicators: Guarding, Grimacing Pain Intervention(s): Monitored during session  Home Living                                          Prior Functioning/Environment              Frequency  Min 2X/week        Progress Toward Goals  OT Goals(current goals can now be found in the care  plan section)  Progress towards OT goals: Not progressing toward goals - comment;Goals drowngraded-see care plan  Acute Rehab OT Goals Patient Stated Goal: Per family, for pt to have opportunities to move and stand ad lib OT Goal Formulation: With family Time For Goal Achievement: 10/27/23 Potential to Achieve Goals: Fair ADL Goals Pt Will Perform Upper Body Dressing: sitting;with min assist Pt Will Perform Lower Body Dressing: with mod assist;sit to/from stand  Plan      Co-evaluation    PT/OT/SLP Co-Evaluation/Treatment: Yes Reason for Co-Treatment: Complexity of the patient's impairments (multi-system involvement);Necessary to address cognition/behavior during functional activity PT goals addressed during session: Mobility/safety with mobility;Balance OT goals addressed during session: ADL's and self-care;Strengthening/ROM      AM-PAC OT "6 Clicks" Daily Activity     Outcome Measure   Help from another person eating meals?: A Lot Help from another person taking care of personal grooming?: Total Help from another person toileting, which includes using toliet, bedpan, or urinal?: Total Help from  another person bathing (including washing, rinsing, drying)?: Total Help from another person to put on and taking off regular upper body clothing?: Total Help from another person to put on and taking off regular lower body clothing?: Total 6 Click Score: 7    End of Session Equipment Utilized During Treatment: Gait belt  OT Visit Diagnosis: Other abnormalities of gait and mobility (R26.89);Unsteadiness on feet (R26.81);Other symptoms and signs involving cognitive function;Pain;Cognitive communication deficit (R41.841);Apraxia (R48.2) Pain - Right/Left: Left Pain - part of body: Shoulder   Activity Tolerance Patient tolerated treatment well   Patient Left in chair;with call bell/phone within reach;with chair alarm set;with family/visitor present   Nurse Communication Mobility  status        Time: 1610-9604 OT Time Calculation (min): 29 min  Charges: OT General Charges $OT Visit: 1 Visit OT Treatments $Therapeutic Activity: 8-22 mins  10/20/2023  AB, OTR/L  Acute Rehabilitation Services  Office: [phone]   Jorene New 10/20/2023, 2:25 PM

## 2023-10-20 NOTE — Procedures (Addendum)
 Patient Name: Connor Dixon  MRN: [id number]  Epilepsy Attending: Arleene Lack  Referring Physician/Provider: Laymond Priestly, NP  Duration: 10/19/2023 1036 to 10/20/2023 1036  Patient history: 89 y.o. male who presented with severe encephalopathy which is currently of unclear etiology. EEG to evaluate for seizure  Level of alertness: Awake  AEDs during EEG study: None  Technical aspects: This EEG study was done with scalp electrodes positioned according to the 10-20 International system of electrode placement. Electrical activity was reviewed with band pass filter of 1-70Hz , sensitivity of 7 uV/mm, display speed of 38mm/sec with a 60Hz  notched filter applied as appropriate. EEG data were recorded continuously and digitally stored.  Video monitoring was available and reviewed as appropriate.  Description: EEG showed continuous generalized 3 to 6 Hz theta-delta slowing. Hyperventilation and photic stimulation were not performed.     ABNORMALITY - Continuous slow, generalized  IMPRESSION: This study is suggestive of moderate diffuse encephalopathy. No seizures or epileptiform discharges were seen throughout the recording.  Ashlee Bewley O Makenzye Troutman

## 2023-10-20 NOTE — Plan of Care (Signed)
  Problem: Nutritional: Goal: Maintenance of adequate nutrition will improve Outcome: Progressing Goal: Progress toward achieving an optimal weight will improve Outcome: Progressing   Problem: Skin Integrity: Goal: Risk for impaired skin integrity will decrease Outcome: Progressing   Problem: Tissue Perfusion: Goal: Adequacy of tissue perfusion will improve Outcome: Progressing   Problem: Clinical Measurements: Goal: Ability to maintain clinical measurements within normal limits will improve Outcome: Progressing Goal: Will remain free from infection Outcome: Progressing Goal: Diagnostic test results will improve Outcome: Progressing Goal: Respiratory complications will improve Outcome: Progressing Goal: Cardiovascular complication will be avoided Outcome: Progressing   Problem: Safety: Goal: Ability to remain free from injury will improve Outcome: Progressing   Problem: Education: Goal: Knowledge of General Education information will improve Description: Including pain rating scale, medication(s)/side effects and non-pharmacologic comfort measures Outcome: Progressing

## 2023-10-20 NOTE — TOC Progression Note (Signed)
 Transition of Care [facility]) - Progression Note    Patient Details  Name: Connor Dixon MRN: [id number] Date of Birth: [birth year]  Transition of Care [facility]) CM/SW Contact  Tandy Fam, [place] Phone Number: 10/20/2023, 2:50 PM  Clinical Narrative:   CSW received update from [facility] and [facility] that they will wait to review referral next week if patient is closer to medically stable. CSW met with son, Connor Dixon, at bedside to provide update and answer questions. Connor Dixon appreciative of update. CSW to follow.    Expected Discharge Plan: Skilled Nursing Facility Barriers to Discharge: Insurance Authorization  Expected Discharge Plan and Services                                               Social Determinants of Health (SDOH) Interventions SDOH Screenings   Food Insecurity: Patient Unable To Answer (10/13/2023)  Housing: Patient Unable To Answer (10/13/2023)  Transportation Needs: Patient Unable To Answer (10/13/2023)  Utilities: Patient Unable To Answer (10/13/2023)  Alcohol Screen: Low Risk  (04/06/2023)  Depression (PHQ2-9): Low Risk  (09/06/2023)  Financial Resource Strain: Low Risk  (04/06/2023)  Physical Activity: Inactive (04/06/2023)  Social Connections: Unknown (10/13/2023)  Stress: No Stress Concern Present (04/06/2023)  Tobacco Use: Low Risk  (10/10/2023)  Health Literacy: Adequate Health Literacy (04/06/2023)    Readmission Risk Interventions     No data to display

## 2023-10-20 NOTE — Progress Notes (Signed)
 Physical Therapy Treatment Patient Details Name: Connor Dixon MRN: [id number] DOB: December 13, 1934 Today's Date: 10/20/2023   History of Present Illness Pt is 89 year old presented [facility] on  10/10/23 after found at home unresponsive. Pt with dense encephalopathy with flexor positioning of UE's. LP performed on 10/13/2023 has ruled out meningitis. CT and MRI on 5/29 were negative for acute intercranial abnormality.  PMH - afib on Eliquis , pacer, HTN, CKD, DM, obesity    PT Comments  Pt is slowly progressing towards goals. Currently pt is Max A +2 for bed mobility and squat pivot transfers. Pt was able to improve posture when up in recliner correcting toward the L and initiating forward lean and scooting posteriorly in chair though pt did not make good progress. Due to pt current functional status, home set up and available assistance at home recommending skilled physical therapy services < 3 hours/day in order to address strength, balance and functional mobility to decrease risk for falls, injury, immobility, skin break down and re-hospitalization.     If plan is discharge home, recommend the following: Two people to help with walking and/or transfers;Assistance with cooking/housework;Supervision due to cognitive status;Assist for transportation;Help with stairs or ramp for entrance   Can travel by private vehicle     No  Equipment Recommendations  BSC/3in1;Wheelchair cushion (measurements PT);Wheelchair (measurements PT);Hoyer lift;Hospital bed       Precautions / Restrictions Precautions Precautions: Fall Recall of Precautions/Restrictions: Impaired Restrictions Weight Bearing Restrictions Per Provider Order: No     Mobility  Bed Mobility Overal bed mobility: Needs Assistance Bed Mobility: Supine to Sit, Sit to Supine     Supine to sit: Max assist, +2 for physical assistance Sit to supine: Max assist, +2 for physical assistance   General bed mobility comments: MaxAx2 to move LE's off  EOB with pt slightly assisting with raising trunk. Use of bed pad to shift hips forward. Heavy Max to Total A initially in sitting to keep trunk in midline.    Transfers Overall transfer level: Needs assistance Equipment used: None Transfers: Bed to chair/wheelchair/BSC       Squat pivot transfers: Total assist, +2 physical assistance, Max assist     General transfer comment: +2 Max to total A for squat pivot transfers from EOB to recliner. Pt was able to assist leaning forward with tactile cues for initiation in order to facilitate trunk flexion. Once in sitting pt was able to correct posture and attempt to assist scooting posteriorly in the chair with minimal to no progress scooting into chair. Pt propped up with pillows at the R shoulder/arm and legs in order to prevent pt from scooting down in the recliner. Family in room and alarm pad under pt for safety    Ambulation/Gait       General Gait Details: unable this date      Balance Overall balance assessment: Needs assistance Sitting-balance support: Feet supported Sitting balance-Leahy Scale: Poor Sitting balance - Comments: TotalA to MaxA for majority of session with R lateral lean. Improved balance when sitting in recliner. Postural control: Right lateral lean, Posterior lean Standing balance support: Bilateral upper extremity supported, No upper extremity supported, Reliant on assistive device for balance Standing balance-Leahy Scale: Zero Standing balance comment: Total A pt was not fully in standing.      Communication Communication Communication: Impaired Factors Affecting Communication: Difficulty expressing self  Cognition Arousal: Alert Behavior During Therapy: Flat affect, Impulsive   PT - Cognitive impairments: Initiation, Attention, Problem solving, Awareness,  Safety/Judgement, Difficult to assess Difficult to assess due to: Impaired communication       PT - Cognition Comments: Pt states "yes" and  "alright" to all questions. Following commands: Impaired Following commands impaired: Follows one step commands inconsistently, Follows one step commands with increased time    Cueing Cueing Techniques: Verbal cues, Gestural cues, Tactile cues, Visual cues     General Comments General comments (skin integrity, edema, etc.): Family at bedside during session. No signs/symptoms of distress during session      Pertinent Vitals/Pain Pain Assessment Pain Assessment: Faces Faces Pain Scale: Hurts a little bit Breathing: normal Negative Vocalization: occasional moan/groan, low speech, negative/disapproving quality Facial Expression: facial grimacing Body Language: relaxed Consolability: no need to console PAINAD Score: 3 Pain Location: penis with donning condom catheter Pain Descriptors / Indicators: Guarding, Grimacing Pain Intervention(s): Monitored during session     PT Goals (current goals can now be found in the care plan section) Acute Rehab PT Goals Patient Stated Goal: improve pt mobility PT Goal Formulation: With family Time For Goal Achievement: 10/27/23 Potential to Achieve Goals: Good Progress towards PT goals: Progressing toward goals    Frequency    Min 2X/week      PT Plan  Continue with current POC     Co-evaluation PT/OT/SLP Co-Evaluation/Treatment: Yes Reason for Co-Treatment: Complexity of the patient's impairments (multi-system involvement);Necessary to address cognition/behavior during functional activity PT goals addressed during session: Mobility/safety with mobility;Balance OT goals addressed during session: ADL's and self-care;Strengthening/ROM      AM-PAC PT "6 Clicks" Mobility   Outcome Measure  Help needed turning from your back to your side while in a flat bed without using bedrails?: A Lot Help needed moving from lying on your back to sitting on the side of a flat bed without using bedrails?: Total Help needed moving to and from a bed to  a chair (including a wheelchair)?: Total Help needed standing up from a chair using your arms (e.g., wheelchair or bedside chair)?: Total Help needed to walk in hospital room?: Total Help needed climbing 3-5 steps with a railing? : Total 6 Click Score: 7    End of Session Equipment Utilized During Treatment: Gait belt Activity Tolerance: Patient tolerated treatment well Patient left: in chair;with call bell/phone within reach;with chair alarm set Nurse Communication: Mobility status PT Visit Diagnosis: Other abnormalities of gait and mobility (R26.89);Other symptoms and signs involving the nervous system (R29.898);Muscle weakness (generalized) (M62.81)     Time: 9629-5284 PT Time Calculation (min) (ACUTE ONLY): 30 min  Charges:    $Therapeutic Activity: 8-22 mins PT General Charges $$ ACUTE PT VISIT: 1 Visit                    Sloan Duncans, DPT, CLT  Acute Rehabilitation Services Office: [phone] (Secure chat preferred)    Jenice Mitts 10/20/2023, 1:07 PM

## 2023-10-21 ENCOUNTER — Inpatient Hospital Stay (HOSPITAL_COMMUNITY)

## 2023-10-21 DIAGNOSIS — G9340 Encephalopathy, unspecified: Secondary | ICD-10-CM | POA: Diagnosis not present

## 2023-10-21 DIAGNOSIS — R569 Unspecified convulsions: Secondary | ICD-10-CM | POA: Diagnosis not present

## 2023-10-21 LAB — CBC WITH DIFFERENTIAL/PLATELET
Abs Immature Granulocytes: 0.06 10*3/uL (ref 0.00–0.07)
Basophils Absolute: 0 10*3/uL (ref 0.0–0.1)
Basophils Relative: 0 %
Eosinophils Absolute: 0.2 10*3/uL (ref 0.0–0.5)
Eosinophils Relative: 3 %
HCT: 39.9 % (ref 39.0–52.0)
Hemoglobin: 13.7 g/dL (ref 13.0–17.0)
Immature Granulocytes: 1 %
Lymphocytes Relative: 21 %
Lymphs Abs: 0.9 10*3/uL (ref 0.7–4.0)
MCH: 30.6 pg (ref 26.0–34.0)
MCHC: 34.3 g/dL (ref 30.0–36.0)
MCV: 89.1 fL (ref 80.0–100.0)
Monocytes Absolute: 0.8 10*3/uL (ref 0.1–1.0)
Monocytes Relative: 17 %
Neutro Abs: 2.6 10*3/uL (ref 1.7–7.7)
Neutrophils Relative %: 58 %
Platelets: 118 10*3/uL — ABNORMAL LOW (ref 150–400)
RBC: 4.48 MIL/uL (ref 4.22–5.81)
RDW: 13.9 % (ref 11.5–15.5)
WBC: 4.6 10*3/uL (ref 4.0–10.5)
nRBC: 0 % (ref 0.0–0.2)

## 2023-10-21 LAB — GLUCOSE, CAPILLARY
Glucose-Capillary: 88 mg/dL (ref 70–99)
Glucose-Capillary: 98 mg/dL (ref 70–99)
Glucose-Capillary: 183 mg/dL — ABNORMAL HIGH (ref 70–99)
Glucose-Capillary: 194 mg/dL — ABNORMAL HIGH (ref 70–99)

## 2023-10-21 LAB — MAGNESIUM: Magnesium: 2 mg/dL (ref 1.7–2.4)

## 2023-10-21 LAB — AMMONIA: Ammonia: 22 umol/L (ref 9–35)

## 2023-10-21 LAB — PHOSPHORUS: Phosphorus: 2.2 mg/dL — ABNORMAL LOW (ref 2.5–4.6)

## 2023-10-21 MED ORDER — K PHOS MONO-SOD PHOS DI & MONO 155-852-130 MG PO TABS
500.0000 mg | ORAL_TABLET | Freq: Two times a day (BID) | ORAL | Status: AC
  Administered 2023-10-21 (×2): 500 mg via ORAL
  Filled 2023-10-21 (×2): qty 2

## 2023-10-21 MED ORDER — FOLIC ACID 1 MG PO TABS
1.0000 mg | ORAL_TABLET | Freq: Every day | ORAL | Status: DC
  Administered 2023-10-22 – 2023-11-08 (×18): 1 mg via ORAL
  Filled 2023-10-21 (×18): qty 1

## 2023-10-21 NOTE — Progress Notes (Signed)
 Progress Note    Connor Dixon   ZOX:[id number]  DOB: [birth year]  DOA: 10/10/2023     11 PCP: Connor Knuckles, MD  Initial CC: Found down at home  Hospital Course: Connor Dixon is an 89 yo male with PMH chronic diastolic CHF, complete AV block s/p pacemaker, CKD 3, diabetes, HTN, HLD who presented after being found slumped over in his chair at home and unresponsive. He was spoken to on the phone on the evening of 10/09/2023 and upon being found on 10/10/2023 by the cleaner he was noted to be unresponsive slumped over the chair. At baseline he is considered high functioning and ambulates independently and lives alone.  He was admitted for further workup of altered mentation.   In the ED the patient underwent a CT head that demonstrated no acute process. A CTA head and neck demonstrated no LVO. There was however evidence of a remote lacunar infarct of the anterior right corona radiata. There was concern for Seizure, Posterior Circulation CVA, and meningitis. The patient will need to be off of anticoagulation for at least 48 hours. LP is planned for 10/12/2023. The patient will be started on empiric antibiotic coverage for meningitis.    MRI was performed and demonstrated no acute intracranial abnormality. There was evidence for chronic small vessel disease. There was also generalized appearing brain volume loss. EEG unremarkable. Neurology has evaluated the patient. Plan is for LP on the afternoon of 10/12/2023.    On 10/13/2023 the patient again appears completely obtunded. This may be due to ativan  given for MRI on 10/12/2023, but in any case the patient is not as alert as he was on 10/12/2023.    MRI brain has ruled out CVA.   LP performed on 10/13/2023 has ruled out meningitis. Have added on VDRL of CSF onto studies for the CSF. Antibiotics for meningitis stopped as per neurology. However, steroids continued in case there is an autoimmune component to the patient's illness.  Steroid was  discontinued on 10/14/2023.   DDx continues to include autoimmune diseases and tertiary syphillis. HIV testing has also been ordered.   10/14/2023 the patient is much improved according to the family at bedside. The patient has cracked a joke with the phlebotomist, eaten some breakfast, and been able to open his phone with his password. He has spoken. At the time of my visit, the patient is certainly more alert. He is non-verbal with me, but does track and attend to me.   Will continue IVIG and steroids. Consult PT/OT.   10/15/2023 Patient still appears improved, but not as interactive as 10/14/2023. Family states that he is just sleepy after eating breakfast.  As of 10/18/2023 also not speaking much or making sense.  Intermittently will follow some commands.    Assessment and Plan:    Acute encephalopathy - MRI neg for stroke. EEG no seizure like activity. LP no meningitis. Neurology thinks possible autoimmune encephalitis and completed 5 days IVIG on 10/18/23 but hasn't made much improvement - MRI w/wo contrast on 6/3 negative for acute findings also (remote infarcts) - B12 low/normal on 6/1 but no replacement; will replete now - folate low at 4.5, so will also start repletion  - EEG testing being repeated 6/5; still unremarkable - Unfortunately not much improvement.  Mostly babbles throughout the day and says yes/no inappropriately; cannot get him to follow commands. If no major improvement after IVIG the other theory might have to be some level of dementia that was already present  and worsened with the acute illness that led to hospitalization  Suspected cognitive impairment, POA - patient is described as having some level of decreasing cognition over at least weeks to months prior to admission; more forgetfulness with directions, cooking/prepping meals, finances, and not driving lawn mower well - quite possible as noted above that underlying cognitive impairment became acutely worsened in the  setting of this acute illness - might need more time to acclimate and return to close to baseline but only time will tell at this rate  Essential hypertension Bp mild/mod elevation - started amlodipine  (home losart and coreg  are on hold)   Elevated serum osm gap Etiology unclear. Not acidotic. Family thinks toxic alcohol ingestion unlikely. Discussed with Connor Dixon, would be abnormal for paraproteinemia to present with purely encephalopathy, he advises following labs ordered. - IgG normal/elevated; no free kappa chains; no M-spike   Elevated troponin Appears to be demand - Echo unrmarkable   Acute urinary retention - resolved -Foley discontinued 6/2   Hyperlipidemia with target LDL less than 70 Not able to tolerate PO statin at this time   Insulin -requiring or dependent type II diabetes mellitus (HCC) Glucose improved Continue basal/bolus/ssi   Paroxysmal atrial fibrillation (HCC) - Continue Eliquis  and Lopressor    Sinus node dysfunction (HCC) - s/p pacemaker   Prolonged QT interval - will monitor on tele avoid QT prolonging medications, rehydrate correct electrolytes  Interval History:  Still no improvement.  Continues to say yes/no inappropriately and occasionally says "shit" when eliciting painful stimuli or doing something he does not want to do.  Seems to be his way of saying "no". Does not follow commands.     Old records reviewed in assessment of this patient  Antimicrobials:  DVT prophylaxis:  apixaban  (ELIQUIS ) tablet 2.5 mg Start: 10/15/23 1430 SCDs Start: 10/15/23 1335 SCD's Start: 10/10/23 2338 apixaban  (ELIQUIS ) tablet 2.5 mg   Code Status:   Code Status: Limited: Do not attempt resuscitation (DNR) -DNR-LIMITED -Do Not Intubate/DNI   Mobility Assessment (Last 72 Hours)     Mobility Assessment     Row Name 10/21/23 0556 10/21/23 0445 10/21/23 0400 10/21/23 0000 10/20/23 2221   Does patient have an order for bedrest or is patient  medically unstable No - Continue assessment No - Continue assessment No - Continue assessment No - Continue assessment No - Continue assessment   What is the highest level of mobility based on the progressive mobility assessment? Level 1 (Bedfast) - Unable to balance while sitting on edge of bed Level 1 (Bedfast) - Unable to balance while sitting on edge of bed Level 1 (Bedfast) - Unable to balance while sitting on edge of bed Level 1 (Bedfast) - Unable to balance while sitting on edge of bed Level 1 (Bedfast) - Unable to balance while sitting on edge of bed   Is the above level different from baseline mobility prior to current illness? Yes - Recommend PT order Yes - Recommend PT order Yes - Recommend PT order Yes - Recommend PT order Yes - Recommend PT order    Row Name 10/20/23 2000 10/20/23 1600 10/20/23 1424 10/20/23 1306 10/20/23 1200   Does patient have an order for bedrest or is patient medically unstable No - Continue assessment No - Continue assessment -- -- No - Continue assessment   What is the highest level of mobility based on the progressive mobility assessment? Level 1 (Bedfast) - Unable to balance while sitting on edge of bed Level 1 (Bedfast) -  Unable to balance while sitting on edge of bed Level 1 (Bedfast) - Unable to balance while sitting on edge of bed Level 1 (Bedfast) - Unable to balance while sitting on edge of bed Level 1 (Bedfast) - Unable to balance while sitting on edge of bed   Is the above level different from baseline mobility prior to current illness? Yes - Recommend PT order Yes - Recommend PT order -- -- Yes - Recommend PT order    Row Name 10/20/23 0900 10/20/23 0545 10/20/23 0400 10/19/23 2145 10/19/23 2000   Does patient have an order for bedrest or is patient medically unstable No - Continue assessment No - Continue assessment No - Continue assessment No - Continue assessment No - Continue assessment   What is the highest level of mobility based on the progressive  mobility assessment? Level 1 (Bedfast) - Unable to balance while sitting on edge of bed Level 1 (Bedfast) - Unable to balance while sitting on edge of bed Level 1 (Bedfast) - Unable to balance while sitting on edge of bed Level 1 (Bedfast) - Unable to balance while sitting on edge of bed Level 1 (Bedfast) - Unable to balance while sitting on edge of bed   Is the above level different from baseline mobility prior to current illness? Yes - Recommend PT order Yes - Recommend PT order Yes - Recommend PT order Yes - Recommend PT order Yes - Recommend PT order    Row Name 10/19/23 0745 10/18/23 2000         Does patient have an order for bedrest or is patient medically unstable No - Continue assessment No - Continue assessment      What is the highest level of mobility based on the progressive mobility assessment? Level 1 (Bedfast) - Unable to balance while sitting on edge of bed Level 1 (Bedfast) - Unable to balance while sitting on edge of bed      Is the above level different from baseline mobility prior to current illness? Yes - Recommend PT order Yes - Recommend PT order               Barriers to discharge:  Disposition Plan: SNF HH orders placed: TBD Status is: Inpt  Objective: Blood pressure 124/76, pulse 68, temperature 98.1 F (36.7 C), temperature source Oral, resp. rate 20, weight 91 kg, SpO2 99%.  Examination:  Physical Exam Constitutional:      Comments: Awake and resting in bed in no distress but not fully able to interact  HENT:     Head: Normocephalic and atraumatic.     Mouth/Throat:     Mouth: Mucous membranes are moist.  Eyes:     Extraocular Movements: Extraocular movements intact.  Cardiovascular:     Rate and Rhythm: Normal rate and regular rhythm.  Pulmonary:     Effort: Pulmonary effort is normal. No respiratory distress.     Breath sounds: Normal breath sounds. No wheezing.  Abdominal:     General: Bowel sounds are normal. There is no distension.      Palpations: Abdomen is soft.     Tenderness: There is no abdominal tenderness.  Musculoskeletal:        General: Normal range of motion.     Cervical back: Normal range of motion and neck supple.  Skin:    General: Skin is warm and dry.  Neurological:     Comments: Mostly answers yes to questions but inappropriately.  Withdraws to pain in all 4  extremities appropriately      Consultants:  Neurology  Procedures:    Data Reviewed: Results for orders placed or performed during the hospital encounter of 10/10/23 (from the past 24 hours)  Glucose, capillary     Status: Abnormal   Collection Time: 10/20/23  4:06 PM  Result Value Ref Range   Glucose-Capillary 155 (H) 70 - 99 mg/dL   Comment 1 Notify RN   Glucose, capillary     Status: Abnormal   Collection Time: 10/20/23  9:40 PM  Result Value Ref Range   Glucose-Capillary 201 (H) 70 - 99 mg/dL   Comment 1 Notify RN   Magnesium     Status: None   Collection Time: 10/21/23  4:10 AM  Result Value Ref Range   Magnesium 2.0 1.7 - 2.4 mg/dL  Phosphorus     Status: Abnormal   Collection Time: 10/21/23  4:10 AM  Result Value Ref Range   Phosphorus 2.2 (L) 2.5 - 4.6 mg/dL  CBC with Differential/Platelet     Status: Abnormal   Collection Time: 10/21/23  4:10 AM  Result Value Ref Range   WBC 4.6 4.0 - 10.5 K/uL   RBC 4.48 4.22 - 5.81 MIL/uL   Hemoglobin 13.7 13.0 - 17.0 g/dL   HCT 41.3 24.4 - 01.0 %   MCV 89.1 80.0 - 100.0 fL   MCH 30.6 26.0 - 34.0 pg   MCHC 34.3 30.0 - 36.0 g/dL   RDW 27.2 53.6 - 64.4 %   Platelets 118 (L) 150 - 400 K/uL   nRBC 0.0 0.0 - 0.2 %   Neutrophils Relative % 58 %   Neutro Abs 2.6 1.7 - 7.7 K/uL   Lymphocytes Relative 21 %   Lymphs Abs 0.9 0.7 - 4.0 K/uL   Monocytes Relative 17 %   Monocytes Absolute 0.8 0.1 - 1.0 K/uL   Eosinophils Relative 3 %   Eosinophils Absolute 0.2 0.0 - 0.5 K/uL   Basophils Relative 0 %   Basophils Absolute 0.0 0.0 - 0.1 K/uL   Immature Granulocytes 1 %   Abs Immature  Granulocytes 0.06 0.00 - 0.07 K/uL  Ammonia     Status: None   Collection Time: 10/21/23  4:10 AM  Result Value Ref Range   Ammonia 22 9 - 35 umol/L  Glucose, capillary     Status: None   Collection Time: 10/21/23  6:22 AM  Result Value Ref Range   Glucose-Capillary 88 70 - 99 mg/dL  Glucose, capillary     Status: None   Collection Time: 10/21/23 11:14 AM  Result Value Ref Range   Glucose-Capillary 98 70 - 99 mg/dL   Comment 1 Notify RN    Comment 2 Document in Chart     I have reviewed pertinent nursing notes, vitals, labs, and images as necessary. I have ordered labwork to follow up on as indicated.  I have reviewed the last notes from staff over past 24 hours. I have discussed patient's care plan and test results with nursing staff, CM/SW, and other staff as appropriate.  Time spent: Greater than 50% of the 55 minute visit was spent in counseling/coordination of care for the patient as laid out in the A&P.   LOS: 11 days   Faith Homes, MD [facility] 10/21/2023, 3:26 PM

## 2023-10-21 NOTE — Plan of Care (Signed)

## 2023-10-21 NOTE — Procedures (Signed)
 Patient Name: Connor Dixon  MRN: [id number]  Epilepsy Attending: Arleene Lack  Referring Physician/Provider: Laymond Priestly, NP  Duration: 10/20/2023 1036 to 10/21/2023 1036   Patient history: 89 y.o. male who presented with severe encephalopathy which is currently of unclear etiology. EEG to evaluate for seizure   Level of alertness: Awake, asleep   AEDs during EEG study: None   Technical aspects: This EEG study was done with scalp electrodes positioned according to the 10-20 International system of electrode placement. Electrical activity was reviewed with band pass filter of 1-70Hz , sensitivity of 7 uV/mm, display speed of 50mm/sec with a 60Hz  notched filter applied as appropriate. EEG data were recorded continuously and digitally stored.  Video monitoring was available and reviewed as appropriate.   Description: EEG showed continuous generalized 3 to 6 Hz theta-delta slowing. Sleep was characterized by sleep spindles (12-14hz ), maximal fronto-central region. Hyperventilation and photic stimulation were not performed.      ABNORMALITY - Continuous slow, generalized   IMPRESSION: This study is suggestive of moderate diffuse encephalopathy. No seizures or epileptiform discharges were seen throughout the recording.   Dashonda Bonneau O Cristal Qadir

## 2023-10-21 NOTE — Progress Notes (Signed)
 MB troubleshoot equipment and transferred data, restarted LTM and Performed maintenance on C4, F7, FZ, and P7 electrodes.

## 2023-10-21 NOTE — Progress Notes (Signed)
 LTM maint complete - no skin breakdown under:  Fp1,Fp2. Head wrapped.

## 2023-10-22 ENCOUNTER — Inpatient Hospital Stay (HOSPITAL_COMMUNITY)

## 2023-10-22 DIAGNOSIS — R569 Unspecified convulsions: Secondary | ICD-10-CM | POA: Diagnosis not present

## 2023-10-22 DIAGNOSIS — G9340 Encephalopathy, unspecified: Secondary | ICD-10-CM | POA: Diagnosis not present

## 2023-10-22 LAB — BASIC METABOLIC PANEL WITH GFR
Anion gap: 8 (ref 5–15)
BUN: 24 mg/dL — ABNORMAL HIGH (ref 8–23)
CO2: 21 mmol/L — ABNORMAL LOW (ref 22–32)
Calcium: 8.1 mg/dL — ABNORMAL LOW (ref 8.9–10.3)
Chloride: 104 mmol/L (ref 98–111)
Creatinine, Ser: 1.51 mg/dL — ABNORMAL HIGH (ref 0.61–1.24)
GFR, Estimated: 45 mL/min — ABNORMAL LOW (ref >60)
Glucose, Bld: 195 mg/dL — ABNORMAL HIGH (ref 70–99)
Potassium: 4.1 mmol/L (ref 3.5–5.1)
Sodium: 133 mmol/L — ABNORMAL LOW (ref 135–145)

## 2023-10-22 LAB — GLUCOSE, CAPILLARY
Glucose-Capillary: 47 mg/dL — ABNORMAL LOW (ref 70–99)
Glucose-Capillary: 59 mg/dL — ABNORMAL LOW (ref 70–99)
Glucose-Capillary: 212 mg/dL — ABNORMAL HIGH (ref 70–99)
Glucose-Capillary: 226 mg/dL — ABNORMAL HIGH (ref 70–99)
Glucose-Capillary: 127 mg/dL — ABNORMAL HIGH (ref 70–99)
Glucose-Capillary: 149 mg/dL — ABNORMAL HIGH (ref 70–99)

## 2023-10-22 MED ORDER — INSULIN ASPART 100 UNIT/ML IJ SOLN
0.0000 [IU] | Freq: Every day | INTRAMUSCULAR | Status: DC
  Administered 2023-10-24: 4 [IU] via SUBCUTANEOUS
  Administered 2023-10-27: 3 [IU] via SUBCUTANEOUS
  Administered 2023-10-29: 2 [IU] via SUBCUTANEOUS
  Administered 2023-10-31: 3 [IU] via SUBCUTANEOUS

## 2023-10-22 MED ORDER — INSULIN GLARGINE-YFGN 100 UNIT/ML ~~LOC~~ SOLN
10.0000 [IU] | Freq: Every day | SUBCUTANEOUS | Status: DC
  Administered 2023-10-23 – 2023-10-25 (×3): 10 [IU] via SUBCUTANEOUS
  Filled 2023-10-22 (×3): qty 0.1

## 2023-10-22 MED ORDER — DEXTROSE 50 % IV SOLN
1.0000 | Freq: Once | INTRAVENOUS | Status: AC
  Administered 2023-10-22: 50 mL via INTRAVENOUS
  Filled 2023-10-22: qty 50

## 2023-10-22 MED ORDER — INSULIN GLARGINE-YFGN 100 UNIT/ML ~~LOC~~ SOPN: 10.0000 [IU] | PEN_INJECTOR | Freq: Every day | SUBCUTANEOUS | Status: DC

## 2023-10-22 MED ORDER — INSULIN ASPART 100 UNIT/ML IJ SOLN
0.0000 [IU] | Freq: Three times a day (TID) | INTRAMUSCULAR | Status: DC
  Administered 2023-10-22: 3 [IU] via SUBCUTANEOUS
  Administered 2023-10-22: 1 [IU] via SUBCUTANEOUS
  Administered 2023-10-23: 9 [IU] via SUBCUTANEOUS
  Administered 2023-10-23: 7 [IU] via SUBCUTANEOUS
  Administered 2023-10-24: 3 [IU] via SUBCUTANEOUS
  Administered 2023-10-24: 9 [IU] via SUBCUTANEOUS
  Administered 2023-10-24: 7 [IU] via SUBCUTANEOUS
  Administered 2023-10-25: 5 [IU] via SUBCUTANEOUS
  Administered 2023-10-25: 7 [IU] via SUBCUTANEOUS
  Administered 2023-10-25 – 2023-10-26 (×2): 3 [IU] via SUBCUTANEOUS
  Administered 2023-10-26: 5 [IU] via SUBCUTANEOUS
  Administered 2023-10-26: 2 [IU] via SUBCUTANEOUS
  Administered 2023-10-27: 7 [IU] via SUBCUTANEOUS
  Administered 2023-10-27: 1 [IU] via SUBCUTANEOUS
  Administered 2023-10-27: 7 [IU] via SUBCUTANEOUS
  Administered 2023-10-28: 1 [IU] via SUBCUTANEOUS
  Administered 2023-10-28 (×2): 3 [IU] via SUBCUTANEOUS
  Administered 2023-10-29: 2 [IU] via SUBCUTANEOUS
  Administered 2023-10-29: 7 [IU] via SUBCUTANEOUS
  Administered 2023-10-30 (×2): 5 [IU] via SUBCUTANEOUS
  Administered 2023-10-30: 2 [IU] via SUBCUTANEOUS
  Administered 2023-10-31: 3 [IU] via SUBCUTANEOUS
  Administered 2023-10-31: 2 [IU] via SUBCUTANEOUS
  Administered 2023-10-31: 3 [IU] via SUBCUTANEOUS
  Administered 2023-11-01: 5 [IU] via SUBCUTANEOUS
  Administered 2023-11-01: 3 [IU] via SUBCUTANEOUS
  Administered 2023-11-01 – 2023-11-02 (×3): 5 [IU] via SUBCUTANEOUS
  Administered 2023-11-03: 7 [IU] via SUBCUTANEOUS
  Administered 2023-11-03: 3 [IU] via SUBCUTANEOUS
  Administered 2023-11-03 – 2023-11-04 (×2): 2 [IU] via SUBCUTANEOUS
  Administered 2023-11-04: 1 [IU] via SUBCUTANEOUS
  Administered 2023-11-04: 5 [IU] via SUBCUTANEOUS
  Administered 2023-11-05: 1 [IU] via SUBCUTANEOUS
  Administered 2023-11-05: 2 [IU] via SUBCUTANEOUS
  Administered 2023-11-05: 3 [IU] via SUBCUTANEOUS
  Administered 2023-11-06: 1 [IU] via SUBCUTANEOUS
  Administered 2023-11-06: 3 [IU] via SUBCUTANEOUS
  Administered 2023-11-07: 1 [IU] via SUBCUTANEOUS
  Administered 2023-11-07 (×2): 2 [IU] via SUBCUTANEOUS
  Administered 2023-11-08: 1 [IU] via SUBCUTANEOUS
  Administered 2023-11-08: 3 [IU] via SUBCUTANEOUS

## 2023-10-22 NOTE — Procedures (Addendum)
 Patient Name: Connor Dixon  MRN: [id number]  Epilepsy Attending: Arleene Lack  Referring Physician/Provider: Laymond Priestly, NP  Duration: 10/21/2023 1036 to 10/22/2023 1036   Patient history: 89 y.o. male who presented with severe encephalopathy which is currently of unclear etiology. EEG to evaluate for seizure   Level of alertness: Awake, asleep   AEDs during EEG study: None   Technical aspects: This EEG study was done with scalp electrodes positioned according to the 10-20 International system of electrode placement. Electrical activity was reviewed with band pass filter of 1-70Hz , sensitivity of 7 uV/mm, display speed of 84mm/sec with a 60Hz  notched filter applied as appropriate. EEG data were recorded continuously and digitally stored.  Video monitoring was available and reviewed as appropriate.   Description: EEG showed continuous generalized 3 to 6 Hz theta-delta slowing. Sleep was characterized by sleep spindles (12-14hz ), maximal fronto-central region. Hyperventilation and photic stimulation were not performed.      ABNORMALITY - Continuous slow, generalized   IMPRESSION: This study is suggestive of moderate diffuse encephalopathy. No seizures or epileptiform discharges were seen throughout the recording.   [name]

## 2023-10-22 NOTE — Progress Notes (Signed)
 LTM maint complete - no skin breakdown under:  A1,Fz

## 2023-10-22 NOTE — Progress Notes (Addendum)
 Progress Note    Connor Dixon   XWR:[id number]  DOB: 17-May-1934  DOA: 10/10/2023     12 PCP: Arcadio Knuckles, MD  Initial CC: Found down at home  Hospital Course: Mr. Lingafelter is an 89 yo male with PMH chronic diastolic CHF, complete AV block s/p pacemaker, CKD 3, diabetes, HTN, HLD who presented after being found slumped over in his chair at home and unresponsive. He was spoken to on the phone on the evening of 10/09/2023 and upon being found on 10/10/2023 by the cleaner he was noted to be unresponsive slumped over the chair. At baseline he is considered high functioning and ambulates independently and lives alone.  He was admitted for further workup of altered mentation.   In the ED the patient underwent a CT head that demonstrated no acute process. A CTA head and neck demonstrated no LVO. There was however evidence of a remote lacunar infarct of the anterior right corona radiata. There was concern for Seizure, Posterior Circulation CVA, and meningitis. The patient will need to be off of anticoagulation for at least 48 hours. LP is planned for 10/12/2023. The patient will be started on empiric antibiotic coverage for meningitis.    MRI was performed and demonstrated no acute intracranial abnormality. There was evidence for chronic small vessel disease. There was also generalized appearing brain volume loss. EEG unremarkable. Neurology has evaluated the patient. Plan is for LP on the afternoon of 10/12/2023.    On 10/13/2023 the patient again appears completely obtunded. This may be due to ativan  given for MRI on 10/12/2023, but in any case the patient is not as alert as he was on 10/12/2023.    MRI brain has ruled out CVA.   LP performed on 10/13/2023 has ruled out meningitis. Have added on VDRL of CSF onto studies for the CSF. Antibiotics for meningitis stopped as per neurology. However, steroids continued in case there is an autoimmune component to the patient's illness.  Steroid was  discontinued on 10/14/2023.   10/14/2023 the patient is much improved according to the family at bedside. The patient has cracked a joke with the phlebotomist, eaten some breakfast, and been able to open his phone with his password. He has spoken.   In the following days, he again regressed and could not communicate.  He would only babble or say yes/no inappropriately.  He would also not be able to follow commands at all.  He eats if fed most of the time.    Assessment and Plan:    Acute encephalopathy - MRI neg for stroke on 5/29.  - EEG no seizure like activity even on continuous EEG - LP no meningitis. 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  Mostly babbles throughout the day and says yes/no inappropriately; cannot get him to follow commands. If no major improvement after IVIG the other theory might have to be some level of dementia that was already present and worsened with the acute illness that led to hospitalization - given extensive workup that's been  negative; remaining realistic differentials still include something such as a rapid neurocognitive decline from frontotemporal dementia vs global cognitive decline (neurodegeneration) vs vascular dementia. But I believe at this point it seems to be neurologic in origin and was rapid  Suspected cognitive impairment, POA - see above as well - patient is described as having some level of decreasing cognition over at least weeks to months prior to admission;  more forgetfulness with directions, cooking/prepping meals, finances, and not driving lawn mower well - quite possible as noted above that underlying cognitive impairment became acutely worsened in the setting of this acute illness - might need more time to acclimate and return to close to baseline but only time will tell at this rate; this could very well be a rapid decline without improvement   Essential hypertension - started amlodipine  (home losart and coreg  are on hold)   Elevated serum osm gap Etiology unclear. Not acidotic. Family thinks toxic alcohol ingestion unlikely. Discussed with dr. Arno Bibles of hematology, would be abnormal for paraproteinemia to present with purely encephalopathy, he advises following labs ordered. - IgG normal/elevated; no free kappa chains; no M-spike   Elevated troponin Appears to be demand - Echo unrmarkable   Acute urinary retention - resolved -Foley discontinued 6/2   Hyperlipidemia with target LDL less than 70 Not able to tolerate PO statin at this time   Insulin -requiring or dependent type II diabetes mellitus (HCC) - Last A1c 8.7% on 09/06/2023 - Some intermittent hypoglycemia.  Regimen adjusted Continue basal/bolus/ssi   Paroxysmal atrial fibrillation (HCC) - Continue Eliquis  and Lopressor    Sinus node dysfunction (HCC) - s/p pacemaker   Prolonged QT interval - will monitor on tele avoid QT prolonging medications, rehydrate correct electrolytes  Interval History:  Still no improvement.  Continues to say yes/no inappropriately and occasionally says "shit" when eliciting painful stimuli or doing something he does not want to do.  Seems to be his way of saying "no". Does not follow commands.   EEG ongoing. Family bedside.    Old records reviewed in assessment of this patient  Antimicrobials:  DVT prophylaxis:  apixaban  (ELIQUIS ) tablet 2.5 mg Start: 10/15/23 1430 SCDs Start: 10/15/23 1335 SCD's Start: 10/10/23 2338 apixaban  (ELIQUIS )  tablet 2.5 mg   Code Status:   Code Status: Limited: Do not attempt resuscitation (DNR) -DNR-LIMITED -Do Not Intubate/DNI   Mobility Assessment (Last 72 Hours)     Mobility Assessment     Row Name 10/22/23 1215 10/22/23 0837 10/21/23 2000 10/21/23 1600 10/21/23 1230   Does patient have an order for bedrest or is patient medically unstable No - Continue assessment No - Continue assessment No - Continue assessment No - Continue assessment No - Continue assessment   What is the highest level of mobility based on the progressive mobility assessment? Level 1 (Bedfast) - Unable to balance while sitting on edge of bed Level 1 (Bedfast) - Unable to balance while sitting on edge of bed Level 1 (Bedfast) - Unable to balance while sitting on edge of bed Level 1 (Bedfast) - Unable to balance while sitting on edge of bed Level 1 (Bedfast) - Unable to balance while sitting on edge of bed   Is the above level different from baseline mobility prior to current illness? Yes - Recommend PT order Yes - Recommend PT order Yes - Recommend PT order Yes - Recommend PT order Yes - Recommend PT order    Row Name 10/21/23 0850 10/21/23 0556 10/21/23 0445 10/21/23 0400 10/21/23 0000  Does patient have an order for bedrest or is patient medically unstable No - Continue assessment No - Continue assessment No - Continue assessment No - Continue assessment No - Continue assessment   What is the highest level of mobility based on the progressive mobility assessment? Level 1 (Bedfast) - Unable to balance while sitting on edge of bed Level 1 (Bedfast) - Unable to balance while sitting on edge of bed Level 1 (Bedfast) - Unable to balance while sitting on edge of bed Level 1 (Bedfast) - Unable to balance while sitting on edge of bed Level 1 (Bedfast) - Unable to balance while sitting on edge of bed   Is the above level different from baseline mobility prior to current illness? Yes - Recommend PT order Yes - Recommend PT order Yes -  Recommend PT order Yes - Recommend PT order Yes - Recommend PT order    Row Name 10/20/23 2221 10/20/23 2000 10/20/23 1600 10/20/23 1424 10/20/23 1306   Does patient have an order for bedrest or is patient medically unstable No - Continue assessment No - Continue assessment No - Continue assessment -- --   What is the highest level of mobility based on the progressive mobility assessment? Level 1 (Bedfast) - Unable to balance while sitting on edge of bed Level 1 (Bedfast) - Unable to balance while sitting on edge of bed Level 1 (Bedfast) - Unable to balance while sitting on edge of bed Level 1 (Bedfast) - Unable to balance while sitting on edge of bed Level 1 (Bedfast) - Unable to balance while sitting on edge of bed   Is the above level different from baseline mobility prior to current illness? Yes - Recommend PT order Yes - Recommend PT order Yes - Recommend PT order -- --    Row Name 10/20/23 1200 10/20/23 0900 10/20/23 0545 10/20/23 0400 10/19/23 2145   Does patient have an order for bedrest or is patient medically unstable No - Continue assessment No - Continue assessment No - Continue assessment No - Continue assessment No - Continue assessment   What is the highest level of mobility based on the progressive mobility assessment? Level 1 (Bedfast) - Unable to balance while sitting on edge of bed Level 1 (Bedfast) - Unable to balance while sitting on edge of bed Level 1 (Bedfast) - Unable to balance while sitting on edge of bed Level 1 (Bedfast) - Unable to balance while sitting on edge of bed Level 1 (Bedfast) - Unable to balance while sitting on edge of bed   Is the above level different from baseline mobility prior to current illness? Yes - Recommend PT order Yes - Recommend PT order Yes - Recommend PT order Yes - Recommend PT order Yes - Recommend PT order    Row Name 10/19/23 2000           Does patient have an order for bedrest or is patient medically unstable No - Continue assessment        What is the highest level of mobility based on the progressive mobility assessment? Level 1 (Bedfast) - Unable to balance while sitting on edge of bed       Is the above level different from baseline mobility prior to current illness? Yes - Recommend PT order                Barriers to discharge:  Disposition Plan: SNF HH orders placed: TBD Status is: Inpt  Objective: Blood pressure 130/80, pulse 73, temperature  98.9 F (37.2 C), temperature source Oral, resp. rate 18, weight 91 kg, SpO2 100%.  Examination:  Physical Exam Constitutional:      Comments: Awake and resting in bed in no distress but not fully able to interact  HENT:     Head: Normocephalic and atraumatic.     Mouth/Throat:     Mouth: Mucous membranes are moist.  Eyes:     Extraocular Movements: Extraocular movements intact.  Cardiovascular:     Rate and Rhythm: Normal rate and regular rhythm.  Pulmonary:     Effort: Pulmonary effort is normal. No respiratory distress.     Breath sounds: Normal breath sounds. No wheezing.  Abdominal:     General: Bowel sounds are normal. There is no distension.     Palpations: Abdomen is soft.     Tenderness: There is no abdominal tenderness.  Musculoskeletal:        General: Normal range of motion.     Cervical back: Normal range of motion and neck supple.  Skin:    General: Skin is warm and dry.  Neurological:     Comments: Mostly answers yes to questions but inappropriately.  Withdraws to pain in all 4 extremities appropriately      Consultants:  Neurology  Procedures:    Data Reviewed: Results for orders placed or performed during the hospital encounter of 10/10/23 (from the past 24 hours)  Glucose, capillary     Status: Abnormal   Collection Time: 10/21/23  5:30 PM  Result Value Ref Range   Glucose-Capillary 183 (H) 70 - 99 mg/dL   Comment 1 Notify RN    Comment 2 Document in Chart   Glucose, capillary     Status: Abnormal   Collection Time: 10/21/23   9:14 PM  Result Value Ref Range   Glucose-Capillary 194 (H) 70 - 99 mg/dL   Comment 1 Notify RN    Comment 2 Document in Chart   Glucose, capillary     Status: Abnormal   Collection Time: 10/22/23  6:13 AM  Result Value Ref Range   Glucose-Capillary 47 (L) 70 - 99 mg/dL  Glucose, capillary     Status: Abnormal   Collection Time: 10/22/23  6:46 AM  Result Value Ref Range   Glucose-Capillary 59 (L) 70 - 99 mg/dL  Glucose, capillary     Status: Abnormal   Collection Time: 10/22/23  7:34 AM  Result Value Ref Range   Glucose-Capillary 212 (H) 70 - 99 mg/dL   Comment 1 Notify RN   Basic metabolic panel with GFR     Status: Abnormal   Collection Time: 10/22/23  8:09 AM  Result Value Ref Range   Sodium 133 (L) 135 - 145 mmol/L   Potassium 4.1 3.5 - 5.1 mmol/L   Chloride 104 98 - 111 mmol/L   CO2 21 (L) 22 - 32 mmol/L   Glucose, Bld 195 (H) 70 - 99 mg/dL   BUN 24 (H) 8 - 23 mg/dL   Creatinine, Ser 9.56 (H) 0.61 - 1.24 mg/dL   Calcium 8.1 (L) 8.9 - 10.3 mg/dL   GFR, Estimated 45 (L) >60 mL/min   Anion gap 8 5 - 15  Glucose, capillary     Status: Abnormal   Collection Time: 10/22/23 12:48 PM  Result Value Ref Range   Glucose-Capillary 226 (H) 70 - 99 mg/dL   Comment 1 Notify RN     I have reviewed pertinent nursing notes, vitals, labs, and images as necessary. I  have ordered labwork to follow up on as indicated.  I have reviewed the last notes from staff over past 24 hours. I have discussed patient's care plan and test results with nursing staff, CM/SW, and other staff as appropriate.  Time spent: Greater than 50% of the 55 minute visit was spent in counseling/coordination of care for the patient as laid out in the A&P.   LOS: 12 days   Faith Homes, MD [facility] 10/22/2023, 3:46 PM

## 2023-10-22 NOTE — Plan of Care (Signed)
  Problem: Health Behavior/Discharge Planning: Goal: Ability to manage health-related needs will improve Outcome: Progressing   Problem: Nutritional: Goal: Maintenance of adequate nutrition will improve Outcome: Progressing   Problem: Skin Integrity: Goal: Risk for impaired skin integrity will decrease Outcome: Progressing   Problem: Health Behavior/Discharge Planning: Goal: Ability to manage health-related needs will improve Outcome: Progressing   Problem: Self-Care: Goal: Ability to communicate needs accurately will improve Outcome: Progressing   Problem: Activity: Goal: Risk for activity intolerance will decrease Outcome: Progressing   Problem: Coping: Goal: Level of anxiety will decrease Outcome: Progressing   Problem: Elimination: Goal: Will not experience complications related to urinary retention Outcome: Progressing   Problem: Safety: Goal: Ability to remain free from injury will improve Outcome: Progressing   Problem: Skin Integrity: Goal: Risk for impaired skin integrity will decrease Outcome: Progressing

## 2023-10-22 NOTE — Progress Notes (Addendum)
 Pt had an episode of Hypoglycemia  of 49mg /dl at 9629, given orange juice based on hypoglycemic protocol. Rechecked at 0646, it is still hypogly at 59 mg/dl, again given orange juice. Informed also Dr. [facility] Lavender at [phone], ordered D50 and were given.

## 2023-10-23 ENCOUNTER — Inpatient Hospital Stay (HOSPITAL_COMMUNITY)

## 2023-10-23 DIAGNOSIS — R569 Unspecified convulsions: Secondary | ICD-10-CM | POA: Diagnosis not present

## 2023-10-23 DIAGNOSIS — G9340 Encephalopathy, unspecified: Secondary | ICD-10-CM | POA: Diagnosis not present

## 2023-10-23 LAB — GLUCOSE, CAPILLARY
Glucose-Capillary: 101 mg/dL — ABNORMAL HIGH (ref 70–99)
Glucose-Capillary: 324 mg/dL — ABNORMAL HIGH (ref 70–99)
Glucose-Capillary: 364 mg/dL — ABNORMAL HIGH (ref 70–99)
Glucose-Capillary: 173 mg/dL — ABNORMAL HIGH (ref 70–99)

## 2023-10-23 LAB — BASIC METABOLIC PANEL WITH GFR
Anion gap: 5 (ref 5–15)
BUN: 22 mg/dL (ref 8–23)
CO2: 22 mmol/L (ref 22–32)
Calcium: 8.3 mg/dL — ABNORMAL LOW (ref 8.9–10.3)
Chloride: 106 mmol/L (ref 98–111)
Creatinine, Ser: 1.69 mg/dL — ABNORMAL HIGH (ref 0.61–1.24)
GFR, Estimated: 39 mL/min — ABNORMAL LOW (ref >60)
Glucose, Bld: 95 mg/dL (ref 70–99)
Potassium: 4.9 mmol/L (ref 3.5–5.1)
Sodium: 133 mmol/L — ABNORMAL LOW (ref 135–145)

## 2023-10-23 MED ORDER — POLYVINYL ALCOHOL 1.4 % OP SOLN
1.0000 [drp] | OPHTHALMIC | Status: DC | PRN
  Administered 2023-10-23 – 2023-10-30 (×4): 1 [drp] via OPHTHALMIC
  Filled 2023-10-23: qty 15

## 2023-10-23 NOTE — Procedures (Signed)
 Patient Name: Connor Dixon  MRN: [id number]  Epilepsy Attending: Arleene Lack  Referring Physician/Provider: Laymond Priestly, NP  Duration: 10/22/2023 1036 to 10/23/2023 1013   Patient history: 89 y.o. male who presented with severe encephalopathy which is currently of unclear etiology. EEG to evaluate for seizure   Level of alertness: Awake, asleep   AEDs during EEG study: None   Technical aspects: This EEG study was done with scalp electrodes positioned according to the 10-20 International system of electrode placement. Electrical activity was reviewed with band pass filter of 1-70Hz , sensitivity of 7 uV/mm, display speed of 66mm/sec with a 60Hz  notched filter applied as appropriate. EEG data were recorded continuously and digitally stored.  Video monitoring was available and reviewed as appropriate.   Description: EEG showed continuous generalized 3 to 6 Hz theta-delta slowing. Sleep was characterized by sleep spindles (12-14hz ), maximal fronto-central region. Hyperventilation and photic stimulation were not performed.      ABNORMALITY - Continuous slow, generalized   IMPRESSION: This study is suggestive of moderate diffuse encephalopathy. No seizures or epileptiform discharges were seen throughout the recording.   Allianna Beaubien O Maryiah Olvey

## 2023-10-23 NOTE — Plan of Care (Signed)
 Has been much calmer today.  Does get slightly anxious and has been crossways in the bed but has not been attempting to get up.  Smiling and repeating words and attempting to converse.    Problem: Skin Integrity: Goal: Risk for impaired skin integrity will decrease Outcome: Progressing   Problem: Tissue Perfusion: Goal: Adequacy of tissue perfusion will improve Outcome: Progressing   Problem: Coping: Goal: Will verbalize positive feelings about self Outcome: Progressing   Problem: Nutrition: Goal: Dietary intake will improve Outcome: Progressing   Problem: Activity: Goal: Risk for activity intolerance will decrease Outcome: Progressing   Problem: Nutrition: Goal: Adequate nutrition will be maintained Outcome: Progressing   Problem: Coping: Goal: Level of anxiety will decrease Outcome: Progressing

## 2023-10-23 NOTE — Progress Notes (Signed)
 Progress Note    Connor Dixon   ION:[id number]  DOB: 08-23-1934  DOA: 10/10/2023     13 PCP: Arcadio Knuckles, MD  Initial CC: Found down at home  Hospital Course: [name] is an 89 yo male with PMH chronic diastolic CHF, complete AV block s/p pacemaker, CKD 3, diabetes, HTN, HLD who presented after being found slumped over in his chair at home and unresponsive. He was spoken to on the phone on the evening of 10/09/2023 and upon being found on 10/10/2023 by the cleaner he was noted to be unresponsive slumped over the chair. At baseline he is considered high functioning and ambulates independently and lives alone.  He was admitted for further workup of altered mentation.   In the ED the patient underwent a CT head that demonstrated no acute process. A CTA head and neck demonstrated no LVO. There was however evidence of a remote lacunar infarct of the anterior right corona radiata. There was concern for Seizure, Posterior Circulation CVA, and meningitis. The patient will need to be off of anticoagulation for at least 48 hours. LP is planned for 10/12/2023. The patient will be started on empiric antibiotic coverage for meningitis.    MRI was performed and demonstrated no acute intracranial abnormality. There was evidence for chronic small vessel disease. There was also generalized appearing brain volume loss. EEG unremarkable. Neurology has evaluated the patient. Plan is for LP on the afternoon of 10/12/2023.    On 10/13/2023 the patient again appears completely obtunded. This may be due to ativan  given for MRI on 10/12/2023, but in any case the patient is not as alert as he was on 10/12/2023.    MRI brain has ruled out CVA.   LP performed on 10/13/2023 has ruled out meningitis. Have added on VDRL of CSF onto studies for the CSF. Antibiotics for meningitis stopped as per neurology. However, steroids continued in case there is an autoimmune component to the patient's illness.  Steroid was  discontinued on 10/14/2023.   10/14/2023 the patient is much improved according to the family at bedside. The patient has cracked a joke with the phlebotomist, eaten some breakfast, and been able to open his phone with his password. He has spoken.   In the following days, he again regressed and could not communicate.  He would only babble or say yes/no inappropriately.  He would also not be able to follow commands at all.  He eats if fed most of the time.    Assessment and Plan:    Acute encephalopathy - MRI neg for stroke on 5/29.  - EEG no seizure like activity even on continuous EEG over several days - LP no meningitis. 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  Mostly babbles throughout the day and says yes/no inappropriately; cannot get him to follow commands. If no major improvement after IVIG the other theory might have to be some level of dementia that was already present and worsened with the acute illness that led to hospitalization - given extensive  workup that's been negative; remaining realistic differentials still include something such as a rapid neurocognitive decline from frontotemporal dementia vs global cognitive decline (neurodegeneration) vs vascular dementia. But I believe at this point it seems to be neurologic in origin and was rapid - at this point, after discussion with family also; patient simply cannot safely be discharged to SNF until he could at least consistently follow commands and/or  use a call/safety bell if necessary and/or consistently engage with PT/OT. He cannot do any of this and sending to rehab would be futile and likely more harm than good. Furthermore, family awaiting last test result from 10/14/23, (the AI enceph panel on CSF sent to [facility] clinic) which may also take several more days or weeks, but regardless, it's futile to send patient anywhere in this current state (seemingly all he is capable of doing is laying in bed and being fed). Informed neurology to also continue watching lab to result in order to discuss findings with patient's son in order to help family make more decisions on next steps as well  Suspected cognitive impairment, POA - see above as well - patient is described as having some level of decreasing cognition over at least weeks to months prior to admission; more forgetfulness with directions, cooking/prepping meals, finances, and not driving lawn mower well - quite possible as noted above that underlying cognitive impairment became acutely worsened in the setting of this acute illness - might need more time to acclimate and return to close to baseline but only time will tell at this rate; this could very well be a progressive rapid decline without improvement   Acute on CKD3b - varying ranges but baselines seem to be creat ~ 1.5 -1.6 and GFR 37-40 - BUN has normalized and creat back to it's baseline  Insulin -requiring or dependent type II diabetes mellitus (HCC) - Last A1c 8.7% on 09/06/2023 - Some intermittent hypoglycemia.  Regimen adjusted Continue basal/bolus/ssi  Essential hypertension - started amlodipine  (home losart and coreg  are on hold)   Elevated serum osm gap Etiology unclear. Not acidotic. Family thinks toxic alcohol ingestion unlikely. Discussed with dr. Arno Bibles of hematology, would be abnormal for paraproteinemia to present with purely encephalopathy, he advises following labs ordered. - IgG normal/elevated; no free kappa chains;  no M-spike   Elevated troponin Appears to be demand - Echo unrmarkable   Acute urinary retention - resolved -Foley discontinued 6/2   Hyperlipidemia with target LDL less than 70 Not able to tolerate PO statin at this time   Paroxysmal atrial fibrillation (HCC) - Continue Eliquis  and Lopressor    Sinus node dysfunction (HCC) - s/p pacemaker   Prolonged QT interval - will monitor on tele avoid QT prolonging medications, rehydrate correct electrolytes  Interval History:  Still no improvement.  Can only say yes/no/"shit" but mostly "shit" or just mumbles when trying to answer some questions. Still appears more dementia-like after having seen him 6 days in a row now. He's never been able to follow a single command for me thus far nor answer a question appropriately no matter how basic.  Have been empathetically honest with family over several days, but they do seem to be holding out for the pending autoimmune panel sent to [facility] from his CSF hoping that explains some of this and that patient eventually shows some signs of improvement.  Regardless, he wouldn't do well in rehab right now anyways if he went there as he really can't do anything unfortunately.    Old records reviewed  in assessment of this patient  Antimicrobials:  DVT prophylaxis:  apixaban  (ELIQUIS ) tablet 2.5 mg Start: 10/15/23 1430 SCDs Start: 10/15/23 1335 SCD's Start: 10/10/23 2338 apixaban  (ELIQUIS ) tablet 2.5 mg   Code Status:   Code Status: Limited: Do not attempt resuscitation (DNR) -DNR-LIMITED -Do Not Intubate/DNI   Mobility Assessment (Last 72 Hours)     Mobility Assessment     Row Name 10/23/23 0800 10/23/23 0545 10/23/23 0443 10/23/23 0200 10/22/23 2121   Does patient have an order for bedrest or is patient medically unstable No - Continue assessment No - Continue assessment No - Continue assessment No - Continue assessment No - Continue assessment   What is the highest level of mobility based on the  progressive mobility assessment? Level 1 (Bedfast) - Unable to balance while sitting on edge of bed Level 1 (Bedfast) - Unable to balance while sitting on edge of bed Level 1 (Bedfast) - Unable to balance while sitting on edge of bed Level 1 (Bedfast) - Unable to balance while sitting on edge of bed Level 1 (Bedfast) - Unable to balance while sitting on edge of bed   Is the above level different from baseline mobility prior to current illness? Yes - Recommend PT order Yes - Recommend PT order Yes - Recommend PT order Yes - Recommend PT order Yes - Recommend PT order    Row Name 10/22/23 2000 10/22/23 1600 10/22/23 1215 10/22/23 0837 10/21/23 2000   Does patient have an order for bedrest or is patient medically unstable No - Continue assessment No - Continue assessment No - Continue assessment No - Continue assessment No - Continue assessment   What is the highest level of mobility based on the progressive mobility assessment? Level 1 (Bedfast) - Unable to balance while sitting on edge of bed Level 1 (Bedfast) - Unable to balance while sitting on edge of bed Level 1 (Bedfast) - Unable to balance while sitting on edge of bed Level 1 (Bedfast) - Unable to balance while sitting on edge of bed Level 1 (Bedfast) - Unable to balance while sitting on edge of bed   Is the above level different from baseline mobility prior to current illness? Yes - Recommend PT order Yes - Recommend PT order Yes - Recommend PT order Yes - Recommend PT order Yes - Recommend PT order    Row Name 10/21/23 1600 10/21/23 1230 10/21/23 0850 10/21/23 0556 10/21/23 0445   Does patient have an order for bedrest or is patient medically unstable No - Continue assessment No - Continue assessment No - Continue assessment No - Continue assessment No - Continue assessment   What is the highest level of mobility based on the progressive mobility assessment? Level 1 (Bedfast) - Unable to balance while sitting on edge of bed Level 1 (Bedfast) -  Unable to balance while sitting on edge of bed Level 1 (Bedfast) - Unable to balance while sitting on edge of bed Level 1 (Bedfast) - Unable to balance while sitting on edge of bed Level 1 (Bedfast) - Unable to balance while sitting on edge of bed   Is the above level different from baseline mobility prior to current illness? Yes - Recommend PT order Yes - Recommend PT order Yes - Recommend PT order Yes - Recommend PT order Yes - Recommend PT order    Row Name 10/21/23 0400 10/21/23 0000 10/20/23 2221 10/20/23 2000 10/20/23 1600   Does patient have an order for bedrest or is patient medically unstable No -  Continue assessment No - Continue assessment No - Continue assessment No - Continue assessment No - Continue assessment   What is the highest level of mobility based on the progressive mobility assessment? Level 1 (Bedfast) - Unable to balance while sitting on edge of bed Level 1 (Bedfast) - Unable to balance while sitting on edge of bed Level 1 (Bedfast) - Unable to balance while sitting on edge of bed Level 1 (Bedfast) - Unable to balance while sitting on edge of bed Level 1 (Bedfast) - Unable to balance while sitting on edge of bed   Is the above level different from baseline mobility prior to current illness? Yes - Recommend PT order Yes - Recommend PT order Yes - Recommend PT order Yes - Recommend PT order Yes - Recommend PT order    Row Name 10/20/23 1424           What is the highest level of mobility based on the progressive mobility assessment? Level 1 (Bedfast) - Unable to balance while sitting on edge of bed                Barriers to discharge:  Disposition Plan: SNF eventually if able HH orders placed: TBD Status is: Inpt  Objective: Blood pressure 130/89, pulse 78, temperature 98.4 F (36.9 C), temperature source Axillary, resp. rate 18, weight 91 kg, SpO2 100%.  Examination:  Physical Exam Constitutional:      Comments: Awake and resting in bed in no distress but not  able to interact. Cannot follow commands  HENT:     Head: Normocephalic and atraumatic.     Mouth/Throat:     Mouth: Mucous membranes are moist.  Eyes:     Extraocular Movements: Extraocular movements intact.  Cardiovascular:     Rate and Rhythm: Normal rate and regular rhythm.  Pulmonary:     Effort: Pulmonary effort is normal. No respiratory distress.     Breath sounds: Normal breath sounds. No wheezing.  Abdominal:     General: Bowel sounds are normal. There is no distension.     Palpations: Abdomen is soft.     Tenderness: There is no abdominal tenderness.  Musculoskeletal:        General: Normal range of motion.     Cervical back: Normal range of motion and neck supple.  Skin:    General: Skin is warm and dry.  Neurological:     Comments: Mostly answers yes to questions but inappropriately.  Withdraws to pain in all 4 extremities appropriately      Consultants:  Neurology  Procedures:    Data Reviewed: Results for orders placed or performed during the hospital encounter of 10/10/23 (from the past 24 hours)  Glucose, capillary     Status: Abnormal   Collection Time: 10/22/23  5:01 PM  Result Value Ref Range   Glucose-Capillary 127 (H) 70 - 99 mg/dL   Comment 1 Notify RN   Glucose, capillary     Status: Abnormal   Collection Time: 10/22/23  9:08 PM  Result Value Ref Range   Glucose-Capillary 149 (H) 70 - 99 mg/dL  Basic metabolic panel with GFR     Status: Abnormal   Collection Time: 10/23/23  4:40 AM  Result Value Ref Range   Sodium 133 (L) 135 - 145 mmol/L   Potassium 4.9 3.5 - 5.1 mmol/L   Chloride 106 98 - 111 mmol/L   CO2 22 22 - 32 mmol/L   Glucose, Bld 95 70 - 99 mg/dL  BUN 22 8 - 23 mg/dL   Creatinine, Ser 8.29 (H) 0.61 - 1.24 mg/dL   Calcium 8.3 (L) 8.9 - 10.3 mg/dL   GFR, Estimated 39 (L) >60 mL/min   Anion gap 5 5 - 15  Glucose, capillary     Status: Abnormal   Collection Time: 10/23/23  6:16 AM  Result Value Ref Range   Glucose-Capillary 101  (H) 70 - 99 mg/dL  Glucose, capillary     Status: Abnormal   Collection Time: 10/23/23 11:40 AM  Result Value Ref Range   Glucose-Capillary 324 (H) 70 - 99 mg/dL    I have reviewed pertinent nursing notes, vitals, labs, and images as necessary. I have ordered labwork to follow up on as indicated.  I have reviewed the last notes from staff over past 24 hours. I have discussed patient's care plan and test results with nursing staff, CM/SW, and other staff as appropriate.  Time spent: Greater than 50% of the 55 minute visit was spent in counseling/coordination of care for the patient as laid out in the A&P.   LOS: 13 days   Faith Homes, MD [facility] 10/23/2023, 1:45 PM

## 2023-10-23 NOTE — Plan of Care (Signed)
  Problem: Clinical Measurements: Goal: Ability to maintain clinical measurements within normal limits will improve Outcome: Progressing Goal: Will remain free from infection Outcome: Progressing Goal: Diagnostic test results will improve Outcome: Progressing Goal: Respiratory complications will improve Outcome: Progressing Goal: Cardiovascular complication will be avoided Outcome: Progressing   Problem: Nutrition: Goal: Adequate nutrition will be maintained Outcome: Progressing   Problem: Elimination: Goal: Will not experience complications related to bowel motility Outcome: Progressing Goal: Will not experience complications related to urinary retention Outcome: Progressing   Problem: Safety: Goal: Ability to remain free from injury will improve Outcome: Progressing   Problem: Pain Managment: Goal: General experience of comfort will improve and/or be controlled Outcome: Progressing

## 2023-10-23 NOTE — Progress Notes (Signed)
 Physical Therapy Treatment Patient Details Name: Connor Dixon MRN: [id number] DOB: 1934-05-18 Today's Date: 10/23/2023   History of Present Illness Pt is 89 year old presented to [facility] on  10/10/23 after found at home unresponsive. Pt with dense encephalopathy with flexor positioning of UE's. LP performed on 10/13/2023 has ruled out meningitis. CT and MRI on 5/29 were negative for acute intercranial abnormality.  PMH - afib on Eliquis , pacer, HTN, CKD, DM, obesity    PT Comments  Patient progressing slowly.  Able to initiate weight though legs though did not sustain or repeat upon further trials.  Patient needing more salient items for improved focus.  Quickly loses focus back to internal distractions.  Patient remains appropriate for skilled PT in the acute setting.  Recommend post-acute inpatient rehab (<3 hours/day) at d/c.     If plan is discharge home, recommend the following: Two people to help with walking and/or transfers;Assistance with cooking/housework;Supervision due to cognitive status;Assist for transportation;Help with stairs or ramp for entrance   Can travel by private vehicle     No  Equipment Recommendations  BSC/3in1;Wheelchair cushion (measurements PT);Wheelchair (measurements PT);Hoyer lift;Hospital bed    Recommendations for Other Services       Precautions / Restrictions Precautions Precautions: Fall Recall of Precautions/Restrictions: Impaired     Mobility  Bed Mobility Overal bed mobility: Needs Assistance Bed Mobility: Supine to Sit, Sit to Supine     Supine to sit: Max assist, +2 for physical assistance Sit to supine: Max assist, +2 for physical assistance   General bed mobility comments: increased time to see if pt will initiate with gesturing cues as son relates he was pulling on rail and getting legs off EOB earlier today; assist for lifting trunk, scooting hips and legs off EOB; to supine assist for legs onto bed and to scoot up in bed     Transfers Overall transfer level: Needs assistance   Transfers: Sit to/from Stand Sit to Stand: Max assist, +2 physical assistance, Via lift equipment, From elevated surface           General transfer comment: using Stedy for pt to pull up and to aid in anterior weight shift; performed first with pt pushing up with legs x 1, subsequent trials with lifting help x 4 reps pt not initiating to lift up on legs much Transfer via Lift Equipment: Stedy  Ambulation/Gait                   Stairs             Wheelchair Mobility     Tilt Bed    Modified Rankin (Stroke Patients Only)       Balance Overall balance assessment: Needs assistance Sitting-balance support: Feet supported Sitting balance-Leahy Scale: Zero Sitting balance - Comments: max to total A for EOB sitting due to leaning R and back Postural control: Right lateral lean, Posterior lean Standing balance support: Bilateral upper extremity supported Standing balance-Leahy Scale: Zero Standing balance comment: unable to stand fully in [employer] Communication Communication: Impaired Factors Affecting Communication: Difficulty expressing self  Cognition Arousal: Alert Behavior During Therapy: Flat affect   PT - Cognitive impairments: Initiation, Attention, Problem solving, Awareness, Safety/Judgement Difficult to assess due to: Impaired communication  PT - Cognition Comments: "evidently" (perseverative); internal distractions with focused attention Following commands: Impaired Following commands impaired: Follows one step commands inconsistently    Cueing Cueing Techniques: Gestural cues, Verbal cues, Tactile cues, Visual cues  Exercises Other Exercises Other Exercises: attempted to engage pt's attention with tooth brush, with plant in the room    General Comments General comments (skin integrity, edema, etc.): son  present and supporting him from behind during sit to stand trials in [facility]      Pertinent Vitals/Pain Pain Assessment Pain Assessment: Faces Faces Pain Scale: Hurts a little bit Pain Location: intermittently curses during assisted mobility possibly due to discomfort or due to startle Pain Descriptors / Indicators: Guarding, Grimacing Pain Intervention(s): Monitored during session, Limited activity within patient's tolerance    Home Living                          Prior Function            PT Goals (current goals can now be found in the care plan section) Progress towards PT goals: Progressing toward goals    Frequency    Min 2X/week      PT Plan      Co-evaluation              AM-PAC PT "6 Clicks" Mobility   Outcome Measure  Help needed turning from your back to your side while in a flat bed without using bedrails?: A Lot Help needed moving from lying on your back to sitting on the side of a flat bed without using bedrails?: Total Help needed moving to and from a bed to a chair (including a wheelchair)?: Total Help needed standing up from a chair using your arms (e.g., wheelchair or bedside chair)?: Total Help needed to walk in hospital room?: Total Help needed climbing 3-5 steps with a railing? : Total 6 Click Score: 7    End of Session Equipment Utilized During Treatment: Gait belt Activity Tolerance: Patient limited by fatigue;Other (comment) (and limited focus) Patient left: in bed;with bed alarm set;with family/visitor present   PT Visit Diagnosis: Other abnormalities of gait and mobility (R26.89);Other symptoms and signs involving the nervous system (R29.898);Muscle weakness (generalized) (M62.81)     Time: 1435-1500 PT Time Calculation (min) (ACUTE ONLY): 25 min  Charges:    $Therapeutic Activity: 23-37 mins PT General Charges $$ ACUTE PT VISIT: 1 Visit                     [name], PT Acute Rehabilitation  Services Office:[phone] 10/23/2023    [name] 10/23/2023, 6:32 PM

## 2023-10-23 NOTE — Progress Notes (Signed)
 NEUROLOGY CONSULT FOLLOW UP NOTE   Date of service: October 23, 2023 Patient Name: Connor Dixon MRN:  [id number] DOB:  26-Mar-1935  Interval Hx/subjective   Some improvement from admission but not at baseline. Will occasionally speak simple words. Does move extremities purposefully but not following commands.   Vitals   Vitals:   10/22/23 1957 10/22/23 2355 10/23/23 0352 10/23/23 0830  BP: (!) 125/49 91/77 (!) 106/53 130/89  Pulse: 89 72 75 78  Resp: 18 18 18    Temp: 98.6 F (37 C) 98.6 F (37 C) 98.7 F (37.1 C) 98.4 F (36.9 C)  TempSrc: Oral Oral Oral Axillary  SpO2: 100% 99% 99% 100%  Weight:         Body mass index is 30.5 kg/m.  Physical Exam   Constitutional: elderly male in NAD  Psych: Affect appropriate to situation.   Eyes: No scleral injection.   HENT: No OP obstrucion.   Head: Normocephalic.   Cardiovascular: Normal rate and regular rhythm.   Respiratory: Effort normal, non-labored breathing.   GI: Soft.  No distension. There is no tenderness.   Skin: WDI.    Neurologic Examination   Ment: Opens eyes, consistently tracks examiner. Says "yes" to all questions. Does not follow simple commands. CN: tracking Blinks to threat bilaterally, facial movement symmetric, no dysarthria, hearing intact to voice, shoulder shrug symmetric, tongue protrusion midline.  Motor/Sensory: He moves all extremities spontaneously. Withdraws in all extremities.  Seems to have a right sided preference but does move left side purposefully.   Medications  Current Facility-Administered Medications:    acetaminophen  (TYLENOL ) tablet 650 mg, 650 mg, Oral, Q4H PRN, 650 mg at 10/13/23 0555 **OR** acetaminophen  (TYLENOL ) 160 MG/5ML solution 650 mg, 650 mg, Per Tube, Q4H PRN **OR** acetaminophen  (TYLENOL ) suppository 650 mg, 650 mg, Rectal, Q4H PRN, Doutova, Anastassia, MD   amLODipine  (NORVASC ) tablet 10 mg, 10 mg, Oral, Daily, Wouk, Haynes Lips, MD, 10 mg at 10/22/23 9562   apixaban   (ELIQUIS ) tablet 2.5 mg, 2.5 mg, Oral, BID, Swayze, Ava, DO, 2.5 mg at 10/22/23 2120   cyanocobalamin  (VITAMIN B12) injection 1,000 mcg, 1,000 mcg, Subcutaneous, Daily, Faith Homes, MD, 1,000 mcg at 10/22/23 1308   folic acid  (FOLVITE ) tablet 1 mg, 1 mg, Oral, Daily, Utomwen, Adesuwa, RPH, 1 mg at 10/22/23 6578   hydrALAZINE  (APRESOLINE ) tablet 10 mg, 10 mg, Oral, Q8H PRN, Swayze, Ava, DO, 10 mg at 10/15/23 1517   insulin  aspart (novoLOG ) injection 0-5 Units, 0-5 Units, Subcutaneous, QHS, Girguis, David, MD   insulin  aspart (novoLOG ) injection 0-9 Units, 0-9 Units, Subcutaneous, TID WC, Faith Homes, MD, 1 Units at 10/22/23 1739   insulin  glargine-yfgn (SEMGLEE ) injection 10 Units, 10 Units, Subcutaneous, Daily, Girguis, David, MD   lactose free nutrition (BOOST PLUS) liquid 237 mL, 237 mL, Oral, BID BM, Wouk, Haynes Lips, MD, 237 mL at 10/22/23 1311   LORazepam  (ATIVAN ) injection 2 mg, 2 mg, Intravenous, Once PRN, Wouk, Haynes Lips, MD   metoprolol  tartrate (LOPRESSOR ) tablet 12.5 mg, 12.5 mg, Oral, BID, Wouk, Haynes Lips, MD, 12.5 mg at 10/22/23 2120   Oral care mouth rinse, 15 mL, Mouth Rinse, PRN, Faith Homes, MD  Labs and Diagnostic Imaging   CBC:  Recent Labs  Lab 10/20/23 0340 10/21/23 0410  WBC 5.2 4.6  NEUTROABS 2.9 2.6  HGB 13.6 13.7  HCT 38.9* 39.9  MCV 88.0 89.1  PLT 119* 118*    Basic Metabolic Panel:  Lab Results  Component Value Date   NA 133 (  L) 10/23/2023   K 4.9 10/23/2023   CO2 22 10/23/2023   GLUCOSE 95 10/23/2023   BUN 22 10/23/2023   CREATININE 1.69 (H) 10/23/2023   CALCIUM 8.3 (L) 10/23/2023   GFRNONAA 39 (L) 10/23/2023   GFRAA 32 (L) 12/10/2019   Lipid Panel:  Lab Results  Component Value Date   LDLCALC 68 10/11/2023   HgbA1c:  Lab Results  Component Value Date   HGBA1C 8.7 (H) 09/06/2023   Urine Drug Screen:     Component Value Date/Time   LABOPIA NONE DETECTED 10/10/2023 1747   COCAINSCRNUR NONE DETECTED 10/10/2023 1747    LABBENZ NONE DETECTED 10/10/2023 1747   AMPHETMU NONE DETECTED 10/10/2023 1747   THCU NONE DETECTED 10/10/2023 1747   LABBARB NONE DETECTED 10/10/2023 1747    Alcohol Level     Component Value Date/Time   ETH <15 10/17/2023 1944   INR  Lab Results  Component Value Date   INR 1.2 10/10/2023   APTT  Lab Results  Component Value Date   APTT 37 (H) 10/10/2023    CT Head without contrast (Personally reviewed): No acute intracranial abnormality. ASPECTS 10   CT angio Head and Neck with contrast(Personally reviewed): No LVO. No evidence of significant stenosis, aneurysmal dilatation, or dissection involving the arteries of the head and neck  Repeat CT Head without contrast 5/28:  No acute intracranial abnormality.Generalized atrophy and findings of chronic microvascular disease.   MRI Brain(Personally reviewed): No acute intracranial abnormality by noncontrast MRI. Signal changes compatible with chronic small vessel disease. Generalized appearing brain volume loss.   MRI brain w/wo:  No acute intracranial abnormality. Moderate atrophy and white matter disease bilaterally. 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  cEEG: 10/19/2023 1036 to 10/22/2023 1036  This study is suggestive of moderate diffuse encephalopathy. No seizures or epileptiform discharges were seen throughout the recording.   Labs  Serum paraneoplastic panel negative  CSF [facility] autoimmune panel pending   Assessment  Connor Dixon is an 89 y.o. male who presented with severe encephalopathy which is currently of unclear etiology.  Initially continued to show slow improvement daily, but now seems to have plateaued. Unclear if this was due to the antibiotics or simply natural history.  No evidence of seizure on prolonged EEG, no evidence of stroke on MRI. DDx includes seizures secondary to septic encephalopathy of unclear source with  response to antibiotics, delirium of unclear etiology, autoimmune encephalopathy with some response seen from IVIG treatment. Continuous EEG was negative, and his exam did not seem very consistent with seizure on arrival. - Exam today is stable.  - Extensive workup for CNS processes has not shown a clear etiology. - IVIG completed 6/4. Benefit from IVIG may be appear/increase up to 2 weeks after 5 day course is completed.  - For now, continue watchful waiting and PT/OT.  - He has low cognitive reserve and recovery, if possible, will take time.  - Serum paraneoplastic panel is negative. We are still awaiting results of Autoimmune labs which were sent to [facility].  - Unless [facility] panel shows (+) autoantibody in CSF we would not recommend further immunotherapy at this time. - D/w family at bedside  Recommendations  - Discontinue LTM - Follow-up autoimmune labs are still pending  - OOB as able - Delirium Precautions - Day/night/sleep interventions - PT/OT   Will continue to follow ______________________________________________________________________  Patient seen and examined by NP/APP.  Imogene Mana, DNP, FNP-BC [facility] Pager: [phone]  Electronically signed: Dr. Teleshia Lemere

## 2023-10-23 NOTE — Plan of Care (Signed)
  Problem: Education: Goal: Ability to describe self-care measures that may prevent or decrease complications (Diabetes Survival Skills Education) will improve 10/23/2023 0635 by Francena Infield, RN Outcome: Progressing 10/23/2023 0519 by Francena Infield, RN Outcome: Progressing Goal: Individualized Educational Video(s) 10/23/2023 0635 by Francena Infield, RN Outcome: Progressing 10/23/2023 0519 by Francena Infield, RN Outcome: Progressing   Problem: Health Behavior/Discharge Planning: Goal: Ability to identify and utilize available resources and services will improve 10/23/2023 0635 by Francena Infield, RN Outcome: Progressing 10/23/2023 0519 by Francena Infield, RN Outcome: Progressing Goal: Ability to manage health-related needs will improve 10/23/2023 0635 by Francena Infield, RN Outcome: Progressing 10/23/2023 0519 by Francena Infield, RN Outcome: Progressing   Problem: Coping: Goal: Will verbalize positive feelings about self 10/23/2023 0635 by Francena Infield, RN Outcome: Progressing 10/23/2023 0519 by Francena Infield, RN Outcome: Progressing Goal: Will identify appropriate support needs 10/23/2023 0635 by Francena Infield, RN Outcome: Progressing 10/23/2023 0519 by Francena Infield, RN Outcome: Progressing   Problem: Clinical Measurements: Goal: Ability to maintain clinical measurements within normal limits will improve 10/23/2023 0635 by Francena Infield, RN Outcome: Progressing 10/23/2023 0519 by Francena Infield, RN Outcome: Progressing Goal: Will remain free from infection 10/23/2023 0635 by Francena Infield, RN Outcome: Progressing 10/23/2023 0519 by Francena Infield, RN Outcome: Progressing Goal: Diagnostic test results will improve 10/23/2023 0635 by Francena Infield, RN Outcome: Progressing 10/23/2023 0519 by Francena Infield, RN Outcome: Progressing Goal: Respiratory  complications will improve 10/23/2023 0635 by Francena Infield, RN Outcome: Progressing 10/23/2023 0519 by Francena Infield, RN Outcome: Progressing Goal: Cardiovascular complication will be avoided 10/23/2023 4010 by Francena Infield, RN Outcome: Progressing 10/23/2023 0519 by Francena Infield, RN Outcome: Progressing

## 2023-10-24 DIAGNOSIS — G9340 Encephalopathy, unspecified: Secondary | ICD-10-CM | POA: Diagnosis not present

## 2023-10-24 LAB — GLUCOSE, CAPILLARY
Glucose-Capillary: 233 mg/dL — ABNORMAL HIGH (ref 70–99)
Glucose-Capillary: 375 mg/dL — ABNORMAL HIGH (ref 70–99)
Glucose-Capillary: 327 mg/dL — ABNORMAL HIGH (ref 70–99)
Glucose-Capillary: 312 mg/dL — ABNORMAL HIGH (ref 70–99)

## 2023-10-24 NOTE — Inpatient Diabetes Management (Signed)
 Inpatient Diabetes Program Recommendations  AACE/ADA: New Consensus Statement on Inpatient Glycemic Control (2015)  Target Ranges:  Prepandial:   less than 140 mg/dL      Peak postprandial:   less than 180 mg/dL (1-2 hours)      Critically ill patients:  140 - 180 mg/dL   Lab Results  Component Value Date   GLUCAP 233 (H) 10/24/2023   HGBA1C 8.7 (H) 09/06/2023    Latest Reference Range & Units 10/23/23 06:16 10/23/23 11:40 10/23/23 16:35 10/23/23 21:18 10/24/23 06:16  Glucose-Capillary 70 - 99 mg/dL 161 (H) 096 (H) 045 (H) 173 (H) 233 (H)  (H): Data is abnormally high Review of Glycemic Control  Diabetes history: DM2 Outpatient Diabetes medications: Humulin  70/30 insulin  40 units in am; 25 units in pm Current orders for Inpatient glycemic control: Semglee  10 units daily, Novolog  0-9 units correction scale TID, Novolog  0-5 units HS scale  Inpatient Diabetes Program Recommendations:   Noted that blood sugars have been greater than 180 mg/dl.  Recommend increasing Semglee  to 15 units daily if blood sugars continue to be elevated.  Nick Barman RN BSN CDE Diabetes Coordinator Pager: [phone]  8am-5pm

## 2023-10-24 NOTE — Progress Notes (Signed)
 Patient ID: Connor Dixon, male   DOB: [birth year], 89 y.o.   MRN: [id number]    Progress Note from the Palliative Medicine Team at [facility]   Patient Name: Connor Dixon        Date: 10/24/2023 DOB: 24-Aug-1934  Age: 89 y.o. MRN#: [id number] Attending Physician: Faith Homes, MD Primary Care Physician: Arcadio Knuckles, MD Admit Date: 10/10/2023   Reason for Consultation/Follow-up   Establishing Goals of Care   HPI/ Brief Hospital Review   89 y.o. male  with past medical history of A-fib (Eliquis ), s/p PPM for second-degree heart block, HTN, CKD (stage III), type 1 diabetes, morbid obesity, and HLD admitted on 10/10/2023 after being found down at home.  Last known well was 5/26 at 1700.   EGD revealed moderate diffuse encephalopathy with no seizures or epileptiform discharges noted.  Overnight EEG results pending.  Pending.  Workup remains in progress.  CT of the head revealed remote lacunar infarct in the anterior right corona radiata.  CTA revealed no evidence of significant stenosis, aneurysmal dilatation, or dissection involving the arteries of the head and neck. MRI WNL. Ongoing work-up, labs pending   PMT was consulted to support patient and family with goals of care discussions.   Subjective  Extensive chart review has been completed prior to meeting with patient/family  including labs, vital signs, imaging, progress/consult notes, orders, medications and available advance directive documents.    This NP assessed patient at the bedside as a follow up for palliative medicine needs and emotional support.  I discussed the patient with attending/Dr. Dolan Freiberg.    Patient's speech is garbled and unable to follow commands currently  I placed a phone call to patient's son/Connor Dixon to offer emotional support and to answer questions and address concerns.  Family currently are open to all offered and available medical interventions to prolong life, they remain hopeful for  improvement.  Family has no questions or concerns at this time.  I encouraged family to call PMT with any need, family have our contact number.  Education offered today regarding  the importance of continued conversation with family and the  medical providers regarding overall plan of care and treatment options,  ensuring decisions are within the context of the patients values and GOCs.  Questions and concerns addressed   PMT will continue to shadow for needs   Time:  25  minutes  Detailed review of medical records ( labs, imaging, vital signs), medically appropriate exam ( MS, skin, cardiac,  resp)   discussed with treatment team, counseling and education to patient, family, staff, documenting clinical information, medication management, coordination of care    Thena Fireman NP  Palliative Medicine Team Team Phone # [phone] Pager [phone]

## 2023-10-24 NOTE — Progress Notes (Signed)
 Progress Note    Connor Dixon   JXB:[id number]  DOB: [birth year]  DOA: 10/10/2023     14 PCP: Connor Knuckles, MD  Initial CC: Found down at home  Hospital Course: Connor Dixon is an 89 yo male with PMH chronic diastolic CHF, complete AV block s/p pacemaker, CKD 3, diabetes, HTN, HLD who presented after being found slumped over in his chair at home and unresponsive. He was spoken to on the phone on the evening of 10/09/2023 and upon being found on 10/10/2023 by the cleaner he was noted to be unresponsive slumped over the chair. At baseline he is considered high functioning and ambulates independently and lives alone.  He was admitted for further workup of altered mentation.   In the ED the patient underwent a CT head that demonstrated no acute process. A CTA head and neck demonstrated no LVO. There was however evidence of a remote lacunar infarct of the anterior right corona radiata. There was concern for Seizure, Posterior Circulation CVA, and meningitis. The patient will need to be off of anticoagulation for at least 48 hours. LP is planned for 10/12/2023. The patient will be started on empiric antibiotic coverage for meningitis.    MRI was performed and demonstrated no acute intracranial abnormality. There was evidence for chronic small vessel disease. There was also generalized appearing brain volume loss. EEG unremarkable. Neurology has evaluated the patient. Plan is for LP on the afternoon of 10/12/2023.    On 10/13/2023 the patient again appears completely obtunded. This may be due to ativan  given for MRI on 10/12/2023, but in any case the patient is not as alert as he was on 10/12/2023.    MRI brain has ruled out CVA.   LP performed on 10/13/2023 has ruled out meningitis. Have added on VDRL of CSF onto studies for the CSF. Antibiotics for meningitis stopped as per neurology. However, steroids continued in case there is an autoimmune component to the patient's illness.  Steroid was  discontinued on 10/14/2023.   10/14/2023 the patient is much improved according to the family at bedside. The patient has cracked a joke with the phlebotomist, eaten some breakfast, and been able to open his phone with his password. He has spoken.   In the following days, he again regressed and could not communicate.  He would only babble or say yes/no inappropriately.  He would also not be able to follow commands at all.  He eats if fed most of the time.    Assessment and Plan:    Acute encephalopathy - MRI neg for stroke on 5/29.  - EEG no seizure like activity even on continuous EEG over several days - LP no meningitis. 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  Mostly babbles throughout the day and says yes/no inappropriately; cannot get him to follow commands. If no major improvement after IVIG the other theory might have to be some level of dementia that was already present and worsened with the acute illness that led to hospitalization - given extensive  workup that's been negative; remaining realistic differentials still include something such as a rapid neurocognitive decline from frontotemporal dementia vs global cognitive decline (neurodegeneration) vs vascular dementia. But I believe at this point it seems to be neurologic in origin and was rapid - at this point, after discussion with family also; patient simply cannot safely be discharged to SNF until he could at least consistently follow commands and/or  use a call/safety bell if necessary and/or consistently engage with PT/OT. He cannot do any of this and sending to rehab would be futile and likely more harm than good. Furthermore, family awaiting last test result from 10/14/23, (the AI enceph panel on CSF sent to [facility] clinic) which may also take several more days or weeks, but regardless, it's futile to send patient anywhere in this current state (seemingly all he is capable of doing is laying in bed and being fed). Informed neurology to also continue watching lab to result in order to discuss findings with patient's son in order to help family make more decisions on next steps as well  Suspected cognitive impairment, POA - see above as well - patient is described as having some level of decreasing cognition over at least weeks to months prior to admission; more forgetfulness with directions, cooking/prepping meals, finances, and not driving lawn mower well - quite possible as noted above that underlying cognitive impairment became acutely worsened in the setting of this acute illness - might need more time to acclimate and return to close to baseline but only time will tell at this rate; this could very well be a progressive rapid decline without improvement   Acute on CKD3b - varying ranges but baselines seem to be creat ~ 1.5 -1.6 and GFR 37-40 - BUN has normalized and creat back to it's baseline  Insulin -requiring or dependent type II diabetes mellitus (HCC) - Last A1c 8.7% on 09/06/2023 - Some intermittent hypoglycemia.  Regimen adjusted Continue basal/bolus/ssi  Essential hypertension - started amlodipine  (home losart and coreg  are on hold)   Elevated serum osm gap Etiology unclear. Not acidotic. Family thinks toxic alcohol ingestion unlikely. Discussed with Connor Dixon of hematology, would be abnormal for paraproteinemia to present with purely encephalopathy, he advises following labs ordered. - IgG normal/elevated; no free kappa chains;  no M-spike   Elevated troponin Appears to be demand - Echo unrmarkable   Acute urinary retention - resolved -Foley discontinued 6/2   Hyperlipidemia with target LDL less than 70 Not able to tolerate PO statin at this time   Paroxysmal atrial fibrillation (HCC) - Continue Eliquis  and Lopressor    Sinus node dysfunction (HCC) - s/p pacemaker   Prolonged QT interval - will monitor on tele avoid QT prolonging medications, rehydrate correct electrolytes  Interval History:  Mostly unintelligable speech, but does manage to say a few words but they are never in context of the question being asked. Still cannot follow any commands no matter how simple (e.g. raise arm, open mouth, stick out tongue).  Plan remains to keep patient in-patient until [facility] send out lab returns and neuro can review and talk to family and patient needs to also be able to meaningfully interact with PT/OT and be able to show rehabable potential.    Old records reviewed in assessment of this patient  Antimicrobials:  DVT prophylaxis:  apixaban  (ELIQUIS ) tablet 2.5 mg Start: 10/15/23 1430 SCDs Start: 10/15/23 1335 SCD's Start: 10/10/23 2338 apixaban  (ELIQUIS ) tablet 2.5 mg   Code Status:   Code Status: Limited: Do not  attempt resuscitation (DNR) -DNR-LIMITED -Do Not Intubate/DNI   Mobility Assessment (Last 72 Hours)     Mobility Assessment     Row Name 10/24/23 0930 10/24/23 0607 10/24/23 0330 10/24/23 0216 10/23/23 2358   Does patient have an order for bedrest or is patient medically unstable No - Continue assessment No - Continue assessment No - Continue assessment No - Continue assessment No - Continue assessment   What is the highest level of mobility based on the progressive mobility assessment? Level 1 (Bedfast) - Unable to balance while sitting on edge of bed Level 1 (Bedfast) - Unable to balance while sitting on edge of bed Level 1 (Bedfast) - Unable to balance while sitting on edge of bed Level 1  (Bedfast) - Unable to balance while sitting on edge of bed Level 1 (Bedfast) - Unable to balance while sitting on edge of bed   Is the above level different from baseline mobility prior to current illness? Yes - Recommend PT order Yes - Recommend PT order Yes - Recommend PT order Yes - Recommend PT order Yes - Recommend PT order    Row Name 10/23/23 2125 10/23/23 2000 10/23/23 1930 10/23/23 1830 10/23/23 0800   Does patient have an order for bedrest or is patient medically unstable No - Continue assessment No - Continue assessment No - Continue assessment -- No - Continue assessment   What is the highest level of mobility based on the progressive mobility assessment? Level 1 (Bedfast) - Unable to balance while sitting on edge of bed Level 1 (Bedfast) - Unable to balance while sitting on edge of bed Level 1 (Bedfast) - Unable to balance while sitting on edge of bed Level 1 (Bedfast) - Unable to balance while sitting on edge of bed Level 1 (Bedfast) - Unable to balance while sitting on edge of bed   Is the above level different from baseline mobility prior to current illness? Yes - Recommend PT order Yes - Recommend PT order Yes - Recommend PT order -- Yes - Recommend PT order    Row Name 10/23/23 0545 10/23/23 0443 10/23/23 0200 10/22/23 2121 10/22/23 2000   Does patient have an order for bedrest or is patient medically unstable No - Continue assessment No - Continue assessment No - Continue assessment No - Continue assessment No - Continue assessment   What is the highest level of mobility based on the progressive mobility assessment? Level 1 (Bedfast) - Unable to balance while sitting on edge of bed Level 1 (Bedfast) - Unable to balance while sitting on edge of bed Level 1 (Bedfast) - Unable to balance while sitting on edge of bed Level 1 (Bedfast) - Unable to balance while sitting on edge of bed Level 1 (Bedfast) - Unable to balance while sitting on edge of bed   Is the above level different from  baseline mobility prior to current illness? Yes - Recommend PT order Yes - Recommend PT order Yes - Recommend PT order Yes - Recommend PT order Yes - Recommend PT order    Row Name 10/22/23 1600 10/22/23 1215 10/22/23 0837 10/21/23 2000 10/21/23 1600   Does patient have an order for bedrest or is patient medically unstable No - Continue assessment No - Continue assessment No - Continue assessment No - Continue assessment No - Continue assessment   What is the highest level of mobility based on the progressive mobility assessment? Level 1 (Bedfast) - Unable to balance while sitting on edge of bed Level 1 (Bedfast) -  Unable to balance while sitting on edge of bed Level 1 (Bedfast) - Unable to balance while sitting on edge of bed Level 1 (Bedfast) - Unable to balance while sitting on edge of bed Level 1 (Bedfast) - Unable to balance while sitting on edge of bed   Is the above level different from baseline mobility prior to current illness? Yes - Recommend PT order Yes - Recommend PT order Yes - Recommend PT order Yes - Recommend PT order Yes - Recommend PT order    Row Name 10/21/23 1230           Does patient have an order for bedrest or is patient medically unstable No - Continue assessment       What is the highest level of mobility based on the progressive mobility assessment? Level 1 (Bedfast) - Unable to balance while sitting on edge of bed       Is the above level different from baseline mobility prior to current illness? Yes - Recommend PT order                Barriers to discharge:  Disposition Plan: SNF eventually if able HH orders placed: TBD Status is: Inpt  Objective: Blood pressure 136/87, pulse 97, temperature 98.6 F (37 C), temperature source Oral, resp. rate 20, weight 91 kg, SpO2 98%.  Examination:  Physical Exam Constitutional:      Comments: Awake and resting in bed in no distress but not able to interact. Cannot follow commands  HENT:     Head: Normocephalic and  atraumatic.     Mouth/Throat:     Mouth: Mucous membranes are moist.  Eyes:     Extraocular Movements: Extraocular movements intact.  Cardiovascular:     Rate and Rhythm: Normal rate and regular rhythm.  Pulmonary:     Effort: Pulmonary effort is normal. No respiratory distress.     Breath sounds: Normal breath sounds. No wheezing.  Abdominal:     General: Bowel sounds are normal. There is no distension.     Palpations: Abdomen is soft.     Tenderness: There is no abdominal tenderness.  Musculoskeletal:        General: Normal range of motion.     Cervical back: Normal range of motion and neck supple.  Skin:    General: Skin is warm and dry.  Neurological:     Comments: Mostly answers yes to questions but inappropriately.  Withdraws to pain in all 4 extremities appropriately      Consultants:  Neurology  Procedures:    Data Reviewed: Results for orders placed or performed during the hospital encounter of 10/10/23 (from the past 24 hours)  Glucose, capillary     Status: Abnormal   Collection Time: 10/23/23 11:40 AM  Result Value Ref Range   Glucose-Capillary 324 (H) 70 - 99 mg/dL  Glucose, capillary     Status: Abnormal   Collection Time: 10/23/23  4:35 PM  Result Value Ref Range   Glucose-Capillary 364 (H) 70 - 99 mg/dL  Glucose, capillary     Status: Abnormal   Collection Time: 10/23/23  9:18 PM  Result Value Ref Range   Glucose-Capillary 173 (H) 70 - 99 mg/dL  Glucose, capillary     Status: Abnormal   Collection Time: 10/24/23  6:16 AM  Result Value Ref Range   Glucose-Capillary 233 (H) 70 - 99 mg/dL    I have reviewed pertinent nursing notes, vitals, labs, and images as necessary. I have ordered  labwork to follow up on as indicated.  I have reviewed the last notes from staff over past 24 hours. I have discussed patient's care plan and test results with nursing staff, CM/SW, and other staff as appropriate.  Time spent: Greater than 50% of the 55 minute visit was  spent in counseling/coordination of care for the patient as laid out in the A&P.   LOS: 14 days   Faith Homes, MD [facility] 10/24/2023, 11:34 AM

## 2023-10-24 NOTE — Progress Notes (Signed)
 Speech Language Pathology Treatment: Dysphagia  Patient Details Name: Connor Dixon MRN: [id number] DOB: 11/16/1934 Today's Date: 10/24/2023 Time: 4098-1191 SLP Time Calculation (min) (ACUTE ONLY): 25 min  Assessment / Plan / Recommendation Clinical Impression  Pt seen with trial Dys 2 tray for possible upgrade with son and daughter-in-law present. Pt needs total feeding assist and was somewhat distracted, trying to put finger in plate. He exhibited prolonged mastication but was able to adequately transit given additional time and bringing straw to lips at times helped to initiate swallow. There were small pieces on tongue after initial swallow that he would continue to chew intermittently but liquid wash helped to clear. No s/s aspiration present during session. SLP will upgrade to Dys 2, continue thin. Educated family to use straw to lips to help initiate swallow, liquid to aid in removal of residue and check for pocketing at end of meals. SLP provided them with swabs to use to clear if needed.    HPI HPI: Pt is 89 year old [facility]  10[facility]23 after found at home unresponsive. Pt with dense encephalopathy with flexor positioning of UE's. CT negative for acute changes and MRI reports "No acute intracranial abnormality by noncontrast MRI.  2. Signal changes compatible with chronic small vessel disease. Generalized appearing brain volume loss."  PMH - afib on Eliquis , pacer, HTN, CKD, DM, obesity      SLP Plan  Continue with current plan of care          Recommendations  Diet recommendations: Dysphagia 2 (fine chop);Thin liquid Liquids provided via: Straw Medication Administration: Crushed with puree Supervision: Full supervision/cueing for compensatory strategies;Trained caregiver to feed patient Compensations: Minimize environmental distractions;Slow rate;Small sips/bites;Lingual sweep for clearance of pocketing Postural Changes and/or Swallow Maneuvers: Seated upright 90  degrees                  Oral care BID   Frequent or constant Supervision/Assistance Dysphagia, unspecified (R13.10);Aphasia (R47.01);Cognitive communication deficit (Y78.295)     Continue with current plan of care     [name]  10/24/2023, 12:16 PM

## 2023-10-24 NOTE — Plan of Care (Signed)
  Problem: Nutritional: Goal: Maintenance of adequate nutrition will improve Outcome: Progressing Goal: Progress toward achieving an optimal weight will improve Outcome: Progressing   Problem: Skin Integrity: Goal: Risk for impaired skin integrity will decrease Outcome: Progressing   Problem: Clinical Measurements: Goal: Ability to maintain clinical measurements within normal limits will improve Outcome: Progressing Goal: Will remain free from infection Outcome: Progressing Goal: Diagnostic test results will improve Outcome: Progressing Goal: Respiratory complications will improve Outcome: Progressing Goal: Cardiovascular complication will be avoided Outcome: Progressing   Problem: Elimination: Goal: Will not experience complications related to bowel motility Outcome: Progressing Goal: Will not experience complications related to urinary retention Outcome: Progressing   Problem: Safety: Goal: Ability to remain free from injury will improve Outcome: Progressing   Problem: Skin Integrity: Goal: Risk for impaired skin integrity will decrease Outcome: Progressing   Problem: Pain Managment: Goal: General experience of comfort will improve and/or be controlled Outcome: Progressing

## 2023-10-25 DIAGNOSIS — G3183 Dementia with Lewy bodies: Secondary | ICD-10-CM

## 2023-10-25 DIAGNOSIS — F0280 Dementia in other diseases classified elsewhere without behavioral disturbance: Secondary | ICD-10-CM | POA: Diagnosis not present

## 2023-10-25 DIAGNOSIS — G9340 Encephalopathy, unspecified: Secondary | ICD-10-CM | POA: Diagnosis not present

## 2023-10-25 LAB — CBC
HCT: 38.9 % — ABNORMAL LOW (ref 39.0–52.0)
Hemoglobin: 13.2 g/dL (ref 13.0–17.0)
MCH: 30.3 pg (ref 26.0–34.0)
MCHC: 33.9 g/dL (ref 30.0–36.0)
MCV: 89.4 fL (ref 80.0–100.0)
Platelets: 167 10*3/uL (ref 150–400)
RBC: 4.35 MIL/uL (ref 4.22–5.81)
RDW: 14.7 % (ref 11.5–15.5)
WBC: 5.4 10*3/uL (ref 4.0–10.5)
nRBC: 0 % (ref 0.0–0.2)

## 2023-10-25 LAB — BASIC METABOLIC PANEL WITH GFR
Anion gap: 6 (ref 5–15)
BUN: 33 mg/dL — ABNORMAL HIGH (ref 8–23)
CO2: 24 mmol/L (ref 22–32)
Calcium: 8.6 mg/dL — ABNORMAL LOW (ref 8.9–10.3)
Chloride: 101 mmol/L (ref 98–111)
Creatinine, Ser: 1.98 mg/dL — ABNORMAL HIGH (ref 0.61–1.24)
GFR, Estimated: 32 mL/min — ABNORMAL LOW (ref >60)
Glucose, Bld: 276 mg/dL — ABNORMAL HIGH (ref 70–99)
Potassium: 4.5 mmol/L (ref 3.5–5.1)
Sodium: 131 mmol/L — ABNORMAL LOW (ref 135–145)

## 2023-10-25 LAB — GLUCOSE, CAPILLARY
Glucose-Capillary: 299 mg/dL — ABNORMAL HIGH (ref 70–99)
Glucose-Capillary: 330 mg/dL — ABNORMAL HIGH (ref 70–99)
Glucose-Capillary: 204 mg/dL — ABNORMAL HIGH (ref 70–99)
Glucose-Capillary: 159 mg/dL — ABNORMAL HIGH (ref 70–99)

## 2023-10-25 MED ORDER — INSULIN GLARGINE-YFGN 100 UNIT/ML ~~LOC~~ SOLN
14.0000 [IU] | Freq: Every day | SUBCUTANEOUS | Status: DC
  Administered 2023-10-26 – 2023-11-08 (×14): 14 [IU] via SUBCUTANEOUS
  Filled 2023-10-25 (×14): qty 0.14

## 2023-10-25 MED ORDER — INSULIN GLARGINE-YFGN 100 UNIT/ML ~~LOC~~ SOLN: 15.0000 [IU] | Freq: Every day | SUBCUTANEOUS | Status: DC

## 2023-10-25 MED ORDER — BACITRACIN ZINC 500 UNIT/GM EX OINT
TOPICAL_OINTMENT | Freq: Two times a day (BID) | CUTANEOUS | Status: DC
  Administered 2023-10-25 – 2023-11-07 (×26): 31.5 via TOPICAL
  Filled 2023-10-25: qty 28.4

## 2023-10-25 MED ORDER — SODIUM CHLORIDE 0.9 % IV SOLN: INTRAVENOUS | Status: DC

## 2023-10-25 NOTE — Progress Notes (Addendum)
 NEUROLOGY CONSULT FOLLOW UP NOTE   Date of service: October 25, 2023 Patient Name: Connor Dixon MRN:  [id number] DOB:  Oct 11, 1934  Interval Hx/subjective   Some improvement from admission but not at baseline. Will occasionally speak simple words. Does move extremities purposefully but not following commands. More drowsy today. PT at the bedside to work with him.  Vitals   Vitals:   10/24/23 1937 10/24/23 2322 10/25/23 0406 10/25/23 0731  BP: (!) 97/49 (!) 107/54 (!) 100/51 (!) 127/97  Pulse: 88 79 89 (!) 106  Resp: 18 18 18 19   Temp: 99.3 F (37.4 C) 99.2 F (37.3 C) 99.3 F (37.4 C) 99.3 F (37.4 C)  TempSrc: Oral Oral Oral Oral  SpO2: 97% 98% 98% 98%  Weight:         Body mass index is 30.5 kg/m.  Physical Exam   Constitutional: elderly male in NAD  Psych: Affect appropriate to situation.   Eyes: No scleral injection.   HENT: No OP obstrucion.   Head: Normocephalic.   Cardiovascular: Normal rate and regular rhythm.   Respiratory: Effort normal, non-labored breathing.   GI: Soft.  No distension. There is no tenderness.   Skin: WDI.    Neurologic Examination   Ment: Drowsy, needs frequent stimulation to open eyes but still consistently tracks examiner. Says yes to all questions. Does not follow simple commands. Swore and glanced at examiner with a frown when examiner attempted to passively move hips, but patient would not say if it hurt, or where, when asked.  CN: Tracking examiner visually. Blinks to threat bilaterally, facial movement symmetric, no dysarthria, hearing intact to voice, shoulder shrug symmetric, tongue protrusion midline.  Motor/Sensory: He moves all extremities spontaneously. Withdraws in all extremities. Moderately increased tone in BUE with subtle cogwheel rigidity. Mild to moderately increased tone in BLE.  Seems to have a right sided preference but does move left side purposefully.   Medications  Current Facility-Administered Medications:     acetaminophen  (TYLENOL ) tablet 650 mg, 650 mg, Oral, Q4H PRN, 650 mg at 10/13/23 0555 **OR** acetaminophen  (TYLENOL ) 160 MG/5ML solution 650 mg, 650 mg, Per Tube, Q4H PRN **OR** acetaminophen  (TYLENOL ) suppository 650 mg, 650 mg, Rectal, Q4H PRN, Doutova, Anastassia, MD   amLODipine  (NORVASC ) tablet 10 mg, 10 mg, Oral, Daily, Wouk, Haynes Lips, MD, 10 mg at 10/25/23 2841   apixaban  (ELIQUIS ) tablet 2.5 mg, 2.5 mg, Oral, BID, Swayze, Ava, DO, 2.5 mg at 10/25/23 0839   artificial tears ophthalmic solution 1 drop, 1 drop, Both Eyes, PRN, Dela Favor, Devon, NP, 1 drop at 10/24/23 1001   cyanocobalamin  (VITAMIN B12) injection 1,000 mcg, 1,000 mcg, Subcutaneous, Daily, Girguis, David, MD, 1,000 mcg at 10/25/23 0839   folic acid  (FOLVITE ) tablet 1 mg, 1 mg, Oral, Daily, Utomwen, Adesuwa, RPH, 1 mg at 10/25/23 0839   hydrALAZINE  (APRESOLINE ) tablet 10 mg, 10 mg, Oral, Q8H PRN, Swayze, Ava, DO, 10 mg at 10/15/23 1517   insulin  aspart (novoLOG ) injection 0-5 Units, 0-5 Units, Subcutaneous, QHS, Girguis, David, MD, 4 Units at 10/24/23 2129   insulin  aspart (novoLOG ) injection 0-9 Units, 0-9 Units, Subcutaneous, TID WC, Girguis, David, MD, 5 Units at 10/25/23 3244   insulin  glargine-yfgn (SEMGLEE ) injection 10 Units, 10 Units, Subcutaneous, Daily, Girguis, David, MD, 10 Units at 10/25/23 0840   lactose free nutrition (BOOST PLUS) liquid 237 mL, 237 mL, Oral, BID BM, Wouk, Haynes Lips, MD, 237 mL at 10/25/23 0840   LORazepam  (ATIVAN ) injection 2 mg, 2 mg, Intravenous, Once PRN,  Wouk, Haynes Lips, MD   metoprolol  tartrate (LOPRESSOR ) tablet 12.5 mg, 12.5 mg, Oral, BID, Wouk, Haynes Lips, MD, 12.5 mg at 10/25/23 0839   Oral care mouth rinse, 15 mL, Mouth Rinse, PRN, Faith Homes, MD  Labs and Diagnostic Imaging   CBC:  Recent Labs  Lab 10/20/23 0340 10/21/23 0410  WBC 5.2 4.6  NEUTROABS 2.9 2.6  HGB 13.6 13.7  HCT 38.9* 39.9  MCV 88.0 89.1  PLT 119* 118*    Basic Metabolic Panel:  Lab Results   Component Value Date   NA 133 (L) 10/23/2023   K 4.9 10/23/2023   CO2 22 10/23/2023   GLUCOSE 95 10/23/2023   BUN 22 10/23/2023   CREATININE 1.69 (H) 10/23/2023   CALCIUM 8.3 (L) 10/23/2023   GFRNONAA 39 (L) 10/23/2023   GFRAA 32 (L) 12/10/2019   Lipid Panel:  Lab Results  Component Value Date   LDLCALC 68 10/11/2023   HgbA1c:  Lab Results  Component Value Date   HGBA1C 8.7 (H) 09/06/2023   Urine Drug Screen:     Component Value Date/Time   LABOPIA NONE DETECTED 10/10/2023 1747   COCAINSCRNUR NONE DETECTED 10/10/2023 1747   LABBENZ NONE DETECTED 10/10/2023 1747   AMPHETMU NONE DETECTED 10/10/2023 1747   THCU NONE DETECTED 10/10/2023 1747   LABBARB NONE DETECTED 10/10/2023 1747    Alcohol Level     Component Value Date/Time   ETH <15 10/17/2023 1944   INR  Lab Results  Component Value Date   INR 1.2 10/10/2023   APTT  Lab Results  Component Value Date   APTT 37 (H) 10/10/2023    CT Head without contrast (Personally reviewed): No acute intracranial abnormality. ASPECTS 10   CT angio Head and Neck with contrast(Personally reviewed): No LVO. No evidence of significant stenosis, aneurysmal dilatation, or dissection involving the arteries of the head and neck  Repeat CT Head without contrast 5/28:  No acute intracranial abnormality.Generalized atrophy and findings of chronic microvascular disease.   MRI Brain(Personally reviewed): No acute intracranial abnormality by noncontrast MRI. Signal changes compatible with chronic small vessel disease. Generalized appearing brain volume loss.   MRI brain w/wo:  No acute intracranial abnormality. Moderate atrophy and white matter disease bilaterally. 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  cEEG: 10/19/2023 1036 to 10/22/2023 1036  This study is suggestive of moderate diffuse encephalopathy. No seizures or epileptiform discharges were seen  throughout the recording.   Labs  CSF [facility] autoimmune panel negative   Assessment  Connor Dixon is an 89 y.o. male who presented with severe encephalopathy which is currently of unclear etiology.  Initially continued to show slow improvement daily, but now seems to have plateaued. Unclear if this was due to the antibiotics or simply natural history.  No evidence of seizure on prolonged EEG, no evidence of stroke on MRI. DDx includes seizures secondary to septic encephalopathy of unclear source with response to antibiotics, delirium of unclear etiology, autoimmune encephalopathy with some response seen from IVIG treatment. Continuous EEG was negative, and his exam did not seem very consistent with seizure on arrival. His rigidity on exam, together with his abrupt cognitive decline, is compatible with possible Lewy body dementia.  - Exam today is stable.  - Extensive workup for CNS processes has not shown a clear etiology.  - LP was unremarkable.  - MRI brain showed no stroke.  - Serial EEGs showed slowing,  but no electrographic seizures or focal epileptiform discharges were seen.  - IVIG completed 6/4. May have had slight benefit from this, but the mild improvement that was noted is felt more likely to have been spontaneous given negative paraneoplastic panel and no evidence for CNS inflammation on MRI or in CSF.  - B12 level is low at 270. He has been started on supplementation. This is not felt to be the etiology for his cognitive decline.  - For now, continue watchful waiting and PT/OT.  - He has low cognitive reserve and recovery, if possible, will take time. Diffuse cerebral and hippocampal atrophy seen on MRI provide structural evidence for a low cognitive reserve.  - [facility] panel is negative for any autoantibodies in CSF. Therefore, we would not recommend further immunotherapy at this time. - Given the unrevealing work up, an underlying dementia may explain his lack of improvement back  to baseline from what is, based on the negative work up, an unknown precipitating condition that resulted in his acute decompensation at home. The patient's waxing and waning mentation here with an overall trend towards slight improvement since admission, suggests a possible overlapping hypoactive delirium. Given the patient's rigidity on exam today, Lewy body dementia is now higher on the DDx.    Recommendations  - OOB as able - Delirium Precautions - Day/night/sleep interventions - PT/OT  - Avoid antipsychotic medications.  - May need to be discharged to a SNF. Palliative is consulting. Family previously has indicated that he continue to be DNR, with limited, full scope of care otherwise. They have stated that they do not want a feeding tube should the need arise.  - He may benefit from a second opinion regarding possible Lewy body dementia at the [facility] clinic. I have provided his son with the telephone number to the clinic.  - I have spoken with his son Connor Dixon by telephone this evening. I have gone over the results of his lab testing, imaging studies and serial EEGs. I have discussed the prognosis, which includes probable need for discharge to a SNF. Connor expressed understanding and stated that he will discuss with family the SNF option prior to making a decision.   ______________________________________________________________________  Patient seen and examined by NP/APP.   Connor Mana, DNP, FNP-BC [facility] Pager: [phone]  Electronically signed: Dr. Ronika Kelson

## 2023-10-25 NOTE — Progress Notes (Addendum)
 Initial Nutrition Assessment  DOCUMENTATION CODES:  Not applicable  INTERVENTION:  Continue current diet as ordered Feeding assistance Automatic trays Boost Plus BID to provide 350 kcal and 14g protein Continue kcal count x 24 hours  NUTRITION DIAGNOSIS:  Inadequate oral intake related to lethargy/confusion as evidenced by per patient/family report.  GOAL:  Patient will meet greater than or equal to 90% of their needs  MONITOR:  PO intake, Supplement acceptance, Labs, Weight trends  REASON FOR ASSESSMENT:  Consult Calorie Count  ASSESSMENT:  Pt with hx of HTN, HLD, DM, CHF, CKD, and PAF presented to ED with AMS  Pt underwent multiple CT and MRI with no acute findings. Also underwent LP which was negative.   Pt resting in bed at the time of assessment, not very interactive. Brother at bedside provides some hx. States that pt has not been very interactive at all this admission, has only been able to get him to say a few words to him.   States that prior to admission, appetite was good. Also was able to get around pretty well but noticed he had slowed down some. Pt does have some muscle and fat deficits present, but feel they are likely more in line with aging. Kcal count started by PMT care.   Results are below: 6/10 Breakfast: 227 kcal, 10g protein 6/10 Lunch: 352 kcal, 24g protein 6/10 Dinner: 200 kcal, 16g protein Total: 779 kcal (49% of needs), 50g protein (63% of needs)  6/11 Lunch: 335 kcal, 23g protein Supplements: Boost Plus: consumed on lunch tray, 350kcal, 14g protein  Pt has been accepting of supplements per MAR, likelt intake is higher than it appears as only the boost that was seen on tray was included above. Do not feel that nutrition will be a significant barrier to discharge.  Admit / Current weight: 91 kg   Average Meal Intake: 6/3-6/10: 74% intake x 8 recorded meals  Nutritionally Relevant Medications: Scheduled Meds:  cyanocobalamin   1,000 mcg  Subcutaneous Daily   folic acid   1 mg Oral Daily   insulin  aspart  0-5 Units Subcutaneous QHS   insulin  aspart  0-9 Units Subcutaneous TID WC   insulin  glargine-yfgn  10 Units Subcutaneous Daily   Boost Plus  237 mL Oral BID BM   Labs Reviewed: CBG ranges from 233-375 mg/dL over the last 24 hours HgbA1c 8.7% (4/23)  NUTRITION - FOCUSED PHYSICAL EXAM: Flowsheet Row Most Recent Value  Orbital Region Mild depletion  Upper Arm Region Mild depletion  Thoracic and Lumbar Region No depletion  Buccal Region Mild depletion  Temple Region Mild depletion  Clavicle Bone Region No depletion  Clavicle and Acromion Bone Region No depletion  Scapular Bone Region No depletion  Dorsal Hand Severe depletion  Patellar Region Moderate depletion  Anterior Thigh Region Moderate depletion  Posterior Calf Region Severe depletion  Edema (RD Assessment) None  Hair Reviewed  Eyes Reviewed  Mouth Reviewed  Skin Reviewed  Nails Reviewed       Diet Order:   Diet Order             DIET - DYS 1 Room service appropriate? No; Fluid consistency: Thin  Diet effective now                   EDUCATION NEEDS:  Not appropriate for education at this time  Skin:  Skin Assessment: Reviewed RN Assessment  Last BM:  6/10 - type 6  Height:  Ht Readings from Last 1 Encounters:  10/25/23 5' 7 (1.702 m)    Weight:  Wt Readings from Last 1 Encounters:  10/10/23 91 kg    Ideal Body Weight:  70 kg  BMI:  Body mass index is 31.42 kg/m.  Estimated Nutritional Needs:  Kcal:  1600-1800 kcal/d Protein:  80-95g/d Fluid:  1.8L/d    Edwena Graham, RD, LDN Registered Dietitian II Please reach out via secure chat

## 2023-10-25 NOTE — Progress Notes (Addendum)
 Speech Language Pathology  Patient Details Name: Connor Dixon MRN: [id number] DOB: [birth year] Today's Date: 10/25/2023 Time:  -     MD stopped SLP saying pt was pocketing breakfast. SLP stopped by room and son stated he had significant pocketing and prolonged mastication yesterday and this morning and was agreed upon to downgrade back to puree (Dys 1) texture.                          [name]  10/25/2023, 10:50 AM

## 2023-10-25 NOTE — Plan of Care (Signed)
  Problem: Coping: Goal: Ability to adjust to condition or change in health will improve Outcome: Not Progressing

## 2023-10-25 NOTE — Progress Notes (Signed)
 Occupational Therapy Treatment Patient Details Name: Connor Dixon MRN: [id number] DOB: 1934/07/02 Today's Date: 10/25/2023   History of present illness Pt is 89 year old presented to [facility] on  10/10/23 after found at home unresponsive. Pt with dense encephalopathy with flexor positioning of UE's. LP performed on 10/13/2023 has ruled out meningitis. CT and MRI on 5/29 were negative for acute intercranial abnormality.  PMH - afib on Eliquis , pacer, HTN, CKD, DM, obesity   OT comments  Pt with similar presentation compared to previous sessions, with encouragement he was able to begin using cup and straw to take sips but needs assist to position drink in his hand. Pt not initiating commands for grooming ADLs while sitting in recliner despite gestural and visual cues. Pt was able to state the names of his brothers with accuracy, he remains max A to total A for ADLs, total A to transfer. OT to continue to progress pt as able. Patient will benefit from continued inpatient follow up therapy, <3 hours/day       If plan is discharge home, recommend the following:  Two people to help with walking and/or transfers;Two people to help with bathing/dressing/bathroom;Direct supervision/assist for medications management;Direct supervision/assist for financial management;Supervision due to cognitive status;Assist for transportation;Assistance with cooking/housework;Help with stairs or ramp for entrance   Equipment Recommendations  Other (comment) (defer to next venue of care)    Recommendations for Other Services      Precautions / Restrictions Precautions Precautions: Fall Recall of Precautions/Restrictions: Impaired Restrictions Weight Bearing Restrictions Per Provider Order: No       Mobility Bed Mobility Overal bed mobility: Needs Assistance Bed Mobility: Sit to Supine       Sit to supine: Max assist, +2 for physical assistance   General bed mobility comments: Max A +2 to control trunk and BLEs  with return to supine.    Transfers Overall transfer level: Needs assistance Equipment used: None Transfers: Sit to/from Stand, Bed to chair/wheelchair/BSC Sit to Stand: Max assist, +2 physical assistance, From elevated surface Stand pivot transfers: Total assist Squat pivot transfers: Total assist       General transfer comment: used rocking momentum to generate more force, Pt son helping by placing the pt's arm around therapists shoulders. Pt without initiation of mobility or anterior weight shifts during rocking.     Balance Overall balance assessment: Needs assistance Sitting-balance support: Feet supported Sitting balance-Leahy Scale: Zero   Postural control: Right lateral lean, Posterior lean Standing balance support: Bilateral upper extremity supported Standing balance-Leahy Scale: Zero Standing balance comment: total A                           ADL either performed or assessed with clinical judgement   ADL   Eating/Feeding: Bed level;Maximal assistance Eating/Feeding Details (indicate cue type and reason): Pt able to initiate retrieval of cup of water from OT with R hand then bring straw to mouth. Needs help with positioning cup in hand R, encouraged family to perform less assistance and have pt attempt as much as he can when able. Grooming: Sitting;Wash/dry face;Total assistance Grooming Details (indicate cue type and reason): hand over hand assist cue to have pt wash face with RUE. Attempted to have pt brush teeting (dry brush) with hand/hand RUE but no initiation or sequence ability.  General ADL Comments: Max A to tota A for ADLs, limited initiation.    Extremity/Trunk Assessment Upper Extremity Assessment RUE Deficits / Details: AROM to remove crumbles from L eye, using R hand to reach for cup of water.            Vision       Perception     Praxis     Communication Communication Communication:  Impaired Factors Affecting Communication: Difficulty expressing self   Cognition Arousal: Alert Behavior During Therapy: Flat affect Cognition: Cognition impaired     Awareness: Intellectual awareness impaired, Online awareness impaired   Attention impairment (select first level of impairment): Sustained attention, Focused attention Executive functioning impairment (select all impairments): Initiation, Sequencing, Reasoning, Organization, Problem solving OT - Cognition Comments: Pt able to state his brother's names scoot and pewny when prompted.                 Following commands: Impaired Following commands impaired: Follows one step commands inconsistently      Cueing   Cueing Techniques: Gestural cues, Verbal cues, Tactile cues, Visual cues  Exercises      Shoulder Instructions       General Comments      Pertinent Vitals/ Pain       Pain Assessment Pain Assessment: Faces Faces Pain Scale: Hurts little more Pain Location: Pt stating shit repeatedly during transfer, unable to identify area of pain Pain Descriptors / Indicators: Guarding, Grimacing Pain Intervention(s): Monitored during session, Limited activity within patient's tolerance  Home Living                                          Prior Functioning/Environment              Frequency  Min 2X/week        Progress Toward Goals  OT Goals(current goals can now be found in the care plan section)  Progress towards OT goals: Not progressing toward goals - comment (similar presentation)  Acute Rehab OT Goals Patient Stated Goal: Per family, for pt to have opportunities to move and stand ad lib OT Goal Formulation: With family Time For Goal Achievement: 10/27/23 Potential to Achieve Goals: Fair  Plan      Co-evaluation                 AM-PAC OT 6 Clicks Daily Activity     Outcome Measure   Help from another person eating meals?: A Lot Help from another  person taking care of personal grooming?: Total Help from another person toileting, which includes using toliet, bedpan, or urinal?: Total Help from another person bathing (including washing, rinsing, drying)?: Total Help from another person to put on and taking off regular upper body clothing?: Total Help from another person to put on and taking off regular lower body clothing?: Total 6 Click Score: 7    End of Session    OT Visit Diagnosis: Other abnormalities of gait and mobility (R26.89);Unsteadiness on feet (R26.81);Other symptoms and signs involving cognitive function;Pain;Cognitive communication deficit (R41.841);Apraxia (R48.2) Pain - Right/Left: Left Pain - part of body: Shoulder   Activity Tolerance Patient tolerated treatment well   Patient Left in bed;with call bell/phone within reach;with bed alarm set;with family/visitor present   Nurse Communication Mobility status        Time: [id number] OT Time Calculation (min): 27 min  Charges: OT General  Charges $OT Visit: 1 Visit OT Treatments $Self Care/Home Management : 8-22 mins $Therapeutic Activity: 8-22 mins  10/25/2023  AB, OTR/L  Acute Rehabilitation Services  Office: [phone]   Jorene New 10/25/2023, 3:25 PM

## 2023-10-25 NOTE — Progress Notes (Addendum)
 PROGRESS NOTE    Connor Dixon  WNU:[id number] DOB: 1935-03-26 DOA: 10/10/2023 PCP: Arcadio Knuckles, MD   Brief Narrative: 89 year old with past medical history significant for chronic diastolic heart failure, complete heart block status post pacemaker, CKD 3, diabetes, hypertension, hyperlipidemia who was admitted after being found slumped over in his chair at home and unresponsive.  Last known well was the evening prior to admission on 10/09/2023.  Baseline he is considered high functioning and ambulates independently and lives alone.  Evaluation consistent with CT head no acute process.  CTA head and neck demonstrated no large vessel occlusion.  There was evidence of a remote lacunar infarct right corona radiata.  MRI was negative for acute intracranial abnormality.  Evidence for chronic small vessels disease.  EEG unremarkable.  LP performed 10/13/2023 has  rule out meningitis.  Antibiotics for meningitis were discontinued by neurology.  Patient received a steroid to cover Autoimmune component and subsequently discontinued 10/14/2023.  Auto-immune panel performed at [facility] was negative.  MS fluctuates, says few words out of context. Does not follows consistent command. Family mention he did better today with therapist   Assessment & Plan:   Principal Problem:   Acute encephalopathy Active Problems:   Hyperlipidemia with target LDL less than 70   Essential hypertension   Sinus node dysfunction (HCC)   Insulin -requiring or dependent type II diabetes mellitus (HCC)   Paroxysmal atrial fibrillation (HCC)   Elevated troponin   Prolonged QT interval   Palliative care by specialist   1-Acute metabolic encephalopathy -MRI Negative for acute stroke, EEG no acute seizures.  LP- for meningitis. -Completed 5 days of IVIG on 6//2025 not significant improvement noted. - Repeated MRI 6/3 with contrast was negative for acute finding, mention chronic small vessel disease, moderate generalized  atrophy and white matter disease bilaterally.  Chronic CVA. - B12 low normal getting intramuscular injection level was normal - Still in the differential of rapid neurocognitive decline from frontotemporal dementia versus global cognitive decline.  Awaiting final neurology recommendation --Need long-term care -Auto-immune panel : Healthy immune encephalopathy antibody needed.,  Anti-Hu antibody negative, antineuronal antibody negative,  anti-Yo antibody negative,Amphiphysin Antibody Negative, mGluR1 Antibody Negative, Ma2/Ta Antibody Negative, GABA-B-R Antibody    Negative.    -There is another Autoimmune panel sent to [facility] per [facility] that will be resulted on 6/12.  2-Suspected cognitive impairment, POA - Patient had some more forgetfulness with directions, cooking/prepping meals, finances, and not driving lawn mower well .  - Could be possible that Cognitive impairment became acutely worse in setting of acute illness.    Acute on chronic CKD 3 - Baseline Appears to be 1.5-1.6 -Creatinine peak to 2.5 Plan to give IV fluids, Cr increased to 1.9 Monitor urine out put.   Insulin -dependent diabetes type 2 -Last A1c 8.7% on 09/06/2023  -some intermittent hypoglycemia.  CBG increasing, will increase lantus  to 14 units  Essential hypertension Continue with amlodipine   Elevated serum osmolar gap IgG normal/elevated; no free kappa chains; no M-spike   Elevated troponin - Echo unremarkable  Acute urinary retention - -Foley discontinued 6/2   Hyperlipidemia; not able to tolerate regular statin  Prolonged QT Sinus node dysfunction (HCC) - s/p pacemaker   Rash chest left shoulder, start Bacitracin.,   Estimated body mass index is 30.5 kg/m as calculated from the following:   Height as of 09/06/23: 5' 8 (1.727 m).   Weight as of this encounter: 91 kg.   DVT prophylaxis: Eliquis   Code  Status: DNR limited Family Communication: Son who was at bedside Disposition Plan:   Status is: Inpatient Remains inpatient appropriate because: management of encephalopathy    Consultants:  Neurology   Procedures:    Antimicrobials:    Subjective: He is alert, does not follows commands. Says few words,   Objective: Vitals:   10/24/23 1937 10/24/23 2322 10/25/23 0406 10/25/23 0731  BP: (!) 97/49 (!) 107/54 (!) 100/51 (!) 127/97  Pulse: 88 79 89 (!) 106  Resp: 18 18 18 19   Temp: 99.3 F (37.4 C) 99.2 F (37.3 C) 99.3 F (37.4 C) 99.3 F (37.4 C)  TempSrc: Oral Oral Oral Oral  SpO2: 97% 98% 98% 98%  Weight:        Intake/Output Summary (Last 24 hours) at 10/25/2023 0801 Last data filed at 10/25/2023 0516 Gross per 24 hour  Intake 540 ml  Output 1700 ml  Net -1160 ml   Filed Weights   10/10/23 1705  Weight: 91 kg    Examination:  General exam: Appears calm and comfortable  Respiratory system: Clear to auscultation. Respiratory effort normal. Cardiovascular system: S1 & S2 heard, RRR. No JVD, murmurs, rubs, gallops or clicks. No pedal edema. Gastrointestinal system: Abdomen is nondistended, soft and nontender. No organomegaly or masses felt. Normal bowel sounds heard. Central nervous system: Alert  Extremities: no edema   Data Reviewed: I have personally reviewed following labs and imaging studies  CBC: Recent Labs  Lab 10/20/23 0340 10/21/23 0410  WBC 5.2 4.6  NEUTROABS 2.9 2.6  HGB 13.6 13.7  HCT 38.9* 39.9  MCV 88.0 89.1  PLT 119* 118*   Basic Metabolic Panel: Recent Labs  Lab 10/19/23 0612 10/20/23 0340 10/21/23 0410 10/22/23 0809 10/23/23 0440  NA 134* 135  --  133* 133*  K 3.4* 4.1  --  4.1 4.9  CL 104 104  --  104 106  CO2 22 22  --  21* 22  GLUCOSE 61* 113*  --  195* 95  BUN 38* 34*  --  24* 22  CREATININE 1.66* 1.67*  --  1.51* 1.69*  CALCIUM 8.0* 8.1*  --  8.1* 8.3*  MG 2.0 1.9 2.0  --   --   PHOS 2.3* 3.4 2.2*  --   --    GFR: Estimated Creatinine Clearance: 34.3 mL/min (A) (by C-G formula based on SCr of  1.69 mg/dL (H)). Liver Function Tests: No results for input(s): AST, ALT, ALKPHOS, BILITOT, PROT, ALBUMIN in the last 168 hours. No results for input(s): LIPASE, AMYLASE in the last 168 hours. Recent Labs  Lab 10/21/23 0410  AMMONIA 22   Coagulation Profile: No results for input(s): INR, PROTIME in the last 168 hours. Cardiac Enzymes: No results for input(s): CKTOTAL, CKMB, CKMBINDEX, TROPONINI in the last 168 hours. BNP (last 3 results) No results for input(s): PROBNP in the last 8760 hours. HbA1C: No results for input(s): HGBA1C in the last 72 hours. CBG: Recent Labs  Lab 10/24/23 0616 10/24/23 1146 10/24/23 1643 10/24/23 2116 10/25/23 0613  GLUCAP 233* 375* 327* 312* 299*   Lipid Profile: No results for input(s): CHOL, HDL, LDLCALC, TRIG, CHOLHDL, LDLDIRECT in the last 72 hours. Thyroid  Function Tests: No results for input(s): TSH, T4TOTAL, FREET4, T3FREE, THYROIDAB in the last 72 hours. Anemia Panel: No results for input(s): VITAMINB12, FOLATE, FERRITIN, TIBC, IRON, RETICCTPCT in the last 72 hours. Sepsis Labs: No results for input(s): PROCALCITON, LATICACIDVEN in the last 168 hours.  No results found for this or any  previous visit (from the past 240 hours).       Radiology Studies: No results found.      Scheduled Meds:  amLODipine   10 mg Oral Daily   apixaban   2.5 mg Oral BID   cyanocobalamin   1,000 mcg Subcutaneous Daily   folic acid   1 mg Oral Daily   insulin  aspart  0-5 Units Subcutaneous QHS   insulin  aspart  0-9 Units Subcutaneous TID WC   insulin  glargine-yfgn  10 Units Subcutaneous Daily   lactose free nutrition  237 mL Oral BID BM   metoprolol  tartrate  12.5 mg Oral BID   Continuous Infusions:   LOS: 15 days    Time spent: 35 minutes    Duncan Alejandro A Aldan Camey, MD [facility]   If 7PM-7AM, please contact night-coverage [email or web address]  10/25/2023, 8:01 AM

## 2023-10-25 NOTE — Progress Notes (Signed)
 Physical Therapy Treatment Patient Details Name: Connor Dixon MRN: [id number] DOB: 28-Nov-1934 Today's Date: 10/25/2023   History of Present Illness Pt is 89 year old [facility] on  10[facility]10/23 after found at home unresponsive. Pt with dense encephalopathy with flexor positioning of UE's. LP performed on 10/13/2023 has ruled out meningitis. CT and MRI on 5/29 were negative for acute intercranial abnormality.  PMH - afib on Eliquis , pacer, HTN, CKD, DM, obesity    PT Comments  Pt is fluctuating with functional abilities. Currently pt is 2 person Max A for bed mobility, sit to stand and transfers. Pt was able to sit EOB at Max A initially intermittently progress to Min A with heavy multi modal cues. Pt stood initially at 2 person Mod/Max A for transfer pt was a total A due to fatigue. Due to pt current functional status, home set up and available assistance at home recommending skilled physical therapy services < 3 hours/day in order to address strength, balance and functional mobility to decrease risk for falls, injury, immobility, skin break down and re-hospitalization.      If plan is discharge home, recommend the following: Two people to help with walking and/or transfers;Assistance with cooking/housework;Supervision due to cognitive status;Assist for transportation;Help with stairs or ramp for entrance   Can travel by private vehicle     No  Equipment Recommendations  BSC/3in1;Wheelchair cushion (measurements PT);Wheelchair (measurements PT);Hoyer lift;Hospital bed       Precautions / Restrictions Precautions Precautions: Fall Recall of Precautions/Restrictions: Impaired Restrictions Weight Bearing Restrictions Per Provider Order: No     Mobility  Bed Mobility Overal bed mobility: Needs Assistance Bed Mobility: Supine to Sit     Supine to sit: Max assist, +2 for physical assistance     General bed mobility comments: increased time to see if pt will initiate with gesturing  cues; assist for lifting trunk, scooting hips and legs off EOB    Transfers Overall transfer level: Needs assistance Equipment used: None Transfers: Sit to/from Stand, Bed to chair/wheelchair/BSC Sit to Stand: Max assist, +2 physical assistance, From elevated surface     Squat pivot transfers: +2 physical assistance, Total assist     General transfer comment: Pt was Max A +2 to get to standing, pt then sat back down and second attempt with squat pivot; pt very fatigued and requires total A for transfer    Ambulation/Gait     General Gait Details: unable this date       Balance Overall balance assessment: Needs assistance Sitting-balance support: Feet supported Sitting balance-Leahy Scale: Zero Sitting balance - Comments: max A with brief periods of MIn A for EOB sitting due to leaning R and back Postural control: Right lateral lean, Posterior lean Standing balance support: Bilateral upper extremity supported Standing balance-Leahy Scale: Zero Standing balance comment: unable to stand fully, heavy 2 person external assist        Communication Communication Communication: Impaired Factors Affecting Communication: Difficulty expressing self  Cognition Arousal: Alert Behavior During Therapy: Flat affect   PT - Cognitive impairments: Initiation, Attention, Problem solving, Awareness, Safety/Judgement Difficult to assess due to: Impaired communication       PT - Cognition Comments: evidently (perseverative); internal distractions with focused attention Following commands: Impaired Following commands impaired: Follows one step commands inconsistently    Cueing Cueing Techniques: Gestural cues, Verbal cues, Tactile cues, Visual cues         Pertinent Vitals/Pain Pain Assessment Pain Assessment: Faces Faces Pain Scale: Hurts little more  Breathing: normal Negative Vocalization: occasional moan/groan, low speech, negative/disapproving quality Facial Expression:  sad, frightened, frown Body Language: relaxed Consolability: no need to console PAINAD Score: 2 Pain Location: intermittently curses during assisted mobility possibly due to discomfort or due to startle Pain Descriptors / Indicators: Guarding, Grimacing Pain Intervention(s): Limited activity within patient's tolerance     PT Goals (current goals can now be found in the care plan section) Acute Rehab PT Goals Patient Stated Goal: improve pt mobility PT Goal Formulation: With family Time For Goal Achievement: 10/27/23 Potential to Achieve Goals: Fair Progress towards PT goals: Progressing toward goals    Frequency    Min 2X/week      PT Plan  Continue with current POC        AM-PAC PT 6 Clicks Mobility   Outcome Measure  Help needed turning from your back to your side while in a flat bed without using bedrails?: A Lot Help needed moving from lying on your back to sitting on the side of a flat bed without using bedrails?: Total Help needed moving to and from a bed to a chair (including a wheelchair)?: Total Help needed standing up from a chair using your arms (e.g., wheelchair or bedside chair)?: Total Help needed to walk in hospital room?: Total Help needed climbing 3-5 steps with a railing? : Total 6 Click Score: 7    End of Session Equipment Utilized During Treatment: Gait belt Activity Tolerance: Patient tolerated treatment well;Patient limited by fatigue Patient left: in chair;with chair alarm set;with call bell/phone within reach;with family/visitor present Nurse Communication: Mobility status;Need for lift equipment PT Visit Diagnosis: Other abnormalities of gait and mobility (R26.89);Other symptoms and signs involving the nervous system (R29.898);Muscle weakness (generalized) (M62.81)     Time: 4098-1191 PT Time Calculation (min) (ACUTE ONLY): 24 min  Charges:    $Therapeutic Activity: 23-37 mins PT General Charges $$ ACUTE PT VISIT: 1 Visit                     Sloan Duncans, DPT, CLT  Acute Rehabilitation Services Office: [phone] (Secure chat preferred)    Jenice Mitts 10/25/2023, 2:23 PM

## 2023-10-26 DIAGNOSIS — G9340 Encephalopathy, unspecified: Secondary | ICD-10-CM | POA: Diagnosis not present

## 2023-10-26 DIAGNOSIS — G3183 Dementia with Lewy bodies: Secondary | ICD-10-CM | POA: Diagnosis not present

## 2023-10-26 DIAGNOSIS — F0280 Dementia in other diseases classified elsewhere without behavioral disturbance: Secondary | ICD-10-CM | POA: Diagnosis not present

## 2023-10-26 LAB — BASIC METABOLIC PANEL WITH GFR
Anion gap: 5 (ref 5–15)
BUN: 30 mg/dL — ABNORMAL HIGH (ref 8–23)
CO2: 23 mmol/L (ref 22–32)
Calcium: 8.5 mg/dL — ABNORMAL LOW (ref 8.9–10.3)
Chloride: 105 mmol/L (ref 98–111)
Creatinine, Ser: 1.83 mg/dL — ABNORMAL HIGH (ref 0.61–1.24)
GFR, Estimated: 35 mL/min — ABNORMAL LOW (ref >60)
Glucose, Bld: 174 mg/dL — ABNORMAL HIGH (ref 70–99)
Potassium: 4.6 mmol/L (ref 3.5–5.1)
Sodium: 133 mmol/L — ABNORMAL LOW (ref 135–145)

## 2023-10-26 LAB — CBC WITH DIFFERENTIAL/PLATELET
Abs Immature Granulocytes: 0.02 10*3/uL (ref 0.00–0.07)
Basophils Absolute: 0 10*3/uL (ref 0.0–0.1)
Basophils Relative: 0 %
Eosinophils Absolute: 0.2 10*3/uL (ref 0.0–0.5)
Eosinophils Relative: 4 %
HCT: 37.6 % — ABNORMAL LOW (ref 39.0–52.0)
Hemoglobin: 12.7 g/dL — ABNORMAL LOW (ref 13.0–17.0)
Immature Granulocytes: 0 %
Lymphocytes Relative: 20 %
Lymphs Abs: 1 10*3/uL (ref 0.7–4.0)
MCH: 30.3 pg (ref 26.0–34.0)
MCHC: 33.8 g/dL (ref 30.0–36.0)
MCV: 89.7 fL (ref 80.0–100.0)
Monocytes Absolute: 0.7 10*3/uL (ref 0.1–1.0)
Monocytes Relative: 14 %
Neutro Abs: 3 10*3/uL (ref 1.7–7.7)
Neutrophils Relative %: 62 %
Platelets: 180 10*3/uL (ref 150–400)
RBC: 4.19 MIL/uL — ABNORMAL LOW (ref 4.22–5.81)
RDW: 14.6 % (ref 11.5–15.5)
WBC: 4.9 10*3/uL (ref 4.0–10.5)
nRBC: 0 % (ref 0.0–0.2)

## 2023-10-26 LAB — GLUCOSE, CAPILLARY
Glucose-Capillary: 159 mg/dL — ABNORMAL HIGH (ref 70–99)
Glucose-Capillary: 271 mg/dL — ABNORMAL HIGH (ref 70–99)
Glucose-Capillary: 240 mg/dL — ABNORMAL HIGH (ref 70–99)
Glucose-Capillary: 264 mg/dL — ABNORMAL HIGH (ref 70–99)

## 2023-10-26 MED ORDER — BOOST PLUS PO LIQD
237.0000 mL | Freq: Three times a day (TID) | ORAL | Status: DC
  Administered 2023-10-27 (×2): 237 mL via ORAL
  Filled 2023-10-26 (×4): qty 237

## 2023-10-26 NOTE — Progress Notes (Signed)
 NEUROLOGY CONSULT FOLLOW UP NOTE   Date of service: October 26, 2023 Patient Name: Connor Dixon MRN:  [id number] DOB:  1934-06-04  Interval Hx/subjective   Seen in room, initially sleeping but awakens to voice. Smiles and says yes to all questions and says shit when his feet are touched. No family at the bedside at the time of my exam  Vitals   Vitals:   10/25/23 1557 10/25/23 1914 10/26/23 0003 10/26/23 0342  BP: 135/68 132/69 (!) 108/43 (!) 118/55  Pulse: 87 87 75 81  Resp: 19 18 18 18   Temp: 98.7 F (37.1 C) 98.4 F (36.9 C) 98.5 F (36.9 C) 98.5 F (36.9 C)  TempSrc: Oral Oral Oral Oral  SpO2: 100% 96% 99% 98%  Weight:      Height:         Body mass index is 31.42 kg/m.  Physical Exam   Constitutional: elderly male in NAD  Psych: Affect appropriate to situation.   Eyes: No scleral injection.   HENT: No OP obstrucion.   Head: Normocephalic.   Cardiovascular: Normal rate and regular rhythm.   Respiratory: Effort normal, non-labored breathing.   GI: Soft.  No distension. There is no tenderness.   Skin: WDI.    Neurologic Examination   Ment: Awakens to voice, consistently tracks examiner. Says yes to all questions. Does not follow simple commands. Swore and glanced at examiner with a frown when examiner attempted to passively move hips, but patient would not say if it hurt, or where, when asked.  CN: Tracking examiner visually. Blinks to threat bilaterally, facial movement symmetric, no dysarthria, hearing intact to voice, shoulder shrug symmetric, tongue protrusion midline.  Motor/Sensory: He moves all extremities spontaneously. Withdraws in all extremities. Moderately increased tone in BUE with subtle cogwheel rigidity. Mild to moderately increased tone in BLE.  Seems to have a right sided preference but does move left side purposefully.   Medications  Current Facility-Administered Medications:    0.9 %  sodium chloride  infusion, , Intravenous, Continuous,  Regalado, Belkys A, MD, Last Rate: 100 mL/hr at 10/26/23 0236, New Bag at 10/26/23 0236   acetaminophen  (TYLENOL ) tablet 650 mg, 650 mg, Oral, Q4H PRN, 650 mg at 10/13/23 0555 **OR** acetaminophen  (TYLENOL ) 160 MG/5ML solution 650 mg, 650 mg, Per Tube, Q4H PRN **OR** acetaminophen  (TYLENOL ) suppository 650 mg, 650 mg, Rectal, Q4H PRN, Doutova, Anastassia, MD   amLODipine  (NORVASC ) tablet 10 mg, 10 mg, Oral, Daily, Wouk, Haynes Lips, MD, 10 mg at 10/25/23 2952   apixaban  (ELIQUIS ) tablet 2.5 mg, 2.5 mg, Oral, BID, Swayze, Ava, DO, 2.5 mg at 10/25/23 2134   artificial tears ophthalmic solution 1 drop, 1 drop, Both Eyes, PRN, Dela Favor, Devon, NP, 1 drop at 10/24/23 1001   bacitracin ointment, , Topical, BID, Regalado, Belkys A, MD, 31.5 Application at 10/25/23 2136   cyanocobalamin  (VITAMIN B12) injection 1,000 mcg, 1,000 mcg, Subcutaneous, Daily, Girguis, David, MD, 1,000 mcg at 10/25/23 0839   folic acid  (FOLVITE ) tablet 1 mg, 1 mg, Oral, Daily, Utomwen, Adesuwa, RPH, 1 mg at 10/25/23 0839   hydrALAZINE  (APRESOLINE ) tablet 10 mg, 10 mg, Oral, Q8H PRN, Swayze, Ava, DO, 10 mg at 10/15/23 1517   insulin  aspart (novoLOG ) injection 0-5 Units, 0-5 Units, Subcutaneous, QHS, Girguis, David, MD, 4 Units at 10/24/23 2129   insulin  aspart (novoLOG ) injection 0-9 Units, 0-9 Units, Subcutaneous, TID WC, Girguis, David, MD, 2 Units at 10/26/23 0645   insulin  glargine-yfgn (SEMGLEE ) injection 14 Units, 14 Units, Subcutaneous, Daily, Regalado,  Belkys A, MD   lactose free nutrition (BOOST PLUS) liquid 237 mL, 237 mL, Oral, BID BM, Wouk, Haynes Lips, MD, 237 mL at 10/25/23 1227   LORazepam  (ATIVAN ) injection 2 mg, 2 mg, Intravenous, Once PRN, Wouk, Haynes Lips, MD   metoprolol  tartrate (LOPRESSOR ) tablet 12.5 mg, 12.5 mg, Oral, BID, Wouk, Haynes Lips, MD, 12.5 mg at 10/25/23 2134   Oral care mouth rinse, 15 mL, Mouth Rinse, PRN, Faith Homes, MD  Labs and Diagnostic Imaging   CBC:  Recent Labs  Lab  10/21/23 0410 10/25/23 1152 10/26/23 0552  WBC 4.6 5.4 4.9  NEUTROABS 2.6  --  3.0  HGB 13.7 13.2 12.7*  HCT 39.9 38.9* 37.6*  MCV 89.1 89.4 89.7  PLT 118* 167 180    Basic Metabolic Panel:  Lab Results  Component Value Date   NA 133 (L) 10/26/2023   K 4.6 10/26/2023   CO2 23 10/26/2023   GLUCOSE 174 (H) 10/26/2023   BUN 30 (H) 10/26/2023   CREATININE 1.83 (H) 10/26/2023   CALCIUM 8.5 (L) 10/26/2023   GFRNONAA 35 (L) 10/26/2023   GFRAA 32 (L) 12/10/2019   Lipid Panel:  Lab Results  Component Value Date   LDLCALC 68 10/11/2023   HgbA1c:  Lab Results  Component Value Date   HGBA1C 8.7 (H) 09/06/2023   Urine Drug Screen:     Component Value Date/Time   LABOPIA NONE DETECTED 10/10/2023 1747   COCAINSCRNUR NONE DETECTED 10/10/2023 1747   LABBENZ NONE DETECTED 10/10/2023 1747   AMPHETMU NONE DETECTED 10/10/2023 1747   THCU NONE DETECTED 10/10/2023 1747   LABBARB NONE DETECTED 10/10/2023 1747    Alcohol Level     Component Value Date/Time   ETH <15 10/17/2023 1944   INR  Lab Results  Component Value Date   INR 1.2 10/10/2023   APTT  Lab Results  Component Value Date   APTT 37 (H) 10/10/2023    CT Head without contrast (Personally reviewed): No acute intracranial abnormality. ASPECTS 10   CT angio Head and Neck with contrast(Personally reviewed): No LVO. No evidence of significant stenosis, aneurysmal dilatation, or dissection involving the arteries of the head and neck  Repeat CT Head without contrast 5/28:  No acute intracranial abnormality. Generalized atrophy and findings of chronic microvascular disease.   MRI Brain(Personally reviewed): No acute intracranial abnormality by noncontrast MRI. Signal changes compatible with chronic small vessel disease. Generalized appearing brain volume loss.   MRI brain w/wo:  No acute intracranial abnormality. Moderate atrophy and white matter disease bilaterally. Remote white matter infarcts in the right corona  radiata and a remote lacunar infarct in the right lenctiform nucleus.  cEEG: 10/19/2023 1036 to 10/22/2023 1036  This study is suggestive of moderate diffuse encephalopathy. No seizures or epileptiform discharges were seen throughout the recording.   Labs  Serum paraneoplastic panel is pan-negative CSF [facility] autoimmune panel on CSF is pending   Assessment  Connor Dixon is an 89 y.o. male who presented with severe encephalopathy which is currently of unclear etiology.  Initially continued to show slow improvement daily, but now seems to have plateaued. Unclear if this was due to the antibiotics or simply natural history.  No evidence of seizure on prolonged EEG, no evidence of stroke on MRI. DDx includes seizures secondary to septic encephalopathy of unclear source with response to antibiotics, delirium of unclear etiology, autoimmune encephalopathy with some response seen from IVIG treatment. Continuous EEG was negative, and his exam did not seem  very consistent with seizure on arrival. His rigidity on exam, together with his abrupt cognitive decline, is compatible with possible Lewy body dementia.  - Exam today is stable.  - Extensive workup for CNS processes has not shown a clear etiology.  - LP was unremarkable.  - MRI brain showed no stroke.  - Serial EEGs showed slowing, but no electrographic seizures or focal epileptiform discharges were seen.  - IVIG completed 6/4. May have had slight benefit from this, but the mild improvement that was noted is felt more likely to have been spontaneous given negative paraneoplastic panel and no evidence for CNS inflammation on MRI or in CSF.  - B12 level is low at 270. He has been started on supplementation. This is not felt to be the etiology for his cognitive decline.  - For now, continue watchful waiting and PT/OT.  - He has low cognitive reserve and recovery, if possible, will take time. Diffuse cerebral and hippocampal atrophy seen on MRI provide  structural evidence for a low cognitive reserve.  - Serum paraneoplastic panel is pan-negative. Therefore, we would not recommend further immunotherapy at this time. - Given the unrevealing work up, an underlying dementia may explain his lack of improvement back to baseline from what is, based on the negative work up, an unknown precipitating condition that resulted in his acute decompensation at home. The patient's waxing and waning mentation here with an overall trend towards slight improvement since admission, suggests a possible overlapping hypoactive delirium. Given the patient's rigidity on exam today, Lewy body dementia is now higher on the DDx.    Recommendations  - OOB as able - Delirium Precautions - Day/night/sleep interventions - PT/OT  - Avoid antipsychotic medications.  - May need to be discharged to a SNF. Palliative is consulting. Family previously has indicated that he continue to be DNR, with limited, full scope of care otherwise. They have stated that they do not want a feeding tube should the need arise.  - He may benefit from a second opinion regarding possible Lewy body dementia at the [facility] Disorders clinic. Information provided to family on 6/11.   Addendum: - Per Dr. Landrum Pink, the encephalopathy panel sent to [facility] was from CSF; this one is still pending and per [facility] will result on or about 6/18. The one done in [place] was from Serum and is the one that has resulted.  - The pending results can be followed outpatient.  ______________________________________________________________________  Patient seen and examined by NP/APP.   Imogene Mana, DNP, FNP-BC [facility] Pager: [phone]   Electronically signed: Dr. Zed Wanninger

## 2023-10-26 NOTE — Plan of Care (Signed)
 progressing

## 2023-10-26 NOTE — Progress Notes (Signed)
 Calorie Count Note  48 hour calorie count ordered  Only one meal ticket in envelope to review. Pt with decent intake of meals and continues to be accepting on boost plus. Will increase boos plus to TID to come at each meal. Do feel that pt is meeting his nutrition needs.   Noted that TOC working on placement. Will follow-up as planned  Diet: DYS 1, thin Supplements: Boost Plus BID  6/12 Breakfast: 231 kcal, 14g protein Supplements: 1 boost plus (360 kcal, 14g protein)  Total intake for one meal: 591 kcal (37% of minimum estimated needs)  28 protein (35% of minimum estimated needs)  NUTRITION DIAGNOSIS:  Inadequate oral intake related to lethargy/confusion as evidenced by per patient/family report.   GOAL:  Patient will meet greater than or equal to 90% of their needs  INTERVENTION:  Continue current diet as ordered Feeding assistance Automatic trays Increase Boost Plus to TID to provide 360 kcal and 14g protein    Edwena Graham, RD, LDN Registered Dietitian II Please reach out via secure chat

## 2023-10-26 NOTE — TOC Progression Note (Addendum)
 Transition of Care [facility]) - Progression Note    Patient Details  Name: Connor Dixon MRN: [id number] Date of Birth: August 10, 1934  [facility]) CM/SW Contact  Tandy Fam, [place] Phone Number: 10/26/2023, 2:15 PM  Clinical Narrative:   CSW met with son, [name], at bedside to discuss disposition and answer questions. [name] is encouraged by the patient's progress so far, remains hopeful for continued improvement at [facility]. [name] requested time to discuss with his brother and call CSW back with SNF choice. CSW received call later from [name] that they would like to move forward with [facility]. CSW confirmed bed availability with [facility], and requested CMA to initiate insurance authorization. CSW to follow.  UPDATE: CSW received call from son that he had another meeting with the doctor about pending [facility] panel and that if it's positive, the patient may need a transfer to [facility] or [facility] for treatment. Son had called [facility] and they would not be able to do a hospital transfer, so the patient would have to stay here. CSW relayed message to MD and discussed concerns about possible treatment required pending the lab results. MD to follow up, will update CSW with plan. CSW updated [facility]. CSW to follow.    Expected Discharge Plan: Skilled Nursing Facility Barriers to Discharge: Insurance Authorization  Expected Discharge Plan and Services                                               Social Determinants of Health (SDOH) Interventions SDOH Screenings   Food Insecurity: Patient Unable To Answer (10/13/2023)  Housing: Patient Unable To Answer (10/13/2023)  Transportation Needs: Patient Unable To Answer (10/13/2023)  Utilities: Patient Unable To Answer (10/13/2023)  Alcohol  Screen: Low Risk  (04/06/2023)  Depression (PHQ2-9): Low Risk  (09/06/2023)  Financial Resource Strain: Low Risk  (04/06/2023)  Physical Activity: Inactive (04/06/2023)  Social  Connections: Unknown (10/13/2023)  Stress: No Stress Concern Present (04/06/2023)  Tobacco Use: Low Risk  (10/10/2023)  Health Literacy: Adequate Health Literacy (04/06/2023)    Readmission Risk Interventions     No data to display

## 2023-10-26 NOTE — Progress Notes (Signed)
 Patients family wants to wait to move patient to rehab until [facility] clinic test results come back.

## 2023-10-26 NOTE — Progress Notes (Signed)
 Speech Language Pathology Treatment: Cognitive-Linguistic  Patient Details Name: Connor Dixon MRN: [id number] DOB: January 22, 1935 Today's Date: 10/26/2023 Time: 0630-1601 SLP Time Calculation (min) (ACUTE ONLY): 16 min  Assessment / Plan / Recommendation Clinical Impression  Pt sleepy and needed frequent verbal and tactile cues. If not stimulated after 15-20 seconds pt would fall asleep. He continues with spontaneous utterances of yeah, alright. He did not follow commands with verbal or visual cues and states yeah. During counting with verbal and visual/written cues he said/read numbers omitting one number each trial before needing cues to wake.  SLP initiated singing familiar song and pt able to sing majority of song with good tune and accurate words, omitting several words which is improvement from previous session. No response to attempt with 2nd song. Continued to provide education to son and daughter-in-law and answered questions. Continue ST and pt continues to need rehab < 3 hours a day.   HPI HPI: Pt is 89 year old presented to [facility] on  10/10/23 after found at home unresponsive. Pt with dense encephalopathy with flexor positioning of UE's. CT negative for acute changes and MRI reports No acute intracranial abnormality by noncontrast MRI.  2. Signal changes compatible with chronic small vessel disease. Generalized appearing brain volume loss.  PMH - afib on Eliquis , pacer, HTN, CKD, DM, obesity      SLP Plan  Continue with current plan of care          Recommendations                     Oral care BID   Frequent or constant Supervision/Assistance Dysphagia, unspecified (R13.10);Aphasia (R47.01);Cognitive communication deficit (U93.235)     Continue with current plan of care     [name]  10/26/2023, 9:23 AM

## 2023-10-26 NOTE — Plan of Care (Signed)
  Problem: Fluid Volume: Goal: Ability to maintain a balanced intake and output will improve Outcome: Progressing   Problem: Metabolic: Goal: Ability to maintain appropriate glucose levels will improve Outcome: Progressing   Problem: Nutritional: Goal: Maintenance of adequate nutrition will improve Outcome: Progressing Goal: Progress toward achieving an optimal weight will improve Outcome: Progressing   Problem: Skin Integrity: Goal: Risk for impaired skin integrity will decrease Outcome: Progressing   Problem: Tissue Perfusion: Goal: Adequacy of tissue perfusion will improve Outcome: Progressing   Problem: Nutrition: Goal: Risk of aspiration will decrease Outcome: Progressing Goal: Dietary intake will improve Outcome: Progressing   Problem: Clinical Measurements: Goal: Will remain free from infection Outcome: Progressing Goal: Diagnostic test results will improve Outcome: Progressing Goal: Respiratory complications will improve Outcome: Progressing Goal: Cardiovascular complication will be avoided Outcome: Progressing   Problem: Activity: Goal: Risk for activity intolerance will decrease Outcome: Progressing   Problem: Nutrition: Goal: Adequate nutrition will be maintained Outcome: Progressing   Problem: Coping: Goal: Level of anxiety will decrease Outcome: Progressing   Problem: Elimination: Goal: Will not experience complications related to bowel motility Outcome: Progressing Goal: Will not experience complications related to urinary retention Outcome: Progressing   Problem: Pain Managment: Goal: General experience of comfort will improve and/or be controlled Outcome: Progressing   Problem: Safety: Goal: Ability to remain free from injury will improve Outcome: Progressing   Problem: Skin Integrity: Goal: Risk for impaired skin integrity will decrease Outcome: Progressing   Problem: Education: Goal: Ability to describe self-care measures that may  prevent or decrease complications (Diabetes Survival Skills Education) will improve Outcome: Not Progressing   Problem: Health Behavior/Discharge Planning: Goal: Ability to identify and utilize available resources and services will improve Outcome: Not Progressing Goal: Ability to manage health-related needs will improve Outcome: Not Progressing   Problem: Coping: Goal: Will verbalize positive feelings about self Outcome: Not Progressing Goal: Will identify appropriate support needs Outcome: Not Progressing   Problem: Health Behavior/Discharge Planning: Goal: Ability to manage health-related needs will improve Outcome: Not Progressing Goal: Goals will be collaboratively established with patient/family Outcome: Not Progressing   Problem: Self-Care: Goal: Ability to participate in self-care as condition permits will improve Outcome: Not Progressing Goal: Verbalization of feelings and concerns over difficulty with self-care will improve Outcome: Not Progressing Goal: Ability to communicate needs accurately will improve Outcome: Not Progressing   Problem: Education: Goal: Knowledge of General Education information will improve Description: Including pain rating scale, medication(s)/side effects and non-pharmacologic comfort measures Outcome: Not Progressing   Problem: Health Behavior/Discharge Planning: Goal: Ability to manage health-related needs will improve Outcome: Not Progressing   Problem: Clinical Measurements: Goal: Ability to maintain clinical measurements within normal limits will improve Outcome: Not Progressing

## 2023-10-26 NOTE — Progress Notes (Signed)
 PROGRESS NOTE    Connor Dixon  JYN:[id number] DOB: [birth year] DOA: 10/10/2023 PCP: Arcadio Knuckles, MD   Brief Narrative: 89 year old with past medical history significant for chronic diastolic heart failure, complete heart block status post pacemaker, CKD 3, diabetes, hypertension, hyperlipidemia who was admitted after being found slumped over in his chair at home and unresponsive.  Last known well was the evening prior to admission on 10/09/2023.  Baseline he is considered high functioning and ambulates independently and lives alone.  Evaluation consistent with CT head no acute process.  CTA head and neck demonstrated no large vessel occlusion.  There was evidence of a remote lacunar infarct right corona radiata.  MRI was negative for acute intracranial abnormality.  Evidence for chronic small vessels disease.  EEG unremarkable.  LP performed 10/13/2023 has  rule out meningitis.  Antibiotics for meningitis were discontinued by neurology.  Patient received a steroid to cover Autoimmune component and subsequently discontinued 10/14/2023. He also completed IV IG for Autoimmune encephalopathy.   Serum Auto-immune panel performed at [facility] was negative.  MS fluctuates, says few words out of context. Does not follows consistent command. Family mention patient is doing  better  with therapist 6/11 and 6/12.  CSF Autoimmune panel sent to [facility] clinic wont be back until 6/18. Discussed with neurology this test can be follow up out patient.   Family asked for second opinion, I called [facility], they declined patient in transfer because they are at capacity. I sent referral to out patient movement disorder at [facility].   Assessment & Plan:   Principal Problem:   Acute encephalopathy Active Problems:   Hyperlipidemia with target LDL less than 70   Essential hypertension   Sinus node dysfunction (HCC)   Insulin -requiring or dependent type II diabetes mellitus (HCC)   Paroxysmal atrial fibrillation (HCC)    Elevated troponin   Prolonged QT interval   Palliative care by specialist   1-Acute metabolic Encephalopathy -MRI Negative for acute stroke, EEG no acute seizures.  LP- for meningitis. -Completed 5 days of IVIG on 6//2025 not significant improvement noted. -Ammonia 22, ACTH  test negative, Thiamine  107, HIV non reactive, RPR non reactive, ANA negative, VDRL CSF negative, no oligoclonal band seen on CSF, meningitis encephalitis panel negative, CSF culture negative. Blood culture negative, TSH: 1.0 - Repeated MRI 6/3 with contrast was negative for acute finding, mention chronic small vessel disease, moderate generalized atrophy and white matter disease bilaterally.  Chronic CVA. - B12 low normal (270) getting intramuscular injection .  - Still in the differential of rapid neurocognitive decline from frontotemporal dementia versus global cognitive decline.   --Need long-term care vs Rehab.  -Auto-immune panel Serum : Autoimmune  encephalopathy antibody negative,  Anti-Hu antibody negative, antineuronal antibody negative,  anti-Yo antibody negative,Amphiphysin Antibody Negative, mGluR1 Antibody Negative, Ma2/Ta Antibody Negative, GABA-B-R Antibody    Negative.    -CSF: Autoimmune panel sent to [facility] per [facility] that will be resulted now by 6/18.  Per neurology ok to follow up this results out patient.   2-Suspected cognitive impairment, POA - Patient had some more forgetfulness with directions, cooking/prepping meals, finances, and not driving lawn mower well .  - Could be possible that cognitive impairment became acutely worse in setting of acute illness.  Referral to Outpatient [facility] clinic made.  Acute on chronic CKD 3 - Baseline Appears to be 1.5-1.6 -Creatinine peak to 2.5 -Will continue with IV fluid for another 24 hour.   Insulin -dependent diabetes type 2 -Last A1c  8.7% on 09/06/2023  -some intermittent hypoglycemia initially, now resolved.  CBG increasing,  increase lantus  to 14  units 6/12.  Essential hypertension Continue with amlodipine   Elevated serum osmolar gap IgG normal/elevated; no free kappa chains; no M-spike   Elevated troponin - Echo unremarkable  Acute urinary retention - -Foley discontinued 6/2  -good urine out out.   Hyperlipidemia; not able to tolerate regular statin  Prolonged QT Sinus node dysfunction (HCC) - s/p pacemaker   Rash chest left shoulder, started  Bacitracin., improved.   Estimated body mass index is 31.42 kg/m as calculated from the following:   Height as of this encounter: 5' 7 (1.702 m).   Weight as of this encounter: 91 kg.   DVT prophylaxis: Eliquis   Code Status: DNR limited Family Communication: Son who was at bedside Disposition Plan:  Status is: Inpatient Remains inpatient appropriate because: management of encephalopathy    Consultants:  Neurology   Procedures:    Antimicrobials:    Subjective: Patient wake up to voice, his MS fluctuates, per family he has some moment where he is more clear than others.  Patient says words out of content. At times would say yes, or no.  Rash chest shoulder is better.  Objective: Vitals:   10/25/23 1914 10/26/23 0003 10/26/23 0342 10/26/23 1201  BP: 132/69 (!) 108/43 (!) 118/55 130/63  Pulse: 87 75 81 78  Resp: 18 18 18 18   Temp: 98.4 F (36.9 C) 98.5 F (36.9 C) 98.5 F (36.9 C)   TempSrc: Oral Oral Oral   SpO2: 96% 99% 98% 99%  Weight:      Height:        Intake/Output Summary (Last 24 hours) at 10/26/2023 1221 Last data filed at 10/26/2023 1039 Gross per 24 hour  Intake 1943.18 ml  Output 1950 ml  Net -6.82 ml   Filed Weights   10/10/23 1705  Weight: 91 kg    Examination:  General exam: NAD Respiratory system: CTA Cardiovascular system:S 1, S 2 RRR Gastrointestinal system: BS present, soft, nt Central nervous system: alert, smile, would say few words, most out of content.  Extremities: no edema   Data Reviewed: I have personally  reviewed following labs and imaging studies  CBC: Recent Labs  Lab 10/20/23 0340 10/21/23 0410 10/25/23 1152 10/26/23 0552  WBC 5.2 4.6 5.4 4.9  NEUTROABS 2.9 2.6  --  3.0  HGB 13.6 13.7 13.2 12.7*  HCT 38.9* 39.9 38.9* 37.6*  MCV 88.0 89.1 89.4 89.7  PLT 119* 118* 167 180   Basic Metabolic Panel: Recent Labs  Lab 10/20/23 0340 10/21/23 0410 10/22/23 0809 10/23/23 0440 10/25/23 1152 10/26/23 0552  NA 135  --  133* 133* 131* 133*  K 4.1  --  4.1 4.9 4.5 4.6  CL 104  --  104 106 101 105  CO2 22  --  21* 22 24 23   GLUCOSE 113*  --  195* 95 276* 174*  BUN 34*  --  24* 22 33* 30*  CREATININE 1.67*  --  1.51* 1.69* 1.98* 1.83*  CALCIUM 8.1*  --  8.1* 8.3* 8.6* 8.5*  MG 1.9 2.0  --   --   --   --   PHOS 3.4 2.2*  --   --   --   --    GFR: Estimated Creatinine Clearance: 31.2 mL/min (A) (by C-G formula based on SCr of 1.83 mg/dL (H)). Liver Function Tests: No results for input(s): AST, ALT, ALKPHOS, BILITOT, PROT, ALBUMIN in  the last 168 hours. No results for input(s): LIPASE, AMYLASE in the last 168 hours. Recent Labs  Lab 10/21/23 0410  AMMONIA 22   Coagulation Profile: No results for input(s): INR, PROTIME in the last 168 hours. Cardiac Enzymes: No results for input(s): CKTOTAL, CKMB, CKMBINDEX, TROPONINI in the last 168 hours. BNP (last 3 results) No results for input(s): PROBNP in the last 8760 hours. HbA1C: No results for input(s): HGBA1C in the last 72 hours. CBG: Recent Labs  Lab 10/25/23 1259 10/25/23 1558 10/25/23 2115 10/26/23 0642 10/26/23 1158  GLUCAP 330* 204* 159* 159* 271*   Lipid Profile: No results for input(s): CHOL, HDL, LDLCALC, TRIG, CHOLHDL, LDLDIRECT in the last 72 hours. Thyroid  Function Tests: No results for input(s): TSH, T4TOTAL, FREET4, T3FREE, THYROIDAB in the last 72 hours. Anemia Panel: No results for input(s): VITAMINB12, FOLATE, FERRITIN, TIBC, IRON,  RETICCTPCT in the last 72 hours. Sepsis Labs: No results for input(s): PROCALCITON, LATICACIDVEN in the last 168 hours.  No results found for this or any previous visit (from the past 240 hours).       Radiology Studies: No results found.      Scheduled Meds:  amLODipine   10 mg Oral Daily   apixaban   2.5 mg Oral BID   bacitracin   Topical BID   cyanocobalamin   1,000 mcg Subcutaneous Daily   folic acid   1 mg Oral Daily   insulin  aspart  0-5 Units Subcutaneous QHS   insulin  aspart  0-9 Units Subcutaneous TID WC   insulin  glargine-yfgn  14 Units Subcutaneous Daily   lactose free nutrition  237 mL Oral BID BM   metoprolol  tartrate  12.5 mg Oral BID   Continuous Infusions:  sodium chloride  100 mL/hr at 10/26/23 1039     LOS: 16 days    Time spent: 35 minutes    Darryl Blumenstein A Lehua Flores, MD [facility]   If 7PM-7AM, please contact night-coverage [email or web address]  10/26/2023, 12:21 PM

## 2023-10-27 ENCOUNTER — Inpatient Hospital Stay (HOSPITAL_COMMUNITY)

## 2023-10-27 DIAGNOSIS — G9340 Encephalopathy, unspecified: Secondary | ICD-10-CM | POA: Diagnosis not present

## 2023-10-27 DIAGNOSIS — R4182 Altered mental status, unspecified: Secondary | ICD-10-CM | POA: Diagnosis not present

## 2023-10-27 DIAGNOSIS — R0989 Other specified symptoms and signs involving the circulatory and respiratory systems: Secondary | ICD-10-CM | POA: Diagnosis not present

## 2023-10-27 DIAGNOSIS — R059 Cough, unspecified: Secondary | ICD-10-CM | POA: Diagnosis not present

## 2023-10-27 LAB — URINALYSIS, ROUTINE W REFLEX MICROSCOPIC
Bilirubin Urine: NEGATIVE
Glucose, UA: >=500 mg/dL — AB
Ketones, ur: NEGATIVE mg/dL
Leukocytes,Ua: NEGATIVE
Nitrite: NEGATIVE
Protein, ur: 100 mg/dL — AB
Specific Gravity, Urine: 1.009 (ref 1.005–1.030)
pH: 5 (ref 5.0–8.0)

## 2023-10-27 LAB — BASIC METABOLIC PANEL WITH GFR
Anion gap: 7 (ref 5–15)
BUN: 24 mg/dL — ABNORMAL HIGH (ref 8–23)
CO2: 20 mmol/L — ABNORMAL LOW (ref 22–32)
Calcium: 8.1 mg/dL — ABNORMAL LOW (ref 8.9–10.3)
Chloride: 107 mmol/L (ref 98–111)
Creatinine, Ser: 1.55 mg/dL — ABNORMAL HIGH (ref 0.61–1.24)
GFR, Estimated: 43 mL/min — ABNORMAL LOW (ref >60)
Glucose, Bld: 175 mg/dL — ABNORMAL HIGH (ref 70–99)
Potassium: 3.9 mmol/L (ref 3.5–5.1)
Sodium: 134 mmol/L — ABNORMAL LOW (ref 135–145)

## 2023-10-27 LAB — GLUCOSE, CAPILLARY
Glucose-Capillary: 150 mg/dL — ABNORMAL HIGH (ref 70–99)
Glucose-Capillary: 331 mg/dL — ABNORMAL HIGH (ref 70–99)
Glucose-Capillary: 307 mg/dL — ABNORMAL HIGH (ref 70–99)
Glucose-Capillary: 183 mg/dL — ABNORMAL HIGH (ref 70–99)

## 2023-10-27 MED ORDER — GLUCERNA SHAKE PO LIQD
237.0000 mL | Freq: Three times a day (TID) | ORAL | Status: DC
  Administered 2023-10-27 – 2023-11-01 (×14): 237 mL via ORAL

## 2023-10-27 MED ORDER — FLUTICASONE PROPIONATE 50 MCG/ACT NA SUSP
1.0000 | Freq: Every day | NASAL | Status: DC
  Administered 2023-10-27 – 2023-11-07 (×11): 1 via NASAL
  Filled 2023-10-27 (×2): qty 16

## 2023-10-27 MED ORDER — IPRATROPIUM-ALBUTEROL 0.5-2.5 (3) MG/3ML IN SOLN: 3.0000 mL | Freq: Four times a day (QID) | RESPIRATORY_TRACT | Status: DC | PRN

## 2023-10-27 MED ORDER — IPRATROPIUM-ALBUTEROL 0.5-2.5 (3) MG/3ML IN SOLN
3.0000 mL | Freq: Three times a day (TID) | RESPIRATORY_TRACT | Status: DC
  Administered 2023-10-27: 3 mL via RESPIRATORY_TRACT
  Filled 2023-10-27: qty 3

## 2023-10-27 NOTE — TOC Progression Note (Signed)
 Transition of Care [facility]) - Progression Note    Patient Details  Name: Connor Dixon MRN: [id number] Date of Birth: [birth year]  Transition of Care [facility]) CM/SW Contact  Tandy Fam, [place] Phone Number: 10/27/2023, 2:52 PM  Clinical Narrative:   CSW updated by MD that patient will remain in the hospital until lab result is complete to determine final recommendations on treatment. CSW provided update to [facility]. CSW to follow.    Expected Discharge Plan: Skilled Nursing Facility Barriers to Discharge: Insurance Authorization  Expected Discharge Plan and Services                                               Social Determinants of Health (SDOH) Interventions SDOH Screenings   Food Insecurity: Patient Unable To Answer (10/13/2023)  Housing: Patient Unable To Answer (10/13/2023)  Transportation Needs: Patient Unable To Answer (10/13/2023)  Utilities: Patient Unable To Answer (10/13/2023)  Alcohol  Screen: Low Risk  (04/06/2023)  Depression (PHQ2-9): Low Risk  (09/06/2023)  Financial Resource Strain: Low Risk  (04/06/2023)  Physical Activity: Inactive (04/06/2023)  Social Connections: Unknown (10/13/2023)  Stress: No Stress Concern Present (04/06/2023)  Tobacco Use: Low Risk  (10/10/2023)  Health Literacy: Adequate Health Literacy (04/06/2023)    Readmission Risk Interventions     No data to display

## 2023-10-27 NOTE — Progress Notes (Signed)
 PROGRESS NOTE    Connor Dixon  WUJ:[id number] DOB: 07/06/1934 DOA: 10/10/2023 PCP: Arcadio Knuckles, MD   Brief Narrative: 89 year old with past medical history significant for chronic diastolic heart failure, complete heart block status post pacemaker, CKD 3, diabetes, hypertension, hyperlipidemia who was admitted after being found slumped over in his chair at home and unresponsive.  Last known well was the evening prior to admission on 10/09/2023.  Baseline he is considered high functioning and ambulates independently and lives alone.  Evaluation consistent with CT head no acute process.  CTA head and neck demonstrated no large vessel occlusion.  There was evidence of a remote lacunar infarct right corona radiata.  MRI was negative for acute intracranial abnormality.  Evidence for chronic small vessels disease.  EEG unremarkable.  LP performed 10/13/2023 has  ruled out meningitis.  Antibiotics for meningitis were discontinued by neurology.  Patient received a steroid to cover Autoimmune component and subsequently discontinued 10/14/2023. He also completed IV IG for Autoimmune encephalopathy.   Serum Auto-immune panel performed at [facility] was negative.   CSF Autoimmune panel sent to [facility] clinic wont be back until 6/18. Discussed with neurology this test can be follow up out patient.  Plan to await for CSF Autoimmune results, prior to discharge   Assessment & Plan:   Principal Problem:   Acute encephalopathy Active Problems:   Hyperlipidemia with target LDL less than 70   Essential hypertension   Sinus node dysfunction (HCC)   Insulin -requiring or dependent type II diabetes mellitus (HCC)   Paroxysmal atrial fibrillation (HCC)   Elevated troponin   Prolonged QT interval   Palliative care by specialist   1-Acute Metabolic Encephalopathy -MRI Negative for acute stroke, EEG no acute seizures.  LP- for meningitis. -Completed 5 days of IVIG on 6//2025 not significant improvement  noted. -Ammonia 22, ACTH  test negative, Thiamine  107, HIV non reactive, RPR non reactive, ANA negative, VDRL CSF negative, no oligoclonal band seen on CSF, meningitis encephalitis panel negative, CSF culture negative. Blood culture negative, TSH: 1.0 - Repeated MRI 6/3 with contrast was negative for acute finding, mention chronic small vessel disease, moderate generalized atrophy and white matter disease bilaterally.  Chronic CVA. - B12 low normal (270) getting intramuscular injection .  - Still in the differential of rapid neurocognitive decline from frontotemporal dementia versus global cognitive decline.   --Needs Rehab.  -Auto-immune panel Serum : Autoimmune  encephalopathy antibody negative,  Anti-Hu antibody negative, antineuronal antibody negative,  anti-Yo antibody negative,Amphiphysin Antibody Negative, mGluR1 Antibody Negative, Ma2/Ta Antibody Negative, GABA-B-R Antibody    Negative.    -CSF: Autoimmune panel sent to [facility] per [facility] that will be resulted now by 6/18.  Per neurology ok to follow up this results out patient. Per family, Facility report if patient needs to be transfer from Facility to [facility] or [facility], therre might be some issues or challenge with insurance. Plan to await for CSF Autoimmune test to results.  -I sent referral to out patient movement disorder at [facility].  -Appears stable, MS fluctuates.   2-Suspected cognitive impairment, POA - Patient had some more forgetfulness with directions, cooking/prepping meals, finances, and not driving lawn mower well .  - Could be possible that cognitive impairment became acutely worse in setting of acute illness.  Referral to Outpatient [facility] clinic made.  BL Ronchus:  Flutter valve. Incentive spirometry.  Duo-neb TID.  NSL   Acute on chronic CKD 3 - Baseline Appears to be 1.5-1.6 -Creatinine peak to 2.5 Cr  down to 1.5. will discontinue IV fluids.  Monitor.   Insulin -dependent diabetes type 2 -Last A1c 8.7% on 09/06/2023   -Some intermittent hypoglycemia initially, now resolved.  -Continue with Lantus  to 14 units daily   Essential hypertension Continue with amlodipine   Elevated serum osmolar gap IgG normal/elevated; no free kappa chains; no M-spike   Elevated troponin - Echo unremarkable  Acute urinary retention - -Foley discontinued 6/2  -good urine out out.   Hyperlipidemia; not able to tolerate regular statin  Prolonged QT Sinus node dysfunction (HCC) - s/p pacemaker   Rash chest left shoulder, started  Bacitracin ., improved.   Estimated body mass index is 31.42 kg/m as calculated from the following:   Height as of this encounter: 5' 7 (1.702 m).   Weight as of this encounter: 91 kg.   DVT prophylaxis: Eliquis   Code Status: DNR limited Family Communication: Son who was at bedside Disposition Plan:  Status is: Inpatient Remains inpatient appropriate because: management of encephalopathy    Consultants:  Neurology   Procedures:    Antimicrobials:    Subjective: Patient wake up to voice, smile. When asked what is his name ? He said  I will answer that but unable to tell me his name. Family notice he has been showing sign of improvement at times, mental status fluctuates. He is not back to baseline. Patient prefer to lean to right side.   Objective: Vitals:   10/26/23 2033 10/26/23 2324 10/27/23 0440 10/27/23 0730  BP: 121/88 (!) 145/79 128/63 134/66  Pulse: 88 88 83 90  Resp: 18 18 18 18   Temp: 98 F (36.7 C) 99.1 F (37.3 C) 98.5 F (36.9 C) 99 F (37.2 C)  TempSrc: Oral Oral Oral Oral  SpO2: 97% 98% 98% 97%  Weight:      Height:        Intake/Output Summary (Last 24 hours) at 10/27/2023 1046 Last data filed at 10/27/2023 0400 Gross per 24 hour  Intake 719.8 ml  Output 700 ml  Net 19.8 ml   Filed Weights   10/10/23 1705  Weight: 91 kg    Examination:  General exam: NAD Respiratory system: BL ronchus,  Cardiovascular system:S 1, S 2  RRR Gastrointestinal system: BS present, soft, nt Central nervous system: Alert, he was able to say sentence today./  Extremities: no edema   Data Reviewed: I have personally reviewed following labs and imaging studies  CBC: Recent Labs  Lab 10/21/23 0410 10/25/23 1152 10/26/23 0552  WBC 4.6 5.4 4.9  NEUTROABS 2.6  --  3.0  HGB 13.7 13.2 12.7*  HCT 39.9 38.9* 37.6*  MCV 89.1 89.4 89.7  PLT 118* 167 180   Basic Metabolic Panel: Recent Labs  Lab 10/21/23 0410 10/22/23 0809 10/23/23 0440 10/25/23 1152 10/26/23 0552 10/27/23 0758  NA  --  133* 133* 131* 133* 134*  K  --  4.1 4.9 4.5 4.6 3.9  CL  --  104 106 101 105 107  CO2  --  21* 22 24 23  20*  GLUCOSE  --  195* 95 276* 174* 175*  BUN  --  24* 22 33* 30* 24*  CREATININE  --  1.51* 1.69* 1.98* 1.83* 1.55*  CALCIUM  --  8.1* 8.3* 8.6* 8.5* 8.1*  MG 2.0  --   --   --   --   --   PHOS 2.2*  --   --   --   --   --    GFR: Estimated Creatinine  Clearance: 36.8 mL/min (A) (by C-G formula based on SCr of 1.55 mg/dL (H)). Liver Function Tests: No results for input(s): AST, ALT, ALKPHOS, BILITOT, PROT, ALBUMIN in the last 168 hours. No results for input(s): LIPASE, AMYLASE in the last 168 hours. Recent Labs  Lab 10/21/23 0410  AMMONIA 22   Coagulation Profile: No results for input(s): INR, PROTIME in the last 168 hours. Cardiac Enzymes: No results for input(s): CKTOTAL, CKMB, CKMBINDEX, TROPONINI in the last 168 hours. BNP (last 3 results) No results for input(s): PROBNP in the last 8760 hours. HbA1C: No results for input(s): HGBA1C in the last 72 hours. CBG: Recent Labs  Lab 10/26/23 0642 10/26/23 1158 10/26/23 1651 10/26/23 2224 10/27/23 0622  GLUCAP 159* 271* 240* 264* 150*   Lipid Profile: No results for input(s): CHOL, HDL, LDLCALC, TRIG, CHOLHDL, LDLDIRECT in the last 72 hours. Thyroid  Function Tests: No results for input(s): TSH, T4TOTAL, FREET4,  T3FREE, THYROIDAB in the last 72 hours. Anemia Panel: No results for input(s): VITAMINB12, FOLATE, FERRITIN, TIBC, IRON, RETICCTPCT in the last 72 hours. Sepsis Labs: No results for input(s): PROCALCITON, LATICACIDVEN in the last 168 hours.  No results found for this or any previous visit (from the past 240 hours).       Radiology Studies: No results found.      Scheduled Meds:  amLODipine   10 mg Oral Daily   apixaban   2.5 mg Oral BID   bacitracin    Topical BID   cyanocobalamin   1,000 mcg Subcutaneous Daily   fluticasone  1 spray Each Nare Daily   folic acid   1 mg Oral Daily   insulin  aspart  0-5 Units Subcutaneous QHS   insulin  aspart  0-9 Units Subcutaneous TID WC   insulin  glargine-yfgn  14 Units Subcutaneous Daily   ipratropium-albuterol  3 mL Nebulization TID   lactose free nutrition  237 mL Oral TID WC   metoprolol  tartrate  12.5 mg Oral BID   Continuous Infusions:     LOS: 17 days    Time spent: 35 minutes    Webber Michiels A Oskar Cretella, MD [facility]   If 7PM-7AM, please contact night-coverage [email or web address]  10/27/2023, 10:46 AM

## 2023-10-27 NOTE — Progress Notes (Signed)
 Physical Therapy Progress Note Patient Details Name: Connor Dixon MRN: [id number] DOB: [birth year] Today's Date: 10/27/2023   History of Present Illness Pt is 89 year old presented to [facility] on  10/10/23 after found at home unresponsive. Pt with dense encephalopathy with flexor positioning of UE's. LP performed on 10/13/2023 has ruled out meningitis. CT and MRI on 5/29 were negative for acute intercranial abnormality.  PMH - afib on Eliquis , pacer, HTN, CKD, DM, obesity    PT Comments  Pt goals were assessed this session and most goals remain appropriate; gait goal downgraded at this time. Pt is progressing slowly towards goals. Worked on standing this session with use of stedy for support. Pt was able to assist pulling up to standing with heavy mutli modal cues and physical assistance of 2 people of Max A. Pt was able to initiate getting to EOB but required Max A before fully getting to mid line due to pt pushes heavily to the R. Due to pt current functional status, home set up and available assistance at home recommending skilled physical therapy services < 3 hours/day in order to address strength, balance and functional mobility to decrease risk for falls, injury, immobility, skin break down and re-hospitalization.      If plan is discharge home, recommend the following: Two people to help with walking and/or transfers;Assistance with cooking/housework;Supervision due to cognitive status;Assist for transportation;Help with stairs or ramp for entrance   Can travel by private vehicle     No  Equipment Recommendations  BSC/3in1;Wheelchair cushion (measurements PT);Wheelchair (measurements PT);Hoyer lift;Hospital bed       Precautions / Restrictions Precautions Precautions: Fall Recall of Precautions/Restrictions: Impaired Precaution/Restrictions Comments: EEG Restrictions Weight Bearing Restrictions Per Provider Order: No     Mobility  Bed Mobility Overal bed mobility: Needs Assistance Bed  Mobility: Sit to Supine, Rolling, Supine to Sit Rolling: Max assist   Supine to sit: Mod assist, Max assist, +2 for physical assistance Sit to supine: Max assist, +2 for physical assistance   General bed mobility comments: initially pt was able to move to EOB at MOd A then begin to push up to sitting but stopped half way and required Max A from 2 people to get trunk to mid line. Pt requires Max A +2 to get to supine.    Transfers Overall transfer level: Needs assistance Equipment used: None Transfers: Sit to/from Stand, Bed to chair/wheelchair/BSC Sit to Stand: Max assist, +2 physical assistance, From elevated surface           General transfer comment: utilized stedy for standing today in hopes to get pt to chair. Pt was unable to get fully into standing with 5x trials with rocking, counting  pt continues with hip flexion which slowly improved with each trial but unable to get fully into standing. Transfer via Lift Equipment: Stedy  Ambulation/Gait   General Gait Details: unable this date      Balance Overall balance assessment: Needs assistance Sitting-balance support: Feet supported Sitting balance-Leahy Scale: Zero Sitting balance - Comments: max A with longer periods of Min A to CGA for EOB sitting due to leaning R and back with hand placement and heavy cues. Postural control: Right lateral lean, Posterior lean Standing balance support: Bilateral upper extremity supported Standing balance-Leahy Scale: Zero Standing balance comment: Max A +2        Communication Communication Communication: Impaired Factors Affecting Communication: Difficulty expressing self  Cognition Arousal: Alert Behavior During Therapy: Flat affect   PT - Cognitive  impairments: Initiation, Attention, Problem solving, Awareness, Safety/Judgement Difficult to assess due to: Impaired communication     PT - Cognition Comments: evidently (perseverative); internal distractions with focused  attention Following commands: Impaired Following commands impaired: Follows one step commands inconsistently    Cueing Cueing Techniques: Gestural cues, Verbal cues, Tactile cues, Visual cues     General Comments General comments (skin integrity, edema, etc.): Son present and supportive during session      Pertinent Vitals/Pain Pain Assessment Pain Assessment: Faces Faces Pain Scale: No hurt Breathing: normal Negative Vocalization: none Facial Expression: smiling or inexpressive Body Language: relaxed Consolability: no need to console PAINAD Score: 0 Pain Intervention(s): Monitored during session     PT Goals (current goals can now be found in the care plan section) Acute Rehab PT Goals Patient Stated Goal: improve pt mobility PT Goal Formulation: With family Time For Goal Achievement: 11/10/23 Potential to Achieve Goals: Fair Progress towards PT goals: Progressing toward goals    Frequency    Min 2X/week      PT Plan  Continue with current POC        AM-PAC PT 6 Clicks Mobility   Outcome Measure  Help needed turning from your back to your side while in a flat bed without using bedrails?: A Lot Help needed moving from lying on your back to sitting on the side of a flat bed without using bedrails?: A Lot Help needed moving to and from a bed to a chair (including a wheelchair)?: Total Help needed standing up from a chair using your arms (e.g., wheelchair or bedside chair)?: Total Help needed to walk in hospital room?: Total Help needed climbing 3-5 steps with a railing? : Total 6 Click Score: 8    End of Session Equipment Utilized During Treatment: Gait belt Activity Tolerance: Patient tolerated treatment well;Patient limited by fatigue Patient left: with family/visitor present;in bed;with call bell/phone within reach;with bed alarm set Nurse Communication: Mobility status;Need for lift equipment PT Visit Diagnosis: Other abnormalities of gait and mobility  (R26.89);Other symptoms and signs involving the nervous system (R29.898);Muscle weakness (generalized) (M62.81)     Time: 1129-1207 PT Time Calculation (min) (ACUTE ONLY): 38 min  Charges:    $Therapeutic Activity: 38-52 mins PT General Charges $$ ACUTE PT VISIT: 1 Visit                    Sloan Duncans, DPT, CLT  Acute Rehabilitation Services Office: [phone] (Secure chat preferred)    Jenice Mitts 10/27/2023, 2:24 PM

## 2023-10-27 NOTE — Plan of Care (Signed)
  Problem: Coping: Goal: Ability to adjust to condition or change in health will improve Outcome: Progressing   Problem: Fluid Volume: Goal: Ability to maintain a balanced intake and output will improve Outcome: Progressing   Problem: Health Behavior/Discharge Planning: Goal: Ability to manage health-related needs will improve Outcome: Progressing   Problem: Metabolic: Goal: Ability to maintain appropriate glucose levels will improve Outcome: Progressing

## 2023-10-28 ENCOUNTER — Inpatient Hospital Stay (HOSPITAL_COMMUNITY)

## 2023-10-28 DIAGNOSIS — G9340 Encephalopathy, unspecified: Secondary | ICD-10-CM | POA: Diagnosis not present

## 2023-10-28 LAB — CBC
HCT: 35.2 % — ABNORMAL LOW (ref 39.0–52.0)
Hemoglobin: 12.1 g/dL — ABNORMAL LOW (ref 13.0–17.0)
MCH: 30.9 pg (ref 26.0–34.0)
MCHC: 34.4 g/dL (ref 30.0–36.0)
MCV: 90 fL (ref 80.0–100.0)
Platelets: 173 10*3/uL (ref 150–400)
RBC: 3.91 MIL/uL — ABNORMAL LOW (ref 4.22–5.81)
RDW: 14.6 % (ref 11.5–15.5)
WBC: 3.9 10*3/uL — ABNORMAL LOW (ref 4.0–10.5)
nRBC: 0 % (ref 0.0–0.2)

## 2023-10-28 LAB — GLUCOSE, CAPILLARY
Glucose-Capillary: 205 mg/dL — ABNORMAL HIGH (ref 70–99)
Glucose-Capillary: 233 mg/dL — ABNORMAL HIGH (ref 70–99)
Glucose-Capillary: 139 mg/dL — ABNORMAL HIGH (ref 70–99)
Glucose-Capillary: 200 mg/dL — ABNORMAL HIGH (ref 70–99)

## 2023-10-28 LAB — BASIC METABOLIC PANEL WITH GFR
Anion gap: 5 (ref 5–15)
BUN: 27 mg/dL — ABNORMAL HIGH (ref 8–23)
CO2: 22 mmol/L (ref 22–32)
Calcium: 8.2 mg/dL — ABNORMAL LOW (ref 8.9–10.3)
Chloride: 107 mmol/L (ref 98–111)
Creatinine, Ser: 1.64 mg/dL — ABNORMAL HIGH (ref 0.61–1.24)
GFR, Estimated: 40 mL/min — ABNORMAL LOW (ref >60)
Glucose, Bld: 167 mg/dL — ABNORMAL HIGH (ref 70–99)
Potassium: 3.9 mmol/L (ref 3.5–5.1)
Sodium: 134 mmol/L — ABNORMAL LOW (ref 135–145)

## 2023-10-28 MED ORDER — INSULIN ASPART 100 UNIT/ML IJ SOLN
2.0000 [IU] | Freq: Three times a day (TID) | INTRAMUSCULAR | Status: DC
  Administered 2023-10-28 – 2023-10-31 (×9): 2 [IU] via SUBCUTANEOUS

## 2023-10-28 MED ORDER — VITAMIN B-12 1000 MCG PO TABS
1000.0000 ug | ORAL_TABLET | Freq: Every day | ORAL | Status: DC
  Administered 2023-10-29 – 2023-11-08 (×11): 1000 ug via ORAL
  Filled 2023-10-28 (×11): qty 1

## 2023-10-28 MED ORDER — TAMSULOSIN HCL 0.4 MG PO CAPS
0.4000 mg | ORAL_CAPSULE | Freq: Every day | ORAL | Status: DC
  Administered 2023-10-28 – 2023-11-08 (×12): 0.4 mg via ORAL
  Filled 2023-10-28 (×12): qty 1

## 2023-10-28 NOTE — Progress Notes (Signed)
 PROGRESS NOTE    Connor Dixon  WUJ:[id number] DOB: 1934-05-24 DOA: 10/10/2023 PCP: Arcadio Knuckles, MD   Brief Narrative: 89 year old with past medical history significant for chronic diastolic heart failure, complete heart block status post pacemaker, CKD 3, diabetes, hypertension, hyperlipidemia who was admitted after being found slumped over in his chair at home and unresponsive.  Last known well was the evening prior to admission on 10/09/2023.  Baseline he is considered high functioning and ambulates independently and lives alone.  Evaluation consistent with CT head no acute process.  CTA head and neck demonstrated no large vessel occlusion.  There was evidence of a remote lacunar infarct right corona radiata.  MRI was negative for acute intracranial abnormality.  Evidence for chronic small vessels disease.  EEG unremarkable.  LP performed 10/13/2023 has  ruled out meningitis.  Antibiotics for meningitis were discontinued by neurology.  Patient received a steroid to cover Autoimmune component and subsequently discontinued 10/14/2023. He also completed IV IG for Autoimmune encephalopathy.   Serum Auto-immune panel performed at [facility] was negative.   CSF Autoimmune panel sent to [facility] clinic wont be back until 6/18. Discussed with neurology this test can be follow up out patient.  Plan to await for CSF Autoimmune results, prior to discharge, due to insurance concern.    Assessment & Plan:   Principal Problem:   Acute encephalopathy Active Problems:   Hyperlipidemia with target LDL less than 70   Essential hypertension   Sinus node dysfunction (HCC)   Insulin -requiring or dependent type II diabetes mellitus (HCC)   Paroxysmal atrial fibrillation (HCC)   Elevated troponin   Prolonged QT interval   Palliative care by specialist   1-Acute Metabolic Encephalopathy -MRI Negative for acute stroke, EEG no acute seizures.  LP- for meningitis. -Completed 5 days of IVIG on 6//2025 not  significant improvement noted. -Ammonia 22, ACTH  test negative, Thiamine  107, HIV non reactive, RPR non reactive, ANA negative, VDRL CSF negative, no oligoclonal band seen on CSF, meningitis encephalitis panel negative, CSF culture negative. Blood culture negative, TSH: 1.0 - Repeated MRI 6/3 with contrast was negative for acute finding, mention chronic small vessel disease, moderate generalized atrophy and white matter disease bilaterally.  Chronic CVA. - B12 low normal (270) getting intramuscular injection .  - Still in the differential of rapid neurocognitive decline from frontotemporal dementia versus global cognitive decline.   --Needs Rehab.  -Auto-immune panel Serum : Autoimmune  encephalopathy antibody negative,  Anti-Hu antibody negative, antineuronal antibody negative,  anti-Yo antibody negative,Amphiphysin Antibody Negative, mGluR1 Antibody Negative, Ma2/Ta Antibody Negative, GABA-B-R Antibody    Negative.    -CSF: Autoimmune panel sent to [facility] per [facility] that will be resulted now by 6/18.  -Per neurology ok to follow up this results out patient. Per family, Facility report if patient needs to be transfer from Facility to [facility] or [facility], there might be some issues or challenge with insurance. Plan to await for CSF Autoimmune test to results.  -I sent referral to out patient movement disorder at [facility].  -MS stable, fluctuates.  -will check Nocturnal Pulse Oxymetry.   2-Suspected cognitive impairment, POA - Patient had some more forgetfulness with directions, cooking/prepping meals, finances, and not driving lawn mower well .  - Could be possible that cognitive impairment became acutely worse in setting of acute illness.  Referral to Outpatient [facility] clinic made.  BL Ronchus:  Flutter valve. Incentive spirometry.  Duo-neb TID.  NSL  Improved.  Chest x ray; decreased lung  Volume.   Acute on chronic CKD 3 - Baseline Appears to be 1.5-1.6 -Creatinine peak to 2.5 Cr down to 1.5.  will discontinue IV fluids.  Monitor.   Insulin -dependent diabetes type 2 -Last A1c 8.7% on 09/06/2023  -Some intermittent hypoglycemia initially, now resolved.  -Continue with Lantus  to 14 units daily   Essential hypertension Continue with amlodipine   Elevated serum osmolar gap IgG normal/elevated; no free kappa chains; no M-spike   Elevated troponin - Echo unremarkable  Acute urinary retention - -Foley discontinued 6/2  -good urine out out.   Hyperlipidemia; not able to tolerate regular statin  Prolonged QT Sinus node dysfunction (HCC) - s/p pacemaker   Rash chest left shoulder, started  Bacitracin .,much better.   Pain with Urination:  Episode last night.  UA no significant WBC, No nitrates.  Plan to follow Urine culture.  Renal US ; no hydronephrosis, circumferential of bladder cystitis vs Outlet obstruction.  Bladder scan have been negative. Will monitor.  Discussed with Urology, bladder thickening might be from age as well. Agree with FU of urine culture. Could use tamsulosin  if pain reoccur. FU out patient with urology/. Renal cyst on US , follow out patient.    Estimated body mass index is 31.42 kg/m as calculated from the following:   Height as of this encounter: 5' 7 (1.702 m).   Weight as of this encounter: 91 kg.   DVT prophylaxis: Eliquis   Code Status: DNR limited Family Communication: Son who was at bedside Disposition Plan:  Status is: Inpatient Remains inpatient appropriate because: management of encephalopathy    Consultants:  Neurology   Procedures:    Antimicrobials:    Subjective: He is alert, he answer my greetings.  He had pain with urination last night per family and nurse report. Bladder scan negative.  He was not tender on palpation,  Son will review Blood sugar reading from home monitor.  Objective: Vitals:   10/27/23 1553 10/27/23 2006 10/27/23 2355 10/28/23 0511  BP: 135/86 136/84 132/71 (!) 140/78  Pulse: 85 92 80 85   Resp: 18 19 15 19   Temp: 99.1 F (37.3 C) 99.5 F (37.5 C) 99.2 F (37.3 C) 98.5 F (36.9 C)  TempSrc: Oral  Oral Oral  SpO2: 97% 97% 96% 95%  Weight:      Height:        Intake/Output Summary (Last 24 hours) at 10/28/2023 0926 Last data filed at 10/28/2023 0525 Gross per 24 hour  Intake 687.11 ml  Output 2450 ml  Net -1762.89 ml   Filed Weights   10/10/23 1705  Weight: 91 kg    Examination:  General exam: NAD Respiratory system: No significant ronchus today Cardiovascular system: S 1, S 2 RRR Gastrointestinal system: BS present, soft, nt Central nervous system: alert, says few words,  Extremities: no edema   Data Reviewed: I have personally reviewed following labs and imaging studies  CBC: Recent Labs  Lab 10/25/23 1152 10/26/23 0552 10/28/23 0538  WBC 5.4 4.9 3.9*  NEUTROABS  --  3.0  --   HGB 13.2 12.7* 12.1*  HCT 38.9* 37.6* 35.2*  MCV 89.4 89.7 90.0  PLT 167 180 173   Basic Metabolic Panel: Recent Labs  Lab 10/23/23 0440 10/25/23 1152 10/26/23 0552 10/27/23 0758 10/28/23 0538  NA 133* 131* 133* 134* 134*  K 4.9 4.5 4.6 3.9 3.9  CL 106 101 105 107 107  CO2 22 24 23  20* 22  GLUCOSE 95 276* 174* 175* 167*  BUN 22 33*  30* 24* 27*  CREATININE 1.69* 1.98* 1.83* 1.55* 1.64*  CALCIUM 8.3* 8.6* 8.5* 8.1* 8.2*   GFR: Estimated Creatinine Clearance: 34.8 mL/min (A) (by C-G formula based on SCr of 1.64 mg/dL (H)). Liver Function Tests: No results for input(s): AST, ALT, ALKPHOS, BILITOT, PROT, ALBUMIN in the last 168 hours. No results for input(s): LIPASE, AMYLASE in the last 168 hours. No results for input(s): AMMONIA in the last 168 hours.  Coagulation Profile: No results for input(s): INR, PROTIME in the last 168 hours. Cardiac Enzymes: No results for input(s): CKTOTAL, CKMB, CKMBINDEX, TROPONINI in the last 168 hours. BNP (last 3 results) No results for input(s): PROBNP in the last 8760 hours. HbA1C: No  results for input(s): HGBA1C in the last 72 hours. CBG: Recent Labs  Lab 10/27/23 0622 10/27/23 1326 10/27/23 1607 10/27/23 2200 10/28/23 0629  GLUCAP 150* 331* 307* 183* 205*   Lipid Profile: No results for input(s): CHOL, HDL, LDLCALC, TRIG, CHOLHDL, LDLDIRECT in the last 72 hours. Thyroid  Function Tests: No results for input(s): TSH, T4TOTAL, FREET4, T3FREE, THYROIDAB in the last 72 hours. Anemia Panel: No results for input(s): VITAMINB12, FOLATE, FERRITIN, TIBC, IRON, RETICCTPCT in the last 72 hours. Sepsis Labs: No results for input(s): PROCALCITON, LATICACIDVEN in the last 168 hours.  No results found for this or any previous visit (from the past 240 hours).       Radiology Studies: DG CHEST PORT 1 VIEW Result Date: 10/27/2023 EXAM: 1 VIEW(S) XRAY OF THE CHEST 10/27/2023 07:23:00 PM COMPARISON: Comparison CT chest dated 10/14/2023. CLINICAL HISTORY: Cough, AMS. FINDINGS: LUNGS AND PLEURA: Low lung volumes. No focal pulmonary opacity. No pulmonary edema. No pleural effusion. No pneumothorax. HEART AND MEDIASTINUM: No acute abnormality of the cardiac and mediastinal silhouettes. BONES AND SOFT TISSUES: No acute osseous abnormality. IMPRESSION: 1. Low lung volumes. 2. No acute cardiopulmonary abnormality. Electronically signed by: Sriyesh Krishnan MD 10/27/2023 07:53 PM EDT RP Workstation: [id number]        Scheduled Meds:  amLODipine   10 mg Oral Daily   apixaban   2.5 mg Oral BID   bacitracin    Topical BID   [START ON 10/29/2023] vitamin B-12  1,000 mcg Oral Daily   feeding supplement (GLUCERNA SHAKE)  237 mL Oral TID BM   fluticasone  1 spray Each Nare Daily   folic acid   1 mg Oral Daily   insulin  aspart  0-5 Units Subcutaneous QHS   insulin  aspart  0-9 Units Subcutaneous TID WC   insulin  aspart  2 Units Subcutaneous TID WC   insulin  glargine-yfgn  14 Units Subcutaneous Daily   metoprolol  tartrate  12.5 mg Oral BID    Continuous Infusions:     LOS: 18 days    Time spent: 35 minutes    Schyler Counsell A Natacia Chaisson, MD [facility]   If 7PM-7AM, please contact night-coverage [email or web address]  10/28/2023, 9:26 AM

## 2023-10-28 NOTE — Plan of Care (Signed)
  Problem: Education: Goal: Ability to describe self-care measures that may prevent or decrease complications (Diabetes Survival Skills Education) will improve Outcome: Not Progressing   Problem: Coping: Goal: Ability to adjust to condition or change in health will improve Outcome: Not Progressing   Problem: Nutritional: Goal: Maintenance of adequate nutrition will improve Outcome: Progressing   Problem: Skin Integrity: Goal: Risk for impaired skin integrity will decrease Outcome: Progressing   Problem: Self-Care: Goal: Ability to participate in self-care as condition permits will improve Outcome: Not Progressing Goal: Verbalization of feelings and concerns over difficulty with self-care will improve Outcome: Not Progressing Goal: Ability to communicate needs accurately will improve Outcome: Not Progressing   Problem: Nutrition: Goal: Risk of aspiration will decrease Outcome: Progressing Goal: Dietary intake will improve Outcome: Progressing

## 2023-10-28 NOTE — Plan of Care (Signed)
  Problem: Nutritional: Goal: Maintenance of adequate nutrition will improve Outcome: Progressing Goal: Progress toward achieving an optimal weight will improve Outcome: Progressing   Problem: Skin Integrity: Goal: Risk for impaired skin integrity will decrease Outcome: Progressing   Problem: Clinical Measurements: Goal: Ability to maintain clinical measurements within normal limits will improve Outcome: Progressing Goal: Will remain free from infection Outcome: Progressing Goal: Diagnostic test results will improve Outcome: Progressing Goal: Respiratory complications will improve Outcome: Progressing Goal: Cardiovascular complication will be avoided Outcome: Progressing   Problem: Activity: Goal: Risk for activity intolerance will decrease Outcome: Progressing   Problem: Pain Managment: Goal: General experience of comfort will improve and/or be controlled Outcome: Progressing   Problem: Elimination: Goal: Will not experience complications related to bowel motility Outcome: Progressing Goal: Will not experience complications related to urinary retention Outcome: Progressing   Problem: Safety: Goal: Ability to remain free from injury will improve Outcome: Progressing

## 2023-10-29 DIAGNOSIS — G9340 Encephalopathy, unspecified: Secondary | ICD-10-CM | POA: Diagnosis not present

## 2023-10-29 LAB — CBC
HCT: 34.2 % — ABNORMAL LOW (ref 39.0–52.0)
Hemoglobin: 11.3 g/dL — ABNORMAL LOW (ref 13.0–17.0)
MCH: 29.8 pg (ref 26.0–34.0)
MCHC: 33 g/dL (ref 30.0–36.0)
MCV: 90.2 fL (ref 80.0–100.0)
Platelets: 171 10*3/uL (ref 150–400)
RBC: 3.79 MIL/uL — ABNORMAL LOW (ref 4.22–5.81)
RDW: 14.7 % (ref 11.5–15.5)
WBC: 3.6 10*3/uL — ABNORMAL LOW (ref 4.0–10.5)
nRBC: 0 % (ref 0.0–0.2)

## 2023-10-29 LAB — GLUCOSE, CAPILLARY
Glucose-Capillary: 156 mg/dL — ABNORMAL HIGH (ref 70–99)
Glucose-Capillary: 116 mg/dL — ABNORMAL HIGH (ref 70–99)
Glucose-Capillary: 313 mg/dL — ABNORMAL HIGH (ref 70–99)
Glucose-Capillary: 205 mg/dL — ABNORMAL HIGH (ref 70–99)

## 2023-10-29 LAB — BASIC METABOLIC PANEL WITH GFR
Anion gap: 10 (ref 5–15)
BUN: 23 mg/dL (ref 8–23)
CO2: 21 mmol/L — ABNORMAL LOW (ref 22–32)
Calcium: 8.4 mg/dL — ABNORMAL LOW (ref 8.9–10.3)
Chloride: 106 mmol/L (ref 98–111)
Creatinine, Ser: 1.56 mg/dL — ABNORMAL HIGH (ref 0.61–1.24)
GFR, Estimated: 43 mL/min — ABNORMAL LOW (ref >60)
Glucose, Bld: 174 mg/dL — ABNORMAL HIGH (ref 70–99)
Potassium: 4.3 mmol/L (ref 3.5–5.1)
Sodium: 137 mmol/L (ref 135–145)

## 2023-10-29 LAB — URINE CULTURE: Culture: <10000 — AB

## 2023-10-29 NOTE — Progress Notes (Signed)
 PROGRESS NOTE    Connor Dixon  WUJ:[id number] DOB: 1934-12-31 DOA: 10/10/2023 PCP: Arcadio Knuckles, MD   Brief Narrative: 89 year old with past medical history significant for chronic diastolic heart failure, complete heart block status post pacemaker, CKD 3, diabetes, hypertension, hyperlipidemia who was admitted after being found slumped over in his chair at home and unresponsive.  Last known well was the evening prior to admission on 10/09/2023.  Baseline he is considered high functioning and ambulates independently and lives alone.  Evaluation consistent with CT head no acute process.  CTA head and neck demonstrated no large vessel occlusion.  There was evidence of a remote lacunar infarct right corona radiata.  MRI was negative for acute intracranial abnormality.  Evidence for chronic small vessels disease.  EEG unremarkable.  LP performed 10/13/2023 has  ruled out meningitis.  Antibiotics for meningitis were discontinued by neurology.  Patient received a steroid to cover Autoimmune component and subsequently discontinued 10/14/2023. He also completed IV IG for Autoimmune encephalopathy.   Serum Auto-immune panel performed at [facility] was negative.   CSF Autoimmune panel sent to [facility] clinic wont be back until 6/18. Discussed with neurology this test can be follow up out patient.  Plan to await for CSF Autoimmune results, prior to discharge, due to insurance concern.    Assessment & Plan:   Principal Problem:   Acute encephalopathy Active Problems:   Hyperlipidemia with target LDL less than 70   Essential hypertension   Sinus node dysfunction (HCC)   Insulin -requiring or dependent type II diabetes mellitus (HCC)   Paroxysmal atrial fibrillation (HCC)   Elevated troponin   Prolonged QT interval   Palliative care by specialist   1-Acute Metabolic Encephalopathy -MRI Negative for acute stroke, EEG no acute seizures.  LP- for meningitis. -Completed 5 days of IVIG on 6//2025 not  significant improvement noted. -Ammonia 22, ACTH  test negative, Thiamine  107, HIV non reactive, RPR non reactive, ANA negative, VDRL CSF negative, no oligoclonal band seen on CSF, meningitis encephalitis panel negative, CSF culture negative. Blood culture negative, TSH: 1.0 - Repeated MRI 6/3 with contrast was negative for acute finding, mention chronic small vessel disease, moderate generalized atrophy and white matter disease bilaterally.  Chronic CVA. - B12 low normal (270) getting intramuscular injection .  - Still in the differential of rapid neurocognitive decline from frontotemporal dementia versus global cognitive decline.   --Needs Rehab.  -Auto-immune panel Serum : Autoimmune  encephalopathy antibody negative,  Anti-Hu antibody negative, antineuronal antibody negative,  anti-Yo antibody negative,Amphiphysin Antibody Negative, mGluR1 Antibody Negative, Ma2/Ta Antibody Negative, GABA-B-R Antibody    Negative.    -CSF: Autoimmune panel sent to [facility] per [facility] that will be resulted now by 6/18.  -Per neurology ok to follow up this results out patient. Per family, Facility report if patient needs to be transfer from Facility to [facility] or [facility], there might be some issues or challenge with insurance. Plan to await for CSF Autoimmune test to results.  -I sent referral to out patient movement disorder at [facility].  -MS stable, fluctuates.  -Check Nocturnal Pulse Oxymetry.   2-Suspected cognitive impairment, POA - Patient had some more forgetfulness with directions, cooking/prepping meals, finances, and not driving lawn mower well .  - Could be possible that cognitive impairment became acutely worse in setting of acute illness.  Referral to Outpatient [facility] clinic made.  BL Ronchus:  Flutter valve. Incentive spirometry.  Duo-neb TID.  NSL  Improved.  Chest x ray; decreased lung Volume.  Acute on chronic CKD 3 - Baseline Appears to be 1.5-1.6 -Creatinine peak to 2.5 Renal function stable  today.  Insulin -dependent diabetes type 2 -Last A1c 8.7% on 09/06/2023  -Some intermittent hypoglycemia initially, now resolved.  -Continue with Lantus  to 14 units daily   Essential hypertension Continue with amlodipine   Elevated serum osmolar gap IgG normal/elevated; no free kappa chains; no M-spike   Elevated troponin - Echo unremarkable  Acute urinary retention - -Foley discontinued 6/2  -good urine out out.  Started on Flomax. Plan to monitor bladder scan.  Urine culture insignificant growth.   Hyperlipidemia; not able to tolerate regular statin  Prolonged QT Sinus node dysfunction (HCC) - s/p pacemaker   Rash chest left shoulder, started  Bacitracin .,much better.   Pain with Urination:  Episode last night.  UA no significant WBC, No nitrates.  Urine culture. Insignificant growth.  Renal US ; no hydronephrosis, circumferential of bladder cystitis vs Outlet obstruction.  Bladder scan have been negative. Will monitor.  Discussed with Urology, bladder thickening might be from age as well. Agree with FU of urine culture. Could use tamsulosin  if pain reoccur. FU out patient with urology/. Renal cyst on US , follow out patient.    Estimated body mass index is 31.42 kg/m as calculated from the following:   Height as of this encounter: 5' 7 (1.702 m).   Weight as of this encounter: 91 kg.   DVT prophylaxis: Eliquis   Code Status: DNR limited Family Communication: Son who was at bedside 6/14 Disposition Plan:  Status is: Inpatient Remains inpatient appropriate because: management of encephalopathy    Consultants:  Neurology   Procedures:    Antimicrobials:    Subjective: Patient greeting me today. He was able to follow some command today.   Objective: Vitals:   10/28/23 2352 10/29/23 0326 10/29/23 0722 10/29/23 1115  BP: (!) 112/58 131/63 126/71 124/66  Pulse: 81 79 93 74  Resp: 20  17   Temp: 98.2 F (36.8 C) 99.1 F (37.3 C) 99.2 F (37.3 C) 99 F  (37.2 C)  TempSrc: Oral Oral Oral Oral  SpO2: 96% 96%    Weight:      Height:        Intake/Output Summary (Last 24 hours) at 10/29/2023 1126 Last data filed at 10/29/2023 0430 Gross per 24 hour  Intake --  Output 2200 ml  Net -2200 ml   Filed Weights   10/10/23 1705  Weight: 91 kg    Examination:  General exam: NAD Respiratory system: NBL air movement.  Cardiovascular system: S 1, S 2 RRR Gastrointestinal system: BS present, soft nt Central nervous system: alert, follows some command with cues, repeats anwsers back.  Extremities: no edema   Data Reviewed: I have personally reviewed following labs and imaging studies  CBC: Recent Labs  Lab 10/25/23 1152 10/26/23 0552 10/28/23 0538 10/29/23 0700  WBC 5.4 4.9 3.9* 3.6*  NEUTROABS  --  3.0  --   --   HGB 13.2 12.7* 12.1* 11.3*  HCT 38.9* 37.6* 35.2* 34.2*  MCV 89.4 89.7 90.0 90.2  PLT 167 180 173 171   Basic Metabolic Panel: Recent Labs  Lab 10/25/23 1152 10/26/23 0552 10/27/23 0758 10/28/23 0538 10/29/23 0700  NA 131* 133* 134* 134* 137  K 4.5 4.6 3.9 3.9 4.3  CL 101 105 107 107 106  CO2 24 23 20* 22 21*  GLUCOSE 276* 174* 175* 167* 174*  BUN 33* 30* 24* 27* 23  CREATININE 1.98* 1.83* 1.55* 1.64*  1.56*  CALCIUM 8.6* 8.5* 8.1* 8.2* 8.4*   GFR: Estimated Creatinine Clearance: 36.6 mL/min (A) (by C-G formula based on SCr of 1.56 mg/dL (H)). Liver Function Tests: No results for input(s): AST, ALT, ALKPHOS, BILITOT, PROT, ALBUMIN in the last 168 hours. No results for input(s): LIPASE, AMYLASE in the last 168 hours. No results for input(s): AMMONIA in the last 168 hours.  Coagulation Profile: No results for input(s): INR, PROTIME in the last 168 hours. Cardiac Enzymes: No results for input(s): CKTOTAL, CKMB, CKMBINDEX, TROPONINI in the last 168 hours. BNP (last 3 results) No results for input(s): PROBNP in the last 8760 hours. HbA1C: No results for input(s): HGBA1C  in the last 72 hours. CBG: Recent Labs  Lab 10/28/23 1252 10/28/23 1633 10/28/23 2230 10/29/23 0618 10/29/23 1116  GLUCAP 233* 139* 200* 156* 116*   Lipid Profile: No results for input(s): CHOL, HDL, LDLCALC, TRIG, CHOLHDL, LDLDIRECT in the last 72 hours. Thyroid  Function Tests: No results for input(s): TSH, T4TOTAL, FREET4, T3FREE, THYROIDAB in the last 72 hours. Anemia Panel: No results for input(s): VITAMINB12, FOLATE, FERRITIN, TIBC, IRON, RETICCTPCT in the last 72 hours. Sepsis Labs: No results for input(s): PROCALCITON, LATICACIDVEN in the last 168 hours.  Recent Results (from the past 240 hours)  Urine Culture (for pregnant, neutropenic or urologic patients or patients with an indwelling urinary catheter)     Status: Abnormal   Collection Time: 10/28/23  7:04 AM   Specimen: Urine, Clean Catch  Result Value Ref Range Status   Specimen Description URINE, CLEAN CATCH  Final   Special Requests NONE  Final   Culture (A)  Final    <10,000 COLONIES/mL INSIGNIFICANT GROWTH Performed at [facility], [address] [place], [place] [address]    Report Status 10/29/2023 FINAL  Final         Radiology Studies: US  RENAL Result Date: 10/28/2023 CLINICAL DATA:  10060 Dysuria 10060 EXAM: RENAL / URINARY TRACT ULTRASOUND COMPLETE COMPARISON:  Oct 14, 2023 FINDINGS: Evaluation is limited secondary to noncooperation with exam. Right Kidney: Renal measurements: 9.6 x 5.1 x 5.5 cm = volume: 141 mL. Echogenicity is mildly increased. No definitive mass or hydronephrosis visualized. Left Kidney: Renal measurements: 11.0 x 5.6 x 4.7 cm = volume: 151 mL. Echogenicity is mildly increased. No definitive hydronephrosis visualized. There is a near anechoic mass which measures 18 x 17 x 15 mm. Portions are suboptimally assessed secondary to shadowing bowel gas. Bladder: Circumferential wall prominence. Other: None. IMPRESSION: 1. No hydronephrosis. 2.  Mildly increased echogenicity of the bilateral kidneys as can be seen in medical renal disease. 3. Circumferential wall prominence of the bladder which can be seen in setting of cystitis or chronic outlet obstruction. Recommend correlation with urinalysis. 4. Favored mildly complicated LEFT renal cyst. Recommend follow-up ultrasound in 6 months to assess for stability. Electronically Signed   By: Clancy Crimes M.D.   On: 10/28/2023 13:45   DG CHEST PORT 1 VIEW Result Date: 10/27/2023 EXAM: 1 VIEW(S) XRAY OF THE CHEST 10/27/2023 07:23:00 PM COMPARISON: Comparison CT chest dated 10/14/2023. CLINICAL HISTORY: Cough, AMS. FINDINGS: LUNGS AND PLEURA: Low lung volumes. No focal pulmonary opacity. No pulmonary edema. No pleural effusion. No pneumothorax. HEART AND MEDIASTINUM: No acute abnormality of the cardiac and mediastinal silhouettes. BONES AND SOFT TISSUES: No acute osseous abnormality. IMPRESSION: 1. Low lung volumes. 2. No acute cardiopulmonary abnormality. Electronically signed by: Zadie Herter MD 10/27/2023 07:53 PM EDT RP Workstation: [id number]  Scheduled Meds:  amLODipine   10 mg Oral Daily   apixaban   2.5 mg Oral BID   bacitracin    Topical BID   vitamin B-12  1,000 mcg Oral Daily   feeding supplement (GLUCERNA SHAKE)  237 mL Oral TID BM   fluticasone  1 spray Each Nare Daily   folic acid   1 mg Oral Daily   insulin  aspart  0-5 Units Subcutaneous QHS   insulin  aspart  0-9 Units Subcutaneous TID WC   insulin  aspart  2 Units Subcutaneous TID WC   insulin  glargine-yfgn  14 Units Subcutaneous Daily   metoprolol  tartrate  12.5 mg Oral BID   tamsulosin  0.4 mg Oral Daily   Continuous Infusions:     LOS: 19 days    Time spent: 35 minutes    Hagop Mccollam A Sarie Stall, MD [facility]   If 7PM-7AM, please contact night-coverage [email or web address]  10/29/2023, 11:26 AM

## 2023-10-29 NOTE — Plan of Care (Signed)
  Problem: Clinical Measurements: Goal: Ability to maintain clinical measurements within normal limits will improve Outcome: Progressing Goal: Will remain free from infection Outcome: Progressing Goal: Diagnostic test results will improve Outcome: Progressing Goal: Respiratory complications will improve Outcome: Progressing Goal: Cardiovascular complication will be avoided Outcome: Progressing   Problem: Nutrition: Goal: Adequate nutrition will be maintained Outcome: Progressing   Problem: Pain Managment: Goal: General experience of comfort will improve and/or be controlled Outcome: Progressing   Problem: Elimination: Goal: Will not experience complications related to bowel motility Outcome: Progressing Goal: Will not experience complications related to urinary retention Outcome: Progressing   Problem: Safety: Goal: Ability to remain free from injury will improve Outcome: Progressing   Problem: Skin Integrity: Goal: Risk for impaired skin integrity will decrease Outcome: Progressing

## 2023-10-30 DIAGNOSIS — G9340 Encephalopathy, unspecified: Secondary | ICD-10-CM | POA: Diagnosis not present

## 2023-10-30 LAB — CBC WITH DIFFERENTIAL/PLATELET
Abs Immature Granulocytes: 0.01 10*3/uL (ref 0.00–0.07)
Basophils Absolute: 0 10*3/uL (ref 0.0–0.1)
Basophils Relative: 1 %
Eosinophils Absolute: 0.2 10*3/uL (ref 0.0–0.5)
Eosinophils Relative: 7 %
HCT: 33.9 % — ABNORMAL LOW (ref 39.0–52.0)
Hemoglobin: 11.5 g/dL — ABNORMAL LOW (ref 13.0–17.0)
Immature Granulocytes: 0 %
Lymphocytes Relative: 26 %
Lymphs Abs: 0.9 10*3/uL (ref 0.7–4.0)
MCH: 30.9 pg (ref 26.0–34.0)
MCHC: 33.9 g/dL (ref 30.0–36.0)
MCV: 91.1 fL (ref 80.0–100.0)
Monocytes Absolute: 0.4 10*3/uL (ref 0.1–1.0)
Monocytes Relative: 13 %
Neutro Abs: 1.9 10*3/uL (ref 1.7–7.7)
Neutrophils Relative %: 53 %
Platelets: 182 10*3/uL (ref 150–400)
RBC: 3.72 MIL/uL — ABNORMAL LOW (ref 4.22–5.81)
RDW: 14.6 % (ref 11.5–15.5)
WBC: 3.5 10*3/uL — ABNORMAL LOW (ref 4.0–10.5)
nRBC: 0 % (ref 0.0–0.2)

## 2023-10-30 LAB — BASIC METABOLIC PANEL WITH GFR
Anion gap: 9 (ref 5–15)
BUN: 28 mg/dL — ABNORMAL HIGH (ref 8–23)
CO2: 19 mmol/L — ABNORMAL LOW (ref 22–32)
Calcium: 8.4 mg/dL — ABNORMAL LOW (ref 8.9–10.3)
Chloride: 106 mmol/L (ref 98–111)
Creatinine, Ser: 1.74 mg/dL — ABNORMAL HIGH (ref 0.61–1.24)
GFR, Estimated: 38 mL/min — ABNORMAL LOW (ref >60)
Glucose, Bld: 169 mg/dL — ABNORMAL HIGH (ref 70–99)
Potassium: 4.2 mmol/L (ref 3.5–5.1)
Sodium: 134 mmol/L — ABNORMAL LOW (ref 135–145)

## 2023-10-30 LAB — FERRITIN: Ferritin: 844 ng/mL — ABNORMAL HIGH (ref 24–336)

## 2023-10-30 LAB — IRON AND TIBC
Iron: 39 ug/dL — ABNORMAL LOW (ref 45–182)
Saturation Ratios: 30 % (ref 17.9–39.5)
TIBC: 132 ug/dL — ABNORMAL LOW (ref 250–450)
UIBC: 93 ug/dL

## 2023-10-30 LAB — GLUCOSE, CAPILLARY
Glucose-Capillary: 195 mg/dL — ABNORMAL HIGH (ref 70–99)
Glucose-Capillary: 265 mg/dL — ABNORMAL HIGH (ref 70–99)
Glucose-Capillary: 258 mg/dL — ABNORMAL HIGH (ref 70–99)
Glucose-Capillary: 186 mg/dL — ABNORMAL HIGH (ref 70–99)

## 2023-10-30 MED ORDER — SODIUM CHLORIDE 0.9 % IV SOLN: INTRAVENOUS | Status: DC

## 2023-10-30 MED ORDER — FERROUS SULFATE 325 (65 FE) MG PO TABS
325.0000 mg | ORAL_TABLET | Freq: Every day | ORAL | Status: DC
  Administered 2023-10-31 – 2023-11-08 (×9): 325 mg via ORAL
  Filled 2023-10-30 (×8): qty 1

## 2023-10-30 NOTE — Progress Notes (Addendum)
 PROGRESS NOTE    Connor Dixon  AOZ:[id number] DOB: Jan 01, 1935 DOA: 10/10/2023 PCP: Arcadio Knuckles, MD   Brief Narrative: 89 year old with past medical history significant for chronic diastolic heart failure, complete heart block status post pacemaker, CKD 3, diabetes, hypertension, hyperlipidemia who was admitted after being found slumped over in his chair at home and unresponsive.  Last known well was the evening prior to admission on 10/09/2023.  Baseline he is considered high functioning and ambulates independently and lives alone.  Evaluation consistent with CT head no acute process.  CTA head and neck demonstrated no large vessel occlusion.  There was evidence of a remote lacunar infarct right corona radiata.  MRI was negative for acute intracranial abnormality.  Evidence for chronic small vessels disease.  EEG unremarkable.  LP performed 10/13/2023 has  ruled out meningitis.  Antibiotics for meningitis were discontinued by neurology.  Patient received a steroid to cover Autoimmune component and subsequently discontinued 10/14/2023. He also completed IV IG for Autoimmune encephalopathy.   Serum Auto-immune panel performed at [facility] was negative.   CSF Autoimmune panel sent to [facility] clinic wont be back until 6/18. Discussed with neurology this test can be follow up out patient.  Plan to await for CSF Autoimmune results, prior to discharge, due to insurance concern.    Assessment & Plan:   Principal Problem:   Acute encephalopathy Active Problems:   Hyperlipidemia with target LDL less than 70   Essential hypertension   Sinus node dysfunction (HCC)   Insulin -requiring or dependent type II diabetes mellitus (HCC)   Paroxysmal atrial fibrillation (HCC)   Elevated troponin   Prolonged QT interval   Palliative care by specialist   1-Acute Metabolic Encephalopathy -MRI Negative for acute stroke, EEG no acute seizures.  LP- for meningitis. -Completed 5 days of IVIG on 6//2025 not  significant improvement noted. -Ammonia 22, ACTH  test negative, Thiamine  107, HIV non reactive, RPR non reactive, ANA negative, VDRL CSF negative, no oligoclonal band seen on CSF, meningitis encephalitis panel negative, CSF culture negative. Blood culture negative, TSH: 1.0 - Repeated MRI 6/3 with contrast was negative for acute finding, mention chronic small vessel disease, moderate generalized atrophy and white matter disease bilaterally.  Chronic CVA. - B12 low normal (270) getting intramuscular injection .  - Still in the differential of rapid neurocognitive decline from frontotemporal dementia versus global cognitive decline.   --Needs Rehab.  -Auto-immune panel Serum : Autoimmune  encephalopathy antibody negative,  Anti-Hu antibody negative, antineuronal antibody negative,  anti-Yo antibody negative,Amphiphysin Antibody Negative, mGluR1 Antibody Negative, Ma2/Ta Antibody Negative, GABA-B-R Antibody    Negative.    -CSF: Autoimmune panel sent to [facility] per [facility] that will be resulted now by 6/18.  -Per neurology ok to follow up this results out patient. Per family, Facility report if patient needs to be transfer from Facility to [facility] or [facility], there might be some issues or challenge with insurance. Plan to await for CSF Autoimmune test to results.  -I sent referral to out patient movement disorder at [facility].  -MS stable, fluctuates.  -Check Nocturnal Pulse Oxymetry. He had 4 desaturations with Oxygen sat 72--80 from 14 to 104 seconds , place on Nocturnal oxygen    2-Suspected cognitive impairment, POA - Patient had some more forgetfulness with directions, cooking/prepping meals, finances, and not driving lawn mower well .  - Could be possible that cognitive impairment became acutely worse in setting of acute illness.  Referral to Outpatient [facility] clinic made.  BL Ronchus:  Flutter valve. Incentive spirometry.  Duo-neb TID.  NSL  Improved.  Chest x ray; decreased lung Volume.   Acute  on chronic CKD 3 - Baseline Appears to be 1.5-1.6 -Creatinine peak to 2.5 Cr up to 1.7, resume IV fluids.   Insulin -dependent diabetes type 2 -Last A1c 8.7% on 09/06/2023  -Some intermittent hypoglycemia initially, now resolved.  -Continue with Lantus  to 14 units daily   Essential hypertension Continue with amlodipine   Elevated serum osmolar gap IgG normal/elevated; no free kappa chains; no M-spike   Elevated troponin - Echo unremarkable  Acute urinary retention - -Foley discontinued 6/2  -good urine out out.  Started on Flomax. Plan to monitor bladder scan. No retention  Urine culture insignificant growth.   Hyperlipidemia; not able to tolerate regular statin  Prolonged QT Sinus node dysfunction (HCC) - s/p pacemaker   Rash chest left shoulder, started  Bacitracin .,much better.   Pain with Urination:  -UA no significant WBC, No nitrates.  -Urine culture. Insignificant growth.  -Renal US ; no hydronephrosis, circumferential of bladder cystitis vs Outlet obstruction.  -Bladder scan have been negative. Will monitor.  -Discussed with Urology, bladder thickening might be from age as well. Agree with FU of urine culture. Could use tamsulosin  if pain reoccur. FU out patient with urology/. Renal cyst on US , follow out patient.   Anemia:  Iron low at 39, ferritin elevated could be phase reactant, T sat not low, to think of iron deficiency. But Iron at 39, could give him some iron supplement. He could have anemia of chronic diseases, or acute illness, or CKD anemia.   Estimated body mass index is 31.42 kg/m as calculated from the following:   Height as of this encounter: 5' 7 (1.702 m).   Weight as of this encounter: 91 kg.   DVT prophylaxis: Eliquis   Code Status: DNR limited Family Communication: Son who was at bedside 6/16 Disposition Plan:  Status is: Inpatient Remains inpatient appropriate because: management of encephalopathy    Consultants:  Neurology    Procedures:    Antimicrobials:    Subjective: Patient seen this AM, he was awake, he said how are you ? Today when asked, he was able to tell me his name. Others questions asked he continue to say OK---OK  Objective: Vitals:   10/29/23 2346 10/30/23 0339 10/30/23 0722 10/30/23 1254  BP: 123/67 128/74 105/74 106/63  Pulse: 87 88 92 83  Resp: 18 18 (P) 20 14  Temp: 98.9 F (37.2 C) 99.2 F (37.3 C) (P) 99.4 F (37.4 C) 99.5 F (37.5 C)  TempSrc: Oral Oral (P) Oral Oral  SpO2: 100% 98% 100% 97%  Weight:      Height:        Intake/Output Summary (Last 24 hours) at 10/30/2023 1353 Last data filed at 10/30/2023 1200 Gross per 24 hour  Intake 537.16 ml  Output 1700 ml  Net -1162.84 ml   Filed Weights   10/10/23 1705  Weight: 91 kg    Examination:  General exam: NAD Respiratory system: No ronchus  Cardiovascular system: S 1, S 2 RRR Gastrointestinal system: BS present, soft nt Central nervous system: alert, answer 2 questions, them most questions said ok Extremities:  no edema   Data Reviewed: I have personally reviewed following labs and imaging studies  CBC: Recent Labs  Lab 10/25/23 1152 10/26/23 0552 10/28/23 0538 10/29/23 0700 10/30/23 0439  WBC 5.4 4.9 3.9* 3.6* 3.5*  NEUTROABS  --  3.0  --   --  1.9  HGB 13.2 12.7* 12.1* 11.3* 11.5*  HCT 38.9* 37.6* 35.2* 34.2* 33.9*  MCV 89.4 89.7 90.0 90.2 91.1  PLT 167 180 173 171 182   Basic Metabolic Panel: Recent Labs  Lab 10/26/23 0552 10/27/23 0758 10/28/23 0538 10/29/23 0700 10/30/23 0439  NA 133* 134* 134* 137 134*  K 4.6 3.9 3.9 4.3 4.2  CL 105 107 107 106 106  CO2 23 20* 22 21* 19*  GLUCOSE 174* 175* 167* 174* 169*  BUN 30* 24* 27* 23 28*  CREATININE 1.83* 1.55* 1.64* 1.56* 1.74*  CALCIUM 8.5* 8.1* 8.2* 8.4* 8.4*   GFR: Estimated Creatinine Clearance: 32.8 mL/min (A) (by C-G formula based on SCr of 1.74 mg/dL (H)). Liver Function Tests: No results for input(s): AST, ALT, ALKPHOS,  BILITOT, PROT, ALBUMIN in the last 168 hours. No results for input(s): LIPASE, AMYLASE in the last 168 hours. No results for input(s): AMMONIA in the last 168 hours.  Coagulation Profile: No results for input(s): INR, PROTIME in the last 168 hours. Cardiac Enzymes: No results for input(s): CKTOTAL, CKMB, CKMBINDEX, TROPONINI in the last 168 hours. BNP (last 3 results) No results for input(s): PROBNP in the last 8760 hours. HbA1C: No results for input(s): HGBA1C in the last 72 hours. CBG: Recent Labs  Lab 10/29/23 1116 10/29/23 1608 10/29/23 2115 10/30/23 0611 10/30/23 1221  GLUCAP 116* 313* 205* 195* 265*   Lipid Profile: No results for input(s): CHOL, HDL, LDLCALC, TRIG, CHOLHDL, LDLDIRECT in the last 72 hours. Thyroid  Function Tests: No results for input(s): TSH, T4TOTAL, FREET4, T3FREE, THYROIDAB in the last 72 hours. Anemia Panel: Recent Labs    10/30/23 0439  FERRITIN 844*  TIBC 132*  IRON 39*   Sepsis Labs: No results for input(s): PROCALCITON, LATICACIDVEN in the last 168 hours.  Recent Results (from the past 240 hours)  Urine Culture (for pregnant, neutropenic or urologic patients or patients with an indwelling urinary catheter)     Status: Abnormal   Collection Time: 10/28/23  7:04 AM   Specimen: Urine, Clean Catch  Result Value Ref Range Status   Specimen Description URINE, CLEAN CATCH  Final   Special Requests NONE  Final   Culture (A)  Final    <10,000 COLONIES/mL INSIGNIFICANT GROWTH Performed at [facility], [address] [place], [place] [address]    Report Status 10/29/2023 FINAL  Final         Radiology Studies: No results found.       Scheduled Meds:  amLODipine   10 mg Oral Daily   apixaban   2.5 mg Oral BID   bacitracin    Topical BID   vitamin B-12  1,000 mcg Oral Daily   feeding supplement (GLUCERNA SHAKE)  237 mL Oral TID BM   [START ON 10/31/2023] ferrous sulfate   325 mg Oral Q breakfast   fluticasone  1 spray Each Nare Daily   folic acid   1 mg Oral Daily   insulin  aspart  0-5 Units Subcutaneous QHS   insulin  aspart  0-9 Units Subcutaneous TID WC   insulin  aspart  2 Units Subcutaneous TID WC   insulin  glargine-yfgn  14 Units Subcutaneous Daily   metoprolol  tartrate  12.5 mg Oral BID   tamsulosin  0.4 mg Oral Daily   Continuous Infusions:  sodium chloride  75 mL/hr at 10/30/23 1200      LOS: 20 days    Time spent: 35 minutes    Naika Noto A Ferris Fielden, MD [facility]   If 7PM-7AM,  please contact night-coverage [email or web address]  10/30/2023, 1:53 PM

## 2023-10-30 NOTE — Plan of Care (Signed)
 Maintained on O2 support 2lpm at bedtime. Pt is calm, comfortably sleeping. No significant changes happen as of this time. Problem: Coping: Goal: Ability to adjust to condition or change in health will improve Outcome: Progressing   Problem: Health Behavior/Discharge Planning: Goal: Ability to manage health-related needs will improve Outcome: Progressing   Problem: Skin Integrity: Goal: Risk for impaired skin integrity will decrease Outcome: Progressing   Problem: Self-Care: Goal: Ability to participate in self-care as condition permits will improve Outcome: Progressing   Problem: Self-Care: Goal: Ability to communicate needs accurately will improve Outcome: Progressing   Problem: Activity: Goal: Risk for activity intolerance will decrease Outcome: Progressing   Problem: Safety: Goal: Ability to remain free from injury will improve Outcome: Progressing

## 2023-10-30 NOTE — Progress Notes (Addendum)
 Speech Language Pathology Treatment: Cognitive-Linguistic  Patient Details Name: Connor Dixon MRN: [id number] DOB: February 07, 1935 Today's Date: 10/30/2023 Time: 1400-1420 SLP Time Calculation (min) (ACUTE ONLY): 20 min  Assessment / Plan / Recommendation Clinical Impression  Skilled therapy session focused on communication goals. SLP facilitated session by providing max (written/verbal)A for patient to verbalize name and age. Patient verbalized Connor Dixon 14 and bye today with maxA, though with extremely low vocal intensity. SLP attempted automatic naming tasks (1-5) and melodic intonation, however patient lethargic with no attempts at vocalizations. SLP educated patients son on importance of repetition and encouraging participation in automatic speech tasks (name, numbers, days of the week) outside of tx times. Son verbalized understanding with reports patient waxing and waning today. Patient left in bed with alarm set and call bell in reach. Continue POC.   Of note: Patient had just finished lunch tray, therefore no additional PO offered this session. Son reporting patient is doing well on D1/thin liquid diet.    HPI HPI: Pt is 89 year old presented to [facility] on  10/10/23 after found at home unresponsive. Pt with dense encephalopathy with flexor positioning of UE's. CT negative for acute changes and MRI reports No acute intracranial abnormality by noncontrast MRI.  2. Signal changes compatible with chronic small vessel disease. Generalized appearing brain volume loss.  PMH - afib on Eliquis , pacer, HTN, CKD, DM, obesity      SLP Plan  Continue with current plan of care          Recommendations  Diet recommendations: Dysphagia 1 (puree);Thin liquid Medication Administration: Crushed with puree Supervision: Full supervision/cueing for compensatory strategies;Trained caregiver to feed patient Compensations: Minimize environmental distractions;Slow rate;Small sips/bites;Lingual sweep for  clearance of pocketing Postural Changes and/or Swallow Maneuvers: Seated upright 90 degrees                  Oral care BID   Frequent or constant Supervision/Assistance Dysphagia, unspecified (R13.10);Aphasia (R47.01);Cognitive communication deficit (R41.841)     Continue with current plan of care     Connor Dixon M.A., CCC-SLP 10/30/2023, 2:21 PM

## 2023-10-31 ENCOUNTER — Inpatient Hospital Stay (HOSPITAL_COMMUNITY)

## 2023-10-31 DIAGNOSIS — G9340 Encephalopathy, unspecified: Secondary | ICD-10-CM | POA: Diagnosis not present

## 2023-10-31 LAB — BASIC METABOLIC PANEL WITH GFR
Anion gap: 10 (ref 5–15)
BUN: 26 mg/dL — ABNORMAL HIGH (ref 8–23)
CO2: 17 mmol/L — ABNORMAL LOW (ref 22–32)
Calcium: 8.5 mg/dL — ABNORMAL LOW (ref 8.9–10.3)
Chloride: 107 mmol/L (ref 98–111)
Creatinine, Ser: 1.7 mg/dL — ABNORMAL HIGH (ref 0.61–1.24)
GFR, Estimated: 39 mL/min — ABNORMAL LOW (ref >60)
Glucose, Bld: 241 mg/dL — ABNORMAL HIGH (ref 70–99)
Potassium: 4.3 mmol/L (ref 3.5–5.1)
Sodium: 134 mmol/L — ABNORMAL LOW (ref 135–145)

## 2023-10-31 LAB — CBC
HCT: 35.8 % — ABNORMAL LOW (ref 39.0–52.0)
Hemoglobin: 12 g/dL — ABNORMAL LOW (ref 13.0–17.0)
MCH: 30.5 pg (ref 26.0–34.0)
MCHC: 33.5 g/dL (ref 30.0–36.0)
MCV: 90.9 fL (ref 80.0–100.0)
Platelets: 189 10*3/uL (ref 150–400)
RBC: 3.94 MIL/uL — ABNORMAL LOW (ref 4.22–5.81)
RDW: 14.7 % (ref 11.5–15.5)
WBC: 3.2 10*3/uL — ABNORMAL LOW (ref 4.0–10.5)
nRBC: 0 % (ref 0.0–0.2)

## 2023-10-31 LAB — URINALYSIS, ROUTINE W REFLEX MICROSCOPIC
Bilirubin Urine: NEGATIVE
Glucose, UA: >=500 mg/dL — AB
Hgb urine dipstick: NEGATIVE
Ketones, ur: NEGATIVE mg/dL
Leukocytes,Ua: NEGATIVE
Nitrite: NEGATIVE
Protein, ur: >=300 mg/dL — AB
Specific Gravity, Urine: 1.007 (ref 1.005–1.030)
pH: 5 (ref 5.0–8.0)

## 2023-10-31 LAB — MRSA NEXT GEN BY PCR, NASAL: MRSA by PCR Next Gen: DETECTED — AB

## 2023-10-31 LAB — GLUCOSE, CAPILLARY
Glucose-Capillary: 220 mg/dL — ABNORMAL HIGH (ref 70–99)
Glucose-Capillary: 199 mg/dL — ABNORMAL HIGH (ref 70–99)
Glucose-Capillary: 244 mg/dL — ABNORMAL HIGH (ref 70–99)
Glucose-Capillary: 281 mg/dL — ABNORMAL HIGH (ref 70–99)

## 2023-10-31 MED ORDER — INSULIN ASPART 100 UNIT/ML IJ SOLN
3.0000 [IU] | Freq: Three times a day (TID) | INTRAMUSCULAR | Status: DC
  Administered 2023-10-31 – 2023-11-08 (×23): 3 [IU] via SUBCUTANEOUS

## 2023-10-31 MED ORDER — LINEZOLID 600 MG/300ML IV SOLN
600.0000 mg | Freq: Two times a day (BID) | INTRAVENOUS | Status: DC
  Administered 2023-10-31 (×2): 600 mg via INTRAVENOUS
  Filled 2023-10-31 (×3): qty 300

## 2023-10-31 MED ORDER — SODIUM BICARBONATE 650 MG PO TABS
1300.0000 mg | ORAL_TABLET | Freq: Two times a day (BID) | ORAL | Status: DC
  Administered 2023-10-31 – 2023-11-08 (×17): 1300 mg via ORAL
  Filled 2023-10-31 (×17): qty 2

## 2023-10-31 MED ORDER — SODIUM CHLORIDE 0.9 % IV SOLN
3.0000 g | Freq: Three times a day (TID) | INTRAVENOUS | Status: DC
  Administered 2023-10-31 – 2023-11-03 (×10): 3 g via INTRAVENOUS
  Filled 2023-10-31 (×10): qty 8

## 2023-10-31 NOTE — Progress Notes (Signed)
 Physical Therapy Treatment Patient Details Name: Connor Dixon MRN: [id number] DOB: 1934/11/29 Today's Date: 10/31/2023   History of Present Illness Pt is 89 year old presented to [facility] on  10/10/23 after found at home unresponsive. Pt with dense encephalopathy with flexor positioning of UE's. LP performed on 10/13/2023 has ruled out meningitis. CT and MRI on 5/29 were negative for acute intercranial abnormality.  PMH - afib on Eliquis , pacer, HTN, CKD, DM, obesity    PT Comments  Pt progressing well towards his physical therapy goals this session; he was alert and followed one step commands ~50-75% of the time. Although fatigue is still a component, he was able to tolerate a 45 minute session today. Worked on static sitting balance and establishing midline, cervical/truncal ROM, and pre transfer training using Stedy. Pt with good participation throughout. Patient will benefit from continued inpatient follow up therapy, <3 hours/day to address strengthening, balance, cognition and endurance.    If plan is discharge home, recommend the following: Two people to help with walking and/or transfers;Assistance with cooking/housework;Supervision due to cognitive status;Assist for transportation;Help with stairs or ramp for entrance   Can travel by private vehicle     No  Equipment Recommendations  BSC/3in1;Wheelchair cushion (measurements PT);Wheelchair (measurements PT);Hoyer lift;Hospital bed    Recommendations for Other Services       Precautions / Restrictions Precautions Precautions: Fall Recall of Precautions/Restrictions: Impaired Restrictions Weight Bearing Restrictions Per Provider Order: No     Mobility  Bed Mobility Overal bed mobility: Needs Assistance Bed Mobility: Supine to Sit, Sit to Supine     Supine to sit: Mod assist, +2 for physical assistance Sit to supine: Max assist, +2 for physical assistance   General bed mobility comments: Pt exiting towards right side of bed,  help for trunk elevation to upright and use of bed pad to scoot hips out to edge. Increased assist for return to supine    Transfers Overall transfer level: Needs assistance Equipment used: Ambulation equipment used Transfers: Sit to/from Stand Sit to Stand: Max assist, +2 physical assistance           General transfer comment: Sit to stands using Stedy and pulling on bar, use of bed pad to lift hips, and manual placement of feet shoulder width apart. MaxA + 2 to achieve upright posture. Cueing for bilateral quad activation, upward gaze Transfer via Lift Equipment: Stedy  Ambulation/Gait                   Stairs             Wheelchair Mobility     Tilt Bed    Modified Rankin (Stroke Patients Only)       Balance Overall balance assessment: Needs assistance Sitting-balance support: Feet supported Sitting balance-Leahy Scale: Poor Sitting balance - Comments: Left lateral and posterior lean. CGA-modA, decreased righting reaction                                    Communication Communication Communication: Impaired Factors Affecting Communication: Difficulty expressing self  Cognition Arousal: Alert Behavior During Therapy: Flat affect   PT - Cognitive impairments: Initiation, Attention, Problem solving, Awareness, Safety/Judgement                         Following commands: Impaired Following commands impaired: Follows one step commands inconsistently    Cueing Cueing Techniques: Gestural  cues, Verbal cues, Tactile cues, Visual cues  Exercises Other Exercises Other Exercises: Sitting: truncal rotation to R x 5 Other Exercises: Sitting: manual cervical stretching into right lateral flexion and left rotation    General Comments        Pertinent Vitals/Pain Pain Assessment Pain Assessment: Faces Faces Pain Scale: No hurt    Home Living                          Prior Function            PT Goals  (current goals can now be found in the care plan section) Acute Rehab PT Goals Patient Stated Goal: improve pt mobility Potential to Achieve Goals: Fair Progress towards PT goals: Progressing toward goals    Frequency    Min 2X/week      PT Plan      Co-evaluation              AM-PAC PT 6 Clicks Mobility   Outcome Measure  Help needed turning from your back to your side while in a flat bed without using bedrails?: A Lot Help needed moving from lying on your back to sitting on the side of a flat bed without using bedrails?: A Lot Help needed moving to and from a bed to a chair (including a wheelchair)?: Total Help needed standing up from a chair using your arms (e.g., wheelchair or bedside chair)?: Total Help needed to walk in hospital room?: Total Help needed climbing 3-5 steps with a railing? : Total 6 Click Score: 8    End of Session Equipment Utilized During Treatment: Gait belt Activity Tolerance: Patient tolerated treatment well Patient left: in bed;with call bell/phone within reach;with bed alarm set Nurse Communication: Mobility status PT Visit Diagnosis: Other abnormalities of gait and mobility (R26.89);Other symptoms and signs involving the nervous system (R29.898);Muscle weakness (generalized) (M62.81)     Time: 1610-9604 PT Time Calculation (min) (ACUTE ONLY): 47 min  Charges:    $Therapeutic Activity: 23-37 mins $Neuromuscular Re-education: 8-22 mins PT General Charges $$ ACUTE PT VISIT: 1 Visit                     Verdia Glad, PT, DPT Acute Rehabilitation Services Office [phone]    Claria Crofts 10/31/2023, 1:27 PM

## 2023-10-31 NOTE — Plan of Care (Signed)
  Problem: Education: Goal: Ability to describe self-care measures that may prevent or decrease complications (Diabetes Survival Skills Education) will improve Outcome: Progressing Goal: Individualized Educational Video(s) Outcome: Progressing   Problem: Coping: Goal: Ability to adjust to condition or change in health will improve Outcome: Progressing   Problem: Fluid Volume: Goal: Ability to maintain a balanced intake and output will improve Outcome: Progressing   Problem: Health Behavior/Discharge Planning: Goal: Ability to identify and utilize available resources and services will improve Outcome: Progressing Goal: Ability to manage health-related needs will improve Outcome: Progressing   Problem: Metabolic: Goal: Ability to maintain appropriate glucose levels will improve Outcome: Progressing   Problem: Nutritional: Goal: Maintenance of adequate nutrition will improve Outcome: Progressing Goal: Progress toward achieving an optimal weight will improve Outcome: Progressing   Problem: Skin Integrity: Goal: Risk for impaired skin integrity will decrease Outcome: Progressing   Problem: Tissue Perfusion: Goal: Adequacy of tissue perfusion will improve Outcome: Progressing   Problem: Coping: Goal: Will verbalize positive feelings about self Outcome: Progressing Goal: Will identify appropriate support needs Outcome: Progressing   Problem: Health Behavior/Discharge Planning: Goal: Ability to manage health-related needs will improve Outcome: Progressing Goal: Goals will be collaboratively established with patient/family Outcome: Progressing   Problem: Self-Care: Goal: Ability to participate in self-care as condition permits will improve Outcome: Progressing Goal: Verbalization of feelings and concerns over difficulty with self-care will improve Outcome: Progressing Goal: Ability to communicate needs accurately will improve Outcome: Progressing   Problem:  Nutrition: Goal: Risk of aspiration will decrease Outcome: Progressing Goal: Dietary intake will improve Outcome: Progressing   Problem: Education: Goal: Knowledge of General Education information will improve Description: Including pain rating scale, medication(s)/side effects and non-pharmacologic comfort measures Outcome: Progressing   Problem: Health Behavior/Discharge Planning: Goal: Ability to manage health-related needs will improve Outcome: Progressing   Problem: Clinical Measurements: Goal: Ability to maintain clinical measurements within normal limits will improve Outcome: Progressing Goal: Will remain free from infection Outcome: Progressing Goal: Diagnostic test results will improve Outcome: Progressing Goal: Respiratory complications will improve Outcome: Progressing Goal: Cardiovascular complication will be avoided Outcome: Progressing   Problem: Activity: Goal: Risk for activity intolerance will decrease Outcome: Progressing   Problem: Nutrition: Goal: Adequate nutrition will be maintained Outcome: Progressing   Problem: Coping: Goal: Level of anxiety will decrease Outcome: Progressing   Problem: Elimination: Goal: Will not experience complications related to bowel motility Outcome: Progressing Goal: Will not experience complications related to urinary retention Outcome: Progressing   Problem: Pain Managment: Goal: General experience of comfort will improve and/or be controlled Outcome: Progressing   Problem: Safety: Goal: Ability to remain free from injury will improve Outcome: Progressing   Problem: Skin Integrity: Goal: Risk for impaired skin integrity will decrease Outcome: Progressing

## 2023-10-31 NOTE — Progress Notes (Signed)
 PHARMACY ANTIBIOTIC CONSULT NOTE   Connor Dixon a 89 y.o. male p/w acute metabolic encephalopathy, possibly autoimmune in origin.  Pharmacy has been consulted for Unasyn dosing for aspiration PNA.  Saturating in the 90-100% range ORA.   6/17: Scr 1.70, WBC 3.2  Vital Signs: Tm 100.4, HR 90s, BP WNL  Estimated Creatinine Clearance: 33.6 mL/min (A) (by C-G formula based on SCr of 1.7 mg/dL (H)).  Plan: Unasyn 3g IV Q8H x 5 days  Will sign off consult at this time  Allergies:  Allergies  Allergen Reactions   Oysters [Shellfish Allergy] Hives and Swelling   Citrus Hives   Pecan Extract Hives   Apple Juice Other (See Comments)    Sore throat   Farxiga  [Dapagliflozin ] Other (See Comments)    Low blood sugar    Filed Weights   10/10/23 1705  Weight: 91 kg (200 lb 9.9 oz)       Latest Ref Rng & Units 10/31/2023    8:32 AM 10/30/2023    4:39 AM 10/29/2023    7:00 AM  CBC  WBC 4.0 - 10.5 K/uL 3.2  3.5  3.6   Hemoglobin 13.0 - 17.0 g/dL 16.1  09.6  04.5   Hematocrit 39.0 - 52.0 % 35.8  33.9  34.2   Platelets 150 - 400 K/uL 189  182  171     Antibiotics Given (last 72 hours)     None        Thank you for allowing pharmacy to be a part of this patient's care.  Chrystie Crass, PharmD Clinical Pharmacist  10/31/2023 9:42 AM

## 2023-10-31 NOTE — Progress Notes (Signed)
 PROGRESS NOTE    Connor Dixon  XBJ:[id number] DOB: 04-24-1935 DOA: 10/10/2023 PCP: Arcadio Knuckles, MD   Brief Narrative: 89 year old with past medical history significant for chronic diastolic heart failure, complete heart block status post pacemaker, CKD 3, diabetes, hypertension, hyperlipidemia who was admitted after being found slumped over in his chair at home and unresponsive.  Last known well was the evening prior to admission on 10/09/2023.  Baseline he is considered high functioning and ambulates independently and lives alone.  Evaluation consistent with CT head no acute process.  CTA head and neck demonstrated no large vessel occlusion.  There was evidence of a remote lacunar infarct right corona radiata.  MRI was negative for acute intracranial abnormality.  Evidence for chronic small vessels disease.  EEG unremarkable.  LP performed 10/13/2023 has  ruled out meningitis.  Antibiotics for meningitis were discontinued by neurology.  Patient received a steroid to cover Autoimmune component and subsequently discontinued 10/14/2023. He also completed IV IG for Autoimmune encephalopathy.   Serum Auto-immune panel performed at [facility] was negative.   CSF Autoimmune panel sent to [facility] clinic wont be back until 6/18.  Plan to await for CSF Autoimmune results, prior to discharge, due to insurance concern.  He develops low grade fever 6/16 night. Started on IV antibiotics, follow cultures.    Assessment & Plan:   Principal Problem:   Acute encephalopathy Active Problems:   Hyperlipidemia with target LDL less than 70   Essential hypertension   Sinus node dysfunction (HCC)   Insulin -requiring or dependent type II diabetes mellitus (HCC)   Paroxysmal atrial fibrillation (HCC)   Elevated troponin   Prolonged QT interval   Palliative care by specialist   1-Acute Metabolic Encephalopathy -MRI Negative for acute stroke, EEG no acute seizures.  LP- for meningitis. -Completed 5 days of  IVIG on 6//2025 not significant improvement noted. -Ammonia 22, ACTH  test negative, Thiamine  107, HIV non reactive, RPR non reactive, ANA negative, VDRL CSF negative, no oligoclonal band seen on CSF, meningitis encephalitis panel negative, CSF culture negative. Blood culture negative, TSH: 1.0 - Repeated MRI 6/3 with contrast was negative for acute finding, mention chronic small vessel disease, moderate generalized atrophy and white matter disease bilaterally.  Chronic CVA. - B12 low normal (270) getting intramuscular injection .  - Still in the differential of rapid neurocognitive decline from frontotemporal dementia versus global cognitive decline.   --Needs Rehab.  -Auto-immune panel Serum performed at [facility]: Autoimmune  encephalopathy antibody negative,  Anti-Hu antibody negative, antineuronal antibody negative,  anti-Yo antibody negative,Amphiphysin Antibody Negative, mGluR1 Antibody Negative, Ma2/Ta Antibody Negative, GABA-B-R Antibody    Negative.    -CSF: Autoimmune panel sent to [facility] per [facility] that will be resulted now by 6/18.  -Per neurology ok to follow up this results out patient. Per family, Facility said, if patient needs to be transfer from Facility to [facility] or [facility], there might be some issues or challenge with insurance. -Plan to await for CSF Autoimmune test to results.  -I sent referral to out patient movement disorder at [facility].  -Nocturnal Pulse Oxymetry. He had 4 desaturations with Oxygen sat 72--80 from 14 to 104 seconds , place on Nocturnal oxygen at HS>  -MS stable, fluctuates. He has days he is more alert, he continue to answer most questions Ok, Yes  -Suspected cognitive impairment, POA - Patient had some more forgetfulness with directions, cooking/prepping meals, finances, and not driving lawn mower well .  - Could be possible that cognitive  impairment became acutely worse in setting of acute illness.  Referral to Outpatient [facility] clinic made.  Metabolic  acidosis;  -Hold IV fluids due to pulmonary congestion on x ray.  -Will start oral bicarb.  -Anion gap is normal.  -On IV antibiotics due to fever  Fever: Had low grade fever 6/17 Repeated chest x ray 6/17: Low volume film with pulmonary vascular congestion. No overt pulmonary edema UA;WBC 0-5 Follow Blood culture.  Started on IV Unasyn (he is at risk of aspiration, and IV lib=nezolid.   BL Ronchus:  Flutter valve. Incentive spirometry. He is not able to use devices.   Duo-neb TID.  Improved.  Chest x ray; decreased lung Volume.   Acute on chronic CKD 3 - Baseline Appears to be 1.5-1.6 -Creatinine peak to 2.5 Cr up to 1.7, received fluids. NSL fluid chest x ray with pulmonary congestion.   Insulin -dependent diabetes type 2 -Last A1c 8.7% on 09/06/2023  -Some intermittent hypoglycemia initially, now resolved.  -Continue with Lantus  to 14 units daily  -Increase meal coverage to 3 units.   Essential hypertension Continue with amlodipine   Elevated serum osmolar gap IgG normal/elevated; no free kappa chains; no M-spike   Elevated troponin - Echo unremarkable  Acute urinary retention - -Foley discontinued 6/2  -good urine out out.  Started on Flomax. Plan to monitor bladder scan. No retention  Urine culture insignificant growth.   Hyperlipidemia; not able to tolerate regular statin  Prolonged QT Sinus node dysfunction (HCC) - s/p pacemaker   Rash chest left shoulder, started  Bacitracin .,much better.   Pain with Urination:  -UA no significant WBC, No nitrates.  -Urine culture. Insignificant growth.  -Renal US ; no hydronephrosis, circumferential of bladder cystitis vs Outlet obstruction.  -Bladder scan have been negative.  monitor.  -Discussed with Urology, bladder thickening might be from age as well. Agree with FU of urine culture. Could use tamsulosin  if pain reoccur. FU out patient with urology/. Renal cyst on US , follow out patient.   Anemia:  Iron low at  39, ferritin elevated could be phase reactant, T sat not low, to think of iron deficiency. But Iron at 39, could give him some iron supplement. He could have anemia of chronic diseases, or acute illness, or CKD anemia.  Hb stable.    Estimated body mass index is 31.42 kg/m as calculated from the following:   Height as of this encounter: 5' 7 (1.702 m).   Weight as of this encounter: 91 kg.   DVT prophylaxis: Eliquis   Code Status: DNR limited Family Communication: Son who was at bedside 6/17 Disposition Plan:  Status is: Inpatient Remains inpatient appropriate because: management of encephalopathy    Consultants:  Neurology   Procedures:    Antimicrobials:    Subjective: He is alert, he work with PT, needs Max assist, he was able to follow some command.  He continue to say ok, yes to most questions. He was not able to tell me his name today. His MS fluctuates, he has days he is alert, others day he is more sleepy.   Objective: Vitals:   10/30/23 2036 10/30/23 2122 10/30/23 2315 10/31/23 0400  BP: 126/66 126/66 (!) 119/56 139/83  Pulse: 91 91 81 84  Resp: 18  18 18   Temp: (!) 100.4 F (38 C)  99.2 F (37.3 C) 97.8 F (36.6 C)  TempSrc: Oral  Oral   SpO2: 94%  95% 98%  Weight:      Height:  Intake/Output Summary (Last 24 hours) at 10/31/2023 0739 Last data filed at 10/31/2023 0600 Gross per 24 hour  Intake 1240.2 ml  Output 1275 ml  Net -34.8 ml   Filed Weights   10/10/23 1705  Weight: 91 kg    Examination:  General exam: NAD Respiratory system: No ronchus.  Cardiovascular system: S 1, S 2 RRR Gastrointestinal system: BS present, soft, nt Central nervous system: Awake, alert,  Extremities:  no edema   Data Reviewed: I have personally reviewed following labs and imaging studies  CBC: Recent Labs  Lab 10/25/23 1152 10/26/23 0552 10/28/23 0538 10/29/23 0700 10/30/23 0439  WBC 5.4 4.9 3.9* 3.6* 3.5*  NEUTROABS  --  3.0  --   --  1.9   HGB 13.2 12.7* 12.1* 11.3* 11.5*  HCT 38.9* 37.6* 35.2* 34.2* 33.9*  MCV 89.4 89.7 90.0 90.2 91.1  PLT 167 180 173 171 182   Basic Metabolic Panel: Recent Labs  Lab 10/26/23 0552 10/27/23 0758 10/28/23 0538 10/29/23 0700 10/30/23 0439  NA 133* 134* 134* 137 134*  K 4.6 3.9 3.9 4.3 4.2  CL 105 107 107 106 106  CO2 23 20* 22 21* 19*  GLUCOSE 174* 175* 167* 174* 169*  BUN 30* 24* 27* 23 28*  CREATININE 1.83* 1.55* 1.64* 1.56* 1.74*  CALCIUM 8.5* 8.1* 8.2* 8.4* 8.4*   GFR: Estimated Creatinine Clearance: 32.8 mL/min (A) (by C-G formula based on SCr of 1.74 mg/dL (H)). Liver Function Tests: No results for input(s): AST, ALT, ALKPHOS, BILITOT, PROT, ALBUMIN in the last 168 hours. No results for input(s): LIPASE, AMYLASE in the last 168 hours. No results for input(s): AMMONIA in the last 168 hours.  Coagulation Profile: No results for input(s): INR, PROTIME in the last 168 hours. Cardiac Enzymes: No results for input(s): CKTOTAL, CKMB, CKMBINDEX, TROPONINI in the last 168 hours. BNP (last 3 results) No results for input(s): PROBNP in the last 8760 hours. HbA1C: No results for input(s): HGBA1C in the last 72 hours. CBG: Recent Labs  Lab 10/30/23 0611 10/30/23 1221 10/30/23 1628 10/30/23 2119 10/31/23 0605  GLUCAP 195* 265* 258* 186* 220*   Lipid Profile: No results for input(s): CHOL, HDL, LDLCALC, TRIG, CHOLHDL, LDLDIRECT in the last 72 hours. Thyroid  Function Tests: No results for input(s): TSH, T4TOTAL, FREET4, T3FREE, THYROIDAB in the last 72 hours. Anemia Panel: Recent Labs    10/30/23 0439  FERRITIN 844*  TIBC 132*  IRON 39*   Sepsis Labs: No results for input(s): PROCALCITON, LATICACIDVEN in the last 168 hours.  Recent Results (from the past 240 hours)  Urine Culture (for pregnant, neutropenic or urologic patients or patients with an indwelling urinary catheter)     Status: Abnormal    Collection Time: 10/28/23  7:04 AM   Specimen: Urine, Clean Catch  Result Value Ref Range Status   Specimen Description URINE, CLEAN CATCH  Final   Special Requests NONE  Final   Culture (A)  Final    <10,000 COLONIES/mL INSIGNIFICANT GROWTH Performed at [facility], [address], [place], [place] [address]    Report Status 10/29/2023 FINAL  Final         Radiology Studies: No results found.       Scheduled Meds:  amLODipine   10 mg Oral Daily   apixaban   2.5 mg Oral BID   bacitracin    Topical BID   vitamin B-12  1,000 mcg Oral Daily   feeding supplement (GLUCERNA SHAKE)  237 mL Oral TID BM  ferrous sulfate  325 mg Oral Q breakfast   fluticasone  1 spray Each Nare Daily   folic acid   1 mg Oral Daily   insulin  aspart  0-5 Units Subcutaneous QHS   insulin  aspart  0-9 Units Subcutaneous TID WC   insulin  aspart  2 Units Subcutaneous TID WC   insulin  glargine-yfgn  14 Units Subcutaneous Daily   metoprolol  tartrate  12.5 mg Oral BID   tamsulosin  0.4 mg Oral Daily   Continuous Infusions:  sodium chloride  75 mL/hr at 10/31/23 0641      LOS: 21 days    Time spent: 35 minutes    Deidrick Rainey A Diesel Lina, MD [facility]   If 7PM-7AM, please contact night-coverage [email or web address]  10/31/2023, 7:39 AM

## 2023-10-31 NOTE — Inpatient Diabetes Management (Signed)
 Inpatient Diabetes Program Recommendations  AACE/ADA: New Consensus Statement on Inpatient Glycemic Control (2015)  Target Ranges:  Prepandial:   less than 140 mg/dL      Peak postprandial:   less than 180 mg/dL (1-2 hours)      Critically ill patients:  140 - 180 mg/dL   Lab Results  Component Value Date   GLUCAP 220 (H) 10/31/2023   HGBA1C 8.7 (H) 09/06/2023    Review of Glycemic Control  Latest Reference Range & Units 10/30/23 06:11 10/30/23 12:21 10/30/23 16:28 10/30/23 21:19 10/31/23 06:05  Glucose-Capillary 70 - 99 mg/dL 841 (H) 324 (H) 401 (H) 186 (H) 220 (H)  (H): Data is abnormally high  Diabetes history: DM2 Outpatient Diabetes medications: Humulin  70/30- 40 units QA/ 25 units QP  Current orders for Inpatient glycemic control: Semglee  14 units every day, Novolog  0-9 units TID and 0-5 units at bedtime, Novolog  2 units TID  Inpatient Diabetes Program Recommendations:    Might consider:  Novolog  4 units TID with meals if he consumes at least 50%.  Thank you, Hays Lipschutz, MSN, CDCES Diabetes Coordinator Inpatient Diabetes Program [phone] (team pager from 8a-5p)

## 2023-11-01 DIAGNOSIS — G9340 Encephalopathy, unspecified: Secondary | ICD-10-CM | POA: Diagnosis not present

## 2023-11-01 LAB — GLUCOSE, CAPILLARY
Glucose-Capillary: 222 mg/dL — ABNORMAL HIGH (ref 70–99)
Glucose-Capillary: 221 mg/dL — ABNORMAL HIGH (ref 70–99)
Glucose-Capillary: 216 mg/dL — ABNORMAL HIGH (ref 70–99)
Glucose-Capillary: 276 mg/dL — ABNORMAL HIGH (ref 70–99)
Glucose-Capillary: 261 mg/dL — ABNORMAL HIGH (ref 70–99)

## 2023-11-01 LAB — BASIC METABOLIC PANEL WITH GFR
Anion gap: 9 (ref 5–15)
BUN: 23 mg/dL (ref 8–23)
CO2: 20 mmol/L — ABNORMAL LOW (ref 22–32)
Calcium: 8 mg/dL — ABNORMAL LOW (ref 8.9–10.3)
Chloride: 105 mmol/L (ref 98–111)
Creatinine, Ser: 1.63 mg/dL — ABNORMAL HIGH (ref 0.61–1.24)
GFR, Estimated: 41 mL/min — ABNORMAL LOW (ref >60)
Glucose, Bld: 206 mg/dL — ABNORMAL HIGH (ref 70–99)
Potassium: 4 mmol/L (ref 3.5–5.1)
Sodium: 134 mmol/L — ABNORMAL LOW (ref 135–145)

## 2023-11-01 MED ORDER — GLUCERNA SHAKE PO LIQD
237.0000 mL | ORAL | Status: DC
  Administered 2023-11-02 – 2023-11-05 (×4): 237 mL via ORAL

## 2023-11-01 MED ORDER — LINEZOLID 600 MG/300ML IV SOLN
600.0000 mg | Freq: Two times a day (BID) | INTRAVENOUS | Status: DC
  Administered 2023-11-01 – 2023-11-02 (×3): 600 mg via INTRAVENOUS
  Filled 2023-11-01 (×3): qty 300

## 2023-11-01 MED ORDER — LINEZOLID 600 MG PO TABS
600.0000 mg | ORAL_TABLET | Freq: Two times a day (BID) | ORAL | Status: DC
  Filled 2023-11-01: qty 1

## 2023-11-01 NOTE — Progress Notes (Signed)
 Speech Language Pathology Treatment: Cognitive-Linguistic  Patient Details Name: Connor Dixon MRN: [id number] DOB: [birth year] Today's Date: 11/01/2023 Time: 5284-1324 SLP Time Calculation (min) (ACUTE ONLY): 21 min  Assessment / Plan / Recommendation Clinical Impression  Pt is making progress towards aphasia goals (son Landon Pinion present for session). Mr. Heritage has longer periods of alertness and was able to sustain attention with mild cues for 21 min before started to get sleepy. Significant increase in pt's social/automatic utterances that are appropriate. He intermittently asks questions to son but overall continues to have difficulty expressing meaningful thoughts or responses more than automatic/social language. He stated his first and last name to tech and first name to SLP, stated age as 89, unable to state birthday. He independently named common objects with 80% acc.(4/5). Attempted matching word to field of 2 objects was unsuccessful but pointed to named object correctly in 1 of 3 trials. He read written word accurately x 3. He could not follow a one step verbal command, written cues ineffective and he did not imitate therapist. Provided feedback on pt's progress and education with son. ST to continue.    HPI HPI: Pt is 8 year old presented to [facility] on  10/10/23 after found at home unresponsive. Pt with dense encephalopathy with flexor positioning of UE's. CT negative for acute changes and MRI reports No acute intracranial abnormality by noncontrast MRI.  2. Signal changes compatible with chronic small vessel disease. Generalized appearing brain volume loss.  PMH - afib on Eliquis , pacer, HTN, CKD, DM, obesity      SLP Plan  Continue with current plan of care          Recommendations                     Oral care BID   Frequent or constant Supervision/Assistance Dysphagia, unspecified (R13.10);Aphasia (R47.01)     Continue with current plan of care     [name]  11/01/2023, 1:39 PM

## 2023-11-01 NOTE — Progress Notes (Addendum)
 PROGRESS NOTE  Connor Dixon EAV:[id number] DOB: 1935-05-15 DOA: 10/10/2023 PCP: Arcadio Knuckles, MD   LOS: 22 days   Brief Narrative / Interim history: 89 year old with past medical history significant for chronic diastolic heart failure, complete heart block status post pacemaker, CKD 3, diabetes, hypertension, hyperlipidemia who was admitted after being found slumped over in his chair at home and unresponsive.  Last known well was the evening prior to admission on 10/09/2023.  Baseline he is considered high functioning and ambulates independently and lives alone. Evaluation consistent with CT head no acute process.  CTA head and neck demonstrated no large vessel occlusion.  There was evidence of a remote lacunar infarct right corona radiata.  MRI was negative for acute intracranial abnormality.  Evidence for chronic small vessels disease.  EEG unremarkable.  LP performed 10/13/2023 has  ruled out meningitis.  Antibiotics for meningitis were discontinued by neurology.  Patient received a steroid to cover Autoimmune component and subsequently discontinued 10/14/2023. He also completed IV IG for Autoimmune encephalopathy. Serum auto - immune panel performed at [facility] was negative. CSF Autoimmune panel sent to [facility] clinic supposed to be back today, will call this afternoon, [phone], account # [id number]  Subjective / 24h Interval events: He is opening his eyes for me, but mumbles and is unable to have a meaningful conversation or answer my questions  Assesement and Plan: Principal Problem:   Acute encephalopathy Active Problems:   Hyperlipidemia with target LDL less than 70   Essential hypertension   Sinus node dysfunction (HCC)   Insulin -requiring or dependent type II diabetes mellitus (HCC)   Paroxysmal atrial fibrillation (HCC)   Elevated troponin   Prolonged QT interval   Palliative care by specialist  Principal problem Acute Metabolic Encephalopathy - MRI Negative for acute stroke,  EEG no acute seizures.  LP- for meningitis. -Completed 5 days of IVIG on 6//2025 not significant improvement noted. -Ammonia 22, ACTH  test negative, Thiamine  107, HIV non reactive, RPR non reactive, ANA negative, VDRL CSF negative, no oligoclonal band seen on CSF, meningitis encephalitis panel negative, CSF culture negative. Blood culture negative, TSH: 1.0 - Repeated MRI 6/3 with contrast was negative for acute finding, mention chronic small vessel disease, moderate generalized atrophy and white matter disease bilaterally.  Chronic CVA. - B12 low normal (270) getting intramuscular injection .  - Still in the differential of rapid neurocognitive decline from frontotemporal dementia versus global cognitive decline.   -Auto-immune panel Serum - Autoimmune  encephalopathy antibody negative,  Anti-Hu antibody negative, antineuronal antibody negative,  anti-Yo antibody negative,Amphiphysin Antibody Negative, mGluR1 Antibody Negative, Ma2/Ta Antibody Negative, GABA-B-R Antibody    Negative.    -CSF: Autoimmune panel sent to [facility] per [facility] that will be resulted hopefully by the end of the day today -I sent referral to out patient movement disorder at [facility].  -MS stable, fluctuates.  -Check Nocturnal Pulse Oxymetry. He had 4 desaturations with Oxygen sat 72--80 from 14 to 104 seconds , place on Nocturnal oxygen    Active problems Suspected cognitive impairment, POA - Patient had some more forgetfulness with directions, cooking/prepping meals, finances, and not driving lawn mower well . Could be possible that cognitive impairment became acutely worse in setting of acute illness. Referral to [facility] made.   BL [place] - Flutter valve. Incentive spirometry. Duo-neb TID.   Acute kidney injury on chronic CKD 3 - Baseline Appears to be 1.5-1.6.  Creatinine peaked to 2.5, now back to baseline after receiving IV fluids  Fever -6/17, cultures were sent, started on linezolid and Unasyn.  For  now continue, will drop linezolid by tomorrow if blood cultures remain negative  Insulin -dependent diabetes type 2 -Last A1c 8.7% on 09/06/2023. Some intermittent hypoglycemia initially, now resolved.   Lab Results  Component Value Date   HGBA1C 8.7 (H) 09/06/2023   CBG (last 3)  Recent Labs    10/31/23 2111 11/01/23 0624 11/01/23 0747  GLUCAP 281* 222* 221*   Essential hypertension - Continue with amlodipine , blood pressure is stable   Elevated troponin - Echo unremarkable, likely demand ischemia   Acute urinary retention -Foley discontinued 6/2, good urinary output.  Started on Flomax   Hyperlipidemia - not able to tolerate regular statin   Prolonged QT, Sinus node dysfunction (HCC) - s/p pacemaker   Rash chest left shoulder - on Bacitracin    Pain with Urination -UA no significant WBC, No nitrates. Urine culture. Insignificant growth. Renal US ; no hydronephrosis, circumferential of bladder cystitis vs Outlet obstruction. Discussed with Urology, bladder thickening might be from age as well. Agree with FU of urine culture. Could use tamsulosin  if pain reoccur. FU out patient with urology/. Renal cyst on US , follow out patient.    Scheduled Meds:  amLODipine   10 mg Oral Daily   apixaban   2.5 mg Oral BID   bacitracin    Topical BID   vitamin B-12  1,000 mcg Oral Daily   feeding supplement (GLUCERNA SHAKE)  237 mL Oral TID BM   ferrous sulfate  325 mg Oral Q breakfast   fluticasone  1 spray Each Nare Daily   folic acid   1 mg Oral Daily   insulin  aspart  0-5 Units Subcutaneous QHS   insulin  aspart  0-9 Units Subcutaneous TID WC   insulin  aspart  3 Units Subcutaneous TID WC   insulin  glargine-yfgn  14 Units Subcutaneous Daily   metoprolol  tartrate  12.5 mg Oral BID   sodium bicarbonate  1,300 mg Oral BID   tamsulosin  0.4 mg Oral Daily   Continuous Infusions:  ampicillin -sulbactam (UNASYN) IV 3 g (11/01/23 0242)   linezolid (ZYVOX) IV Stopped (10/31/23 2149)   PRN  Meds:.acetaminophen  **OR** acetaminophen  (TYLENOL ) oral liquid 160 mg/5 mL **OR** acetaminophen , artificial tears, hydrALAZINE , ipratropium-albuterol, mouth rinse  Current Outpatient Medications  Medication Instructions   allopurinol  (ZYLOPRIM ) 100 MG tablet TAKE 1 TABLET(100 MG) BY MOUTH DAILY   amoxicillin  (AMOXIL ) 500 MG capsule TAKE ONE CAPSULE BY MOUTH THREE TIMES DAILY. TAKE ONLY WHEN HAVING DENTAL PROCEDURES   apixaban  (ELIQUIS ) 2.5 mg, Oral, 2 times daily   calcitRIOL  (ROCALTROL ) 0.25 MCG capsule TAKE 1 CAPSULE BY MOUTH DAILY RETURN IN ABOUT 6 MONTHS(AROUND 02/04/2023)   carvedilol  (COREG ) 6.25 MG tablet TAKE 1 TABLET(6.25 MG) BY MOUTH TWICE DAILY WITH A MEAL   Continuous Glucose Sensor (DEXCOM G7 SENSOR) MISC 1 Act, Does not apply, Daily   furosemide  (LASIX ) 40 MG tablet Take 2 tablets in the morning and 1 tablet in the afternoon.   Glucagon  (GVOKE HYPOPEN  2-PACK) 1 MG/0.2ML SOAJ 1 Act, Subcutaneous, Daily PRN   insulin  isophane & regular human KwikPen (HUMULIN  70/30 KWIKPEN) (70-30) 100 UNIT/ML KwikPen 40 units in AM and 25 units in PM   levocetirizine (XYZAL ) 5 mg, Oral, Every evening   loratadine (CLARITIN) 10 mg, Oral, Daily PRN   losartan  (COZAAR ) 25 mg, Oral, Daily   pravastatin  (PRAVACHOL ) 40 mg, Oral, Daily, TAKE 1 TABLET(40 MG) BY MOUTH DAILY    Diet Orders (From admission, onward)  Start     Ordered   10/29/23 1708  DIET - DYS 1 Room service appropriate? No; Fluid consistency: Thin  Diet effective now       Comments: Carb Modified.  Question Answer Comment  Room service appropriate? No   Fluid consistency: Thin      10/29/23 1708            DVT prophylaxis: apixaban  (ELIQUIS ) tablet 2.5 mg Start: 10/15/23 1430 SCDs Start: 10/15/23 1335 SCD's Start: 10/10/23 2338 apixaban  (ELIQUIS ) tablet 2.5 mg   Lab Results  Component Value Date   PLT 189 10/31/2023      Code Status: Limited: Do not attempt resuscitation (DNR) -DNR-LIMITED -Do Not Intubate/DNI    Family Communication: no family at bedside   Status is: Inpatient Remains inpatient appropriate because: severity of illness  Level of care: Progressive  Consultants:  Neurology   Objective: Vitals:   10/31/23 1952 10/31/23 2330 11/01/23 0338 11/01/23 0829  BP: (!) 132/59 116/61 106/67 125/66  Pulse: 93 82 93 99  Resp:  18 16 18   Temp: 98.7 F (37.1 C) 97.9 F (36.6 C) 99.1 F (37.3 C) 98.8 F (37.1 C)  TempSrc: Oral Axillary Oral Oral  SpO2: 97% 97% 97% 96%  Weight:      Height:        Intake/Output Summary (Last 24 hours) at 11/01/2023 0942 Last data filed at 11/01/2023 0843 Gross per 24 hour  Intake 751.83 ml  Output 850 ml  Net -98.17 ml   Wt Readings from Last 3 Encounters:  10/10/23 91 kg  09/06/23 90.4 kg  05/08/23 92.1 kg    Examination:  Constitutional: NAD Eyes: no scleral icterus ENMT: Mucous membranes are moist.  Neck: normal, supple Respiratory: clear to auscultation bilaterally, no wheezing, no crackles.  Cardiovascular: Regular rate and rhythm, no murmurs / rubs / gallops. No LE edema.  Abdomen: non distended, no tenderness. Bowel sounds positive.  Musculoskeletal: no clubbing / cyanosis.    Data Reviewed: I have independently reviewed following labs and imaging studies   CBC Recent Labs  Lab 10/26/23 0552 10/28/23 0538 10/29/23 0700 10/30/23 0439 10/31/23 0832  WBC 4.9 3.9* 3.6* 3.5* 3.2*  HGB 12.7* 12.1* 11.3* 11.5* 12.0*  HCT 37.6* 35.2* 34.2* 33.9* 35.8*  PLT 180 173 171 182 189  MCV 89.7 90.0 90.2 91.1 90.9  MCH 30.3 30.9 29.8 30.9 30.5  MCHC 33.8 34.4 33.0 33.9 33.5  RDW 14.6 14.6 14.7 14.6 14.7  LYMPHSABS 1.0  --   --  0.9  --   MONOABS 0.7  --   --  0.4  --   EOSABS 0.2  --   --  0.2  --   BASOSABS 0.0  --   --  0.0  --     Recent Labs  Lab 10/28/23 0538 10/29/23 0700 10/30/23 0439 10/31/23 0832 11/01/23 0456  NA 134* 137 134* 134* 134*  K 3.9 4.3 4.2 4.3 4.0  CL 107 106 106 107 105  CO2 22 21* 19* 17* 20*   GLUCOSE 167* 174* 169* 241* 206*  BUN 27* 23 28* 26* 23  CREATININE 1.64* 1.56* 1.74* 1.70* 1.63*  CALCIUM 8.2* 8.4* 8.4* 8.5* 8.0*    ------------------------------------------------------------------------------------------------------------------ No results for input(s): CHOL, HDL, LDLCALC, TRIG, CHOLHDL, LDLDIRECT in the last 72 hours.  Lab Results  Component Value Date   HGBA1C 8.7 (H) 09/06/2023   ------------------------------------------------------------------------------------------------------------------ No results for input(s): TSH, T4TOTAL, T3FREE, THYROIDAB in the last 72 hours.  Invalid  input(s): FREET3  Cardiac Enzymes No results for input(s): CKMB, TROPONINI, MYOGLOBIN in the last 168 hours.  Invalid input(s): CK ------------------------------------------------------------------------------------------------------------------ No results found for: BNP  CBG: Recent Labs  Lab 10/31/23 1226 10/31/23 1606 10/31/23 2111 11/01/23 0624 11/01/23 0747  GLUCAP 199* 244* 281* 222* 221*    Recent Results (from the past 240 hours)  Urine Culture (for pregnant, neutropenic or urologic patients or patients with an indwelling urinary catheter)     Status: Abnormal   Collection Time: 10/28/23  7:04 AM   Specimen: Urine, Clean Catch  Result Value Ref Range Status   Specimen Description URINE, CLEAN CATCH  Final   Special Requests NONE  Final   Culture (A)  Final    <10,000 COLONIES/mL INSIGNIFICANT GROWTH Performed at [facility], [address], [place], [place] [address]    Report Status 10/29/2023 FINAL  Final  MRSA Next Gen by PCR, Nasal     Status: Abnormal   Collection Time: 10/31/23  7:45 AM   Specimen: Nasal Mucosa; Nasal Swab  Result Value Ref Range Status   MRSA by PCR Next Gen DETECTED (A) NOT DETECTED Final    Comment: RESULT CALLED TO, READ BACK BY AND VERIFIED WITH: RN Aminta Kales [id number] AT 940 AM BY  CM (NOTE) The GeneXpert MRSA Assay (FDA approved for NASAL specimens only), is one component of a comprehensive MRSA colonization surveillance program. It is not intended to diagnose MRSA infection nor to guide or monitor treatment for MRSA infections. Test performance is not FDA approved in patients less than 94 years old. Performed at [facility], [address], [place], [place] [address]   Culture, blood (Routine X 2) w Reflex to ID Panel     Status: None (Preliminary result)   Collection Time: 10/31/23  8:32 AM   Specimen: BLOOD LEFT ARM  Result Value Ref Range Status   Specimen Description BLOOD LEFT ARM  Final   Special Requests   Final    BOTTLES DRAWN AEROBIC ONLY Blood Culture results may not be optimal due to an inadequate volume of blood received in culture bottles   Culture   Final    NO GROWTH < 24 HOURS Performed at [facility], [address] [place], [place] [address]    Report Status PENDING  Incomplete  Culture, blood (Routine X 2) w Reflex to ID Panel     Status: None (Preliminary result)   Collection Time: 10/31/23  8:33 AM   Specimen: BLOOD LEFT HAND  Result Value Ref Range Status   Specimen Description BLOOD LEFT HAND  Final   Special Requests   Final    BOTTLES DRAWN AEROBIC ONLY Blood Culture results may not be optimal due to an inadequate volume of blood received in culture bottles   Culture   Final    NO GROWTH < 24 HOURS Performed at [facility], [address], [place], [place] [address]    Report Status PENDING  Incomplete     Radiology Studies: No results found.   Kathlen Para, MD, PhD [facility]  Between 7 am - 7 pm I am available, please contact me via Amion (for emergencies) or Securechat (non urgent messages)  Between 7 pm - 7 am I am not available, please contact night coverage MD/APP via Amion

## 2023-11-01 NOTE — TOC Progression Note (Signed)
 Transition of Care [facility]) - Progression Note    Patient Details  Name: Connor Dixon MRN: [id number] Date of Birth: Feb 18, 1935  Transition of [facility]) CM/SW Contact  Tandy Fam, [place] Phone Number: 11/01/2023, 10:50 AM  Clinical Narrative:   Patient's lab with [facility] should result today. CSW spoke with son, Landon Pinion, to answer questions and discuss expectations for timeframe moving forward, depending on test results. Landon Pinion appreciative of information. CSW provided update to [place], as well. CSW to follow.    Expected Discharge Plan: Skilled Nursing Facility Barriers to Discharge: Insurance Authorization  Expected Discharge Plan and Services                                               Social Determinants of Health (SDOH) Interventions SDOH Screenings   Food Insecurity: Patient Unable To Answer (10/13/2023)  Housing: Patient Unable To Answer (10/13/2023)  Transportation Needs: Patient Unable To Answer (10/13/2023)  Utilities: Patient Unable To Answer (10/13/2023)  Alcohol  Screen: Low Risk  (04/06/2023)  Depression (PHQ2-9): Low Risk  (09/06/2023)  Financial Resource Strain: Low Risk  (04/06/2023)  Physical Activity: Inactive (04/06/2023)  Social Connections: Unknown (10/13/2023)  Stress: No Stress Concern Present (04/06/2023)  Tobacco Use: Low Risk  (10/10/2023)  Health Literacy: Adequate Health Literacy (04/06/2023)    Readmission Risk Interventions     No data to display

## 2023-11-01 NOTE — Progress Notes (Signed)
 Nutrition Follow-up  DOCUMENTATION CODES:   Not applicable  INTERVENTION:  Continue current diet as ordered Feeding assistance Automatic trays Decrease Glucerna Shake po Daily, each supplement provides 220 kcal and 10 grams of protein Obtain new weight   NUTRITION DIAGNOSIS:  Inadequate oral intake related to lethargy/confusion as evidenced by per patient/family report. - Ongoing   GOAL:  Patient will meet greater than or equal to 90% of their needs - Ongoing   MONITOR:  PO intake, Supplement acceptance, I & O's, Labs  REASON FOR ASSESSMENT:  Consult Calorie Count  ASSESSMENT:  Pt with hx of HTN, HLD, DM, CHF, CKD, and PAF presented to ED with AMS  5/27 - Admitted 6/11 - diet downgraded to Dysphagia 1  Pending labs from [facility], discharge plans dependent on results of labs. Per chart, pt alert and oriented to person only; speech mumbled/slurred.  Discussed with RN, pt is eating very well per RN. Pt does need assistance with meal time, but has ate 100% for breakfast and lunch today. Pt accepting most Glucerna shakes, although unsure completion amount. RN shares that there are still some at bedside, so pt likely drinking intermittently.   Meal Intake  6/10: 0-100% x3 meals 6/13: 50% 2 meals 6/16: 100% x1 meal 6/17: 100% x1 meal  Admission Weight: 91 kg No new weight obtained through out admission, will place order to obtain one for trends.   Nutrition Related Medications: Vitamin B12, Ferrous Sulfate, Folic acid , NovoLog  0-9 units TID + 3 units TID, Semglee  14 units daily, IV antibiotics  Labs: Sodium 134, Potassium 4.0, BUN 23, Creatinine 1.63  CBG: 199-281 mg/dL x 24 hrs   UOP: 147 mL + 1 unmeasured occurrence x 24 hrs I/O's: -23.5 mL since admit  Diet Order:   Diet Order             DIET - DYS 1 Room service appropriate? No; Fluid consistency: Thin  Diet effective now                  EDUCATION NEEDS: Not appropriate for education at this  time  Skin:  Skin Assessment: Reviewed RN Assessment  Last BM:  6/17 - Type 6 (large)  Height:  Ht Readings from Last 1 Encounters:  10/25/23 5' 7 (1.702 m)   Weight:  Wt Readings from Last 1 Encounters:  10/10/23 91 kg   Ideal Body Weight:  70 kg  BMI:  Body mass index is 31.42 kg/m.  Estimated Nutritional Needs:  Kcal:  1600-1800 kcal/d Protein:  80-95g/d Fluid:  1.8L/d   Doneta Furbish RD, LDN Clinical Dietitian

## 2023-11-01 NOTE — Progress Notes (Signed)
 Occupational Therapy Treatment Patient Details Name: EUSTACIO ELLEN MRN: [id number] DOB: 04/18/1935 Today's Date: 11/01/2023   History of present illness Pt is 89 year old presented to [facility] on  10/10/23 after found at home unresponsive. Pt with dense encephalopathy with flexor positioning of UE's. LP performed on 10/13/2023 has ruled out meningitis. CT and MRI on 5/29 were negative for acute intercranial abnormality.  PMH - afib on Eliquis , pacer, HTN, CKD, DM, obesity   OT comments  Pt demonstrated slow progress within therapy sessions, today was much better than prior sessions. Pt noted to have increased vocabulary and following most commands and even demonstrating some use of functional objects for ADLs. During transfer attempt with mod A +2 using the stedy pt noted to have BM that continued throughout sessions, was subsequently placed on bed pan for further toileting. Will continue to reinforce active participate in ADLs with new found ability for pt to interact and follow commands. OT to continue to progress pt as able. Patient will benefit from continued inpatient follow up therapy, <3 hours/day       If plan is discharge home, recommend the following:  Two people to help with walking and/or transfers;Two people to help with bathing/dressing/bathroom;Direct supervision/assist for medications management;Direct supervision/assist for financial management;Supervision due to cognitive status;Assist for transportation;Assistance with cooking/housework;Help with stairs or ramp for entrance   Equipment Recommendations  Other (comment) (defer to next level of care)    Recommendations for Other Services      Precautions / Restrictions Precautions Precautions: Fall Recall of Precautions/Restrictions: Impaired Restrictions Weight Bearing Restrictions Per Provider Order: No       Mobility Bed Mobility Overal bed mobility: Needs Assistance Bed Mobility: Sit to Supine, Rolling, Sidelying to  Sit Rolling: Max assist Sidelying to sit: Max assist, +2 for physical assistance, Used rails   Sit to supine: Max assist, +2 for physical assistance        Transfers Overall transfer level: Needs assistance Equipment used: Ambulation equipment used Transfers: Sit to/from Stand Sit to Stand: +2 physical assistance, Mod assist           General transfer comment: upon standing pt noted to have BM, STSx2 from stedy. Pt BM was continous so opted for bed pan use. Transfer via Lift Equipment: Stedy   Balance Overall balance assessment: Needs assistance Sitting-balance support: Feet supported Sitting balance-Leahy Scale: Poor Sitting balance - Comments: Pt progressing to CGA sitting EOB, intermittenent posterior lean needing mod A for balance. Postural control: Posterior lean Standing balance support: Bilateral upper extremity supported Standing balance-Leahy Scale: Zero Standing balance comment: Max A +2                           ADL either performed or assessed with clinical judgement   ADL Overall ADL's : Needs assistance/impaired     Grooming: Wash/dry face;Sitting;Maximal assistance Grooming Details (indicate cue type and reason): Pt demonstrated use of razor (guard ON) without any cues. not able to thoroughly wash face without assist.             Lower Body Dressing: Total assistance;Sitting/lateral leans Lower Body Dressing Details (indicate cue type and reason): to doff/don socks, little effort of initiation with BLEs     Toileting- Clothing Manipulation and Hygiene: Total assistance;+2 for safety/equipment;+2 for physical assistance;Bed level              Extremity/Trunk Assessment Upper Extremity Assessment Upper Extremity Assessment: Difficult to assess due  to impaired cognition RUE Deficits / Details: AROM WFL, touch top of head, strong grip strength. LUE Deficits / Details: Pt guaring L shoulder, AROM to reach stedy bars. Strong grip. AROM  shoulder flexion at least 50 deg to raise arms for gait belt.       Cervical / Trunk Assessment Cervical / Trunk Assessment: Kyphotic    Vision   Alignment/Gaze Preference: Within Defined Limits Convergence: Within functional limits Additional Comments: Impaired attention, not able to fully assess cog. not able to maintain eye gaze on visual stimuli but eyes will shift across midline. Unsure if pt has diplopia, reported two when asked how many pencils he saw, but accurately displayed two fingers when OT held up two fingers. Difficulty ascertaining secondary to impaired communication.   Perception Perception Perception: Not tested   Praxis Praxis Praxis: Impaired Praxis Impairment Details: Initiation;Motor planning Praxis-Other Comments: Pt demonstrated appropriate use of razor and wash cloth when prompted to give demonstration of function.   Communication Communication Communication: Impaired Factors Affecting Communication: Difficulty expressing self   Cognition Arousal: Alert Behavior During Therapy: Flat affect Cognition: Cognition impaired   Orientation impairments:  (Not able to fully state at this time) Awareness: Intellectual awareness impaired, Online awareness impaired Memory impairment (select all impairments): Declarative long-term memory, Working memory Attention impairment (select first level of impairment): Sustained attention, Focused attention Executive functioning impairment (select all impairments): Initiation, Sequencing, Reasoning, Organization, Problem solving OT - Cognition Comments: pt with increased vocabulary today. Stating his son's name and also following 70% of commands with cueing. Vocab is more extensive with familiar people                 Following commands: Impaired Following commands impaired: Follows one step commands inconsistently, Follows one step commands with increased time      Cueing   Cueing Techniques: Gestural cues, Verbal  cues, Tactile cues, Visual cues  Exercises      Shoulder Instructions       General Comments Pt positioned on bed pan, notified NT    Pertinent Vitals/ Pain       Pain Assessment Pain Assessment: Faces Faces Pain Scale: Hurts little more Pain Location: L shoulder, Pt continously rubbing area Pain Descriptors / Indicators: Guarding, Grimacing Pain Intervention(s): Monitored during session, Limited activity within patient's tolerance, Repositioned  Home Living                                          Prior Functioning/Environment              Frequency  Min 2X/week        Progress Toward Goals  OT Goals(current goals can now be found in the care plan section)  Progress towards OT goals: Progressing toward goals (slow progression)  Acute Rehab OT Goals Patient Stated Goal: Per family, for pt to have opportunities to move and stand ad lib OT Goal Formulation: With family Time For Goal Achievement: 11/15/23 Potential to Achieve Goals: Fair ADL Goals Additional ADL Goal #1: Pt will follow a two step command without cueing Additional ADL Goal #2: Pt will appropriately initiate 3/3 ADLs without cueing.  Plan      Co-evaluation                 AM-PAC OT 6 Clicks Daily Activity     Outcome Measure   Help from another person eating  meals?: A Lot Help from another person taking care of personal grooming?: A Lot Help from another person toileting, which includes using toliet, bedpan, or urinal?: Total Help from another person bathing (including washing, rinsing, drying)?: A Lot Help from another person to put on and taking off regular upper body clothing?: Total Help from another person to put on and taking off regular lower body clothing?: Total 6 Click Score: 9    End of Session Equipment Utilized During Treatment: Gait belt;Other (comment) (stedy)  OT Visit Diagnosis: Other abnormalities of gait and mobility (R26.89);Unsteadiness on  feet (R26.81);Other symptoms and signs involving cognitive function;Pain;Cognitive communication deficit (R41.841);Apraxia (R48.2) Pain - Right/Left: Left Pain - part of body: Shoulder   Activity Tolerance Patient tolerated treatment well   Patient Left in bed;with call bell/phone within reach;with bed alarm set   Nurse Communication Mobility status        Time: 2130-8657 OT Time Calculation (min): 39 min  Charges: OT General Charges $OT Visit: 1 Visit OT Treatments $Self Care/Home Management : 8-22 mins $Therapeutic Activity: 23-37 mins  11/01/2023  AB, OTR/L  Acute Rehabilitation Services  Office: [phone]   Jorene New 11/01/2023, 2:07 PM

## 2023-11-02 DIAGNOSIS — G9340 Encephalopathy, unspecified: Secondary | ICD-10-CM | POA: Diagnosis not present

## 2023-11-02 LAB — CBC WITH DIFFERENTIAL/PLATELET
Abs Immature Granulocytes: 0.02 10*3/uL (ref 0.00–0.07)
Basophils Absolute: 0 10*3/uL (ref 0.0–0.1)
Basophils Relative: 1 %
Eosinophils Absolute: 0.4 10*3/uL (ref 0.0–0.5)
Eosinophils Relative: 10 %
HCT: 32.9 % — ABNORMAL LOW (ref 39.0–52.0)
Hemoglobin: 11.2 g/dL — ABNORMAL LOW (ref 13.0–17.0)
Immature Granulocytes: 1 %
Lymphocytes Relative: 28 %
Lymphs Abs: 1.1 10*3/uL (ref 0.7–4.0)
MCH: 30.4 pg (ref 26.0–34.0)
MCHC: 34 g/dL (ref 30.0–36.0)
MCV: 89.4 fL (ref 80.0–100.0)
Monocytes Absolute: 0.7 10*3/uL (ref 0.1–1.0)
Monocytes Relative: 17 %
Neutro Abs: 1.6 10*3/uL — ABNORMAL LOW (ref 1.7–7.7)
Neutrophils Relative %: 43 %
Platelets: 227 10*3/uL (ref 150–400)
RBC: 3.68 MIL/uL — ABNORMAL LOW (ref 4.22–5.81)
RDW: 14.6 % (ref 11.5–15.5)
WBC: 3.8 10*3/uL — ABNORMAL LOW (ref 4.0–10.5)
nRBC: 0 % (ref 0.0–0.2)

## 2023-11-02 LAB — GLUCOSE, CAPILLARY
Glucose-Capillary: 113 mg/dL — ABNORMAL HIGH (ref 70–99)
Glucose-Capillary: 253 mg/dL — ABNORMAL HIGH (ref 70–99)
Glucose-Capillary: 255 mg/dL — ABNORMAL HIGH (ref 70–99)
Glucose-Capillary: 192 mg/dL — ABNORMAL HIGH (ref 70–99)

## 2023-11-02 LAB — COMPREHENSIVE METABOLIC PANEL WITH GFR
ALT: 25 U/L (ref 0–44)
AST: 36 U/L (ref 15–41)
Albumin: 2 g/dL — ABNORMAL LOW (ref 3.5–5.0)
Alkaline Phosphatase: 56 U/L (ref 38–126)
Anion gap: 8 (ref 5–15)
BUN: 23 mg/dL (ref 8–23)
CO2: 22 mmol/L (ref 22–32)
Calcium: 8.2 mg/dL — ABNORMAL LOW (ref 8.9–10.3)
Chloride: 104 mmol/L (ref 98–111)
Creatinine, Ser: 1.81 mg/dL — ABNORMAL HIGH (ref 0.61–1.24)
GFR, Estimated: 36 mL/min — ABNORMAL LOW (ref >60)
Glucose, Bld: 95 mg/dL (ref 70–99)
Potassium: 3.7 mmol/L (ref 3.5–5.1)
Sodium: 134 mmol/L — ABNORMAL LOW (ref 135–145)
Total Bilirubin: 0.6 mg/dL (ref 0.0–1.2)
Total Protein: 5.9 g/dL — ABNORMAL LOW (ref 6.5–8.1)

## 2023-11-02 LAB — MAGNESIUM: Magnesium: 1.8 mg/dL (ref 1.7–2.4)

## 2023-11-02 MED ORDER — MUPIROCIN 2 % EX OINT
1.0000 | TOPICAL_OINTMENT | Freq: Two times a day (BID) | CUTANEOUS | Status: AC
  Administered 2023-11-02 – 2023-11-07 (×9): 1 via NASAL
  Filled 2023-11-02 (×2): qty 22

## 2023-11-02 MED ORDER — CHLORHEXIDINE GLUCONATE CLOTH 2 % EX PADS
6.0000 | MEDICATED_PAD | Freq: Every day | CUTANEOUS | Status: DC
  Administered 2023-11-04 – 2023-11-06 (×3): 6 via TOPICAL

## 2023-11-02 NOTE — Plan of Care (Signed)
   Medical records reviewed including progress notes, labs and imaging. Patient was last seen by PMT on 6/10 and plan was for continuation of supportive care then revisit GOC based on CSF autoimmune lab results from [facility].  PMT has been following peripherally and aware that labs have not resulted yet (were expected 6/18). Will continue to follow for opportunity to continue discussions after this is available.  Thank you for your referral and allowing PMT to assist in Connor Dixon's care.   Kyler Lerette, PA-C Palliative Medicine Team  Team Phone # [phone]   NO CHARGE

## 2023-11-02 NOTE — Progress Notes (Signed)
 PROGRESS NOTE  Connor Dixon ZOX:[id number] DOB: [birth year] DOA: 10/10/2023 PCP: Arcadio Knuckles, MD   LOS: 23 days   Brief Narrative / Interim history: 89 year old with past medical history significant for chronic diastolic heart failure, complete heart block status post pacemaker, CKD 3, diabetes, hypertension, hyperlipidemia who was admitted after being found slumped over in his chair at home and unresponsive.  Last known well was the evening prior to admission on 10/09/2023.  Baseline he is considered high functioning and ambulates independently and lives alone. Evaluation consistent with CT head no acute process.  CTA head and neck demonstrated no large vessel occlusion.  There was evidence of a remote lacunar infarct right corona radiata.  MRI was negative for acute intracranial abnormality.  Evidence for chronic small vessels disease.  EEG unremarkable.  LP performed 10/13/2023 has  ruled out meningitis.  Antibiotics for meningitis were discontinued by neurology.  Patient received a steroid to cover Autoimmune component and subsequently discontinued 10/14/2023. He also completed IV IG for Autoimmune encephalopathy. Serum auto - immune panel performed at [facility] was negative. CSF Autoimmune panel sent to [facility] clinic supposed to be back today, will call this afternoon, [phone], account # [id number]  Subjective / 24h Interval events: He is opening his eyes but remains repetitive, answering yes to all my questions.  Assesement and Plan: Principal Problem:   Acute encephalopathy Active Problems:   Hyperlipidemia with target LDL less than 70   Essential hypertension   Sinus node dysfunction (HCC)   Insulin -requiring or dependent type II diabetes mellitus (HCC)   Paroxysmal atrial fibrillation (HCC)   Elevated troponin   Prolonged QT interval   Palliative care by specialist  Principal problem Acute Metabolic Encephalopathy - MRI Negative for acute stroke, EEG no acute seizures.  LP-  for meningitis. -Completed 5 days of IVIG on 6//2025 not significant improvement noted. -Ammonia 22, ACTH  test negative, Thiamine  107, HIV non reactive, RPR non reactive, ANA negative, VDRL CSF negative, no oligoclonal band seen on CSF, meningitis encephalitis panel negative, CSF culture negative. Blood culture negative, TSH: 1.0 - Repeated MRI 6/3 with contrast was negative for acute finding, mention chronic small vessel disease, moderate generalized atrophy and white matter disease bilaterally.  Chronic CVA. - B12 low normal (270) getting intramuscular injection .  - Still in the differential of rapid neurocognitive decline from frontotemporal dementia versus global cognitive decline.   -Auto-immune panel Serum - Autoimmune  encephalopathy antibody negative,  Anti-Hu antibody negative, antineuronal antibody negative,  anti-Yo antibody negative,Amphiphysin Antibody Negative, mGluR1 Antibody Negative, Ma2/Ta Antibody Negative, GABA-B-R Antibody    Negative.    -CSF: Autoimmune panel sent to [facility] per [facility] that should result 1 of these days -I sent referral to out patient movement disorder at [facility].  -MS stable, fluctuates.  -Check Nocturnal Pulse Oxymetry. He had 4 desaturations with Oxygen sat 72--80 from 14 to 104 seconds , place on Nocturnal oxygen    Active problems Suspected cognitive impairment, POA - Patient had some more forgetfulness with directions, cooking/prepping meals, finances, and not driving lawn mower well . Could be possible that cognitive impairment became acutely worse in setting of acute illness. Referral to [facility] made.   BL Ronchus - Flutter valve. Incentive spirometry. Duo-neb TID.   Acute kidney injury on chronic CKD 3 - Baseline Appears to be 1.5-1.6.  Creatinine peaked to 2.5, now close to baseline but varying day today  Fever -6/17, cultures were sent, started on linezolid and Unasyn.  Possibly aspiration, discontinue linezolid and continue  Unasyn alone  Insulin -dependent diabetes type 2 -Last A1c 8.7% on 09/06/2023. Some intermittent hypoglycemia initially, now resolved.   Lab Results  Component Value Date   HGBA1C 8.7 (H) 09/06/2023   CBG (last 3)  Recent Labs    11/01/23 1337 11/01/23 1649 11/02/23 0846  GLUCAP 276* 261* 113*   Essential hypertension - Continue with amlodipine , blood pressure is stable   Elevated troponin - Echo unremarkable, likely demand ischemia   Acute urinary retention -Foley discontinued 6/2, good urinary output.  Started on Flomax   Hyperlipidemia - not able to tolerate regular statin   Prolonged QT, Sinus node dysfunction (HCC) - s/p pacemaker   Rash chest left shoulder - on Bacitracin    Pain with Urination -UA no significant WBC, No nitrates. Urine culture. Insignificant growth. Renal US ; no hydronephrosis, circumferential of bladder cystitis vs Outlet obstruction. Discussed with Urology, bladder thickening might be from age as well. Agree with FU of urine culture. Could use tamsulosin  if pain reoccur. FU out patient with urology/. Renal cyst on US , follow out patient.    Scheduled Meds:  amLODipine   10 mg Oral Daily   apixaban   2.5 mg Oral BID   bacitracin    Topical BID   vitamin B-12  1,000 mcg Oral Daily   feeding supplement (GLUCERNA SHAKE)  237 mL Oral Q24H   ferrous sulfate  325 mg Oral Q breakfast   fluticasone  1 spray Each Nare Daily   folic acid   1 mg Oral Daily   insulin  aspart  0-5 Units Subcutaneous QHS   insulin  aspart  0-9 Units Subcutaneous TID WC   insulin  aspart  3 Units Subcutaneous TID WC   insulin  glargine-yfgn  14 Units Subcutaneous Daily   metoprolol  tartrate  12.5 mg Oral BID   sodium bicarbonate  1,300 mg Oral BID   tamsulosin  0.4 mg Oral Daily   Continuous Infusions:  ampicillin -sulbactam (UNASYN) IV 3 g (11/02/23 0214)   linezolid (ZYVOX) IV 600 mg (11/01/23 2223)   PRN Meds:.acetaminophen  **OR** acetaminophen  (TYLENOL ) oral liquid 160 mg/5 mL  **OR** acetaminophen , artificial tears, hydrALAZINE , ipratropium-albuterol, mouth rinse  Current Outpatient Medications  Medication Instructions   allopurinol  (ZYLOPRIM ) 100 MG tablet TAKE 1 TABLET(100 MG) BY MOUTH DAILY   amoxicillin  (AMOXIL ) 500 MG capsule TAKE ONE CAPSULE BY MOUTH THREE TIMES DAILY. TAKE ONLY WHEN HAVING DENTAL PROCEDURES   apixaban  (ELIQUIS ) 2.5 mg, Oral, 2 times daily   calcitRIOL  (ROCALTROL ) 0.25 MCG capsule TAKE 1 CAPSULE BY MOUTH DAILY RETURN IN ABOUT 6 MONTHS(AROUND 02/04/2023)   carvedilol  (COREG ) 6.25 MG tablet TAKE 1 TABLET(6.25 MG) BY MOUTH TWICE DAILY WITH A MEAL   Continuous Glucose Sensor (DEXCOM G7 SENSOR) MISC 1 Act, Does not apply, Daily   furosemide  (LASIX ) 40 MG tablet Take 2 tablets in the morning and 1 tablet in the afternoon.   Glucagon  (GVOKE HYPOPEN  2-PACK) 1 MG/0.2ML SOAJ 1 Act, Subcutaneous, Daily PRN   insulin  isophane & regular human KwikPen (HUMULIN  70/30 KWIKPEN) (70-30) 100 UNIT/ML KwikPen 40 units in AM and 25 units in PM   levocetirizine (XYZAL ) 5 mg, Oral, Every evening   loratadine (CLARITIN) 10 mg, Oral, Daily PRN   losartan  (COZAAR ) 25 mg, Oral, Daily   pravastatin  (PRAVACHOL ) 40 mg, Oral, Daily, TAKE 1 TABLET(40 MG) BY MOUTH DAILY    Diet Orders (From admission, onward)     Start     Ordered   10/29/23 1708  DIET - DYS 1  Room service appropriate? No; Fluid consistency: Thin  Diet effective now       Comments: Carb Modified.  Question Answer Comment  Room service appropriate? No   Fluid consistency: Thin      10/29/23 1708            DVT prophylaxis: apixaban  (ELIQUIS ) tablet 2.5 mg Start: 10/15/23 1430 SCDs Start: 10/15/23 1335 SCD's Start: 10/10/23 2338 apixaban  (ELIQUIS ) tablet 2.5 mg   Lab Results  Component Value Date   PLT 227 11/02/2023      Code Status: Limited: Do not attempt resuscitation (DNR) -DNR-LIMITED -Do Not Intubate/DNI   Family Communication: no family at bedside, updated son 6/18  Status is:  Inpatient Remains inpatient appropriate because: severity of illness  Level of care: Progressive  Consultants:  Neurology   Objective: Vitals:   11/01/23 2004 11/02/23 0034 11/02/23 0406 11/02/23 0718  BP: 123/75 136/70 (!) 142/97 129/69  Pulse: 94 83 91 93  Resp: 18 18 18 16   Temp: 98.5 F (36.9 C) 98.7 F (37.1 C) 98.8 F (37.1 C) 98.8 F (37.1 C)  TempSrc: Oral Oral Oral Oral  SpO2: 97% 99% 95% 98%  Weight:      Height:        Intake/Output Summary (Last 24 hours) at 11/02/2023 1013 Last data filed at 11/02/2023 0926 Gross per 24 hour  Intake 868.04 ml  Output 1700 ml  Net -831.96 ml   Wt Readings from Last 3 Encounters:  10/10/23 91 kg  09/06/23 90.4 kg  05/08/23 92.1 kg    Examination:  Constitutional: NAD Eyes: lids and conjunctivae normal, no scleral icterus ENMT: mmm Neck: normal, supple Respiratory: clear to auscultation bilaterally, no wheezing, no crackles. Normal respiratory effort.  Cardiovascular: Regular rate and rhythm, no murmurs / rubs / gallops. No LE edema. Abdomen: soft, no distention, no tenderness. Bowel sounds positive.    Data Reviewed: I have independently reviewed following labs and imaging studies   CBC Recent Labs  Lab 10/28/23 0538 10/29/23 0700 10/30/23 0439 10/31/23 0832 11/02/23 0429  WBC 3.9* 3.6* 3.5* 3.2* 3.8*  HGB 12.1* 11.3* 11.5* 12.0* 11.2*  HCT 35.2* 34.2* 33.9* 35.8* 32.9*  PLT 173 171 182 189 227  MCV 90.0 90.2 91.1 90.9 89.4  MCH 30.9 29.8 30.9 30.5 30.4  MCHC 34.4 33.0 33.9 33.5 34.0  RDW 14.6 14.7 14.6 14.7 14.6  LYMPHSABS  --   --  0.9  --  1.1  MONOABS  --   --  0.4  --  0.7  EOSABS  --   --  0.2  --  0.4  BASOSABS  --   --  0.0  --  0.0    Recent Labs  Lab 10/29/23 0700 10/30/23 0439 10/31/23 0832 11/01/23 0456 11/02/23 0429  NA 137 134* 134* 134* 134*  K 4.3 4.2 4.3 4.0 3.7  CL 106 106 107 105 104  CO2 21* 19* 17* 20* 22  GLUCOSE 174* 169* 241* 206* 95  BUN 23 28* 26* 23 23   CREATININE 1.56* 1.74* 1.70* 1.63* 1.81*  CALCIUM 8.4* 8.4* 8.5* 8.0* 8.2*  AST  --   --   --   --  36  ALT  --   --   --   --  25  ALKPHOS  --   --   --   --  56  BILITOT  --   --   --   --  0.6  ALBUMIN  --   --   --   --  2.0*  MG  --   --   --   --  1.8    ------------------------------------------------------------------------------------------------------------------ No results for input(s): CHOL, HDL, LDLCALC, TRIG, CHOLHDL, LDLDIRECT in the last 72 hours.  Lab Results  Component Value Date   HGBA1C 8.7 (H) 09/06/2023   ------------------------------------------------------------------------------------------------------------------ No results for input(s): TSH, T4TOTAL, T3FREE, THYROIDAB in the last 72 hours.  Invalid input(s): FREET3  Cardiac Enzymes No results for input(s): CKMB, TROPONINI, MYOGLOBIN in the last 168 hours.  Invalid input(s): CK ------------------------------------------------------------------------------------------------------------------ No results found for: BNP  CBG: Recent Labs  Lab 11/01/23 0747 11/01/23 1223 11/01/23 1337 11/01/23 1649 11/02/23 0846  GLUCAP 221* 216* 276* 261* 113*    Recent Results (from the past 240 hours)  Urine Culture (for pregnant, neutropenic or urologic patients or patients with an indwelling urinary catheter)     Status: Abnormal   Collection Time: 10/28/23  7:04 AM   Specimen: Urine, Clean Catch  Result Value Ref Range Status   Specimen Description URINE, CLEAN CATCH  Final   Special Requests NONE  Final   Culture (A)  Final    <10,000 COLONIES/mL INSIGNIFICANT GROWTH Performed at [facility], [address] [place], [place] [address]    Report Status 10/29/2023 FINAL  Final  MRSA Next Gen by PCR, Nasal     Status: Abnormal   Collection Time: 10/31/23  7:45 AM   Specimen: Nasal Mucosa; Nasal Swab  Result Value Ref Range Status   MRSA by PCR Next Gen DETECTED  (A) NOT DETECTED Final    Comment: RESULT CALLED TO, READ BACK BY AND VERIFIED WITH: RN Aminta Kales [id number] AT 940 AM BY CM (NOTE) The GeneXpert MRSA Assay (FDA approved for NASAL specimens only), is one component of a comprehensive MRSA colonization surveillance program. It is not intended to diagnose MRSA infection nor to guide or monitor treatment for MRSA infections. Test performance is not FDA approved in patients less than 41 years old. Performed at [facility], [address], [place], [place] [address]   Culture, blood (Routine X 2) w Reflex to ID Panel     Status: None (Preliminary result)   Collection Time: 10/31/23  8:32 AM   Specimen: BLOOD LEFT ARM  Result Value Ref Range Status   Specimen Description BLOOD LEFT ARM  Final   Special Requests   Final    BOTTLES DRAWN AEROBIC ONLY Blood Culture results may not be optimal due to an inadequate volume of blood received in culture bottles   Culture   Final    NO GROWTH 2 DAYS Performed at [facility], [address] [place], [place] [address]    Report Status PENDING  Incomplete  Culture, blood (Routine X 2) w Reflex to ID Panel     Status: None (Preliminary result)   Collection Time: 10/31/23  8:33 AM   Specimen: BLOOD LEFT HAND  Result Value Ref Range Status   Specimen Description BLOOD LEFT HAND  Final   Special Requests   Final    BOTTLES DRAWN AEROBIC ONLY Blood Culture results may not be optimal due to an inadequate volume of blood received in culture bottles   Culture   Final    NO GROWTH 2 DAYS Performed at [facility], [address], [place], [place] [address]    Report Status PENDING  Incomplete     Radiology Studies: No results found.   Kathlen Para, MD, PhD [facility]  Between  7 am - 7 pm I am available, please contact me via Amion (for emergencies) or Securechat (non urgent messages)  Between 7 pm - 7 am I am not available, please contact night coverage MD/APP via  Amion

## 2023-11-02 NOTE — TOC Progression Note (Signed)
 Transition of Care [facility]) - Progression Note    Patient Details  Name: Connor Dixon MRN: [id number] Date of Birth: Oct 07, 1934  Transition of Care [facility]) CM/SW Contact  Tandy Fam, [place] Phone Number: 11/02/2023, 11:09 AM  Clinical Narrative:   Per MD, lab result is still pending. CSW updated [employer]. CSW to follow.    Expected Discharge Plan: Skilled Nursing Facility Barriers to Discharge: Insurance Authorization  Expected Discharge Plan and Services                                               Social Determinants of Health (SDOH) Interventions SDOH Screenings   Food Insecurity: Patient Unable To Answer (10/13/2023)  Housing: Patient Unable To Answer (10/13/2023)  Transportation Needs: Patient Unable To Answer (10/13/2023)  Utilities: Patient Unable To Answer (10/13/2023)  Alcohol  Screen: Low Risk  (04/06/2023)  Depression (PHQ2-9): Low Risk  (09/06/2023)  Financial Resource Strain: Low Risk  (04/06/2023)  Physical Activity: Inactive (04/06/2023)  Social Connections: Unknown (10/13/2023)  Stress: No Stress Concern Present (04/06/2023)  Tobacco Use: Low Risk  (10/10/2023)  Health Literacy: Adequate Health Literacy (04/06/2023)    Readmission Risk Interventions     No data to display

## 2023-11-03 DIAGNOSIS — G9340 Encephalopathy, unspecified: Secondary | ICD-10-CM | POA: Diagnosis not present

## 2023-11-03 LAB — GLUCOSE, CAPILLARY
Glucose-Capillary: 182 mg/dL — ABNORMAL HIGH (ref 70–99)
Glucose-Capillary: 303 mg/dL — ABNORMAL HIGH (ref 70–99)
Glucose-Capillary: 208 mg/dL — ABNORMAL HIGH (ref 70–99)
Glucose-Capillary: 63 mg/dL — ABNORMAL LOW (ref 70–99)
Glucose-Capillary: 133 mg/dL — ABNORMAL HIGH (ref 70–99)

## 2023-11-03 MED ORDER — AMOXICILLIN-POT CLAVULANATE 875-125 MG PO TABS
1.0000 | ORAL_TABLET | Freq: Two times a day (BID) | ORAL | Status: AC
  Administered 2023-11-03 – 2023-11-04 (×3): 1 via ORAL
  Filled 2023-11-03 (×3): qty 1

## 2023-11-03 NOTE — Plan of Care (Signed)
  Problem: Education: Goal: Ability to describe self-care measures that may prevent or decrease complications (Diabetes Survival Skills Education) will improve Outcome: Progressing   Problem: Coping: Goal: Ability to adjust to condition or change in health will improve Outcome: Progressing   Problem: Fluid Volume: Goal: Ability to maintain a balanced intake and output will improve Outcome: Progressing   Problem: Health Behavior/Discharge Planning: Goal: Ability to identify and utilize available resources and services will improve Outcome: Progressing Goal: Ability to manage health-related needs will improve Outcome: Progressing   Problem: Metabolic: Goal: Ability to maintain appropriate glucose levels will improve Outcome: Progressing   Problem: Nutritional: Goal: Maintenance of adequate nutrition will improve Outcome: Not Progressing Goal: Progress toward achieving an optimal weight will improve Outcome: Not Progressing   Problem: Skin Integrity: Goal: Risk for impaired skin integrity will decrease Outcome: Progressing   Problem: Tissue Perfusion: Goal: Adequacy of tissue perfusion will improve Outcome: Progressing   Problem: Coping: Goal: Will verbalize positive feelings about self Outcome: Progressing Goal: Will identify appropriate support needs Outcome: Progressing   Problem: Health Behavior/Discharge Planning: Goal: Ability to manage health-related needs will improve Outcome: Progressing Goal: Goals will be collaboratively established with patient/family Outcome: Progressing   Problem: Self-Care: Goal: Ability to participate in self-care as condition permits will improve Outcome: Progressing Goal: Verbalization of feelings and concerns over difficulty with self-care will improve Outcome: Progressing Goal: Ability to communicate needs accurately will improve Outcome: Progressing   Problem: Nutrition: Goal: Risk of aspiration will decrease Outcome:  Progressing Goal: Dietary intake will improve Outcome: Not Progressing   Problem: Education: Goal: Knowledge of General Education information will improve Description: Including pain rating scale, medication(s)/side effects and non-pharmacologic comfort measures Outcome: Progressing   Problem: Health Behavior/Discharge Planning: Goal: Ability to manage health-related needs will improve Outcome: Not Progressing   Problem: Clinical Measurements: Goal: Ability to maintain clinical measurements within normal limits will improve Outcome: Progressing Goal: Will remain free from infection Outcome: Progressing Goal: Diagnostic test results will improve Outcome: Progressing Goal: Respiratory complications will improve Outcome: Progressing Goal: Cardiovascular complication will be avoided Outcome: Progressing   Problem: Activity: Goal: Risk for activity intolerance will decrease Outcome: Progressing   Problem: Nutrition: Goal: Adequate nutrition will be maintained Outcome: Not Progressing   Problem: Coping: Goal: Level of anxiety will decrease Outcome: Progressing

## 2023-11-03 NOTE — Inpatient Diabetes Management (Signed)
 Inpatient Diabetes Program Recommendations  AACE/ADA: New Consensus Statement on Inpatient Glycemic Control (2015)  Target Ranges:  Prepandial:   less than 140 mg/dL      Peak postprandial:   less than 180 mg/dL (1-2 hours)      Critically ill patients:  140 - 180 mg/dL   Lab Results  Component Value Date   GLUCAP 303 (H) 11/03/2023   HGBA1C 8.7 (H) 09/06/2023    Review of Glycemic Control  Latest Reference Range & Units 11/02/23 08:46 11/02/23 12:19 11/02/23 17:08 11/02/23 21:35 11/03/23 06:36 11/03/23 11:53  Glucose-Capillary 70 - 99 mg/dL 045 (H) 409 (H) 811 (H) 192 (H) 182 (H) 303 (H)  (H): Data is abnormally high  Diabetes history: DM2  Current orders for Inpatient glycemic control: Semglee  14 units every day, Noovlog 0-9 units TID and 0-5 units at bedtime, Novolog  3 units TID  Inpatient Diabetes Program Recommendations:    Might consider increasing meal coverage:  Novolog  5 units TID if he consumes at least 50%.    Thank you, Hays Lipschutz, MSN, CDCES Diabetes Coordinator Inpatient Diabetes Program [phone] (team pager from 8a-5p)

## 2023-11-03 NOTE — Progress Notes (Signed)
 PROGRESS NOTE  KATELYN KOHLMEYER ZOX:[id number] DOB: 09-14-1934 DOA: 10/10/2023 PCP: Arcadio Knuckles, MD   LOS: 24 days   Brief Narrative / Interim history: 89 year old with past medical history significant for chronic diastolic heart failure, complete heart block status post pacemaker, CKD 3, diabetes, hypertension, hyperlipidemia who was admitted after being found slumped over in his chair at home and unresponsive.  Last known well was the evening prior to admission on 10/09/2023.  Baseline he is considered high functioning and ambulates independently and lives alone. Evaluation consistent with CT head no acute process.  CTA head and neck demonstrated no large vessel occlusion.  There was evidence of a remote lacunar infarct right corona radiata.  MRI was negative for acute intracranial abnormality.  Evidence for chronic small vessels disease.  EEG unremarkable.  LP performed 10/13/2023 has  ruled out meningitis.  Antibiotics for meningitis were discontinued by neurology.  Patient received a steroid to cover Autoimmune component and subsequently discontinued 10/14/2023. He also completed IV IG for Autoimmune encephalopathy. Serum auto - immune panel performed at [facility] was negative. CSF Autoimmune panel sent to [facility] clinic supposed to be back today, will call this afternoon, [phone], account # [id number]  Subjective / 24h Interval events: Remains confused, alert  Assesement and Plan: Principal Problem:   Acute encephalopathy Active Problems:   Hyperlipidemia with target LDL less than 70   Essential hypertension   Sinus node dysfunction (HCC)   Insulin -requiring or dependent type II diabetes mellitus (HCC)   Paroxysmal atrial fibrillation (HCC)   Elevated troponin   Prolonged QT interval   Palliative care by specialist  Principal problem Acute Metabolic Encephalopathy - MRI Negative for acute stroke, EEG no acute seizures.  LP- for meningitis. -Completed 5 days of IVIG on 6//2025 not  significant improvement noted. -Ammonia 22, ACTH  test negative, Thiamine  107, HIV non reactive, RPR non reactive, ANA negative, VDRL CSF negative, no oligoclonal band seen on CSF, meningitis encephalitis panel negative, CSF culture negative. Blood culture negative, TSH: 1.0 - Repeated MRI 6/3 with contrast was negative for acute finding, mention chronic small vessel disease, moderate generalized atrophy and white matter disease bilaterally.  Chronic CVA. - B12 low normal (270) getting intramuscular injection .  - Still in the differential of rapid neurocognitive decline from frontotemporal dementia versus global cognitive decline.   -Auto-immune panel Serum - Autoimmune  encephalopathy antibody negative,  Anti-Hu antibody negative, antineuronal antibody negative,  anti-Yo antibody negative,Amphiphysin Antibody Negative, mGluR1 Antibody Negative, Ma2/Ta Antibody Negative, GABA-B-R Antibody    Negative.    -CSF: Autoimmune panel sent to [facility] per [facility] that should result 1 of these days -I sent referral to out patient movement disorder at [facility].  -MS stable, fluctuates.  -Check Nocturnal Pulse Oxymetry. He had 4 desaturations with Oxygen sat 72--80 from 14 to 104 seconds , place on Nocturnal oxygen    Active problems Suspected cognitive impairment, POA - Patient had some more forgetfulness with directions, cooking/prepping meals, finances, and not driving lawn mower well . Could be possible that cognitive impairment became acutely worse in setting of acute illness. Referral to Outpatient [facility] made.   BL Ronchus - Flutter valve. Incentive spirometry. Duo-neb TID.   Acute kidney injury on chronic CKD 3 - Baseline Appears to be 1.5-1.6.  Creatinine peaked to 2.5, now close to baseline but varying day today  Fever -6/17, cultures were sent, started on linezolid and Unasyn.  Possible aspiration, currently narrowed to Augmentin alone for total of 5  days  Insulin -dependent diabetes type 2  -Last A1c 8.7% on 09/06/2023. Some intermittent hypoglycemia initially, now resolved.   Lab Results  Component Value Date   HGBA1C 8.7 (H) 09/06/2023   CBG (last 3)  Recent Labs    11/02/23 1708 11/02/23 2135 11/03/23 0636  GLUCAP 255* 192* 182*   Essential hypertension - Continue with amlodipine , blood pressure remains stable   Elevated troponin - Echo unremarkable, likely demand ischemia   Acute urinary retention -Foley discontinued 6/2, good urinary output.  Started on Flomax   Hyperlipidemia - not able to tolerate regular statin   Prolonged QT, Sinus node dysfunction (HCC) - s/p pacemaker   Rash chest left shoulder - on Bacitracin    Pain with Urination -UA no significant WBC, No nitrates. Urine culture. Insignificant growth. Renal US ; no hydronephrosis, circumferential of bladder cystitis vs Outlet obstruction. Discussed with Urology, bladder thickening might be from age as well. Agree with FU of urine culture. Could use tamsulosin  if pain reoccur. FU out patient with urology/. Renal cyst on US , follow out patient.    Scheduled Meds:  amLODipine   10 mg Oral Daily   amoxicillin -clavulanate  1 tablet Oral Q12H   apixaban   2.5 mg Oral BID   bacitracin    Topical BID   Chlorhexidine  Gluconate Cloth  6 each Topical Daily   vitamin B-12  1,000 mcg Oral Daily   feeding supplement (GLUCERNA SHAKE)  237 mL Oral Q24H   ferrous sulfate  325 mg Oral Q breakfast   fluticasone  1 spray Each Nare Daily   folic acid   1 mg Oral Daily   insulin  aspart  0-5 Units Subcutaneous QHS   insulin  aspart  0-9 Units Subcutaneous TID WC   insulin  aspart  3 Units Subcutaneous TID WC   insulin  glargine-yfgn  14 Units Subcutaneous Daily   metoprolol  tartrate  12.5 mg Oral BID   mupirocin  ointment  1 Application Nasal BID   sodium bicarbonate  1,300 mg Oral BID   tamsulosin  0.4 mg Oral Daily   Continuous Infusions:   PRN Meds:.acetaminophen  **OR** acetaminophen  (TYLENOL ) oral liquid 160 mg/5 mL  **OR** acetaminophen , artificial tears, hydrALAZINE , ipratropium-albuterol, mouth rinse  Current Outpatient Medications  Medication Instructions   allopurinol  (ZYLOPRIM ) 100 MG tablet TAKE 1 TABLET(100 MG) BY MOUTH DAILY   amoxicillin  (AMOXIL ) 500 MG capsule TAKE ONE CAPSULE BY MOUTH THREE TIMES DAILY. TAKE ONLY WHEN HAVING DENTAL PROCEDURES   apixaban  (ELIQUIS ) 2.5 mg, Oral, 2 times daily   calcitRIOL  (ROCALTROL ) 0.25 MCG capsule TAKE 1 CAPSULE BY MOUTH DAILY RETURN IN ABOUT 6 MONTHS(AROUND 02/04/2023)   carvedilol  (COREG ) 6.25 MG tablet TAKE 1 TABLET(6.25 MG) BY MOUTH TWICE DAILY WITH A MEAL   Continuous Glucose Sensor (DEXCOM G7 SENSOR) MISC 1 Act, Does not apply, Daily   furosemide  (LASIX ) 40 MG tablet Take 2 tablets in the morning and 1 tablet in the afternoon.   Glucagon  (GVOKE HYPOPEN  2-PACK) 1 MG/0.2ML SOAJ 1 Act, Subcutaneous, Daily PRN   insulin  isophane & regular human KwikPen (HUMULIN  70/30 KWIKPEN) (70-30) 100 UNIT/ML KwikPen 40 units in AM and 25 units in PM   levocetirizine (XYZAL ) 5 mg, Oral, Every evening   loratadine (CLARITIN) 10 mg, Oral, Daily PRN   losartan  (COZAAR ) 25 mg, Oral, Daily   pravastatin  (PRAVACHOL ) 40 mg, Oral, Daily, TAKE 1 TABLET(40 MG) BY MOUTH DAILY    Diet Orders (From admission, onward)     Start     Ordered   10/29/23 1708  DIET -  DYS 1 Room service appropriate? No; Fluid consistency: Thin  Diet effective now       Comments: Carb Modified.  Question Answer Comment  Room service appropriate? No   Fluid consistency: Thin      10/29/23 1708            DVT prophylaxis: apixaban  (ELIQUIS ) tablet 2.5 mg Start: 10/15/23 1430 SCDs Start: 10/15/23 1335 SCD's Start: 10/10/23 2338 apixaban  (ELIQUIS ) tablet 2.5 mg   Lab Results  Component Value Date   PLT 227 11/02/2023      Code Status: Limited: Do not attempt resuscitation (DNR) -DNR-LIMITED -Do Not Intubate/DNI   Family Communication: no family at bedside, updated son 6/19  Status is:  Inpatient Remains inpatient appropriate because: severity of illness  Level of care: Progressive  Consultants:  Neurology   Objective: Vitals:   11/02/23 1707 11/02/23 2003 11/03/23 0005 11/03/23 0758  BP: 117/61 109/72 121/67 (!) 109/49  Pulse: 84 88 87 92  Resp: 18 18 18 16   Temp: 99.2 F (37.3 C) 98.6 F (37 C) 98.1 F (36.7 C) 98.9 F (37.2 C)  TempSrc: Oral Oral Oral Oral  SpO2: 95% 97% 95% 97%  Weight:      Height:        Intake/Output Summary (Last 24 hours) at 11/03/2023 1124 Last data filed at 11/03/2023 0600 Gross per 24 hour  Intake 120 ml  Output 2100 ml  Net -1980 ml   Wt Readings from Last 3 Encounters:  10/10/23 91 kg  09/06/23 90.4 kg  05/08/23 92.1 kg    Examination:  Constitutional: NAD Eyes: lids and conjunctivae normal, no scleral icterus ENMT: mmm Neck: normal, supple Respiratory: clear to auscultation bilaterally, no wheezing, no crackles. Normal respiratory effort.  Cardiovascular: Regular rate and rhythm, no murmurs / rubs / gallops. No LE edema. Abdomen: soft, no distention, no tenderness. Bowel sounds positive.    Data Reviewed: I have independently reviewed following labs and imaging studies   CBC Recent Labs  Lab 10/28/23 0538 10/29/23 0700 10/30/23 0439 10/31/23 0832 11/02/23 0429  WBC 3.9* 3.6* 3.5* 3.2* 3.8*  HGB 12.1* 11.3* 11.5* 12.0* 11.2*  HCT 35.2* 34.2* 33.9* 35.8* 32.9*  PLT 173 171 182 189 227  MCV 90.0 90.2 91.1 90.9 89.4  MCH 30.9 29.8 30.9 30.5 30.4  MCHC 34.4 33.0 33.9 33.5 34.0  RDW 14.6 14.7 14.6 14.7 14.6  LYMPHSABS  --   --  0.9  --  1.1  MONOABS  --   --  0.4  --  0.7  EOSABS  --   --  0.2  --  0.4  BASOSABS  --   --  0.0  --  0.0    Recent Labs  Lab 10/29/23 0700 10/30/23 0439 10/31/23 0832 11/01/23 0456 11/02/23 0429  NA 137 134* 134* 134* 134*  K 4.3 4.2 4.3 4.0 3.7  CL 106 106 107 105 104  CO2 21* 19* 17* 20* 22  GLUCOSE 174* 169* 241* 206* 95  BUN 23 28* 26* 23 23  CREATININE 1.56*  1.74* 1.70* 1.63* 1.81*  CALCIUM 8.4* 8.4* 8.5* 8.0* 8.2*  AST  --   --   --   --  36  ALT  --   --   --   --  25  ALKPHOS  --   --   --   --  56  BILITOT  --   --   --   --  0.6  ALBUMIN  --   --   --   --  2.0*  MG  --   --   --   --  1.8    ------------------------------------------------------------------------------------------------------------------ No results for input(s): CHOL, HDL, LDLCALC, TRIG, CHOLHDL, LDLDIRECT in the last 72 hours.  Lab Results  Component Value Date   HGBA1C 8.7 (H) 09/06/2023   ------------------------------------------------------------------------------------------------------------------ No results for input(s): TSH, T4TOTAL, T3FREE, THYROIDAB in the last 72 hours.  Invalid input(s): FREET3  Cardiac Enzymes No results for input(s): CKMB, TROPONINI, MYOGLOBIN in the last 168 hours.  Invalid input(s): CK ------------------------------------------------------------------------------------------------------------------ No results found for: BNP  CBG: Recent Labs  Lab 11/02/23 0846 11/02/23 1219 11/02/23 1708 11/02/23 2135 11/03/23 0636  GLUCAP 113* 253* 255* 192* 182*    Recent Results (from the past 240 hours)  Urine Culture (for pregnant, neutropenic or urologic patients or patients with an indwelling urinary catheter)     Status: Abnormal   Collection Time: 10/28/23  7:04 AM   Specimen: Urine, Clean Catch  Result Value Ref Range Status   Specimen Description URINE, CLEAN CATCH  Final   Special Requests NONE  Final   Culture (A)  Final    <10,000 COLONIES/mL INSIGNIFICANT GROWTH Performed at [facility], [address], [place], [place] [address]    Report Status 10/29/2023 FINAL  Final  MRSA Next Gen by PCR, Nasal     Status: Abnormal   Collection Time: 10/31/23  7:45 AM   Specimen: Nasal Mucosa; Nasal Swab  Result Value Ref Range Status   MRSA by PCR Next Gen DETECTED (A) NOT DETECTED  Final    Comment: RESULT CALLED TO, READ BACK BY AND VERIFIED WITH: RN Aminta Kales [id number] AT 940 AM BY CM (NOTE) The GeneXpert MRSA Assay (FDA approved for NASAL specimens only), is one component of a comprehensive MRSA colonization surveillance program. It is not intended to diagnose MRSA infection nor to guide or monitor treatment for MRSA infections. Test performance is not FDA approved in patients less than 4 years old. Performed at [facility], [address], [place], [place] [address]   Culture, blood (Routine X 2) w Reflex to ID Panel     Status: None (Preliminary result)   Collection Time: 10/31/23  8:32 AM   Specimen: BLOOD LEFT ARM  Result Value Ref Range Status   Specimen Description BLOOD LEFT ARM  Final   Special Requests   Final    BOTTLES DRAWN AEROBIC ONLY Blood Culture results may not be optimal due to an inadequate volume of blood received in culture bottles   Culture   Final    NO GROWTH 3 DAYS Performed at [facility], [address], [place], [place] [address]    Report Status PENDING  Incomplete  Culture, blood (Routine X 2) w Reflex to ID Panel     Status: None (Preliminary result)   Collection Time: 10/31/23  8:33 AM   Specimen: BLOOD LEFT HAND  Result Value Ref Range Status   Specimen Description BLOOD LEFT HAND  Final   Special Requests   Final    BOTTLES DRAWN AEROBIC ONLY Blood Culture results may not be optimal due to an inadequate volume of blood received in culture bottles   Culture   Final    NO GROWTH 3 DAYS Performed at [facility], [address] [place], [place] [address]    Report Status PENDING  Incomplete     Radiology Studies: No results found.   Kathlen Para, MD, PhD [facility]  Between  7 am - 7 pm I am available, please contact me via Amion (for emergencies) or Securechat (non urgent messages)  Between 7 pm - 7 am I am not available, please contact night coverage MD/APP via Amion

## 2023-11-03 NOTE — Progress Notes (Signed)
    Medical records reviewed including progress notes, labs and imaging. Patient was last seen by PMT on 6/10 and plan was for continuation of supportive care then revisit GOC based on CSF autoimmune lab results from [facility].   PMT has been following peripherally and aware that labs have not resulted yet (were expected 6/18). Will continue to follow for opportunity to continue discussions after this is available.  Thank you for your referral and allowing PMT to assist in Connor Dixon's care.   Joaquim Muir, NP Palliative Medicine Team  Team Phone # [phone]   NO CHARGE

## 2023-11-03 NOTE — Plan of Care (Signed)
  Problem: Health Behavior/Discharge Planning: Goal: Ability to manage health-related needs will improve Outcome: Progressing   Problem: Skin Integrity: Goal: Risk for impaired skin integrity will decrease Outcome: Progressing   Problem: Self-Care: Goal: Ability to communicate needs accurately will improve Outcome: Progressing   Problem: Clinical Measurements: Goal: Diagnostic test results will improve Outcome: Progressing Goal: Respiratory complications will improve Outcome: Progressing

## 2023-11-03 NOTE — Progress Notes (Signed)
 Physical Therapy Treatment Patient Details Name: Connor Dixon MRN: [id number] DOB: 05/02/1935 Today's Date: 11/03/2023   History of Present Illness Pt is 89 year old presented to [facility] on  10/10/23 after found at home unresponsive. Pt with dense encephalopathy with flexor positioning of UE's. LP performed on 10/13/2023 has ruled out meningitis. CT and MRI on 5/29 were negative for acute intercranial abnormality.  PMH - afib on Eliquis , pacer, HTN, CKD, DM, obesity    PT Comments  Pt received in supine and agreeable to session. Pt demonstrates improved alertness and initiation as session progresses. Pt requires increased cues for command following throughout session. Pt able to tolerate multiple stands to stedy, but demonstrates short standing tolerance. Pt demonstrates improved initiation and power up with successive standing trials. Pt with good effort, but limited by impaired activity tolerance. Pt continues to benefit from PT services to progress toward functional mobility goals.    If plan is discharge home, recommend the following: Two people to help with walking and/or transfers;Assistance with cooking/housework;Supervision due to cognitive status;Assist for transportation;Help with stairs or ramp for entrance   Can travel by private vehicle     No  Equipment Recommendations  BSC/3in1;Wheelchair cushion (measurements PT);Wheelchair (measurements PT);Hoyer lift;Hospital bed    Recommendations for Other Services       Precautions / Restrictions Precautions Precautions: Fall Recall of Precautions/Restrictions: Impaired Restrictions Weight Bearing Restrictions Per Provider Order: No     Mobility  Bed Mobility Overal bed mobility: Needs Assistance Bed Mobility: Supine to Sit     Supine to sit: Mod assist, +2 for physical assistance     General bed mobility comments: assist for BLE advancement to EOB, trunk elevation, and scooting forward with bedpad    Transfers Overall  transfer level: Needs assistance Equipment used: Ambulation equipment used Transfers: Sit to/from Stand Sit to Stand: +2 physical assistance, Mod assist           General transfer comment: STS from EOB and recliner with mod A +2 with pt demonstrating improved initiation with more practice. CGA for pt to stand from stedy paddles. cues for upright posture. Pt demonstrates difficulty following cues for knee extension and begins to sit Transfer via Lift Equipment: Stedy  Ambulation/Gait                   Stairs             Wheelchair Mobility     Tilt Bed    Modified Rankin (Stroke Patients Only)       Balance Overall balance assessment: Needs assistance Sitting-balance support: Feet supported Sitting balance-Leahy Scale: Fair Sitting balance - Comments: CGA for static sitting EOB   Standing balance support: Bilateral upper extremity supported, During functional activity, Reliant on assistive device for balance Standing balance-Leahy Scale: Poor Standing balance comment: with stedy support                            Communication Communication Communication: Impaired Factors Affecting Communication: Difficulty expressing self  Cognition Arousal: Alert Behavior During Therapy: Flat affect   PT - Cognitive impairments: Initiation, Attention, Problem solving, Awareness, Safety/Judgement Difficult to assess due to: Impaired communication                       Following commands: Impaired Following commands impaired: Follows one step commands inconsistently, Follows one step commands with increased time    Cueing Cueing Techniques: Tactile  cues, Visual cues, Verbal cues  Exercises      General Comments        Pertinent Vitals/Pain Pain Assessment Pain Assessment: Faces Faces Pain Scale: No hurt Pain Intervention(s): Monitored during session     PT Goals (current goals can now be found in the care plan section) Acute Rehab PT  Goals Patient Stated Goal: improve pt mobility PT Goal Formulation: With family Time For Goal Achievement: 11/10/23 Progress towards PT goals: Progressing toward goals    Frequency    Min 2X/week       AM-PAC PT 6 Clicks Mobility   Outcome Measure  Help needed turning from your back to your side while in a flat bed without using bedrails?: A Lot Help needed moving from lying on your back to sitting on the side of a flat bed without using bedrails?: A Lot Help needed moving to and from a bed to a chair (including a wheelchair)?: Total Help needed standing up from a chair using your arms (e.g., wheelchair or bedside chair)?: Total Help needed to walk in hospital room?: Total Help needed climbing 3-5 steps with a railing? : Total 6 Click Score: 8    End of Session Equipment Utilized During Treatment: Gait belt Activity Tolerance: Patient tolerated treatment well Patient left: in chair;with chair alarm set;with call bell/phone within reach Nurse Communication: Mobility status PT Visit Diagnosis: Other abnormalities of gait and mobility (R26.89);Other symptoms and signs involving the nervous system (R29.898);Muscle weakness (generalized) (M62.81)     Time: 5366-4403 PT Time Calculation (min) (ACUTE ONLY): 17 min  Charges:    $Therapeutic Activity: 8-22 mins PT General Charges $$ ACUTE PT VISIT: 1 Visit                     Michaelle Adolphus, PTA Acute Rehabilitation Services Secure Chat Preferred  [phone]    Michaelle Adolphus 11/03/2023, 10:01 AM

## 2023-11-04 DIAGNOSIS — G9340 Encephalopathy, unspecified: Secondary | ICD-10-CM | POA: Diagnosis not present

## 2023-11-04 LAB — COMPREHENSIVE METABOLIC PANEL WITH GFR
ALT: 33 U/L (ref 0–44)
AST: 50 U/L — ABNORMAL HIGH (ref 15–41)
Albumin: 1.9 g/dL — ABNORMAL LOW (ref 3.5–5.0)
Alkaline Phosphatase: 57 U/L (ref 38–126)
Anion gap: 7 (ref 5–15)
BUN: 21 mg/dL (ref 8–23)
CO2: 25 mmol/L (ref 22–32)
Calcium: 8.2 mg/dL — ABNORMAL LOW (ref 8.9–10.3)
Chloride: 107 mmol/L (ref 98–111)
Creatinine, Ser: 1.68 mg/dL — ABNORMAL HIGH (ref 0.61–1.24)
GFR, Estimated: 39 mL/min — ABNORMAL LOW (ref >60)
Glucose, Bld: 174 mg/dL — ABNORMAL HIGH (ref 70–99)
Potassium: 3.8 mmol/L (ref 3.5–5.1)
Sodium: 139 mmol/L (ref 135–145)
Total Bilirubin: 0.5 mg/dL (ref 0.0–1.2)
Total Protein: 5.8 g/dL — ABNORMAL LOW (ref 6.5–8.1)

## 2023-11-04 LAB — MAGNESIUM: Magnesium: 1.9 mg/dL (ref 1.7–2.4)

## 2023-11-04 LAB — GLUCOSE, CAPILLARY
Glucose-Capillary: 155 mg/dL — ABNORMAL HIGH (ref 70–99)
Glucose-Capillary: 123 mg/dL — ABNORMAL HIGH (ref 70–99)
Glucose-Capillary: 271 mg/dL — ABNORMAL HIGH (ref 70–99)
Glucose-Capillary: 102 mg/dL — ABNORMAL HIGH (ref 70–99)

## 2023-11-04 LAB — CBC
HCT: 36 % — ABNORMAL LOW (ref 39.0–52.0)
Hemoglobin: 11.9 g/dL — ABNORMAL LOW (ref 13.0–17.0)
MCH: 30.1 pg (ref 26.0–34.0)
MCHC: 33.1 g/dL (ref 30.0–36.0)
MCV: 90.9 fL (ref 80.0–100.0)
Platelets: 261 10*3/uL (ref 150–400)
RBC: 3.96 MIL/uL — ABNORMAL LOW (ref 4.22–5.81)
RDW: 14.8 % (ref 11.5–15.5)
WBC: 2.9 10*3/uL — ABNORMAL LOW (ref 4.0–10.5)
nRBC: 0 % (ref 0.0–0.2)

## 2023-11-04 NOTE — Plan of Care (Signed)
 Problem: Education: Goal: Ability to describe self-care measures that may prevent or decrease complications (Diabetes Survival Skills Education) will improve 11/04/2023 1712 by Bernardine Nelwyn DASEN, RN Outcome: Progressing 11/04/2023 1621 by Bernardine Nelwyn DASEN, RN Outcome: Progressing Goal: Individualized Educational Video(s) 11/04/2023 1712 by Bernardine Nelwyn DASEN, RN Outcome: Progressing 11/04/2023 1621 by Bernardine Nelwyn DASEN, RN Outcome: Progressing   Problem: Coping: Goal: Ability to adjust to condition or change in health will improve 11/04/2023 1712 by Bernardine Nelwyn DASEN, RN Outcome: Progressing 11/04/2023 1621 by Bernardine Nelwyn DASEN, RN Outcome: Progressing   Problem: Fluid Volume: Goal: Ability to maintain a balanced intake and output will improve 11/04/2023 1712 by Bernardine Nelwyn DASEN, RN Outcome: Progressing 11/04/2023 1621 by Bernardine Nelwyn DASEN, RN Outcome: Progressing   Problem: Health Behavior/Discharge Planning: Goal: Ability to identify and utilize available resources and services will improve 11/04/2023 1712 by Bernardine Nelwyn DASEN, RN Outcome: Progressing 11/04/2023 1621 by Bernardine Nelwyn DASEN, RN Outcome: Progressing Goal: Ability to manage health-related needs will improve 11/04/2023 1712 by Bernardine Nelwyn DASEN, RN Outcome: Progressing 11/04/2023 1621 by Bernardine Nelwyn DASEN, RN Outcome: Progressing   Problem: Metabolic: Goal: Ability to maintain appropriate glucose levels will improve 11/04/2023 1712 by Bernardine Nelwyn DASEN, RN Outcome: Progressing 11/04/2023 1621 by Bernardine Nelwyn DASEN, RN Outcome: Progressing   Problem: Nutritional: Goal: Maintenance of adequate nutrition will improve 11/04/2023 1712 by Bernardine Nelwyn DASEN, RN Outcome: Progressing 11/04/2023 1621 by Bernardine Nelwyn DASEN, RN Outcome: Progressing Goal: Progress toward achieving an optimal weight will improve 11/04/2023 1712 by Bernardine Nelwyn DASEN, RN Outcome: Progressing 11/04/2023 1621 by Bernardine Nelwyn DASEN, RN Outcome:  Progressing   Problem: Skin Integrity: Goal: Risk for impaired skin integrity will decrease 11/04/2023 1712 by Bernardine Nelwyn DASEN, RN Outcome: Progressing 11/04/2023 1621 by Bernardine Nelwyn DASEN, RN Outcome: Progressing   Problem: Tissue Perfusion: Goal: Adequacy of tissue perfusion will improve 11/04/2023 1712 by Bernardine Nelwyn DASEN, RN Outcome: Progressing 11/04/2023 1621 by Bernardine Nelwyn DASEN, RN Outcome: Progressing   Problem: Coping: Goal: Will verbalize positive feelings about self 11/04/2023 1712 by Bernardine Nelwyn DASEN, RN Outcome: Progressing 11/04/2023 1621 by Bernardine Nelwyn DASEN, RN Outcome: Progressing Goal: Will identify appropriate support needs 11/04/2023 1712 by Bernardine Nelwyn DASEN, RN Outcome: Progressing 11/04/2023 1621 by Bernardine Nelwyn DASEN, RN Outcome: Progressing   Problem: Health Behavior/Discharge Planning: Goal: Ability to manage health-related needs will improve 11/04/2023 1712 by Bernardine Nelwyn DASEN, RN Outcome: Progressing 11/04/2023 1621 by Bernardine Nelwyn DASEN, RN Outcome: Progressing Goal: Goals will be collaboratively established with patient/family 11/04/2023 1712 by Bernardine Nelwyn DASEN, RN Outcome: Progressing 11/04/2023 1621 by Bernardine Nelwyn DASEN, RN Outcome: Progressing   Problem: Self-Care: Goal: Ability to participate in self-care as condition permits will improve 11/04/2023 1712 by Bernardine Nelwyn DASEN, RN Outcome: Progressing 11/04/2023 1621 by Bernardine Nelwyn DASEN, RN Outcome: Progressing Goal: Verbalization of feelings and concerns over difficulty with self-care will improve 11/04/2023 1712 by Bernardine Nelwyn DASEN, RN Outcome: Progressing 11/04/2023 1621 by Bernardine Nelwyn DASEN, RN Outcome: Progressing Goal: Ability to communicate needs accurately will improve 11/04/2023 1712 by Bernardine Nelwyn DASEN, RN Outcome: Progressing 11/04/2023 1621 by Bernardine Nelwyn DASEN, RN Outcome: Progressing   Problem: Nutrition: Goal: Risk of aspiration will decrease 11/04/2023 1712 by Bernardine Nelwyn DASEN, RN Outcome: Progressing 11/04/2023 1621 by Bernardine Nelwyn DASEN, RN Outcome: Progressing Goal: Dietary intake will improve 11/04/2023 1712 by Bernardine Nelwyn DASEN, RN Outcome: Progressing 11/04/2023 1621 by Bernardine Nelwyn DASEN, RN Outcome: Progressing   Problem: Education: Goal: Knowledge of General Education  information will improve Description: Including pain rating scale, medication(s)/side effects and non-pharmacologic comfort measures 11/04/2023 1712 by Bernardine Nelwyn DASEN, RN Outcome: Progressing 11/04/2023 1621 by Bernardine Nelwyn DASEN, RN Outcome: Progressing   Problem: Health Behavior/Discharge Planning: Goal: Ability to manage health-related needs will improve 11/04/2023 1712 by Bernardine Nelwyn DASEN, RN Outcome: Progressing 11/04/2023 1621 by Bernardine Nelwyn DASEN, RN Outcome: Progressing   Problem: Clinical Measurements: Goal: Ability to maintain clinical measurements within normal limits will improve 11/04/2023 1712 by Bernardine Nelwyn DASEN, RN Outcome: Progressing 11/04/2023 1621 by Bernardine Nelwyn DASEN, RN Outcome: Progressing Goal: Will remain free from infection 11/04/2023 1712 by Bernardine Nelwyn DASEN, RN Outcome: Progressing 11/04/2023 1621 by Bernardine Nelwyn DASEN, RN Outcome: Progressing Goal: Diagnostic test results will improve 11/04/2023 1712 by Bernardine Nelwyn DASEN, RN Outcome: Progressing 11/04/2023 1621 by Bernardine Nelwyn DASEN, RN Outcome: Progressing Goal: Respiratory complications will improve 11/04/2023 1712 by Bernardine Nelwyn DASEN, RN Outcome: Progressing 11/04/2023 1621 by Bernardine Nelwyn DASEN, RN Outcome: Progressing Goal: Cardiovascular complication will be avoided 11/04/2023 1712 by Bernardine Nelwyn DASEN, RN Outcome: Progressing 11/04/2023 1621 by Bernardine Nelwyn DASEN, RN Outcome: Progressing   Problem: Activity: Goal: Risk for activity intolerance will decrease 11/04/2023 1712 by Bernardine Nelwyn DASEN, RN Outcome: Progressing 11/04/2023 1621 by Bernardine Nelwyn DASEN, RN Outcome: Progressing    Problem: Nutrition: Goal: Adequate nutrition will be maintained 11/04/2023 1712 by Bernardine Nelwyn DASEN, RN Outcome: Progressing 11/04/2023 1621 by Bernardine Nelwyn DASEN, RN Outcome: Progressing   Problem: Coping: Goal: Level of anxiety will decrease 11/04/2023 1712 by Bernardine Nelwyn DASEN, RN Outcome: Progressing 11/04/2023 1621 by Bernardine Nelwyn DASEN, RN Outcome: Progressing   Problem: Elimination: Goal: Will not experience complications related to bowel motility 11/04/2023 1712 by Bernardine Nelwyn DASEN, RN Outcome: Progressing 11/04/2023 1621 by Bernardine Nelwyn DASEN, RN Outcome: Progressing Goal: Will not experience complications related to urinary retention 11/04/2023 1712 by Bernardine Nelwyn DASEN, RN Outcome: Progressing 11/04/2023 1621 by Bernardine Nelwyn DASEN, RN Outcome: Progressing   Problem: Pain Managment: Goal: General experience of comfort will improve and/or be controlled 11/04/2023 1712 by Bernardine Nelwyn DASEN, RN Outcome: Progressing 11/04/2023 1621 by Bernardine Nelwyn DASEN, RN Outcome: Progressing   Problem: Safety: Goal: Ability to remain free from injury will improve 11/04/2023 1712 by Bernardine Nelwyn DASEN, RN Outcome: Progressing 11/04/2023 1621 by Bernardine Nelwyn DASEN, RN Outcome: Progressing   Problem: Skin Integrity: Goal: Risk for impaired skin integrity will decrease 11/04/2023 1712 by Bernardine Nelwyn DASEN, RN Outcome: Progressing 11/04/2023 1621 by Bernardine Nelwyn DASEN, RN Outcome: Progressing

## 2023-11-04 NOTE — Progress Notes (Signed)
 PROGRESS NOTE  Connor Dixon FMW:[id number] DOB: 07/13/1934 DOA: 10/10/2023 PCP: [name], MD   LOS: 25 days   Brief Narrative / Interim history: 89 year old with past medical history significant for chronic diastolic heart failure, complete heart block status post pacemaker, CKD 3, diabetes, hypertension, hyperlipidemia who was admitted after being found slumped over in his chair at home and unresponsive.  Last known well was the evening prior to admission on 10/09/2023.  Baseline he is considered high functioning and ambulates independently and lives alone. Evaluation consistent with CT head no acute process.  CTA head and neck demonstrated no large vessel occlusion.  There was evidence of a remote lacunar infarct right corona radiata.  MRI was negative for acute intracranial abnormality.  Evidence for chronic small vessels disease.  EEG unremarkable.  LP performed 10/13/2023 has  ruled out meningitis.  Antibiotics for meningitis were discontinued by neurology.  Patient received a steroid to cover Autoimmune component and subsequently discontinued 10/14/2023. He also completed IV IG for Autoimmune encephalopathy. Serum auto - immune panel performed at [facility] was negative. CSF Autoimmune panel sent to [facility] clinic supposed to be back today, will call this afternoon, [phone], account # [id number]  Subjective / 24h Interval events: Awake this morning, alert, can answer basic questions but remains confused.  Assesement and Plan: Principal Problem:   Acute encephalopathy Active Problems:   Hyperlipidemia with target LDL less than 70   Essential hypertension   Sinus node dysfunction (HCC)   Insulin -requiring or dependent type II diabetes mellitus (HCC)   Paroxysmal atrial fibrillation (HCC)   Elevated troponin   Prolonged QT interval   Palliative care by specialist  Principal problem Acute Metabolic Encephalopathy - MRI Negative for acute stroke, EEG no acute seizures.  LP- for  meningitis. -Completed 5 days of IVIG on 6//2025 not significant improvement noted. -Ammonia 22, ACTH  test negative, Thiamine  107, HIV non reactive, RPR non reactive, ANA negative, VDRL CSF negative, no oligoclonal band seen on CSF, meningitis encephalitis panel negative, CSF culture negative. Blood culture negative, TSH: 1.0 - Repeated MRI 6/3 with contrast was negative for acute finding, mention chronic small vessel disease, moderate generalized atrophy and white matter disease bilaterally.  Chronic CVA. - B12 low normal (270) getting intramuscular injection .  - Still in the differential of rapid neurocognitive decline from frontotemporal dementia versus global cognitive decline.   -Auto-immune panel Serum - Autoimmune  encephalopathy antibody negative,  Anti-Hu antibody negative, antineuronal antibody negative,  anti-Yo antibody negative,Amphiphysin Antibody Negative, mGluR1 Antibody Negative, Ma2/Ta Antibody Negative, GABA-B-R Antibody    Negative.    -CSF: Autoimmune panel sent to [facility] per [facility], results are still pending today -I sent referral to out patient movement disorder at [facility].  -MS stable, fluctuates.  -Check Nocturnal Pulse Oxymetry. He had 4 desaturations with Oxygen sat 72--80 from 14 to 104 seconds , place on Nocturnal oxygen    Active problems Suspected cognitive impairment, POA - Patient had some more forgetfulness with directions, cooking/prepping meals, finances, and not driving lawn mower well . Could be possible that cognitive impairment became acutely worse in setting of acute illness. Referral to [facility] made.   BL Ronchus - Flutter valve. Incentive spirometry. Duo-neb TID.   Acute kidney injury on chronic CKD 3 - Baseline Appears to be 1.5-1.6.  Creatinine peaked to 2.5, now close to baseline but varying day today  Fever -6/17, cultures were sent, started on linezolid  and Unasyn .  Possible aspiration, currently narrowed to  Augmentin  alone for  total of 5 days  Insulin -dependent diabetes type 2 -Last A1c 8.7% on 09/06/2023. Some intermittent hypoglycemia initially, still 1 episode last night but improved later on  Lab Results  Component Value Date   HGBA1C 8.7 (H) 09/06/2023   CBG (last 3)  Recent Labs    11/03/23 2149 11/03/23 2232 11/04/23 0604  GLUCAP 63* 133* 155*   Essential hypertension - Continue with amlodipine , blood pressure remains stable   Elevated troponin - Echo unremarkable, likely demand ischemia   Acute urinary retention -Foley discontinued 6/2, good urinary output.  Started on Flomax    Hyperlipidemia - not able to tolerate regular statin   Prolonged QT, Sinus node dysfunction (HCC) - s/p pacemaker   Rash chest left shoulder - on Bacitracin    Pain with Urination -UA no significant WBC, No nitrates. Urine culture. Insignificant growth. Renal US ; no hydronephrosis, circumferential of bladder cystitis vs Outlet obstruction. Discussed with Urology, bladder thickening might be from age as well. Renal cyst on US , follow out patient.    Scheduled Meds:  amLODipine   10 mg Oral Daily   amoxicillin -clavulanate  1 tablet Oral Q12H   apixaban   2.5 mg Oral BID   bacitracin    Topical BID   Chlorhexidine  Gluconate Cloth  6 each Topical Daily   vitamin B-12  1,000 mcg Oral Daily   feeding supplement (GLUCERNA SHAKE)  237 mL Oral Q24H   ferrous sulfate   325 mg Oral Q breakfast   fluticasone   1 spray Each Nare Daily   folic acid   1 mg Oral Daily   insulin  aspart  0-5 Units Subcutaneous QHS   insulin  aspart  0-9 Units Subcutaneous TID WC   insulin  aspart  3 Units Subcutaneous TID WC   insulin  glargine-yfgn  14 Units Subcutaneous Daily   metoprolol  tartrate  12.5 mg Oral BID   mupirocin  ointment  1 Application Nasal BID   sodium bicarbonate   1,300 mg Oral BID   tamsulosin   0.4 mg Oral Daily   Continuous Infusions:   PRN Meds:.acetaminophen  **OR** acetaminophen  (TYLENOL ) oral liquid 160 mg/5 mL **OR**  acetaminophen , artificial tears, hydrALAZINE , ipratropium-albuterol , mouth rinse  Current Outpatient Medications  Medication Instructions   allopurinol  (ZYLOPRIM ) 100 MG tablet TAKE 1 TABLET(100 MG) BY MOUTH DAILY   amoxicillin  (AMOXIL ) 500 MG capsule TAKE ONE CAPSULE BY MOUTH THREE TIMES DAILY. TAKE ONLY WHEN HAVING DENTAL PROCEDURES   apixaban  (ELIQUIS ) 2.5 mg, Oral, 2 times daily   calcitRIOL  (ROCALTROL ) 0.25 MCG capsule TAKE 1 CAPSULE BY MOUTH DAILY RETURN IN ABOUT 6 MONTHS(AROUND 02/04/2023)   carvedilol  (COREG ) 6.25 MG tablet TAKE 1 TABLET(6.25 MG) BY MOUTH TWICE DAILY WITH A MEAL   Continuous Glucose Sensor (DEXCOM G7 SENSOR) MISC 1 Act, Does not apply, Daily   furosemide  (LASIX ) 40 MG tablet Take 2 tablets in the morning and 1 tablet in the afternoon.   Glucagon  (GVOKE HYPOPEN  2-PACK) 1 MG/0.2ML SOAJ 1 Act, Subcutaneous, Daily PRN   insulin  isophane & regular human KwikPen (HUMULIN  70/30 KWIKPEN) (70-30) 100 UNIT/ML KwikPen 40 units in AM and 25 units in PM   levocetirizine (XYZAL ) 5 mg, Oral, Every evening   loratadine (CLARITIN) 10 mg, Oral, Daily PRN   losartan  (COZAAR ) 25 mg, Oral, Daily   pravastatin  (PRAVACHOL ) 40 mg, Oral, Daily, TAKE 1 TABLET(40 MG) BY MOUTH DAILY    Diet Orders (From admission, onward)     Start     Ordered   10/29/23 1708  DIET - DYS 1 Room service appropriate?  No; Fluid consistency: Thin  Diet effective now       Comments: Carb Modified.  Question Answer Comment  Room service appropriate? No   Fluid consistency: Thin      10/29/23 1708            DVT prophylaxis: apixaban  (ELIQUIS ) tablet 2.5 mg Start: 10/15/23 1430 SCDs Start: 10/15/23 1335 SCD's Start: 10/10/23 2338 apixaban  (ELIQUIS ) tablet 2.5 mg   Lab Results  Component Value Date   PLT 261 11/04/2023      Code Status: Limited: Do not attempt resuscitation (DNR) -DNR-LIMITED -Do Not Intubate/DNI   Family Communication: no family at bedside, updated son 6/19  Status is:  Inpatient Remains inpatient appropriate because: severity of illness  Level of care: Progressive  Consultants:  Neurology   Objective: Vitals:   11/03/23 1734 11/04/23 0020 11/04/23 0419 11/04/23 0748  BP: (!) 116/54 134/76 132/67 126/70  Pulse: 87 88 87 88  Resp:  17 18 16   Temp:  98.6 F (37 C) 98.7 F (37.1 C) 98.4 F (36.9 C)  TempSrc:  Oral Oral Oral  SpO2: 98% 94% 93% 97%  Weight:      Height:        Intake/Output Summary (Last 24 hours) at 11/04/2023 1022 Last data filed at 11/04/2023 0620 Gross per 24 hour  Intake --  Output 950 ml  Net -950 ml   Wt Readings from Last 3 Encounters:  10/10/23 91 kg  09/06/23 90.4 kg  05/08/23 92.1 kg    Examination:  Constitutional: NAD Eyes: lids and conjunctivae normal, no scleral icterus ENMT: mmm Neck: normal, supple Respiratory: clear to auscultation bilaterally, no wheezing, no crackles. Normal respiratory effort.  Cardiovascular: Regular rate and rhythm, no murmurs / rubs / gallops. No LE edema. Abdomen: soft, no distention, no tenderness. Bowel sounds positive.    Data Reviewed: I have independently reviewed following labs and imaging studies   CBC Recent Labs  Lab 10/29/23 0700 10/30/23 0439 10/31/23 0832 11/02/23 0429 11/04/23 0703  WBC 3.6* 3.5* 3.2* 3.8* 2.9*  HGB 11.3* 11.5* 12.0* 11.2* 11.9*  HCT 34.2* 33.9* 35.8* 32.9* 36.0*  PLT 171 182 189 227 261  MCV 90.2 91.1 90.9 89.4 90.9  MCH 29.8 30.9 30.5 30.4 30.1  MCHC 33.0 33.9 33.5 34.0 33.1  RDW 14.7 14.6 14.7 14.6 14.8  LYMPHSABS  --  0.9  --  1.1  --   MONOABS  --  0.4  --  0.7  --   EOSABS  --  0.2  --  0.4  --   BASOSABS  --  0.0  --  0.0  --     Recent Labs  Lab 10/30/23 0439 10/31/23 0832 11/01/23 0456 11/02/23 0429 11/04/23 0703  NA 134* 134* 134* 134* 139  K 4.2 4.3 4.0 3.7 3.8  CL 106 107 105 104 107  CO2 19* 17* 20* 22 25  GLUCOSE 169* 241* 206* 95 174*  BUN 28* 26* 23 23 21   CREATININE 1.74* 1.70* 1.63* 1.81* 1.68*   CALCIUM 8.4* 8.5* 8.0* 8.2* 8.2*  AST  --   --   --  36 50*  ALT  --   --   --  25 33  ALKPHOS  --   --   --  56 57  BILITOT  --   --   --  0.6 0.5  ALBUMIN  --   --   --  2.0* 1.9*  MG  --   --   --  1.8 1.9    ------------------------------------------------------------------------------------------------------------------ No results for input(s): CHOL, HDL, LDLCALC, TRIG, CHOLHDL, LDLDIRECT in the last 72 hours.  Lab Results  Component Value Date   HGBA1C 8.7 (H) 09/06/2023   ------------------------------------------------------------------------------------------------------------------ No results for input(s): TSH, T4TOTAL, T3FREE, THYROIDAB in the last 72 hours.  Invalid input(s): FREET3  Cardiac Enzymes No results for input(s): CKMB, TROPONINI, MYOGLOBIN in the last 168 hours.  Invalid input(s): CK ------------------------------------------------------------------------------------------------------------------ No results found for: BNP  CBG: Recent Labs  Lab 11/03/23 1153 11/03/23 1611 11/03/23 2149 11/03/23 2232 11/04/23 0604  GLUCAP 303* 208* 63* 133* 155*    Recent Results (from the past 240 hours)  Urine Culture (for pregnant, neutropenic or urologic patients or patients with an indwelling urinary catheter)     Status: Abnormal   Collection Time: 10/28/23  7:04 AM   Specimen: Urine, Clean Catch  Result Value Ref Range Status   Specimen Description URINE, CLEAN CATCH  Final   Special Requests NONE  Final   Culture (A)  Final    <10,000 COLONIES/mL INSIGNIFICANT GROWTH Performed at [facility], [address] [place], [place] [address]    Report Status 10/29/2023 FINAL  Final  MRSA Next Gen by PCR, Nasal     Status: Abnormal   Collection Time: 10/31/23  7:45 AM   Specimen: Nasal Mucosa; Nasal Swab  Result Value Ref Range Status   MRSA by PCR Next Gen DETECTED (A) NOT DETECTED Final    Comment: RESULT CALLED  TO, READ BACK BY AND VERIFIED WITH: RN MAIRE MARUS [id number] AT 940 AM BY CM (NOTE) The GeneXpert MRSA Assay (FDA approved for NASAL specimens only), is one component of a comprehensive MRSA colonization surveillance program. It is not intended to diagnose MRSA infection nor to guide or monitor treatment for MRSA infections. Test performance is not FDA approved in patients less than 49 years old. Performed at [facility], [address], [place], [place] [address]   Culture, blood (Routine X 2) w Reflex to ID Panel     Status: None (Preliminary result)   Collection Time: 10/31/23  8:32 AM   Specimen: BLOOD LEFT ARM  Result Value Ref Range Status   Specimen Description BLOOD LEFT ARM  Final   Special Requests   Final    BOTTLES DRAWN AEROBIC ONLY Blood Culture results may not be optimal due to an inadequate volume of blood received in culture bottles   Culture   Final    NO GROWTH 4 DAYS Performed at [facility], [address], [place], [place] [address]    Report Status PENDING  Incomplete  Culture, blood (Routine X 2) w Reflex to ID Panel     Status: None (Preliminary result)   Collection Time: 10/31/23  8:33 AM   Specimen: BLOOD LEFT HAND  Result Value Ref Range Status   Specimen Description BLOOD LEFT HAND  Final   Special Requests   Final    BOTTLES DRAWN AEROBIC ONLY Blood Culture results may not be optimal due to an inadequate volume of blood received in culture bottles   Culture   Final    NO GROWTH 4 DAYS Performed at [facility], [address] [place], [place] [address]    Report Status PENDING  Incomplete     Radiology Studies: No results found.   Nilda Fendt, MD, PhD [facility]  Between 7 am - 7 pm I am available, please contact me via Amion (for  emergencies) or Securechat (non urgent messages)  Between 7 pm - 7 am I am not available, please contact night coverage MD/APP via Amion

## 2023-11-04 NOTE — Plan of Care (Signed)
   Problem: Education: Goal: Ability to describe self-care measures that may prevent or decrease complications (Diabetes Survival Skills Education) will improve Outcome: Progressing Goal: Individualized Educational Video(s) Outcome: Progressing

## 2023-11-05 DIAGNOSIS — G9340 Encephalopathy, unspecified: Secondary | ICD-10-CM | POA: Diagnosis not present

## 2023-11-05 LAB — GLUCOSE, CAPILLARY
Glucose-Capillary: 128 mg/dL — ABNORMAL HIGH (ref 70–99)
Glucose-Capillary: 176 mg/dL — ABNORMAL HIGH (ref 70–99)
Glucose-Capillary: 205 mg/dL — ABNORMAL HIGH (ref 70–99)
Glucose-Capillary: 130 mg/dL — ABNORMAL HIGH (ref 70–99)
Glucose-Capillary: 72 mg/dL (ref 70–99)

## 2023-11-05 LAB — CULTURE, BLOOD (ROUTINE X 2)
Culture: NO GROWTH
Culture: NO GROWTH

## 2023-11-05 NOTE — Plan of Care (Signed)
 Problem: Education: Goal: Ability to describe self-care measures that may prevent or decrease complications (Diabetes Survival Skills Education) will improve 11/05/2023 1643 by Bernardine Nelwyn DASEN, RN Outcome: Progressing 11/05/2023 1643 by Bernardine Nelwyn DASEN, RN Outcome: Progressing Goal: Individualized Educational Video(s) 11/05/2023 1643 by Bernardine Nelwyn DASEN, RN Outcome: Progressing 11/05/2023 1643 by Bernardine Nelwyn DASEN, RN Outcome: Progressing   Problem: Coping: Goal: Ability to adjust to condition or change in health will improve 11/05/2023 1643 by Bernardine Nelwyn DASEN, RN Outcome: Progressing 11/05/2023 1643 by Bernardine Nelwyn DASEN, RN Outcome: Progressing   Problem: Fluid Volume: Goal: Ability to maintain a balanced intake and output will improve 11/05/2023 1643 by Bernardine Nelwyn DASEN, RN Outcome: Progressing 11/05/2023 1643 by Bernardine Nelwyn DASEN, RN Outcome: Progressing   Problem: Health Behavior/Discharge Planning: Goal: Ability to identify and utilize available resources and services will improve 11/05/2023 1643 by Bernardine Nelwyn DASEN, RN Outcome: Progressing 11/05/2023 1643 by Bernardine Nelwyn DASEN, RN Outcome: Progressing Goal: Ability to manage health-related needs will improve 11/05/2023 1643 by Bernardine Nelwyn DASEN, RN Outcome: Progressing 11/05/2023 1643 by Bernardine Nelwyn DASEN, RN Outcome: Progressing   Problem: Metabolic: Goal: Ability to maintain appropriate glucose levels will improve 11/05/2023 1643 by Bernardine Nelwyn DASEN, RN Outcome: Progressing 11/05/2023 1643 by Bernardine Nelwyn DASEN, RN Outcome: Progressing   Problem: Nutritional: Goal: Maintenance of adequate nutrition will improve 11/05/2023 1643 by Bernardine Nelwyn DASEN, RN Outcome: Progressing 11/05/2023 1643 by Bernardine Nelwyn DASEN, RN Outcome: Progressing Goal: Progress toward achieving an optimal weight will improve 11/05/2023 1643 by Bernardine Nelwyn DASEN, RN Outcome: Progressing 11/05/2023 1643 by Bernardine Nelwyn DASEN, RN Outcome:  Progressing   Problem: Skin Integrity: Goal: Risk for impaired skin integrity will decrease 11/05/2023 1643 by Bernardine Nelwyn DASEN, RN Outcome: Progressing 11/05/2023 1643 by Bernardine Nelwyn DASEN, RN Outcome: Progressing   Problem: Tissue Perfusion: Goal: Adequacy of tissue perfusion will improve 11/05/2023 1643 by Bernardine Nelwyn DASEN, RN Outcome: Progressing 11/05/2023 1643 by Bernardine Nelwyn DASEN, RN Outcome: Progressing   Problem: Coping: Goal: Will verbalize positive feelings about self 11/05/2023 1643 by Bernardine Nelwyn DASEN, RN Outcome: Progressing 11/05/2023 1643 by Bernardine Nelwyn DASEN, RN Outcome: Progressing Goal: Will identify appropriate support needs 11/05/2023 1643 by Bernardine Nelwyn DASEN, RN Outcome: Progressing 11/05/2023 1643 by Bernardine Nelwyn DASEN, RN Outcome: Progressing   Problem: Health Behavior/Discharge Planning: Goal: Ability to manage health-related needs will improve 11/05/2023 1643 by Bernardine Nelwyn DASEN, RN Outcome: Progressing 11/05/2023 1643 by Bernardine Nelwyn DASEN, RN Outcome: Progressing Goal: Goals will be collaboratively established with patient/family 11/05/2023 1643 by Bernardine Nelwyn DASEN, RN Outcome: Progressing 11/05/2023 1643 by Bernardine Nelwyn DASEN, RN Outcome: Progressing   Problem: Self-Care: Goal: Ability to participate in self-care as condition permits will improve 11/05/2023 1643 by Bernardine Nelwyn DASEN, RN Outcome: Progressing 11/05/2023 1643 by Bernardine Nelwyn DASEN, RN Outcome: Progressing Goal: Verbalization of feelings and concerns over difficulty with self-care will improve 11/05/2023 1643 by Bernardine Nelwyn DASEN, RN Outcome: Progressing 11/05/2023 1643 by Bernardine Nelwyn DASEN, RN Outcome: Progressing Goal: Ability to communicate needs accurately will improve 11/05/2023 1643 by Bernardine Nelwyn DASEN, RN Outcome: Progressing 11/05/2023 1643 by Bernardine Nelwyn DASEN, RN Outcome: Progressing   Problem: Nutrition: Goal: Risk of aspiration will decrease 11/05/2023 1643 by Bernardine Nelwyn DASEN, RN Outcome: Progressing 11/05/2023 1643 by Bernardine Nelwyn DASEN, RN Outcome: Progressing Goal: Dietary intake will improve 11/05/2023 1643 by Bernardine Nelwyn DASEN, RN Outcome: Progressing 11/05/2023 1643 by Bernardine Nelwyn DASEN, RN Outcome: Progressing   Problem: Education: Goal: Knowledge of General Education  information will improve Description: Including pain rating scale, medication(s)/side effects and non-pharmacologic comfort measures 11/05/2023 1643 by Bernardine Nelwyn DASEN, RN Outcome: Progressing 11/05/2023 1643 by Bernardine Nelwyn DASEN, RN Outcome: Progressing   Problem: Health Behavior/Discharge Planning: Goal: Ability to manage health-related needs will improve 11/05/2023 1643 by Bernardine Nelwyn DASEN, RN Outcome: Progressing 11/05/2023 1643 by Bernardine Nelwyn DASEN, RN Outcome: Progressing   Problem: Clinical Measurements: Goal: Ability to maintain clinical measurements within normal limits will improve 11/05/2023 1643 by Bernardine Nelwyn DASEN, RN Outcome: Progressing 11/05/2023 1643 by Bernardine Nelwyn DASEN, RN Outcome: Progressing Goal: Will remain free from infection 11/05/2023 1643 by Bernardine Nelwyn DASEN, RN Outcome: Progressing 11/05/2023 1643 by Bernardine Nelwyn DASEN, RN Outcome: Progressing Goal: Diagnostic test results will improve 11/05/2023 1643 by Bernardine Nelwyn DASEN, RN Outcome: Progressing 11/05/2023 1643 by Bernardine Nelwyn DASEN, RN Outcome: Progressing Goal: Respiratory complications will improve 11/05/2023 1643 by Bernardine Nelwyn DASEN, RN Outcome: Progressing 11/05/2023 1643 by Bernardine Nelwyn DASEN, RN Outcome: Progressing Goal: Cardiovascular complication will be avoided 11/05/2023 1643 by Bernardine Nelwyn DASEN, RN Outcome: Progressing 11/05/2023 1643 by Bernardine Nelwyn DASEN, RN Outcome: Progressing   Problem: Activity: Goal: Risk for activity intolerance will decrease 11/05/2023 1643 by Bernardine Nelwyn DASEN, RN Outcome: Progressing 11/05/2023 1643 by Bernardine Nelwyn DASEN, RN Outcome: Progressing    Problem: Nutrition: Goal: Adequate nutrition will be maintained 11/05/2023 1643 by Bernardine Nelwyn DASEN, RN Outcome: Progressing 11/05/2023 1643 by Bernardine Nelwyn DASEN, RN Outcome: Progressing   Problem: Coping: Goal: Level of anxiety will decrease 11/05/2023 1643 by Bernardine Nelwyn DASEN, RN Outcome: Progressing 11/05/2023 1643 by Bernardine Nelwyn DASEN, RN Outcome: Progressing   Problem: Elimination: Goal: Will not experience complications related to bowel motility 11/05/2023 1643 by Bernardine Nelwyn DASEN, RN Outcome: Progressing 11/05/2023 1643 by Bernardine Nelwyn DASEN, RN Outcome: Progressing Goal: Will not experience complications related to urinary retention 11/05/2023 1643 by Bernardine Nelwyn DASEN, RN Outcome: Progressing 11/05/2023 1643 by Bernardine Nelwyn DASEN, RN Outcome: Progressing   Problem: Pain Managment: Goal: General experience of comfort will improve and/or be controlled 11/05/2023 1643 by Bernardine Nelwyn DASEN, RN Outcome: Progressing 11/05/2023 1643 by Bernardine Nelwyn DASEN, RN Outcome: Progressing   Problem: Safety: Goal: Ability to remain free from injury will improve 11/05/2023 1643 by Bernardine Nelwyn DASEN, RN Outcome: Progressing 11/05/2023 1643 by Bernardine Nelwyn DASEN, RN Outcome: Progressing   Problem: Skin Integrity: Goal: Risk for impaired skin integrity will decrease 11/05/2023 1643 by Bernardine Nelwyn DASEN, RN Outcome: Progressing 11/05/2023 1643 by Bernardine Nelwyn DASEN, RN Outcome: Progressing

## 2023-11-05 NOTE — Progress Notes (Signed)
 PROGRESS NOTE  Connor Dixon FMW:[id number] DOB: 03/05/1935 DOA: 10/10/2023 PCP: [name], MD   LOS: 26 days   Brief Narrative / Interim history: 89 year old with past medical history significant for chronic diastolic heart failure, complete heart block status post pacemaker, CKD 3, diabetes, hypertension, hyperlipidemia who was admitted after being found slumped over in his chair at home and unresponsive.  Last known well was the evening prior to admission on 10/09/2023.  Baseline he is considered high functioning and ambulates independently and lives alone. Evaluation consistent with CT head no acute process.  CTA head and neck demonstrated no large vessel occlusion.  There was evidence of a remote lacunar infarct right corona radiata.  MRI was negative for acute intracranial abnormality.  Evidence for chronic small vessels disease.  EEG unremarkable.  LP performed 10/13/2023 has  ruled out meningitis.  Antibiotics for meningitis were discontinued by neurology.  Patient received a steroid to cover Autoimmune component and subsequently discontinued 10/14/2023. He also completed IV IG for Autoimmune encephalopathy. Serum auto - immune panel performed at [facility] was negative. CSF Autoimmune panel sent to [facility] clinic supposed to be back today, will call this afternoon, [phone], account # [id number]  Subjective / 24h Interval events: Awake, alert, remains confused.  Assesement and Plan: Principal Problem:   Acute encephalopathy Active Problems:   Hyperlipidemia with target LDL less than 70   Essential hypertension   Sinus node dysfunction (HCC)   Insulin -requiring or dependent type II diabetes mellitus (HCC)   Paroxysmal atrial fibrillation (HCC)   Elevated troponin   Prolonged QT interval   Palliative care by specialist  Principal problem Acute Metabolic Encephalopathy - MRI Negative for acute stroke, EEG no acute seizures.  LP- for meningitis. -Completed 5 days of IVIG on 6//2025  not significant improvement noted. -Ammonia 22, ACTH  test negative, Thiamine  107, HIV non reactive, RPR non reactive, ANA negative, VDRL CSF negative, no oligoclonal band seen on CSF, meningitis encephalitis panel negative, CSF culture negative. Blood culture negative, TSH: 1.0 - Repeated MRI 6/3 with contrast was negative for acute finding, mention chronic small vessel disease, moderate generalized atrophy and white matter disease bilaterally.  Chronic CVA. - B12 low normal (270) getting intramuscular injection .  - Still in the differential of rapid neurocognitive decline from frontotemporal dementia versus global cognitive decline.   -Auto-immune panel Serum - Autoimmune  encephalopathy antibody negative,  Anti-Hu antibody negative, antineuronal antibody negative,  anti-Yo antibody negative,Amphiphysin Antibody Negative, mGluR1 Antibody Negative, Ma2/Ta Antibody Negative, GABA-B-R Antibody    Negative.    -CSF: Autoimmune panel sent to [facility] per [facility], results are still pending today -I sent referral to out patient movement disorder at [facility].  -MS stable, fluctuates.  -Check Nocturnal Pulse Oxymetry. He had 4 desaturations with Oxygen sat 72--80 from 14 to 104 seconds , place on Nocturnal oxygen    Active problems Suspected cognitive impairment, POA - Patient had some more forgetfulness with directions, cooking/prepping meals, finances, and not driving lawn mower well . Could be possible that cognitive impairment became acutely worse in setting of acute illness. Referral to [facility] made.   BL Ronchus - Flutter valve. Incentive spirometry. Duo-neb TID.   Acute kidney injury on chronic CKD 3 - Baseline Appears to be 1.5-1.6.  Creatinine peaked to 2.5, now close to baseline at 1.68  Fever -6/17, cultures were sent, started on linezolid  and Unasyn .  Possible aspiration, currently narrowed to Augmentin  alone for total of 5 days.  White  count is 2.9, he remains  afebrile  Insulin -dependent diabetes type 2 -Last A1c 8.7% on 09/06/2023. Some intermittent hypoglycemia initially, still 1 episode last night but improved later on  Lab Results  Component Value Date   HGBA1C 8.7 (H) 09/06/2023   CBG (last 3)  Recent Labs    11/04/23 1624 11/04/23 2106 11/05/23 0614  GLUCAP 271* 102* 128*   Essential hypertension - Continue with amlodipine , blood pressure remains stable   Elevated troponin - Echo unremarkable, likely demand ischemia   Acute urinary retention -Foley discontinued 6/2, good urinary output.  Started on Flomax    Hyperlipidemia - not able to tolerate regular statin   Prolonged QT, Sinus node dysfunction (HCC) - s/p pacemaker   Rash chest left shoulder - on Bacitracin    Pain with Urination -UA no significant WBC, No nitrates. Urine culture. Insignificant growth. Renal US ; no hydronephrosis, circumferential of bladder cystitis vs Outlet obstruction. Discussed with Urology, bladder thickening might be from age as well. Renal cyst on US , follow out patient.    Scheduled Meds:  amLODipine   10 mg Oral Daily   apixaban   2.5 mg Oral BID   bacitracin    Topical BID   Chlorhexidine  Gluconate Cloth  6 each Topical Daily   vitamin B-12  1,000 mcg Oral Daily   feeding supplement (GLUCERNA SHAKE)  237 mL Oral Q24H   ferrous sulfate   325 mg Oral Q breakfast   fluticasone   1 spray Each Nare Daily   folic acid   1 mg Oral Daily   insulin  aspart  0-5 Units Subcutaneous QHS   insulin  aspart  0-9 Units Subcutaneous TID WC   insulin  aspart  3 Units Subcutaneous TID WC   insulin  glargine-yfgn  14 Units Subcutaneous Daily   metoprolol  tartrate  12.5 mg Oral BID   mupirocin  ointment  1 Application Nasal BID   sodium bicarbonate   1,300 mg Oral BID   tamsulosin   0.4 mg Oral Daily   Continuous Infusions:   PRN Meds:.acetaminophen  **OR** acetaminophen  (TYLENOL ) oral liquid 160 mg/5 mL **OR** acetaminophen , artificial tears, hydrALAZINE ,  ipratropium-albuterol , mouth rinse  Current Outpatient Medications  Medication Instructions   allopurinol  (ZYLOPRIM ) 100 MG tablet TAKE 1 TABLET(100 MG) BY MOUTH DAILY   amoxicillin  (AMOXIL ) 500 MG capsule TAKE ONE CAPSULE BY MOUTH THREE TIMES DAILY. TAKE ONLY WHEN HAVING DENTAL PROCEDURES   apixaban  (ELIQUIS ) 2.5 mg, Oral, 2 times daily   calcitRIOL  (ROCALTROL ) 0.25 MCG capsule TAKE 1 CAPSULE BY MOUTH DAILY RETURN IN ABOUT 6 MONTHS(AROUND 02/04/2023)   carvedilol  (COREG ) 6.25 MG tablet TAKE 1 TABLET(6.25 MG) BY MOUTH TWICE DAILY WITH A MEAL   Continuous Glucose Sensor (DEXCOM G7 SENSOR) MISC 1 Act, Does not apply, Daily   furosemide  (LASIX ) 40 MG tablet Take 2 tablets in the morning and 1 tablet in the afternoon.   Glucagon  (GVOKE HYPOPEN  2-PACK) 1 MG/0.2ML SOAJ 1 Act, Subcutaneous, Daily PRN   insulin  isophane & regular human KwikPen (HUMULIN  70/30 KWIKPEN) (70-30) 100 UNIT/ML KwikPen 40 units in AM and 25 units in PM   levocetirizine (XYZAL ) 5 mg, Oral, Every evening   loratadine (CLARITIN) 10 mg, Oral, Daily PRN   losartan  (COZAAR ) 25 mg, Oral, Daily   pravastatin  (PRAVACHOL ) 40 mg, Oral, Daily, TAKE 1 TABLET(40 MG) BY MOUTH DAILY    Diet Orders (From admission, onward)     Start     Ordered   10/29/23 1708  DIET - DYS 1 Room service appropriate? No; Fluid consistency: Thin  Diet effective now  Comments: Carb Modified.  Question Answer Comment  Room service appropriate? No   Fluid consistency: Thin      10/29/23 1708            DVT prophylaxis: apixaban  (ELIQUIS ) tablet 2.5 mg Start: 10/15/23 1430 SCDs Start: 10/15/23 1335 SCD's Start: 10/10/23 2338 apixaban  (ELIQUIS ) tablet 2.5 mg   Lab Results  Component Value Date   PLT 261 11/04/2023      Code Status: Limited: Do not attempt resuscitation (DNR) -DNR-LIMITED -Do Not Intubate/DNI   Family Communication: Discussed with son  Status is: Inpatient Remains inpatient appropriate because: severity of  illness  Level of care: Progressive  Consultants:  Neurology   Objective: Vitals:   11/04/23 2108 11/04/23 2330 11/05/23 0333 11/05/23 0720  BP: 130/66 137/87 108/85 133/78  Pulse: 98 92 92 92  Resp: 18 20 17 18   Temp: 98.3 F (36.8 C) 98.5 F (36.9 C) 98.6 F (37 C) 98.3 F (36.8 C)  TempSrc: Oral   Oral  SpO2: 96% 96% 95% 96%  Weight:      Height:        Intake/Output Summary (Last 24 hours) at 11/05/2023 1035 Last data filed at 11/04/2023 1101 Gross per 24 hour  Intake 118 ml  Output --  Net 118 ml   Wt Readings from Last 3 Encounters:  10/10/23 91 kg  09/06/23 90.4 kg  05/08/23 92.1 kg    Examination:  Constitutional: NAD Eyes: lids and conjunctivae normal, no scleral icterus ENMT: mmm Neck: normal, supple Respiratory: clear to auscultation bilaterally, no wheezing, no crackles.  Cardiovascular: Regular rate and rhythm, no murmurs / rubs / gallops. No LE edema. Abdomen: soft, no distention, no tenderness. Bowel sounds positive.    Data Reviewed: I have independently reviewed following labs and imaging studies   CBC Recent Labs  Lab 10/30/23 0439 10/31/23 0832 11/02/23 0429 11/04/23 0703  WBC 3.5* 3.2* 3.8* 2.9*  HGB 11.5* 12.0* 11.2* 11.9*  HCT 33.9* 35.8* 32.9* 36.0*  PLT 182 189 227 261  MCV 91.1 90.9 89.4 90.9  MCH 30.9 30.5 30.4 30.1  MCHC 33.9 33.5 34.0 33.1  RDW 14.6 14.7 14.6 14.8  LYMPHSABS 0.9  --  1.1  --   MONOABS 0.4  --  0.7  --   EOSABS 0.2  --  0.4  --   BASOSABS 0.0  --  0.0  --     Recent Labs  Lab 10/30/23 0439 10/31/23 0832 11/01/23 0456 11/02/23 0429 11/04/23 0703  NA 134* 134* 134* 134* 139  K 4.2 4.3 4.0 3.7 3.8  CL 106 107 105 104 107  CO2 19* 17* 20* 22 25  GLUCOSE 169* 241* 206* 95 174*  BUN 28* 26* 23 23 21   CREATININE 1.74* 1.70* 1.63* 1.81* 1.68*  CALCIUM 8.4* 8.5* 8.0* 8.2* 8.2*  AST  --   --   --  36 50*  ALT  --   --   --  25 33  ALKPHOS  --   --   --  56 57  BILITOT  --   --   --  0.6 0.5   ALBUMIN  --   --   --  2.0* 1.9*  MG  --   --   --  1.8 1.9    ------------------------------------------------------------------------------------------------------------------ No results for input(s): CHOL, HDL, LDLCALC, TRIG, CHOLHDL, LDLDIRECT in the last 72 hours.  Lab Results  Component Value Date   HGBA1C 8.7 (H) 09/06/2023   ------------------------------------------------------------------------------------------------------------------ No  results for input(s): TSH, T4TOTAL, T3FREE, THYROIDAB in the last 72 hours.  Invalid input(s): FREET3  Cardiac Enzymes No results for input(s): CKMB, TROPONINI, MYOGLOBIN in the last 168 hours.  Invalid input(s): CK ------------------------------------------------------------------------------------------------------------------ No results found for: BNP  CBG: Recent Labs  Lab 11/04/23 0604 11/04/23 1204 11/04/23 1624 11/04/23 2106 11/05/23 0614  GLUCAP 155* 123* 271* 102* 128*    Recent Results (from the past 240 hours)  Urine Culture (for pregnant, neutropenic or urologic patients or patients with an indwelling urinary catheter)     Status: Abnormal   Collection Time: 10/28/23  7:04 AM   Specimen: Urine, Clean Catch  Result Value Ref Range Status   Specimen Description URINE, CLEAN CATCH  Final   Special Requests NONE  Final   Culture (A)  Final    <10,000 COLONIES/mL INSIGNIFICANT GROWTH Performed at [facility], [address], [place], [place] [address]    Report Status 10/29/2023 FINAL  Final  MRSA Next Gen by PCR, Nasal     Status: Abnormal   Collection Time: 10/31/23  7:45 AM   Specimen: Nasal Mucosa; Nasal Swab  Result Value Ref Range Status   MRSA by PCR Next Gen DETECTED (A) NOT DETECTED Final    Comment: RESULT CALLED TO, READ BACK BY AND VERIFIED WITH: RN MAIRE MARUS [id number] AT 940 AM BY CM (NOTE) The GeneXpert MRSA Assay (FDA approved for NASAL specimens  only), is one component of a comprehensive MRSA colonization surveillance program. It is not intended to diagnose MRSA infection nor to guide or monitor treatment for MRSA infections. Test performance is not FDA approved in patients less than 59 years old. Performed at [facility], [address], [place], [place] [address]   Culture, blood (Routine X 2) w Reflex to ID Panel     Status: None   Collection Time: 10/31/23  8:32 AM   Specimen: BLOOD LEFT ARM  Result Value Ref Range Status   Specimen Description BLOOD LEFT ARM  Final   Special Requests   Final    BOTTLES DRAWN AEROBIC ONLY Blood Culture results may not be optimal due to an inadequate volume of blood received in culture bottles   Culture   Final    NO GROWTH 5 DAYS Performed at [facility], [address], [place], [place] [address]    Report Status 11/05/2023 FINAL  Final  Culture, blood (Routine X 2) w Reflex to ID Panel     Status: None   Collection Time: 10/31/23  8:33 AM   Specimen: BLOOD LEFT HAND  Result Value Ref Range Status   Specimen Description BLOOD LEFT HAND  Final   Special Requests   Final    BOTTLES DRAWN AEROBIC ONLY Blood Culture results may not be optimal due to an inadequate volume of blood received in culture bottles   Culture   Final    NO GROWTH 5 DAYS Performed at [facility], [address], [place], [place] [address]    Report Status 11/05/2023 FINAL  Final     Radiology Studies: No results found.   Nilda Fendt, MD, PhD [facility]  Between 7 am - 7 pm I am available, please contact me via Amion (for emergencies) or Securechat (non urgent messages)  Between 7 pm - 7 am I am not available, please contact night coverage MD/APP via Amion

## 2023-11-05 NOTE — Plan of Care (Signed)
  Problem: Education: Goal: Ability to describe self-care measures that may prevent or decrease complications (Diabetes Survival Skills Education) will improve Outcome: Progressing Goal: Individualized Educational Video(s) Outcome: Progressing   Problem: Coping: Goal: Ability to adjust to condition or change in health will improve Outcome: Progressing   Problem: Fluid Volume: Goal: Ability to maintain a balanced intake and output will improve Outcome: Progressing   Problem: Health Behavior/Discharge Planning: Goal: Ability to identify and utilize available resources and services will improve Outcome: Progressing Goal: Ability to manage health-related needs will improve Outcome: Progressing   Problem: Metabolic: Goal: Ability to maintain appropriate glucose levels will improve Outcome: Progressing   Problem: Nutritional: Goal: Maintenance of adequate nutrition will improve Outcome: Progressing Goal: Progress toward achieving an optimal weight will improve Outcome: Progressing   Problem: Skin Integrity: Goal: Risk for impaired skin integrity will decrease Outcome: Progressing   Problem: Tissue Perfusion: Goal: Adequacy of tissue perfusion will improve Outcome: Progressing   Problem: Coping: Goal: Will verbalize positive feelings about self Outcome: Progressing Goal: Will identify appropriate support needs Outcome: Progressing   Problem: Health Behavior/Discharge Planning: Goal: Ability to manage health-related needs will improve Outcome: Progressing Goal: Goals will be collaboratively established with patient/family Outcome: Progressing   Problem: Self-Care: Goal: Ability to participate in self-care as condition permits will improve Outcome: Progressing Goal: Verbalization of feelings and concerns over difficulty with self-care will improve Outcome: Progressing Goal: Ability to communicate needs accurately will improve Outcome: Progressing   Problem:  Nutrition: Goal: Risk of aspiration will decrease Outcome: Progressing Goal: Dietary intake will improve Outcome: Progressing   Problem: Education: Goal: Knowledge of General Education information will improve Description: Including pain rating scale, medication(s)/side effects and non-pharmacologic comfort measures Outcome: Progressing   Problem: Health Behavior/Discharge Planning: Goal: Ability to manage health-related needs will improve Outcome: Progressing   Problem: Clinical Measurements: Goal: Ability to maintain clinical measurements within normal limits will improve Outcome: Progressing Goal: Will remain free from infection Outcome: Progressing Goal: Diagnostic test results will improve Outcome: Progressing Goal: Respiratory complications will improve Outcome: Progressing Goal: Cardiovascular complication will be avoided Outcome: Progressing   Problem: Activity: Goal: Risk for activity intolerance will decrease Outcome: Progressing   Problem: Nutrition: Goal: Adequate nutrition will be maintained Outcome: Progressing   Problem: Coping: Goal: Level of anxiety will decrease Outcome: Progressing   Problem: Elimination: Goal: Will not experience complications related to bowel motility Outcome: Progressing Goal: Will not experience complications related to urinary retention Outcome: Progressing   Problem: Pain Managment: Goal: General experience of comfort will improve and/or be controlled Outcome: Progressing   Problem: Safety: Goal: Ability to remain free from injury will improve Outcome: Progressing   Problem: Skin Integrity: Goal: Risk for impaired skin integrity will decrease Outcome: Progressing

## 2023-11-06 DIAGNOSIS — G9340 Encephalopathy, unspecified: Secondary | ICD-10-CM | POA: Diagnosis not present

## 2023-11-06 LAB — CBC WITH DIFFERENTIAL/PLATELET
Abs Immature Granulocytes: 0.01 10*3/uL (ref 0.00–0.07)
Basophils Absolute: 0 10*3/uL (ref 0.0–0.1)
Basophils Relative: 1 %
Eosinophils Absolute: 0.3 10*3/uL (ref 0.0–0.5)
Eosinophils Relative: 9 %
HCT: 32.8 % — ABNORMAL LOW (ref 39.0–52.0)
Hemoglobin: 10.9 g/dL — ABNORMAL LOW (ref 13.0–17.0)
Immature Granulocytes: 0 %
Lymphocytes Relative: 38 %
Lymphs Abs: 1.3 10*3/uL (ref 0.7–4.0)
MCH: 30.4 pg (ref 26.0–34.0)
MCHC: 33.2 g/dL (ref 30.0–36.0)
MCV: 91.4 fL (ref 80.0–100.0)
Monocytes Absolute: 0.6 10*3/uL (ref 0.1–1.0)
Monocytes Relative: 19 %
Neutro Abs: 1.1 10*3/uL — ABNORMAL LOW (ref 1.7–7.7)
Neutrophils Relative %: 33 %
Platelets: 296 10*3/uL (ref 150–400)
RBC: 3.59 MIL/uL — ABNORMAL LOW (ref 4.22–5.81)
RDW: 14.9 % (ref 11.5–15.5)
WBC: 3.3 10*3/uL — ABNORMAL LOW (ref 4.0–10.5)
nRBC: 0 % (ref 0.0–0.2)

## 2023-11-06 LAB — COMPREHENSIVE METABOLIC PANEL WITH GFR
ALT: 27 U/L (ref 0–44)
AST: 39 U/L (ref 15–41)
Albumin: 1.8 g/dL — ABNORMAL LOW (ref 3.5–5.0)
Alkaline Phosphatase: 55 U/L (ref 38–126)
Anion gap: 5 (ref 5–15)
BUN: 20 mg/dL (ref 8–23)
CO2: 24 mmol/L (ref 22–32)
Calcium: 7.6 mg/dL — ABNORMAL LOW (ref 8.9–10.3)
Chloride: 109 mmol/L (ref 98–111)
Creatinine, Ser: 1.78 mg/dL — ABNORMAL HIGH (ref 0.61–1.24)
GFR, Estimated: 37 mL/min — ABNORMAL LOW (ref >60)
Glucose, Bld: 100 mg/dL — ABNORMAL HIGH (ref 70–99)
Potassium: 4 mmol/L (ref 3.5–5.1)
Sodium: 138 mmol/L (ref 135–145)
Total Bilirubin: 0.6 mg/dL (ref 0.0–1.2)
Total Protein: 5.5 g/dL — ABNORMAL LOW (ref 6.5–8.1)

## 2023-11-06 LAB — GLUCOSE, CAPILLARY
Glucose-Capillary: 120 mg/dL — ABNORMAL HIGH (ref 70–99)
Glucose-Capillary: 233 mg/dL — ABNORMAL HIGH (ref 70–99)
Glucose-Capillary: 136 mg/dL — ABNORMAL HIGH (ref 70–99)
Glucose-Capillary: 131 mg/dL — ABNORMAL HIGH (ref 70–99)

## 2023-11-06 LAB — MAGNESIUM: Magnesium: 2 mg/dL (ref 1.7–2.4)

## 2023-11-06 NOTE — Progress Notes (Signed)
 Physical Therapy Treatment Patient Details Name: Connor Dixon MRN: [id number] DOB: [birth year] Today's Date: 11/06/2023   History of Present Illness Pt is 89 year old presented to [facility] on  10/10/23 after found at home unresponsive. Pt with dense encephalopathy with flexor positioning of UE's. LP performed on 10/13/2023 has ruled out meningitis. CT and MRI on 5/29 were negative for acute intercranial abnormality.  PMH - afib on Eliquis , pacer, HTN, CKD, DM, obesity    PT Comments  Pt received in supine and appears more alert this session. Pt with good participation, however continues to be limited by impaired cognition and command following. Pt able to tolerate standing trials with a RW today and take a few steps to the recliner with up to mod A +2. Pt demonstrates quick fatigue and difficulty problem solving requiring increased cues and assist. Pt continues to benefit from PT services to progress toward functional mobility goals.    If plan is discharge home, recommend the following: Two people to help with walking and/or transfers;Assistance with cooking/housework;Supervision due to cognitive status;Assist for transportation;Help with stairs or ramp for entrance   Can travel by private vehicle     No  Equipment Recommendations  BSC/3in1;Wheelchair cushion (measurements PT);Wheelchair (measurements PT);Hoyer lift;Hospital bed    Recommendations for Other Services       Precautions / Restrictions Precautions Precautions: Fall Recall of Precautions/Restrictions: Impaired Restrictions Weight Bearing Restrictions Per Provider Order: No     Mobility  Bed Mobility Overal bed mobility: Needs Assistance Bed Mobility: Supine to Sit   Sidelying to sit: Used rails, Mod assist, Min assist       General bed mobility comments: assist for BLE advancement to EOB and initiation of trunk elevation, however when pt understood goal to sit EOB he progressed to min A for trunk elevation     Transfers Overall transfer level: Needs assistance Equipment used: Rolling walker (2 wheels) Transfers: Sit to/from Stand, Bed to chair/wheelchair/BSC Sit to Stand: +2 physical assistance, Mod assist, Min assist   Step pivot transfers: Mod assist, +2 safety/equipment       General transfer comment: STS from EOB and recliner with mod A +2 progressing to min A +2. Cues for hand placement, however pt unable to follow and continues to place hands on RW. Pt able to step pivot to recliner with dense cues for sequencing, however pt does not follow requiring increased assist for RW management, balance, and chair management due to pt attempting to sit before being in front of the chair    Ambulation/Gait               General Gait Details: unable to progress due to fatigue   Stairs             Wheelchair Mobility     Tilt Bed    Modified Rankin (Stroke Patients Only)       Balance Overall balance assessment: Needs assistance Sitting-balance support: Feet supported Sitting balance-Leahy Scale: Fair Sitting balance - Comments: sitting EOB   Standing balance support: Bilateral upper extremity supported, During functional activity, Reliant on assistive device for balance Standing balance-Leahy Scale: Poor Standing balance comment: with RW support                            Communication Communication Communication: Impaired Factors Affecting Communication: Difficulty expressing self  Cognition Arousal: Alert Behavior During Therapy: Flat affect   PT - Cognitive impairments: Initiation, Attention, Problem  solving, Awareness, Safety/Judgement Difficult to assess due to: Impaired communication                       Following commands: Impaired Following commands impaired: Follows one step commands inconsistently, Follows one step commands with increased time    Cueing Cueing Techniques: Tactile cues, Visual cues, Verbal cues  Exercises       General Comments        Pertinent Vitals/Pain Pain Assessment Pain Assessment: No/denies pain     PT Goals (current goals can now be found in the care plan section) Acute Rehab PT Goals Patient Stated Goal: improve pt mobility PT Goal Formulation: With family Time For Goal Achievement: 11/10/23 Progress towards PT goals: Progressing toward goals    Frequency    Min 2X/week       AM-PAC PT 6 Clicks Mobility   Outcome Measure  Help needed turning from your back to your side while in a flat bed without using bedrails?: A Lot Help needed moving from lying on your back to sitting on the side of a flat bed without using bedrails?: A Lot Help needed moving to and from a bed to a chair (including a wheelchair)?: A Lot Help needed standing up from a chair using your arms (e.g., wheelchair or bedside chair)?: A Lot Help needed to walk in hospital room?: Total Help needed climbing 3-5 steps with a railing? : Total 6 Click Score: 10    End of Session Equipment Utilized During Treatment: Gait belt Activity Tolerance: Patient tolerated treatment well Patient left: in chair;with chair alarm set;with call bell/phone within reach;with family/visitor present;Other (comment) (SLP present) Nurse Communication: Mobility status PT Visit Diagnosis: Other abnormalities of gait and mobility (R26.89);Other symptoms and signs involving the nervous system (R29.898);Muscle weakness (generalized) (M62.81)     Time: 8868-8850 PT Time Calculation (min) (ACUTE ONLY): 18 min  Charges:    $Therapeutic Activity: 8-22 mins PT General Charges $$ ACUTE PT VISIT: 1 Visit                     Darryle George, PTA Acute Rehabilitation Services Secure Chat Preferred  [phone]    Darryle George 11/06/2023, 2:52 PM

## 2023-11-06 NOTE — Progress Notes (Signed)
 PROGRESS NOTE  Connor Dixon FMW:[id number] DOB: [birth year] DOA: 10/10/2023 PCP: [name], MD   LOS: 27 days   Brief Narrative / Interim history: 89 year old with past medical history significant for chronic diastolic heart failure, complete heart block status post pacemaker, CKD 3, diabetes, hypertension, hyperlipidemia who was admitted after being found slumped over in his chair at home and unresponsive.  Last known well was the evening prior to admission on 10/09/2023.  Baseline he is considered high functioning and ambulates independently and lives alone. Evaluation consistent with CT head no acute process.  CTA head and neck demonstrated no large vessel occlusion.  There was evidence of a remote lacunar infarct right corona radiata.  MRI was negative for acute intracranial abnormality.  Evidence for chronic small vessels disease.  EEG unremarkable.  LP performed 10/13/2023 has  ruled out meningitis.  Antibiotics for meningitis were discontinued by neurology.  Patient received a steroid to cover Autoimmune component and subsequently discontinued 10/14/2023. He also completed IV IG for Autoimmune encephalopathy. Serum auto - immune panel performed at [facility] was negative. CSF Autoimmune panel sent to [facility] clinic supposed to be back today, will call this afternoon, [phone], account # [id number]  Subjective / 24h Interval events: Awake, alert, remains confused  Assesement and Plan: Principal Problem:   Acute encephalopathy Active Problems:   Hyperlipidemia with target LDL less than 70   Essential hypertension   Sinus node dysfunction (HCC)   Insulin -requiring or dependent type II diabetes mellitus (HCC)   Paroxysmal atrial fibrillation (HCC)   Elevated troponin   Prolonged QT interval   Palliative care by specialist  Principal problem Acute Metabolic Encephalopathy - MRI Negative for acute stroke, EEG no acute seizures.  LP- for meningitis. -Completed 5 days of IVIG on 6//2025  not significant improvement noted. -Ammonia 22, ACTH  test negative, Thiamine  107, HIV non reactive, RPR non reactive, ANA negative, VDRL CSF negative, no oligoclonal band seen on CSF, meningitis encephalitis panel negative, CSF culture negative. Blood culture negative, TSH: 1.0 - Repeated MRI 6/3 with contrast was negative for acute finding, mention chronic small vessel disease, moderate generalized atrophy and white matter disease bilaterally.  Chronic CVA. - B12 low normal (270) getting intramuscular injection .  - Still in the differential of rapid neurocognitive decline from frontotemporal dementia versus global cognitive decline.   -Auto-immune panel Serum - Autoimmune  encephalopathy antibody negative,  Anti-Hu antibody negative, antineuronal antibody negative,  anti-Yo antibody negative,Amphiphysin Antibody Negative, mGluR1 Antibody Negative, Ma2/Ta Antibody Negative, GABA-B-R Antibody    Negative.    -CSF: Autoimmune panel sent to [facility] per [facility], results are still pending today -I sent referral to out patient movement disorder at [facility].  -MS stable, fluctuates.  -Check Nocturnal Pulse Oxymetry. He had 4 desaturations with Oxygen sat 72--80 from 14 to 104 seconds , place on Nocturnal oxygen    Active problems Suspected cognitive impairment, POA - Patient had some more forgetfulness with directions, cooking/prepping meals, finances, and not driving lawn mower well . Could be possible that cognitive impairment became acutely worse in setting of acute illness. Referral to [facility] made.   BL Ronchus - Flutter valve. Incentive spirometry. Duo-neb TID.   Acute kidney injury on chronic CKD 3 - Baseline Appears to be 1.5-1.6.  Creatinine peaked to 2.5, now overall stable at 1.7 this morning  Fever -6/17, cultures were sent, started on linezolid  and Unasyn .  Possible aspiration, currently narrowed to Augmentin  alone for total of 5 days.  White count 3.3, he is  afebrile  Insulin -dependent diabetes type 2 -Last A1c 8.7% on 09/06/2023. Some intermittent hypoglycemia initially, still 1 episode last night but improved later on  Lab Results  Component Value Date   HGBA1C 8.7 (H) 09/06/2023   CBG (last 3)  Recent Labs    11/05/23 1927 11/05/23 2117 11/06/23 0625  GLUCAP 130* 72 120*   Essential hypertension - Continue with amlodipine , blood pressure remains stable   Elevated troponin - Echo unremarkable, likely demand ischemia   Acute urinary retention -Foley discontinued 6/2, good urinary output.  Started on Flomax    Hyperlipidemia - not able to tolerate regular statin   Prolonged QT, Sinus node dysfunction (HCC) - s/p pacemaker   Rash chest left shoulder - on Bacitracin    Pain with Urination -UA no significant WBC, No nitrates. Urine culture. Insignificant growth. Renal US ; no hydronephrosis, circumferential of bladder cystitis vs Outlet obstruction. Discussed with Urology, bladder thickening might be from age as well. Renal cyst on US , follow out patient.    Scheduled Meds:  amLODipine   10 mg Oral Daily   apixaban   2.5 mg Oral BID   bacitracin    Topical BID   Chlorhexidine  Gluconate Cloth  6 each Topical Daily   vitamin B-12  1,000 mcg Oral Daily   feeding supplement (GLUCERNA SHAKE)  237 mL Oral Q24H   ferrous sulfate   325 mg Oral Q breakfast   fluticasone   1 spray Each Nare Daily   folic acid   1 mg Oral Daily   insulin  aspart  0-5 Units Subcutaneous QHS   insulin  aspart  0-9 Units Subcutaneous TID WC   insulin  aspart  3 Units Subcutaneous TID WC   insulin  glargine-yfgn  14 Units Subcutaneous Daily   metoprolol  tartrate  12.5 mg Oral BID   mupirocin  ointment  1 Application Nasal BID   sodium bicarbonate   1,300 mg Oral BID   tamsulosin   0.4 mg Oral Daily   Continuous Infusions:   PRN Meds:.acetaminophen  **OR** acetaminophen  (TYLENOL ) oral liquid 160 mg/5 mL **OR** acetaminophen , artificial tears, hydrALAZINE ,  ipratropium-albuterol , mouth rinse  Current Outpatient Medications  Medication Instructions   allopurinol  (ZYLOPRIM ) 100 MG tablet TAKE 1 TABLET(100 MG) BY MOUTH DAILY   amoxicillin  (AMOXIL ) 500 MG capsule TAKE ONE CAPSULE BY MOUTH THREE TIMES DAILY. TAKE ONLY WHEN HAVING DENTAL PROCEDURES   apixaban  (ELIQUIS ) 2.5 mg, Oral, 2 times daily   calcitRIOL  (ROCALTROL ) 0.25 MCG capsule TAKE 1 CAPSULE BY MOUTH DAILY RETURN IN ABOUT 6 MONTHS(AROUND 02/04/2023)   carvedilol  (COREG ) 6.25 MG tablet TAKE 1 TABLET(6.25 MG) BY MOUTH TWICE DAILY WITH A MEAL   Continuous Glucose Sensor (DEXCOM G7 SENSOR) MISC 1 Act, Does not apply, Daily   furosemide  (LASIX ) 40 MG tablet Take 2 tablets in the morning and 1 tablet in the afternoon.   Glucagon  (GVOKE HYPOPEN  2-PACK) 1 MG/0.2ML SOAJ 1 Act, Subcutaneous, Daily PRN   insulin  isophane & regular human KwikPen (HUMULIN  70/30 KWIKPEN) (70-30) 100 UNIT/ML KwikPen 40 units in AM and 25 units in PM   levocetirizine (XYZAL ) 5 mg, Oral, Every evening   loratadine (CLARITIN) 10 mg, Oral, Daily PRN   losartan  (COZAAR ) 25 mg, Oral, Daily   pravastatin  (PRAVACHOL ) 40 mg, Oral, Daily, TAKE 1 TABLET(40 MG) BY MOUTH DAILY    Diet Orders (From admission, onward)     Start     Ordered   10/29/23 1708  DIET - DYS 1 Room service appropriate? No; Fluid consistency: Thin  Diet effective now  Comments: Carb Modified.  Question Answer Comment  Room service appropriate? No   Fluid consistency: Thin      10/29/23 1708            DVT prophylaxis: apixaban  (ELIQUIS ) tablet 2.5 mg Start: 10/15/23 1430 SCDs Start: 10/15/23 1335 SCD's Start: 10/10/23 2338 apixaban  (ELIQUIS ) tablet 2.5 mg   Lab Results  Component Value Date   PLT 296 11/06/2023      Code Status: Limited: Do not attempt resuscitation (DNR) -DNR-LIMITED -Do Not Intubate/DNI   Family Communication: Discussed with son at bedside 6/22  Status is: Inpatient Remains inpatient appropriate because: severity  of illness  Level of care: Progressive  Consultants:  Neurology   Objective: Vitals:   11/05/23 2317 11/06/23 0338 11/06/23 0757 11/06/23 0914  BP: (!) 111/55 129/73 132/82 132/82  Pulse: 79 84 95 95  Resp:  18 18   Temp: 98.4 F (36.9 C) 98.2 F (36.8 C) 98.8 F (37.1 C)   TempSrc: Oral Oral Oral   SpO2: 94% 97% 97%   Weight:      Height:        Intake/Output Summary (Last 24 hours) at 11/06/2023 0928 Last data filed at 11/05/2023 1850 Gross per 24 hour  Intake 318 ml  Output 650 ml  Net -332 ml   Wt Readings from Last 3 Encounters:  10/10/23 91 kg  09/06/23 90.4 kg  05/08/23 92.1 kg    Examination:  Constitutional: NAD Eyes: lids and conjunctivae normal, no scleral icterus ENMT: mmm Neck: normal, supple Respiratory: clear to auscultation bilaterally, no wheezing, no crackles.  Cardiovascular: Regular rate and rhythm, no murmurs / rubs / gallops. No LE edema. Abdomen: soft, no distention, no tenderness. Bowel sounds positive.    Data Reviewed: I have independently reviewed following labs and imaging studies   CBC Recent Labs  Lab 10/31/23 0832 11/02/23 0429 11/04/23 0703 11/06/23 0447  WBC 3.2* 3.8* 2.9* 3.3*  HGB 12.0* 11.2* 11.9* 10.9*  HCT 35.8* 32.9* 36.0* 32.8*  PLT 189 227 261 296  MCV 90.9 89.4 90.9 91.4  MCH 30.5 30.4 30.1 30.4  MCHC 33.5 34.0 33.1 33.2  RDW 14.7 14.6 14.8 14.9  LYMPHSABS  --  1.1  --  1.3  MONOABS  --  0.7  --  0.6  EOSABS  --  0.4  --  0.3  BASOSABS  --  0.0  --  0.0    Recent Labs  Lab 10/31/23 0832 11/01/23 0456 11/02/23 0429 11/04/23 0703 11/06/23 0447  NA 134* 134* 134* 139 138  K 4.3 4.0 3.7 3.8 4.0  CL 107 105 104 107 109  CO2 17* 20* 22 25 24   GLUCOSE 241* 206* 95 174* 100*  BUN 26* 23 23 21 20   CREATININE 1.70* 1.63* 1.81* 1.68* 1.78*  CALCIUM 8.5* 8.0* 8.2* 8.2* 7.6*  AST  --   --  36 50* 39  ALT  --   --  25 33 27  ALKPHOS  --   --  56 57 55  BILITOT  --   --  0.6 0.5 0.6  ALBUMIN  --   --   2.0* 1.9* 1.8*  MG  --   --  1.8 1.9 2.0    ------------------------------------------------------------------------------------------------------------------ No results for input(s): CHOL, HDL, LDLCALC, TRIG, CHOLHDL, LDLDIRECT in the last 72 hours.  Lab Results  Component Value Date   HGBA1C 8.7 (H) 09/06/2023   ------------------------------------------------------------------------------------------------------------------ No results for input(s): TSH, T4TOTAL, T3FREE, THYROIDAB in the last  72 hours.  Invalid input(s): FREET3  Cardiac Enzymes No results for input(s): CKMB, TROPONINI, MYOGLOBIN in the last 168 hours.  Invalid input(s): CK ------------------------------------------------------------------------------------------------------------------ No results found for: BNP  CBG: Recent Labs  Lab 11/05/23 1133 11/05/23 1638 11/05/23 1927 11/05/23 2117 11/06/23 0625  GLUCAP 176* 205* 130* 72 120*    Recent Results (from the past 240 hours)  Urine Culture (for pregnant, neutropenic or urologic patients or patients with an indwelling urinary catheter)     Status: Abnormal   Collection Time: 10/28/23  7:04 AM   Specimen: Urine, Clean Catch  Result Value Ref Range Status   Specimen Description URINE, CLEAN CATCH  Final   Special Requests NONE  Final   Culture (A)  Final    <10,000 COLONIES/mL INSIGNIFICANT GROWTH Performed at [facility], [address] [place], [place] [address]    Report Status 10/29/2023 FINAL  Final  MRSA Next Gen by PCR, Nasal     Status: Abnormal   Collection Time: 10/31/23  7:45 AM   Specimen: Nasal Mucosa; Nasal Swab  Result Value Ref Range Status   MRSA by PCR Next Gen DETECTED (A) NOT DETECTED Final    Comment: RESULT CALLED TO, READ BACK BY AND VERIFIED WITH: RN MAIRE MARUS [id number] AT 940 AM BY CM (NOTE) The GeneXpert MRSA Assay (FDA approved for NASAL specimens only), is one component of a  comprehensive MRSA colonization surveillance program. It is not intended to diagnose MRSA infection nor to guide or monitor treatment for MRSA infections. Test performance is not FDA approved in patients less than 40 years old. Performed at [facility], [address], [place], [place] [address]   Culture, blood (Routine X 2) w Reflex to ID Panel     Status: None   Collection Time: 10/31/23  8:32 AM   Specimen: BLOOD LEFT ARM  Result Value Ref Range Status   Specimen Description BLOOD LEFT ARM  Final   Special Requests   Final    BOTTLES DRAWN AEROBIC ONLY Blood Culture results may not be optimal due to an inadequate volume of blood received in culture bottles   Culture   Final    NO GROWTH 5 DAYS Performed at [facility], [address], [place], [place] [address]    Report Status 11/05/2023 FINAL  Final  Culture, blood (Routine X 2) w Reflex to ID Panel     Status: None   Collection Time: 10/31/23  8:33 AM   Specimen: BLOOD LEFT HAND  Result Value Ref Range Status   Specimen Description BLOOD LEFT HAND  Final   Special Requests   Final    BOTTLES DRAWN AEROBIC ONLY Blood Culture results may not be optimal due to an inadequate volume of blood received in culture bottles   Culture   Final    NO GROWTH 5 DAYS Performed at [facility], [address] [place], [place] [address]    Report Status 11/05/2023 FINAL  Final     Radiology Studies: No results found.   Nilda Fendt, MD, PhD [facility]  Between 7 am - 7 pm I am available, please contact me via Amion (for emergencies) or Securechat (non urgent messages)  Between 7 pm - 7 am I am not available, please contact night coverage MD/APP via Amion

## 2023-11-06 NOTE — Plan of Care (Signed)
 Pt on the neurology list for f/u autoimmune csf panel. Results not in EMR, Called [facility] [phone], and provided acct# [id number]). They will check with [facility] where it is sent. Will update when I get more information   UPDATE [facility] said there is a delay due to supply shortage No ETA Can be followed outpatient from where I see it - not sure if has any other reason to remain inpatient.  Relayed to Dr. Vianne    --- Eligio Lav, MD Neurology

## 2023-11-06 NOTE — TOC Progression Note (Signed)
 Transition of Care [facility]) - Progression Note    Patient Details  Name: Connor Dixon MRN: [id number] Date of Birth: 07-11-1934  [facility] CM/SW Contact  Andrez JULIANNA George, RN Phone Number: 11/06/2023, 12:06 PM  Clinical Narrative:     Continue to await results from [facility] to determine d/c destination. Pt may need a tertiary center vs SNF.  TOC following.  Expected Discharge Plan: Skilled Nursing Facility Barriers to Discharge: Insurance Authorization  Expected Discharge Plan and Services                                               Social Determinants of Health (SDOH) Interventions SDOH Screenings   Food Insecurity: Patient Unable To Answer (10/13/2023)  Housing: Patient Unable To Answer (10/13/2023)  Transportation Needs: Patient Unable To Answer (10/13/2023)  Utilities: Patient Unable To Answer (10/13/2023)  Alcohol  Screen: Low Risk  (04/06/2023)  Depression (PHQ2-9): Low Risk  (09/06/2023)  Financial Resource Strain: Low Risk  (04/06/2023)  Physical Activity: Inactive (04/06/2023)  Social Connections: Unknown (10/13/2023)  Stress: No Stress Concern Present (04/06/2023)  Tobacco Use: Low Risk  (10/10/2023)  Health Literacy: Adequate Health Literacy (04/06/2023)    Readmission Risk Interventions     No data to display

## 2023-11-06 NOTE — Progress Notes (Signed)
 Speech Language Pathology Treatment: Cognitive-Linguistic  Patient Details Name: Connor Dixon MRN: [id number] DOB: 1935/04/27 Today's Date: 11/06/2023 Time: 8852-8797 SLP Time Calculation (min) (ACUTE ONLY): 15 min  Assessment / Plan / Recommendation Clinical Impression  Pt alert, up in chair with son present for communication/cognition intervention. He was able to follow verbal commands today, one step without cues with 100% which he had been previously unable to do. Attempted to describe a picture scene but had difficulty focusing and distracted by clear sleeve picture was in. When taken out of sleeve he was only able name one item and cues not effective. Pt had more success when naming common objects with 50% acc (less than last week) and benefited from phrase completion and phonemic cues. Educated son to use this type of cueing to assist in naming. He was able to read one of his get well cards accurately. Son states he is improving in letting family know his needs (he now verbally tells them when he has has enough to eat). ST to continue treatment.    HPI HPI: Pt is 89 year old presented to [facility] on  10/10/23 after found at home unresponsive. Pt with dense encephalopathy with flexor positioning of UE's. CT negative for acute changes and MRI reports No acute intracranial abnormality by noncontrast MRI.  2. Signal changes compatible with chronic small vessel disease. Generalized appearing brain volume loss.  PMH - afib on Eliquis , pacer, HTN, CKD, DM, obesity      SLP Plan  Continue with current plan of care          Recommendations                     Oral care BID   Frequent or constant Supervision/Assistance Aphasia (R47.01)     Continue with current plan of care     Dustin Olam Bull  11/06/2023, 12:11 PM

## 2023-11-06 NOTE — Plan of Care (Signed)
  Problem: Coping: Goal: Will verbalize positive feelings about self Outcome: Progressing   Problem: Health Behavior/Discharge Planning: Goal: Goals will be collaboratively established with patient/family Outcome: Progressing   Problem: Nutrition: Goal: Risk of aspiration will decrease Outcome: Progressing   Problem: Activity: Goal: Risk for activity intolerance will decrease Outcome: Progressing

## 2023-11-07 LAB — GLUCOSE, CAPILLARY
Glucose-Capillary: 159 mg/dL — ABNORMAL HIGH (ref 70–99)
Glucose-Capillary: 158 mg/dL — ABNORMAL HIGH (ref 70–99)
Glucose-Capillary: 148 mg/dL — ABNORMAL HIGH (ref 70–99)
Glucose-Capillary: 135 mg/dL — ABNORMAL HIGH (ref 70–99)

## 2023-11-07 MED ORDER — AMLODIPINE BESYLATE 10 MG PO TABS: 10.0000 mg | ORAL_TABLET | Freq: Every day | ORAL | Status: DC

## 2023-11-07 MED ORDER — CYANOCOBALAMIN 1000 MCG PO TABS: 1000.0000 ug | ORAL_TABLET | Freq: Every day | ORAL | Status: AC

## 2023-11-07 MED ORDER — FERROUS SULFATE 325 (65 FE) MG PO TABS
325.0000 mg | ORAL_TABLET | Freq: Every day | ORAL | Status: AC
Start: 2023-11-08 — End: ?

## 2023-11-07 MED ORDER — METOPROLOL TARTRATE 25 MG PO TABS: 12.5000 mg | ORAL_TABLET | Freq: Two times a day (BID) | ORAL | Status: DC

## 2023-11-07 MED ORDER — GLUCERNA SHAKE PO LIQD: 237.0000 mL | ORAL | Status: AC

## 2023-11-07 MED ORDER — FOLIC ACID 1 MG PO TABS
1.0000 mg | ORAL_TABLET | Freq: Every day | ORAL | Status: AC
Start: 2023-11-08 — End: ?

## 2023-11-07 MED ORDER — FUROSEMIDE 40 MG PO TABS: 40.0000 mg | ORAL_TABLET | Freq: Every day | ORAL | Status: DC | PRN

## 2023-11-07 MED ORDER — TAMSULOSIN HCL 0.4 MG PO CAPS
0.4000 mg | ORAL_CAPSULE | Freq: Every day | ORAL | Status: AC
Start: 2023-11-08 — End: ?

## 2023-11-07 NOTE — Plan of Care (Signed)
  Problem: Health Behavior/Discharge Planning: Goal: Goals will be collaboratively established with patient/family Outcome: Progressing   Problem: Self-Care: Goal: Ability to participate in self-care as condition permits will improve Outcome: Progressing   Problem: Nutrition: Goal: Risk of aspiration will decrease Outcome: Progressing Goal: Dietary intake will improve Outcome: Progressing   Problem: Education: Goal: Knowledge of General Education information will improve Description: Including pain rating scale, medication(s)/side effects and non-pharmacologic comfort measures Outcome: Progressing   Problem: Activity: Goal: Risk for activity intolerance will decrease Outcome: Progressing

## 2023-11-07 NOTE — Plan of Care (Signed)
  Problem: Fluid Volume: Goal: Ability to maintain a balanced intake and output will improve Outcome: Progressing   Problem: Metabolic: Goal: Ability to maintain appropriate glucose levels will improve Outcome: Progressing   Problem: Nutritional: Goal: Maintenance of adequate nutrition will improve Outcome: Progressing   Problem: Skin Integrity: Goal: Risk for impaired skin integrity will decrease Outcome: Progressing   Problem: Tissue Perfusion: Goal: Adequacy of tissue perfusion will improve Outcome: Progressing   Problem: Nutrition: Goal: Risk of aspiration will decrease Outcome: Progressing Goal: Dietary intake will improve Outcome: Progressing   Problem: Clinical Measurements: Goal: Ability to maintain clinical measurements within normal limits will improve Outcome: Progressing Goal: Will remain free from infection Outcome: Progressing Goal: Diagnostic test results will improve Outcome: Progressing Goal: Respiratory complications will improve Outcome: Progressing Goal: Cardiovascular complication will be avoided Outcome: Progressing   Problem: Nutrition: Goal: Adequate nutrition will be maintained Outcome: Progressing   Problem: Coping: Goal: Level of anxiety will decrease Outcome: Progressing   Problem: Elimination: Goal: Will not experience complications related to bowel motility Outcome: Progressing Goal: Will not experience complications related to urinary retention Outcome: Progressing   Problem: Pain Managment: Goal: General experience of comfort will improve and/or be controlled Outcome: Progressing   Problem: Safety: Goal: Ability to remain free from injury will improve Outcome: Progressing   Problem: Skin Integrity: Goal: Risk for impaired skin integrity will decrease Outcome: Progressing

## 2023-11-07 NOTE — Progress Notes (Signed)
 PROGRESS NOTE  ACESON LABELL FMW:[id number] DOB: Aug 17, 1934 DOA: 10/10/2023 PCP: [name], MD   LOS: 28 days   Brief Narrative / Interim history: 89 year old with past medical history significant for chronic diastolic heart failure, complete heart block status post pacemaker, CKD 3, diabetes, hypertension, hyperlipidemia who was admitted after being found slumped over in his chair at home and unresponsive.  Last known well was the evening prior to admission on 10/09/2023.  Baseline he is considered high functioning and ambulates independently and lives alone. Evaluation consistent with CT head no acute process.  CTA head and neck demonstrated no large vessel occlusion.  There was evidence of a remote lacunar infarct right corona radiata.  MRI was negative for acute intracranial abnormality.  Evidence for chronic small vessels disease.  EEG unremarkable.  LP performed 10/13/2023 has  ruled out meningitis.  Antibiotics for meningitis were discontinued by neurology.  Patient received a steroid to cover Autoimmune component and subsequently discontinued 10/14/2023. He also completed IV IG for Autoimmune encephalopathy. Serum auto - immune panel performed at [facility] was negative. CSF Autoimmune panel sent to [facility] clinic, for results can call [phone], account # [id number].  Last discussion on 6/23 by neurology with a lab, apparently [facility] run out of some reagents and the test results will be delayed for an unknown amount of time  Subjective / 24h Interval events: Awake, alert, remains confused  Assesement and Plan: Principal Problem:   Acute encephalopathy Active Problems:   Hyperlipidemia with target LDL less than 70   Essential hypertension   Sinus node dysfunction (HCC)   Insulin -requiring or dependent type II diabetes mellitus (HCC)   Paroxysmal atrial fibrillation (HCC)   Elevated troponin   Prolonged QT interval   Palliative care by specialist  Principal problem Acute  Metabolic Encephalopathy - MRI Negative for acute stroke, EEG no acute seizures.  LP was negative for meningitis. -Completed 5 days of IVIG on 6//2025 not significant improvement noted. -Ammonia 22, ACTH  test negative, Thiamine  107, HIV non reactive, RPR non reactive, ANA negative, VDRL CSF negative, no oligoclonal band seen on CSF, meningitis encephalitis panel negative, CSF culture negative. Blood culture negative, TSH: 1.0 - Repeated MRI 6/3 with contrast was negative for acute finding, mention chronic small vessel disease, moderate generalized atrophy and white matter disease bilaterally.  Chronic CVA. - B12 low normal (270) getting supplementation now - Still in the differential of rapid neurocognitive decline from frontotemporal dementia versus global cognitive decline.   -Auto-immune panel Serum - Autoimmune  encephalopathy antibody negative,  Anti-Hu antibody negative, antineuronal antibody negative,  anti-Yo antibody negative,Amphiphysin Antibody Negative, mGluR1 Antibody Negative, Ma2/Ta Antibody Negative, GABA-B-R Antibody    Negative.    -CSF: Autoimmune panel sent to [facility] per lab corp, results are pending.  Most recent update on 6/23, [facility] ran out of some reagents and the estimated time to result is unknown at this time -referral sent to out patient movement disorder at [facility].  -MS stable, fluctuates.  Overall improving per patient's son's -Check Nocturnal Pulse Oxymetry. He had 4 desaturations with Oxygen sat 72--80 from 14 to 104 seconds , place on Nocturnal oxygen    Active problems Disposition -hopes were for the patient to get the [facility] results, and if positive potentially to do hospital to hospital transfer to [facility] or [facility] for potentially advanced treatment.  In light of this, family did not want patient to be discharged.  Now that [facility] test has not resulted and  is unclear whether we will be several more weeks before test will be run, family agrees to SNF  transfer but they are asking who is going to follow-up on the results and who can act on them.  CM will set up an outpatient follow-up with [facility] neurology so that there is a neurologist provider in the outpatient realm.  TOC now involved for placement  Suspected cognitive impairment, POA - Patient had some more forgetfulness with directions, cooking/prepping meals, finances, and not driving lawn mower well . Could be possible that cognitive impairment became acutely worse in setting of acute illness. Referral to Outpatient [facility] clinic made.   BL Ronchus - Flutter valve. Incentive spirometry. Duo-neb TID.   Acute kidney injury on chronic CKD 3 - Baseline Appears to be 1.5-1.6.  Creatinine peaked to 2.5, overall stable now  Fever -6/17, cultures were sent, started on linezolid  and Unasyn .  Possible aspiration, currently narrowed to Augmentin  alone for total of 5 days.  White count 3.3, he is afebrile  Insulin -dependent diabetes type 2 -Last A1c 8.7% on 09/06/2023. Some intermittent hypoglycemia initially, still 1 episode last night but improved later on  Lab Results  Component Value Date   HGBA1C 8.7 (H) 09/06/2023   CBG (last 3)  Recent Labs    11/06/23 1611 11/06/23 2115 11/07/23 0624  GLUCAP 136* 131* 159*   Essential hypertension - Continue with amlodipine , blood pressure stable   Elevated troponin - Echo unremarkable, likely demand ischemia   Acute urinary retention -Foley discontinued 6/2, good urinary output.  Started on Flomax    Hyperlipidemia - not able to tolerate regular statin   Prolonged QT, Sinus node dysfunction (HCC) - s/p pacemaker  Rash chest left shoulder - on Bacitracin   Pain with Urination -UA no significant WBC, No nitrates. Urine culture. Insignificant growth. Renal US ; no hydronephrosis, circumferential of bladder cystitis vs Outlet obstruction. Discussed with Urology, bladder thickening might be from age as well. Renal cyst on US , follow out patient.   -Resolved  Scheduled Meds:  amLODipine   10 mg Oral Daily   apixaban   2.5 mg Oral BID   bacitracin    Topical BID   vitamin B-12  1,000 mcg Oral Daily   feeding supplement (GLUCERNA SHAKE)  237 mL Oral Q24H   ferrous sulfate   325 mg Oral Q breakfast   fluticasone   1 spray Each Nare Daily   folic acid   1 mg Oral Daily   insulin  aspart  0-5 Units Subcutaneous QHS   insulin  aspart  0-9 Units Subcutaneous TID WC   insulin  aspart  3 Units Subcutaneous TID WC   insulin  glargine-yfgn  14 Units Subcutaneous Daily   metoprolol  tartrate  12.5 mg Oral BID   mupirocin  ointment  1 Application Nasal BID   sodium bicarbonate   1,300 mg Oral BID   tamsulosin   0.4 mg Oral Daily   Continuous Infusions:   PRN Meds:.acetaminophen  **OR** acetaminophen  (TYLENOL ) oral liquid 160 mg/5 mL **OR** acetaminophen , artificial tears, hydrALAZINE , ipratropium-albuterol , mouth rinse  Current Outpatient Medications  Medication Instructions   allopurinol  (ZYLOPRIM ) 100 MG tablet TAKE 1 TABLET(100 MG) BY MOUTH DAILY   amoxicillin  (AMOXIL ) 500 MG capsule TAKE ONE CAPSULE BY MOUTH THREE TIMES DAILY. TAKE ONLY WHEN HAVING DENTAL PROCEDURES   apixaban  (ELIQUIS ) 2.5 mg, Oral, 2 times daily   calcitRIOL  (ROCALTROL ) 0.25 MCG capsule TAKE 1 CAPSULE BY MOUTH DAILY RETURN IN ABOUT 6 MONTHS(AROUND 02/04/2023)   carvedilol  (COREG ) 6.25 MG tablet TAKE 1 TABLET(6.25 MG) BY MOUTH TWICE DAILY WITH  A MEAL   Continuous Glucose Sensor (DEXCOM G7 SENSOR) MISC 1 Act, Does not apply, Daily   furosemide  (LASIX ) 40 MG tablet Take 2 tablets in the morning and 1 tablet in the afternoon.   Glucagon  (GVOKE HYPOPEN  2-PACK) 1 MG/0.2ML SOAJ 1 Act, Subcutaneous, Daily PRN   insulin  isophane & regular human KwikPen (HUMULIN  70/30 KWIKPEN) (70-30) 100 UNIT/ML KwikPen 40 units in AM and 25 units in PM   levocetirizine (XYZAL ) 5 mg, Oral, Every evening   loratadine (CLARITIN) 10 mg, Oral, Daily PRN   losartan  (COZAAR ) 25 mg, Oral, Daily   pravastatin   (PRAVACHOL ) 40 mg, Oral, Daily, TAKE 1 TABLET(40 MG) BY MOUTH DAILY    Diet Orders (From admission, onward)     Start     Ordered   10/29/23 1708  DIET - DYS 1 Room service appropriate? No; Fluid consistency: Thin  Diet effective now       Comments: Carb Modified.  Question Answer Comment  Room service appropriate? No   Fluid consistency: Thin      10/29/23 1708            DVT prophylaxis: apixaban  (ELIQUIS ) tablet 2.5 mg Start: 10/15/23 1430 SCDs Start: 10/15/23 1335 SCD's Start: 10/10/23 2338 apixaban  (ELIQUIS ) tablet 2.5 mg   Lab Results  Component Value Date   PLT 296 11/06/2023      Code Status: Limited: Do not attempt resuscitation (DNR) -DNR-LIMITED -Do Not Intubate/DNI   Family Communication: Discussed with son at bedside 6/24  Status is: Inpatient Remains inpatient appropriate because: severity of illness  Level of care: Progressive  Consultants:  Neurology   Objective: Vitals:   11/06/23 2342 11/07/23 0500 11/07/23 0722 11/07/23 0955  BP: 115/80 139/78 126/65 126/65  Pulse: 91 88 89 89  Resp:   18   Temp: 99.2 F (37.3 C) 98.2 F (36.8 C) 98.9 F (37.2 C)   TempSrc: Oral Oral Oral   SpO2: 96% 96% 97%   Weight:      Height:        Intake/Output Summary (Last 24 hours) at 11/07/2023 1059 Last data filed at 11/07/2023 9044 Gross per 24 hour  Intake --  Output 2000 ml  Net -2000 ml   Wt Readings from Last 3 Encounters:  10/10/23 91 kg  09/06/23 90.4 kg  05/08/23 92.1 kg    Examination:  Constitutional: NAD Eyes: lids and conjunctivae normal, no scleral icterus ENMT: mmm Neck: normal, supple Respiratory: clear to auscultation bilaterally, no wheezing, no crackles. Normal respiratory effort.  Cardiovascular: Regular rate and rhythm, no murmurs / rubs / gallops. No LE edema. Abdomen: soft, no distention, no tenderness. Bowel sounds positive.   Data Reviewed: I have independently reviewed following labs and imaging studies    CBC Recent Labs  Lab 11/02/23 0429 11/04/23 0703 11/06/23 0447  WBC 3.8* 2.9* 3.3*  HGB 11.2* 11.9* 10.9*  HCT 32.9* 36.0* 32.8*  PLT 227 261 296  MCV 89.4 90.9 91.4  MCH 30.4 30.1 30.4  MCHC 34.0 33.1 33.2  RDW 14.6 14.8 14.9  LYMPHSABS 1.1  --  1.3  MONOABS 0.7  --  0.6  EOSABS 0.4  --  0.3  BASOSABS 0.0  --  0.0    Recent Labs  Lab 11/01/23 0456 11/02/23 0429 11/04/23 0703 11/06/23 0447  NA 134* 134* 139 138  K 4.0 3.7 3.8 4.0  CL 105 104 107 109  CO2 20* 22 25 24   GLUCOSE 206* 95 174* 100*  BUN 23  23 21 20   CREATININE 1.63* 1.81* 1.68* 1.78*  CALCIUM 8.0* 8.2* 8.2* 7.6*  AST  --  36 50* 39  ALT  --  25 33 27  ALKPHOS  --  56 57 55  BILITOT  --  0.6 0.5 0.6  ALBUMIN  --  2.0* 1.9* 1.8*  MG  --  1.8 1.9 2.0    ------------------------------------------------------------------------------------------------------------------ No results for input(s): CHOL, HDL, LDLCALC, TRIG, CHOLHDL, LDLDIRECT in the last 72 hours.  Lab Results  Component Value Date   HGBA1C 8.7 (H) 09/06/2023   ------------------------------------------------------------------------------------------------------------------ No results for input(s): TSH, T4TOTAL, T3FREE, THYROIDAB in the last 72 hours.  Invalid input(s): FREET3  Cardiac Enzymes No results for input(s): CKMB, TROPONINI, MYOGLOBIN in the last 168 hours.  Invalid input(s): CK ------------------------------------------------------------------------------------------------------------------ No results found for: BNP  CBG: Recent Labs  Lab 11/06/23 0625 11/06/23 1116 11/06/23 1611 11/06/23 2115 11/07/23 0624  GLUCAP 120* 233* 136* 131* 159*    Recent Results (from the past 240 hours)  MRSA Next Gen by PCR, Nasal     Status: Abnormal   Collection Time: 10/31/23  7:45 AM   Specimen: Nasal Mucosa; Nasal Swab  Result Value Ref Range Status   MRSA by PCR Next Gen DETECTED (A) NOT  DETECTED Final    Comment: RESULT CALLED TO, READ BACK BY AND VERIFIED WITH: RN MAIRE MARUS [id number] AT 940 AM BY CM (NOTE) The GeneXpert MRSA Assay (FDA approved for NASAL specimens only), is one component of a comprehensive MRSA colonization surveillance program. It is not intended to diagnose MRSA infection nor to guide or monitor treatment for MRSA infections. Test performance is not FDA approved in patients less than 82 years old. Performed at [facility], [address], [place], [place] [address]   Culture, blood (Routine X 2) w Reflex to ID Panel     Status: None   Collection Time: 10/31/23  8:32 AM   Specimen: BLOOD LEFT ARM  Result Value Ref Range Status   Specimen Description BLOOD LEFT ARM  Final   Special Requests   Final    BOTTLES DRAWN AEROBIC ONLY Blood Culture results may not be optimal due to an inadequate volume of blood received in culture bottles   Culture   Final    NO GROWTH 5 DAYS Performed at [facility], [address], [place], [place] [address]    Report Status 11/05/2023 FINAL  Final  Culture, blood (Routine X 2) w Reflex to ID Panel     Status: None   Collection Time: 10/31/23  8:33 AM   Specimen: BLOOD LEFT HAND  Result Value Ref Range Status   Specimen Description BLOOD LEFT HAND  Final   Special Requests   Final    BOTTLES DRAWN AEROBIC ONLY Blood Culture results may not be optimal due to an inadequate volume of blood received in culture bottles   Culture   Final    NO GROWTH 5 DAYS Performed at [facility], [address], [place], [place] [address]    Report Status 11/05/2023 FINAL  Final     Radiology Studies: No results found.   Nilda Fendt, MD, PhD [facility]  Between 7 am - 7 pm I am available, please contact me via Amion (for emergencies) or Securechat (non urgent messages)  Between 7 pm - 7 am I am not available, please contact night coverage MD/APP via Amion

## 2023-11-07 NOTE — Progress Notes (Signed)
 Patient ID: Connor Dixon, male   DOB: 1935/01/10, 89 y.o.   MRN: [id number]    Progress Note from the Palliative Medicine Team at [facility]   Patient Name: Connor Dixon        Date: 11/07/2023 DOB: Dec 30, 1934  Age: 89 y.o. MRN#: [id number] Attending Physician: Connor Nilda HERO, MD Primary Care Physician: Connor Debby CROME, MD Admit Date: 10/10/2023   Reason for Consultation/Follow-up   Establishing Goals of Care   HPI/ Brief Hospital Review   89 y.o. male  with past medical history of A-fib (Eliquis ), s/p PPM for second-degree heart block, HTN, CKD (stage III), type 1 diabetes, morbid obesity, and HLD admitted on 10/10/2023 after being found down at home.  Last known well was 5/26 at 1700.   EGD revealed moderate diffuse encephalopathy with no seizures or epileptiform discharges noted.  Overnight EEG results pending.  Pending.  Workup remains in progress.  CT of the head revealed remote lacunar infarct in the anterior right corona radiata.  CTA revealed no evidence of significant stenosis, aneurysmal dilatation, or dissection involving the arteries of the head and neck. MRI WNL. Ongoing work-up, labs pending.   Day 27 of this hospital stay   PMT was consulted to support patient and family with goals of care discussions.  Initial consult 10/11/2023  Family face ongoing decisions regarding treatment options, advance care planning options and anticipatory care needs.   Subjective  Extensive chart review has been completed prior to meeting with patient/family  including labs, vital signs, imaging, progress/consult notes, orders, medications and available advance directive documents.    This NP assessed patient at the bedside as a follow up for palliative medicine needs and emotional support.  I discussed the patient with attending/Dr. Derinda.    Patient's speech is garbled, repeats yes and nods head to all questions,  and unable to follow commands currently.  He does not appear to be in  any discomfort currently.   I spoke with transition of care/Connor Dixon.  Continue to wait for results/CS F autoimmune panel from [facility].  Next steps for transition of care in process.    PMT will sign off at this time, please reconsult if we can be of any assistance in the future  If patient transitions to skilled nursing facility, I would recommend outpatient palliative services   Time:  25  minutes  Detailed review of medical records ( labs, imaging, vital signs), medically appropriate exam ( MS, skin, cardiac,  resp)   discussed with treatment team, counseling and education to patient, family, staff, documenting clinical information, medication management, coordination of care    Connor Plants NP  Palliative Medicine Team Team Phone # [phone] Pager [phone]

## 2023-11-07 NOTE — Plan of Care (Signed)
  Problem: Health Behavior/Discharge Planning: Goal: Goals will be collaboratively established with patient/family Outcome: Progressing   Problem: Self-Care: Goal: Ability to participate in self-care as condition permits will improve 11/07/2023 1042 by Berkeley Verdie BRAVO, RN Outcome: Progressing 11/07/2023 1041 by Berkeley Verdie BRAVO, RN Outcome: Progressing   Problem: Nutrition: Goal: Risk of aspiration will decrease Outcome: Progressing Goal: Dietary intake will improve Outcome: Progressing   Problem: Education: Goal: Knowledge of General Education information will improve Description: Including pain rating scale, medication(s)/side effects and non-pharmacologic comfort measures Outcome: Progressing   Problem: Activity: Goal: Risk for activity intolerance will decrease Outcome: Progressing

## 2023-11-07 NOTE — Progress Notes (Signed)
 Occupational Therapy Treatment Patient Details Name: Connor Dixon MRN: [id number] DOB: Apr 26, 1935 Today's Date: 11/07/2023   History of present illness Pt is 89 year old presented to [facility] on  10/10/23 after found at home unresponsive. Pt with dense encephalopathy with flexor positioning of UE's. LP performed on 10/13/2023 has ruled out meningitis. CT and MRI on 5/29 were negative for acute intercranial abnormality.  PMH - afib on Eliquis , pacer, HTN, CKD, DM, obesity   OT comments  Pt remains 9/24 on 6-Clicks AM-PAC measure of occupational Performance with previous score of 9/24, showing slow progress and inconsistent cognition as pt reportedly did better last OT session in terms of sitting balance and following commands. Pt did assist with bed mobility and followed some 1-step commands, but often unable or required multiple repetitions to follow a one step command despite visual and tactile cues to support verbal instructions. Pt continues to show Right sided pushing or leaning in supine and at EOB as well as continued disuse and guarding of LUE. Rigidity at BLEs.  Pt does cooperate and follow commands as able. Son in room very supportive and working with pt as well.   Pt continues to demonstrate fair rehab potential and would benefit from continued skilled OT to increase safety and independence with ADLs and functional transfers to allow pt to return home safely and reduce caregiver burden and fall risk. Continued occupational therapy services after discharge from acute care from continued inpatient follow up therapy, <3 hours/day is recommended.         If plan is discharge home, recommend the following:  Two people to help with walking and/or transfers;Two people to help with bathing/dressing/bathroom;Direct supervision/assist for medications management;Direct supervision/assist for financial management;Supervision due to cognitive status;Assist for transportation;Assistance with  cooking/housework;Help with stairs or ramp for entrance;A lot of help with bathing/dressing/bathroom   Equipment Recommendations   (defer)    Recommendations for Other Services      Precautions / Restrictions Precautions Precautions: Fall Recall of Precautions/Restrictions: Impaired Restrictions Weight Bearing Restrictions Per Provider Order: No       Mobility Bed Mobility Overal bed mobility: Needs Assistance Bed Mobility: Supine to Sit, Sit to Supine     Supine to sit: Max assist, Used rails Sit to supine: Max assist, Used rails   General bed mobility comments: Pt remains rigid esp to LEs. Pt required Total Assist to advance LEs off EOB but pt did assist more with grasping bed rail and helping to push trunk up to sitting with Max As.    Transfers                         Balance Overall balance assessment: Needs assistance Sitting-balance support: Feet supported Sitting balance-Leahy Scale: Poor Sitting balance - Comments: sitting EOB x15 min, pt still pushing to RT but less so. Worked on inhibitory LT elbow/forearm positioning at EOB which improved balance but for limited amount of time. Pt coud grasp foot of bed with LT hand and stay midlines for moments but then would return to RT pushing. On posterior LOB with Total Assist to return to upright sitting. Postural control: Posterior lean, Right lateral lean                                 ADL either performed or assessed with clinical judgement   ADL Overall ADL's : Needs assistance/impaired     Grooming:  Wash/dry face;Maximal assistance;Bed level;Cueing for sequencing;Moderate assistance;Sitting Grooming Details (indicate cue type and reason): Washcloth placed in pt's hand. 1-step instruction, [name], wash your face.  Pt began to remove his gown on LT side. Fidgeting with linens on bed and grasping OT's hand.  Increased time, and repetition of 1-step instruction and pt eventually responded, Oh  I know, wash my face and completed task with Mod-Max As.  Pt did brush teeth at EOB with brush dipped in mouth rinse solution and placed in RT hand. HAnd over hand to bring to mouth as pt not following verbal and visual cues. Total Assist to wipe mouth.         Upper Body Dressing : Total assistance;Bed level Upper Body Dressing Details (indicate cue type and reason): Pt trying to tear gown off on LT side. Son present and stated this is continuous throughout the day and always on L side. Lower Body Dressing: Total assistance;Bed level     Toilet Transfer Details (indicate cue type and reason): Pt's sitting balance very poor today therefore standing was not attemped with OT and son for safety.           General ADL Comments: Max A to tota A for ADLs, limited initiation, severe confusion.    Extremity/Trunk Assessment Upper Extremity Assessment Upper Extremity Assessment: Difficult to assess due to impaired cognition (Deficits seem more cognitive or neurological than muscular) RUE Coordination: decreased fine motor;decreased gross motor LUE Deficits / Details: Son endorses pt shows sign of continued LT shoulder pain. LUE Coordination: decreased fine motor;decreased gross motor   Lower Extremity Assessment Lower Extremity Assessment: Difficult to assess due to impaired cognition (Rigidity)   Cervical / Trunk Assessment Cervical / Trunk Assessment: Kyphotic    Vision Ability to See in Adequate Light: 3 Highly impaired Additional Comments: Poor visual attention to tasks or voices. Eyes open and looking around room but lacking purposeful focus.   Perception     Praxis Praxis Praxis: Impaired Praxis Impairment Details: Initiation;Motor planning   Communication Communication Communication: Impaired Factors Affecting Communication: Difficulty expressing self   Cognition Arousal: Alert Behavior During Therapy: Impulsive Cognition: Cognition impaired   Orientation impairments:   (None. But will answer to [facility]. Pt able to ID person in room as son but used wrong son's name and unable to correct with son cuing.) Awareness: Intellectual awareness impaired, Online awareness impaired Memory impairment (select all impairments): Declarative long-term memory, Working [employer] fast streamer, Short-term memory, Non-declarative long-term memory Attention impairment (select first level of impairment): Sustained attention, Focused attention Executive functioning impairment (select all impairments): Initiation, Sequencing, Reasoning, Organization, Problem solving OT - Cognition Comments: Pt worked on simple Q&A. Pt asked if he were in [place] or [place] . Pt reported [place] . Son in room lives in [place]  so very possible pt using this visual cue to orient self. No correct ID of common items in room.                 Following commands: Impaired Following commands impaired: Follows one step commands inconsistently, Follows one step commands with increased time      Cueing   Cueing Techniques: Tactile cues, Visual cues, Verbal cues, Gestural cues  Exercises      Shoulder Instructions       General Comments      Pertinent Vitals/ Pain       Pain Assessment Pain Assessment: PAINAD Facial Expression: Relaxed, neutral Body Movements: Absence of movements Muscle Tension: Tense, rigid (believes to be more neurological and not  pain response) Compliance with ventilator (intubated pts.): N/A Vocalization (extubated pts.): N/A CPOT Total: 1 Pain Location: L shoulder,-per son pt still seems to have some bother with LT shoulder. OT palpated entire GH joint and pt showed no sign of point tender pain. Pain Intervention(s): Monitored during session  Home Living                                          Prior Functioning/Environment              Frequency  Min 2X/week        Progress Toward Goals  OT Goals(current goals can now be found in the care plan section)   Progress towards OT goals: Progressing toward goals (Inconsistent progression)  Acute Rehab OT Goals Patient Stated Goal: Per son, for pt to keep trying and getting better. OT Goal Formulation: With family Time For Goal Achievement: 11/15/23 Potential to Achieve Goals: Fair  Plan      Co-evaluation                 AM-PAC OT 6 Clicks Daily Activity     Outcome Measure   Help from another person eating meals?: A Lot Help from another person taking care of personal grooming?: A Lot Help from another person toileting, which includes using toliet, bedpan, or urinal?: Total Help from another person bathing (including washing, rinsing, drying)?: A Lot Help from another person to put on and taking off regular upper body clothing?: Total Help from another person to put on and taking off regular lower body clothing?: Total 6 Click Score: 9    End of Session    OT Visit Diagnosis: Other abnormalities of gait and mobility (R26.89);Unsteadiness on feet (R26.81);Other symptoms and signs involving cognitive function;Pain;Cognitive communication deficit (R41.841);Apraxia (R48.2);Muscle weakness (generalized) (M62.81);Feeding difficulties (R63.3);Other symptoms and signs involving the nervous system (R29.898) Pain - Right/Left: Left Pain - part of body: Shoulder   Activity Tolerance Patient tolerated treatment well   Patient Left in bed;with call bell/phone within reach;with bed alarm set;with family/visitor present (tried SCDs but alarm sounding despite readjustment.)   Nurse Communication          Time: 8657-8585 OT Time Calculation (min): 32 min  Charges: OT General Charges $OT Visit: 1 Visit OT Treatments $Self Care/Home Management : 8-22 mins $Therapeutic Activity: 8-22 mins  Connor, OT Acute Rehab Services Office: [phone] 11/07/2023   Connor Dixon 11/07/2023, 2:55 PM

## 2023-11-08 ENCOUNTER — Encounter: Payer: Self-pay | Admitting: Neurology

## 2023-11-08 DIAGNOSIS — M6259 Muscle wasting and atrophy, not elsewhere classified, multiple sites: Secondary | ICD-10-CM | POA: Diagnosis not present

## 2023-11-08 DIAGNOSIS — M25561 Pain in right knee: Secondary | ICD-10-CM | POA: Diagnosis not present

## 2023-11-08 DIAGNOSIS — R339 Retention of urine, unspecified: Secondary | ICD-10-CM | POA: Diagnosis not present

## 2023-11-08 DIAGNOSIS — Z7401 Bed confinement status: Secondary | ICD-10-CM | POA: Diagnosis not present

## 2023-11-08 DIAGNOSIS — Z794 Long term (current) use of insulin: Secondary | ICD-10-CM | POA: Diagnosis not present

## 2023-11-08 DIAGNOSIS — M6281 Muscle weakness (generalized): Secondary | ICD-10-CM | POA: Diagnosis not present

## 2023-11-08 DIAGNOSIS — Z8673 Personal history of transient ischemic attack (TIA), and cerebral infarction without residual deficits: Secondary | ICD-10-CM | POA: Diagnosis not present

## 2023-11-08 DIAGNOSIS — G3184 Mild cognitive impairment, so stated: Secondary | ICD-10-CM | POA: Diagnosis not present

## 2023-11-08 DIAGNOSIS — G9340 Encephalopathy, unspecified: Secondary | ICD-10-CM | POA: Diagnosis not present

## 2023-11-08 DIAGNOSIS — Z9181 History of falling: Secondary | ICD-10-CM | POA: Diagnosis not present

## 2023-11-08 DIAGNOSIS — M170 Bilateral primary osteoarthritis of knee: Secondary | ICD-10-CM | POA: Diagnosis not present

## 2023-11-08 DIAGNOSIS — Z79899 Other long term (current) drug therapy: Secondary | ICD-10-CM | POA: Diagnosis not present

## 2023-11-08 DIAGNOSIS — I130 Hypertensive heart and chronic kidney disease with heart failure and stage 1 through stage 4 chronic kidney disease, or unspecified chronic kidney disease: Secondary | ICD-10-CM | POA: Diagnosis not present

## 2023-11-08 DIAGNOSIS — I1310 Hypertensive heart and chronic kidney disease without heart failure, with stage 1 through stage 4 chronic kidney disease, or unspecified chronic kidney disease: Secondary | ICD-10-CM | POA: Diagnosis not present

## 2023-11-08 DIAGNOSIS — G9349 Other encephalopathy: Secondary | ICD-10-CM | POA: Diagnosis not present

## 2023-11-08 DIAGNOSIS — Z741 Need for assistance with personal care: Secondary | ICD-10-CM | POA: Diagnosis not present

## 2023-11-08 DIAGNOSIS — E1122 Type 2 diabetes mellitus with diabetic chronic kidney disease: Secondary | ICD-10-CM | POA: Diagnosis not present

## 2023-11-08 DIAGNOSIS — I10 Essential (primary) hypertension: Secondary | ICD-10-CM

## 2023-11-08 DIAGNOSIS — E785 Hyperlipidemia, unspecified: Secondary | ICD-10-CM

## 2023-11-08 DIAGNOSIS — Z7901 Long term (current) use of anticoagulants: Secondary | ICD-10-CM | POA: Diagnosis not present

## 2023-11-08 DIAGNOSIS — R41841 Cognitive communication deficit: Secondary | ICD-10-CM | POA: Diagnosis not present

## 2023-11-08 DIAGNOSIS — G9341 Metabolic encephalopathy: Secondary | ICD-10-CM | POA: Diagnosis not present

## 2023-11-08 DIAGNOSIS — R2689 Other abnormalities of gait and mobility: Secondary | ICD-10-CM | POA: Diagnosis not present

## 2023-11-08 DIAGNOSIS — R1312 Dysphagia, oropharyngeal phase: Secondary | ICD-10-CM | POA: Diagnosis not present

## 2023-11-08 DIAGNOSIS — R9431 Abnormal electrocardiogram [ECG] [EKG]: Secondary | ICD-10-CM

## 2023-11-08 DIAGNOSIS — R531 Weakness: Secondary | ICD-10-CM | POA: Diagnosis not present

## 2023-11-08 DIAGNOSIS — I442 Atrioventricular block, complete: Secondary | ICD-10-CM | POA: Diagnosis not present

## 2023-11-08 DIAGNOSIS — N1832 Chronic kidney disease, stage 3b: Secondary | ICD-10-CM | POA: Diagnosis not present

## 2023-11-08 DIAGNOSIS — N179 Acute kidney failure, unspecified: Secondary | ICD-10-CM | POA: Diagnosis not present

## 2023-11-08 DIAGNOSIS — R9082 White matter disease, unspecified: Secondary | ICD-10-CM | POA: Diagnosis not present

## 2023-11-08 DIAGNOSIS — I480 Paroxysmal atrial fibrillation: Secondary | ICD-10-CM | POA: Diagnosis not present

## 2023-11-08 DIAGNOSIS — I4891 Unspecified atrial fibrillation: Secondary | ICD-10-CM | POA: Diagnosis not present

## 2023-11-08 DIAGNOSIS — W19XXXA Unspecified fall, initial encounter: Secondary | ICD-10-CM | POA: Diagnosis not present

## 2023-11-08 DIAGNOSIS — E119 Type 2 diabetes mellitus without complications: Secondary | ICD-10-CM | POA: Diagnosis not present

## 2023-11-08 DIAGNOSIS — G319 Degenerative disease of nervous system, unspecified: Secondary | ICD-10-CM | POA: Diagnosis not present

## 2023-11-08 DIAGNOSIS — M25562 Pain in left knee: Secondary | ICD-10-CM | POA: Diagnosis not present

## 2023-11-08 LAB — COMPREHENSIVE METABOLIC PANEL WITH GFR
ALT: 21 U/L (ref 0–44)
AST: 31 U/L (ref 15–41)
Albumin: 1.8 g/dL — ABNORMAL LOW (ref 3.5–5.0)
Alkaline Phosphatase: 54 U/L (ref 38–126)
Anion gap: 6 (ref 5–15)
BUN: 23 mg/dL (ref 8–23)
CO2: 22 mmol/L (ref 22–32)
Calcium: 8.1 mg/dL — ABNORMAL LOW (ref 8.9–10.3)
Chloride: 107 mmol/L (ref 98–111)
Creatinine, Ser: 1.72 mg/dL — ABNORMAL HIGH (ref 0.61–1.24)
GFR, Estimated: 38 mL/min — ABNORMAL LOW (ref >60)
Glucose, Bld: 126 mg/dL — ABNORMAL HIGH (ref 70–99)
Potassium: 3.7 mmol/L (ref 3.5–5.1)
Sodium: 135 mmol/L (ref 135–145)
Total Bilirubin: 0.5 mg/dL (ref 0.0–1.2)
Total Protein: 5.5 g/dL — ABNORMAL LOW (ref 6.5–8.1)

## 2023-11-08 LAB — CBC
HCT: 33.6 % — ABNORMAL LOW (ref 39.0–52.0)
Hemoglobin: 11.1 g/dL — ABNORMAL LOW (ref 13.0–17.0)
MCH: 29.8 pg (ref 26.0–34.0)
MCHC: 33 g/dL (ref 30.0–36.0)
MCV: 90.1 fL (ref 80.0–100.0)
Platelets: 304 10*3/uL (ref 150–400)
RBC: 3.73 MIL/uL — ABNORMAL LOW (ref 4.22–5.81)
RDW: 14.9 % (ref 11.5–15.5)
WBC: 4 10*3/uL (ref 4.0–10.5)
nRBC: 0 % (ref 0.0–0.2)

## 2023-11-08 LAB — GLUCOSE, CAPILLARY
Glucose-Capillary: 137 mg/dL — ABNORMAL HIGH (ref 70–99)
Glucose-Capillary: 232 mg/dL — ABNORMAL HIGH (ref 70–99)

## 2023-11-08 LAB — MAGNESIUM: Magnesium: 1.8 mg/dL (ref 1.7–2.4)

## 2023-11-08 MED ORDER — HUMULIN 70/30 KWIKPEN (70-30) 100 UNIT/ML ~~LOC~~ SUPN: 15.0000 [IU] | PEN_INJECTOR | Freq: Two times a day (BID) | SUBCUTANEOUS | Status: DC

## 2023-11-08 NOTE — TOC Transition Note (Signed)
 Transition of Care [facility]) - Discharge Note   Patient Details  Name: Connor Dixon MRN: [id number] Date of Birth: 05-Jul-1934  [facility] CM/SW Contact:  Almarie CHRISTELLA Goodie, LCSW Phone Number: 11/08/2023, 12:02 PM   Clinical Narrative:   CSW updated by [facility] that [facility] can get the patient in for an appointment on July 9. Information placed on patient's AVS and son, [name], provided with update. He is in agreement to move forward with discharge to [facility] today. CSW met with [name] to provide information and answer questions. Discharge information sent to [facility], confirmed receipt and they are ready for patient to admit. Transport arranged with [facility] for next available.  Nurse to call report to [phone], Room 804B.    Final next level of care: Skilled Nursing Facility Barriers to Discharge: Barriers Resolved   Patient Goals and CMS Choice            Discharge Placement              Patient chooses bed at: [facility] Patient to be transferred to facility by: [facility] Name of family member notified: [name] Patient and family notified of of transfer: 11/08/23  Discharge Plan and Services Additional resources added to the After Visit Summary for                                       Social Drivers of Health (SDOH) Interventions SDOH Screenings   Food Insecurity: Patient Unable To Answer (10/13/2023)  Housing: Patient Unable To Answer (10/13/2023)  Transportation Needs: Patient Unable To Answer (10/13/2023)  Utilities: Patient Unable To Answer (10/13/2023)  Alcohol  Screen: Low Risk  (04/06/2023)  Depression (PHQ2-9): Low Risk  (09/06/2023)  Financial Resource Strain: Low Risk  (04/06/2023)  Physical Activity: Inactive (04/06/2023)  Social Connections: Unknown (10/13/2023)  Stress: No Stress Concern Present (04/06/2023)  Tobacco Use: Low Risk  (10/10/2023)  Health Literacy: Adequate Health Literacy (04/06/2023)     Readmission  Risk Interventions     No data to display

## 2023-11-08 NOTE — Discharge Summary (Signed)
 Physician Discharge Summary   Patient: Connor Dixon MRN: [id number] DOB: Jul 13, 1934  Admit date:     10/10/2023  Discharge date: 11/08/23  Discharge Physician: Lonni SHAUNNA Dalton   PCP: [name], MD     Recommendations at discharge:  Follow up with [facility] Neurology Dr. Skeet on 11/22/23 at 2:50PM for encephalopathy Please monitor blood glucose three times daily with meals and titrate 70/30 as appropriate     Discharge Diagnoses: Principal Problem:   Acute metabolic encephalopathy Active Problems:   Possible mild cognitive impairment, prior to admission   AKI on chronic kidney disease stage III baseline 1.5-1.6   Hyperlipidemia with target LDL less than 70   Essential hypertension   Sinus node dysfunction (HCC)   Insulin -requiring or dependent type II diabetes mellitus (HCC)   Paroxysmal atrial fibrillation (HCC)   Myocardial injury due to hypertension   Myocardial ischemia and infarction ruled out   Prolonged QT interval   Acute urinary retention     Hospital Course: Connor Dixon is an 89 yo male with PMH chronic diastolic CHF, complete AV block s/p pacemaker, CKD 3a, insulin  dependent diabetes, HTN, HLD who presented after being found slumped over in his chair at home and unresponsive.  He was spoken to on the phone on the evening of 10/09/2023 and upon being found on 10/10/2023 by the cleaner he was noted to be unresponsive slumped over the chair.  At baseline he is considered high functioning and ambulates independently and lives alone.  He was admitted for further workup of altered mentation.    Acute metabolic encephalopathy In the ED the patient underwent a CT head that demonstrated no acute process. A CTA head and neck demonstrated no LVO. There was however evidence of a remote lacunar infarct of the anterior right corona radiata. There was concern for Seizure, Posterior Circulation CVA, and meningitis, admitted on empiric antibiotics. Presenting glucose 176  mg/dL, blood pressure 846/08 mmHg.   MRI was performed and demonstrated no acute intracranial abnormality only chronic small vessel disease and generalized appearing brain volume loss.  EEG unremarkable.   LP performed on 10/13/2023 ruled out meningitis.   Treated with dexamethasone  48 mg daily from 5/28 to 5/30 in setting of empiric meningitis treatment without improvement.  Treated subsequently with empiric IVIG by Neurology for possible autoimmune encephalopathy.  Serum autoimmune panel performed at [facility] was negative for Anti-Hu antibody negative, antineuronal antibody negative,  anti-Yo antibody negative,Amphiphysin Antibody Negative, mGluR1 Antibody Negative, Ma2/Ta Antibody Negative, GABA-B-R Antibody    Negative.     CSF Autoimmune panel sent to [facility] clinic, for results can call [phone], account # [id number].  Last discussion on 6/23 by neurology with a lab, apparently [facility] run out of some reagents and the test results will be delayed for an unknown amount of time   Ammonia 22, ACTH  test negative, Thiamine  107, HIV non reactive, RPR non reactive, ANA negative, VDRL CSF negative, no oligoclonal band seen on CSF, meningitis encephalitis panel negative, CSF culture negative. Blood culture negative, TSH: 1.0 B12 level low normal, supplementation started.    Repeated MRI 6/3 with contrast was negative for acute finding, mention chronic small vessel disease, moderate generalized atrophy and white matter disease bilaterally. Chronic CVA.      Suspected cognitive impairment, POA Patient is described as having some level of decreasing cognition over at least weeks to months prior to admission; more forgetfulness with directions, cooking/prepping meals, finances, and not driving lawn mower well  Acute on  CKD3b Cr peaked at 2.4 in the hospital.  Returned to baseline prior to discharge.   Insulin -requiring or dependent type II diabetes mellitus (HCC) Blood sugars labile. At home  takes 70/30 40 units in AM and 20 units in PM.  Here, was on 14 units glargine and typically ~15 units of SS correction and mealtime insulin  and still had labile blood sugar with several episodes of mild class 1 hypoglycemia  - Recommend discharge on 70/30 15 units BID and titrate at SNF  Essential hypertension Losartan  stopped due to AKI.  Carvedilol  changed to metoprolol .  Amlodipine  added.   Elevated serum osm gap Etiology unclear. Not acidotic. Toxic alcohol  ingestion very unlikely.  Discussed with Hematology, would be abnormal for paraproteinemia to present with purely encephalopathy.  SPEP and FLCs ruled out MM    Myocardial injury likely from hypertension Echo unrmarkable.  Ischemia and infarction ruled out.    Acute urinary retention - resolved Foley discontinued 6/2.  On new Flomax .   Hyperlipidemia with target LDL less than 70 Pravastatin  held at discharge, can be resumed by PCP at later date.   Paroxysmal atrial fibrillation (HCC) Stable on Eliquis  and Lopressor    Sinus node dysfunction (HCC) History of PPM             The [place]  Controlled Substances Registry was reviewed for this patient prior to discharge.  Consultants: Neurology   Disposition: Skilled nursing facility Diet recommendation:  Cardiac and Carb modified diet  DISCHARGE MEDICATION: Allergies as of 11/08/2023       Reactions   Oysters [shellfish Allergy] Hives, Swelling   Citrus Hives   Pecan Extract Hives   Apple Juice Other (See Comments)   Sore throat   Farxiga  [dapagliflozin ] Other (See Comments)   Low blood sugar        Medication List     STOP taking these medications    carvedilol  6.25 MG tablet Commonly known as: COREG    losartan  25 MG tablet Commonly known as: COZAAR    pravastatin  40 MG tablet Commonly known as: PRAVACHOL        TAKE these medications    allopurinol  100 MG tablet Commonly known as: ZYLOPRIM  TAKE 1 TABLET(100 MG) BY MOUTH DAILY What  changed: See the new instructions.   amLODipine  10 MG tablet Commonly known as: NORVASC  Take 1 tablet (10 mg total) by mouth daily.   amoxicillin  500 MG capsule Commonly known as: AMOXIL  TAKE ONE CAPSULE BY MOUTH THREE TIMES DAILY. TAKE ONLY WHEN HAVING DENTAL PROCEDURES What changed: See the new instructions.   apixaban  2.5 MG Tabs tablet Commonly known as: Eliquis  Take 1 tablet (2.5 mg total) by mouth 2 (two) times daily.   calcitRIOL  0.25 MCG capsule Commonly known as: ROCALTROL  TAKE 1 CAPSULE BY MOUTH DAILY RETURN IN ABOUT 6 MONTHS(AROUND 02/04/2023) What changed: See the new instructions.   cyanocobalamin  1000 MCG tablet Take 1 tablet (1,000 mcg total) by mouth daily.   Dexcom G7 Sensor Misc 1 Act by Does not apply route daily. What changed:  how much to take how to take this when to take this additional instructions   feeding supplement (GLUCERNA SHAKE) Liqd Take 237 mLs by mouth daily.   ferrous sulfate  325 (65 FE) MG tablet Take 1 tablet (325 mg total) by mouth daily with breakfast.   folic acid  1 MG tablet Commonly known as: FOLVITE  Take 1 tablet (1 mg total) by mouth daily.   furosemide  40 MG tablet Commonly known as: LASIX  Take 1 tablet (  40 mg total) by mouth daily as needed for fluid or edema. What changed:  how much to take how to take this when to take this reasons to take this additional instructions   Gvoke HypoPen  2-Pack 1 MG/0.2ML Soaj Generic drug: Glucagon  Inject 1 Act into the skin daily as needed. What changed:  how to take this when to take this reasons to take this   HumuLIN  70/30 KwikPen (70-30) 100 UNIT/ML KwikPen Generic drug: insulin  isophane & regular human KwikPen Inject 15 Units into the skin in the morning and at bedtime. 40 units in AM and 25 units in PM What changed:  how much to take how to take this when to take this   levocetirizine 5 MG tablet Commonly known as: XYZAL  Take 1 tablet (5 mg total) by mouth every  evening.   loratadine 10 MG tablet Commonly known as: CLARITIN Take 10 mg by mouth daily as needed for allergies.   metoprolol  tartrate 25 MG tablet Commonly known as: LOPRESSOR  Take 0.5 tablets (12.5 mg total) by mouth 2 (two) times daily.   tamsulosin  0.4 MG Caps capsule Commonly known as: FLOMAX  Take 1 capsule (0.4 mg total) by mouth daily.        Contact information for follow-up providers     [facility] Follow up on 11/22/2023.   Why: Dr Skeet is the MD. your appointment is at 2:50 pm. please arrive early Contact information: [address] [place], [address] [place]  [address] [phone]        [name], MD Follow up.   Specialty: Internal Medicine Contact information: [address] [place] [address] [phone]              Contact information for after-discharge care     Destination     [facility] .   Service: Skilled Nursing Contact information: [address] [place]  [address] [phone]                     Discharge Instructions     Ambulatory referral to Neurology   Complete by: As directed    An appointment is requested in approximately: 2 weeks. Patient admitted with increased confusion of unknown etiology. Seen by neurology in the hospital. Extensive workup negative here, a CSF Autoimmune panel was sent to [facility] and is pending at the time of discharge. Estimated time to result unclear as [facility] ran out of certain reagents, may be few weeks until test is back. Thank you       Discharge Exam: Filed Weights   10/10/23 1705  Weight: 91 kg    General: Pt is alert, awake, not in acute distress, sitting up in bed, appears subdued.  Cardiovascular: RRR, nl S1-S2, no murmurs appreciated.   No LE edema.   Respiratory: Normal respiratory rate and rhythm.  CTAB without rales or wheezes. Abdominal: Abdomen soft and non-tender.  No distension or HSM.    Neuro/Psych: Judgment and insight appear impaired.  Severe generalized weakness, but symmetric.   Condition at discharge: fair  The results of significant diagnostics from this hospitalization (including imaging, microbiology, ancillary and laboratory) are listed below for reference.   Imaging Studies: DG CHEST PORT 1 VIEW Result Date: 10/31/2023 CLINICAL DATA:  Fever EXAM: PORTABLE CHEST 1 VIEW COMPARISON:  10/27/2023 FINDINGS: Low volume film. Cardiopericardial silhouette is at upper limits of normal for size. There is pulmonary vascular congestion without overt pulmonary edema. No dense  consolidative airspace disease or substantial pleural effusion. Permanent pacemaker again noted. IMPRESSION: Low volume film with pulmonary vascular congestion. Electronically Signed   By: Camellia Candle M.D.   On: 10/31/2023 08:16   US  RENAL Result Date: 10/28/2023 CLINICAL DATA:  10060 Dysuria [id number] EXAM: RENAL / URINARY TRACT ULTRASOUND COMPLETE COMPARISON:  Oct 14, 2023 FINDINGS: Evaluation is limited secondary to noncooperation with exam. Right Kidney: Renal measurements: 9.6 x 5.1 x 5.5 cm = volume: 141 mL. Echogenicity is mildly increased. No definitive mass or hydronephrosis visualized. Left Kidney: Renal measurements: 11.0 x 5.6 x 4.7 cm = volume: 151 mL. Echogenicity is mildly increased. No definitive hydronephrosis visualized. There is a near anechoic mass which measures 18 x 17 x 15 mm. Portions are suboptimally assessed secondary to shadowing bowel gas. Bladder: Circumferential wall prominence. Other: None. IMPRESSION: 1. No hydronephrosis. 2. Mildly increased echogenicity of the bilateral kidneys as can be seen in medical renal disease. 3. Circumferential wall prominence of the bladder which can be seen in setting of cystitis or chronic outlet obstruction. Recommend correlation with urinalysis. 4. Favored mildly complicated LEFT renal cyst. Recommend follow-up ultrasound in 6 months to assess for  stability. Electronically Signed   By: Corean Salter M.D.   On: 10/28/2023 13:45   DG CHEST PORT 1 VIEW Result Date: 10/27/2023 EXAM: 1 VIEW(S) XRAY OF THE CHEST 10/27/2023 07:23:00 PM COMPARISON: Comparison CT chest dated 10/14/2023. CLINICAL HISTORY: Cough, AMS. FINDINGS: LUNGS AND PLEURA: Low lung volumes. No focal pulmonary opacity. No pulmonary edema. No pleural effusion. No pneumothorax. HEART AND MEDIASTINUM: No acute abnormality of the cardiac and mediastinal silhouettes. BONES AND SOFT TISSUES: No acute osseous abnormality. IMPRESSION: 1. Low lung volumes. 2. No acute cardiopulmonary abnormality. Electronically signed by: Pinkie Pebbles MD 10/27/2023 07:53 PM EDT RP Workstation: [id number]   Overnight EEG with video Result Date: 10/20/2023 Shelton Arlin KIDD, MD     10/21/2023  7:27 AM Patient Name: ISIDRO MONKS MRN: [id number] Epilepsy Attending: Arlin KIDD Shelton Referring Physician/Provider: Waddell Karna LABOR, NP Duration: 10/19/2023 1036 to 10/20/2023 1036 Patient history: 89 y.o. male who presented with severe encephalopathy which is currently of unclear etiology. EEG to evaluate for seizure Level of alertness: Awake AEDs during EEG study: None Technical aspects: This EEG study was done with scalp electrodes positioned according to the 10-20 International system of electrode placement. Electrical activity was reviewed with band pass filter of 1-70Hz , sensitivity of 7 uV/mm, display speed of 82mm/sec with a 60Hz  notched filter applied as appropriate. EEG data were recorded continuously and digitally stored.  Video monitoring was available and reviewed as appropriate. Description: EEG showed continuous generalized 3 to 6 Hz theta-delta slowing. Hyperventilation and photic stimulation were not performed.   ABNORMALITY - Continuous slow, generalized IMPRESSION: This study is suggestive of moderate diffuse encephalopathy. No seizures or epileptiform discharges were seen throughout the recording. Priyanka  O Yadav   MR BRAIN W WO CONTRAST Result Date: 10/18/2023 EXAM: MRI BRAIN WITH AND WITHOUT CONTRAST 10/17/2023 12:25:17 PM TECHNIQUE: Multiplanar multisequence MRI of the head/brain was performed with and without the administration of intravenous contrast. COMPARISON: MR head without 10/12/2023. CLINICAL HISTORY: AMS, c/f autoimmune encephalitis. FINDINGS: BRAIN AND VENTRICLES: No acute infarct. No acute intracranial hemorrhage. No mass effect or midline shift. No hydrocephalus. The sella is unremarkable. Normal flow voids. No mass or abnormal enhancement. Moderate atrophy and white matter disease are again noted bilaterally. Remote white matter infarcts are present in the right corona radiata. A remote lacunar infarct is  again noted in the right lentiform nucleus. ORBITS: No acute abnormality. SINUSES: No acute abnormality. BONES AND SOFT TISSUES: Normal bone marrow signal and enhancement. No acute soft tissue abnormality. IMPRESSION: 1. No acute intracranial abnormality. 2. Moderate atrophy and white matter disease bilaterally. 3. 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 Electronically signed by: Lonni Necessary MD 10/18/2023 04:07 AM EDT RP Workstation: [id number]   CT CHEST ABDOMEN PELVIS WO CONTRAST Result Date: 10/14/2023 CLINICAL DATA:  Occult malignancy * Tracking Code: BO * EXAM: CT CHEST, ABDOMEN AND PELVIS WITHOUT CONTRAST TECHNIQUE: Multidetector CT imaging of the chest, abdomen and pelvis was performed following the standard protocol without IV contrast. RADIATION DOSE REDUCTION: This exam was performed according to the departmental dose-optimization program which includes automated exposure control, adjustment of the mA and/or kV according to patient size and/or use of iterative reconstruction technique. COMPARISON:  None Available. FINDINGS: CT CHEST FINDINGS Cardiovascular: Left chest multilead pacer. Normal heart  size. Three-vessel coronary artery calcifications. No pericardial effusion. Mediastinum/Nodes: No enlarged mediastinal, hilar, or axillary lymph nodes. Thyroid  gland, trachea, and esophagus demonstrate no significant findings. Lungs/Pleura: Bibasilar scarring or atelectasis. Elevation of the right hemidiaphragm no pleural effusion or pneumothorax. Musculoskeletal: No chest wall abnormality. No acute osseous findings. CT ABDOMEN PELVIS FINDINGS Hepatobiliary: No solid liver abnormality is seen. Very contracted gallbladder or gallbladder remnant containing a small gallstone (series 6, image 51). No biliary ductal dilatation Pancreas: Unremarkable. No pancreatic ductal dilatation or surrounding inflammatory changes. Spleen: Normal in size without significant abnormality. Adrenals/Urinary Tract: Adrenal glands are unremarkable. Kidneys are normal, without renal calculi, solid lesion, or hydronephrosis. Visualization of the bladder very limited by dense metallic streak artifact from adjacent hip arthroplasty. Diffuse wall thickening of the bladder with Foley catheter. Stomach/Bowel: Stomach is within normal limits. Descending duodenal diverticulum. Appendix appears normal. No evidence of bowel wall thickening, distention, or inflammatory changes. Vascular/Lymphatic: No significant vascular findings are present. No enlarged abdominal or pelvic lymph nodes. Reproductive: Visualization of the low pelvis very limited by dense metallic streak artifact from adjacent hip arthroplasty. No obvious abnormality of the prostate. Other: No abdominal wall hernia or abnormality. No ascites. Musculoskeletal: No acute osseous findings. IMPRESSION: 1. No noncontrast CT evidence of malignancy in the chest, abdomen, or pelvis. 2. Visualization of the low pelvis very limited by dense metallic streak artifact from adjacent hip arthroplasty. 3. Diffuse wall thickening of the bladder with Foley catheter, presumably secondary to chronic outlet  obstruction. 4. Very contracted gallbladder or gallbladder remnant containing a small gallstone. No biliary ductal dilatation. 5. Coronary artery disease. Electronically Signed   By: Marolyn JONETTA Jaksch M.D.   On: 10/14/2023 16:19   DG FL GUIDED LUMBAR PUNCTURE Result Date: 10/13/2023 CLINICAL DATA:  89 year old male who presented with severe encephalopathy of unclear etiology. Request for lumbar puncture. EXAM: LUMBAR PUNCTURE UNDER FLUOROSCOPY PROCEDURE: The risks, benefits, and alternatives to fluoroscopically guided lumbar puncture with Jack Bolio (patient's son) by telephone due to the patient's altered mental status. Mr. Jafari Mckillop understood and elected for the patient to undergo the procedure. Standard time-out was employed. An appropriate skin entry site was determined fluoroscopically. Operator donned sterile gloves and mask. Skin site was marked, then prepped with Betadine, draped in usual sterile fashion, and infiltrated locally with 1% lidocaine . A 20 gauge spinal needle advanced to the thecal sac at L4-5 from a left interlaminar approach. Imaging was reassuring of appropriate needle placement, but no spontaneous CSF  was returned. A second attempt was made at the level of L3-4 from a left interlaminar approach. Again, there was no spontaneous CSF return. Dr. Davina Salvage was called into the room for additional assistance. Lumbar puncture was performed at the level of L2-3 with spontaneous return of clear, colorless CSF. Opening pressure was not assessed due to patient agitation and movement. 14 ml CSF were collected and divided among 4 sterile vials for the requested laboratory studies. The needle was then removed. The patient tolerated the procedure well and there were no observed complications. FLUOROSCOPY: Radiation Exposure Index (as provided by the fluoroscopic device): 43.2 mGy Kerma IMPRESSION: Technically successful lumbar puncture under fluoroscopy. This exam was performed by Sherrilee Bal, PA-C and Dr. Davina Salvage. Electronically Signed   By: Ryan Salvage M.D.   On: 10/13/2023 14:18   MR BRAIN WO CONTRAST Result Date: 10/12/2023 CLINICAL DATA:  89 year old male with meningitis. EXAM: MRI HEAD WITHOUT CONTRAST TECHNIQUE: Multiplanar, multiecho pulse sequences of the brain and surrounding structures were obtained without intravenous contrast. COMPARISON:  Head CTs yesterday, 10/10/2023. FINDINGS: Brain: No restricted diffusion to suggest acute infarction. No midline shift, mass effect, evidence of mass lesion, ventriculomegaly, extra-axial collection or acute intracranial hemorrhage. Cervicomedullary junction and pituitary are within normal limits. No evidence of ventricular debris. No subarachnoid space signal abnormality is identified. Patchy and confluent bilateral periventricular white matter T2 and FLAIR hyperintensity, nonspecific but probably chronic small vessel disease related. Small chronic lacunar infarct in the right caudate. No cortical encephalomalacia or chronic cerebral blood products identified (SWI). Fairly generalized appearing cerebral volume loss. Vascular: Major intracranial vascular flow voids are preserved. Skull and upper cervical spine: Normal for age visible cervical spine. Visualized bone marrow signal is within normal limits. Sinuses/Orbits: Postoperative changes to both globes. Trace paranasal sinus mucosal thickening. Other: Mastoids are clear. Grossly normal visible internal auditory structures. Negative visible scalp and face. IMPRESSION: 1.  No acute intracranial abnormality by noncontrast MRI. 2. Signal changes compatible with chronic small vessel disease. Generalized appearing brain volume loss. Electronically Signed   By: VEAR Hurst M.D.   On: 10/12/2023 09:26   CT HEAD WO CONTRAST ( ) Result Date: 10/11/2023 CLINICAL DATA:  Meningitis EXAM: CT HEAD WITHOUT CONTRAST TECHNIQUE: Contiguous axial images were obtained from the base of the skull  through the vertex without intravenous contrast. RADIATION DOSE REDUCTION: This exam was performed according to the departmental dose-optimization program which includes automated exposure control, adjustment of the mA and/or kV according to patient size and/or use of iterative reconstruction technique. COMPARISON:  10/10/2023 FINDINGS: Brain: There is no mass, hemorrhage or extra-axial collection. There is generalized atrophy without lobar predilection. Hypodensity of the white matter is most commonly associated with chronic microvascular disease. Vascular: Atherosclerotic calcification of the internal carotid arteries at the skull base. No abnormal hyperdensity of the major intracranial arteries or dural venous sinuses. Skull: The visualized skull base, calvarium and extracranial soft tissues are normal. Sinuses/Orbits: No fluid levels or advanced mucosal thickening of the visualized paranasal sinuses. No mastoid or middle ear effusion. Normal orbits. Other: None. IMPRESSION: 1. No acute intracranial abnormality. 2. Generalized atrophy and findings of chronic microvascular disease. Electronically Signed   By: Franky Stanford M.D.   On: 10/11/2023 22:38   ECHOCARDIOGRAM COMPLETE Result Date: 10/11/2023    ECHOCARDIOGRAM REPORT   Patient Name:   COLETON WOON Date of Exam: 10/11/2023 Medical Rec #:  [id number]      Height:       68.0  in Accession #:    [id number]     Weight:       200.6 lb Date of Birth:  1934/11/03       BSA:          2.046 m Patient Age:    88 years       BP:           156/92 mmHg Patient Gender: M              HR:           87 bpm. Exam Location:  Inpatient Procedure: 2D Echo, Cardiac Doppler and Color Doppler (Both Spectral and Color            Flow Doppler were utilized during procedure). Indications:    Stroke  History:        Patient has prior history of Echocardiogram examinations, most                 recent 08/07/2015. Risk Factors:Hypertension.  Sonographer:    Philomena Daring Referring Phys:  ANASTASSIA DOUTOVA  Sonographer Comments: Technically challenging study due to limited acoustic windows, Technically difficult study due to poor echo windows, no apical window, no subcostal window and patient is obese. Image acquisition challenging due to patient body habitus. The Patient is undergoing EEG monitoring IMPRESSIONS  1. Left ventricular ejection fraction, by estimation, is 60 to 65%. The left ventricle has normal function. The left ventricle has no regional wall motion abnormalities. Left ventricular diastolic parameters are indeterminate.  2. Right ventricular systolic function is normal. The right ventricular size is normal.  3. The mitral valve is normal in structure. No evidence of mitral valve regurgitation. No evidence of mitral stenosis. Moderate mitral annular calcification.  4. The aortic valve is normal in structure. Aortic valve regurgitation is not visualized. No aortic stenosis is present.  5. The inferior vena cava is normal in size with greater than 50% respiratory variability, suggesting right atrial pressure of 3 mmHg. FINDINGS  Left Ventricle: Left ventricular ejection fraction, by estimation, is 60 to 65%. The left ventricle has normal function. The left ventricle has no regional wall motion abnormalities. The left ventricular internal cavity size was normal in size. There is  no left ventricular hypertrophy. Left ventricular diastolic parameters are indeterminate. Right Ventricle: The right ventricular size is normal. No increase in right ventricular wall thickness. Right ventricular systolic function is normal. Left Atrium: Left atrial size was normal in size. Right Atrium: Right atrial size was normal in size. Pericardium: There is no evidence of pericardial effusion. Mitral Valve: The mitral valve is normal in structure. Moderate mitral annular calcification. No evidence of mitral valve regurgitation. No evidence of mitral valve stenosis. Tricuspid Valve: The tricuspid valve is  normal in structure. Tricuspid valve regurgitation is not demonstrated. No evidence of tricuspid stenosis. Aortic Valve: The aortic valve is normal in structure. Aortic valve regurgitation is not visualized. No aortic stenosis is present. Pulmonic Valve: The pulmonic valve was normal in structure. Pulmonic valve regurgitation is not visualized. No evidence of pulmonic stenosis. Aorta: The aortic root is normal in size and structure. Venous: The inferior vena cava is normal in size with greater than 50% respiratory variability, suggesting right atrial pressure of 3 mmHg. IAS/Shunts: No atrial level shunt detected by color flow Doppler.  LEFT VENTRICLE PLAX 2D LVIDd:         4.90 cm LVIDs:         3.00 cm LV PW:  0.80 cm LV IVS:        1.00 cm LVOT diam:     1.80 cm LVOT Area:     2.54 cm  IVC IVC diam: 1.60 cm LEFT ATRIUM         Index LA diam:    3.80 cm 1.86 cm/m   AORTA Ao Root diam: 2.80 cm  SHUNTS Systemic Diam: 1.80 cm Annabella Scarce MD Electronically signed by Annabella Scarce MD Signature Date/Time: 10/11/2023/2:58:08 PM    Final    Overnight EEG with video Result Date: 10/11/2023 Shelton Arlin KIDD, MD     10/12/2023 10:03 AM Patient Name: ORBY TANGEN MRN: [id number] Epilepsy Attending: Arlin KIDD Shelton Referring Physician/Provider: Michaela Aisha SQUIBB, MD Duration: 10/10/2023 2111 to 10/11/2023 1722 Patient history: 89 y.o. male  hx of PAF on Eliquis , CHF, CHB s/p PPM, HTN, HLD, DM type I, CKD, obesity who presents from home via EMS after being found by family in the chair unresponsive, in urine, slumped to the right. EEG to evaluate for seizure  Level of alertness: Awake, asleep AEDs during EEG study: None Technical aspects: This EEG study was done with scalp electrodes positioned according to the 10-20 International system of electrode placement. Electrical activity was reviewed with band pass filter of 1-70Hz , sensitivity of 7 uV/mm, display speed of 58mm/sec with a 60Hz  notched filter  applied as appropriate. EEG data were recorded continuously and digitally stored.  Video monitoring was available and reviewed as appropriate. Description: No clear posterior dominant rhythm was seen. Sleep was characterized by vertex waves, sleep spindles (12 to 14 Hz), maximal frontocentral region. EEG showed continuous generalized 3 to 6 Hz theta-delta slowing. Hyperventilation and photic stimulation were not performed.   ABNORMALITY - Continuous slow, generalized IMPRESSION: This study is suggestive of moderate diffuse encephalopathy. No seizures or epileptiform discharges were seen throughout the recording. Arlin KIDD Shelton   DG Chest Port 1 View Result Date: 10/10/2023 CLINICAL DATA:  Possible sepsis EXAM: PORTABLE CHEST 1 VIEW COMPARISON:  05/08/2023 FINDINGS: The heart size and mediastinal contours are within normal limits. Both lungs are clear. The visualized skeletal structures are unremarkable. Left-sided pacing device as before. IMPRESSION: No active disease. Electronically Signed   By: Luke Bun M.D.   On: 10/10/2023 19:00   CT ANGIO HEAD NECK W WO CM Result Date: 10/10/2023 EXAM: CTA Head and Neck with Intravenous Contrast. CLINICAL HISTORY: Neuro deficit, acute, stroke suspected. AMS. Possible stroke. Facial droop. Patient was found unresponsive. TECHNIQUE: Axial CTA images of the head and neck performed with intravenous contrast. Two-dimensional MIP and/or three-dimensional MIP and volume rendered reformations were performed. Note: Per PQRS, the description of internal carotid artery percent stenosis, including 0 percent or normal exam, is based on [place] American Symptomatic Carotid Endarterectomy Trial (NASCET) criteria. Dose reduction technique was used including one or more of the following: automated exposure control, adjustment of mA and kV according to patient size, and/or iterative reconstruction. CONTRAST: 60 mL iohexol  (OMNIPAQUE ) 350 MG/ML injection. COMPARISON: None provided.  FINDINGS: CTA NECK: COMMON CAROTID ARTERIES: No significant stenosis. No dissection or occlusion. INTERNAL CAROTID ARTERIES: No significant stenosis. No dissection or occlusion. VERTEBRAL ARTERIES: No significant stenosis. No dissection or occlusion. CTA HEAD: ANTERIOR CEREBRAL ARTERIES: Atherosclerotic changes are present within the cavernous internal carotid arteries bilaterally without significant stenosis through the ICA termini. No significant stenosis. No occlusion. No aneurysm. MIDDLE CEREBRAL ARTERIES: No significant stenosis. No occlusion. No aneurysm. POSTERIOR CEREBRAL ARTERIES: No significant stenosis. No occlusion. No aneurysm. BASILAR  ARTERY: No significant stenosis. No occlusion. No aneurysm. OTHER: SOFT TISSUES: No acute finding. No masses or lymphadenopathy. BONES: No acute osseous abnormality. IMPRESSION: 1. No large vessel occlusion 2. No evidence of significant stenosis, aneurysmal dilatation, or dissection involving the arteries of the head and neck. 3. Atherosclerotic changes within the cavernous internal carotid arteries bilaterally without significant stenosis through the ICA termini. The pertinent results were texted to Dr. Michaela via the [facility] system at 5:51 PM. Electronically signed by: Lonni Necessary MD 10/10/2023 06:03 PM EDT RP Workstation: [id number]   CT HEAD WO CONTRAST Result Date: 10/10/2023 EXAM: CT HEAD WITHOUT 10/10/2023 05:17:00 PM TECHNIQUE: CT of the head was performed without the administration of intravenous contrast. Automated exposure control, iterative reconstruction, and/or weight based adjustment of the mA/kV was utilized to reduce the radiation dose to as low as reasonably achievable. COMPARISON: None available. CLINICAL HISTORY: Right-sided facial droop. The patient is unresponsive. Neuro deficit, acute, stroke suspected. FINDINGS: BRAIN AND VENTRICLES: There is no acute intracranial hemorrhage, mass effect or midline shift. No abnormal extra-axial fluid  collection. The gray-white differentiation is maintained without evidence of an acute infarct. There is no evidence of hydrocephalus. Moderate atrophy and diffuse periventricular white matter disease are present bilaterally. A remote lacunar infarct is present in the anterior right corona radiata. Atherosclerotic calcifications are present in the cavernous carotid arteries bilaterally and at the dural margin of both vertebral arteries. No hyperdense vessel is present. ASPECTS: Ganglionic level: 7/7 Supraganglionic level: 3/3 Total: 10/10 ORBITS: Bilateral lens replacements are noted. The globes and orbits are otherwise within normal limits. SINUSES: The visualized paranasal sinuses and mastoid air cells demonstrate no acute abnormality. SOFT TISSUES AND SKULL: No acute abnormality of the visualized skull or soft tissues. IMPRESSION: 1. No acute intracranial abnormality.  ASPECTS 10/10 2. Moderate atrophy and diffuse periventricular white matter disease bilaterally. 3. Remote lacunar infarct in the anterior right corona radiata. The pertinent results were texted to Dr. Michaela via the [facility] system at 5:51 PM. Electronically signed by: Lonni Necessary MD 10/10/2023 05:52 PM EDT RP Workstation: [id number]    Microbiology: Results for orders placed or performed during the hospital encounter of 10/10/23  Blood Culture (routine x 2)     Status: None   Collection Time: 10/10/23  6:50 PM   Specimen: BLOOD RIGHT HAND  Result Value Ref Range Status   Specimen Description BLOOD RIGHT HAND  Final   Special Requests   Final    BOTTLES DRAWN AEROBIC ONLY Blood Culture results may not be optimal due to an inadequate volume of blood received in culture bottles   Culture   Final    NO GROWTH 5 DAYS Performed at [facility], [address] [place], [place] [address]    Report Status 10/15/2023 FINAL  Final  Blood Culture (routine x 2)     Status: None   Collection Time: 10/10/23  9:05 PM   Specimen:  BLOOD RIGHT HAND  Result Value Ref Range Status   Specimen Description BLOOD RIGHT HAND  Final   Special Requests   Final    BOTTLES DRAWN AEROBIC ONLY Blood Culture results may not be optimal due to an inadequate volume of blood received in culture bottles   Culture   Final    NO GROWTH 5 DAYS Performed at [facility], [address] [place], [place] [address]    Report Status 10/15/2023 FINAL  Final  CSF culture w Gram Stain     Status:  None   Collection Time: 10/13/23  9:07 AM   Specimen: CSF; Cerebrospinal Fluid  Result Value Ref Range Status   Specimen Description CSF  Final   Special Requests LP  Final   Gram Stain   Final    WBC PRESENT, PREDOMINANTLY MONONUCLEAR NO ORGANISMS SEEN CYTOSPIN SMEAR    Culture   Final    NO GROWTH 3 DAYS Performed at [facility], [address] [place], [place] [address]    Report Status 10/16/2023 FINAL  Final  Resp panel by RT-PCR (RSV, Flu A&B, Covid) Anterior Nasal Swab     Status: None   Collection Time: 10/13/23 11:35 AM   Specimen: Anterior Nasal Swab  Result Value Ref Range Status   SARS Coronavirus 2 by RT PCR NEGATIVE NEGATIVE Final   Influenza A by PCR NEGATIVE NEGATIVE Final   Influenza B by PCR NEGATIVE NEGATIVE Final    Comment: (NOTE) The Xpert Xpress SARS-CoV-2/FLU/RSV plus assay is intended as an aid in the diagnosis of influenza from Nasopharyngeal swab specimens and should not be used as a sole basis for treatment. Nasal washings and aspirates are unacceptable for Xpert Xpress SARS-CoV-2/FLU/RSV testing.  Fact Sheet for Patients: BloggerCourse.com  Fact Sheet for Healthcare Providers: SeriousBroker.it  This test is not yet approved or cleared by the [place]  FDA and has been authorized for detection and/or diagnosis of SARS-CoV-2 by FDA under an Emergency Use Authorization (EUA). This EUA will remain in effect (meaning this test can be used)  for the duration of the COVID-19 declaration under Section 564(b)(1) of the Act, 21 U.S.C. section 360bbb-3(b)(1), unless the authorization is terminated or revoked.     Resp Syncytial Virus by PCR NEGATIVE NEGATIVE Final    Comment: (NOTE) Fact Sheet for Patients: BloggerCourse.com  Fact Sheet for Healthcare Providers: SeriousBroker.it  This test is not yet approved or cleared by the [place]  FDA and has been authorized for detection and/or diagnosis of SARS-CoV-2 by FDA under an Emergency Use Authorization (EUA). This EUA will remain in effect (meaning this test can be used) for the duration of the COVID-19 declaration under Section 564(b)(1) of the Act, 21 U.S.C. section 360bbb-3(b)(1), unless the authorization is terminated or revoked.  Performed at [facility], [address] [place], [place] [address]   Respiratory (~20 pathogens) panel by PCR     Status: None   Collection Time: 10/13/23 11:35 AM   Specimen: Nasopharyngeal Swab; Respiratory  Result Value Ref Range Status   Adenovirus NOT DETECTED NOT DETECTED Final   Coronavirus 229E NOT DETECTED NOT DETECTED Final    Comment: (NOTE) The Coronavirus on the Respiratory Panel, DOES NOT test for the novel  Coronavirus (2019 nCoV)    Coronavirus HKU1 NOT DETECTED NOT DETECTED Final   Coronavirus NL63 NOT DETECTED NOT DETECTED Final   Coronavirus OC43 NOT DETECTED NOT DETECTED Final   Metapneumovirus NOT DETECTED NOT DETECTED Final   Rhinovirus / Enterovirus NOT DETECTED NOT DETECTED Final   Influenza A NOT DETECTED NOT DETECTED Final   Influenza B NOT DETECTED NOT DETECTED Final   Parainfluenza Virus 1 NOT DETECTED NOT DETECTED Final   Parainfluenza Virus 2 NOT DETECTED NOT DETECTED Final   Parainfluenza Virus 3 NOT DETECTED NOT DETECTED Final   Parainfluenza Virus 4 NOT DETECTED NOT DETECTED Final   Respiratory Syncytial Virus NOT DETECTED NOT DETECTED  Final   Bordetella pertussis NOT DETECTED NOT DETECTED Final   Bordetella Parapertussis NOT DETECTED NOT DETECTED Final  Chlamydophila pneumoniae NOT DETECTED NOT DETECTED Final   Mycoplasma pneumoniae NOT DETECTED NOT DETECTED Final    Comment: Performed at [facility], [address] [place], [place] [address]  Urine Culture (for pregnant, neutropenic or urologic patients or patients with an indwelling urinary catheter)     Status: Abnormal   Collection Time: 10/28/23  7:04 AM   Specimen: Urine, Clean Catch  Result Value Ref Range Status   Specimen Description URINE, CLEAN CATCH  Final   Special Requests NONE  Final   Culture (A)  Final    <10,000 COLONIES/mL INSIGNIFICANT GROWTH Performed at [facility], [address] [place], [place] [address]    Report Status 10/29/2023 FINAL  Final  MRSA Next Gen by PCR, Nasal     Status: Abnormal   Collection Time: 10/31/23  7:45 AM   Specimen: Nasal Mucosa; Nasal Swab  Result Value Ref Range Status   MRSA by PCR Next Gen DETECTED (A) NOT DETECTED Final    Comment: RESULT CALLED TO, READ BACK BY AND VERIFIED WITH: RN MAIRE MARUS [id number] AT 940 AM BY CM (NOTE) The GeneXpert MRSA Assay (FDA approved for NASAL specimens only), is one component of a comprehensive MRSA colonization surveillance program. It is not intended to diagnose MRSA infection nor to guide or monitor treatment for MRSA infections. Test performance is not FDA approved in patients less than 98 years old. Performed at [facility], [address] [place], [place] [address]   Culture, blood (Routine X 2) w Reflex to ID Panel     Status: None   Collection Time: 10/31/23  8:32 AM   Specimen: BLOOD LEFT ARM  Result Value Ref Range Status   Specimen Description BLOOD LEFT ARM  Final   Special Requests   Final    BOTTLES DRAWN AEROBIC ONLY Blood Culture results may not be optimal due to an inadequate volume of blood received in culture bottles    Culture   Final    NO GROWTH 5 DAYS Performed at [facility], [address], [place], [place] [address]    Report Status 11/05/2023 FINAL  Final  Culture, blood (Routine X 2) w Reflex to ID Panel     Status: None   Collection Time: 10/31/23  8:33 AM   Specimen: BLOOD LEFT HAND  Result Value Ref Range Status   Specimen Description BLOOD LEFT HAND  Final   Special Requests   Final    BOTTLES DRAWN AEROBIC ONLY Blood Culture results may not be optimal due to an inadequate volume of blood received in culture bottles   Culture   Final    NO GROWTH 5 DAYS Performed at [facility], [address] [place], [place] [address]    Report Status 11/05/2023 FINAL  Final    Labs: CBC: Recent Labs  Lab 11/02/23 0429 11/04/23 0703 11/06/23 0447 11/08/23 0427  WBC 3.8* 2.9* 3.3* 4.0  NEUTROABS 1.6*  --  1.1*  --   HGB 11.2* 11.9* 10.9* 11.1*  HCT 32.9* 36.0* 32.8* 33.6*  MCV 89.4 90.9 91.4 90.1  PLT 227 261 296 304   Basic Metabolic Panel: Recent Labs  Lab 11/02/23 0429 11/04/23 0703 11/06/23 0447 11/08/23 0427  NA 134* 139 138 135  K 3.7 3.8 4.0 3.7  CL 104 107 109 107  CO2 22 25 24 22   GLUCOSE 95 174* 100* 126*  BUN 23 21 20 23   CREATININE 1.81* 1.68*  1.78* 1.72*  CALCIUM 8.2* 8.2* 7.6* 8.1*  MG 1.8 1.9 2.0 1.8   Liver Function Tests: Recent Labs  Lab 11/02/23 0429 11/04/23 0703 11/06/23 0447 11/08/23 0427  AST 36 50* 39 31  ALT 25 33 27 21  ALKPHOS 56 57 55 54  BILITOT 0.6 0.5 0.6 0.5  PROT 5.9* 5.8* 5.5* 5.5*  ALBUMIN 2.0* 1.9* 1.8* 1.8*   CBG: Recent Labs  Lab 11/07/23 0624 11/07/23 1118 11/07/23 1723 11/07/23 2207 11/08/23 0633  GLUCAP 159* 158* 148* 135* 137*    Discharge time spent: approximately 45 minutes spent on discharge counseling, evaluation of patient on day of discharge, and coordination of discharge planning with nursing, social work, pharmacy and case management  Signed: Lonni SHAUNNA Dalton, MD [facility] 11/08/2023

## 2023-11-08 NOTE — TOC Progression Note (Signed)
 Transition of Care [facility] - Progression Note    Patient Details  Name: Connor Dixon MRN: [id number] Date of Birth: 1934-07-25  Transition of [facility]) CM/SW Contact  Almarie CHRISTELLA Goodie, [place] Phone Number: 11/08/2023, 10:08 AM  Clinical Narrative:   CSW following for SNF placement. Per MD, patient can be discharged to SNF if outpatient follow up is arranged and scheduled. CSW spoke with son, [name], to confirm. RNCM attempting to arrange appointment with outpatient neurology. CSW contacted [facility] to ensure bed is still available, they can accept patient. CSW requested CMA initiate insurance authorization, and authorization was approved. Patient can discharge once outpatient neurology is arranged. CSW to follow.    Expected Discharge Plan: Skilled Nursing Facility Barriers to Discharge: Insurance Authorization  Expected Discharge Plan and Services                                               Social Determinants of Health (SDOH) Interventions SDOH Screenings   Food Insecurity: Patient Unable To Answer (10/13/2023)  Housing: Patient Unable To Answer (10/13/2023)  Transportation Needs: Patient Unable To Answer (10/13/2023)  Utilities: Patient Unable To Answer (10/13/2023)  Alcohol  Screen: Low Risk  (04/06/2023)  Depression (PHQ2-9): Low Risk  (09/06/2023)  Financial Resource Strain: Low Risk  (04/06/2023)  Physical Activity: Inactive (04/06/2023)  Social Connections: Unknown (10/13/2023)  Stress: No Stress Concern Present (04/06/2023)  Tobacco Use: Low Risk  (10/10/2023)  Health Literacy: Adequate Health Literacy (04/06/2023)    Readmission Risk Interventions     No data to display

## 2023-11-09 DIAGNOSIS — G3184 Mild cognitive impairment, so stated: Secondary | ICD-10-CM | POA: Diagnosis not present

## 2023-11-09 DIAGNOSIS — I130 Hypertensive heart and chronic kidney disease with heart failure and stage 1 through stage 4 chronic kidney disease, or unspecified chronic kidney disease: Secondary | ICD-10-CM | POA: Diagnosis not present

## 2023-11-09 DIAGNOSIS — R339 Retention of urine, unspecified: Secondary | ICD-10-CM | POA: Diagnosis not present

## 2023-11-09 DIAGNOSIS — G9349 Other encephalopathy: Secondary | ICD-10-CM | POA: Diagnosis not present

## 2023-11-09 DIAGNOSIS — N179 Acute kidney failure, unspecified: Secondary | ICD-10-CM | POA: Diagnosis not present

## 2023-11-09 DIAGNOSIS — G319 Degenerative disease of nervous system, unspecified: Secondary | ICD-10-CM | POA: Diagnosis not present

## 2023-11-09 DIAGNOSIS — R9082 White matter disease, unspecified: Secondary | ICD-10-CM | POA: Diagnosis not present

## 2023-11-09 DIAGNOSIS — I4891 Unspecified atrial fibrillation: Secondary | ICD-10-CM | POA: Diagnosis not present

## 2023-11-12 DIAGNOSIS — G3184 Mild cognitive impairment, so stated: Secondary | ICD-10-CM | POA: Diagnosis not present

## 2023-11-12 DIAGNOSIS — I4891 Unspecified atrial fibrillation: Secondary | ICD-10-CM | POA: Diagnosis not present

## 2023-11-12 DIAGNOSIS — I130 Hypertensive heart and chronic kidney disease with heart failure and stage 1 through stage 4 chronic kidney disease, or unspecified chronic kidney disease: Secondary | ICD-10-CM | POA: Diagnosis not present

## 2023-11-12 DIAGNOSIS — G9340 Encephalopathy, unspecified: Secondary | ICD-10-CM | POA: Diagnosis not present

## 2023-11-12 DIAGNOSIS — R339 Retention of urine, unspecified: Secondary | ICD-10-CM | POA: Diagnosis not present

## 2023-11-12 DIAGNOSIS — N179 Acute kidney failure, unspecified: Secondary | ICD-10-CM | POA: Diagnosis not present

## 2023-11-12 DIAGNOSIS — R9082 White matter disease, unspecified: Secondary | ICD-10-CM | POA: Diagnosis not present

## 2023-11-13 DIAGNOSIS — R339 Retention of urine, unspecified: Secondary | ICD-10-CM | POA: Diagnosis not present

## 2023-11-13 DIAGNOSIS — W19XXXA Unspecified fall, initial encounter: Secondary | ICD-10-CM | POA: Diagnosis not present

## 2023-11-13 DIAGNOSIS — I4891 Unspecified atrial fibrillation: Secondary | ICD-10-CM | POA: Diagnosis not present

## 2023-11-13 DIAGNOSIS — I10 Essential (primary) hypertension: Secondary | ICD-10-CM | POA: Diagnosis not present

## 2023-11-13 DIAGNOSIS — Z7901 Long term (current) use of anticoagulants: Secondary | ICD-10-CM | POA: Diagnosis not present

## 2023-11-13 DIAGNOSIS — N1832 Chronic kidney disease, stage 3b: Secondary | ICD-10-CM | POA: Diagnosis not present

## 2023-11-13 DIAGNOSIS — Z79899 Other long term (current) drug therapy: Secondary | ICD-10-CM | POA: Diagnosis not present

## 2023-11-14 DIAGNOSIS — M6281 Muscle weakness (generalized): Secondary | ICD-10-CM | POA: Diagnosis not present

## 2023-11-14 DIAGNOSIS — I480 Paroxysmal atrial fibrillation: Secondary | ICD-10-CM | POA: Diagnosis not present

## 2023-11-14 DIAGNOSIS — G9341 Metabolic encephalopathy: Secondary | ICD-10-CM | POA: Diagnosis not present

## 2023-11-14 DIAGNOSIS — R2689 Other abnormalities of gait and mobility: Secondary | ICD-10-CM | POA: Diagnosis not present

## 2023-11-15 DIAGNOSIS — Z8673 Personal history of transient ischemic attack (TIA), and cerebral infarction without residual deficits: Secondary | ICD-10-CM | POA: Diagnosis not present

## 2023-11-15 DIAGNOSIS — M25562 Pain in left knee: Secondary | ICD-10-CM | POA: Diagnosis not present

## 2023-11-15 DIAGNOSIS — M25561 Pain in right knee: Secondary | ICD-10-CM | POA: Diagnosis not present

## 2023-11-15 DIAGNOSIS — I1310 Hypertensive heart and chronic kidney disease without heart failure, with stage 1 through stage 4 chronic kidney disease, or unspecified chronic kidney disease: Secondary | ICD-10-CM | POA: Diagnosis not present

## 2023-11-15 DIAGNOSIS — I4891 Unspecified atrial fibrillation: Secondary | ICD-10-CM | POA: Diagnosis not present

## 2023-11-15 DIAGNOSIS — N1832 Chronic kidney disease, stage 3b: Secondary | ICD-10-CM | POA: Diagnosis not present

## 2023-11-15 DIAGNOSIS — G9349 Other encephalopathy: Secondary | ICD-10-CM | POA: Diagnosis not present

## 2023-11-16 DIAGNOSIS — I4891 Unspecified atrial fibrillation: Secondary | ICD-10-CM | POA: Diagnosis not present

## 2023-11-16 DIAGNOSIS — M170 Bilateral primary osteoarthritis of knee: Secondary | ICD-10-CM | POA: Diagnosis not present

## 2023-11-16 DIAGNOSIS — G9349 Other encephalopathy: Secondary | ICD-10-CM | POA: Diagnosis not present

## 2023-11-16 DIAGNOSIS — Z7901 Long term (current) use of anticoagulants: Secondary | ICD-10-CM | POA: Diagnosis not present

## 2023-11-16 DIAGNOSIS — I1310 Hypertensive heart and chronic kidney disease without heart failure, with stage 1 through stage 4 chronic kidney disease, or unspecified chronic kidney disease: Secondary | ICD-10-CM | POA: Diagnosis not present

## 2023-11-17 NOTE — H&P (Signed)
 Date of Service: 10/10/2023  7:46 PM   Signed     Expand All Collapse All       Connor Dixon FMW:[id number] DOB: 12-03-1934 DOA: 10/10/2023     PCP: [name], MD   Patient arrived to ER on 10/10/23 at 1701 Referred by Attending Melvenia Motto, MD     Patient coming from:    home Lives alone,       Chief Complaint:      Chief Complaint  Patient presents with   Altered Mental Status      HPI: Connor Dixon is a 89 y.o. male with medical history significant of a.fib on eliquis , complete heart block sp pacemaker, DM2, HTN, HLD, CKD     Presented with  found down and unresponsive Presents with confusion last known well was 7 PM last night. EMS was called patient was found in recliner in a puddle of urine unresponsive with right-sided facial droop Bilateral arms contracted and nonverbal He lives alone CBG was 236 blood pressure 126/80 He has a history of A-fib on Eliquis  patient is diabetic It seems that he did not take his pills last evening CT head nonacute CTA no LVO Neurology have seen and recommends continuous EEG   Denies significant ETOH intake   Does not smoke    Regarding pertinent Chronic problems:      Hyperlipidemia -  on statins  Pravachol  Lipid Panel  Labs (Brief)          Component Value Date/Time    CHOL 150 05/08/2023 0833    TRIG 53.0 05/08/2023 0833    HDL 51.90 05/08/2023 0833    CHOLHDL 3 05/08/2023 0833    VLDL 10.6 05/08/2023 0833    LDLCALC 88 05/08/2023 0833    LDLCALC 71 12/10/2019 1046         HTN on Coreg , lasix , cozaar     DM 2 -  Recent Labs       Lab Results  Component Value Date    HGBA1C 8.7 (H) 09/06/2023     on insulin ,      obesity-      BMI Readings from Last 1 Encounters:  10/10/23 30.50 kg/m      A. Fib -   atrial fibrillation CHA2DS2 vas score     6   current  on anticoagulation with Eliquis ,          -  Rate control:  Currently controlled with  Coreg         CKD stage IIIb baseline Cr  2.2 Estimated Creatinine Clearance: 24.2 mL/min (A) (by C-G formula based on SCr of 2.4 mg/dL (H)).   Recent Labs       Lab Results  Component Value Date    CREATININE 2.40 (H) 10/10/2023    CREATININE 2.48 (H) 10/10/2023    CREATININE 2.26 (H) 05/08/2023      Recent Labs       Lab Results  Component Value Date    NA 137 10/10/2023    CL 101 10/10/2023    K 4.1 10/10/2023    CO2 23 10/10/2023    BUN 40 (H) 10/10/2023    CREATININE 2.40 (H) 10/10/2023    GFRNONAA 25 (L) 10/10/2023    CALCIUM 9.1 10/10/2023    PHOS 3.6 12/31/2014    ALBUMIN 3.1 (L) 10/10/2023    GLUCOSE 217 (H) 10/10/2023        While in ER:  Ct head and CTA non  acute    Lab Orders         Blood Culture (routine x 2)         Protime-INR         APTT         CBC         Differential         Comprehensive metabolic panel         Ethanol         Urine rapid drug screen (hosp performed)         Urinalysis, w/ Reflex to Culture (Infection Suspected) -Urine, Clean Catch         CK         CBG monitoring, ED         I-stat chem 8, ED         I-Stat Lactic Acid, ED        CT HEAD   NON acute  Remote lacunar infarct in the anterior right corona radiata  MRI brain  ordered   CTA No evidence of significant stenosis, aneurysmal dilatation, or dissection involving the arteries of the head and neck. 3. Atherosclerotic changes within the cavernous internal carotid arteries bilaterally without significant stenosis through the ICA termini. The pertinent results were texted to Dr. Michaela via the [facility] system at   CXR -  NON acute     Following Medications were ordered in ER: Medications  iohexol  (OMNIPAQUE ) 350 MG/ML injection 60 mL (60 mLs Intravenous Contrast Given 10/10/23 1744)  lactated ringers  bolus 1,000 mL (0 mLs Intravenous Stopped 10/10/23 1946)    _______________________________________________________ ER Provider Called:     Neurology    Dr.Kirkpatrick,  SEEN in ER continuous EEG   PCCM has been consulted at this time  Case discussed with Dr. Orlin Fairly he agrees that patient has dense encephalopathy but at this time feels that ICU/PCCM has nothing to offer         ED Triage Vitals  Encounter Vitals Group     BP 10/10/23 1705 (!) 153/91     Systolic BP Percentile --       Diastolic BP Percentile --       Pulse Rate 10/10/23 1706 (!) 102     Resp 10/10/23 1706 15     Temp 10/10/23 1808 99.1 F (37.3 C)     Temp src --       SpO2 10/10/23 1706 100 %     Weight 10/10/23 1705 200 lb 9.9 oz (91 kg)     Height --       Head Circumference --       Peak Flow --       Pain Score --       Pain Loc --       Pain Education --       Exclude from Growth Chart --    UFJK(75)@     _________________________________________ Significant initial  Findings:      Abnormal Labs Reviewed  PROTIME-INR - Abnormal; Notable for the following components:      Result Value     Prothrombin Time 15.7 (*)      All other components within normal limits  APTT - Abnormal; Notable for the following components:    aPTT 37 (*)      All other components within normal limits  CBC - Abnormal; Notable for the following components:    WBC 10.7 (*)      All other  components within normal limits  DIFFERENTIAL - Abnormal; Notable for the following components:    Neutro Abs 8.2 (*)      Monocytes Absolute 1.3 (*)      All other components within normal limits  COMPREHENSIVE METABOLIC PANEL WITH GFR - Abnormal; Notable for the following components:    Glucose, Bld 222 (*)      BUN 41 (*)      Creatinine, Ser 2.48 (*)      Albumin 3.1 (*)      Total Bilirubin 1.8 (*)      GFR, Estimated 25 (*)      All other components within normal limits  URINALYSIS, W/ REFLEX TO CULTURE (INFECTION SUSPECTED) - Abnormal; Notable for the following components:    Glucose, UA 50 (*)      Hgb urine dipstick MODERATE (*)      Protein, ur >=300 (*)      Bacteria, UA RARE (*)      All other components  within normal limits  CBG MONITORING, ED - Abnormal; Notable for the following components:    Glucose-Capillary 172 (*)      All other components within normal limits  I-STAT CHEM 8, ED - Abnormal; Notable for the following components:    BUN 40 (*)      Creatinine, Ser 2.40 (*)      Glucose, Bld 217 (*)      Calcium, Ion 1.14 (*)      All other components within normal limits  TROPONIN I (HIGH SENSITIVITY) - Abnormal; Notable for the following components:    Troponin I (High Sensitivity) 132 (*)      All other components within normal limits    _________________________ Troponin  ordered Cardiac Panel (last 3 results) Recent Labs (last 2 labs)      Recent Labs    10/10/23 1710 10/10/23 1747  CKTOTAL 102  --   TROPONINIHS 147* 132*          ECG: Ordered Personally reviewed and interpreted by me showing: HR : 100 Rhythm:  Paced Atrial fibrillation Nonspecific IVCD with LAD Left ventricular hypertrophy Anterior infarct, old   QTC >600   The recent clinical data is shown below.       Vitals:    10/10/23 1809 10/10/23 1810 10/10/23 1815 10/10/23 1830  BP:     (!) 145/95    Pulse: 100 94 100 90  Resp: (!) 32 19 (!) 27 17  Temp: 99.2 F (37.3 C) 99.2 F (37.3 C) 99.3 F (37.4 C) 99.4 F (37.4 C)  SpO2: 100% 100% 100% 100%  Weight:            WBC   Labs (Brief)          Component Value Date/Time    WBC 10.7 (H) 10/10/2023 1710    LYMPHSABS 1.0 10/10/2023 1710    LYMPHSABS 2.0 02/08/2019 1058    MONOABS 1.3 (H) 10/10/2023 1710    EOSABS 0.0 10/10/2023 1710    EOSABS 0.2 02/08/2019 1058    BASOSABS 0.0 10/10/2023 1710    BASOSABS 0.0 02/08/2019 1058      Lactic Acid, Venous Labs (Brief)          Component Value Date/Time    LATICACIDVEN 2.9 (HH) 10/10/2023 2102           Procalcitonin  0.11       UA   no evidence of UTI mild hematuria   Urine analysis: Labs (Brief)  Component Value Date/Time    COLORURINE YELLOW 10/10/2023 1747     APPEARANCEUR CLEAR 10/10/2023 1747    LABSPEC 1.011 10/10/2023 1747    PHURINE 5.0 10/10/2023 1747    GLUCOSEU 50 (A) 10/10/2023 1747    GLUCOSEU 100 (A) 05/08/2023 0833    HGBUR MODERATE (A) 10/10/2023 1747    BILIRUBINUR NEGATIVE 10/10/2023 1747    KETONESUR NEGATIVE 10/10/2023 1747    PROTEINUR >=300 (A) 10/10/2023 1747    UROBILINOGEN 0.2 05/08/2023 0833    NITRITE NEGATIVE 10/10/2023 1747    LEUKOCYTESUR NEGATIVE 10/10/2023 1747        >         Results for orders placed or performed during the hospital encounter of 02/13/19  Surgical PCR screen     Status: None    Collection Time: 02/13/19  6:39 AM    Specimen: Nasal Mucosa; Nasal Swab  Result Value Ref Range Status    MRSA, PCR NEGATIVE NEGATIVE Final    Staphylococcus aureus NEGATIVE NEGATIVE Final      Comment: (NOTE) The Xpert SA Assay (FDA approved for NASAL specimens in patients 17 years of age and older), is one component of a comprehensive surveillance program. It is not intended to diagnose infection nor to guide or monitor treatment. Performed at [facility], [address] [place], [place] [address]           Venous  Blood Gas result:  pH  7.518 High  Sodium 135 mmol/L    pCO2, Ven 29.1 Low  mmHg Potassium 4.4 mmol/L  pO2, Ven 39 mmHg      __________________________________________________________ Last Labs      Recent Labs  Lab 10/10/23 1710 10/10/23 1803  NA 135 137  K 4.0 4.1  CO2 23  --   GLUCOSE 222* 217*  BUN 41* 40*  CREATININE 2.48* 2.40*  CALCIUM 9.1  --         Cr  stable,   Recent Labs       Lab Results  Component Value Date    CREATININE 2.40 (H) 10/10/2023    CREATININE 2.48 (H) 10/10/2023    CREATININE 2.26 (H) 05/08/2023        Last Labs     Recent Labs  Lab 10/10/23 1710  AST 34  ALT 14  ALKPHOS 67  BILITOT 1.8*  PROT 6.8  ALBUMIN 3.1*      Recent Labs       Lab Results  Component Value Date    CALCIUM 9.1 10/10/2023    PHOS 3.6  12/31/2014      Plt: Recent Labs       Lab Results  Component Value Date    PLT 300 10/10/2023         Last Labs      Recent Labs  Lab 10/10/23 1710 10/10/23 1803  WBC 10.7*  --   NEUTROABS 8.2*  --   HGB 15.2 16.0  HCT 46.0 47.0  MCV 91.6  --   PLT 300  --         HG/HCT  stable,   Labs (Brief)          Component Value Date/Time    HGB 16.0 10/10/2023 1803    HGB 15.9 02/08/2019 1058    HCT 47.0 10/10/2023 1803    HCT 47.7 02/08/2019 1058    MCV 91.6 10/10/2023 1710    MCV 91 02/08/2019 1058      _______________________________________________ Hospitalist  was called for admission for   Acute encephalopathy      The following Work up has been ordered so far:      Orders Placed This Encounter  Procedures   Blood Culture (routine x 2)   CT HEAD WO CONTRAST   CT ANGIO HEAD NECK W WO CM   DG Chest Port 1 View   MR BRAIN WO CONTRAST   Protime-INR   APTT   CBC   Differential   Comprehensive metabolic panel   Ethanol   Urine rapid drug screen (hosp performed)   Urinalysis, w/ Reflex to Culture (Infection Suspected) -Urine, Clean Catch   CK   Diet NPO time specified   Vital signs   ED Cardiac monitoring   NIH Stroke Scale   Swallow screen   Initiate Carrier Fluid Protocol   If O2 sat <94% Administer O2 @ 2 Liters/Minute If O2 Sat < 94%, administer O2 at 2 liters/minute via nasal cannula.   Insert foley catheter   Check Rectal Temperature   Insert temp foley   Consult to neurology   Consult to hospitalist   ED Pulse oximetry, continuous   CBG monitoring, ED   I-stat chem 8, ED   I-Stat Lactic Acid, ED   ED EKG   EKG 12-Lead   Overnight EEG with video   Saline lock IV      OTHER Significant initial  Findings:   labs showing: DM  labs:  HbA1C: Recent Labs (within last 365 days)       Recent Labs    01/05/23 1633 05/08/23 0833 09/06/23 1508  HGBA1C 8.5* 8.7* 8.7*         CBG (last 3)  Recent Labs (last 2 labs)     Recent Labs     10/10/23 1706  GLUCAP 172*      Cultures: Labs (Brief)  No results found for: SDES, SPECREQUEST, CULT, REPTSTATUS     Radiological Exams on Admission:  Imaging Results (Last 48 hours)  DG Chest Port 1 View Result Date: 10/10/2023 CLINICAL DATA:  Possible sepsis EXAM: PORTABLE CHEST 1 VIEW COMPARISON:  05/08/2023 FINDINGS: The heart size and mediastinal contours are within normal limits. Both lungs are clear. The visualized skeletal structures are unremarkable. Left-sided pacing device as before. IMPRESSION: No active disease. Electronically Signed   By: Luke Bun M.D.   On: 10/10/2023 19:00    CT ANGIO HEAD NECK W WO CM Result Date: 10/10/2023 EXAM: CTA Head and Neck with Intravenous Contrast. CLINICAL HISTORY: Neuro deficit, acute, stroke suspected. AMS. Possible stroke. Facial droop. Patient was found unresponsive. TECHNIQUE: Axial CTA images of the head and neck performed with intravenous contrast. Two-dimensional MIP and/or three-dimensional MIP and volume rendered reformations were performed. Note: Per PQRS, the description of internal carotid artery percent stenosis, including 0 percent or normal exam, is based on [place] American Symptomatic Carotid Endarterectomy Trial (NASCET) criteria. Dose reduction technique was used including one or more of the following: automated exposure control, adjustment of mA and kV according to patient size, and/or iterative reconstruction. CONTRAST: 60 mL iohexol  (OMNIPAQUE ) 350 MG/ML injection. COMPARISON: None provided. FINDINGS: CTA NECK: COMMON CAROTID ARTERIES: No significant stenosis. No dissection or occlusion. INTERNAL CAROTID ARTERIES: No significant stenosis. No dissection or occlusion. VERTEBRAL ARTERIES: No significant stenosis. No dissection or occlusion. CTA HEAD: ANTERIOR CEREBRAL ARTERIES: Atherosclerotic changes are present within the cavernous internal carotid arteries bilaterally without significant stenosis through the ICA  termini. No significant stenosis. No occlusion. No aneurysm. MIDDLE  CEREBRAL ARTERIES: No significant stenosis. No occlusion. No aneurysm. POSTERIOR CEREBRAL ARTERIES: No significant stenosis. No occlusion. No aneurysm. BASILAR ARTERY: No significant stenosis. No occlusion. No aneurysm. OTHER: SOFT TISSUES: No acute finding. No masses or lymphadenopathy. BONES: No acute osseous abnormality. IMPRESSION: 1. No large vessel occlusion 2. No evidence of significant stenosis, aneurysmal dilatation, or dissection involving the arteries of the head and neck. 3. Atherosclerotic changes within the cavernous internal carotid arteries bilaterally without significant stenosis through the ICA termini. The pertinent results were texted to Dr. Michaela via the [facility] system at 5:51 PM. Electronically signed by: Lonni Necessary MD 10/10/2023 06:03 PM EDT RP Workstation: [id number]    CT HEAD WO CONTRAST Result Date: 10/10/2023 EXAM: CT HEAD WITHOUT 10/10/2023 05:17:00 PM TECHNIQUE: CT of the head was performed without the administration of intravenous contrast. Automated exposure control, iterative reconstruction, and/or weight based adjustment of the mA/kV was utilized to reduce the radiation dose to as low as reasonably achievable. COMPARISON: None available. CLINICAL HISTORY: Right-sided facial droop. The patient is unresponsive. Neuro deficit, acute, stroke suspected. FINDINGS: BRAIN AND VENTRICLES: There is no acute intracranial hemorrhage, mass effect or midline shift. No abnormal extra-axial fluid collection. The gray-white differentiation is maintained without evidence of an acute infarct. There is no evidence of hydrocephalus. Moderate atrophy and diffuse periventricular white matter disease are present bilaterally. A remote lacunar infarct is present in the anterior right corona radiata. Atherosclerotic calcifications are present in the cavernous carotid arteries bilaterally and at the dural margin of both  vertebral arteries. No hyperdense vessel is present. ASPECTS: Ganglionic level: 7/7 Supraganglionic level: 3/3 Total: 10/10 ORBITS: Bilateral lens replacements are noted. The globes and orbits are otherwise within normal limits. SINUSES: The visualized paranasal sinuses and mastoid air cells demonstrate no acute abnormality. SOFT TISSUES AND SKULL: No acute abnormality of the visualized skull or soft tissues. IMPRESSION: 1. No acute intracranial abnormality.  ASPECTS 10/10 2. Moderate atrophy and diffuse periventricular white matter disease bilaterally. 3. Remote lacunar infarct in the anterior right corona radiata. The pertinent results were texted to Dr. Michaela via the [facility] system at 5:51 PM. Electronically signed by: Lonni Necessary MD 10/10/2023 05:52 PM EDT RP Workstation: [id number]     _______________________________________________________________________________________________________ Latest  Blood pressure (!) 145/95, pulse 90, temperature 99.4 F (37.4 C), resp. rate 17, weight 91 kg, SpO2 100%.    Vitals  labs and radiology finding personally reviewed  Review of Systems:     Pertinent positives include: unable to answer   Constitutional:  No weight loss, night sweats, Fevers, chills, fatigue, weight loss  HEENT:  No headaches, Difficulty swallowing,Tooth/dental problems,Sore throat,  No sneezing, itching, ear ache, nasal congestion, post nasal drip,  Cardio-vascular:  No chest pain, Orthopnea, PND, anasarca, dizziness, palpitations.no Bilateral lower extremity swelling  GI:  No heartburn, indigestion, abdominal pain, nausea, vomiting, diarrhea, change in bowel habits, loss of appetite, melena, blood in stool, hematemesis Resp:  no shortness of breath at rest. No dyspnea on exertion, No excess mucus, no productive cough, No non-productive cough, No coughing up of blood.No change in color of mucus.No wheezing. Skin:  no rash or lesions. No jaundice GU:  no dysuria,  change in color of urine, no urgency or frequency. No straining to urinate.  No flank pain.  Musculoskeletal:  No joint pain or no joint swelling. No decreased range of motion. No back pain.  Psych:  No change in mood or affect. No depression or anxiety. No memory loss.  Neuro: no localizing  neurological complaints, no tingling, no weakness, no double vision, no gait abnormality, no slurred speech, no confusion   All systems reviewed and apart from HOPI all are negative _______________________________________________________________________________________________ Past Medical History:       Past Medical History:  Diagnosis Date   Acute on chronic diastolic congestive heart failure (HCC)     Arthus phenomenon     Atrioventricular block, complete (HCC)     Cardiac pacemaker in situ 07/22/2010   Chronic kidney disease, stage 3 (HCC)      Presumably from longstanding DM & HTN. Scr has gradually increased over the past 2 years (1.6 in 12/2010, 2 in 03/2011, and up to 2.1 in 01/2013)   DIABETES MELLITUS, TYPE I, ADULT ONSET 08/06/2007   DM (diabetes mellitus) (HCC)      adult onset    Essential hypertension, benign 08/06/2007    Very good BP control   History of second degree heart block     HLD (hyperlipidemia)     HYPERLIPIDEMIA 08/06/2007   Hypopotassemia     Morbid obesity (HCC)     Obesity     Other and unspecified angina pectoris      typical   Type I (juvenile type) diabetes mellitus without mention of complication, not stated as uncontrolled      (04/25/13 OV with Dr. Rayburn) = Poorly controlled & is working  w/Dr. Karlos to improve this. Most recent Hgb A1c was 8.2% but Mr. Unrein reports it was down to 7.6 last month.                 Past Surgical History:  Procedure Laterality Date   CATARACT EXTRACTION   8/08    [place]   PPM GENERATOR CHANGEOUT N/A 02/13/2019    Procedure: PPM GENERATOR CHANGEOUT;  Surgeon: Waddell Danelle ORN, MD;  Location[facility] INVASIVE CV LAB;   Service: Cardiovascular;  Laterality: N/A;   PTVDP   12/11    PPM - [employer]   TOTAL HIP ARTHROPLASTY   04/05/06    right          Social History:   Ambulatory  independently     reports that he has never smoked. He has never used smokeless tobacco. He reports that he does not drink alcohol  and does not use drugs. Family History:        Family History  Problem Relation Age of Onset   Asthma Father     Coronary artery disease Father     Diabetes Father     Hyperlipidemia Father     Stroke Father 49   Coronary artery disease Mother     Asthma Mother     Heart attack Mother 53   Colon cancer Neg Hx          no definate prostate CA         ______________________________________________________________________________________________ Allergies: Allergies       Allergies  Allergen Reactions   Oysters [Shellfish Allergy]        Blisters and swelling   Farxiga  [Dapagliflozin ] Other (See Comments)      Low blood sugar   Apple Juice Other (See Comments)      Sore throat                 Prior to Admission medications   Medication Sig Start Date End Date Taking? Authorizing Provider  allopurinol  (ZYLOPRIM ) 100 MG tablet TAKE 1 TABLET(100 MG) BY MOUTH DAILY 09/25/23     [name],  MD  amoxicillin  (AMOXIL ) 500 MG capsule TAKE ONE CAPSULE BY MOUTH THREE TIMES DAILY. TAKE ONLY WHEN HAVING DENTAL PROCEDURES 07/04/16     [name], MD  apixaban  (ELIQUIS ) 2.5 MG TABS tablet Take 1 tablet (2.5 mg total) by mouth 2 (two) times daily. 08/14/23     [name], MD  calcitRIOL  (ROCALTROL ) 0.25 MCG capsule TAKE 1 CAPSULE BY MOUTH DAILY RETURN IN ABOUT 6 MONTHS(AROUND 02/04/2023) 08/14/23     [name], MD  carvedilol  (COREG ) 6.25 MG tablet TAKE 1 TABLET(6.25 MG) BY MOUTH TWICE DAILY WITH A MEAL 07/03/23     [name], MD  Continuous Glucose Receiver (DEXCOM G7 RECEIVER) DEVI 1 Act by Does not apply route daily. 01/06/23     [name], MD  Continuous Glucose  Sensor (DEXCOM G7 SENSOR) MISC 1 Act by Does not apply route daily. 01/06/23     [name], MD  furosemide  (LASIX ) 40 MG tablet Take 2 tablets in the morning and 1 tablet in the afternoon. 01/29/20     Waddell Danelle ORN, MD  Glucagon  (GVOKE HYPOPEN  2-PACK) 1 MG/0.2ML SOAJ Inject 1 Act into the skin daily as needed. 04/05/22     [name], MD  insulin  isophane & regular human KwikPen (HUMULIN  70/30 KWIKPEN) (70-30) 100 UNIT/ML KwikPen 40 units in AM and 25 units in PM 09/18/23     [name], MD  levocetirizine (XYZAL ) 5 MG tablet Take 1 tablet (5 mg total) by mouth every evening. 09/06/23     [name], MD  loratadine (CLARITIN) 10 MG tablet Take 10 mg by mouth daily as needed for allergies.       [provider]  losartan  (COZAAR ) 25 MG tablet Take 25 mg by mouth daily.       [provider]  pravastatin  (PRAVACHOL ) 40 MG tablet Take 1 tablet (40 mg total) by mouth daily. TAKE 1 TABLET(40 MG) BY MOUTH DAILY 05/08/23     [name], MD      ___________________________________________________________________________________________________ Physical Exam:     10/10/2023    6:30 PM 10/10/2023    6:15 PM 10/10/2023    6:10 PM  Vitals with BMI  Systolic   145    Diastolic   95    Pulse 90 100 94    1. General:  in No Acute distress Chronically ill -appearing 2. Psychological:not Oriented, unable to follow commands 3. Head/ENT:  Dry Mucous Membranes                          Head Non traumatic, neck supple                          Poor Dentition 4. SKIN: decreased Skin turgor,  Skin clean Dry and intact no rash    5. Heart: Regular rate and rhythm no Murmur, no Rub or gallop 6. Lungs: no wheezes or crackles   7. Abdomen: Soft, non-tender, Non distended  8. Lower extremities: no clubbing, cyanosis, no edema 9. Neurologically not following commands  Seems to be retracting right leg to pain Head is rotated to the right gaze is rotated to the right Left  arm decorticate 10. MSK: Normal range of motion     Chart has been reviewed   ______________________________________________________________________________________________   Assessment/Plan  89 y.o. male with medical history significant of a.fib on eliquis , complete heart  block sp pacemaker, DM2, HTN, HLD, CKD  Admitted for  Acute encephalopathy      Present on Admission:  Acute encephalopathy  Elevated troponin  Essential hypertension  Hyperlipidemia with target LDL less than 70  Paroxysmal atrial fibrillation (HCC)  Sinus node dysfunction (HCC)  Prolonged QT interval       Elevated troponin Troponin stable, could be in the setting of brain injury  Echo in am If abnormal would get cardiology consult Monitor on tele   Essential hypertension Allow permissive HTN   Hyperlipidemia with target LDL less than 70 Not able to tolerate PO statin at this time   Insulin -requiring or dependent type II diabetes mellitus (HCC)  - Order Sensitive  SSI   -  change insulin  to 20 units qhs  -  check TSH and HgA1C  - Hold by mouth medications      Paroxysmal atrial fibrillation (HCC) Hold eliquis  for tonight, pt unable to tolerate PO  Stroke suspected would not be a good candidate for heparin   Sinus node dysfunction (HCC) Sp pacemaker   Prolonged QT interval - will monitor on tele avoid QT prolonging medications, rehydrate correct electrolytes     Acute encephalopathy Concerning for significant brain injury Continuous EEG ordered MRI in a.m. ordered Rehydrate monitor for any reversible causes. Appreciate neurology consult discussed case at length with son At this point they wish for patient to be DNR and resuscitate DO NOT INTUBATE Family is in route Prognosis at this point appears to be poor Will know more once results of MRI are back Palliative care consult PCCM is also aware at this point they feel that patient has no indication for ICU admission     Other plan  as per orders.   DVT prophylaxis:  SCD      Code Status:  DNR/DNI  as per family  I had personally discussed CODE STATUS with  family  ACP   none    Family Communication:   Family  at  Bedside  plan of care was discussed on the phone with   Son,   Diet  Diet Orders (From admission, onward)        Start     Ordered    10/10/23 2239   Diet NPO time specified  Diet effective now        10/10/23 2238                Disposition Plan:     likely will need placement for rehabilitation                         Following barriers for discharge:                              Stroke  work up is complete                                                      Will need consultants to evaluate patient prior to discharge                                 Consult Orders  (From admission, onward)  Start     Ordered    10/10/23 1924   Consult to hospitalist  Pg by [name]  Once       Provider:  (Not yet assigned)  Question Answer Comment  Place call to: [facility]    Reason for Consult Admit       10/10/23 1923                                     Would benefit from PT/OT eval prior to DC  Ordered                   Swallow eval - SLP ordered                                     Transition of care consulted                                             Palliative care    consulted                                  Consults called:   Neurology, PCCM     Admission status:  ED Disposition       ED Disposition  Admit   Condition  --   Comment  Hospital Area: [facility] [[id number]]  Level of Care: Progressive [102]  Admit to Progressive based on following criteria: NEUROLOGICAL AND NEUROSURGICAL complex patients with significant risk of instability, who do not meet ICU criteria, yet require close observation or frequent assessment (< / = every 2 - 4 hours) with medical / nursing intervention.  May admit patient to [facility] or [facility]  if equivalent level of care is available:: No  Covid Evaluation: Asymptomatic - no recent exposure (last 10 days) testing not required  Diagnosis: Acute encephalopathy [315562]  Admitting Physician: Jamonica Schoff [[id number]]  Attending Physician: Jeanne Diefendorf [[id number]]  Certification:: I certify this patient will need inpatient services for at least 2 midnights  Expected Medical Readiness: 10/13/2023              inpatient      I Expect 2 midnight stay secondary to severity of patient's current illness need for inpatient interventions justified by the following:     Severe lab/radiological/exam abnormalities including:    Acute encephalopathy     and extensive comorbidities including:   DM2   Chronic anticoagulation   That are currently affecting medical management.     I expect  patient to be hospitalized for 2 midnights requiring inpatient medical care.   Patient is at high risk for adverse outcome (such as loss of life or disability) if not treated.   Indication for inpatient stay as follows:  Severe change from baseline regarding mental status  Need for  IV fluids   Level of care        progressive      tele indefinitely please discontinue once patient no longer qualifies COVID-19 Labs     Critical   Patient is critically ill due to dense encephalopathy They are at high risk for life/limb threatening clinical deterioration  requiring frequent reassessment and modifications of care.  Services provided include examination of the patient, review of relevant ancillary tests, prescription of lifesaving therapies, review of medications and prophylactic therapy.  Total critical care time excluding separately billable procedures: 60  Minutes.   Casidy Alberta 10/10/2023, 11:25 PM     [facility]      after 2 AM please page floor coverage   If 7AM-7PM, please contact the day team taking care of the patient using [email or web address]             Electronically  signed by Sy Saintjean, MD at 10/10/2023 11:25 PM

## 2023-11-20 DIAGNOSIS — I1310 Hypertensive heart and chronic kidney disease without heart failure, with stage 1 through stage 4 chronic kidney disease, or unspecified chronic kidney disease: Secondary | ICD-10-CM | POA: Diagnosis not present

## 2023-11-20 DIAGNOSIS — Z9181 History of falling: Secondary | ICD-10-CM | POA: Diagnosis not present

## 2023-11-20 DIAGNOSIS — G9349 Other encephalopathy: Secondary | ICD-10-CM | POA: Diagnosis not present

## 2023-11-20 DIAGNOSIS — I4891 Unspecified atrial fibrillation: Secondary | ICD-10-CM | POA: Diagnosis not present

## 2023-11-20 DIAGNOSIS — E1122 Type 2 diabetes mellitus with diabetic chronic kidney disease: Secondary | ICD-10-CM | POA: Diagnosis not present

## 2023-11-21 DIAGNOSIS — I480 Paroxysmal atrial fibrillation: Secondary | ICD-10-CM | POA: Diagnosis not present

## 2023-11-21 DIAGNOSIS — G9341 Metabolic encephalopathy: Secondary | ICD-10-CM | POA: Diagnosis not present

## 2023-11-21 DIAGNOSIS — M6281 Muscle weakness (generalized): Secondary | ICD-10-CM | POA: Diagnosis not present

## 2023-11-21 DIAGNOSIS — R2689 Other abnormalities of gait and mobility: Secondary | ICD-10-CM | POA: Diagnosis not present

## 2023-11-21 LAB — MISC LABCORP TEST (SEND OUT): Labcorp test code: 9985

## 2023-11-21 NOTE — Progress Notes (Unsigned)
 NEUROLOGY CONSULTATION NOTE  [name] MRN: [id number] DOB: Mar 02, 1935  Referring provider: Nilda Fendt, MD Primary care provider: Debby Molt, MD  Reason for consult:  acute encephalopathy  Assessment/Plan:   Acute encephalopathy with some parkinsonian features - unclear etiology.  While he likely had some degree of mild neurocognitive disorder, such an acute decline would not be part of the natural process.  Various etiologies have been ruled out, such as stroke, infectious, autoimmune and paraneoplastic.  He has CKD and may have been dehydrated, but kidney function seemed to be at baseline.  I don't suspect Lewy body dementia as he has no preceding history of hallucinations and frequent falls.  Current tremors and rigidity on exam is acute finding.  One etiology that wasn't checked would be prion disease, however, I would suspect that he would have had a continued decline.  At this point, I wouldn't pursue any further testing and favor monitoring for now.  He is still currently at [facility] and receiving PT/OT.  After discharge, he will need 24 hour supervision.  Total time spent in chart, reviewing imaging and face to face with patient and son:  90 minutes.   Subjective:  ABDULKAREEM Dixon is an 89 year old right-handed male with CHF, PAF on Eliquis , s/p PPM, CKD, DM I, HTN, and HLD who presents for acute encephalopathy.  History supplemented by hospital records and his accompanying son.  On 10/08/2023, patient's son went back home to [place]  after visiting his father for a few days.  His father was at baseline.  He lives alone independently and handles all of his daily activities.  He has a woman come by once a week to clean his home.  On 10/09/2023, he called her about bringing something over when she comes over the next day to clean.  On 5/27, she came to his home and found him soiled sitting in a chair slumped over and unresponsive.  He was admitted to [facility].   Presenting glucose was 176.  BP was 153/91.  His temperature was 99.4 F.  CBC revealed WBC 10.7 but otherwise no other signs of infection.  Electrolytes and hepatic panel were unremarkable.  BUN and Cr were 41 and 2.48 respectively, which is his baseline.  CT head and CTA head and neck were unremarkable.  Follow up MRI of brain revealed chronic small vessel ischemic changes and generalized volume loss, including hippocampal atrophy, but no acute findings.  Underwent LP which was negative for meningitis, encephalitis.  .  Blood work was also unremarkable for autoimmune or other infectious etiologies.  Prolonged EEG revealed moderate diffuse slowing but no evidence of seizure or epileptiform discharges.  Repeat MRI of brain with and without contrast on 6/3 again revealed no acute abnormalities or abnormal enhancement.  CT chest/abdomen/pelvis negative for malignancy.  B12 was 270 and was started on supplementation.  Treated with dexamethaxone in setting of empiric meningitis treatment without improvement.  He was then treated with IVIG which patient initially showed slow improvement but then plateaued.  Exact cause for acute encephalopathy was not determined but underlying dementia was suspected.  Noted to have rigidity on exam so neurology questioned possible Lewy body dementia.    Since discharge from the hospital, he has been residing at a SNF, [facility].  Overall he is improved, but no significant improvement since leaving the hospital.  He has some days where he is confused but now has more days of lucidity.  He has been  able to recognize his family.  He is eating puree foods.  He has not been hallucinating or has been agitated.  He now has a tremor in either hand.  At baseline, he has always been high-functioning and independent.  He is a retired Technical sales engineer and school principal.  His son did note that he exhibited some mild cognitive changes.  For example, for the previous couple of months, he mentioned  that he was starting to have trouble keeping up with the bills and may need to start getting help.  The day before his son went home, he was out driving his father and his father seemed a little confused giving directions.  Otherwise, he didn't exhibit any cognitive changes concerning to his family.  He had not been experiencing hallucinations or falls.    Workup: Imaging: 10/10/2023 CT HEAD WO:  1. No acute intracranial abnormality.  ASPECTS 10/10 2. Moderate atrophy and diffuse periventricular white matter disease bilaterally. 3. Remote lacunar infarct in the anterior right corona radiata. 10/10/2023 CTA HEAD & NECK:  1. No large vessel occlusion 2. No evidence of significant stenosis, aneurysmal dilatation, or dissection involving the arteries of the head and neck. 3. Atherosclerotic changes within the cavernous internal carotid arteries bilaterally without significant stenosis through the ICA termini. 10/12/2023 MRI BRAIN WO:  1.  No acute intracranial abnormality by noncontrast MRI. 2. Signal changes compatible with chronic small vessel disease. Generalized appearing brain volume loss. 10/17/2023 MRI BRAIN W WO:  1. No acute intracranial abnormality. 2. Moderate atrophy and white matter disease bilaterally. 3. 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  Labs: 10/13/2023 CSF:  cell count 2,  protein 57, glucose 191, gram stain with predominantly mononuclear WBC, negative culture, negative meningitis/encephalitis panel, [facility] autoimmune/paraneoplastic panel negative, no oligoclonal bands, IgG index 0.6, nonreactive VDRL 05-10/2023 Serum:  negative ANA, sed rate 30, elevated CRP 8.9, negative SSA/SSB abs, nonreactive RPR, negative HIV, SARS Coronavirus 2 PCR negative, influenza A/B PCR negative, beta-hydroxybutyric acid elevated 2.23, ammonia <13 (repeat 22), CK 102, ethanol negative, TSH 1.002, B1 107.9, B12 270, folate  4.5   PAST MEDICAL HISTORY: Past Medical History:  Diagnosis Date   Acute on chronic diastolic congestive heart failure (HCC)    Arthus phenomenon    Atrioventricular block, complete (HCC)    Cardiac pacemaker in situ 07/22/2010   Chronic kidney disease, stage 3 (HCC)    Presumably from longstanding DM & HTN. Scr has gradually increased over the past 2 years (1.6 in 12/2010, 2 in 03/2011, and up to 2.1 in 01/2013)   DIABETES MELLITUS, TYPE I, ADULT ONSET 08/06/2007   DM (diabetes mellitus) (HCC)    adult onset    Essential hypertension, benign 08/06/2007   Very good BP control   History of second degree heart block    HLD (hyperlipidemia)    HYPERLIPIDEMIA 08/06/2007   Hypopotassemia    Morbid obesity (HCC)    Obesity    Other and unspecified angina pectoris    typical   Type I (juvenile type) diabetes mellitus without mention of complication, not stated as uncontrolled    (04/25/13 OV with Dr. Rayburn) = Poorly controlled & is working  w/Dr. Karlos to improve this. Most recent Hgb A1c was 8.2% but Mr. Arcia reports it was down to 7.6 last month.    PAST SURGICAL HISTORY: Past Surgical History:  Procedure Laterality Date   CATARACT EXTRACTION  8/08  [place]   PPM GENERATOR CHANGEOUT N/A 02/13/2019   Procedure: PPM GENERATOR CHANGEOUT;  Surgeon: Waddell Danelle ORN, MD;  Location: [facility] INVASIVE CV LAB;  Service: Cardiovascular;  Laterality: N/A;   PTVDP  12/11   PPM - [employer]   TOTAL HIP ARTHROPLASTY  04/05/06   right    MEDICATIONS: Current Outpatient Medications on File Prior to Visit  Medication Sig Dispense Refill   allopurinol  (ZYLOPRIM ) 100 MG tablet TAKE 1 TABLET(100 MG) BY MOUTH DAILY (Patient taking differently: Take 100 mg by mouth daily.) 90 tablet 0   amLODipine  (NORVASC ) 10 MG tablet Take 1 tablet (10 mg total) by mouth daily.     amoxicillin  (AMOXIL ) 500 MG capsule TAKE ONE CAPSULE BY MOUTH THREE TIMES DAILY. TAKE ONLY WHEN HAVING DENTAL PROCEDURES (Patient  taking differently: Take 500 mg by mouth 3 (three) times daily. Take only when having dental procedures.) 30 capsule 2   apixaban  (ELIQUIS ) 2.5 MG TABS tablet Take 1 tablet (2.5 mg total) by mouth 2 (two) times daily. 60 tablet 5   calcitRIOL  (ROCALTROL ) 0.25 MCG capsule TAKE 1 CAPSULE BY MOUTH DAILY RETURN IN ABOUT 6 MONTHS(AROUND 02/04/2023) (Patient taking differently: Take 0.25 mcg by mouth daily.) 90 capsule 0   Continuous Glucose Sensor (DEXCOM G7 SENSOR) MISC 1 Act by Does not apply route daily. (Patient taking differently: Inject 1 Device into the skin See admin instructions. Change sensor every 10 days) 9 each 1   cyanocobalamin  1000 MCG tablet Take 1 tablet (1,000 mcg total) by mouth daily.     feeding supplement, GLUCERNA SHAKE, (GLUCERNA SHAKE) LIQD Take 237 mLs by mouth daily.     ferrous sulfate  325 (65 FE) MG tablet Take 1 tablet (325 mg total) by mouth daily with breakfast.     folic acid  (FOLVITE ) 1 MG tablet Take 1 tablet (1 mg total) by mouth daily.     furosemide  (LASIX ) 40 MG tablet Take 1 tablet (40 mg total) by mouth daily as needed for fluid or edema.     Glucagon  (GVOKE HYPOPEN  2-PACK) 1 MG/0.2ML SOAJ Inject 1 Act into the skin daily as needed. (Patient taking differently: Inject 1 Act into the muscle as needed (Hypoglycemia, low blood sugar).) 2 mL 5   insulin  isophane & regular human KwikPen (HUMULIN  70/30 KWIKPEN) (70-30) 100 UNIT/ML KwikPen Inject 15 Units into the skin in the morning and at bedtime. 40 units in AM and 25 units in PM     levocetirizine (XYZAL ) 5 MG tablet Take 1 tablet (5 mg total) by mouth every evening. 90 tablet 1   loratadine (CLARITIN) 10 MG tablet Take 10 mg by mouth daily as needed for allergies.     metoprolol  tartrate (LOPRESSOR ) 25 MG tablet Take 0.5 tablets (12.5 mg total) by mouth 2 (two) times daily.     tamsulosin  (FLOMAX ) 0.4 MG CAPS capsule Take 1 capsule (0.4 mg total) by mouth daily.     No current facility-administered medications on  file prior to visit.    ALLERGIES: Allergies  Allergen Reactions   Oysters [Shellfish Allergy] Hives and Swelling   Citrus Hives   Pecan Extract Hives   Apple Juice Other (See Comments)    Sore throat   Farxiga  [Dapagliflozin ] Other (See Comments)    Low blood sugar    FAMILY HISTORY: Family History  Problem Relation Age of Onset   Asthma Father    Coronary artery disease Father    Diabetes Father    Hyperlipidemia Father  Stroke Father 17   Coronary artery disease Mother    Asthma Mother    Heart attack Mother 31   Colon cancer Neg Hx        no definate prostate CA     Objective:  Blood pressure 100/60, pulse 98, height 5' 8 (1.727 m), weight 200 lb (90.7 kg), SpO2 98%. General: No acute distress.  Patient appears well-groomed.  Bradyphrenic, hypophonia.  Head:  Normocephalic/atraumatic Eyes:  fundi examined but not visualized Neck: supple, no paraspinal tenderness, full range of motion Heart: regular rate and rhythm Neurological Exam: Mental status: alert and oriented to person, place, and time, speech fluent and not dysarthric, language intact. Cranial nerves: CN I: not tested CN II: pupils equal, round and reactive to light, visual fields intact CN III, IV, VI:  full range of motion, no nystagmus, no ptosis CN V: facial sensation intact. CN VII: upper and lower face symmetric CN VIII: hearing intact CN IX, X: gag intact, uvula midline CN XI: sternocleidomastoid and trapezius muscles intact CN XII: tongue midline Bulk & Tone: Increased tone  Motor:  muscle strength 5-/5 upper extremities and 4+/5 lower extremities.  Postural tremor in hands.  Sensation:  Light touch sensation intact. Deep Tendon Reflexes:  2+ throughout,  toes downgoing.   Finger to nose testing:  Without dysmetria.    Gait:  Deferred.  In wheelchair.      Thank you for allowing me to take part in the care of this patient.  Juliene Dunnings, DO  CC: Debby Molt, MD

## 2023-11-22 ENCOUNTER — Ambulatory Visit: Admitting: Neurology

## 2023-11-22 ENCOUNTER — Encounter: Payer: Self-pay | Admitting: Neurology

## 2023-11-22 VITALS — BP 100/60 | HR 98 | Ht 68.0 in | Wt 200.0 lb

## 2023-11-22 DIAGNOSIS — G9340 Encephalopathy, unspecified: Secondary | ICD-10-CM

## 2023-11-22 NOTE — Patient Instructions (Signed)
 At this point, I would continue to monitor and see how he progresses.  Any problems, contact me

## 2023-11-22 NOTE — Progress Notes (Signed)
 Spoke to the [employer] at [place] center to go over patient medication.  Any changes in medication at the visit please let her know

## 2023-11-23 ENCOUNTER — Ambulatory Visit: Payer: Self-pay | Admitting: Internal Medicine

## 2023-11-23 ENCOUNTER — Ambulatory Visit: Payer: Medicare PPO

## 2023-11-23 DIAGNOSIS — I130 Hypertensive heart and chronic kidney disease with heart failure and stage 1 through stage 4 chronic kidney disease, or unspecified chronic kidney disease: Secondary | ICD-10-CM | POA: Diagnosis not present

## 2023-11-23 DIAGNOSIS — G9349 Other encephalopathy: Secondary | ICD-10-CM | POA: Diagnosis not present

## 2023-11-23 DIAGNOSIS — I4891 Unspecified atrial fibrillation: Secondary | ICD-10-CM | POA: Diagnosis not present

## 2023-11-23 DIAGNOSIS — Z794 Long term (current) use of insulin: Secondary | ICD-10-CM | POA: Diagnosis not present

## 2023-11-23 DIAGNOSIS — I442 Atrioventricular block, complete: Secondary | ICD-10-CM | POA: Diagnosis not present

## 2023-11-23 DIAGNOSIS — Z7901 Long term (current) use of anticoagulants: Secondary | ICD-10-CM | POA: Diagnosis not present

## 2023-11-23 DIAGNOSIS — E1122 Type 2 diabetes mellitus with diabetic chronic kidney disease: Secondary | ICD-10-CM | POA: Diagnosis not present

## 2023-11-23 LAB — CUP PACEART REMOTE DEVICE CHECK
Battery Remaining Longevity: 19 mo
Battery Remaining Percentage: 34 %
Battery Voltage: 2.95 V
Brady Statistic AP VP Percent: 2.5 %
Brady Statistic AP VS Percent: 1 %
Brady Statistic AS VP Percent: 97 %
Brady Statistic AS VS Percent: 1 %
Brady Statistic RA Percent Paced: 2 %
Brady Statistic RV Percent Paced: 99 %
Date Time Interrogation Session: 20250710020013
Implantable Lead Connection Status: 753985
Implantable Lead Connection Status: 753985
Implantable Lead Implant Date: 20111202
Implantable Lead Implant Date: 20111202
Implantable Lead Location: 753859
Implantable Lead Location: 753860
Implantable Pulse Generator Implant Date: 20200930
Lead Channel Impedance Value: 390 Ohm
Lead Channel Impedance Value: 440 Ohm
Lead Channel Pacing Threshold Amplitude: 0.75 V
Lead Channel Pacing Threshold Amplitude: 1.75 V
Lead Channel Pacing Threshold Pulse Width: 0.4 ms
Lead Channel Pacing Threshold Pulse Width: 0.8 ms
Lead Channel Sensing Intrinsic Amplitude: 2.2 mV
Lead Channel Sensing Intrinsic Amplitude: 5.2 mV
Lead Channel Setting Pacing Amplitude: 2 V
Lead Channel Setting Pacing Amplitude: 2 V
Lead Channel Setting Pacing Pulse Width: 0.8 ms
Lead Channel Setting Sensing Sensitivity: 4 mV
Pulse Gen Model: 2272
Pulse Gen Serial Number: 9156495

## 2023-11-24 DIAGNOSIS — M6281 Muscle weakness (generalized): Secondary | ICD-10-CM | POA: Diagnosis not present

## 2023-11-24 DIAGNOSIS — G9341 Metabolic encephalopathy: Secondary | ICD-10-CM | POA: Diagnosis not present

## 2023-11-24 DIAGNOSIS — I480 Paroxysmal atrial fibrillation: Secondary | ICD-10-CM | POA: Diagnosis not present

## 2023-11-24 DIAGNOSIS — R2689 Other abnormalities of gait and mobility: Secondary | ICD-10-CM | POA: Diagnosis not present

## 2023-11-28 DIAGNOSIS — R2689 Other abnormalities of gait and mobility: Secondary | ICD-10-CM | POA: Diagnosis not present

## 2023-11-28 DIAGNOSIS — M6281 Muscle weakness (generalized): Secondary | ICD-10-CM | POA: Diagnosis not present

## 2023-11-28 DIAGNOSIS — G9341 Metabolic encephalopathy: Secondary | ICD-10-CM | POA: Diagnosis not present

## 2023-11-28 DIAGNOSIS — I480 Paroxysmal atrial fibrillation: Secondary | ICD-10-CM | POA: Diagnosis not present

## 2023-11-30 DIAGNOSIS — G9349 Other encephalopathy: Secondary | ICD-10-CM | POA: Diagnosis not present

## 2023-11-30 DIAGNOSIS — I130 Hypertensive heart and chronic kidney disease with heart failure and stage 1 through stage 4 chronic kidney disease, or unspecified chronic kidney disease: Secondary | ICD-10-CM | POA: Diagnosis not present

## 2023-11-30 DIAGNOSIS — E1122 Type 2 diabetes mellitus with diabetic chronic kidney disease: Secondary | ICD-10-CM | POA: Diagnosis not present

## 2023-11-30 DIAGNOSIS — I4891 Unspecified atrial fibrillation: Secondary | ICD-10-CM | POA: Diagnosis not present

## 2023-12-01 DIAGNOSIS — G9341 Metabolic encephalopathy: Secondary | ICD-10-CM | POA: Diagnosis not present

## 2023-12-01 DIAGNOSIS — R2689 Other abnormalities of gait and mobility: Secondary | ICD-10-CM | POA: Diagnosis not present

## 2023-12-01 DIAGNOSIS — I480 Paroxysmal atrial fibrillation: Secondary | ICD-10-CM | POA: Diagnosis not present

## 2023-12-01 DIAGNOSIS — M6281 Muscle weakness (generalized): Secondary | ICD-10-CM | POA: Diagnosis not present

## 2023-12-05 DIAGNOSIS — M6281 Muscle weakness (generalized): Secondary | ICD-10-CM | POA: Diagnosis not present

## 2023-12-05 DIAGNOSIS — G9341 Metabolic encephalopathy: Secondary | ICD-10-CM | POA: Diagnosis not present

## 2023-12-05 DIAGNOSIS — R2689 Other abnormalities of gait and mobility: Secondary | ICD-10-CM | POA: Diagnosis not present

## 2023-12-05 DIAGNOSIS — I480 Paroxysmal atrial fibrillation: Secondary | ICD-10-CM | POA: Diagnosis not present

## 2023-12-08 DIAGNOSIS — I480 Paroxysmal atrial fibrillation: Secondary | ICD-10-CM | POA: Diagnosis not present

## 2023-12-08 DIAGNOSIS — G9341 Metabolic encephalopathy: Secondary | ICD-10-CM | POA: Diagnosis not present

## 2023-12-08 DIAGNOSIS — M6281 Muscle weakness (generalized): Secondary | ICD-10-CM | POA: Diagnosis not present

## 2023-12-08 DIAGNOSIS — R2689 Other abnormalities of gait and mobility: Secondary | ICD-10-CM | POA: Diagnosis not present

## 2023-12-11 DIAGNOSIS — I130 Hypertensive heart and chronic kidney disease with heart failure and stage 1 through stage 4 chronic kidney disease, or unspecified chronic kidney disease: Secondary | ICD-10-CM | POA: Diagnosis not present

## 2023-12-11 DIAGNOSIS — E1122 Type 2 diabetes mellitus with diabetic chronic kidney disease: Secondary | ICD-10-CM | POA: Diagnosis not present

## 2023-12-11 DIAGNOSIS — G9349 Other encephalopathy: Secondary | ICD-10-CM | POA: Diagnosis not present

## 2023-12-11 DIAGNOSIS — I4891 Unspecified atrial fibrillation: Secondary | ICD-10-CM | POA: Diagnosis not present

## 2023-12-13 DIAGNOSIS — M109 Gout, unspecified: Secondary | ICD-10-CM | POA: Diagnosis not present

## 2023-12-13 DIAGNOSIS — M6281 Muscle weakness (generalized): Secondary | ICD-10-CM | POA: Diagnosis not present

## 2023-12-13 DIAGNOSIS — E119 Type 2 diabetes mellitus without complications: Secondary | ICD-10-CM | POA: Diagnosis not present

## 2023-12-13 DIAGNOSIS — R41841 Cognitive communication deficit: Secondary | ICD-10-CM | POA: Diagnosis not present

## 2023-12-13 DIAGNOSIS — Z741 Need for assistance with personal care: Secondary | ICD-10-CM | POA: Diagnosis not present

## 2023-12-13 DIAGNOSIS — I259 Chronic ischemic heart disease, unspecified: Secondary | ICD-10-CM | POA: Diagnosis not present

## 2023-12-13 DIAGNOSIS — G9341 Metabolic encephalopathy: Secondary | ICD-10-CM | POA: Diagnosis not present

## 2023-12-13 DIAGNOSIS — R1312 Dysphagia, oropharyngeal phase: Secondary | ICD-10-CM | POA: Diagnosis not present

## 2023-12-13 DIAGNOSIS — R2689 Other abnormalities of gait and mobility: Secondary | ICD-10-CM | POA: Diagnosis not present

## 2023-12-13 DIAGNOSIS — M6259 Muscle wasting and atrophy, not elsewhere classified, multiple sites: Secondary | ICD-10-CM | POA: Diagnosis not present

## 2023-12-13 DIAGNOSIS — I509 Heart failure, unspecified: Secondary | ICD-10-CM | POA: Diagnosis not present

## 2023-12-18 DIAGNOSIS — N401 Enlarged prostate with lower urinary tract symptoms: Secondary | ICD-10-CM | POA: Diagnosis not present

## 2023-12-18 DIAGNOSIS — E559 Vitamin D deficiency, unspecified: Secondary | ICD-10-CM | POA: Diagnosis not present

## 2023-12-18 DIAGNOSIS — E782 Mixed hyperlipidemia: Secondary | ICD-10-CM | POA: Diagnosis not present

## 2023-12-18 DIAGNOSIS — D519 Vitamin B12 deficiency anemia, unspecified: Secondary | ICD-10-CM | POA: Diagnosis not present

## 2023-12-18 DIAGNOSIS — I10 Essential (primary) hypertension: Secondary | ICD-10-CM | POA: Diagnosis not present

## 2023-12-18 DIAGNOSIS — E039 Hypothyroidism, unspecified: Secondary | ICD-10-CM | POA: Diagnosis not present

## 2023-12-22 DIAGNOSIS — R262 Difficulty in walking, not elsewhere classified: Secondary | ICD-10-CM | POA: Diagnosis not present

## 2023-12-22 DIAGNOSIS — M62512 Muscle wasting and atrophy, not elsewhere classified, left shoulder: Secondary | ICD-10-CM | POA: Diagnosis not present

## 2023-12-22 DIAGNOSIS — M109 Gout, unspecified: Secondary | ICD-10-CM | POA: Diagnosis not present

## 2023-12-22 DIAGNOSIS — R296 Repeated falls: Secondary | ICD-10-CM | POA: Diagnosis not present

## 2023-12-22 DIAGNOSIS — G9341 Metabolic encephalopathy: Secondary | ICD-10-CM | POA: Diagnosis not present

## 2023-12-22 DIAGNOSIS — R278 Other lack of coordination: Secondary | ICD-10-CM | POA: Diagnosis not present

## 2023-12-22 DIAGNOSIS — M62511 Muscle wasting and atrophy, not elsewhere classified, right shoulder: Secondary | ICD-10-CM | POA: Diagnosis not present

## 2023-12-22 DIAGNOSIS — M6259 Muscle wasting and atrophy, not elsewhere classified, multiple sites: Secondary | ICD-10-CM | POA: Diagnosis not present

## 2023-12-22 DIAGNOSIS — M6281 Muscle weakness (generalized): Secondary | ICD-10-CM | POA: Diagnosis not present

## 2023-12-25 DIAGNOSIS — R278 Other lack of coordination: Secondary | ICD-10-CM | POA: Diagnosis not present

## 2023-12-25 DIAGNOSIS — R2681 Unsteadiness on feet: Secondary | ICD-10-CM | POA: Diagnosis not present

## 2023-12-25 DIAGNOSIS — R262 Difficulty in walking, not elsewhere classified: Secondary | ICD-10-CM | POA: Diagnosis not present

## 2023-12-26 ENCOUNTER — Other Ambulatory Visit: Payer: Self-pay

## 2023-12-26 ENCOUNTER — Encounter (HOSPITAL_COMMUNITY): Payer: Self-pay

## 2023-12-26 ENCOUNTER — Inpatient Hospital Stay (HOSPITAL_COMMUNITY)
Admission: RE | Admit: 2023-12-26 | Discharge: 2023-12-29 | DRG: 286 | Disposition: A | Discharge status: Skilled Nursing Facility | Attending: Internal Medicine | Admitting: Internal Medicine

## 2023-12-26 ENCOUNTER — Emergency Department (HOSPITAL_COMMUNITY)

## 2023-12-26 DIAGNOSIS — E162 Hypoglycemia, unspecified: Secondary | ICD-10-CM | POA: Diagnosis present

## 2023-12-26 DIAGNOSIS — R5383 Other fatigue: Secondary | ICD-10-CM | POA: Diagnosis not present

## 2023-12-26 DIAGNOSIS — I5043 Acute on chronic combined systolic (congestive) and diastolic (congestive) heart failure: Secondary | ICD-10-CM | POA: Diagnosis present

## 2023-12-26 DIAGNOSIS — Z96641 Presence of right artificial hip joint: Secondary | ICD-10-CM | POA: Diagnosis present

## 2023-12-26 DIAGNOSIS — Z833 Family history of diabetes mellitus: Secondary | ICD-10-CM

## 2023-12-26 DIAGNOSIS — N1832 Chronic kidney disease, stage 3b: Secondary | ICD-10-CM | POA: Diagnosis present

## 2023-12-26 DIAGNOSIS — I442 Atrioventricular block, complete: Secondary | ICD-10-CM | POA: Diagnosis present

## 2023-12-26 DIAGNOSIS — I480 Paroxysmal atrial fibrillation: Secondary | ICD-10-CM | POA: Diagnosis present

## 2023-12-26 DIAGNOSIS — Z7401 Bed confinement status: Secondary | ICD-10-CM | POA: Diagnosis not present

## 2023-12-26 DIAGNOSIS — M4312 Spondylolisthesis, cervical region: Secondary | ICD-10-CM | POA: Diagnosis not present

## 2023-12-26 DIAGNOSIS — D649 Anemia, unspecified: Secondary | ICD-10-CM | POA: Diagnosis present

## 2023-12-26 DIAGNOSIS — E11649 Type 2 diabetes mellitus with hypoglycemia without coma: Secondary | ICD-10-CM | POA: Diagnosis not present

## 2023-12-26 DIAGNOSIS — Z91013 Allergy to seafood: Secondary | ICD-10-CM | POA: Diagnosis not present

## 2023-12-26 DIAGNOSIS — M25532 Pain in left wrist: Secondary | ICD-10-CM | POA: Diagnosis present

## 2023-12-26 DIAGNOSIS — M1909 Primary osteoarthritis, other specified site: Secondary | ICD-10-CM | POA: Diagnosis not present

## 2023-12-26 DIAGNOSIS — Z794 Long term (current) use of insulin: Secondary | ICD-10-CM | POA: Diagnosis not present

## 2023-12-26 DIAGNOSIS — E10649 Type 1 diabetes mellitus with hypoglycemia without coma: Secondary | ICD-10-CM | POA: Diagnosis present

## 2023-12-26 DIAGNOSIS — Z7901 Long term (current) use of anticoagulants: Secondary | ICD-10-CM

## 2023-12-26 DIAGNOSIS — I5033 Acute on chronic diastolic (congestive) heart failure: Secondary | ICD-10-CM | POA: Diagnosis not present

## 2023-12-26 DIAGNOSIS — I429 Cardiomyopathy, unspecified: Secondary | ICD-10-CM | POA: Diagnosis present

## 2023-12-26 DIAGNOSIS — Z950 Presence of cardiac pacemaker: Secondary | ICD-10-CM | POA: Diagnosis not present

## 2023-12-26 DIAGNOSIS — J9811 Atelectasis: Secondary | ICD-10-CM | POA: Diagnosis present

## 2023-12-26 DIAGNOSIS — W19XXXA Unspecified fall, initial encounter: Secondary | ICD-10-CM

## 2023-12-26 DIAGNOSIS — N179 Acute kidney failure, unspecified: Secondary | ICD-10-CM | POA: Diagnosis present

## 2023-12-26 DIAGNOSIS — M1812 Unilateral primary osteoarthritis of first carpometacarpal joint, left hand: Secondary | ICD-10-CM | POA: Diagnosis not present

## 2023-12-26 DIAGNOSIS — I130 Hypertensive heart and chronic kidney disease with heart failure and stage 1 through stage 4 chronic kidney disease, or unspecified chronic kidney disease: Secondary | ICD-10-CM | POA: Diagnosis present

## 2023-12-26 DIAGNOSIS — R140 Abdominal distension (gaseous): Secondary | ICD-10-CM | POA: Diagnosis not present

## 2023-12-26 DIAGNOSIS — I509 Heart failure, unspecified: Secondary | ICD-10-CM | POA: Diagnosis not present

## 2023-12-26 DIAGNOSIS — I10 Essential (primary) hypertension: Secondary | ICD-10-CM | POA: Diagnosis present

## 2023-12-26 DIAGNOSIS — R509 Fever, unspecified: Secondary | ICD-10-CM | POA: Diagnosis not present

## 2023-12-26 DIAGNOSIS — Z8249 Family history of ischemic heart disease and other diseases of the circulatory system: Secondary | ICD-10-CM | POA: Diagnosis not present

## 2023-12-26 DIAGNOSIS — Z823 Family history of stroke: Secondary | ICD-10-CM

## 2023-12-26 DIAGNOSIS — M5030 Other cervical disc degeneration, unspecified cervical region: Secondary | ICD-10-CM | POA: Diagnosis not present

## 2023-12-26 DIAGNOSIS — J9601 Acute respiratory failure with hypoxia: Secondary | ICD-10-CM | POA: Diagnosis present

## 2023-12-26 DIAGNOSIS — W06XXXA Fall from bed, initial encounter: Secondary | ICD-10-CM | POA: Diagnosis present

## 2023-12-26 DIAGNOSIS — Z79899 Other long term (current) drug therapy: Secondary | ICD-10-CM | POA: Diagnosis not present

## 2023-12-26 DIAGNOSIS — E66811 Obesity, class 1: Secondary | ICD-10-CM | POA: Diagnosis present

## 2023-12-26 DIAGNOSIS — E1022 Type 1 diabetes mellitus with diabetic chronic kidney disease: Secondary | ICD-10-CM | POA: Diagnosis present

## 2023-12-26 DIAGNOSIS — F0390 Unspecified dementia without behavioral disturbance: Secondary | ICD-10-CM | POA: Diagnosis present

## 2023-12-26 DIAGNOSIS — N189 Chronic kidney disease, unspecified: Secondary | ICD-10-CM

## 2023-12-26 DIAGNOSIS — Z825 Family history of asthma and other chronic lower respiratory diseases: Secondary | ICD-10-CM

## 2023-12-26 DIAGNOSIS — I5031 Acute diastolic (congestive) heart failure: Secondary | ICD-10-CM | POA: Diagnosis not present

## 2023-12-26 DIAGNOSIS — R220 Localized swelling, mass and lump, head: Secondary | ICD-10-CM | POA: Diagnosis not present

## 2023-12-26 DIAGNOSIS — Z83438 Family history of other disorder of lipoprotein metabolism and other lipidemia: Secondary | ICD-10-CM

## 2023-12-26 DIAGNOSIS — R531 Weakness: Secondary | ICD-10-CM | POA: Diagnosis not present

## 2023-12-26 DIAGNOSIS — K029 Dental caries, unspecified: Secondary | ICD-10-CM | POA: Diagnosis not present

## 2023-12-26 DIAGNOSIS — S0990XA Unspecified injury of head, initial encounter: Secondary | ICD-10-CM | POA: Diagnosis not present

## 2023-12-26 DIAGNOSIS — Z6828 Body mass index (BMI) 28.0-28.9, adult: Secondary | ICD-10-CM

## 2023-12-26 DIAGNOSIS — J90 Pleural effusion, not elsewhere classified: Secondary | ICD-10-CM | POA: Diagnosis not present

## 2023-12-26 DIAGNOSIS — I110 Hypertensive heart disease with heart failure: Secondary | ICD-10-CM | POA: Diagnosis not present

## 2023-12-26 DIAGNOSIS — I5023 Acute on chronic systolic (congestive) heart failure: Secondary | ICD-10-CM | POA: Diagnosis not present

## 2023-12-26 DIAGNOSIS — E785 Hyperlipidemia, unspecified: Secondary | ICD-10-CM | POA: Diagnosis present

## 2023-12-26 DIAGNOSIS — R918 Other nonspecific abnormal finding of lung field: Secondary | ICD-10-CM | POA: Diagnosis not present

## 2023-12-26 DIAGNOSIS — E1159 Type 2 diabetes mellitus with other circulatory complications: Secondary | ICD-10-CM | POA: Diagnosis present

## 2023-12-26 DIAGNOSIS — S6992XA Unspecified injury of left wrist, hand and finger(s), initial encounter: Secondary | ICD-10-CM | POA: Diagnosis not present

## 2023-12-26 LAB — CBG MONITORING, ED
Glucose-Capillary: 53 mg/dL — ABNORMAL LOW (ref 70–99)
Glucose-Capillary: 140 mg/dL — ABNORMAL HIGH (ref 70–99)
Glucose-Capillary: 104 mg/dL — ABNORMAL HIGH (ref 70–99)
Glucose-Capillary: 104 mg/dL — ABNORMAL HIGH (ref 70–99)

## 2023-12-26 LAB — URINALYSIS, ROUTINE W REFLEX MICROSCOPIC
Bilirubin Urine: NEGATIVE
Glucose, UA: 50 mg/dL — AB
Hgb urine dipstick: NEGATIVE
Ketones, ur: NEGATIVE mg/dL
Leukocytes,Ua: NEGATIVE
Nitrite: NEGATIVE
Protein, ur: >=300 mg/dL — AB
Specific Gravity, Urine: 1.007 (ref 1.005–1.030)
pH: 5 (ref 5.0–8.0)

## 2023-12-26 LAB — CBC
HCT: 39.2 % (ref 39.0–52.0)
Hemoglobin: 12.8 g/dL — ABNORMAL LOW (ref 13.0–17.0)
MCH: 30.1 pg (ref 26.0–34.0)
MCHC: 32.7 g/dL (ref 30.0–36.0)
MCV: 92.2 fL (ref 80.0–100.0)
Platelets: 234 K/uL (ref 150–400)
RBC: 4.25 MIL/uL (ref 4.22–5.81)
RDW: 16.7 % — ABNORMAL HIGH (ref 11.5–15.5)
WBC: 6.2 K/uL (ref 4.0–10.5)
nRBC: 0 % (ref 0.0–0.2)

## 2023-12-26 LAB — PROTIME-INR
INR: 1.1 (ref 0.8–1.2)
Prothrombin Time: 14.8 s (ref 11.4–15.2)

## 2023-12-26 LAB — COMPREHENSIVE METABOLIC PANEL WITH GFR
ALT: 12 U/L (ref 0–44)
AST: 25 U/L (ref 15–41)
Albumin: 2.7 g/dL — ABNORMAL LOW (ref 3.5–5.0)
Alkaline Phosphatase: 77 U/L (ref 38–126)
Anion gap: 8 (ref 5–15)
BUN: 25 mg/dL — ABNORMAL HIGH (ref 8–23)
CO2: 23 mmol/L (ref 22–32)
Calcium: 8.9 mg/dL (ref 8.9–10.3)
Chloride: 106 mmol/L (ref 98–111)
Creatinine, Ser: 1.9 mg/dL — ABNORMAL HIGH (ref 0.61–1.24)
GFR, Estimated: 34 mL/min — ABNORMAL LOW (ref >60)
Glucose, Bld: 57 mg/dL — ABNORMAL LOW (ref 70–99)
Potassium: 4.6 mmol/L (ref 3.5–5.1)
Sodium: 137 mmol/L (ref 135–145)
Total Bilirubin: 1 mg/dL (ref 0.0–1.2)
Total Protein: 6.4 g/dL — ABNORMAL LOW (ref 6.5–8.1)

## 2023-12-26 LAB — I-STAT CHEM 8, ED
BUN: 32 mg/dL — ABNORMAL HIGH (ref 8–23)
Calcium, Ion: 1.04 mmol/L — ABNORMAL LOW (ref 1.15–1.40)
Chloride: 106 mmol/L (ref 98–111)
Creatinine, Ser: 2 mg/dL — ABNORMAL HIGH (ref 0.61–1.24)
Glucose, Bld: 55 mg/dL — ABNORMAL LOW (ref 70–99)
HCT: 39 % (ref 39.0–52.0)
Hemoglobin: 13.3 g/dL (ref 13.0–17.0)
Potassium: 4.7 mmol/L (ref 3.5–5.1)
Sodium: 137 mmol/L (ref 135–145)
TCO2: 24 mmol/L (ref 22–32)

## 2023-12-26 LAB — GLUCOSE, CAPILLARY
Glucose-Capillary: 249 mg/dL — ABNORMAL HIGH (ref 70–99)
Glucose-Capillary: 210 mg/dL — ABNORMAL HIGH (ref 70–99)

## 2023-12-26 LAB — SAMPLE TO BLOOD BANK

## 2023-12-26 LAB — BRAIN NATRIURETIC PEPTIDE: B Natriuretic Peptide: 248.8 pg/mL — ABNORMAL HIGH (ref 0.0–100.0)

## 2023-12-26 LAB — ETHANOL: Alcohol, Ethyl (B): <15 mg/dL (ref <15)

## 2023-12-26 LAB — I-STAT CG4 LACTIC ACID, ED: Lactic Acid, Venous: 1.1 mmol/L (ref 0.5–1.9)

## 2023-12-26 MED ORDER — ALLOPURINOL 100 MG PO TABS
100.0000 mg | ORAL_TABLET | Freq: Every day | ORAL | Status: DC
  Administered 2023-12-27 – 2023-12-29 (×4): 100 mg via ORAL
  Filled 2023-12-26 (×3): qty 1

## 2023-12-26 MED ORDER — FOLIC ACID 1 MG PO TABS
1.0000 mg | ORAL_TABLET | Freq: Every day | ORAL | Status: DC
  Administered 2023-12-27 – 2023-12-29 (×4): 1 mg via ORAL
  Filled 2023-12-26 (×3): qty 1

## 2023-12-26 MED ORDER — ONDANSETRON HCL 4 MG PO TABS: 4.0000 mg | ORAL_TABLET | Freq: Four times a day (QID) | ORAL | Status: DC | PRN

## 2023-12-26 MED ORDER — ONDANSETRON HCL 4 MG/2ML IJ SOLN: 4.0000 mg | Freq: Four times a day (QID) | INTRAMUSCULAR | Status: DC | PRN

## 2023-12-26 MED ORDER — DEXTROSE 50 % IV SOLN
50.0000 mL | Freq: Once | INTRAVENOUS | Status: AC
  Administered 2023-12-26 (×2): 50 mL via INTRAVENOUS
  Filled 2023-12-26: qty 50

## 2023-12-26 MED ORDER — INSULIN ASPART 100 UNIT/ML IJ SOLN
0.0000 [IU] | Freq: Every day | INTRAMUSCULAR | Status: DC
  Administered 2023-12-26 (×2): 2 [IU] via SUBCUTANEOUS
  Administered 2023-12-27 (×2): 3 [IU] via SUBCUTANEOUS

## 2023-12-26 MED ORDER — MELATONIN 3 MG PO TABS
3.0000 mg | ORAL_TABLET | Freq: Once | ORAL | Status: AC
  Administered 2023-12-26 (×2): 3 mg via ORAL
  Filled 2023-12-26: qty 1

## 2023-12-26 MED ORDER — LORATADINE 10 MG PO TABS: 10.0000 mg | ORAL_TABLET | Freq: Every day | ORAL | Status: DC | PRN

## 2023-12-26 MED ORDER — ACETAMINOPHEN 325 MG PO TABS
650.0000 mg | ORAL_TABLET | Freq: Four times a day (QID) | ORAL | Status: DC | PRN
  Administered 2023-12-27 (×2): 650 mg via ORAL
  Filled 2023-12-26: qty 2

## 2023-12-26 MED ORDER — SENNOSIDES-DOCUSATE SODIUM 8.6-50 MG PO TABS: 1.0000 | ORAL_TABLET | Freq: Every evening | ORAL | Status: DC | PRN

## 2023-12-26 MED ORDER — INSULIN ASPART 100 UNIT/ML IJ SOLN
0.0000 [IU] | Freq: Three times a day (TID) | INTRAMUSCULAR | Status: DC
  Administered 2023-12-26 – 2023-12-27 (×3): 5 [IU] via SUBCUTANEOUS
  Administered 2023-12-27: 3 [IU] via SUBCUTANEOUS
  Administered 2023-12-27: 5 [IU] via SUBCUTANEOUS
  Administered 2023-12-27: 3 [IU] via SUBCUTANEOUS
  Administered 2023-12-27 – 2023-12-28 (×3): 8 [IU] via SUBCUTANEOUS
  Administered 2023-12-28: 5 [IU] via SUBCUTANEOUS
  Administered 2023-12-28: 11 [IU] via SUBCUTANEOUS
  Administered 2023-12-29: 8 [IU] via SUBCUTANEOUS
  Administered 2023-12-29: 5 [IU] via SUBCUTANEOUS
  Administered 2023-12-29: 8 [IU] via SUBCUTANEOUS

## 2023-12-26 MED ORDER — METOPROLOL TARTRATE 12.5 MG HALF TABLET
12.5000 mg | ORAL_TABLET | Freq: Two times a day (BID) | ORAL | Status: DC
Start: 2023-12-26 — End: 2023-12-27
  Administered 2023-12-26 (×4): 12.5 mg via ORAL
  Filled 2023-12-26 (×3): qty 1

## 2023-12-26 MED ORDER — APIXABAN 2.5 MG PO TABS
2.5000 mg | ORAL_TABLET | Freq: Two times a day (BID) | ORAL | Status: DC
  Administered 2023-12-26 – 2023-12-29 (×11): 2.5 mg via ORAL
  Filled 2023-12-26 (×7): qty 1

## 2023-12-26 MED ORDER — FUROSEMIDE 10 MG/ML IJ SOLN
40.0000 mg | Freq: Two times a day (BID) | INTRAMUSCULAR | Status: DC
  Administered 2023-12-26 – 2023-12-29 (×9): 40 mg via INTRAVENOUS
  Filled 2023-12-26 (×6): qty 4

## 2023-12-26 MED ORDER — ALBUTEROL SULFATE (2.5 MG/3ML) 0.083% IN NEBU: 2.5000 mg | INHALATION_SOLUTION | RESPIRATORY_TRACT | Status: DC | PRN

## 2023-12-26 MED ORDER — FUROSEMIDE 10 MG/ML IJ SOLN
40.0000 mg | Freq: Once | INTRAMUSCULAR | Status: AC
  Administered 2023-12-26 (×2): 40 mg via INTRAVENOUS
  Filled 2023-12-26: qty 4

## 2023-12-26 MED ORDER — AMLODIPINE BESYLATE 10 MG PO TABS
10.0000 mg | ORAL_TABLET | Freq: Every day | ORAL | Status: DC
  Administered 2023-12-27 – 2023-12-28 (×3): 10 mg via ORAL
  Filled 2023-12-26 (×2): qty 1

## 2023-12-26 MED ORDER — TAMSULOSIN HCL 0.4 MG PO CAPS
0.4000 mg | ORAL_CAPSULE | Freq: Every day | ORAL | Status: DC
  Administered 2023-12-26 – 2023-12-28 (×5): 0.4 mg via ORAL
  Filled 2023-12-26 (×4): qty 1

## 2023-12-26 MED ORDER — ACETAMINOPHEN 650 MG RE SUPP: 650.0000 mg | Freq: Four times a day (QID) | RECTAL | Status: DC | PRN

## 2023-12-26 NOTE — Progress Notes (Signed)
 Orthopedic Tech Progress Note Patient Details:  Connor Dixon 1934/07/11 [id number]  Patient ID: Connor Dixon, male   DOB: 1935/03/30, 89 y.o.   MRN: [id number] I attended trauma page. Chandra Dorn PARAS 12/26/2023, 7:12 AM

## 2023-12-26 NOTE — ED Notes (Signed)
 Pt sat 88% with a good pleth, placed pt on 3L now sating 96%

## 2023-12-26 NOTE — ED Notes (Signed)
 Pts goddaughter at bedside informed this nurse that patient does have a history of congestive heart failure and has not been taking his Lasix  at the facility. MD Dasie notified

## 2023-12-26 NOTE — ED Notes (Signed)
 CBG COMPLETED (104)

## 2023-12-26 NOTE — ED Provider Notes (Signed)
 [facility]ENC[facility] DEPT [facility] Emergency Department Provider Note MRN:  [id number]  Arrival date & time: 12/26/23     Chief Complaint   Fall on thinners History of Present Illness   Connor Dixon is a 89 y.o. year-old male with a history CKD, heart failure, pacemaker presenting to the emergency department with chief complaint of fall.  Fall while trying to get out of bed.  Patient explains that he simply tripped and fell.  Denies getting dizzy or lightheaded or passing out.  Facial trauma but no LOC, denies neck or back pain, no chest pain or shortness of breath, no abdominal pain, left wrist pain is another concern of his.  Review of Systems  A thorough review of systems was obtained and all systems are negative except as noted in the HPI and PMH.   Patient's Health History    Past Medical History:  Diagnosis Date   Acute on chronic diastolic congestive heart failure (HCC)    Arthus phenomenon    Atrioventricular block, complete (HCC)    Cardiac pacemaker in situ 07/22/2010   Chronic kidney disease, stage 3 (HCC)    Presumably from longstanding DM & HTN. Scr has gradually increased over the past 2 years (1.6 in 12/2010, 2 in 03/2011, and up to 2.1 in 01/2013)   DIABETES MELLITUS, TYPE I, ADULT ONSET 08/06/2007   DM (diabetes mellitus) (HCC)    adult onset    Essential hypertension, benign 08/06/2007   Very good BP control   History of second degree heart block    HLD (hyperlipidemia)    HYPERLIPIDEMIA 08/06/2007   Hypopotassemia    Morbid obesity (HCC)    Obesity    Other and unspecified angina pectoris    typical   Type I (juvenile type) diabetes mellitus without mention of complication, not stated as uncontrolled    (04/25/13 OV with Dr. Rayburn) = Poorly controlled & is working  w/Dr. Karlos to improve this. Most recent Hgb A1c was 8.2% but Mr. Cloe reports it was down to 7.6 last month.    Past Surgical History:  Procedure Laterality Date   CATARACT  EXTRACTION  8/08   [place]   PPM GENERATOR CHANGEOUT N/A 02/13/2019   Procedure: PPM GENERATOR CHANGEOUT;  Surgeon: Waddell Danelle ORN, MD;  Location: [facility] INVASIVE CV LAB;  Service: Cardiovascular;  Laterality: N/A;   PTVDP  12/11   PPM - [facility] [facility]   TOTAL HIP ARTHROPLASTY  04/05/06   right    Family History  Problem Relation Age of Onset   Asthma Father    Coronary artery disease Father    Diabetes Father    Hyperlipidemia Father    Stroke Father 76   Coronary artery disease Mother    Asthma Mother    Heart attack Mother 61   Colon cancer Neg Hx        no definate prostate CA     Social History   Socioeconomic History   Marital status: Widowed    Spouse name: Not on file   Number of children: 2   Years of education: Not on file   Highest education level: Not on file  Occupational History   Occupation: retired  Tobacco Use   Smoking status: Never   Smokeless tobacco: Never  Vaping Use   Vaping status: Never Used  Substance and Sexual Activity   Alcohol  use: No   Drug use: No   Sexual activity: Not Currently  Other Topics Concern   Not on  file  Social History Narrative   [employer] BA, 2 Master's degrees. [place] - for certification Education specialist.    Retired Animal nutritionist.   Active - travels.   Married - 1961; 2 sons (1962 and 20) and 5 grandchildren.        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    Social Drivers of Corporate investment banker Strain: Low Risk  (04/06/2023)   Overall Financial Resource Strain (CARDIA)    Difficulty of Paying Living Expenses: Not hard at all  Food Insecurity: Patient Unable To Answer (10/13/2023)   Hunger Vital Sign    Worried About Programme researcher, broadcasting/film/video in the Last Year: Patient unable to answer    Ran Out of Food in the Last Year: Patient unable to answer  Transportation Needs: Patient Unable To Answer (10/13/2023)   PRAPARE - Transportation    Lack of Transportation (Medical): Patient unable to  answer    Lack of Transportation (Non-Medical): Patient unable to answer  Physical Activity: Inactive (04/06/2023)   Exercise Vital Sign    Days of Exercise per Week: 0 days    Minutes of Exercise per Session: 0 min  Stress: No Stress Concern Present (04/06/2023)   [employer] of Occupational Health - Occupational Stress Questionnaire    Feeling of Stress : Not at all  Social Connections: Unknown (10/13/2023)   Social Connection and Isolation Panel    Frequency of Communication with Friends and Family: Patient unable to answer    Frequency of Social Gatherings with Friends and Family: Patient unable to answer    Attends Religious Services: More than 4 times per year    Active Member of Clubs or Organizations: Patient unable to answer    Attends Banker Meetings: Patient unable to answer    Marital Status: Widowed  Intimate Partner Violence: Patient Unable To Answer (10/13/2023)   Humiliation, Afraid, Rape, and Kick questionnaire    Fear of Current or Ex-Partner: Patient unable to answer    Emotionally Abused: Patient unable to answer    Physically Abused: Patient unable to answer    Sexually Abused: Patient unable to answer     Physical Exam   Vitals:   12/26/23 0649 12/26/23 0650  BP: 128/72   Pulse: 96   Resp: 18   Temp: 97.6 F (36.4 C)   SpO2: 92% 93%    CONSTITUTIONAL: Well-appearing, NAD NEURO/PSYCH:  Alert and oriented x 3, no focal deficits EYES:  eyes equal and reactive ENT/NECK:  no LAD, no JVD CARDIO: Regular rate, well-perfused, normal S1 and S2 PULM:  CTAB no wheezing or rhonchi GI/GU:  non-distended, non-tender MSK/SPINE:  No gross deformities, no edema SKIN: Bruising to the left periorbital region, tenderness and swelling to the left wrist   *Additional and/or pertinent findings included in MDM below  Diagnostic and Interventional Summary    EKG Interpretation Date/Time:    Ventricular Rate:    PR Interval:    QRS Duration:    QT  Interval:    QTC Calculation:   R Axis:      Text Interpretation:         Labs Reviewed  CBC - Abnormal; Notable for the following components:      Result Value   Hemoglobin 12.8 (*)    RDW 16.7 (*)    All other components within normal limits  CBG MONITORING, ED - Abnormal; Notable for the following components:   Glucose-Capillary 53 (*)  All other components within normal limits  I-STAT CHEM 8, ED - Abnormal; Notable for the following components:   BUN 32 (*)    Creatinine, Ser 2.00 (*)    Glucose, Bld 55 (*)    Calcium, Ion 1.04 (*)    All other components within normal limits  CULTURE, BLOOD (ROUTINE X 2)  CULTURE, BLOOD (ROUTINE X 2)  PROTIME-INR  COMPREHENSIVE METABOLIC PANEL WITH GFR  ETHANOL  URINALYSIS, ROUTINE W REFLEX MICROSCOPIC  I-STAT CG4 LACTIC ACID, ED  SAMPLE TO BLOOD BANK    DG Chest Port 1 View    (Results Pending)  CT HEAD WO CONTRAST    (Results Pending)  CT CERVICAL SPINE WO CONTRAST    (Results Pending)  DG Wrist Complete Left    (Results Pending)  CT MAXILLOFACIAL WO CONTRAST    (Results Pending)    Medications  dextrose  50 % solution 50 mL (50 mLs Intravenous Given 12/26/23 0704)     Procedures  /  Critical Care Procedures  ED Course and Medical Decision Making  Initial Impression and Ddx GLF with head trauma, patient endorses a mechanical fall and denies other symptoms.  Seems to have a somatic hypoglycemia with a blood sugar of 55.  Obtaining screening labs, denies any infectious symptoms.  Differential diagnosis includes intracranial bleeding, wrist fracture.  Past medical/surgical history that increases complexity of ED encounter: Anticoagulated  Interpretation of Diagnostics Labs, imaging pending Patient Reassessment and Ultimate Disposition/Management     Signed out to oncoming provider at shift change.  Patient management required discussion with the following services or consulting groups:  None  Complexity of Problems  Addressed Acute illness or injury that poses threat of life of bodily function  Additional Data Reviewed and Analyzed Further history obtained from: EMS on arrival  Additional Factors Impacting ED Encounter Risk Consideration of hospitalization  Ozell HERO. Theadore, MD [facility] Emergency Medicine [facility] [email or web address]  Final Clinical Impressions(s) / ED Diagnoses     ICD-10-CM   1. Hypoglycemia  E16.2     2. Fall, initial encounter  [email or web address].[email or web address]       ED Discharge Orders     None        Discharge Instructions Discussed with and Provided to Patient:   Discharge Instructions   None      Theadore Ozell HERO, MD 12/26/23 [phone]

## 2023-12-26 NOTE — ED Notes (Signed)
 Supervised NT [place] on Blood culture collection via straight stick

## 2023-12-26 NOTE — H&P (Addendum)
 History and Physical    Connor Dixon FMW:[id number] DOB: [birth year] DOA: 12/26/2023  PCP: [name], MD   Patient coming from: Home  I have personally briefly reviewed patient's old medical records in [facility]  Chief Complaint: Fall  HPI: Connor Dixon is a 89 y.o. male with medical history significant of chronic diastolic heart failure, complete AV block status post pacemaker, paroxysmal A-fib, CKD stage IIIb, diabetes mellitus type 2, hypertension, hyperlipidemia, suspected cognitive impairment and hospitalization from 10/10/2023-11/08/2023 for acute encephalopathy with no cause identified (MRI, LP negative) treated with IV steroids and subsequently IVIG.  He presented today with a fall.  He apparently tripped and fell while trying to get out of bed.  Denies dizziness, lightheadedness, loss of consciousness, neck or back pain, chest pain, shortness of breath, abdominal pain.  Complains of left wrist pain.  No fever, cough, abdominal pain, diarrhea, dysuria, seizures reported.  ED Course: His blood glucose was 53 initially for which he received IV dextrose .  Left wrist x-ray did not show any acute fracture or dislocation.  CT of the head, cervical spine and maxillofacial showed no acute intracranial abnormality but showed left infraorbital/premolar soft tissue hematoma with no underlying fracture and no cervical spine fracture.  Chest x-ray showed bilateral pleural effusions, new or increased since June.  His oxygen saturations dropped to the 80s for which he needed supplemental oxygen.  Creatinine was 1.9; BNP of 248.8 with WBC of 6.2.  He was given IV Lasix . Hospitalist service was called to evaluate the patient.  Review of Systems: As per HPI otherwise all other systems were reviewed and are negative.   Past Medical History:  Diagnosis Date   Acute on chronic diastolic congestive heart failure (HCC)    Arthus phenomenon    Atrioventricular block, complete (HCC)    Cardiac  pacemaker in situ 07/22/2010   Chronic kidney disease, stage 3 (HCC)    Presumably from longstanding DM & HTN. Scr has gradually increased over the past 2 years (1.6 in 12/2010, 2 in 03/2011, and up to 2.1 in 01/2013)   DIABETES MELLITUS, TYPE I, ADULT ONSET 08/06/2007   DM (diabetes mellitus) (HCC)    adult onset    Essential hypertension, benign 08/06/2007   Very good BP control   History of second degree heart block    HLD (hyperlipidemia)    HYPERLIPIDEMIA 08/06/2007   Hypopotassemia    Morbid obesity (HCC)    Obesity    Other and unspecified angina pectoris    typical   Type I (juvenile type) diabetes mellitus without mention of complication, not stated as uncontrolled    (04/25/13 OV with Dr. Rayburn) = Poorly controlled & is working  w/Dr. Karlos to improve this. Most recent Hgb A1c was 8.2% but Mr. Matousek reports it was down to 7.6 last month.    Past Surgical History:  Procedure Laterality Date   CATARACT EXTRACTION  8/08   [place]   PPM GENERATOR CHANGEOUT N/A 02/13/2019   Procedure: PPM GENERATOR CHANGEOUT;  Surgeon: Waddell Danelle ORN, MD;  Location: [facility] INVASIVE CV LAB;  Service: Cardiovascular;  Laterality: N/A;   PTVDP  12/11   PPM - [employer]   TOTAL HIP ARTHROPLASTY  04/05/06   right     reports that he has never smoked. He has never used smokeless tobacco. He reports that he does not drink alcohol  and does not use drugs.  Allergies  Allergen Reactions   Oysters [Shellfish Allergy] Hives and  Swelling   Citrus Hives   Pecan Extract Hives   Apple Juice Other (See Comments)    Sore throat   Farxiga  [Dapagliflozin ] Other (See Comments)    Low blood sugar    Family History  Problem Relation Age of Onset   Asthma Father    Coronary artery disease Father    Diabetes Father    Hyperlipidemia Father    Stroke Father 50   Coronary artery disease Mother    Asthma Mother    Heart attack Mother 66   Colon cancer Neg Hx        no definate prostate CA      Prior to Admission medications   Medication Sig Start Date End Date Taking? Authorizing Provider  acetaminophen  (TYLENOL ) 325 MG tablet Take 650 mg by mouth every 8 (eight) hours as needed for moderate pain (pain score 4-6).   Yes [provider]  allopurinol  (ZYLOPRIM ) 100 MG tablet TAKE 1 TABLET(100 MG) BY MOUTH DAILY 09/25/23  Yes [name], MD  amLODipine  (NORVASC ) 10 MG tablet Take 1 tablet (10 mg total) by mouth daily. 11/08/23  Yes Gherghe, Costin M, MD  apixaban  (ELIQUIS ) 2.5 MG TABS tablet Take 1 tablet (2.5 mg total) by mouth 2 (two) times daily. 08/14/23  Yes [name], MD  calcitRIOL  (ROCALTROL ) 0.25 MCG capsule TAKE 1 CAPSULE BY MOUTH DAILY RETURN IN ABOUT 6 MONTHS(AROUND 02/04/2023) 08/14/23  Yes [name], MD  Cholecalciferol (VITAMIN D3) 250 MCG (10000 UT) capsule Take 10,000 Units by mouth daily.   Yes [provider]  feeding supplement, GLUCERNA SHAKE, (GLUCERNA SHAKE) LIQD Take 237 mLs by mouth daily. Patient taking differently: Take 237 mLs by mouth 3 (three) times daily between meals. 11/07/23  Yes Trixie Nilda HERO, MD  ferrous sulfate  325 (65 FE) MG tablet Take 1 tablet (325 mg total) by mouth daily with breakfast. 11/08/23  Yes Gherghe, Costin M, MD  folic acid  (FOLVITE ) 1 MG tablet Take 1 tablet (1 mg total) by mouth daily. 11/08/23  Yes Gherghe, Costin M, MD  insulin  isophane & regular human KwikPen (HUMULIN  70/30 KWIKPEN) (70-30) 100 UNIT/ML KwikPen Inject 15 Units into the skin in the morning and at bedtime. 40 units in AM and 25 units in PM Patient taking differently: Inject 18-22 Units into the skin in the morning and at bedtime. 22 units in AM and 18 units in PM 11/08/23  Yes Danford, Lonni SQUIBB, MD  levocetirizine (XYZAL ) 5 MG tablet Take 1 tablet (5 mg total) by mouth every evening. 09/06/23  Yes [name], MD  loratadine  (CLARITIN ) 10 MG tablet Take 10 mg by mouth daily as needed for allergies.   Yes [provider]   metoprolol  tartrate (LOPRESSOR ) 25 MG tablet Take 0.5 tablets (12.5 mg total) by mouth 2 (two) times daily. 11/07/23  Yes Trixie Nilda HERO, MD  tamsulosin  (FLOMAX ) 0.4 MG CAPS capsule Take 1 capsule (0.4 mg total) by mouth daily. 11/08/23  Yes Gherghe, Nilda HERO, MD  Continuous Glucose Sensor (DEXCOM G7 SENSOR) MISC 1 Act by Does not apply route daily. Patient not taking: Reported on 11/22/2023 01/06/23   [name], MD  cyanocobalamin  1000 MCG tablet Take 1 tablet (1,000 mcg total) by mouth daily. Patient not taking: Reported on 12/26/2023 11/08/23   Trixie Nilda HERO, MD  Glucagon  (GVOKE HYPOPEN  2-PACK) 1 MG/0.2ML SOAJ Inject 1 Act into the skin daily as needed. Patient not taking: Reported on 12/26/2023 04/05/22   Joshua Debby  L, MD    Physical Exam: Vitals:   12/26/23 0650 12/26/23 0652 12/26/23 1000 12/26/23 1056  BP:   133/82   Pulse:   92   Resp:   20   Temp:    98.5 F (36.9 C)  TempSrc:      SpO2: 93%  94%   Weight:  90 kg    Height:  5' 8 (1.727 m)      Constitutional: NAD, calm, comfortable.  Looks chronically ill and deconditioned.  Elderly male lying in bed. Vitals:   12/26/23 0650 12/26/23 0652 12/26/23 1000 12/26/23 1056  BP:   133/82   Pulse:   92   Resp:   20   Temp:    98.5 F (36.9 C)  TempSrc:      SpO2: 93%  94%   Weight:  90 kg    Height:  5' 8 (1.727 m)     Eyes: PERRL, lids and conjunctivae normal ENMT: Mucous membranes are moist. Posterior pharynx clear of any exudate or lesions. Neck: normal, supple, no masses, no thyromegaly Respiratory: bilateral decreased breath sounds at bases, basilar crackles heard.  Normal respiratory effort. No accessory muscle use.  Cardiovascular: S1 S2 positive, rate controlled. No extremity edema. 2+ pedal pulses.  Abdomen: no tenderness, no masses palpated. No hepatosplenomegaly. Bowel sounds positive.  Musculoskeletal: no clubbing / cyanosis.  Left wrist tenderness and swelling present.   Skin: Bruising to the left  periorbital region.   Neurologic: CN 2-12 grossly intact. Moving extremities. No focal neurologic deficits.  Psychiatric: Flat affect.  Not agitated.   Labs on Admission: I have personally reviewed following labs and imaging studies  CBC: Recent Labs  Lab 12/26/23 0656 12/26/23 0659  WBC  --  6.2  HGB 13.3 12.8*  HCT 39.0 39.2  MCV  --  92.2  PLT  --  234   Basic Metabolic Panel: Recent Labs  Lab 12/26/23 0656 12/26/23 0659  NA 137 137  K 4.7 4.6  CL 106 106  CO2  --  23  GLUCOSE 55* 57*  BUN 32* 25*  CREATININE 2.00* 1.90*  CALCIUM  --  8.9   GFR: Estimated Creatinine Clearance: 30.4 mL/min (A) (by C-G formula based on SCr of 1.9 mg/dL (H)). Liver Function Tests: Recent Labs  Lab 12/26/23 0659  AST 25  ALT 12  ALKPHOS 77  BILITOT 1.0  PROT 6.4*  ALBUMIN 2.7*   No results for input(s): LIPASE, AMYLASE in the last 168 hours. No results for input(s): AMMONIA in the last 168 hours. Coagulation Profile: Recent Labs  Lab 12/26/23 0659  INR 1.1   Cardiac Enzymes: No results for input(s): CKTOTAL, CKMB, CKMBINDEX, TROPONINI in the last 168 hours. BNP (last 3 results) No results for input(s): PROBNP in the last 8760 hours. HbA1C: No results for input(s): HGBA1C in the last 72 hours. CBG: Recent Labs  Lab 12/26/23 0649 12/26/23 0730 12/26/23 0844  GLUCAP 53* 140* 104*   Lipid Profile: No results for input(s): CHOL, HDL, LDLCALC, TRIG, CHOLHDL, LDLDIRECT in the last 72 hours. Thyroid  Function Tests: No results for input(s): TSH, T4TOTAL, FREET4, T3FREE, THYROIDAB in the last 72 hours. Anemia Panel: No results for input(s): VITAMINB12, FOLATE, FERRITIN, TIBC, IRON, RETICCTPCT in the last 72 hours. Urine analysis:    Component Value Date/Time   COLORURINE YELLOW 12/26/2023 0815   APPEARANCEUR CLEAR 12/26/2023 0815   LABSPEC 1.007 12/26/2023 0815   PHURINE 5.0 12/26/2023 0815   GLUCOSEU 50 (A)  12/26/2023 0815   GLUCOSEU 100 (A) 05/08/2023 0833   HGBUR NEGATIVE 12/26/2023 0815   BILIRUBINUR NEGATIVE 12/26/2023 0815   KETONESUR NEGATIVE 12/26/2023 0815   PROTEINUR >=300 (A) 12/26/2023 0815   UROBILINOGEN 0.2 05/08/2023 0833   NITRITE NEGATIVE 12/26/2023 0815   LEUKOCYTESUR NEGATIVE 12/26/2023 0815    Radiological Exams on Admission: DG Wrist Complete Left Result Date: 12/26/2023 CLINICAL DATA:  89 year old male status post fall with wrist pain. EXAM: LEFT WRIST - COMPLETE 3+ VIEW COMPARISON:  None Available. FINDINGS: Four views 0804 hours. Calcified peripheral vascular disease. Distal radius and ulna appear intact. Radiocarpal joint space loss at the scaphoid, radial styloid. Subchondral sclerosis there. Mild widening of the scapholunate interval. Carpal bone alignment otherwise maintained. Proximal metacarpals intact. Less pronounced degeneration at the 1st [facility] joint. No acute fracture or dislocation identified. IMPRESSION: 1. No acute fracture or dislocation identified about the left wrist. But mild widening of the scapholunate interval, can be posttraumatic and predispose to SLAC wrist. 2. Radiocarpal and 1st [facility] joint degeneration. 3. Calcified peripheral vascular disease. Electronically Signed   By: VEAR Hurst M.D.   On: 12/26/2023 09:00   DG Chest Port 1 View Result Date: 12/26/2023 CLINICAL DATA:  89 year old male status post unwitnessed fall. On blood thinners. EXAM: PORTABLE CHEST 1 VIEW COMPARISON:  Cervical spine CT today. Portable chest 10/31/2023 and earlier. FINDINGS: Portable AP semi upright view at 0708 hours. Veiling bilateral lung opacity is new or increased since June. Stable lung volumes. Stable cardiac size and mediastinal contours. Stable left chest dual lead cardiac pacemaker. No pneumothorax. No air bronchograms. Dense retrocardiac opacity. Pulmonary vascularity is indistinct. Paucity of bowel gas in the visible abdomen. No acute osseous abnormality identified.  IMPRESSION: Bilateral pleural effusions, new or increased since June. Confluent bibasilar atelectasis suspected. Cannot exclude underlying interstitial edema. Electronically Signed   By: VEAR Hurst M.D.   On: 12/26/2023 08:04   CT MAXILLOFACIAL WO CONTRAST Result Date: 12/26/2023 CLINICAL DATA:  89 year old male status post unwitnessed fall. On blood thinners. EXAM: CT MAXILLOFACIAL WITHOUT CONTRAST TECHNIQUE: Multidetector CT imaging of the maxillofacial structures was performed. Multiplanar CT image reconstructions were also generated. RADIATION DOSE REDUCTION: This exam was performed according to the departmental dose-optimization program which includes automated exposure control, adjustment of the mA and/or kV according to patient size and/or use of iterative reconstruction technique. COMPARISON:  Head and cervical spine CT today. FINDINGS: Osseous: Chronic TMJ degeneration, but mandible appears intact and aligned. Chronic dental caries. Bilateral maxilla, zygoma, pterygoid and nasal bones appear intact. Visible skull base appears intact. Orbits: Right orbit not completely included, but visible on head CT today reported separately. Left orbital walls intact. Postoperative changes to both globes which appear intact. Intraorbital soft tissues appear symmetric and normal. Left premalar, inferior preseptal space soft tissue swelling and stranding redemonstrated. No soft tissue gas. Sinuses: Well aerated bilaterally. Soft tissues: Mild motion artifact of the larynx and pharynx. Negative visible noncontrast parapharyngeal spaces, retropharyngeal space, sublingual space, submandibular spaces, masticator and parotid spaces. Limited intracranial: Reported separately. IMPRESSION: 1. Left premalar, inferior preseptal soft tissue injury with hematoma. 2. No facial fracture identified. Electronically Signed   By: VEAR Hurst M.D.   On: 12/26/2023 08:02   CT CERVICAL SPINE WO CONTRAST Result Date: 12/26/2023 CLINICAL DATA:   89 year old male status post unwitnessed fall. On blood thinners. EXAM: CT CERVICAL SPINE WITHOUT CONTRAST TECHNIQUE: Multidetector CT imaging of the cervical spine was performed without intravenous contrast. Multiplanar CT image reconstructions were also generated.  RADIATION DOSE REDUCTION: This exam was performed according to the departmental dose-optimization program which includes automated exposure control, adjustment of the mA and/or kV according to patient size and/or use of iterative reconstruction technique. COMPARISON:  CT head and face today.  CTA neck 10/10/2023. FINDINGS: Alignment: Stable straightening of cervical lordosis. Mild dextroconvex rather than levoconvex cervical spine curvature now. Mild chronic and degenerative appearing cervical spine spondylolisthesis. Cervicothoracic junction alignment is within normal limits. Bilateral posterior element alignment is within normal limits. Skull base and vertebrae: Bone mineralization is within normal limits for age. Visualized skull base is intact. No atlanto-occipital dissociation. C1 and C2 appear chronically degenerated, but intact and aligned. No acute osseous abnormality identified. Soft tissues and spinal canal: No prevertebral fluid or swelling. No visible canal hematoma. Negative visible noncontrast neck soft tissues. Disc levels: Chronic cervical spine degeneration appears stable from the May CTA. Upper chest: Large pleural effusions visible in both lung apices, simple fluid density. Visible upper thoracic levels appear grossly stable and intact. IMPRESSION: 1. No acute traumatic injury identified in the cervical spine. Stable chronic cervical spine degeneration. 2. Large pleural effusions visible in both lung apices. Electronically Signed   By: VEAR Hurst M.D.   On: 12/26/2023 07:59   CT HEAD WO CONTRAST Result Date: 12/26/2023 CLINICAL DATA:  89 year old male status post unwitnessed fall. On blood thinners. EXAM: CT HEAD WITHOUT CONTRAST  TECHNIQUE: Contiguous axial images were obtained from the base of the skull through the vertex without intravenous contrast. RADIATION DOSE REDUCTION: This exam was performed according to the departmental dose-optimization program which includes automated exposure control, adjustment of the mA and/or kV according to patient size and/or use of iterative reconstruction technique. COMPARISON:  Brain MRI 10/17/2023 head CT 10/11/2023. FINDINGS: Brain: Stable cerebral volume. No midline shift, ventriculomegaly, mass effect, evidence of mass lesion, intracranial hemorrhage or evidence of cortically based acute infarction. Confluent bilateral periventricular white matter hypodensity, chronic lacunar infarcts in the right basal ganglia. Stable gray-white matter differentiation throughout the brain. Vascular: Calcified atherosclerosis at the skull base. No suspicious intracranial vascular hyperdensity. Skull: Stable.  No acute fracture identified. Sinuses/Orbits: Visualized paranasal sinuses and mastoids are stable and well aerated. Other: Left infraorbital, premalar soft tissue swelling and stranding in keeping with superficial hematoma/contusion series 4, image 11. Underlying left maxilla and zygoma appear stable and intact. Other orbit and scalp soft tissues appear stable. IMPRESSION: 1. Left infraorbital/premalar soft tissue hematoma. No underlying fracture is identified. 2. No acute intracranial abnormality. Stable CT appearance of chronic small vessel disease. Electronically Signed   By: VEAR Hurst M.D.   On: 12/26/2023 07:56    EKG: Independently reviewed.  No ST elevations or depressions noted.  Assessment/Plan  Possible acute on chronic diastolic heart failure Acute respiratory failure with hypoxia Bilateral pleural effusions - Presented with a fall and imaging showed bilateral pleural effusions.  Oxygen saturations dropped to the 80s on room air in the ED.  Placed on supplemental oxygen.  Wean off as  able. - Continue Lasix  40 mg IV every 12 hours.  Check 2D echo.  Will consult cardiology.  Follow recommendations.  Strict input and output.  Elevate.  Fluid restriction.  Continue metoprolol   Fall Left wrist pain - Possibly mechanical.  Imaging including CT of the head, cervical spine, maxillofacial negative for any fractures/dislocations.  Left wrist x-ray did not show any fracture or dislocation as well. -Fall precautions.  PT eval  CKD stage IIIb - Baseline creatinine of 1.6-1.8.  Creatinine currently stable.  Monitor.  Hypertension Hyperlipidemia - Continue amlodipine , metoprolol  and Lasix   Diabetes mellitus type 2 with hypoglycemia - Carb modified diet.  CBGs with SSI.  Paroxysmal A-fib - Currently rate controlled.  Continue Eliquis  and metoprolol   History of complete AV block status post pacemaker -Follow cardiology recommendations  Obesity class I - Outpatient follow-up   DVT prophylaxis: Eliquis  Code Status: Full Family Communication: Caregiver friend at bedside. Called son/Steven on phone but he didn't pick up Disposition Plan: Pending clinical improvement Consults called: Cardiology Admission status: Observation/telemetry  Severity of Illness: The appropriate patient status for this patient is OBSERVATION. Observation status is judged to be reasonable and necessary in order to provide the required intensity of service to ensure the patient's safety. The patient's presenting symptoms, physical exam findings, and initial radiographic and laboratory data in the context of their medical condition is felt to place them at decreased risk for further clinical deterioration. Furthermore, it is anticipated that the patient will be medically stable for discharge from the hospital within 2 midnights of admission.     Sophie Mao MD [facility]  12/26/2023, 11:31 AM

## 2023-12-26 NOTE — Progress Notes (Signed)
   12/26/23 0645  Spiritual Encounters  Type of Visit Initial  Care provided to: Pt not available  Referral source Trauma page  Reason for visit Trauma  OnCall Visit No   Chaplain responded to a level two trauma. No family is present. If a chaplain is requested someone will respond.   Carley Birmingham [facility]  [phone]

## 2023-12-26 NOTE — ED Provider Notes (Signed)
  Physical Exam  BP 133/82   Pulse 92   Temp 98.5 F (36.9 C)   Resp 20   Ht 1.727 m (5' 8)   Wt 90 kg   SpO2 94%   BMI 30.17 kg/m   Physical Exam  Procedures  Procedures  ED Course / MDM    Medical Decision Making Amount and/or Complexity of Data Reviewed Labs: ordered. Radiology: ordered. ECG/medicine tests: ordered.  Risk Prescription drug management.  Transient low blood sugar noted and repeat is above 100.  Imaging noted and no evidence of intracranial hemorrhage.  Wrist x-ray noted but patient clinically has no pains of pain to his wrist. Patient apparently has a history of CHF and has not been getting his Lasix .  His chest x-ray does show excessive fluid when compared to the prior one.  He was given 40 mg of Lasix  here.  He does have a history of stage III chronic kidney disease.  Creatinine today is 2.  Patient's O2 sat dropped to 86% and requires oxygen at this time.  He will need admission for observation.       Dasie Faden, MD 12/26/23 1116

## 2023-12-26 NOTE — Consult Note (Signed)
 Cardiology Consultation   Patient ID: Connor Dixon MRN: [id number]; DOB: [birth year]  Admit date: 12/26/2023 Date of Consult: 12/26/2023  PCP:  [name], MD   [facility] Providers Cardiologist:  None  Electrophysiologist:  Danelle Birmingham, MD    Patient Profile: Connor Dixon is a 89 y.o. male with a hx of hypertension, complete heart block s/p PPM, paroxysmal atrial fibrillation, diabetes, chronic HFpEF, hyperlipidemia, hypertension, CKD stage 3b, suspected cognitive impairment who is being seen 12/26/2023 for the evaluation of acute on chronic HFpEF at the request of Dr. Cheryle.  History of Present Illness: Connor Dixon has past medical history as stated above. He presented to [facility] ED on 12/26/2023 for a fall. He denied any prodrome and reported a mechanical fall, denied any LOC. He reported tripping while getting out of bed. He reported some associated left wrist pain.   Upon arrival to the ED, he was found to be hypoglycemic with blood glucose at 53, he was given IV dextrose .  Relevant workup while in ED includes: creatinine 2.00 > 1.90, CBC showed stable anemia, BNP 248, UA showed proteinuria. CXR showed bilateral pleural effusions, bibasilar atelectasis. CT head showed left infraorbital/premalar soft tissue hematoma, no fracture. No trauma noted on CT c-spine. Left wrist XR showed no acute fracture/dislocation.   Echo from 09/2023 showed: EF 60-65%, normal RV function, moderate MAC, normal IVC.   Last remote device check from 11/2023 showed: A-sensed, V-paced, normal device function.   He has been admitted to medicine service with cardiology asked to consult in the setting of acute on chronic HFpEF.   Since being admitted he has received IV Lasix  40 mg BID, continued on home amlodipine  10 mg daily, Lopressor  12.5 mg BID, he was also started on Eliquis  2.5 mg BID (appropriate given age and renal function).  Recently in the hospital from 5/27-6/25/2025 for acute  metabolic encephalopathy. He was treated with IV steroids and IVIG. CSF autoimmune panel was sent off to [facility] clinic. LP was negative for meningitis. He had reported cognitive impairment prior to coming to the hospital.   He is typically followed by our EP providers. Last seen by Dr. Birmingham 12/14/2022 for follow up. Everything appeared to be well controlled at this visit. His current medications included: low dose Eliquis , coreg  6.25 mg BID, PO Lasix  80 mg in AM, 40 mg in PM, Losartan  25 mg daily, pravastatin  40 mg daily.   I spoke and examined the patient, he is slightly confused but not currently agitated.  He tells me that he is frustrated from being in the hospital for so long.  I explained to him the situation that he is going through and he is very understanding.  I asked him how his memory has been doing since he was discharged from the hospital, he tells me it has not really been great.  He is not able to necessarily answer to many questions for me regarding this current incident/hospitalization.  He tells me to speak with his son.  After speaking to his son, Connor Dixon, he tells me that when he was discharged from [facility] on 11/08/2023 he was sent to [facility] rehabilitation center.  He tells me that at this place his father was not receiving his Lasix  as he normally does.  He believes that this led to him becoming volume overloaded which led to this admission.  Upon chart review, when he was last seen by our practice, in July 2024, he was taking 80 mg  of Lasix  in the morning and 40 mg of Lasix  in the afternoon.  Past Medical History:  Diagnosis Date   Acute on chronic diastolic congestive heart failure (HCC)    Arthus phenomenon    Atrioventricular block, complete (HCC)    Cardiac pacemaker in situ 07/22/2010   Chronic kidney disease, stage 3 (HCC)    Presumably from longstanding DM & HTN. Scr has gradually increased over the past 2 years (1.6 in 12/2010, 2 in 03/2011, and up to 2.1 in  01/2013)   DIABETES MELLITUS, TYPE I, ADULT ONSET 08/06/2007   DM (diabetes mellitus) (HCC)    adult onset    Essential hypertension, benign 08/06/2007   Very good BP control   History of second degree heart block    HLD (hyperlipidemia)    HYPERLIPIDEMIA 08/06/2007   Hypopotassemia    Morbid obesity (HCC)    Obesity    Other and unspecified angina pectoris    typical   Type I (juvenile type) diabetes mellitus without mention of complication, not stated as uncontrolled    (04/25/13 OV with Dr. Rayburn) = Poorly controlled & is working  w/Dr. Karlos to improve this. Most recent Hgb A1c was 8.2% but Mr. Knaggs reports it was down to 7.6 last month.   Past Surgical History:  Procedure Laterality Date   CATARACT EXTRACTION  8/08   [place]   PPM GENERATOR CHANGEOUT N/A 02/13/2019   Procedure: PPM GENERATOR CHANGEOUT;  Surgeon: Waddell Danelle ORN, MD;  Location: [facility] INVASIVE CV LAB;  Service: Cardiovascular;  Laterality: N/A;   PTVDP  12/11   PPM - [employer]   TOTAL HIP ARTHROPLASTY  04/05/06   right    Home Medications:  Prior to Admission medications   Medication Sig Start Date End Date Taking? Authorizing Provider  acetaminophen  (TYLENOL ) 325 MG tablet Take 650 mg by mouth every 8 (eight) hours as needed for moderate pain (pain score 4-6).   Yes [provider]  allopurinol  (ZYLOPRIM ) 100 MG tablet TAKE 1 TABLET(100 MG) BY MOUTH DAILY 09/25/23  Yes [name], MD  amLODipine  (NORVASC ) 10 MG tablet Take 1 tablet (10 mg total) by mouth daily. 11/08/23  Yes Gherghe, Costin M, MD  apixaban  (ELIQUIS ) 2.5 MG TABS tablet Take 1 tablet (2.5 mg total) by mouth 2 (two) times daily. 08/14/23  Yes [name], MD  calcitRIOL  (ROCALTROL ) 0.25 MCG capsule TAKE 1 CAPSULE BY MOUTH DAILY RETURN IN ABOUT 6 MONTHS(AROUND 02/04/2023) 08/14/23  Yes [name], MD  Cholecalciferol (VITAMIN D3) 250 MCG (10000 UT) capsule Take 10,000 Units by mouth daily.   Yes [provider]   feeding supplement, GLUCERNA SHAKE, (GLUCERNA SHAKE) LIQD Take 237 mLs by mouth daily. Patient taking differently: Take 237 mLs by mouth 3 (three) times daily between meals. 11/07/23  Yes Gherghe, Costin M, MD  ferrous sulfate  325 (65 FE) MG tablet Take 1 tablet (325 mg total) by mouth daily with breakfast. 11/08/23  Yes Gherghe, Costin M, MD  folic acid  (FOLVITE ) 1 MG tablet Take 1 tablet (1 mg total) by mouth daily. 11/08/23  Yes Gherghe, Costin M, MD  insulin  isophane & regular human KwikPen (HUMULIN  70/30 KWIKPEN) (70-30) 100 UNIT/ML KwikPen Inject 15 Units into the skin in the morning and at bedtime. 40 units in AM and 25 units in PM Patient taking differently: Inject 18-22 Units into the skin in the morning and at bedtime. 22 units in AM and 18 units in PM 11/08/23  Yes Danford,  Lonni SQUIBB, MD  levocetirizine (XYZAL ) 5 MG tablet Take 1 tablet (5 mg total) by mouth every evening. 09/06/23  Yes [name], MD  loratadine  (CLARITIN ) 10 MG tablet Take 10 mg by mouth daily as needed for allergies.   Yes [provider]  metoprolol  tartrate (LOPRESSOR ) 25 MG tablet Take 0.5 tablets (12.5 mg total) by mouth 2 (two) times daily. 11/07/23  Yes Gherghe, Costin M, MD  tamsulosin  (FLOMAX ) 0.4 MG CAPS capsule Take 1 capsule (0.4 mg total) by mouth daily. 11/08/23  Yes Gherghe, Nilda HERO, MD  Continuous Glucose Sensor (DEXCOM G7 SENSOR) MISC 1 Act by Does not apply route daily. Patient not taking: Reported on 11/22/2023 01/06/23   [name], MD  cyanocobalamin  1000 MCG tablet Take 1 tablet (1,000 mcg total) by mouth daily. Patient not taking: Reported on 12/26/2023 11/08/23   Gherghe, Costin M, MD  Glucagon  (GVOKE HYPOPEN  2-PACK) 1 MG/0.2ML SOAJ Inject 1 Act into the skin daily as needed. Patient not taking: Reported on 12/26/2023 04/05/22   [name], MD    Scheduled Meds:  NOREEN ON 12/27/2023] allopurinol   100 mg Oral Daily   [START ON 12/27/2023] amLODipine   10 mg Oral Daily    apixaban   2.5 mg Oral BID   [START ON 12/27/2023] folic acid   1 mg Oral Daily   furosemide   40 mg Intravenous BID   insulin  aspart  0-15 Units Subcutaneous TID WC   insulin  aspart  0-5 Units Subcutaneous QHS   metoprolol  tartrate  12.5 mg Oral BID   tamsulosin   0.4 mg Oral QPC supper   Continuous Infusions:  PRN Meds: acetaminophen  **OR** acetaminophen , albuterol , loratadine , ondansetron  **OR** ondansetron  (ZOFRAN ) IV, senna-docusate  Allergies:    Allergies  Allergen Reactions   Oysters [Shellfish Allergy] Hives and Swelling   Citrus Hives   Pecan Extract Hives   Apple Juice Other (See Comments)    Sore throat   Farxiga  [Dapagliflozin ] Other (See Comments)    Low blood sugar   Social History:   Social History   Socioeconomic History   Marital status: Widowed    Spouse name: Not on file   Number of children: 2   Years of education: Not on file   Highest education level: Not on file  Occupational History   Occupation: retired  Tobacco Use   Smoking status: Never   Smokeless tobacco: Never  Vaping Use   Vaping status: Never Used  Substance and Sexual Activity   Alcohol  use: No   Drug use: No   Sexual activity: Not Currently  Other Topics Concern   Not on file  Social History Narrative   [employer] BA, 2 Master's degrees. [place] - for certification Education specialist.    Retired Animal nutritionist.   Active - travels.   Married - 1961; 2 sons (1962 and 52) and 5 grandchildren.        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    Social Drivers of Corporate investment banker Strain: Low Risk  (04/06/2023)   Overall Financial Resource Strain (CARDIA)    Difficulty of Paying Living Expenses: Not hard at all  Food Insecurity: Patient Unable To Answer (10/13/2023)   Hunger Vital Sign    Worried About Running Out of Food in the Last Year: Patient unable to answer    Ran Out of Food in the Last Year: Patient unable to answer  Transportation Needs:  Patient Unable To Answer (10/13/2023)   PRAPARE - Transportation  Lack of Transportation (Medical): Patient unable to answer    Lack of Transportation (Non-Medical): Patient unable to answer  Physical Activity: Inactive (04/06/2023)   Exercise Vital Sign    Days of Exercise per Week: 0 days    Minutes of Exercise per Session: 0 min  Stress: No Stress Concern Present (04/06/2023)   [employer] of Occupational Health - Occupational Stress Questionnaire    Feeling of Stress : Not at all  Social Connections: Unknown (10/13/2023)   Social Connection and Isolation Panel    Frequency of Communication with Friends and Family: Patient unable to answer    Frequency of Social Gatherings with Friends and Family: Patient unable to answer    Attends Religious Services: More than 4 times per year    Active Member of Clubs or Organizations: Patient unable to answer    Attends Banker Meetings: Patient unable to answer    Marital Status: Widowed  Intimate Partner Violence: Patient Unable To Answer (10/13/2023)   Humiliation, Afraid, Rape, and Kick questionnaire    Fear of Current or Ex-Partner: Patient unable to answer    Emotionally Abused: Patient unable to answer    Physically Abused: Patient unable to answer    Sexually Abused: Patient unable to answer    Family History:   Family History  Problem Relation Age of Onset   Asthma Father    Coronary artery disease Father    Diabetes Father    Hyperlipidemia Father    Stroke Father 51   Coronary artery disease Mother    Asthma Mother    Heart attack Mother 95   Colon cancer Neg Hx        no definate prostate CA     ROS:  Please see the history of present illness.  All other ROS reviewed and negative.     Physical Exam/Data: Vitals:   12/26/23 0650 12/26/23 0652 12/26/23 1000 12/26/23 1056  BP:   133/82   Pulse:   92   Resp:   20   Temp:    98.5 F (36.9 C)  TempSrc:      SpO2: 93%  94%   Weight:  90 kg     Height:  5' 8 (1.727 m)     No intake or output data in the 24 hours ending 12/26/23 1508    12/26/2023    6:52 AM 11/22/2023    4:03 PM 10/10/2023    5:05 PM  Last 3 Weights  Weight (lbs) 198 lb 6.6 oz 200 lb 200 lb 9.9 oz  Weight (kg) 90 kg 90.719 kg 91 kg     Body mass index is 30.17 kg/m.   General:  in no acute distress HEENT: normal Neck: no JVD Vascular: No carotid bruits; Distal pulses 2+ bilaterally Cardiac:  normal S1, S2; RRR; no murmur  Lungs:  clear to auscultation bilaterally, no wheezing, rhonchi or rales  Abd: soft, nontender, no hepatomegaly  Ext: no edema Musculoskeletal:  No deformities, BUE and BLE strength normal and equal Skin: warm and dry  Psych:  mildly confused, not agitated    EKG:  The EKG was personally reviewed and demonstrates:  V-paced, HR 96   Relevant CV Studies:  Echocardiogram, 12/26/2023 Ordered, pending results   Echocardiogram, 10/11/2023 Left ventricular ejection fraction, by estimation, is 60 to 65% . The left ventricle has normal function. The left ventricle has no regional wall motion abnormalities. Left ventricular diastolic parameters are indeterminate. Right ventricular systolic function is normal.  The right ventricular size is normal.  The mitral valve is normal in structure. No evidence of mitral valve regurgitation. No evidence of mitral stenosis. Moderate mitral annular calcification.  The aortic valve is normal in structure. Aortic valve regurgitation is not visualized. No aortic stenosis is present. The inferior vena cava is normal in size with greater than 50% respiratory variability, suggesting right atrial pressure of 3 mmHg.  Remote device check, 11/23/2023 PPM Scheduled remote reviewed. Normal device function.  Presenting rhythm: AS-VP.  11 AHR detections, longest 10 seconds. 4 noise reversions.  Frequent out of range RV capture thresholds per autocapture trend.   Laboratory Data: High Sensitivity Troponin:  No results  for input(s): TROPONINIHS in the last 720 hours.   Chemistry Recent Labs  Lab 12/26/23 0656 12/26/23 0659  NA 137 137  K 4.7 4.6  CL 106 106  CO2  --  23  GLUCOSE 55* 57*  BUN 32* 25*  CREATININE 2.00* 1.90*  CALCIUM  --  8.9  GFRNONAA  --  34*  ANIONGAP  --  8    Recent Labs  Lab 12/26/23 0659  PROT 6.4*  ALBUMIN 2.7*  AST 25  ALT 12  ALKPHOS 77  BILITOT 1.0   Lipids No results for input(s): CHOL, TRIG, HDL, LABVLDL, LDLCALC, CHOLHDL in the last 168 hours.  Hematology Recent Labs  Lab 12/26/23 0656 12/26/23 0659  WBC  --  6.2  RBC  --  4.25  HGB 13.3 12.8*  HCT 39.0 39.2  MCV  --  92.2  MCH  --  30.1  MCHC  --  32.7  RDW  --  16.7*  PLT  --  234   Thyroid  No results for input(s): TSH, FREET4 in the last 168 hours.  BNP Recent Labs  Lab 12/26/23 0659  BNP 248.8*    DDimer No results for input(s): DDIMER in the last 168 hours.  Radiology/Studies:  DG Wrist Complete Left Result Date: 12/26/2023 CLINICAL DATA:  89 year old male status post fall with wrist pain. EXAM: LEFT WRIST - COMPLETE 3+ VIEW COMPARISON:  None Available. FINDINGS: Four views 0804 hours. Calcified peripheral vascular disease. Distal radius and ulna appear intact. Radiocarpal joint space loss at the scaphoid, radial styloid. Subchondral sclerosis there. Mild widening of the scapholunate interval. Carpal bone alignment otherwise maintained. Proximal metacarpals intact. Less pronounced degeneration at the 1st [facility] joint. No acute fracture or dislocation identified. IMPRESSION: 1. No acute fracture or dislocation identified about the left wrist. But mild widening of the scapholunate interval, can be posttraumatic and predispose to SLAC wrist. 2. Radiocarpal and 1st [facility] joint degeneration. 3. Calcified peripheral vascular disease. Electronically Signed   By: VEAR Hurst M.D.   On: 12/26/2023 09:00   DG Chest Port 1 View Result Date: 12/26/2023 CLINICAL DATA:  89 year old male status  post unwitnessed fall. On blood thinners. EXAM: PORTABLE CHEST 1 VIEW COMPARISON:  Cervical spine CT today. Portable chest 10/31/2023 and earlier. FINDINGS: Portable AP semi upright view at 0708 hours. Veiling bilateral lung opacity is new or increased since June. Stable lung volumes. Stable cardiac size and mediastinal contours. Stable left chest dual lead cardiac pacemaker. No pneumothorax. No air bronchograms. Dense retrocardiac opacity. Pulmonary vascularity is indistinct. Paucity of bowel gas in the visible abdomen. No acute osseous abnormality identified. IMPRESSION: Bilateral pleural effusions, new or increased since June. Confluent bibasilar atelectasis suspected. Cannot exclude underlying interstitial edema. Electronically Signed   By: VEAR Hurst M.D.   On: 12/26/2023 08:04  CT MAXILLOFACIAL WO CONTRAST Result Date: 12/26/2023 CLINICAL DATA:  89 year old male status post unwitnessed fall. On blood thinners. EXAM: CT MAXILLOFACIAL WITHOUT CONTRAST TECHNIQUE: Multidetector CT imaging of the maxillofacial structures was performed. Multiplanar CT image reconstructions were also generated. RADIATION DOSE REDUCTION: This exam was performed according to the departmental dose-optimization program which includes automated exposure control, adjustment of the mA and/or kV according to patient size and/or use of iterative reconstruction technique. COMPARISON:  Head and cervical spine CT today. FINDINGS: Osseous: Chronic TMJ degeneration, but mandible appears intact and aligned. Chronic dental caries. Bilateral maxilla, zygoma, pterygoid and nasal bones appear intact. Visible skull base appears intact. Orbits: Right orbit not completely included, but visible on head CT today reported separately. Left orbital walls intact. Postoperative changes to both globes which appear intact. Intraorbital soft tissues appear symmetric and normal. Left premalar, inferior preseptal space soft tissue swelling and stranding  redemonstrated. No soft tissue gas. Sinuses: Well aerated bilaterally. Soft tissues: Mild motion artifact of the larynx and pharynx. Negative visible noncontrast parapharyngeal spaces, retropharyngeal space, sublingual space, submandibular spaces, masticator and parotid spaces. Limited intracranial: Reported separately. IMPRESSION: 1. Left premalar, inferior preseptal soft tissue injury with hematoma. 2. No facial fracture identified. Electronically Signed   By: VEAR Hurst M.D.   On: 12/26/2023 08:02   CT CERVICAL SPINE WO CONTRAST Result Date: 12/26/2023 CLINICAL DATA:  89 year old male status post unwitnessed fall. On blood thinners. EXAM: CT CERVICAL SPINE WITHOUT CONTRAST TECHNIQUE: Multidetector CT imaging of the cervical spine was performed without intravenous contrast. Multiplanar CT image reconstructions were also generated. RADIATION DOSE REDUCTION: This exam was performed according to the departmental dose-optimization program which includes automated exposure control, adjustment of the mA and/or kV according to patient size and/or use of iterative reconstruction technique. COMPARISON:  CT head and face today.  CTA neck 10/10/2023. FINDINGS: Alignment: Stable straightening of cervical lordosis. Mild dextroconvex rather than levoconvex cervical spine curvature now. Mild chronic and degenerative appearing cervical spine spondylolisthesis. Cervicothoracic junction alignment is within normal limits. Bilateral posterior element alignment is within normal limits. Skull base and vertebrae: Bone mineralization is within normal limits for age. Visualized skull base is intact. No atlanto-occipital dissociation. C1 and C2 appear chronically degenerated, but intact and aligned. No acute osseous abnormality identified. Soft tissues and spinal canal: No prevertebral fluid or swelling. No visible canal hematoma. Negative visible noncontrast neck soft tissues. Disc levels: Chronic cervical spine degeneration appears  stable from the May CTA. Upper chest: Large pleural effusions visible in both lung apices, simple fluid density. Visible upper thoracic levels appear grossly stable and intact. IMPRESSION: 1. No acute traumatic injury identified in the cervical spine. Stable chronic cervical spine degeneration. 2. Large pleural effusions visible in both lung apices. Electronically Signed   By: VEAR Hurst M.D.   On: 12/26/2023 07:59   CT HEAD WO CONTRAST Result Date: 12/26/2023 CLINICAL DATA:  89 year old male status post unwitnessed fall. On blood thinners. EXAM: CT HEAD WITHOUT CONTRAST TECHNIQUE: Contiguous axial images were obtained from the base of the skull through the vertex without intravenous contrast. RADIATION DOSE REDUCTION: This exam was performed according to the departmental dose-optimization program which includes automated exposure control, adjustment of the mA and/or kV according to patient size and/or use of iterative reconstruction technique. COMPARISON:  Brain MRI 10/17/2023 head CT 10/11/2023. FINDINGS: Brain: Stable cerebral volume. No midline shift, ventriculomegaly, mass effect, evidence of mass lesion, intracranial hemorrhage or evidence of cortically based acute infarction. Confluent bilateral periventricular white matter hypodensity,  chronic lacunar infarcts in the right basal ganglia. Stable gray-white matter differentiation throughout the brain. Vascular: Calcified atherosclerosis at the skull base. No suspicious intracranial vascular hyperdensity. Skull: Stable.  No acute fracture identified. Sinuses/Orbits: Visualized paranasal sinuses and mastoids are stable and well aerated. Other: Left infraorbital, premalar soft tissue swelling and stranding in keeping with superficial hematoma/contusion series 4, image 11. Underlying left maxilla and zygoma appear stable and intact. Other orbit and scalp soft tissues appear stable. IMPRESSION: 1. Left infraorbital/premalar soft tissue hematoma. No underlying  fracture is identified. 2. No acute intracranial abnormality. Stable CT appearance of chronic small vessel disease. Electronically Signed   By: VEAR Hurst M.D.   On: 12/26/2023 07:56   Assessment and Plan:  Acute on chronic HFpEF Hypertension  Presented with a mechanical fall Found to have BNP 248 CXR showed bilateral pleural effusions  CT showed large pleural effusions, bilaterally  Creatinine 2.00 > 1.90 (baseline 1.6-1.8) Previously on PO Lasix  80 mg in AM, 40 mg in PM, discharged on PRN Lasix  10/2023 when sent to [facility]  Did not receive any doses of Lasix  while in rehab center per son  Pending echocardiogram  Currently on IV Lasix  40 mg BID -- may require higher dose with renal function, continue to monitor increase as needed Continue strict I's and O's, daily BMPs, daily weights Can add or titrate medications based on echocardiogram results Currently on amlodipine  10 mg daily Currently on Lopressor  12.5 mg BID   Paroxysmal atrial fibrillation Complete heart block s/p PPM 2011 Known history of PAF Currently in sinus with HR 90s Continue Eliquis  2.5 mg BID (appropriate given age, renal function) Continue Lopressor  12.5 mg BID  Per primary Fall Left wrist pain CKD stage 3b Hyperlipidemia  Diabetes  Risk Assessment/Risk Scores:      New York  Heart Association (NYHA) Functional Class NYHA Class II  CHA2DS2-VASc Score = 5   This indicates a 7.2% annual risk of stroke. The patient's score is based upon: CHF History: 1 HTN History: 1 Diabetes History: 1 Stroke History: 0 Vascular Disease History: 0 Age Score: 2 Gender Score: 0        For questions or updates, please contact  [facility] Please consult [email or web address] for contact info under    Signed, Waddell DELENA Donath, PA-C  12/26/2023 3:08 PM

## 2023-12-26 NOTE — ED Notes (Signed)
 Connor Dixon family friend [phone]

## 2023-12-26 NOTE — ED Notes (Signed)
 Family updated as to patient's status.

## 2023-12-26 NOTE — ED Triage Notes (Signed)
 Patient BIB [facility] from [facility] as lvl 2 fall on thinners. Patient had unwitnessed fall, staff heard thud and went to room. Patient states he had taken a couple steps, tripped, and had a mechanical fall from standing. Patient denies LOC and recalls fall occurring. Patient A&Ox2 at baseline and presents as such. Left eye and left posterior head hematoma. Patient c/o 7/10 pain in left wrist, PMS intact. VSS w/ EMS.

## 2023-12-26 NOTE — Progress Notes (Signed)
 Son, [name] called for update -- patient confused and restless pulling at lines and attempting to get out of bed, asked if family could come stay at bedside and informed by son there was no one available and requested for medication to help him sleep.  Will contact MD on call.

## 2023-12-27 ENCOUNTER — Observation Stay (HOSPITAL_COMMUNITY)

## 2023-12-27 DIAGNOSIS — Z950 Presence of cardiac pacemaker: Secondary | ICD-10-CM | POA: Diagnosis not present

## 2023-12-27 DIAGNOSIS — E785 Hyperlipidemia, unspecified: Secondary | ICD-10-CM | POA: Diagnosis present

## 2023-12-27 DIAGNOSIS — I5023 Acute on chronic systolic (congestive) heart failure: Secondary | ICD-10-CM | POA: Diagnosis not present

## 2023-12-27 DIAGNOSIS — J9601 Acute respiratory failure with hypoxia: Secondary | ICD-10-CM | POA: Diagnosis present

## 2023-12-27 DIAGNOSIS — Z79899 Other long term (current) drug therapy: Secondary | ICD-10-CM | POA: Diagnosis not present

## 2023-12-27 DIAGNOSIS — Z8249 Family history of ischemic heart disease and other diseases of the circulatory system: Secondary | ICD-10-CM | POA: Diagnosis not present

## 2023-12-27 DIAGNOSIS — N1832 Chronic kidney disease, stage 3b: Secondary | ICD-10-CM | POA: Diagnosis present

## 2023-12-27 DIAGNOSIS — E1022 Type 1 diabetes mellitus with diabetic chronic kidney disease: Secondary | ICD-10-CM | POA: Diagnosis present

## 2023-12-27 DIAGNOSIS — I10 Essential (primary) hypertension: Secondary | ICD-10-CM | POA: Diagnosis not present

## 2023-12-27 DIAGNOSIS — Z96641 Presence of right artificial hip joint: Secondary | ICD-10-CM | POA: Diagnosis present

## 2023-12-27 DIAGNOSIS — Z7901 Long term (current) use of anticoagulants: Secondary | ICD-10-CM | POA: Diagnosis not present

## 2023-12-27 DIAGNOSIS — M25532 Pain in left wrist: Secondary | ICD-10-CM | POA: Diagnosis present

## 2023-12-27 DIAGNOSIS — I5031 Acute diastolic (congestive) heart failure: Secondary | ICD-10-CM | POA: Diagnosis not present

## 2023-12-27 DIAGNOSIS — I429 Cardiomyopathy, unspecified: Secondary | ICD-10-CM | POA: Diagnosis present

## 2023-12-27 DIAGNOSIS — E66811 Obesity, class 1: Secondary | ICD-10-CM | POA: Diagnosis present

## 2023-12-27 DIAGNOSIS — E10649 Type 1 diabetes mellitus with hypoglycemia without coma: Secondary | ICD-10-CM | POA: Diagnosis present

## 2023-12-27 DIAGNOSIS — I5043 Acute on chronic combined systolic (congestive) and diastolic (congestive) heart failure: Secondary | ICD-10-CM | POA: Diagnosis present

## 2023-12-27 DIAGNOSIS — W06XXXA Fall from bed, initial encounter: Secondary | ICD-10-CM | POA: Diagnosis present

## 2023-12-27 DIAGNOSIS — I5033 Acute on chronic diastolic (congestive) heart failure: Secondary | ICD-10-CM | POA: Diagnosis not present

## 2023-12-27 DIAGNOSIS — Z91013 Allergy to seafood: Secondary | ICD-10-CM | POA: Diagnosis not present

## 2023-12-27 DIAGNOSIS — I509 Heart failure, unspecified: Secondary | ICD-10-CM

## 2023-12-27 DIAGNOSIS — E162 Hypoglycemia, unspecified: Secondary | ICD-10-CM | POA: Diagnosis present

## 2023-12-27 DIAGNOSIS — D649 Anemia, unspecified: Secondary | ICD-10-CM | POA: Diagnosis present

## 2023-12-27 DIAGNOSIS — F0390 Unspecified dementia without behavioral disturbance: Secondary | ICD-10-CM | POA: Diagnosis present

## 2023-12-27 DIAGNOSIS — Z833 Family history of diabetes mellitus: Secondary | ICD-10-CM | POA: Diagnosis not present

## 2023-12-27 DIAGNOSIS — N179 Acute kidney failure, unspecified: Secondary | ICD-10-CM | POA: Diagnosis present

## 2023-12-27 DIAGNOSIS — I480 Paroxysmal atrial fibrillation: Secondary | ICD-10-CM | POA: Diagnosis present

## 2023-12-27 DIAGNOSIS — I442 Atrioventricular block, complete: Secondary | ICD-10-CM | POA: Diagnosis present

## 2023-12-27 DIAGNOSIS — I130 Hypertensive heart and chronic kidney disease with heart failure and stage 1 through stage 4 chronic kidney disease, or unspecified chronic kidney disease: Secondary | ICD-10-CM | POA: Diagnosis present

## 2023-12-27 DIAGNOSIS — J9811 Atelectasis: Secondary | ICD-10-CM | POA: Diagnosis present

## 2023-12-27 DIAGNOSIS — Z794 Long term (current) use of insulin: Secondary | ICD-10-CM | POA: Diagnosis not present

## 2023-12-27 LAB — CBC
HCT: 36.9 % — ABNORMAL LOW (ref 39.0–52.0)
Hemoglobin: 11.9 g/dL — ABNORMAL LOW (ref 13.0–17.0)
MCH: 29.7 pg (ref 26.0–34.0)
MCHC: 32.2 g/dL (ref 30.0–36.0)
MCV: 92 fL (ref 80.0–100.0)
Platelets: 231 K/uL (ref 150–400)
RBC: 4.01 MIL/uL — ABNORMAL LOW (ref 4.22–5.81)
RDW: 16.7 % — ABNORMAL HIGH (ref 11.5–15.5)
WBC: 5.7 K/uL (ref 4.0–10.5)
nRBC: 0 % (ref 0.0–0.2)

## 2023-12-27 LAB — COMPREHENSIVE METABOLIC PANEL WITH GFR
ALT: 9 U/L (ref 0–44)
AST: 26 U/L (ref 15–41)
Albumin: 2.4 g/dL — ABNORMAL LOW (ref 3.5–5.0)
Alkaline Phosphatase: 71 U/L (ref 38–126)
Anion gap: 10 (ref 5–15)
BUN: 24 mg/dL — ABNORMAL HIGH (ref 8–23)
CO2: 25 mmol/L (ref 22–32)
Calcium: 8.9 mg/dL (ref 8.9–10.3)
Chloride: 104 mmol/L (ref 98–111)
Creatinine, Ser: 1.9 mg/dL — ABNORMAL HIGH (ref 0.61–1.24)
GFR, Estimated: 34 mL/min — ABNORMAL LOW (ref >60)
Glucose, Bld: 186 mg/dL — ABNORMAL HIGH (ref 70–99)
Potassium: 4.3 mmol/L (ref 3.5–5.1)
Sodium: 139 mmol/L (ref 135–145)
Total Bilirubin: 1.4 mg/dL — ABNORMAL HIGH (ref 0.0–1.2)
Total Protein: 5.6 g/dL — ABNORMAL LOW (ref 6.5–8.1)

## 2023-12-27 LAB — GLUCOSE, CAPILLARY
Glucose-Capillary: 259 mg/dL — ABNORMAL HIGH (ref 70–99)
Glucose-Capillary: 172 mg/dL — ABNORMAL HIGH (ref 70–99)
Glucose-Capillary: 239 mg/dL — ABNORMAL HIGH (ref 70–99)
Glucose-Capillary: 281 mg/dL — ABNORMAL HIGH (ref 70–99)

## 2023-12-27 LAB — ECHOCARDIOGRAM COMPLETE
Calc EF: 22.6 %
Height: 69.5 in
S' Lateral: 4 cm
Single Plane A2C EF: 27.3 %
Single Plane A4C EF: 15.3 %
Weight: 3216.95 [oz_av]

## 2023-12-27 LAB — MAGNESIUM: Magnesium: 2 mg/dL (ref 1.7–2.4)

## 2023-12-27 MED ORDER — MEDIHONEY WOUND/BURN DRESSING EX PSTE
1.0000 | PASTE | Freq: Every day | CUTANEOUS | Status: DC
  Administered 2023-12-27 – 2023-12-29 (×4): 1 via TOPICAL
  Filled 2023-12-27: qty 44

## 2023-12-27 MED ORDER — PERFLUTREN LIPID MICROSPHERE
1.0000 mL | INTRAVENOUS | Status: AC | PRN
  Administered 2023-12-27 (×2): 3 mL via INTRAVENOUS

## 2023-12-27 MED ORDER — METOPROLOL SUCCINATE ER 25 MG PO TB24
25.0000 mg | ORAL_TABLET | Freq: Every day | ORAL | Status: DC
  Administered 2023-12-27 – 2023-12-29 (×4): 25 mg via ORAL
  Filled 2023-12-27 (×3): qty 1

## 2023-12-27 NOTE — Plan of Care (Signed)
  Problem: Education: Goal: Ability to describe self-care measures that may prevent or decrease complications (Diabetes Survival Skills Education) will improve Outcome: Progressing   Problem: Coping: Goal: Ability to adjust to condition or change in health will improve Outcome: Progressing   Problem: Fluid Volume: Goal: Ability to maintain a balanced intake and output will improve Outcome: Progressing   Problem: Nutritional: Goal: Progress toward achieving an optimal weight will improve Outcome: Progressing   Problem: Skin Integrity: Goal: Risk for impaired skin integrity will decrease Outcome: Progressing   Problem: Nutritional: Goal: Maintenance of adequate nutrition will improve Outcome: Progressing

## 2023-12-27 NOTE — TOC Initial Note (Addendum)
 Transition of Care [facility]) - Initial/Assessment Note    Patient Details  Name: Connor Dixon MRN: [id number] Date of Birth: 05/21/1934  [facility]) CM/SW Contact:    Luise JAYSON Pan, LCSWA Phone Number: 12/27/2023, 8:57 AM  Clinical Narrative:     CSW spoke with patients son, [name], to confirm patient is from [facility] as patient is oriented x1 at this time.  [name] stated patient is from [facility] and that him and his brother want patient to discharge back there today.  [name] stated patient was working with [employer] PT/OT at [facility].  CSW spoke with Therisa, resident care director at ALF, [phone]) about patient returning when medically stable. Therisa stated patient can return and CSW will need to provide cosigned FL2, DC summary, and orders for PT/OT. CSW notified MD and bedside RN.   4:38 PM CSW called [name] to inform him of PT recs for SNF STR. At this time, [name] declined SNF and wants his dad to discharge back to his ALF with HHPT/OT.   CSW will continue to follow.    Expected Discharge Plan: Assisted Living Barriers to Discharge: Continued Medical Work up   Patient Goals and CMS Choice Patient states their goals for this hospitalization and ongoing recovery are:: Go back to ALF          Expected Discharge Plan and Services In-house Referral: Clinical Social Work Discharge Planning Services: CM Consult   Living arrangements for the past 2 months: Assisted Living Facility                                      Prior Living Arrangements/Services Living arrangements for the past 2 months: Assisted Living Facility Lives with:: Facility Resident Patient language and need for interpreter reviewed:: Yes Do you feel safe going back to the place where you live?: Yes      Need for Family Participation in Patient Care: Yes (Comment) Care giver support system in place?: Yes (comment) Current home services: Home PT, Home OT Criminal  Activity/Legal Involvement Pertinent to Current Situation/Hospitalization: No - Comment as needed  Activities of Daily Living   ADL Screening (condition at time of admission) Independently performs ADLs?: No Does the patient have a NEW difficulty with bathing/dressing/toileting/self-feeding that is expected to last >3 days?: No Does the patient have a NEW difficulty with getting in/out of bed, walking, or climbing stairs that is expected to last >3 days?: No Does the patient have a NEW difficulty with communication that is expected to last >3 days?: No Is the patient deaf or have difficulty hearing?: No Does the patient have difficulty seeing, even when wearing glasses/contacts?: No Does the patient have difficulty concentrating, remembering, or making decisions?: Yes  Permission Sought/Granted Permission sought to share information with : Facility Medical sales representative, Family Supports Permission granted to share information with : Yes, Verbal Permission Granted  Share Information with NAME: Hulen,STEVEN  Permission granted to share info w AGENCY: [facility]  Permission granted to share info w Relationship: Son  Permission granted to share info w Contact Information: [phone]  Emotional Assessment Appearance:: Appears stated age Attitude/Demeanor/Rapport: Unable to Assess Affect (typically observed): Unable to Assess Orientation: : Oriented to Self Alcohol  / Substance Use: Not Applicable Psych Involvement: No (comment)  Admission diagnosis:  Hypoglycemia [E16.2] CHF exacerbation (HCC) [I50.9] Fall, initial encounter [W19.XXXA] Congestive heart failure, unspecified HF chronicity, unspecified heart  failure type [facility]) [I50.9] Patient Active Problem List   Diagnosis Date Noted   Acute on chronic heart failure with preserved ejection fraction (HFpEF) (HCC) 12/26/2023   AKI (acute kidney injury) (HCC) 12/26/2023   Heart block AV complete (HCC) 12/26/2023   Palliative care  by specialist 10/16/2023   Acute encephalopathy 10/10/2023   Elevated troponin 10/10/2023   Prolonged QT interval 10/10/2023   CKD stage 4 due to type 2 diabetes mellitus (HCC) 09/06/2023   Seasonal allergic rhinitis due to pollen 09/06/2023   Post-traumatic osteoarthritis of left shoulder 01/05/2023   PAF (paroxysmal atrial fibrillation) (HCC) 09/03/2021   Diabetic nephropathy associated with type 2 diabetes mellitus (HCC) 06/09/2021   Vitamin D  deficiency disease 06/09/2021   Insulin -requiring or dependent type II diabetes mellitus (HCC) 06/09/2021   Encounter for general adult medical examination with abnormal findings 06/09/2020   Sinus node dysfunction (HCC) 02/13/2019   History of permanent cardiac pacemaker placement 07/22/2010   Hyperlipidemia with target LDL less than 70 08/06/2007   Primary hypertension 08/06/2007   PCP:  [name], MD Pharmacy:   [id number] - [place], [place] - [address] AT [facility] OF [address] [name] [address] Phone: [phone] Fax: [phone]     Social Drivers of Health (SDOH) Social History: SDOH Screenings   Food Insecurity: Patient Unable To Answer (12/26/2023)  Housing: Unknown (12/26/2023)  Transportation Needs: Patient Unable To Answer (12/26/2023)  Utilities: Patient Unable To Answer (12/26/2023)  Alcohol  Screen: Low Risk  (04/06/2023)  Depression (PHQ2-9): Low Risk  (09/06/2023)  Financial Resource Strain: Low Risk  (04/06/2023)  Physical Activity: Inactive (04/06/2023)  Social Connections: Unknown (12/26/2023)  Stress: No Stress Concern Present (04/06/2023)  Tobacco Use: Low Risk  (12/26/2023)  Health Literacy: Adequate Health Literacy (04/06/2023)   SDOH Interventions:     Readmission Risk Interventions     No data to display

## 2023-12-27 NOTE — Progress Notes (Signed)
 Mobility Specialist Progress Note:   12/27/23 0900  Mobility  Activity Pivoted/transferred from bed to chair  Level of Assistance Minimal assist, patient does 75% or more  Assistive Device Front wheel walker  Distance Ambulated (ft) 3 ft  Activity Response Tolerated well  Mobility Referral Yes  Mobility visit 1 Mobility  Mobility Specialist Start Time (ACUTE ONLY) 0900  Mobility Specialist Stop Time (ACUTE ONLY) 0915  Mobility Specialist Time Calculation (min) (ACUTE ONLY) 15 min   Pt agreeable to mobility session. Pt confused and bed alarm going off. Pt soiled, pericare performed. Able to stand and pivot with minA. No c/o throughout transfer. Left in chair with alarm on and all needs met.   Therisa Rana Mobility Specialist Please contact via SecureChat or  Rehab office at [phone]

## 2023-12-27 NOTE — Evaluation (Signed)
 Physical Therapy Evaluation Patient Details Name: Connor Dixon MRN: [id number] DOB: May 07, 1935 Today's Date: 12/27/2023  History of Present Illness  Pt is an 89 y.o. male who presented 8/12 s/p fall. Imaging including CT of the head, cervical spine, maxillofacial, and L wrist negative for any fractures/dislocations. Pt admitted with possible acute on chronic diastolic heart failure, acute respiratory failure with hypoxia, and bilateral pleural effusions. PMH - afib on Eliquis , pacer, HTN, CKD, DM, obesity, CHF, DM1, HLD   Clinical Impression  Pt presents with condition above and deficits mentioned below, see PT Problem List. PTA, he was most recently at a SNF working on ambulating with a RW with PT. Currently, the pt is demonstrating deficits in cognition, balance, power, strength, and endurance. He is currently at risk for falls, requiring minA for all functional mobility, utilizing a RW for standing mobility. The pt would benefit from further follow-up with skilled PT an inpatient rehab, < 3 hours/day, to maximize his independence and safety with all functional mobility. Will continue to follow acutely.        If plan is discharge home, recommend the following: A little help with walking and/or transfers;A little help with bathing/dressing/bathroom;Assistance with cooking/housework;Direct supervision/assist for medications management;Direct supervision/assist for financial management;Help with stairs or ramp for entrance;Assist for transportation;Supervision due to cognitive status   Can travel by private vehicle   Yes    Equipment Recommendations Other (comment) (defer to next venue of care)  Recommendations for Other Services  OT consult    Functional Status Assessment Patient has had a recent decline in their functional status and demonstrates the ability to make significant improvements in function in a reasonable and predictable amount of time.     Precautions / Restrictions  Precautions Precautions: Fall Recall of Precautions/Restrictions: Impaired Restrictions Weight Bearing Restrictions Per Provider Order: No      Mobility  Bed Mobility Overal bed mobility: Needs Assistance Bed Mobility: Supine to Sit     Supine to sit: Min assist, HOB elevated     General bed mobility comments: Pt needing HHA to pull trunk up to sit with minA    Transfers Overall transfer level: Needs assistance Equipment used: Rolling walker (2 wheels) Transfers: Sit to/from Stand Sit to Stand: Min assist           General transfer comment: MinA needed to power up to stand from EOB and gain balance    Ambulation/Gait Ambulation/Gait assistance: Min assist Gait Distance (Feet): 50 Feet Assistive device: Rolling walker (2 wheels) Gait Pattern/deviations: Step-through pattern, Decreased step length - right, Decreased step length - left, Decreased stride length, Trunk flexed Gait velocity: reduced Gait velocity interpretation: <1.31 ft/sec, indicative of household ambulator   General Gait Details: Pt takes slow, small, unsteady steps with RW support and minA for balance.  Stairs            Wheelchair Mobility     Tilt Bed    Modified Rankin (Stroke Patients Only) Modified Rankin (Stroke Patients Only) Pre-Morbid Rankin Score: Moderately severe disability Modified Rankin: Moderately severe disability     Balance Overall balance assessment: Needs assistance Sitting-balance support: No upper extremity supported, Feet supported Sitting balance-Leahy Scale: Fair Sitting balance - Comments: static sitting EOB with supervision for safety   Standing balance support: Bilateral upper extremity supported, During functional activity, Reliant on assistive device for balance Standing balance-Leahy Scale: Poor Standing balance comment: reliant on RW and external physical assistance  Pertinent Vitals/Pain Pain  Assessment Pain Assessment: Faces Faces Pain Scale: No hurt Pain Intervention(s): Monitored during session    Home Living Family/patient expects to be discharged to:: Skilled nursing facility                        Prior Function Prior Level of Function : Needs assist             Mobility Comments: Prior to 10/13/23, pt was independent without AD. Since recent admission to SNF, pt has been working with PT on ambulating with a RW. ADLs Comments: Prior to 10/13/23 - Had some assist with medications off and on a couple days a week from a family friend. Sincer recent admission and d/c to SNF pt's cognition has remained impaired per family     Extremity/Trunk Assessment   Upper Extremity Assessment Upper Extremity Assessment: Defer to OT evaluation    Lower Extremity Assessment Lower Extremity Assessment: Generalized weakness (grossly 4/5 MMT bil)       Communication   Communication Communication: No apparent difficulties    Cognition Arousal: Alert Behavior During Therapy: WFL for tasks assessed/performed   PT - Cognitive impairments: History of cognitive impairments                       PT - Cognition Comments: Family reports pt has had continued cognitive deficits since his last admission. Pt perseverating on wanting to d/c home currently, but redirectable. Pt did not seem aware he was covered in urine. Delayed processing noted Following commands: Impaired Following commands impaired: Follows one step commands with increased time     Cueing Cueing Techniques: Verbal cues, Tactile cues     General Comments      Exercises     Assessment/Plan    PT Assessment Patient needs continued PT services  PT Problem List Decreased strength;Decreased activity tolerance;Decreased mobility;Decreased balance;Decreased cognition;Decreased safety awareness       PT Treatment Interventions DME instruction;Gait training;Stair training;Functional mobility  training;Therapeutic activities;Therapeutic exercise;Balance training;Neuromuscular re-education;Cognitive remediation;Patient/family education    PT Goals (Current goals can be found in the Care Plan section)  Acute Rehab PT Goals Patient Stated Goal: to go home PT Goal Formulation: With patient/family Time For Goal Achievement: 01/10/24 Potential to Achieve Goals: Good    Frequency Min 2X/week     Co-evaluation               AM-PAC PT 6 Clicks Mobility  Outcome Measure Help needed turning from your back to your side while in a flat bed without using bedrails?: A Little Help needed moving from lying on your back to sitting on the side of a flat bed without using bedrails?: A Little Help needed moving to and from a bed to a chair (including a wheelchair)?: A Little Help needed standing up from a chair using your arms (e.g., wheelchair or bedside chair)?: A Little Help needed to walk in hospital room?: A Little Help needed climbing 3-5 steps with a railing? : Total 6 Click Score: 16    End of Session Equipment Utilized During Treatment: Gait belt Activity Tolerance: Patient tolerated treatment well Patient left: in chair;with call bell/phone within reach;with chair alarm set;with family/visitor present Nurse Communication: Mobility status PT Visit Diagnosis: Unsteadiness on feet (R26.81);Other abnormalities of gait and mobility (R26.89);Muscle weakness (generalized) (M62.81);Difficulty in walking, not elsewhere classified (R26.2)    Time: 8863-8844 PT Time Calculation (min) (ACUTE ONLY): 19 min   Charges:  PT Evaluation $PT Eval Moderate Complexity: 1 Mod   PT General Charges $$ ACUTE PT VISIT: 1 Visit         Theo Ferretti, PT, DPT Acute Rehabilitation Services  Office: [phone]   Theo CHRISTELLA Ferretti 12/27/2023, 12:32 PM

## 2023-12-27 NOTE — Progress Notes (Signed)
 Dr. Franky notified of son's request and order received for Melatonin and to be given.  Will continue with current plan of care.

## 2023-12-27 NOTE — Progress Notes (Addendum)
 Progress Note  Patient Name: Connor Dixon Date of Encounter: 12/27/2023  [facility] Cardiologist: None   Patient Profile     Subjective   Feeling better today.  Put out 1.8 L yesterday on IV Lasix .  Shortness of breath is improved and lower extremity edema has improved  Inpatient Medications    Scheduled Meds:  allopurinol   100 mg Oral Daily   amLODipine   10 mg Oral Daily   apixaban   2.5 mg Oral BID   folic acid   1 mg Oral Daily   furosemide   40 mg Intravenous BID   insulin  aspart  0-15 Units Subcutaneous TID WC   insulin  aspart  0-5 Units Subcutaneous QHS   leptospermum manuka honey  1 Application Topical Daily   metoprolol  tartrate  12.5 mg Oral BID   tamsulosin   0.4 mg Oral QPC supper   Continuous Infusions:  PRN Meds: acetaminophen  **OR** acetaminophen , albuterol , loratadine , ondansetron  **OR** ondansetron  (ZOFRAN ) IV, senna-docusate   Vital Signs    Vitals:   12/26/23 2127 12/26/23 2356 12/27/23 0431 12/27/23 0828  BP: 129/83 112/73 124/76 115/68  Pulse: 90 74 98 79  Resp:  19 19 16   Temp:  98.1 F (36.7 C) 97.6 F (36.4 C) 97.6 F (36.4 C)  TempSrc:  Oral Oral Oral  SpO2:  99% 95% 97%  Weight:   91.2 kg   Height:        Intake/Output Summary (Last 24 hours) at 12/27/2023 0901 Last data filed at 12/27/2023 0434 Gross per 24 hour  Intake 240 ml  Output 1800 ml  Net -1560 ml      12/27/2023    4:31 AM 12/26/2023    6:52 AM 11/22/2023    4:03 PM  Last 3 Weights  Weight (lbs) 201 lb 1 oz 198 lb 6.6 oz 200 lb  Weight (kg) 91.2 kg 90 kg 90.719 kg      Telemetry    Normal sinus rhythm with V paced rhythm- Personally Reviewed  ECG    No new EKG to review- Personally Reviewed  Physical Exam   GEN: No acute distress.   Neck: No JVD Cardiac: RRR, no rubs, or gallops.  2/6 systolic murmur at the right upper sternal border Respiratory: trace crackles at bases GI: Soft, nontender, non-distended  FD:umjrz pedal edema; No deformity. Neuro:   Nonfocal  Psych: Normal affect   Labs    High Sensitivity Troponin:  No results for input(s): TROPONINIHS in the last 720 hours.    Chemistry Recent Labs  Lab 12/26/23 0656 12/26/23 0659 12/27/23 0235  NA 137 137 139  K 4.7 4.6 4.3  CL 106 106 104  CO2  --  23 25  GLUCOSE 55* 57* 186*  BUN 32* 25* 24*  CREATININE 2.00* 1.90* 1.90*  CALCIUM  --  8.9 8.9  PROT  --  6.4* 5.6*  ALBUMIN  --  2.7* 2.4*  AST  --  25 26  ALT  --  12 9  ALKPHOS  --  77 71  BILITOT  --  1.0 1.4*  GFRNONAA  --  34* 34*  ANIONGAP  --  8 10     Hematology Recent Labs  Lab 12/26/23 0656 12/26/23 0659 12/27/23 0235  WBC  --  6.2 5.7  RBC  --  4.25 4.01*  HGB 13.3 12.8* 11.9*  HCT 39.0 39.2 36.9*  MCV  --  92.2 92.0  MCH  --  30.1 29.7  MCHC  --  32.7 32.2  RDW  --  16.7* 16.7*  PLT  --  234 231    BNP Recent Labs  Lab 12/26/23 0659  BNP 248.8*     DDimer No results for input(s): DDIMER in the last 168 hours.   CHA2DS2-VASc Score = 5   This indicates a 7.2% annual risk of stroke. The patient's score is based upon: CHF History: 1 HTN History: 1 Diabetes History: 1 Stroke History: 0 Vascular Disease History: 0 Age Score: 2 Gender Score: 0      Radiology    DG Wrist Complete Left Result Date: 12/26/2023 CLINICAL DATA:  89 year old male status post fall with wrist pain. EXAM: LEFT WRIST - COMPLETE 3+ VIEW COMPARISON:  None Available. FINDINGS: Four views 0804 hours. Calcified peripheral vascular disease. Distal radius and ulna appear intact. Radiocarpal joint space loss at the scaphoid, radial styloid. Subchondral sclerosis there. Mild widening of the scapholunate interval. Carpal bone alignment otherwise maintained. Proximal metacarpals intact. Less pronounced degeneration at the [facility] joint. No acute fracture or dislocation identified. IMPRESSION: 1. No acute fracture or dislocation identified about the left wrist. But mild widening of the scapholunate interval, can be  posttraumatic and predispose to SLAC wrist. 2. Radiocarpal and [facility] joint degeneration. 3. Calcified peripheral vascular disease. Electronically Signed   By: VEAR Hurst M.D.   On: 12/26/2023 09:00   DG Chest Port 1 View Result Date: 12/26/2023 CLINICAL DATA:  89 year old male status post unwitnessed fall. On blood thinners. EXAM: PORTABLE CHEST 1 VIEW COMPARISON:  Cervical spine CT today. Portable chest 10/31/2023 and earlier. FINDINGS: Portable AP semi upright view at 0708 hours. Veiling bilateral lung opacity is new or increased since June. Stable lung volumes. Stable cardiac size and mediastinal contours. Stable left chest dual lead cardiac pacemaker. No pneumothorax. No air bronchograms. Dense retrocardiac opacity. Pulmonary vascularity is indistinct. Paucity of bowel gas in the visible abdomen. No acute osseous abnormality identified. IMPRESSION: Bilateral pleural effusions, new or increased since June. Confluent bibasilar atelectasis suspected. Cannot exclude underlying interstitial edema. Electronically Signed   By: VEAR Hurst M.D.   On: 12/26/2023 08:04   CT MAXILLOFACIAL WO CONTRAST Result Date: 12/26/2023 CLINICAL DATA:  89 year old male status post unwitnessed fall. On blood thinners. EXAM: CT MAXILLOFACIAL WITHOUT CONTRAST TECHNIQUE: Multidetector CT imaging of the maxillofacial structures was performed. Multiplanar CT image reconstructions were also generated. RADIATION DOSE REDUCTION: This exam was performed according to the departmental dose-optimization program which includes automated exposure control, adjustment of the mA and/or kV according to patient size and/or use of iterative reconstruction technique. COMPARISON:  Head and cervical spine CT today. FINDINGS: Osseous: Chronic TMJ degeneration, but mandible appears intact and aligned. Chronic dental caries. Bilateral maxilla, zygoma, pterygoid and nasal bones appear intact. Visible skull base appears intact. Orbits: Right orbit not completely  included, but visible on head CT today reported separately. Left orbital walls intact. Postoperative changes to both globes which appear intact. Intraorbital soft tissues appear symmetric and normal. Left premalar, inferior preseptal space soft tissue swelling and stranding redemonstrated. No soft tissue gas. Sinuses: Well aerated bilaterally. Soft tissues: Mild motion artifact of the larynx and pharynx. Negative visible noncontrast parapharyngeal spaces, retropharyngeal space, sublingual space, submandibular spaces, masticator and parotid spaces. Limited intracranial: Reported separately. IMPRESSION: 1. Left premalar, inferior preseptal soft tissue injury with hematoma. 2. No facial fracture identified. Electronically Signed   By: VEAR Hurst M.D.   On: 12/26/2023 08:02   CT CERVICAL SPINE WO CONTRAST Result Date: 12/26/2023  CLINICAL DATA:  89 year old male status post unwitnessed fall. On blood thinners. EXAM: CT CERVICAL SPINE WITHOUT CONTRAST TECHNIQUE: Multidetector CT imaging of the cervical spine was performed without intravenous contrast. Multiplanar CT image reconstructions were also generated. RADIATION DOSE REDUCTION: This exam was performed according to the departmental dose-optimization program which includes automated exposure control, adjustment of the mA and/or kV according to patient size and/or use of iterative reconstruction technique. COMPARISON:  CT head and face today.  CTA neck 10/10/2023. FINDINGS: Alignment: Stable straightening of cervical lordosis. Mild dextroconvex rather than levoconvex cervical spine curvature now. Mild chronic and degenerative appearing cervical spine spondylolisthesis. Cervicothoracic junction alignment is within normal limits. Bilateral posterior element alignment is within normal limits. Skull base and vertebrae: Bone mineralization is within normal limits for age. Visualized skull base is intact. No atlanto-occipital dissociation. C1 and C2 appear chronically  degenerated, but intact and aligned. No acute osseous abnormality identified. Soft tissues and spinal canal: No prevertebral fluid or swelling. No visible canal hematoma. Negative visible noncontrast neck soft tissues. Disc levels: Chronic cervical spine degeneration appears stable from the May CTA. Upper chest: Large pleural effusions visible in both lung apices, simple fluid density. Visible upper thoracic levels appear grossly stable and intact. IMPRESSION: 1. No acute traumatic injury identified in the cervical spine. Stable chronic cervical spine degeneration. 2. Large pleural effusions visible in both lung apices. Electronically Signed   By: VEAR Hurst M.D.   On: 12/26/2023 07:59   CT HEAD WO CONTRAST Result Date: 12/26/2023 CLINICAL DATA:  89 year old male status post unwitnessed fall. On blood thinners. EXAM: CT HEAD WITHOUT CONTRAST TECHNIQUE: Contiguous axial images were obtained from the base of the skull through the vertex without intravenous contrast. RADIATION DOSE REDUCTION: This exam was performed according to the departmental dose-optimization program which includes automated exposure control, adjustment of the mA and/or kV according to patient size and/or use of iterative reconstruction technique. COMPARISON:  Brain MRI 10/17/2023 head CT 10/11/2023. FINDINGS: Brain: Stable cerebral volume. No midline shift, ventriculomegaly, mass effect, evidence of mass lesion, intracranial hemorrhage or evidence of cortically based acute infarction. Confluent bilateral periventricular white matter hypodensity, chronic lacunar infarcts in the right basal ganglia. Stable gray-white matter differentiation throughout the brain. Vascular: Calcified atherosclerosis at the skull base. No suspicious intracranial vascular hyperdensity. Skull: Stable.  No acute fracture identified. Sinuses/Orbits: Visualized paranasal sinuses and mastoids are stable and well aerated. Other: Left infraorbital, premalar soft tissue swelling  and stranding in keeping with superficial hematoma/contusion series 4, image 11. Underlying left maxilla and zygoma appear stable and intact. Other orbit and scalp soft tissues appear stable. IMPRESSION: 1. Left infraorbital/premalar soft tissue hematoma. No underlying fracture is identified. 2. No acute intracranial abnormality. Stable CT appearance of chronic small vessel disease. Electronically Signed   By: VEAR Hurst M.D.   On: 12/26/2023 07:56    Patient Profile     89 y.o. male  with a hx of hypertension, complete heart block s/p PPM, paroxysmal atrial fibrillation, diabetes, chronic HFpEF, hyperlipidemia, hypertension, CKD stage 3b, suspected cognitive impairment who is being seen 12/26/2023 for the evaluation of acute on chronic HFpEF at the request of Dr. Cheryle.   Assessment & Plan    #Acute on chronic HFpEF #AKI -Patient had a prolonged hospital stay earlier in May and June due to encephalopathy and was sent to rehab only on as needed Lasix  and had not received any doses of Lasix  at the rehab per the patient's son -Admitted with increased shortness of  breath and lower extremity edema after mechanical fall -BNP elevated to 48 and chest x-ray consistent with bilateral pleural effusions which were new or increased since May -CT of the neck reported out large pleural effusions at the apices -Found to have acute on chronic stage IIIb CKD with serum creatinine 2 (baseline 1.6-1.8) likely related to acute CHF exacerbation -Now placed on Lasix  40 mg IV twice daily -Good UOP overnight putting out 1.8 L and is net -1.56 L since admit -Weight actually increased 3lbs since admit but likely related to different scales since he was in the ER yesterday -Serum creatinine slightly improved from 2->>1.9 today.  Potassium 4.3 -Follow strict I's and O's, daily weights and renal function while diuresing -repeat 2D echo today -still has some mild volume overload -Continue Lasix  40 mg IV twice daily x 1 more  day and then transition back to his original Lasix  dose of 80mg  PO qam and 40mg  PO qPM that he was on when he last saw Cardiology -Transition Lopressor  to Toprol  XL 25 mg daily -Hold off on addition of SGLT2i given underlying CKD   #Paroxysmal atrial fibrillation #Complete heart block status post PPM -Normal sinus rhythm with PACs and V paced rhythm on telemetry -CHA2DS2-VASc score 5 -Continue Eliquis  2.5 mg twice daily (dosed for age > 11 and SCr> 1.5)  -Transition Lopressor  to Toprol  XL 25 mg daily   #Hypertension -BP controlled at 115/68 mmHg -Transition Lopressor  to Toprol  XL 25 mg daily -Continue amlodipine  10 mg daily  I spent 35 minutes caring for this patient today face to face, ordering and reviewing labs, reviewing records from 2D echo from 5/25, seeing the patient, documenting in the record, and arranging for a repeat 2D echo   For questions or updates, please contact [facility] Please consult [email or web address] for contact info under        Signed, Wilbert Bihari, MD  12/27/2023, 9:01 AM

## 2023-12-27 NOTE — Progress Notes (Signed)
 Heart Failure Navigator Progress Note  Assessed for Heart & Vascular TOC clinic readiness.  Patient does not meet criteria due to EF 60-65%, per MD patient with cognitive impairment and early dementia. No HF TOC. .   Navigator will sign off at this time.   Stephane Haddock, BSN, Scientist, clinical (histocompatibility and immunogenetics) Only

## 2023-12-27 NOTE — Progress Notes (Signed)
 Transition of Care [facility]) - CAGE-AID Screening   Patient Details  Name: Connor Dixon MRN: [id number] Date of Birth: 01/10/1935  Darice CHRISTELLA Rouleau, RN Trauma Response Nurse Phone Number: [phone] 12/27/2023, 9:50 AM    CAGE-AID Screening:    Have You Ever Felt You Ought to Cut Down on Your Drinking or Drug Use?: No Have People Annoyed You By Critizing Your Drinking Or Drug Use?: No Have You Felt Bad Or Guilty About Your Drinking Or Drug Use?: No Have You Ever Had a Drink or Used Drugs First Thing In The Morning to Steady Your Nerves or to Get Rid of a Hangover?: No CAGE-AID Score: 0  Substance Abuse Education Offered: (S) No (Denies need for services)

## 2023-12-27 NOTE — Inpatient Diabetes Management (Signed)
 Inpatient Diabetes Program Recommendations  AACE/ADA: New Consensus Statement on Inpatient Glycemic Control   Target Ranges:  Prepandial:   less than 140 mg/dL      Peak postprandial:   less than 180 mg/dL (1-2 hours)      Critically ill patients:  140 - 180 mg/dL    Latest Reference Range & Units 12/26/23 08:44 12/26/23 12:04 12/26/23 17:42 12/26/23 20:58 12/27/23 06:08  Glucose-Capillary 70 - 99 mg/dL 895 (H) 895 (H) 750 (H) 210 (H) 259 (H)    Latest Reference Range & Units 12/26/23 06:49 12/26/23 07:30  Glucose-Capillary 70 - 99 mg/dL 53 (L) 859 (H)   Review of Glycemic Control  Diabetes history: DM2 Outpatient Diabetes medications: 70/30 22 units QAM, 70/30 18 units QPM Current orders for Inpatient glycemic control: Novolog  0-15 units TID with meals, Novolog  0-5 units QHS  Inpatient Diabetes Program Recommendations:    Insulin : CBG 259 mg/dl this morning. Please consider ordering Semglee  9 units Q24H.  Outpatient DM:  Patient inially presented with hypoglycemia. Question if fall related to hypoglycemia.  Given patient is 89 years old, is at risk of falls, and had hypoglycemia, may want to stop 70/30 insulin  outpatient and use Semglee  and Novolog  correction.   Thanks, Earnie Gainer, RN, MSN, CDCES Diabetes Coordinator Inpatient Diabetes Program [phone] (Team Pager from 8am to 5pm)

## 2023-12-27 NOTE — Progress Notes (Signed)
 Patient has an unstageable pressure injury to left trochanter that was noticed on admission by day nurse.  Wound cleansed and foam dressing applied by day shift RN.  Dr. Franky notified to receive orders for wound ostomy nurse consult.

## 2023-12-27 NOTE — Progress Notes (Signed)
 PROGRESS NOTE    LINZIE BOURSIQUOT  FMW:[id number] DOB: 1934/05/20 DOA: 12/26/2023 PCP: [name], MD  89/M with history of hypertension, complete heart block with PPM, paroxysmal A-fib, chronic diastolic CHF, type 2 diabetes mellitus, CKD 3b, cognitive impairment/early dementia presented to the ED from his facility after a fall, while in the emergency room he was noted to be hypoxic, placed on O2, further workup noted bilateral pleural effusions, started on IV Lasix  and admitted   Subjective: -Feels fair, has considerable memory issues  Assessment and Plan:  Acute on chronic diastolic heart failure Acute respiratory failure with hypoxia Bilateral pleural effusions - Previous echo with preserved EF  - Continue IV Lasix  today, transition to oral diuretics in 1 to 2 days, follow-up repeat echo  -GDMT limited by CKD -Continue metoprolol , changed to Toprol    Fall Left wrist pain - Possibly mechanical.  Imaging including CT of the head, cervical spine, maxillofacial negative for any fractures/dislocations.  Left wrist x-ray did not show any fracture or dislocation as well. -Fall precautions.  PT eval  Cognitive deficits,?  Suspected dementia -Ongoing problem, follow-up with [facility]   CKD stage IIIb - Baseline creatinine of 1.6-1.8.  Creatinine now 1.9, monitor   Hypertension Hyperlipidemia - Continue amlodipine , metoprolol  and Lasix    Diabetes mellitus type 2 with hypoglycemia - Carb modified diet.  CBGs improved, now in the 180-250 range, continue SSI today -Check A1c   Paroxysmal A-fib - Currently rate controlled.  Continue Eliquis  and metoprolol    History of complete AV block status post pacemaker -Follow cardiology recommendations   Obesity class I - Outpatient follow-up   DVT prophylaxis: Eliquis  Code Status: Full code Family Communication: No family at bedside will update son Disposition Plan: Back to assisted living, possibly tomorrow  Consultants:     Procedures:   Antimicrobials:    Objective: Vitals:   12/26/23 2356 12/27/23 0431 12/27/23 0828 12/27/23 1123  BP: 112/73 124/76 115/68 123/70  Pulse: 74 98 79 77  Resp: 19 19 16  (!) 21  Temp: 98.1 F (36.7 C) 97.6 F (36.4 C) 97.6 F (36.4 C) (!) 97 F (36.1 C)  TempSrc: Oral Oral Oral Oral  SpO2: 99% 95% 97% 95%  Weight:  91.2 kg    Height:        Intake/Output Summary (Last 24 hours) at 12/27/2023 1156 Last data filed at 12/27/2023 0434 Gross per 24 hour  Intake 240 ml  Output 1800 ml  Net -1560 ml   Filed Weights   12/26/23 0652 12/27/23 0431  Weight: 90 kg 91.2 kg    Examination:  General exam: Appears calm and comfortable, AO x 2, moderate cognitive deficits Respiratory system: Decreased breath sounds at the bases Cardiovascular system: S1 & S2 heard, RRR.  Systolic murmur Abd: nondistended, soft and nontender.Normal bowel sounds heard. Central nervous system: Alert and oriented. No focal neurological deficits. Extremities: Trace edema Skin: No rashes Psychiatry: Poor insight and judgment    Data Reviewed:   CBC: Recent Labs  Lab 12/26/23 0656 12/26/23 0659 12/27/23 0235  WBC  --  6.2 5.7  HGB 13.3 12.8* 11.9*  HCT 39.0 39.2 36.9*  MCV  --  92.2 92.0  PLT  --  234 231   Basic Metabolic Panel: Recent Labs  Lab 12/26/23 0656 12/26/23 0659 12/27/23 0235  NA 137 137 139  K 4.7 4.6 4.3  CL 106 106 104  CO2  --  23 25  GLUCOSE 55* 57* 186*  BUN 32*  25* 24*  CREATININE 2.00* 1.90* 1.90*  CALCIUM  --  8.9 8.9  MG  --   --  2.0   GFR: Estimated Creatinine Clearance: 31.4 mL/min (A) (by C-G formula based on SCr of 1.9 mg/dL (H)). Liver Function Tests: Recent Labs  Lab 12/26/23 0659 12/27/23 0235  AST 25 26  ALT 12 9  ALKPHOS 77 71  BILITOT 1.0 1.4*  PROT 6.4* 5.6*  ALBUMIN 2.7* 2.4*   No results for input(s): LIPASE, AMYLASE in the last 168 hours. No results for input(s): AMMONIA in the last 168 hours. Coagulation  Profile: Recent Labs  Lab 12/26/23 0659  INR 1.1   Cardiac Enzymes: No results for input(s): CKTOTAL, CKMB, CKMBINDEX, TROPONINI in the last 168 hours. BNP (last 3 results) No results for input(s): PROBNP in the last 8760 hours. HbA1C: No results for input(s): HGBA1C in the last 72 hours. CBG: Recent Labs  Lab 12/26/23 0844 12/26/23 1204 12/26/23 1742 12/26/23 2058 12/27/23 0608  GLUCAP 104* 104* 249* 210* 259*   Lipid Profile: No results for input(s): CHOL, HDL, LDLCALC, TRIG, CHOLHDL, LDLDIRECT in the last 72 hours. Thyroid  Function Tests: No results for input(s): TSH, T4TOTAL, FREET4, T3FREE, THYROIDAB in the last 72 hours. Anemia Panel: No results for input(s): VITAMINB12, FOLATE, FERRITIN, TIBC, IRON, RETICCTPCT in the last 72 hours. Urine analysis:    Component Value Date/Time   COLORURINE YELLOW 12/26/2023 0815   APPEARANCEUR CLEAR 12/26/2023 0815   LABSPEC 1.007 12/26/2023 0815   PHURINE 5.0 12/26/2023 0815   GLUCOSEU 50 (A) 12/26/2023 0815   GLUCOSEU 100 (A) 05/08/2023 0833   HGBUR NEGATIVE 12/26/2023 0815   BILIRUBINUR NEGATIVE 12/26/2023 0815   KETONESUR NEGATIVE 12/26/2023 0815   PROTEINUR >=300 (A) 12/26/2023 0815   UROBILINOGEN 0.2 05/08/2023 0833   NITRITE NEGATIVE 12/26/2023 0815   LEUKOCYTESUR NEGATIVE 12/26/2023 0815   Sepsis Labs: @LABRCNTIP (procalcitonin:4,lacticidven:4)  ) Recent Results (from the past 240 hours)  Blood culture (routine x 2)     Status: None (Preliminary result)   Collection Time: 12/26/23  8:46 AM   Specimen: BLOOD RIGHT HAND  Result Value Ref Range Status   Specimen Description BLOOD RIGHT HAND  Final   Special Requests   Final    BOTTLES DRAWN AEROBIC AND ANAEROBIC Blood Culture results may not be optimal due to an inadequate volume of blood received in culture bottles   Culture   Final    NO GROWTH < 24 HOURS Performed at [facility], [address],  [place], [place] [address]    Report Status PENDING  Incomplete  Blood culture (routine x 2)     Status: None (Preliminary result)   Collection Time: 12/26/23  8:46 AM   Specimen: BLOOD RIGHT HAND  Result Value Ref Range Status   Specimen Description BLOOD RIGHT HAND  Final   Special Requests   Final    BOTTLES DRAWN AEROBIC ONLY Blood Culture adequate volume   Culture   Final    NO GROWTH < 24 HOURS Performed at [facility], [address] [place], [place] [address]    Report Status PENDING  Incomplete     Radiology Studies: DG Wrist Complete Left Result Date: 12/26/2023 CLINICAL DATA:  89 year old male status post fall with wrist pain. EXAM: LEFT WRIST - COMPLETE 3+ VIEW COMPARISON:  None Available. FINDINGS: Four views 0804 hours. Calcified peripheral vascular disease. Distal radius and ulna appear intact. Radiocarpal joint space loss at the scaphoid, radial styloid. Subchondral  sclerosis there. Mild widening of the scapholunate interval. Carpal bone alignment otherwise maintained. Proximal metacarpals intact. Less pronounced degeneration at the [facility] joint. No acute fracture or dislocation identified. IMPRESSION: 1. No acute fracture or dislocation identified about the left wrist. But mild widening of the scapholunate interval, can be posttraumatic and predispose to SLAC wrist. 2. Radiocarpal and [facility] joint degeneration. 3. Calcified peripheral vascular disease. Electronically Signed   By: VEAR Hurst M.D.   On: 12/26/2023 09:00   DG Chest Port 1 View Result Date: 12/26/2023 CLINICAL DATA:  89 year old male status post unwitnessed fall. On blood thinners. EXAM: PORTABLE CHEST 1 VIEW COMPARISON:  Cervical spine CT today. Portable chest 10/31/2023 and earlier. FINDINGS: Portable AP semi upright view at 0708 hours. Veiling bilateral lung opacity is new or increased since June. Stable lung volumes. Stable cardiac size and mediastinal contours. Stable left chest dual lead cardiac pacemaker.  No pneumothorax. No air bronchograms. Dense retrocardiac opacity. Pulmonary vascularity is indistinct. Paucity of bowel gas in the visible abdomen. No acute osseous abnormality identified. IMPRESSION: Bilateral pleural effusions, new or increased since June. Confluent bibasilar atelectasis suspected. Cannot exclude underlying interstitial edema. Electronically Signed   By: VEAR Hurst M.D.   On: 12/26/2023 08:04   CT MAXILLOFACIAL WO CONTRAST Result Date: 12/26/2023 CLINICAL DATA:  89 year old male status post unwitnessed fall. On blood thinners. EXAM: CT MAXILLOFACIAL WITHOUT CONTRAST TECHNIQUE: Multidetector CT imaging of the maxillofacial structures was performed. Multiplanar CT image reconstructions were also generated. RADIATION DOSE REDUCTION: This exam was performed according to the departmental dose-optimization program which includes automated exposure control, adjustment of the mA and/or kV according to patient size and/or use of iterative reconstruction technique. COMPARISON:  Head and cervical spine CT today. FINDINGS: Osseous: Chronic TMJ degeneration, but mandible appears intact and aligned. Chronic dental caries. Bilateral maxilla, zygoma, pterygoid and nasal bones appear intact. Visible skull base appears intact. Orbits: Right orbit not completely included, but visible on head CT today reported separately. Left orbital walls intact. Postoperative changes to both globes which appear intact. Intraorbital soft tissues appear symmetric and normal. Left premalar, inferior preseptal space soft tissue swelling and stranding redemonstrated. No soft tissue gas. Sinuses: Well aerated bilaterally. Soft tissues: Mild motion artifact of the larynx and pharynx. Negative visible noncontrast parapharyngeal spaces, retropharyngeal space, sublingual space, submandibular spaces, masticator and parotid spaces. Limited intracranial: Reported separately. IMPRESSION: 1. Left premalar, inferior preseptal soft tissue injury  with hematoma. 2. No facial fracture identified. Electronically Signed   By: VEAR Hurst M.D.   On: 12/26/2023 08:02   CT CERVICAL SPINE WO CONTRAST Result Date: 12/26/2023 CLINICAL DATA:  89 year old male status post unwitnessed fall. On blood thinners. EXAM: CT CERVICAL SPINE WITHOUT CONTRAST TECHNIQUE: Multidetector CT imaging of the cervical spine was performed without intravenous contrast. Multiplanar CT image reconstructions were also generated. RADIATION DOSE REDUCTION: This exam was performed according to the departmental dose-optimization program which includes automated exposure control, adjustment of the mA and/or kV according to patient size and/or use of iterative reconstruction technique. COMPARISON:  CT head and face today.  CTA neck 10/10/2023. FINDINGS: Alignment: Stable straightening of cervical lordosis. Mild dextroconvex rather than levoconvex cervical spine curvature now. Mild chronic and degenerative appearing cervical spine spondylolisthesis. Cervicothoracic junction alignment is within normal limits. Bilateral posterior element alignment is within normal limits. Skull base and vertebrae: Bone mineralization is within normal limits for age. Visualized skull base is intact. No atlanto-occipital dissociation. C1 and C2 appear chronically degenerated, but intact and  aligned. No acute osseous abnormality identified. Soft tissues and spinal canal: No prevertebral fluid or swelling. No visible canal hematoma. Negative visible noncontrast neck soft tissues. Disc levels: Chronic cervical spine degeneration appears stable from the May CTA. Upper chest: Large pleural effusions visible in both lung apices, simple fluid density. Visible upper thoracic levels appear grossly stable and intact. IMPRESSION: 1. No acute traumatic injury identified in the cervical spine. Stable chronic cervical spine degeneration. 2. Large pleural effusions visible in both lung apices. Electronically Signed   By: VEAR Hurst M.D.    On: 12/26/2023 07:59   CT HEAD WO CONTRAST Result Date: 12/26/2023 CLINICAL DATA:  89 year old male status post unwitnessed fall. On blood thinners. EXAM: CT HEAD WITHOUT CONTRAST TECHNIQUE: Contiguous axial images were obtained from the base of the skull through the vertex without intravenous contrast. RADIATION DOSE REDUCTION: This exam was performed according to the departmental dose-optimization program which includes automated exposure control, adjustment of the mA and/or kV according to patient size and/or use of iterative reconstruction technique. COMPARISON:  Brain MRI 10/17/2023 head CT 10/11/2023. FINDINGS: Brain: Stable cerebral volume. No midline shift, ventriculomegaly, mass effect, evidence of mass lesion, intracranial hemorrhage or evidence of cortically based acute infarction. Confluent bilateral periventricular white matter hypodensity, chronic lacunar infarcts in the right basal ganglia. Stable gray-white matter differentiation throughout the brain. Vascular: Calcified atherosclerosis at the skull base. No suspicious intracranial vascular hyperdensity. Skull: Stable.  No acute fracture identified. Sinuses/Orbits: Visualized paranasal sinuses and mastoids are stable and well aerated. Other: Left infraorbital, premalar soft tissue swelling and stranding in keeping with superficial hematoma/contusion series 4, image 11. Underlying left maxilla and zygoma appear stable and intact. Other orbit and scalp soft tissues appear stable. IMPRESSION: 1. Left infraorbital/premalar soft tissue hematoma. No underlying fracture is identified. 2. No acute intracranial abnormality. Stable CT appearance of chronic small vessel disease. Electronically Signed   By: VEAR Hurst M.D.   On: 12/26/2023 07:56     Scheduled Meds:  allopurinol   100 mg Oral Daily   amLODipine   10 mg Oral Daily   apixaban   2.5 mg Oral BID   folic acid   1 mg Oral Daily   furosemide   40 mg Intravenous BID   insulin  aspart  0-15 Units  Subcutaneous TID WC   insulin  aspart  0-5 Units Subcutaneous QHS   leptospermum manuka honey  1 Application Topical Daily   metoprolol  succinate  25 mg Oral Daily   tamsulosin   0.4 mg Oral QPC supper   Continuous Infusions:   LOS: 0 days    Time spent:    Sigurd Pac, MD [facility]   12/27/2023, 11:56 AM

## 2023-12-27 NOTE — Plan of Care (Signed)

## 2023-12-27 NOTE — Progress Notes (Signed)
 Echocardiogram 2D Echocardiogram has been performed.  Damien FALCON Jolana Runkles RDCS 12/27/2023, 11:15 AM

## 2023-12-27 NOTE — Consult Note (Addendum)
 WOC Nurse Consult Note: Reason for Consult: hip wound  Wound type:  Unstageable Pressure Injury L trochanter  Pressure Injury POA: Yes Measurement: see nursing flowsheet  Wound bed: 80% tan fibrinous 20% pink  Drainage (amount, consistency, odor) see nursing flowsheet  Periwound: dark discoloration  Dressing procedure/placement/frequency: Cleanse L trochanter wound with NS, apply Medihoney to wound bed daily, cover with dry gauze and secure with silicone foam.   POC discussed with bedside nurse. WOC team will not follow. Re-consult if further needs arise.   Thank you,    Powell Bar MSN, RN-BC, [employer]

## 2023-12-28 DIAGNOSIS — I5043 Acute on chronic combined systolic (congestive) and diastolic (congestive) heart failure: Secondary | ICD-10-CM

## 2023-12-28 DIAGNOSIS — N179 Acute kidney failure, unspecified: Secondary | ICD-10-CM | POA: Diagnosis not present

## 2023-12-28 DIAGNOSIS — I442 Atrioventricular block, complete: Secondary | ICD-10-CM | POA: Diagnosis not present

## 2023-12-28 DIAGNOSIS — I480 Paroxysmal atrial fibrillation: Secondary | ICD-10-CM | POA: Diagnosis not present

## 2023-12-28 DIAGNOSIS — I5033 Acute on chronic diastolic (congestive) heart failure: Secondary | ICD-10-CM | POA: Diagnosis not present

## 2023-12-28 LAB — BASIC METABOLIC PANEL WITH GFR
Anion gap: 10 (ref 5–15)
BUN: 29 mg/dL — ABNORMAL HIGH (ref 8–23)
CO2: 24 mmol/L (ref 22–32)
Calcium: 8.5 mg/dL — ABNORMAL LOW (ref 8.9–10.3)
Chloride: 102 mmol/L (ref 98–111)
Creatinine, Ser: 2.02 mg/dL — ABNORMAL HIGH (ref 0.61–1.24)
GFR, Estimated: 32 mL/min — ABNORMAL LOW (ref >60)
Glucose, Bld: 233 mg/dL — ABNORMAL HIGH (ref 70–99)
Potassium: 4.2 mmol/L (ref 3.5–5.1)
Sodium: 136 mmol/L (ref 135–145)

## 2023-12-28 LAB — GLUCOSE, CAPILLARY
Glucose-Capillary: 290 mg/dL — ABNORMAL HIGH (ref 70–99)
Glucose-Capillary: 322 mg/dL — ABNORMAL HIGH (ref 70–99)
Glucose-Capillary: 241 mg/dL — ABNORMAL HIGH (ref 70–99)
Glucose-Capillary: 89 mg/dL (ref 70–99)

## 2023-12-28 MED ORDER — SODIUM CHLORIDE 0.9 % IV SOLN: 250.0000 mL | INTRAVENOUS | Status: DC | PRN

## 2023-12-28 MED ORDER — ASPIRIN 81 MG PO CHEW
81.0000 mg | CHEWABLE_TABLET | ORAL | Status: AC
  Administered 2023-12-29: 81 mg via ORAL
  Filled 2023-12-28: qty 1

## 2023-12-28 MED ORDER — AMLODIPINE BESYLATE 5 MG PO TABS: 5.0000 mg | ORAL_TABLET | Freq: Every day | ORAL | Status: DC

## 2023-12-28 MED ORDER — HYDRALAZINE HCL 10 MG PO TABS
10.0000 mg | ORAL_TABLET | Freq: Three times a day (TID) | ORAL | Status: DC
  Administered 2023-12-29: 10 mg via ORAL
  Filled 2023-12-28: qty 1

## 2023-12-28 MED ORDER — SODIUM CHLORIDE 0.9% FLUSH
3.0000 mL | Freq: Two times a day (BID) | INTRAVENOUS | Status: DC
  Administered 2023-12-28 – 2023-12-29 (×2): 3 mL via INTRAVENOUS

## 2023-12-28 MED ORDER — SODIUM CHLORIDE 0.9% FLUSH: 3.0000 mL | INTRAVENOUS | Status: DC | PRN

## 2023-12-28 NOTE — Plan of Care (Signed)
  Problem: Education: Goal: Ability to describe self-care measures that may prevent or decrease complications (Diabetes Survival Skills Education) will improve Outcome: Progressing   Problem: Coping: Goal: Ability to adjust to condition or change in health will improve Outcome: Progressing   Problem: Fluid Volume: Goal: Ability to maintain a balanced intake and output will improve Outcome: Progressing   Problem: Metabolic: Goal: Ability to maintain appropriate glucose levels will improve Outcome: Progressing   Problem: Tissue Perfusion: Goal: Adequacy of tissue perfusion will improve Outcome: Progressing   Problem: Skin Integrity: Goal: Risk for impaired skin integrity will decrease Outcome: Progressing   Problem: Clinical Measurements: Goal: Ability to maintain clinical measurements within normal limits will improve Outcome: Progressing

## 2023-12-28 NOTE — Evaluation (Signed)
 Occupational Therapy Evaluation Patient Details Name: Connor Dixon MRN: [id number] DOB: February 05, 1935 Today's Date: 12/28/2023   History of Present Illness   Pt is an 89 y.o. male who presented 8/12 s/p fall. Imaging including CT of the head, cervical spine, maxillofacial, and L wrist negative for any fractures/dislocations. Pt admitted with possible acute on chronic diastolic heart failure, acute respiratory failure with hypoxia, and bilateral pleural effusions. PMH - afib on Eliquis , pacer, HTN, CKD, DM, obesity, CHF, DM1, HLD     Clinical Impressions This 89 yo male admitted with above presents to acute OT with PLOF being at ALF getting PT services working on ambulation with RW but needing A for this and ADLs (to what extent is unknown). Currently he is setup-Max A for basic ADLs from a RW level and is incontinent of stool. He will continue to benefit from acute OT with follow up HHOT at ALF.     If plan is discharge home, recommend the following:   A little help with walking and/or transfers;A lot of help with bathing/dressing/bathroom;Assistance with cooking/housework;Help with stairs or ramp for entrance;Assist for transportation;Direct supervision/assist for financial management;Direct supervision/assist for medications management     Functional Status Assessment   Patient has had a recent decline in their functional status and demonstrates the ability to make significant improvements in function in a reasonable and predictable amount of time.     Equipment Recommendations   None recommended by OT      Precautions/Restrictions   Precautions Precautions: Fall Recall of Precautions/Restrictions: Impaired Restrictions Weight Bearing Restrictions Per Provider Order: No     Mobility Bed Mobility               General bed mobility comments: pt up in recliner upon my entry    Transfers Overall transfer level: Needs assistance Equipment used: Rolling walker (2  wheels) Transfers: Sit to/from Stand Sit to Stand: Min assist           General transfer comment: VCs for safe hand placement. Without RW in front of him he automatically used the arms of the chair to help him stand up. With RW in front of him he automatically placed both hands up on RW and needed Max redirection to move his hands back to arms of chair      Balance Overall balance assessment: Needs assistance Sitting-balance support: No upper extremity supported, Feet supported Sitting balance-Leahy Scale: Fair     Standing balance support: Bilateral upper extremity supported, During functional activity, Reliant on assistive device for balance Standing balance-Leahy Scale: Poor Standing balance comment: reliant on RW and external physical assistance                           ADL either performed or assessed with clinical judgement   ADL Overall ADL's : Needs assistance/impaired Eating/Feeding: Set up;Sitting   Grooming: Minimal assistance;Sitting   Upper Body Bathing: Minimal assistance;Sitting   Lower Body Bathing: Maximal assistance Lower Body Bathing Details (indicate cue type and reason): min A sit<>stand Upper Body Dressing : Moderate assistance;Sitting   Lower Body Dressing: Maximal assistance Lower Body Dressing Details (indicate cue type and reason): min A sit<>stand Toilet Transfer: Minimal assistance;Ambulation;Rolling walker (2 wheels);Comfort height toilet;Grab bars   Toileting- Clothing Manipulation and Hygiene: Total assistance Toileting - Clothing Manipulation Details (indicate cue type and reason): min A sit<>stand             Vision   Additional Comments:  Needs to be further assessed to see if this is an issue with following directional directions            Pertinent Vitals/Pain Pain Assessment Faces Pain Scale: No hurt     Extremity/Trunk Assessment Upper Extremity Assessment Upper Extremity Assessment: Overall WFL for tasks  assessed           Communication Communication Factors Affecting Communication: Difficulty expressing self   Cognition Arousal: Alert Behavior During Therapy: WFL for tasks assessed/performed Cognition: Cognition impaired     Awareness: Intellectual awareness impaired, Online awareness impaired   Attention impairment (select first level of impairment): Sustained attention Executive functioning impairment (select all impairments): Problem solving OT - Cognition Comments: Pt was asking his son (who lives in [place]) when he was going to be up to see him today--son had to remind his dad that he was in [facility] and could not come see him today; needed step-by-step direction and tactile cues to get to bathroom and back to recliner                 Following commands: Impaired Following commands impaired: Follows one step commands with increased time, Follows one step commands inconsistently     Cueing   Cueing Techniques: Verbal cues;Tactile cues;Gestural cues              Home Living Family/patient expects to be discharged to:: Assisted living                                 Additional Comments: [facility]      Prior Functioning/Environment Prior Level of Function : Needs assist  Cognitive Assist : ADLs (cognitive)   ADLs (Cognitive): Set up cues Physical Assist : ADLs (physical)   ADLs (physical): Bathing;Dressing;Toileting Mobility Comments: Prior to 10/13/23, pt was independent without AD. Since recent admission to SNF, pt has been working with PT on ambulating with a RW. ADLs Comments: Prior to 10/13/23 - Had some assist with medications off and on a couple days a week from a family friend. Sincer recent admission and d/c to SNF pt's cognition has remained impaired per family    OT Problem List: Impaired balance (sitting and/or standing);Decreased cognition;Decreased safety awareness   OT Treatment/Interventions: Self-care/ADL training;DME and/or AE  instruction;Balance training;Patient/family education      OT Goals(Current goals can be found in the care plan section)   Acute Rehab OT Goals Patient Stated Goal: to talk to his sons and for them to come and see him OT Goal Formulation: With patient Time For Goal Achievement: 01/11/24 Potential to Achieve Goals: Fair (due to cognition)   OT Frequency:  Min 2X/week       AM-PAC OT 6 Clicks Daily Activity     Outcome Measure Help from another person eating meals?: A Little Help from another person taking care of personal grooming?: A Little Help from another person toileting, which includes using toliet, bedpan, or urinal?: A Lot Help from another person bathing (including washing, rinsing, drying)?: A Lot Help from another person to put on and taking off regular upper body clothing?: A Lot Help from another person to put on and taking off regular lower body clothing?: A Lot 6 Click Score: 14   End of Session Equipment Utilized During Treatment: Gait belt;Rolling walker (2 wheels) Nurse Communication:  (pt had a moderate bowel movement)  Activity Tolerance: Patient tolerated treatment well Patient left: in chair;with  call bell/phone within reach;with chair alarm set;with family/visitor present  OT Visit Diagnosis: Unsteadiness on feet (R26.81);Other abnormalities of gait and mobility (R26.89);Muscle weakness (generalized) (M62.81);Other symptoms and signs involving cognitive function                Time: 0915-0950 OT Time Calculation (min): 35 min Charges:  OT General Charges $OT Visit: 1 Visit OT Evaluation $OT Eval Moderate Complexity: 1 Mod OT Treatments $Self Care/Home Management : 8-22 mins  Donny BECKER OT Acute Rehabilitation Services Office [phone]    [place] Dorothyann Distel 12/28/2023, 10:54 AM

## 2023-12-28 NOTE — H&P (View-Only) (Signed)
 Progress Note  Patient Name: Connor Dixon Date of Encounter: 12/28/2023  [facility] Cardiologist: None   Patient Profile     Subjective   Shortness of breath continues to improve.  Lower extremity edema is better on diuretic therapy   He put out 1.65 L yesterday and is net -2.6 L since admission Inpatient Medications    Scheduled Meds:  allopurinol   100 mg Oral Daily   amLODipine   10 mg Oral Daily   apixaban   2.5 mg Oral BID   folic acid   1 mg Oral Daily   furosemide   40 mg Intravenous BID   insulin  aspart  0-15 Units Subcutaneous TID WC   insulin  aspart  0-5 Units Subcutaneous QHS   leptospermum manuka honey  1 Application Topical Daily   metoprolol  succinate  25 mg Oral Daily   tamsulosin   0.4 mg Oral QPC supper   Continuous Infusions:  PRN Meds: acetaminophen  **OR** acetaminophen , albuterol , loratadine , ondansetron  **OR** ondansetron  (ZOFRAN ) IV, senna-docusate   Vital Signs    Vitals:   12/27/23 1936 12/28/23 0018 12/28/23 0440 12/28/23 0700  BP: 112/67 118/71 120/69 126/65  Pulse: 89 82 89 90  Resp: 17 19 18 20   Temp: 97.9 F (36.6 C) 98.7 F (37.1 C) 98.6 F (37 C) 98.9 F (37.2 C)  TempSrc: Oral Oral Oral Oral  SpO2: 92% 94% 93%   Weight:   89.7 kg   Height:        Intake/Output Summary (Last 24 hours) at 12/28/2023 9170 Last data filed at 12/28/2023 9244 Gross per 24 hour  Intake 600 ml  Output 1650 ml  Net -1050 ml      12/28/2023    4:40 AM 12/27/2023    4:31 AM 12/26/2023    6:52 AM  Last 3 Weights  Weight (lbs) 197 lb 12 oz 201 lb 1 oz 198 lb 6.6 oz  Weight (kg) 89.7 kg 91.2 kg 90 kg      Telemetry    Normal sinus rhythm with ventricular pacing- Personally Reviewed  ECG    No new EKG to review- Personally Reviewed  Physical Exam   GEN: Well nourished, well developed in no acute distress HEENT: Normal NECK: No JVD; No carotid bruits LYMPHATICS: No lymphadenopathy CARDIAC:RRR, no  rubs, gallops 2/6 systolic murmur at  the right upper sternal border RESPIRATORY:  Clear to auscultation without rales, wheezing or rhonchi  ABDOMEN: Soft, non-tender, non-distended MUSCULOSKELETAL:  No edema; No deformity  SKIN: Warm and dry NEUROLOGIC:  Alert and oriented x 3 PSYCHIATRIC:  Normal affect  Labs    High Sensitivity Troponin:  No results for input(s): TROPONINIHS in the last 720 hours.    Chemistry Recent Labs  Lab 12/26/23 0659 12/27/23 0235 12/28/23 0239  NA 137 139 136  K 4.6 4.3 4.2  CL 106 104 102  CO2 23 25 24   GLUCOSE 57* 186* 233*  BUN 25* 24* 29*  CREATININE 1.90* 1.90* 2.02*  CALCIUM 8.9 8.9 8.5*  PROT 6.4* 5.6*  --   ALBUMIN 2.7* 2.4*  --   AST 25 26  --   ALT 12 9  --   ALKPHOS 77 71  --   BILITOT 1.0 1.4*  --   GFRNONAA 34* 34* 32*  ANIONGAP 8 10 10      Hematology Recent Labs  Lab 12/26/23 0656 12/26/23 0659 12/27/23 0235  WBC  --  6.2 5.7  RBC  --  4.25 4.01*  HGB 13.3 12.8* 11.9*  HCT 39.0 39.2 36.9*  MCV  --  92.2 92.0  MCH  --  30.1 29.7  MCHC  --  32.7 32.2  RDW  --  16.7* 16.7*  PLT  --  234 231    BNP Recent Labs  Lab 12/26/23 0659  BNP 248.8*     DDimer No results for input(s): DDIMER in the last 168 hours.   CHA2DS2-VASc Score = 5   This indicates a 7.2% annual risk of stroke. The patient's score is based upon: CHF History: 1 HTN History: 1 Diabetes History: 1 Stroke History: 0 Vascular Disease History: 0 Age Score: 2 Gender Score: 0      Radiology    ECHOCARDIOGRAM COMPLETE Result Date: 12/27/2023    ECHOCARDIOGRAM REPORT   Patient Name:   SHARRON SIMPSON Date of Exam: 12/27/2023 Medical Rec #:  [id number]      Height:       69.5 in Accession #:    [id number]     Weight:       201.1 lb Date of Birth:  1935/04/22       BSA:          2.081 m Patient Age:    89 years       BP:           124/76 mmHg Patient Gender: M              HR:           81 bpm. Exam Location:  Inpatient Procedure: 2D Echo, Color Doppler, Cardiac Doppler and  Intracardiac            Opacification Agent (Both Spectral and Color Flow Doppler were            utilized during procedure). Indications:    I50.31 Acute diastolic (congestive) heart failure  History:        Patient has prior history of Echocardiogram examinations, most                 recent 10/11/2023. Pacemaker, Arrythmias:Atrial Fibrillation;                 Risk Factors:Hypertension, Diabetes and Dyslipidemia.  Sonographer:    Damien Senior RDCS Referring Phys: SOPHIE MAO IMPRESSIONS  1. Left ventricular ejection fraction, by estimation, is 25 to 30%. The left ventricle has severely decreased function. The left ventricle demonstrates global hypokinesis. Left ventricular diastolic parameters are indeterminate.  2. Right ventricular systolic function is mildly reduced. The right ventricular size is normal. There is normal pulmonary artery systolic pressure. The estimated right ventricular systolic pressure is 33.9 mmHg.  3. Left atrial size was mild to moderately dilated.  4. Right atrial size was mildly dilated.  5. The mitral valve is degenerative. Mild mitral valve regurgitation. Moderate mitral annular calcification.  6. The aortic valve is tricuspid. There is moderate calcification of the aortic valve. Aortic valve regurgitation is not visualized. Aortic valve sclerosis/calcification is present, without any evidence of aortic stenosis.  7. The inferior vena cava is normal in size with <50% respiratory variability, suggesting right atrial pressure of 8 mmHg. FINDINGS  Left Ventricle: Left ventricular ejection fraction, by estimation, is 25 to 30%. The left ventricle has severely decreased function. The left ventricle demonstrates global hypokinesis. Definity  contrast agent was given IV to delineate the left ventricular endocardial borders. The left ventricular internal cavity size was normal in size. There is no left ventricular hypertrophy. Left ventricular diastolic parameters are indeterminate. Right  Ventricle: The right ventricular size is normal. No increase in right ventricular wall thickness. Right ventricular systolic function is mildly reduced. There is normal pulmonary artery systolic pressure. The tricuspid regurgitant velocity is 2.69 m/s, and with an assumed right atrial pressure of 5 mmHg, the estimated right ventricular systolic pressure is 33.9 mmHg. Left Atrium: Left atrial size was mild to moderately dilated. Right Atrium: Right atrial size was mildly dilated. Pericardium: There is no evidence of pericardial effusion. Mitral Valve: The mitral valve is degenerative in appearance. Moderate mitral annular calcification. Mild mitral valve regurgitation. Tricuspid Valve: The tricuspid valve is normal in structure. Tricuspid valve regurgitation is mild. Aortic Valve: The aortic valve is tricuspid. There is moderate calcification of the aortic valve. Aortic valve regurgitation is not visualized. Aortic valve sclerosis/calcification is present, without any evidence of aortic stenosis. Pulmonic Valve: The pulmonic valve was grossly normal. Pulmonic valve regurgitation is not visualized. Aorta: The aortic root and ascending aorta are structurally normal, with no evidence of dilitation. Venous: The inferior vena cava is normal in size with less than 50% respiratory variability, suggesting right atrial pressure of 8 mmHg. IAS/Shunts: The atrial septum is grossly normal. Additional Comments: A device lead is visualized in the right atrium and right ventricle.  LEFT VENTRICLE PLAX 2D LVIDd:         4.75 cm LVIDs:         4.00 cm LV PW:         0.90 cm LV IVS:        0.85 cm LVOT diam:     2.20 cm LV SV:         57 LV SV Index:   27 LVOT Area:     3.80 cm  LV Volumes (MOD) LV vol d, MOD A2C: 139.0 ml LV vol d, MOD A4C: 112.0 ml LV vol s, MOD A2C: 101.0 ml LV vol s, MOD A4C: 94.9 ml LV SV MOD A2C:     38.0 ml LV SV MOD A4C:     112.0 ml LV SV MOD BP:      28.4 ml RIGHT VENTRICLE RV S prime:     8.16 cm/s TAPSE  (M-mode): 1.8 cm LEFT ATRIUM             Index        RIGHT ATRIUM           Index LA diam:        4.10 cm 1.97 cm/m   RA Area:     22.80 cm LA Vol (A2C):   78.1 ml 37.53 ml/m  RA Volume:   66.30 ml  31.86 ml/m LA Vol (A4C):   74.3 ml 35.71 ml/m LA Biplane Vol: 78.2 ml 37.58 ml/m  AORTIC VALVE LVOT Vmax:   64.70 cm/s LVOT Vmean:  44.100 cm/s LVOT VTI:    0.149 m  AORTA Ao Root diam: 2.80 cm Ao Asc diam:  3.10 cm TRICUSPID VALVE TR Peak grad:   28.9 mmHg TR Vmax:        269.00 cm/s  SHUNTS Systemic VTI:  0.15 m Systemic Diam: 2.20 cm Toribio Fuel MD Electronically signed by Toribio Fuel MD Signature Date/Time: 12/27/2023/2:53:03 PM    Final     Patient Profile     89 y.o. male  with a hx of hypertension, complete heart block s/p PPM, paroxysmal atrial fibrillation, diabetes, chronic HFpEF, hyperlipidemia, hypertension, CKD stage 3b, suspected cognitive impairment who is being seen 12/26/2023 for the evaluation of acute  on chronic HFpEF at the request of Dr. Cheryle.   Assessment & Plan    #Acute on chronic combined systolic/diastolic #AKI -Patient had a prolonged hospital stay earlier in May and June due to encephalopathy and was sent to rehab only on as needed Lasix  and had not received any doses of Lasix  at the rehab per the patient's son -Admitted with increased shortness of breath and lower extremity edema after mechanical fall -BNP elevated to 48 and chest x-ray consistent with bilateral pleural effusions which were new or increased since May -CT of the neck reported out large pleural effusions at the apices -Found to have acute on chronic stage IIIb CKD with serum creatinine 2 (baseline 1.6-1.8) likely related to acute CHF exacerbation -Now placed on Lasix  40 mg IV twice daily -Good UOP with 1.6 L UOP yesterday and net -2.6 L since admission  -Weight down 4lbs from admission -Not much change in serum creatinine over the past few days 2->>1.9 ->> 1.9->>2.02 today -Follow strict I's  and O's, daily weights and renal function while diuresing -Repeat 2D echo shows significantly worsening LV function with EF 25 to 30% with global HK, mild RV dysfunction, mild MR>> unclear etiology of acute worsening in the past several months -He still has some mild ankle edema -Continue Lasix  40 mg IV twice daily  -I think he would benefit from right heart cath to assess filling pressures -Cath Lab is full today so we will make n.p.o. after midnight for cath tomorrow -Informed Consent   Shared Decision Making/Informed Consent The risks, including but not limited to, [bleeding or vascular complications (1 in 500), pneumothorax (1 in 1600), arrhythmia (1 in 1000) and death (1 in 5000)], benefits (diagnostic support and/or management of heart failure, pulmonary hypertension) and alternatives of a right heart catheterization were discussed in detail with Mr. Shughart and he is willing to proceed. -Would also benefit from left heart cath to define coronary anatomy but with his worsening renal function I do not think he is an adequate candidate at this time for left heart cath -Continue Toprol  XL 25 mg daily -Hold off on addition of SGLT2i given underlying CKD -GDMT limited by AKI on CKD>> no ACE/ARB/ARNI/MRA/SGLT2i at this time -Will start on hydralazine  10 mg 3 times daily -Decrease amlodipine  to 5 mg daily with a goal of weaning off amlodipine  and titrating up hydralazine  and addition of Imdur as BP allows -Unclear etiology of decline in EF and could be due to underlying CAD.  No recent viral syndrome.   -Given his baseline CKD and advanced age would hold off on left heart cath at this time.  He is not a surgical candidate for CABG if found to have three-vessel CAD given his advanced age, underlying cognitive issues and CKD.   -Can Consider Lexiscan Myoview  prior to discharge and if found to have a high risk scan with a large area of myocardium that possibly could be addressed with PCI then could  consider minimal contrast left heart cath   #Paroxysmal atrial fibrillation #Complete heart block status post PPM -Normal sinus rhythm with PACs and V paced rhythm on telemetry -CHA2DS2-VASc score 5 -Continue Eliquis  2.5 mg twice daily (dosed for age > 80 and SCr> 1.5)  -Continue Toprol  XL 25 mg daily   #Hypertension - BP controlled at 126/65 mmHg today - Continue Toprol  XL 25 mg daily - Decrease amlodipine  to 5 mg daily and add on hydralazine  10 mg 3 times daily for LV dysfunction  I spent  45 minutes caring for this patient today face to face, ordering and reviewing labs, reviewing records from 2D echo from 5/25 and this admission, seeing the patient, documenting in the record, and arranging for a right heart cath and discussing his case at length with both his sons today  For questions or updates, please contact [facility] Please consult [email or web address] for contact info under        Signed, Wilbert Bihari, MD  12/28/2023, 8:29 AM

## 2023-12-28 NOTE — Progress Notes (Addendum)
 PROGRESS NOTE    Connor Dixon  FMW:[id number] DOB: 1934-12-24 DOA: 12/26/2023 PCP: [name], MD  89/M with history of hypertension, complete heart block with PPM, paroxysmal A-fib, chronic diastolic CHF, type 2 diabetes mellitus, CKD 3b, cognitive impairment/early dementia presented to the ED from his facility after a fall, while in the emergency room he was noted to be hypoxic, placed on O2, further workup noted bilateral pleural effusions, started on IV Lasix  and admitted - Echo noted worsening cardiomyopathy, EF now down to 25-30%, cards following  Subjective: - Feels well, no complaints this morning  Assessment and Plan:  Acute systolic and diastolic CHF Acute respiratory failure with hypoxia Bilateral pleural effusions - Previous echo with preserved EF  -Repeat echo with EF down to 25-30%, mildly reduced RV - Continue IV Lasix  today, transition to oral diuretics in 1 to 2 days,  -GDMT limited by CKD, DC amlodipine , starting hydralazine  today - Continue Toprol , cards following, recommended right heart cath   Fall Left wrist pain - Possibly mechanical.  Imaging including CT of the head, cervical spine, maxillofacial negative for any fractures/dislocations.  Left wrist x-ray did not show any fracture or dislocation as well. -Fall precautions.  PT eval, supportive care  Cognitive deficits,?  Suspected dementia -Ongoing problem, follow-up with [facility]   CKD stage IIIb - Baseline creatinine of 1.6-1.8.  Creatinine now 1.9-2   Hypertension Hyperlipidemia - Continue amlodipine , metoprolol  and Lasix    Diabetes mellitus type 2 with hypoglycemia - Carb modified diet.  CBGs improved, now in the 180-250 range, continue SSI today -Check A1c   Paroxysmal A-fib - Currently rate controlled.  Continue Eliquis  and metoprolol    History of complete AV block status post pacemaker -Follow cardiology recommendations   Obesity class I - Outpatient follow-up   DVT  prophylaxis: Eliquis  Code Status: Full code Family Communication: No family at bedside, updated son Disposition Plan: Back to assisted living, possibly tomorrow  Consultants: Cards   Procedures:   Antimicrobials:    Objective: Vitals:   12/28/23 0018 12/28/23 0440 12/28/23 0700 12/28/23 1110  BP: 118/71 120/69 126/65 112/65  Pulse: 82 89 90 83  Resp: 19 18 20 20   Temp: 98.7 F (37.1 C) 98.6 F (37 C) 98.9 F (37.2 C) 97.9 F (36.6 C)  TempSrc: Oral Oral Oral Oral  SpO2: 94% 93%  95%  Weight:  89.7 kg    Height:        Intake/Output Summary (Last 24 hours) at 12/28/2023 1242 Last data filed at 12/28/2023 1100 Gross per 24 hour  Intake 480 ml  Output 2050 ml  Net -1570 ml   Filed Weights   12/26/23 0652 12/27/23 0431 12/28/23 0440  Weight: 90 kg 91.2 kg 89.7 kg    Examination:  General exam: Appears calm and comfortable, AO x 2, moderate cognitive deficits Respiratory system: Decreased breath sounds at the bases Cardiovascular system: S1 & S2 heard, RRR.  Systolic murmur Abd: nondistended, soft and nontender.Normal bowel sounds heard. Central nervous system: Alert and oriented. No focal neurological deficits. Extremities: Trace edema Skin: No rashes Psychiatry: Poor insight and judgment    Data Reviewed:   CBC: Recent Labs  Lab 12/26/23 0656 12/26/23 0659 12/27/23 0235  WBC  --  6.2 5.7  HGB 13.3 12.8* 11.9*  HCT 39.0 39.2 36.9*  MCV  --  92.2 92.0  PLT  --  234 231   Basic Metabolic Panel: Recent Labs  Lab 12/26/23 0656 12/26/23 0659 12/27/23 0235 12/28/23  0239  NA 137 137 139 136  K 4.7 4.6 4.3 4.2  CL 106 106 104 102  CO2  --  23 25 24   GLUCOSE 55* 57* 186* 233*  BUN 32* 25* 24* 29*  CREATININE 2.00* 1.90* 1.90* 2.02*  CALCIUM  --  8.9 8.9 8.5*  MG  --   --  2.0  --    GFR: Estimated Creatinine Clearance: 29.3 mL/min (A) (by C-G formula based on SCr of 2.02 mg/dL (H)). Liver Function Tests: Recent Labs  Lab 12/26/23 0659  12/27/23 0235  AST 25 26  ALT 12 9  ALKPHOS 77 71  BILITOT 1.0 1.4*  PROT 6.4* 5.6*  ALBUMIN 2.7* 2.4*   No results for input(s): LIPASE, AMYLASE in the last 168 hours. No results for input(s): AMMONIA in the last 168 hours. Coagulation Profile: Recent Labs  Lab 12/26/23 0659  INR 1.1   Cardiac Enzymes: No results for input(s): CKTOTAL, CKMB, CKMBINDEX, TROPONINI in the last 168 hours. BNP (last 3 results) No results for input(s): PROBNP in the last 8760 hours. HbA1C: No results for input(s): HGBA1C in the last 72 hours. CBG: Recent Labs  Lab 12/27/23 1158 12/27/23 1622 12/27/23 2100 12/28/23 0613 12/28/23 1156  GLUCAP 172* 239* 281* 290* 322*   Lipid Profile: No results for input(s): CHOL, HDL, LDLCALC, TRIG, CHOLHDL, LDLDIRECT in the last 72 hours. Thyroid  Function Tests: No results for input(s): TSH, T4TOTAL, FREET4, T3FREE, THYROIDAB in the last 72 hours. Anemia Panel: No results for input(s): VITAMINB12, FOLATE, FERRITIN, TIBC, IRON, RETICCTPCT in the last 72 hours. Urine analysis:    Component Value Date/Time   COLORURINE YELLOW 12/26/2023 0815   APPEARANCEUR CLEAR 12/26/2023 0815   LABSPEC 1.007 12/26/2023 0815   PHURINE 5.0 12/26/2023 0815   GLUCOSEU 50 (A) 12/26/2023 0815   GLUCOSEU 100 (A) 05/08/2023 0833   HGBUR NEGATIVE 12/26/2023 0815   BILIRUBINUR NEGATIVE 12/26/2023 0815   KETONESUR NEGATIVE 12/26/2023 0815   PROTEINUR >=300 (A) 12/26/2023 0815   UROBILINOGEN 0.2 05/08/2023 0833   NITRITE NEGATIVE 12/26/2023 0815   LEUKOCYTESUR NEGATIVE 12/26/2023 0815   Sepsis Labs: @LABRCNTIP (procalcitonin:4,lacticidven:4)  ) Recent Results (from the past 240 hours)  Blood culture (routine x 2)     Status: None (Preliminary result)   Collection Time: 12/26/23  8:46 AM   Specimen: BLOOD RIGHT HAND  Result Value Ref Range Status   Specimen Description BLOOD RIGHT HAND  Final   Special Requests   Final     BOTTLES DRAWN AEROBIC AND ANAEROBIC Blood Culture results may not be optimal due to an inadequate volume of blood received in culture bottles   Culture   Final    NO GROWTH 2 DAYS Performed at [facility], [address], [place], [place] [address]    Report Status PENDING  Incomplete  Blood culture (routine x 2)     Status: None (Preliminary result)   Collection Time: 12/26/23  8:46 AM   Specimen: BLOOD RIGHT HAND  Result Value Ref Range Status   Specimen Description BLOOD RIGHT HAND  Final   Special Requests   Final    BOTTLES DRAWN AEROBIC ONLY Blood Culture adequate volume   Culture   Final    NO GROWTH 2 DAYS Performed at [facility], [address], [place], [place] [address]    Report Status PENDING  Incomplete     Radiology Studies: ECHOCARDIOGRAM COMPLETE Result Date: 12/27/2023    ECHOCARDIOGRAM REPORT   Patient Name:  Connor Dixon Date of Exam: 12/27/2023 Medical Rec #:  [id number]      Height:       69.5 in Accession #:    [id number]     Weight:       201.1 lb Date of Birth:  1935-01-02       BSA:          2.081 m Patient Age:    88 years       BP:           124/76 mmHg Patient Gender: M              HR:           81 bpm. Exam Location:  Inpatient Procedure: 2D Echo, Color Doppler, Cardiac Doppler and Intracardiac            Opacification Agent (Both Spectral and Color Flow Doppler were            utilized during procedure). Indications:    I50.31 Acute diastolic (congestive) heart failure  History:        Patient has prior history of Echocardiogram examinations, most                 recent 10/11/2023. Pacemaker, Arrythmias:Atrial Fibrillation;                 Risk Factors:Hypertension, Diabetes and Dyslipidemia.  Sonographer:    Damien Senior RDCS Referring Phys: SOPHIE MAO IMPRESSIONS  1. Left ventricular ejection fraction, by estimation, is 25 to 30%. The left ventricle has severely decreased function. The left ventricle demonstrates global hypokinesis. Left  ventricular diastolic parameters are indeterminate.  2. Right ventricular systolic function is mildly reduced. The right ventricular size is normal. There is normal pulmonary artery systolic pressure. The estimated right ventricular systolic pressure is 33.9 mmHg.  3. Left atrial size was mild to moderately dilated.  4. Right atrial size was mildly dilated.  5. The mitral valve is degenerative. Mild mitral valve regurgitation. Moderate mitral annular calcification.  6. The aortic valve is tricuspid. There is moderate calcification of the aortic valve. Aortic valve regurgitation is not visualized. Aortic valve sclerosis/calcification is present, without any evidence of aortic stenosis.  7. The inferior vena cava is normal in size with <50% respiratory variability, suggesting right atrial pressure of 8 mmHg. FINDINGS  Left Ventricle: Left ventricular ejection fraction, by estimation, is 25 to 30%. The left ventricle has severely decreased function. The left ventricle demonstrates global hypokinesis. Definity  contrast agent was given IV to delineate the left ventricular endocardial borders. The left ventricular internal cavity size was normal in size. There is no left ventricular hypertrophy. Left ventricular diastolic parameters are indeterminate. Right Ventricle: The right ventricular size is normal. No increase in right ventricular wall thickness. Right ventricular systolic function is mildly reduced. There is normal pulmonary artery systolic pressure. The tricuspid regurgitant velocity is 2.69 m/s, and with an assumed right atrial pressure of 5 mmHg, the estimated right ventricular systolic pressure is 33.9 mmHg. Left Atrium: Left atrial size was mild to moderately dilated. Right Atrium: Right atrial size was mildly dilated. Pericardium: There is no evidence of pericardial effusion. Mitral Valve: The mitral valve is degenerative in appearance. Moderate mitral annular calcification. Mild mitral valve regurgitation.  Tricuspid Valve: The tricuspid valve is normal in structure. Tricuspid valve regurgitation is mild. Aortic Valve: The aortic valve is tricuspid. There is moderate calcification of the aortic valve. Aortic valve regurgitation is not visualized. Aortic valve sclerosis/calcification  is present, without any evidence of aortic stenosis. Pulmonic Valve: The pulmonic valve was grossly normal. Pulmonic valve regurgitation is not visualized. Aorta: The aortic root and ascending aorta are structurally normal, with no evidence of dilitation. Venous: The inferior vena cava is normal in size with less than 50% respiratory variability, suggesting right atrial pressure of 8 mmHg. IAS/Shunts: The atrial septum is grossly normal. Additional Comments: A device lead is visualized in the right atrium and right ventricle.  LEFT VENTRICLE PLAX 2D LVIDd:         4.75 cm LVIDs:         4.00 cm LV PW:         0.90 cm LV IVS:        0.85 cm LVOT diam:     2.20 cm LV SV:         57 LV SV Index:   27 LVOT Area:     3.80 cm  LV Volumes (MOD) LV vol d, MOD A2C: 139.0 ml LV vol d, MOD A4C: 112.0 ml LV vol s, MOD A2C: 101.0 ml LV vol s, MOD A4C: 94.9 ml LV SV MOD A2C:     38.0 ml LV SV MOD A4C:     112.0 ml LV SV MOD BP:      28.4 ml RIGHT VENTRICLE RV S prime:     8.16 cm/s TAPSE (M-mode): 1.8 cm LEFT ATRIUM             Index        RIGHT ATRIUM           Index LA diam:        4.10 cm 1.97 cm/m   RA Area:     22.80 cm LA Vol (A2C):   78.1 ml 37.53 ml/m  RA Volume:   66.30 ml  31.86 ml/m LA Vol (A4C):   74.3 ml 35.71 ml/m LA Biplane Vol: 78.2 ml 37.58 ml/m  AORTIC VALVE LVOT Vmax:   64.70 cm/s LVOT Vmean:  44.100 cm/s LVOT VTI:    0.149 m  AORTA Ao Root diam: 2.80 cm Ao Asc diam:  3.10 cm TRICUSPID VALVE TR Peak grad:   28.9 mmHg TR Vmax:        269.00 cm/s  SHUNTS Systemic VTI:  0.15 m Systemic Diam: 2.20 cm Toribio Fuel MD Electronically signed by Toribio Fuel MD Signature Date/Time: 12/27/2023/2:53:03 PM    Final       Scheduled Meds:  allopurinol   100 mg Oral Daily   [START ON 12/29/2023] amLODipine   5 mg Oral Daily   apixaban   2.5 mg Oral BID   folic acid   1 mg Oral Daily   furosemide   40 mg Intravenous BID   [START ON 12/29/2023] hydrALAZINE   10 mg Oral Q8H   insulin  aspart  0-15 Units Subcutaneous TID WC   insulin  aspart  0-5 Units Subcutaneous QHS   leptospermum manuka honey  1 Application Topical Daily   metoprolol  succinate  25 mg Oral Daily   tamsulosin   0.4 mg Oral QPC supper   Continuous Infusions:   LOS: 1 day    Time spent:    Sigurd Pac, MD [facility]   12/28/2023, 12:42 PM

## 2023-12-28 NOTE — TOC Progression Note (Addendum)
 Transition of Care [facility]) - Progression Note    Patient Details  Name: Connor Dixon MRN: [id number] Date of Birth: May 03, 1935  [facility] CM/SW Contact  Luise JAYSON Pan, [place] Phone Number: 12/28/2023, 8:38 AM  Clinical Narrative:   CSW returned call from Weldon, pts son. [name] asked CSW if patient will discharge today. CSW informed [name] that CSW is unsure as progression meeting has not happened. [name] informed CSW that family discussed discharge to facility with MD yesterday. CSW will follow up. [name] inquired about transportation back to facility as he is out of town. CSW informed him that CSW can arrange non emergency ambulance transportation. [name] asked for CSW to follow up with ALF about transportation.   CSW spoke with Therisa, in admissions at ALF, about facility providing transportation back to ALF from the hospital. Therisa stated they do not provide that service and that typically ambulance transportation will drop patients off (or family). CSW to update family.   10:20 AM Per MD, MD spoke to patients family about current medical status. Family decided on patient receiving right heart cath today. Patient will not be discharging at this time.   CSW left [name] a vm about transportation at time of discharge.   12:13 PM CSW spoke with [name] about transportation back to ALF. [name] is agreeable to patient going back via ambulance as ALF requested and family is out of town.   CSW will continue to follow.    Expected Discharge Plan: Assisted Living Barriers to Discharge: Continued Medical Work up               Expected Discharge Plan and Services In-house Referral: Clinical Social Work Discharge Planning Services: CM Consult   Living arrangements for the past 2 months: Assisted Living Facility                                       Social Drivers of Health (SDOH) Interventions SDOH Screenings   Food Insecurity: Patient Unable To Answer  (12/26/2023)  Housing: Unknown (12/26/2023)  Transportation Needs: Patient Unable To Answer (12/26/2023)  Utilities: Patient Unable To Answer (12/26/2023)  Alcohol  Screen: Low Risk  (04/06/2023)  Depression (PHQ2-9): Low Risk  (09/06/2023)  Financial Resource Strain: Low Risk  (04/06/2023)  Physical Activity: Inactive (04/06/2023)  Social Connections: Unknown (12/26/2023)  Stress: No Stress Concern Present (04/06/2023)  Tobacco Use: Low Risk  (12/26/2023)  Health Literacy: Adequate Health Literacy (04/06/2023)    Readmission Risk Interventions     No data to display

## 2023-12-28 NOTE — Progress Notes (Addendum)
 Progress Note  Patient Name: Connor Dixon Date of Encounter: 12/28/2023  [facility] Cardiologist: None   Patient Profile     Subjective   Shortness of breath continues to improve.  Lower extremity edema is better on diuretic therapy   He put out 1.65 L yesterday and is net -2.6 L since admission Inpatient Medications    Scheduled Meds:  allopurinol   100 mg Oral Daily   amLODipine   10 mg Oral Daily   apixaban   2.5 mg Oral BID   folic acid   1 mg Oral Daily   furosemide   40 mg Intravenous BID   insulin  aspart  0-15 Units Subcutaneous TID WC   insulin  aspart  0-5 Units Subcutaneous QHS   leptospermum manuka honey  1 Application Topical Daily   metoprolol  succinate  25 mg Oral Daily   tamsulosin   0.4 mg Oral QPC supper   Continuous Infusions:  PRN Meds: acetaminophen  **OR** acetaminophen , albuterol , loratadine , ondansetron  **OR** ondansetron  (ZOFRAN ) IV, senna-docusate   Vital Signs    Vitals:   12/27/23 1936 12/28/23 0018 12/28/23 0440 12/28/23 0700  BP: 112/67 118/71 120/69 126/65  Pulse: 89 82 89 90  Resp: 17 19 18 20   Temp: 97.9 F (36.6 C) 98.7 F (37.1 C) 98.6 F (37 C) 98.9 F (37.2 C)  TempSrc: Oral Oral Oral Oral  SpO2: 92% 94% 93%   Weight:   89.7 kg   Height:        Intake/Output Summary (Last 24 hours) at 12/28/2023 9170 Last data filed at 12/28/2023 9244 Gross per 24 hour  Intake 600 ml  Output 1650 ml  Net -1050 ml      12/28/2023    4:40 AM 12/27/2023    4:31 AM 12/26/2023    6:52 AM  Last 3 Weights  Weight (lbs) 197 lb 12 oz 201 lb 1 oz 198 lb 6.6 oz  Weight (kg) 89.7 kg 91.2 kg 90 kg      Telemetry    Normal sinus rhythm with ventricular pacing- Personally Reviewed  ECG    No new EKG to review- Personally Reviewed  Physical Exam   GEN: Well nourished, well developed in no acute distress HEENT: Normal NECK: No JVD; No carotid bruits LYMPHATICS: No lymphadenopathy CARDIAC:RRR, no  rubs, gallops 2/6 systolic murmur at  the right upper sternal border RESPIRATORY:  Clear to auscultation without rales, wheezing or rhonchi  ABDOMEN: Soft, non-tender, non-distended MUSCULOSKELETAL:  No edema; No deformity  SKIN: Warm and dry NEUROLOGIC:  Alert and oriented x 3 PSYCHIATRIC:  Normal affect  Labs    High Sensitivity Troponin:  No results for input(s): TROPONINIHS in the last 720 hours.    Chemistry Recent Labs  Lab 12/26/23 0659 12/27/23 0235 12/28/23 0239  NA 137 139 136  K 4.6 4.3 4.2  CL 106 104 102  CO2 23 25 24   GLUCOSE 57* 186* 233*  BUN 25* 24* 29*  CREATININE 1.90* 1.90* 2.02*  CALCIUM 8.9 8.9 8.5*  PROT 6.4* 5.6*  --   ALBUMIN 2.7* 2.4*  --   AST 25 26  --   ALT 12 9  --   ALKPHOS 77 71  --   BILITOT 1.0 1.4*  --   GFRNONAA 34* 34* 32*  ANIONGAP 8 10 10      Hematology Recent Labs  Lab 12/26/23 0656 12/26/23 0659 12/27/23 0235  WBC  --  6.2 5.7  RBC  --  4.25 4.01*  HGB 13.3 12.8* 11.9*  HCT 39.0 39.2 36.9*  MCV  --  92.2 92.0  MCH  --  30.1 29.7  MCHC  --  32.7 32.2  RDW  --  16.7* 16.7*  PLT  --  234 231    BNP Recent Labs  Lab 12/26/23 0659  BNP 248.8*     DDimer No results for input(s): DDIMER in the last 168 hours.   CHA2DS2-VASc Score = 5   This indicates a 7.2% annual risk of stroke. The patient's score is based upon: CHF History: 1 HTN History: 1 Diabetes History: 1 Stroke History: 0 Vascular Disease History: 0 Age Score: 2 Gender Score: 0      Radiology    ECHOCARDIOGRAM COMPLETE Result Date: 12/27/2023    ECHOCARDIOGRAM REPORT   Patient Name:   Connor Dixon Date of Exam: 12/27/2023 Medical Rec #:  [id number]      Height:       69.5 in Accession #:    [id number]     Weight:       201.1 lb Date of Birth:  [birth year]       BSA:          2.081 m Patient Age:    89 years       BP:           124/76 mmHg Patient Gender: M              HR:           81 bpm. Exam Location:  Inpatient Procedure: 2D Echo, Color Doppler, Cardiac Doppler and  Intracardiac            Opacification Agent (Both Spectral and Color Flow Doppler were            utilized during procedure). Indications:    I50.31 Acute diastolic (congestive) heart failure  History:        Patient has prior history of Echocardiogram examinations, most                 recent 10/11/2023. Pacemaker, Arrythmias:Atrial Fibrillation;                 Risk Factors:Hypertension, Diabetes and Dyslipidemia.  Sonographer:    Damien Senior RDCS Referring Phys: SOPHIE MAO IMPRESSIONS  1. Left ventricular ejection fraction, by estimation, is 25 to 30%. The left ventricle has severely decreased function. The left ventricle demonstrates global hypokinesis. Left ventricular diastolic parameters are indeterminate.  2. Right ventricular systolic function is mildly reduced. The right ventricular size is normal. There is normal pulmonary artery systolic pressure. The estimated right ventricular systolic pressure is 33.9 mmHg.  3. Left atrial size was mild to moderately dilated.  4. Right atrial size was mildly dilated.  5. The mitral valve is degenerative. Mild mitral valve regurgitation. Moderate mitral annular calcification.  6. The aortic valve is tricuspid. There is moderate calcification of the aortic valve. Aortic valve regurgitation is not visualized. Aortic valve sclerosis/calcification is present, without any evidence of aortic stenosis.  7. The inferior vena cava is normal in size with <50% respiratory variability, suggesting right atrial pressure of 8 mmHg. FINDINGS  Left Ventricle: Left ventricular ejection fraction, by estimation, is 25 to 30%. The left ventricle has severely decreased function. The left ventricle demonstrates global hypokinesis. Definity  contrast agent was given IV to delineate the left ventricular endocardial borders. The left ventricular internal cavity size was normal in size. There is no left ventricular hypertrophy. Left ventricular diastolic parameters are indeterminate. Right  Ventricle: The right ventricular size is normal. No increase in right ventricular wall thickness. Right ventricular systolic function is mildly reduced. There is normal pulmonary artery systolic pressure. The tricuspid regurgitant velocity is 2.69 m/s, and with an assumed right atrial pressure of 5 mmHg, the estimated right ventricular systolic pressure is 33.9 mmHg. Left Atrium: Left atrial size was mild to moderately dilated. Right Atrium: Right atrial size was mildly dilated. Pericardium: There is no evidence of pericardial effusion. Mitral Valve: The mitral valve is degenerative in appearance. Moderate mitral annular calcification. Mild mitral valve regurgitation. Tricuspid Valve: The tricuspid valve is normal in structure. Tricuspid valve regurgitation is mild. Aortic Valve: The aortic valve is tricuspid. There is moderate calcification of the aortic valve. Aortic valve regurgitation is not visualized. Aortic valve sclerosis/calcification is present, without any evidence of aortic stenosis. Pulmonic Valve: The pulmonic valve was grossly normal. Pulmonic valve regurgitation is not visualized. Aorta: The aortic root and ascending aorta are structurally normal, with no evidence of dilitation. Venous: The inferior vena cava is normal in size with less than 50% respiratory variability, suggesting right atrial pressure of 8 mmHg. IAS/Shunts: The atrial septum is grossly normal. Additional Comments: A device lead is visualized in the right atrium and right ventricle.  LEFT VENTRICLE PLAX 2D LVIDd:         4.75 cm LVIDs:         4.00 cm LV PW:         0.90 cm LV IVS:        0.85 cm LVOT diam:     2.20 cm LV SV:         57 LV SV Index:   27 LVOT Area:     3.80 cm  LV Volumes (MOD) LV vol d, MOD A2C: 139.0 ml LV vol d, MOD A4C: 112.0 ml LV vol s, MOD A2C: 101.0 ml LV vol s, MOD A4C: 94.9 ml LV SV MOD A2C:     38.0 ml LV SV MOD A4C:     112.0 ml LV SV MOD BP:      28.4 ml RIGHT VENTRICLE RV S prime:     8.16 cm/s TAPSE  (M-mode): 1.8 cm LEFT ATRIUM             Index        RIGHT ATRIUM           Index LA diam:        4.10 cm 1.97 cm/m   RA Area:     22.80 cm LA Vol (A2C):   78.1 ml 37.53 ml/m  RA Volume:   66.30 ml  31.86 ml/m LA Vol (A4C):   74.3 ml 35.71 ml/m LA Biplane Vol: 78.2 ml 37.58 ml/m  AORTIC VALVE LVOT Vmax:   64.70 cm/s LVOT Vmean:  44.100 cm/s LVOT VTI:    0.149 m  AORTA Ao Root diam: 2.80 cm Ao Asc diam:  3.10 cm TRICUSPID VALVE TR Peak grad:   28.9 mmHg TR Vmax:        269.00 cm/s  SHUNTS Systemic VTI:  0.15 m Systemic Diam: 2.20 cm Toribio Fuel MD Electronically signed by Toribio Fuel MD Signature Date/Time: 12/27/2023/2:53:03 PM    Final     Patient Profile     89 y.o. male  with a hx of hypertension, complete heart block s/p PPM, paroxysmal atrial fibrillation, diabetes, chronic HFpEF, hyperlipidemia, hypertension, CKD stage 3b, suspected cognitive impairment who is being seen 12/26/2023 for the evaluation of acute  on chronic HFpEF at the request of Dr. Cheryle.   Assessment & Plan    #Acute on chronic combined systolic/diastolic #AKI -Patient had a prolonged hospital stay earlier in May and June due to encephalopathy and was sent to rehab only on as needed Lasix  and had not received any doses of Lasix  at the rehab per the patient's son -Admitted with increased shortness of breath and lower extremity edema after mechanical fall -BNP elevated to 48 and chest x-ray consistent with bilateral pleural effusions which were new or increased since May -CT of the neck reported out large pleural effusions at the apices -Found to have acute on chronic stage IIIb CKD with serum creatinine 2 (baseline 1.6-1.8) likely related to acute CHF exacerbation -Now placed on Lasix  40 mg IV twice daily -Good UOP with 1.6 L UOP yesterday and net -2.6 L since admission  -Weight down 4lbs from admission -Not much change in serum creatinine over the past few days 2->>1.9 ->> 1.9->>2.02 today -Follow strict I's  and O's, daily weights and renal function while diuresing -Repeat 2D echo shows significantly worsening LV function with EF 25 to 30% with global HK, mild RV dysfunction, mild MR>> unclear etiology of acute worsening in the past several months -He still has some mild ankle edema -Continue Lasix  40 mg IV twice daily  -I think he would benefit from right heart cath to assess filling pressures -Cath Lab is full today so we will make n.p.o. after midnight for cath tomorrow -Informed Consent   Shared Decision Making/Informed Consent The risks, including but not limited to, [bleeding or vascular complications (1 in 500), pneumothorax (1 in 1600), arrhythmia (1 in 1000) and death (1 in 5000)], benefits (diagnostic support and/or management of heart failure, pulmonary hypertension) and alternatives of a right heart catheterization were discussed in detail with Connor Dixon and he is willing to proceed. -Would also benefit from left heart cath to define coronary anatomy but with his worsening renal function I do not think he is an adequate candidate at this time for left heart cath -Continue Toprol  XL 25 mg daily -Hold off on addition of SGLT2i given underlying CKD -GDMT limited by AKI on CKD>> no ACE/ARB/ARNI/MRA/SGLT2i at this time -Will start on hydralazine  10 mg 3 times daily -Decrease amlodipine  to 5 mg daily with a goal of weaning off amlodipine  and titrating up hydralazine  and addition of Imdur as BP allows -Unclear etiology of decline in EF and could be due to underlying CAD.  No recent viral syndrome.   -Given his baseline CKD and advanced age would hold off on left heart cath at this time.  He is not a surgical candidate for CABG if found to have three-vessel CAD given his advanced age, underlying cognitive issues and CKD.   -Can Consider Lexiscan Myoview  prior to discharge and if found to have a high risk scan with a large area of myocardium that possibly could be addressed with PCI then could  consider minimal contrast left heart cath   #Paroxysmal atrial fibrillation #Complete heart block status post PPM -Normal sinus rhythm with PACs and V paced rhythm on telemetry -CHA2DS2-VASc score 5 -Continue Eliquis  2.5 mg twice daily (dosed for age > 67 and SCr> 1.5)  -Continue Toprol  XL 25 mg daily   #Hypertension - BP controlled at 126/65 mmHg today - Continue Toprol  XL 25 mg daily - Decrease amlodipine  to 5 mg daily and add on hydralazine  10 mg 3 times daily for LV dysfunction  I spent  45 minutes caring for this patient today face to face, ordering and reviewing labs, reviewing records from 2D echo from 5/25 and this admission, seeing the patient, documenting in the record, and arranging for a right heart cath and discussing his case at length with both his sons today  For questions or updates, please contact [facility] Please consult [email or web address] for contact info under        Signed, Wilbert Bihari, MD  12/28/2023, 8:29 AM

## 2023-12-28 NOTE — Inpatient Diabetes Management (Signed)
 Inpatient Diabetes Program Recommendations  AACE/ADA: New Consensus Statement on Inpatient Glycemic Control   Target Ranges:  Prepandial:   less than 140 mg/dL      Peak postprandial:   less than 180 mg/dL (1-2 hours)      Critically ill patients:  140 - 180 mg/dL    Latest Reference Range & Units 12/27/23 06:08 12/27/23 11:58 12/27/23 16:22 12/27/23 21:00 12/28/23 06:13  Glucose-Capillary 70 - 99 mg/dL 740 (H) 827 (H) 760 (H) 281 (H) 290 (H)   Review of Glycemic Control  Diabetes history: DM2 Outpatient Diabetes medications: 70/30 22 units QAM, 70/30 18 units QPM Current orders for Inpatient glycemic control: Novolog  0-15 units TID with meals, Novolog  0-5 units QHS   Inpatient Diabetes Program Recommendations:     Insulin : CBG 259 mg/dl this morning. Please consider ordering Semglee  9 units Q24H.   Outpatient DM:  Patient inially presented with hypoglycemia. Question if fall related to hypoglycemia.  Given patient is 89 years old, is at risk of falls, and had hypoglycemia, may want to stop 70/30 insulin  outpatient and use Semglee  and Novolog  correction.   Thanks, Earnie Gainer, RN, MSN, CDCES Diabetes Coordinator Inpatient Diabetes Program [phone] (Team Pager from 8am to 5pm)

## 2023-12-29 ENCOUNTER — Encounter (HOSPITAL_COMMUNITY)
Admission: RE | Disposition: A | Discharge status: Skilled Nursing Facility | Payer: Self-pay | Source: Home / Self Care | Attending: Internal Medicine

## 2023-12-29 ENCOUNTER — Ambulatory Visit (HOSPITAL_COMMUNITY): Admission: RE | Admit: 2023-12-29 | Source: Home / Self Care | Admitting: Internal Medicine

## 2023-12-29 DIAGNOSIS — I5023 Acute on chronic systolic (congestive) heart failure: Secondary | ICD-10-CM

## 2023-12-29 DIAGNOSIS — I5033 Acute on chronic diastolic (congestive) heart failure: Secondary | ICD-10-CM | POA: Diagnosis not present

## 2023-12-29 DIAGNOSIS — I5043 Acute on chronic combined systolic (congestive) and diastolic (congestive) heart failure: Secondary | ICD-10-CM

## 2023-12-29 HISTORY — PX: RIGHT HEART CATH: CATH118263

## 2023-12-29 LAB — POCT I-STAT EG7
Acid-Base Excess: 3 mmol/L — ABNORMAL HIGH (ref 0.0–2.0)
Acid-Base Excess: 3 mmol/L — ABNORMAL HIGH (ref 0.0–2.0)
Acid-Base Excess: 3 mmol/L — ABNORMAL HIGH (ref 0.0–2.0)
Bicarbonate: 27.1 mmol/L (ref 20.0–28.0)
Bicarbonate: 27.6 mmol/L (ref 20.0–28.0)
Bicarbonate: 27.4 mmol/L (ref 20.0–28.0)
Calcium, Ion: 1.15 mmol/L (ref 1.15–1.40)
Calcium, Ion: 1.17 mmol/L (ref 1.15–1.40)
Calcium, Ion: 1.14 mmol/L — ABNORMAL LOW (ref 1.15–1.40)
HCT: 35 % — ABNORMAL LOW (ref 39.0–52.0)
HCT: 36 % — ABNORMAL LOW (ref 39.0–52.0)
HCT: 36 % — ABNORMAL LOW (ref 39.0–52.0)
Hemoglobin: 11.9 g/dL — ABNORMAL LOW (ref 13.0–17.0)
Hemoglobin: 12.2 g/dL — ABNORMAL LOW (ref 13.0–17.0)
Hemoglobin: 12.2 g/dL — ABNORMAL LOW (ref 13.0–17.0)
O2 Saturation: 65 %
O2 Saturation: 71 %
O2 Saturation: 68 %
Potassium: 3.4 mmol/L — ABNORMAL LOW (ref 3.5–5.1)
Potassium: 3.5 mmol/L (ref 3.5–5.1)
Potassium: 3.4 mmol/L — ABNORMAL LOW (ref 3.5–5.1)
Sodium: 138 mmol/L (ref 135–145)
Sodium: 140 mmol/L (ref 135–145)
Sodium: 139 mmol/L (ref 135–145)
TCO2: 28 mmol/L (ref 22–32)
TCO2: 29 mmol/L (ref 22–32)
TCO2: 29 mmol/L (ref 22–32)
pCO2, Ven: 40 mmHg — ABNORMAL LOW (ref 44–60)
pCO2, Ven: 41.5 mmHg — ABNORMAL LOW (ref 44–60)
pCO2, Ven: 42.4 mmHg — ABNORMAL LOW (ref 44–60)
pH, Ven: 7.43 (ref 7.25–7.43)
pH, Ven: 7.438 — ABNORMAL HIGH (ref 7.25–7.43)
pH, Ven: 7.418 (ref 7.25–7.43)
pO2, Ven: 33 mmHg (ref 32–45)
pO2, Ven: 36 mmHg (ref 32–45)
pO2, Ven: 35 mmHg (ref 32–45)

## 2023-12-29 LAB — BASIC METABOLIC PANEL WITH GFR
Anion gap: 13 (ref 5–15)
BUN: 31 mg/dL — ABNORMAL HIGH (ref 8–23)
CO2: 23 mmol/L (ref 22–32)
Calcium: 8.3 mg/dL — ABNORMAL LOW (ref 8.9–10.3)
Chloride: 99 mmol/L (ref 98–111)
Creatinine, Ser: 2.26 mg/dL — ABNORMAL HIGH (ref 0.61–1.24)
GFR, Estimated: 28 mL/min — ABNORMAL LOW (ref >60)
Glucose, Bld: 191 mg/dL — ABNORMAL HIGH (ref 70–99)
Potassium: 4.2 mmol/L (ref 3.5–5.1)
Sodium: 135 mmol/L (ref 135–145)

## 2023-12-29 LAB — GLUCOSE, CAPILLARY
Glucose-Capillary: 262 mg/dL — ABNORMAL HIGH (ref 70–99)
Glucose-Capillary: 221 mg/dL — ABNORMAL HIGH (ref 70–99)
Glucose-Capillary: 266 mg/dL — ABNORMAL HIGH (ref 70–99)

## 2023-12-29 LAB — SURGICAL PCR SCREEN
MRSA, PCR: POSITIVE — AB
Staphylococcus aureus: POSITIVE — AB

## 2023-12-29 SURGERY — RIGHT HEART CATH
Anesthesia: LOCAL

## 2023-12-29 MED ORDER — LABETALOL HCL 5 MG/ML IV SOLN: 10.0000 mg | INTRAVENOUS | Status: AC | PRN

## 2023-12-29 MED ORDER — LIDOCAINE HCL (PF) 1 % IJ SOLN
INTRAMUSCULAR | Status: DC | PRN
  Administered 2023-12-29: 2 mL via INTRADERMAL

## 2023-12-29 MED ORDER — SODIUM CHLORIDE 0.9% FLUSH: 3.0000 mL | INTRAVENOUS | Status: DC | PRN

## 2023-12-29 MED ORDER — HYDRALAZINE HCL 20 MG/ML IJ SOLN: 10.0000 mg | INTRAMUSCULAR | Status: AC | PRN

## 2023-12-29 MED ORDER — SODIUM CHLORIDE 0.9 % IV SOLN: 250.0000 mL | INTRAVENOUS | Status: DC | PRN

## 2023-12-29 MED ORDER — ACETAMINOPHEN 325 MG PO TABS
650.0000 mg | ORAL_TABLET | ORAL | Status: DC | PRN
Start: 2023-12-29 — End: 2023-12-29

## 2023-12-29 MED ORDER — METOPROLOL SUCCINATE ER 25 MG PO TB24: 25.0000 mg | ORAL_TABLET | Freq: Every day | ORAL | 0 refills | Status: DC

## 2023-12-29 MED ORDER — HEPARIN (PORCINE) IN NACL 1000-0.9 UT/500ML-% IV SOLN
INTRAVENOUS | Status: DC | PRN
  Administered 2023-12-29: 500 mL

## 2023-12-29 MED ORDER — HYDRALAZINE HCL 10 MG PO TABS: 10.0000 mg | ORAL_TABLET | Freq: Three times a day (TID) | ORAL | 0 refills | Status: DC

## 2023-12-29 MED ORDER — HYDRALAZINE HCL 10 MG PO TABS
10.0000 mg | ORAL_TABLET | Freq: Three times a day (TID) | ORAL | Status: DC
  Administered 2023-12-29: 10 mg via ORAL
  Filled 2023-12-29: qty 1

## 2023-12-29 MED ORDER — CHLORHEXIDINE GLUCONATE CLOTH 2 % EX PADS
6.0000 | MEDICATED_PAD | Freq: Every day | CUTANEOUS | Status: DC
  Administered 2023-12-29: 6 via TOPICAL

## 2023-12-29 MED ORDER — ENOXAPARIN SODIUM 40 MG/0.4ML IJ SOSY: 40.0000 mg | PREFILLED_SYRINGE | INTRAMUSCULAR | Status: DC

## 2023-12-29 MED ORDER — SODIUM CHLORIDE 0.9% FLUSH
3.0000 mL | Freq: Two times a day (BID) | INTRAVENOUS | Status: DC
  Administered 2023-12-29: 3 mL via INTRAVENOUS

## 2023-12-29 MED ORDER — TORSEMIDE 20 MG PO TABS: 20.0000 mg | ORAL_TABLET | Freq: Every day | ORAL | 0 refills | Status: DC

## 2023-12-29 MED ORDER — MUPIROCIN 2 % EX OINT
1.0000 | TOPICAL_OINTMENT | Freq: Two times a day (BID) | CUTANEOUS | Status: DC
  Administered 2023-12-29: 1 via NASAL
  Filled 2023-12-29: qty 22

## 2023-12-29 SURGICAL SUPPLY — 6 items
CATH SWAN GANZ 7F STRAIGHT (CATHETERS) IMPLANT
GLIDESHEATH SLENDER 7FR .021G (SHEATH) IMPLANT
GUIDEWIRE .025 260CM (WIRE) IMPLANT
PACK CARDIAC CATHETERIZATION (CUSTOM PROCEDURE TRAY) ×1 IMPLANT
TRANSDUCER W/STOPCOCK (MISCELLANEOUS) IMPLANT
TUBING ART PRESS 72 MALE/FEM (TUBING) IMPLANT

## 2023-12-29 NOTE — Interval H&P Note (Signed)
 History and Physical Interval Note:  12/29/2023 8:49 AM  Connor Dixon  has presented today for surgery, with the diagnosis of chf.  The various methods of treatment have been discussed with the patient and family. After consideration of risks, benefits and other options for treatment, the patient has consented to  Procedure(s): RIGHT HEART CATH (N/A) as a surgical intervention.  The patient's history has been reviewed, patient examined, no change in status, stable for surgery.  I have reviewed the patient's chart and labs.  Questions were answered to the patient's satisfaction.     [name]

## 2023-12-29 NOTE — Progress Notes (Signed)
 Physical Therapy Treatment Patient Details Name: Connor Dixon MRN: [id number] DOB: 06-03-1934 Today's Date: 12/29/2023   History of Present Illness Pt is an 89 y.o. male who presented 8/12 s/p fall. Imaging including CT of the head, cervical spine, maxillofacial, and L wrist negative for any fractures/dislocations. Pt admitted with possible acute on chronic diastolic heart failure, acute respiratory failure with hypoxia, and bilateral pleural effusions. S/p cardiac cath 8/15. PMH - afib on Eliquis , pacer, HTN, CKD, DM, obesity, CHF, DM1, HLD    PT Comments  The pt is making gradual progress in regards to activity tolerance, ambulating an increased distance of up to ~80 ft this date. He is displaying continued deficits in lower extremity strength and power, needing modA to transfer to stand. He is also continuing to demonstrate deficits in cognition, not recalling one moment to the next where to place his hands for transfers, even though performed serial sit <> stands and had to repeat the same cues each rep. He remains at high risk for falls. Will continue to follow acutely.    If plan is discharge home, recommend the following: A little help with bathing/dressing/bathroom;Assistance with cooking/housework;Direct supervision/assist for medications management;Direct supervision/assist for financial management;Help with stairs or ramp for entrance;Assist for transportation;Supervision due to cognitive status;A lot of help with walking and/or transfers   Can travel by private vehicle     Yes  Equipment Recommendations  Other (comment) (defer to next venue of care)    Recommendations for Other Services       Precautions / Restrictions Precautions Precautions: Fall Recall of Precautions/Restrictions: Impaired Restrictions Weight Bearing Restrictions Per Provider Order: No     Mobility  Bed Mobility               General bed mobility comments: Pt up in recliner upon arrival and at  end of session.    Transfers Overall transfer level: Needs assistance Equipment used: Rolling walker (2 wheels) Transfers: Sit to/from Stand Sit to Stand: Mod assist           General transfer comment: Pt needed repeated cues to place one hand on the chair to push up to stand each rep, not recalling these cues one moment to the next with serial reps. ModA needed to power up to stand from recliner, x6 reps total (x5 being serial reps).    Ambulation/Gait Ambulation/Gait assistance: Min assist Gait Distance (Feet): 80 Feet Assistive device: Rolling walker (2 wheels) Gait Pattern/deviations: Step-through pattern, Decreased step length - right, Decreased step length - left, Decreased stride length, Trunk flexed Gait velocity: reduced Gait velocity interpretation: <1.31 ft/sec, indicative of household ambulator   General Gait Details: Pt takes slow, small steps with RW support and minA for balance. Verbal cues provided for feet clearance and increased step length, mod success noted. Verbal and tactile cues provided for upright posture.   Stairs             Wheelchair Mobility     Tilt Bed    Modified Rankin (Stroke Patients Only) Modified Rankin (Stroke Patients Only) Pre-Morbid Rankin Score: Moderately severe disability Modified Rankin: Moderately severe disability     Balance Overall balance assessment: Needs assistance Sitting-balance support: No upper extremity supported, Feet supported Sitting balance-Leahy Scale: Fair     Standing balance support: Bilateral upper extremity supported, During functional activity, Reliant on assistive device for balance Standing balance-Leahy Scale: Poor Standing balance comment: reliant on RW  Communication Communication Communication: No apparent difficulties  Cognition Arousal: Alert Behavior During Therapy: WFL for tasks assessed/performed   PT - Cognitive impairments: History of  cognitive impairments                       PT - Cognition Comments: Pt not able to recall where his hand needs to be for transfers one moment to the next, even though repeated multiple times. Pt not comprehending to place hand on the chair, instead often placing it on the RW, asking Here?. Pt with decreased insight into his deficits, stating he is going home no matter what they say today. Following commands: Impaired Following commands impaired: Follows one step commands with increased time, Follows one step commands inconsistently    Cueing Cueing Techniques: Verbal cues, Tactile cues, Visual cues  Exercises      General Comments        Pertinent Vitals/Pain Pain Assessment Pain Assessment: Faces Faces Pain Scale: No hurt Pain Intervention(s): Monitored during session    Home Living                          Prior Function            PT Goals (current goals can now be found in the care plan section) Acute Rehab PT Goals Patient Stated Goal: to go home PT Goal Formulation: With patient Time For Goal Achievement: 01/10/24 Potential to Achieve Goals: Good Progress towards PT goals: Progressing toward goals    Frequency    Min 2X/week      PT Plan      Co-evaluation              AM-PAC PT 6 Clicks Mobility   Outcome Measure  Help needed turning from your back to your side while in a flat bed without using bedrails?: A Little Help needed moving from lying on your back to sitting on the side of a flat bed without using bedrails?: A Little Help needed moving to and from a bed to a chair (including a wheelchair)?: A Little Help needed standing up from a chair using your arms (e.g., wheelchair or bedside chair)?: A Lot Help needed to walk in hospital room?: A Little Help needed climbing 3-5 steps with a railing? : Total 6 Click Score: 15    End of Session Equipment Utilized During Treatment: Gait belt Activity Tolerance: Patient  tolerated treatment well Patient left: in chair;with call bell/phone within reach;with chair alarm set Nurse Communication: Other (comment) (RN cleared pt for session s/p cardiac cath, pt already mobilized and up in chair earlier after cardiac cath) PT Visit Diagnosis: Unsteadiness on feet (R26.81);Other abnormalities of gait and mobility (R26.89);Muscle weakness (generalized) (M62.81);Difficulty in walking, not elsewhere classified (R26.2)     Time: 8664-8650 PT Time Calculation (min) (ACUTE ONLY): 14 min  Charges:    $Therapeutic Activity: 8-22 mins PT General Charges $$ ACUTE PT VISIT: 1 Visit                     Theo Ferretti, PT, DPT Acute Rehabilitation Services  Office: [phone]    Theo CHRISTELLA Ferretti 12/29/2023, 1:56 PM

## 2023-12-29 NOTE — Final Progress Note (Signed)
 Patient report given to med tech Latanya at [facility]. Verdel LOISE Shams, RN

## 2023-12-29 NOTE — Plan of Care (Signed)
  Problem: Education: Goal: Ability to describe self-care measures that may prevent or decrease complications (Diabetes Survival Skills Education) will improve Outcome: Progressing   Problem: Coping: Goal: Ability to adjust to condition or change in health will improve Outcome: Progressing   Problem: Fluid Volume: Goal: Ability to maintain a balanced intake and output will improve Outcome: Progressing   Problem: Health Behavior/Discharge Planning: Goal: Ability to manage health-related needs will improve Outcome: Progressing   Problem: Health Behavior/Discharge Planning: Goal: Ability to identify and utilize available resources and services will improve Outcome: Progressing   Problem: Nutritional: Goal: Maintenance of adequate nutrition will improve Outcome: Progressing   Problem: Metabolic: Goal: Ability to maintain appropriate glucose levels will improve Outcome: Progressing

## 2023-12-29 NOTE — Progress Notes (Signed)
 Case discussed with Dr. Fairy.  Given RV, will start 20 mg torsemide  daily with close follow up in our clinic. May ultimately need 40 mg torsemide  depending on renal function, will need BMP in 1 week.   I will arrange follow up appt.    Jon Garre Jaymie Misch, PA-C 12/29/2023, 2:42 PM [phone]

## 2023-12-29 NOTE — [place] FL2 (Signed)
 [place]  MEDICAID FL2 LEVEL OF CARE FORM     IDENTIFICATION  Patient Name: Connor Dixon Birthdate: 1934/07/19 Sex: male Admission Date (Current Location): 12/26/2023  [facility] and [place] Number:  Producer, television/film/video and Address:  [facility], [address], [place], [place] [address]      Provider Number: [id number]  Attending Physician Name and Address:  Fairy Frames, MD  Relative Name and Phone Number:  BERLEY, GAMBRELL) [phone] ; JOHNPATRICK, JENNY (son) [phone]    Current Level of Care: Hospital Recommended Level of Care: Assisted Living Facility Prior Approval Number:    Date Approved/Denied:   PASRR Number: [id number]  Discharge Plan: Other (Comment) (Assisted Living Facility)    Current Diagnoses: Patient Active Problem List   Diagnosis Date Noted   Acute exacerbation of CHF (congestive heart failure) (HCC) 12/27/2023   Acute on chronic heart failure with preserved ejection fraction (HFpEF) (HCC) 12/26/2023   AKI (acute kidney injury) (HCC) 12/26/2023   Heart block AV complete (HCC) 12/26/2023   Palliative care by specialist 10/16/2023   Acute encephalopathy 10/10/2023   Elevated troponin 10/10/2023   Prolonged QT interval 10/10/2023   CKD stage 4 due to type 2 diabetes mellitus (HCC) 09/06/2023   Seasonal allergic rhinitis due to pollen 09/06/2023   Post-traumatic osteoarthritis of left shoulder 01/05/2023   PAF (paroxysmal atrial fibrillation) (HCC) 09/03/2021   Diabetic nephropathy associated with type 2 diabetes mellitus (HCC) 06/09/2021   Vitamin D  deficiency disease 06/09/2021   Insulin -requiring or dependent type II diabetes mellitus (HCC) 06/09/2021   Encounter for general adult medical examination with abnormal findings 06/09/2020   Sinus node dysfunction (HCC) 02/13/2019   History of permanent cardiac pacemaker placement 07/22/2010   Hyperlipidemia with target LDL less than 70 08/06/2007   Primary hypertension  08/06/2007    Orientation RESPIRATION BLADDER Height & Weight     Self  Normal External catheter, Continent Weight: 198 lb 3.1 oz (89.9 kg) Height:  5' 9.5 (176.5 cm)  BEHAVIORAL SYMPTOMS/MOOD NEUROLOGICAL BOWEL NUTRITION STATUS      Incontinent Diet (Low-sodium, heart healthy)  AMBULATORY STATUS COMMUNICATION OF NEEDS Skin     Verbally PU Stage and Appropriate Care (Pressure Injury Ischial tuberosity Left Unstageable)                       Personal Care Assistance Level of Assistance  Bathing, Dressing, Feeding Bathing Assistance: Maximum assistance Feeding assistance: Limited assistance Dressing Assistance: Maximum assistance       Functional Limitations Info  Sight, Hearing, Speech Sight Info: Adequate Hearing Info: Adequate Speech Info: Adequate    SPECIAL CARE FACTORS FREQUENCY       PT Frequency: 1x/wk OT Frequency: 1x/wk                 Contractures Contractures Info: Not present    Additional Factors Info  Code Status, Allergies Code Status Info: Full Allergies Info: Oysters (Shellfish Allergy), Citrus, Pecan Extract, Cabbage, Other, Apple Juice, Farxiga  (Dapagliflozin )           Discharge Medications: STOP taking these medications     amLODipine  10 MG tablet Commonly known as: NORVASC     metoprolol  tartrate 25 MG tablet Commonly known as: LOPRESSOR            TAKE these medications     acetaminophen  325 MG tablet Commonly known as: TYLENOL  Take 650 mg by mouth every 8 (eight) hours as needed for moderate pain (pain score 4-6).  allopurinol  100 MG tablet Commonly known as: ZYLOPRIM  TAKE 1 TABLET(100 MG) BY MOUTH DAILY    apixaban  2.5 MG Tabs tablet Commonly known as: Eliquis  Take 1 tablet (2.5 mg total) by mouth 2 (two) times daily.    calcitRIOL  0.25 MCG capsule Commonly known as: ROCALTROL  TAKE 1 CAPSULE BY MOUTH DAILY RETURN IN ABOUT 6 MONTHS(AROUND 02/04/2023)    cyanocobalamin  1000 MCG tablet Take 1 tablet (1,000 mcg  total) by mouth daily.    Dexcom G7 Sensor Misc 1 Act by Does not apply route daily.    feeding supplement (GLUCERNA SHAKE) Liqd Take 237 mLs by mouth daily. What changed: when to take this    ferrous sulfate  325 (65 FE) MG tablet Take 1 tablet (325 mg total) by mouth daily with breakfast.    folic acid  1 MG tablet Commonly known as: FOLVITE  Take 1 tablet (1 mg total) by mouth daily.    Gvoke HypoPen  2-Pack 1 MG/0.2ML Soaj Generic drug: Glucagon  Inject 1 Act into the skin daily as needed.    HumuLIN  70/30 KwikPen (70-30) 100 UNIT/ML KwikPen Generic drug: insulin  isophane & regular human KwikPen Inject 15 Units into the skin in the morning and at bedtime. 40 units in AM and 25 units in PM What changed:  how much to take additional instructions    hydrALAZINE  10 MG tablet Commonly known as: APRESOLINE  Take 1 tablet (10 mg total) by mouth 3 (three) times daily.    levocetirizine 5 MG tablet Commonly known as: XYZAL  Take 1 tablet (5 mg total) by mouth every evening.    loratadine  10 MG tablet Commonly known as: CLARITIN  Take 10 mg by mouth daily as needed for allergies.    metoprolol  succinate 25 MG 24 hr tablet Commonly known as: TOPROL -XL Take 1 tablet (25 mg total) by mouth daily. Start taking on: December 30, 2023    tamsulosin  0.4 MG Caps capsule Commonly known as: FLOMAX  Take 1 capsule (0.4 mg total) by mouth daily.    torsemide  20 MG tablet Commonly known as: DEMADEX  Take 1 tablet (20 mg total) by mouth daily. Give extra dose for weight gain of 3lb in 1 day or 5lb in 1 week Start taking on: December 30, 2023    Vitamin D3 250 MCG (10000 UT) capsule Take 10,000 Units by mouth daily.       Relevant Imaging Results:  Relevant Lab Results:   Additional Information SS#: [id number]  Luise JAYSON Pan, LCSWA

## 2023-12-29 NOTE — Progress Notes (Signed)
 AVS completed for discharge packet and placed with chart.

## 2023-12-29 NOTE — Plan of Care (Addendum)
 Plan of care is reviewed. Pt is progressing. He is stable hemodynamically, afebrile, on room air SPO2 92-95%, normal respiratory effort, no acute distress.  He has no complaints, able to sleep well overnight. No acute distress noted. We will continue to monitor.  Problem: Health Behavior/Discharge Planning: Goal: Ability to identify and utilize available resources and services will improve Outcome: Progressing Goal: Ability to manage health-related needs will improve Outcome: Progressing   Problem: Metabolic: Goal: Ability to maintain appropriate glucose levels will improve Outcome: Progressing   Problem: Skin Integrity: Goal: Risk for impaired skin integrity will decrease Outcome: Progressing   Problem: Tissue Perfusion: Goal: Adequacy of tissue perfusion will improve Outcome: Progressing   Problem: Clinical Measurements: Goal: Ability to maintain clinical measurements within normal limits will improve Outcome: Progressing Goal: Will remain free from infection Outcome: Progressing Goal: Diagnostic test results will improve Outcome: Progressing Goal: Respiratory complications will improve Outcome: Progressing Goal: Cardiovascular complication will be avoided Outcome: Progressing   Wendi Dash, RN

## 2023-12-29 NOTE — Discharge Summary (Addendum)
 Physician Discharge Summary  Connor Dixon FMW:[id number] DOB: 1934-12-12 DOA: 12/26/2023  PCP: [name], MD  Admit date: 12/26/2023 Discharge date: 12/29/2023  Time spent: 45 minutes  Recommendations for Outpatient Follow-up:  [facility] in 1-2weeks, titrate Diuretics at FU Pls check BMP in 1 week [facility] Dr.Jaffe in 2-3weeks   Discharge Diagnoses:  Principal Problem:   Acute on chronic systolic and diastolic CHF AKI/CKD 3B Cognitive deficits Memory loss   Primary hypertension   History of permanent cardiac pacemaker placement   PAF (paroxysmal atrial fibrillation) (HCC)   AKI (acute kidney injury) (HCC)   Heart block AV complete (HCC)   Acute exacerbation of CHF (congestive heart failure) (HCC)   Discharge Condition: Improved  Diet recommendation: Low-sodium, heart healthy  Filed Weights   12/27/23 0431 12/28/23 0440 12/29/23 0416  Weight: 91.2 kg 89.7 kg 89.9 kg    History of present illness:  89/M with history of hypertension, complete heart block with PPM, paroxysmal A-fib, chronic diastolic CHF, type 2 diabetes mellitus, CKD 3b, cognitive impairment/early dementia presented to the ED from his facility after a fall, while in the emergency room he was noted to be hypoxic, placed on O2, further workup noted bilateral pleural effusions, started on IV Lasix  and admitted - Echo noted worsening cardiomyopathy, EF now down to 25-30%, cards following  Hospital Course:   Acute systolic and diastolic CHF Acute respiratory failure with hypoxia - Previous echo with preserved EF  -Repeat echo with EF down to 25-30%, mildly reduced RV - Diuresed with IV Lasix , volume status has improved -GDMT limited by CKD, DC amlodipine , started hydralazine  - Metoprolol  changed to Toprol -XL, cards following, recommended Right heart cath which noted normal left-sided filling pressures and cardiac output, mildly elevated right-sided pressures -Clinically improved,  transition to torsemide  20mg  daily at DC, add further GDMT down the road if kidney fxn improves. -will arrange close FU with [facility]   Fall Left wrist pain - Possibly mechanical.  Imaging including CT of the head, cervical spine, maxillofacial negative for any fractures/dislocations.  Left wrist x-ray did not show any fracture or dislocation as well. - Improved with supportive care   Memory and cognitive deficits?  Early dementia -Long recent hospitalization, workup largely unremarkable -Ongoing problem, follow-up with [place] [facility]   CKD stage IIIb - Baseline creatinine of 1.6-1.8.  Creatinine now 1.9-2.2   Hypertension Hyperlipidemia - Continue hydralazine  metoprolol  and Lasix    Diabetes mellitus type 2 with hypoglycemia - A1c has been in the 8.7-9.1 range in the last 1 to 2 years -Resumed home regimen of insulin , needs to follow a carb modified diet, avoiding refined flour, processed food, sugar   Paroxysmal A-fib - Currently rate controlled.  Continue Eliquis  and metoprolol    History of complete AV block status post pacemaker   Obesity class I - Outpatient follow-up  Consultations: Cardiology  Discharge Exam: Vitals:   12/29/23 0929 12/29/23 1125  BP: 116/73 127/74  Pulse: 86 90  Resp: (!) 35 20  Temp:  97.9 F (36.6 C)  SpO2: 90% 94%   Gen: Awake, Alert, Oriented X 2,  cognitive deficits noted HEENT: no JVD Lungs: Good air movement bilaterally, CTAB CVS: S1S2/RRR Abd: soft, Non tender, non distended, BS present Extremities: No edema Skin: no new rashes on exposed skin   Discharge Instructions    Allergies as of 12/29/2023       Reactions   Oysters [shellfish Allergy] Hives, Swelling   Citrus Hives   Pecan Extract Hives  Cabbage Other (See Comments)   Other Other (See Comments)   walnuts   Apple Juice Other (See Comments)   Sore throat   Farxiga  [dapagliflozin ] Other (See Comments)   Low blood sugar        Medication List     STOP  taking these medications    amLODipine  10 MG tablet Commonly known as: NORVASC    metoprolol  tartrate 25 MG tablet Commonly known as: LOPRESSOR        TAKE these medications    acetaminophen  325 MG tablet Commonly known as: TYLENOL  Take 650 mg by mouth every 8 (eight) hours as needed for moderate pain (pain score 4-6).   allopurinol  100 MG tablet Commonly known as: ZYLOPRIM  TAKE 1 TABLET(100 MG) BY MOUTH DAILY   apixaban  2.5 MG Tabs tablet Commonly known as: Eliquis  Take 1 tablet (2.5 mg total) by mouth 2 (two) times daily.   calcitRIOL  0.25 MCG capsule Commonly known as: ROCALTROL  TAKE 1 CAPSULE BY MOUTH DAILY RETURN IN ABOUT 6 MONTHS(AROUND 02/04/2023)   cyanocobalamin  1000 MCG tablet Take 1 tablet (1,000 mcg total) by mouth daily.   Dexcom G7 Sensor Misc 1 Act by Does not apply route daily.   feeding supplement (GLUCERNA SHAKE) Liqd Take 237 mLs by mouth daily. What changed: when to take this   ferrous sulfate  325 (65 FE) MG tablet Take 1 tablet (325 mg total) by mouth daily with breakfast.   folic acid  1 MG tablet Commonly known as: FOLVITE  Take 1 tablet (1 mg total) by mouth daily.   Gvoke HypoPen  2-Pack 1 MG/0.2ML Soaj Generic drug: Glucagon  Inject 1 Act into the skin daily as needed.   HumuLIN  70/30 KwikPen (70-30) 100 UNIT/ML KwikPen Generic drug: insulin  isophane & regular human KwikPen Inject 15 Units into the skin in the morning and at bedtime. 40 units in AM and 25 units in PM What changed:  how much to take additional instructions   hydrALAZINE  10 MG tablet Commonly known as: APRESOLINE  Take 1 tablet (10 mg total) by mouth 3 (three) times daily.   levocetirizine 5 MG tablet Commonly known as: XYZAL  Take 1 tablet (5 mg total) by mouth every evening.   loratadine  10 MG tablet Commonly known as: CLARITIN  Take 10 mg by mouth daily as needed for allergies.   metoprolol  succinate 25 MG 24 hr tablet Commonly known as: TOPROL -XL Take 1 tablet  (25 mg total) by mouth daily. Start taking on: December 30, 2023   tamsulosin  0.4 MG Caps capsule Commonly known as: FLOMAX  Take 1 capsule (0.4 mg total) by mouth daily.   torsemide  20 MG tablet Commonly known as: DEMADEX  Take 1 tablet (20 mg total) by mouth daily. Give extra dose for weight gain of 3lb in 1 day or 5lb in 1 week Start taking on: December 30, 2023   Vitamin D3 250 MCG (10000 UT) capsule Take 10,000 Units by mouth daily.       Allergies  Allergen Reactions   Oysters [Shellfish Allergy] Hives and Swelling   Citrus Hives   Pecan Extract Hives   Cabbage Other (See Comments)   Other Other (See Comments)    walnuts   Apple Juice Other (See Comments)    Sore throat   Farxiga  [Dapagliflozin ] Other (See Comments)    Low blood sugar      The results of significant diagnostics from this hospitalization (including imaging, microbiology, ancillary and laboratory) are listed below for reference.    Significant Diagnostic Studies: CARDIAC CATHETERIZATION Result Date: 12/29/2023  Findings: RA = 9 RV = 41/11 PA = 43/28 (32) PCW = 16 Fick cardiac output/index = 7.4/3.6 Thermo CO/CI = 5.5/2.7 PVR = 2.2 Fick 2.9 TD Ao sat = 91% PA sat = 65%,  70% High SVC sat = 68% PAPi = 1.7 Assessment: 1. Normal left-sided pressures and cardiac output 2. Mildly elevated R-sided pressures with decreased PAPi suggestive of predominant R-sided failure 3. No evidence of intracardiac shunting Plan/Discussion: Medical therapy. Toribio Fuel, MD 9:36 AM  ECHOCARDIOGRAM COMPLETE Result Date: 12/27/2023    ECHOCARDIOGRAM REPORT   Patient Name:   Connor Dixon Date of Exam: 12/27/2023 Medical Rec #:  [id number]      Height:       69.5 in Accession #:    [id number]     Weight:       201.1 lb Date of Birth:  1934/10/03       BSA:          2.081 m Patient Age:    88 years       BP:           124/76 mmHg Patient Gender: M              HR:           81 bpm. Exam Location:  Inpatient Procedure: 2D Echo, Color  Doppler, Cardiac Doppler and Intracardiac            Opacification Agent (Both Spectral and Color Flow Doppler were            utilized during procedure). Indications:    I50.31 Acute diastolic (congestive) heart failure  History:        Patient has prior history of Echocardiogram examinations, most                 recent 10/11/2023. Pacemaker, Arrythmias:Atrial Fibrillation;                 Risk Factors:Hypertension, Diabetes and Dyslipidemia.  Sonographer:    Damien Senior RDCS Referring Phys: SOPHIE MAO IMPRESSIONS  1. Left ventricular ejection fraction, by estimation, is 25 to 30%. The left ventricle has severely decreased function. The left ventricle demonstrates global hypokinesis. Left ventricular diastolic parameters are indeterminate.  2. Right ventricular systolic function is mildly reduced. The right ventricular size is normal. There is normal pulmonary artery systolic pressure. The estimated right ventricular systolic pressure is 33.9 mmHg.  3. Left atrial size was mild to moderately dilated.  4. Right atrial size was mildly dilated.  5. The mitral valve is degenerative. Mild mitral valve regurgitation. Moderate mitral annular calcification.  6. The aortic valve is tricuspid. There is moderate calcification of the aortic valve. Aortic valve regurgitation is not visualized. Aortic valve sclerosis/calcification is present, without any evidence of aortic stenosis.  7. The inferior vena cava is normal in size with <50% respiratory variability, suggesting right atrial pressure of 8 mmHg. FINDINGS  Left Ventricle: Left ventricular ejection fraction, by estimation, is 25 to 30%. The left ventricle has severely decreased function. The left ventricle demonstrates global hypokinesis. Definity  contrast agent was given IV to delineate the left ventricular endocardial borders. The left ventricular internal cavity size was normal in size. There is no left ventricular hypertrophy. Left ventricular diastolic parameters  are indeterminate. Right Ventricle: The right ventricular size is normal. No increase in right ventricular wall thickness. Right ventricular systolic function is mildly reduced. There is normal pulmonary artery systolic pressure. The tricuspid regurgitant velocity is 2.69 m/s, and  with an assumed right atrial pressure of 5 mmHg, the estimated right ventricular systolic pressure is 33.9 mmHg. Left Atrium: Left atrial size was mild to moderately dilated. Right Atrium: Right atrial size was mildly dilated. Pericardium: There is no evidence of pericardial effusion. Mitral Valve: The mitral valve is degenerative in appearance. Moderate mitral annular calcification. Mild mitral valve regurgitation. Tricuspid Valve: The tricuspid valve is normal in structure. Tricuspid valve regurgitation is mild. Aortic Valve: The aortic valve is tricuspid. There is moderate calcification of the aortic valve. Aortic valve regurgitation is not visualized. Aortic valve sclerosis/calcification is present, without any evidence of aortic stenosis. Pulmonic Valve: The pulmonic valve was grossly normal. Pulmonic valve regurgitation is not visualized. Aorta: The aortic root and ascending aorta are structurally normal, with no evidence of dilitation. Venous: The inferior vena cava is normal in size with less than 50% respiratory variability, suggesting right atrial pressure of 8 mmHg. IAS/Shunts: The atrial septum is grossly normal. Additional Comments: A device lead is visualized in the right atrium and right ventricle.  LEFT VENTRICLE PLAX 2D LVIDd:         4.75 cm LVIDs:         4.00 cm LV PW:         0.90 cm LV IVS:        0.85 cm LVOT diam:     2.20 cm LV SV:         57 LV SV Index:   27 LVOT Area:     3.80 cm  LV Volumes (MOD) LV vol d, MOD A2C: 139.0 ml LV vol d, MOD A4C: 112.0 ml LV vol s, MOD A2C: 101.0 ml LV vol s, MOD A4C: 94.9 ml LV SV MOD A2C:     38.0 ml LV SV MOD A4C:     112.0 ml LV SV MOD BP:      28.4 ml RIGHT VENTRICLE RV S  prime:     8.16 cm/s TAPSE (M-mode): 1.8 cm LEFT ATRIUM             Index        RIGHT ATRIUM           Index LA diam:        4.10 cm 1.97 cm/m   RA Area:     22.80 cm LA Vol (A2C):   78.1 ml 37.53 ml/m  RA Volume:   66.30 ml  31.86 ml/m LA Vol (A4C):   74.3 ml 35.71 ml/m LA Biplane Vol: 78.2 ml 37.58 ml/m  AORTIC VALVE LVOT Vmax:   64.70 cm/s LVOT Vmean:  44.100 cm/s LVOT VTI:    0.149 m  AORTA Ao Root diam: 2.80 cm Ao Asc diam:  3.10 cm TRICUSPID VALVE TR Peak grad:   28.9 mmHg TR Vmax:        269.00 cm/s  SHUNTS Systemic VTI:  0.15 m Systemic Diam: 2.20 cm Toribio Fuel MD Electronically signed by Toribio Fuel MD Signature Date/Time: 12/27/2023/2:53:03 PM    Final    DG Wrist Complete Left Result Date: 12/26/2023 CLINICAL DATA:  89 year old male status post fall with wrist pain. EXAM: LEFT WRIST - COMPLETE 3+ VIEW COMPARISON:  None Available. FINDINGS: Four views 0804 hours. Calcified peripheral vascular disease. Distal radius and ulna appear intact. Radiocarpal joint space loss at the scaphoid, radial styloid. Subchondral sclerosis there. Mild widening of the scapholunate interval. Carpal bone alignment otherwise maintained. Proximal metacarpals intact. Less pronounced degeneration at the 1st [facility] joint. No acute fracture  or dislocation identified. IMPRESSION: 1. No acute fracture or dislocation identified about the left wrist. But mild widening of the scapholunate interval, can be posttraumatic and predispose to SLAC wrist. 2. Radiocarpal and 1st CMC joint degeneration. 3. Calcified peripheral vascular disease. Electronically Signed   By: VEAR Hurst M.D.   On: 12/26/2023 09:00   DG Chest Port 1 View Result Date: 12/26/2023 CLINICAL DATA:  89 year old male status post unwitnessed fall. On blood thinners. EXAM: PORTABLE CHEST 1 VIEW COMPARISON:  Cervical spine CT today. Portable chest 10/31/2023 and earlier. FINDINGS: Portable AP semi upright view at 0708 hours. Veiling bilateral lung opacity is new  or increased since June. Stable lung volumes. Stable cardiac size and mediastinal contours. Stable left chest dual lead cardiac pacemaker. No pneumothorax. No air bronchograms. Dense retrocardiac opacity. Pulmonary vascularity is indistinct. Paucity of bowel gas in the visible abdomen. No acute osseous abnormality identified. IMPRESSION: Bilateral pleural effusions, new or increased since June. Confluent bibasilar atelectasis suspected. Cannot exclude underlying interstitial edema. Electronically Signed   By: VEAR Hurst M.D.   On: 12/26/2023 08:04   CT MAXILLOFACIAL WO CONTRAST Result Date: 12/26/2023 CLINICAL DATA:  89 year old male status post unwitnessed fall. On blood thinners. EXAM: CT MAXILLOFACIAL WITHOUT CONTRAST TECHNIQUE: Multidetector CT imaging of the maxillofacial structures was performed. Multiplanar CT image reconstructions were also generated. RADIATION DOSE REDUCTION: This exam was performed according to the departmental dose-optimization program which includes automated exposure control, adjustment of the mA and/or kV according to patient size and/or use of iterative reconstruction technique. COMPARISON:  Head and cervical spine CT today. FINDINGS: Osseous: Chronic TMJ degeneration, but mandible appears intact and aligned. Chronic dental caries. Bilateral maxilla, zygoma, pterygoid and nasal bones appear intact. Visible skull base appears intact. Orbits: Right orbit not completely included, but visible on head CT today reported separately. Left orbital walls intact. Postoperative changes to both globes which appear intact. Intraorbital soft tissues appear symmetric and normal. Left premalar, inferior preseptal space soft tissue swelling and stranding redemonstrated. No soft tissue gas. Sinuses: Well aerated bilaterally. Soft tissues: Mild motion artifact of the larynx and pharynx. Negative visible noncontrast parapharyngeal spaces, retropharyngeal space, sublingual space, submandibular spaces,  masticator and parotid spaces. Limited intracranial: Reported separately. IMPRESSION: 1. Left premalar, inferior preseptal soft tissue injury with hematoma. 2. No facial fracture identified. Electronically Signed   By: VEAR Hurst M.D.   On: 12/26/2023 08:02   CT CERVICAL SPINE WO CONTRAST Result Date: 12/26/2023 CLINICAL DATA:  89 year old male status post unwitnessed fall. On blood thinners. EXAM: CT CERVICAL SPINE WITHOUT CONTRAST TECHNIQUE: Multidetector CT imaging of the cervical spine was performed without intravenous contrast. Multiplanar CT image reconstructions were also generated. RADIATION DOSE REDUCTION: This exam was performed according to the departmental dose-optimization program which includes automated exposure control, adjustment of the mA and/or kV according to patient size and/or use of iterative reconstruction technique. COMPARISON:  CT head and face today.  CTA neck 10/10/2023. FINDINGS: Alignment: Stable straightening of cervical lordosis. Mild dextroconvex rather than levoconvex cervical spine curvature now. Mild chronic and degenerative appearing cervical spine spondylolisthesis. Cervicothoracic junction alignment is within normal limits. Bilateral posterior element alignment is within normal limits. Skull base and vertebrae: Bone mineralization is within normal limits for age. Visualized skull base is intact. No atlanto-occipital dissociation. C1 and C2 appear chronically degenerated, but intact and aligned. No acute osseous abnormality identified. Soft tissues and spinal canal: No prevertebral fluid or swelling. No visible canal hematoma. Negative visible noncontrast neck soft tissues. Disc  levels: Chronic cervical spine degeneration appears stable from the May CTA. Upper chest: Large pleural effusions visible in both lung apices, simple fluid density. Visible upper thoracic levels appear grossly stable and intact. IMPRESSION: 1. No acute traumatic injury identified in the cervical spine.  Stable chronic cervical spine degeneration. 2. Large pleural effusions visible in both lung apices. Electronically Signed   By: VEAR Hurst M.D.   On: 12/26/2023 07:59   CT HEAD WO CONTRAST Result Date: 12/26/2023 CLINICAL DATA:  89 year old male status post unwitnessed fall. On blood thinners. EXAM: CT HEAD WITHOUT CONTRAST TECHNIQUE: Contiguous axial images were obtained from the base of the skull through the vertex without intravenous contrast. RADIATION DOSE REDUCTION: This exam was performed according to the departmental dose-optimization program which includes automated exposure control, adjustment of the mA and/or kV according to patient size and/or use of iterative reconstruction technique. COMPARISON:  Brain MRI 10/17/2023 head CT 10/11/2023. FINDINGS: Brain: Stable cerebral volume. No midline shift, ventriculomegaly, mass effect, evidence of mass lesion, intracranial hemorrhage or evidence of cortically based acute infarction. Confluent bilateral periventricular white matter hypodensity, chronic lacunar infarcts in the right basal ganglia. Stable gray-white matter differentiation throughout the brain. Vascular: Calcified atherosclerosis at the skull base. No suspicious intracranial vascular hyperdensity. Skull: Stable.  No acute fracture identified. Sinuses/Orbits: Visualized paranasal sinuses and mastoids are stable and well aerated. Other: Left infraorbital, premalar soft tissue swelling and stranding in keeping with superficial hematoma/contusion series 4, image 11. Underlying left maxilla and zygoma appear stable and intact. Other orbit and scalp soft tissues appear stable. IMPRESSION: 1. Left infraorbital/premalar soft tissue hematoma. No underlying fracture is identified. 2. No acute intracranial abnormality. Stable CT appearance of chronic small vessel disease. Electronically Signed   By: VEAR Hurst M.D.   On: 12/26/2023 07:56    Microbiology: Recent Results (from the past 240 hours)  Blood culture  (routine x 2)     Status: None (Preliminary result)   Collection Time: 12/26/23  8:46 AM   Specimen: BLOOD RIGHT HAND  Result Value Ref Range Status   Specimen Description BLOOD RIGHT HAND  Final   Special Requests   Final    BOTTLES DRAWN AEROBIC AND ANAEROBIC Blood Culture results may not be optimal due to an inadequate volume of blood received in culture bottles   Culture   Final    NO GROWTH 3 DAYS Performed at [facility], [address], [place], [place] [address]    Report Status PENDING  Incomplete  Blood culture (routine x 2)     Status: None (Preliminary result)   Collection Time: 12/26/23  8:46 AM   Specimen: BLOOD RIGHT HAND  Result Value Ref Range Status   Specimen Description BLOOD RIGHT HAND  Final   Special Requests   Final    BOTTLES DRAWN AEROBIC ONLY Blood Culture adequate volume   Culture   Final    NO GROWTH 3 DAYS Performed at [facility], [address], [place], [place] [address]    Report Status PENDING  Incomplete  Surgical PCR screen     Status: Abnormal   Collection Time: 12/29/23  4:28 AM   Specimen: Nasal Mucosa; Nasal Swab  Result Value Ref Range Status   MRSA, PCR POSITIVE (A) NEGATIVE Final    Comment: RESULT CALLED TO, READ BACK BY AND VERIFIED WITH: RN TIE PATAKY [id number] FCP    Staphylococcus aureus POSITIVE (A) NEGATIVE Final    Comment: (NOTE) The Xpert SA  Assay (FDA approved for NASAL specimens in patients 37 years of age and older), is one component of a comprehensive surveillance program. It is not intended to diagnose infection nor to guide or monitor treatment. Performed at [facility], [address] [place], [place] [address]      Labs: Basic Metabolic Panel: Recent Labs  Lab 12/26/23 0656 12/26/23 0659 12/27/23 0235 12/28/23 0239 12/29/23 0247  NA 137 137 139 136 135  K 4.7 4.6 4.3 4.2 4.2  CL 106 106 104 102 99  CO2  --  23 25 24 23   GLUCOSE 55* 57* 186* 233* 191*  BUN 32* 25* 24* 29* 31*   CREATININE 2.00* 1.90* 1.90* 2.02* 2.26*  CALCIUM  --  8.9 8.9 8.5* 8.3*  MG  --   --  2.0  --   --    Liver Function Tests: Recent Labs  Lab 12/26/23 0659 12/27/23 0235  AST 25 26  ALT 12 9  ALKPHOS 77 71  BILITOT 1.0 1.4*  PROT 6.4* 5.6*  ALBUMIN 2.7* 2.4*   No results for input(s): LIPASE, AMYLASE in the last 168 hours. No results for input(s): AMMONIA in the last 168 hours. CBC: Recent Labs  Lab 12/26/23 0656 12/26/23 0659 12/27/23 0235  WBC  --  6.2 5.7  HGB 13.3 12.8* 11.9*  HCT 39.0 39.2 36.9*  MCV  --  92.2 92.0  PLT  --  234 231   Cardiac Enzymes: No results for input(s): CKTOTAL, CKMB, CKMBINDEX, TROPONINI in the last 168 hours. BNP: BNP (last 3 results) Recent Labs    12/26/23 0659  BNP 248.8*    ProBNP (last 3 results) No results for input(s): PROBNP in the last 8760 hours.  CBG: Recent Labs  Lab 12/28/23 1156 12/28/23 1551 12/28/23 2056 12/29/23 0557 12/29/23 1128  GLUCAP 322* 241* 89 262* 221*       Signed:  Sigurd Pac MD.  [facility] 12/29/2023, 2:13 PM

## 2023-12-29 NOTE — TOC Transition Note (Signed)
 Transition of Care [facility]) - Discharge Note   Patient Details  Name: Connor Dixon MRN: [id number] Date of Birth: 10-16-1934  Transition of Care [facility]) CM/SW Contact:  Luise JAYSON Pan, LCSWA Phone Number: 12/29/2023, 3:41 PM   Clinical Narrative:   Patient will DC to: [facility]  Anticipated DC date: 12/29/23  Family notified: [name] (son) Transport by: ROME   Per MD patient ready for DC to [facility]. RN to call report prior to discharge [phone]; ask for med tech). RN, patient, patient's family, and facility notified of DC. Discharge Summary and FL2 sent to facility. DC packet on chart. Ambulance transport requested for patient 3:42 PM.   CSW will sign off for now as social work intervention is no longer needed. Please consult us  again if new needs arise.      Final next level of care: Assisted Living Barriers to Discharge: Barriers Resolved   Patient Goals and CMS Choice Patient states their goals for this hospitalization and ongoing recovery are:: Go back to ALF          Discharge Placement              Patient chooses bed at: Other - please specify in the comment section below: ([facility]) Patient to be transferred to facility by: [facility] Name of family member notified: [name] (son) Patient and family notified of of transfer: 12/29/23  Discharge Plan and Services Additional resources added to the After Visit Summary for   In-house Referral: Clinical Social Work Discharge Planning Services: CM Consult                                 Social Drivers of Health (SDOH) Interventions SDOH Screenings   Food Insecurity: Patient Unable To Answer (12/26/2023)  Housing: Unknown (12/26/2023)  Transportation Needs: Patient Unable To Answer (12/26/2023)  Utilities: Patient Unable To Answer (12/26/2023)  Alcohol  Screen: Low Risk  (04/06/2023)  Depression (PHQ2-9): Low Risk  (09/06/2023)  Financial Resource Strain: Low Risk  (04/06/2023)   Physical Activity: Inactive (04/06/2023)  Social Connections: Unknown (12/26/2023)  Stress: No Stress Concern Present (04/06/2023)  Tobacco Use: Low Risk  (12/26/2023)  Health Literacy: Adequate Health Literacy (04/06/2023)     Readmission Risk Interventions     No data to display

## 2023-12-29 NOTE — Progress Notes (Signed)
 PT Cancellation Note  Patient Details Name: KEIR VIERNES MRN: [id number] DOB: 07-31-1934   Cancelled Treatment:    Reason Eval/Treat Not Completed: (P) Patient at procedure or test/unavailable. RN reporting transport is currently on their way to take pt down for a heart cath procedure. Will plan to follow-up later as time permits.   Theo Ferretti, PT, DPT Acute Rehabilitation Services  Office: [phone]    Theo CHRISTELLA Ferretti 12/29/2023, 8:43 AM

## 2023-12-29 NOTE — TOC Progression Note (Signed)
 Transition of Care [facility]) - Progression Note    Patient Details  Name: Connor Dixon MRN: [id number] Date of Birth: 1935/03/17  Transition [facility] Care [facility]) CM/SW Contact  Luise JAYSON Pan, [place] Phone Number: 12/29/2023, 11:39AM  Clinical Narrative:   [name] called CSW to discuss patient discharging back to ALF. CSW informed [name] that per MD, awaiting cardiology to see patient and sign off.   CSW called Therisa with admissions to inquire about patient discharging over the weekend if need be. Per Therisa, she will be on call and can be reached at [phone]. Facility will need DC summary, FL2, and HHPT/OT order emailed to her at [email or web address].  CSW will continue to follow.    Expected Discharge Plan: Assisted Living Barriers to Discharge: Continued Medical Work up               Expected Discharge Plan and Services In-house Referral: Clinical Social Work Discharge Planning Services: CM Consult   Living arrangements for the past 2 months: Assisted Living Facility                                       Social Drivers of Health (SDOH) Interventions SDOH Screenings   Food Insecurity: Patient Unable To Answer (12/26/2023)  Housing: Unknown (12/26/2023)  Transportation Needs: Patient Unable To Answer (12/26/2023)  Utilities: Patient Unable To Answer (12/26/2023)  Alcohol  Screen: Low Risk  (04/06/2023)  Depression (PHQ2-9): Low Risk  (09/06/2023)  Financial Resource Strain: Low Risk  (04/06/2023)  Physical Activity: Inactive (04/06/2023)  Social Connections: Unknown (12/26/2023)  Stress: No Stress Concern Present (04/06/2023)  Tobacco Use: Low Risk  (12/26/2023)  Health Literacy: Adequate Health Literacy (04/06/2023)    Readmission Risk Interventions     No data to display

## 2023-12-31 ENCOUNTER — Encounter (HOSPITAL_COMMUNITY): Payer: Self-pay | Admitting: Internal Medicine

## 2023-12-31 ENCOUNTER — Other Ambulatory Visit: Payer: Self-pay | Admitting: Internal Medicine

## 2023-12-31 DIAGNOSIS — I10 Essential (primary) hypertension: Secondary | ICD-10-CM

## 2023-12-31 LAB — CULTURE, BLOOD (ROUTINE X 2)
Culture: NO GROWTH
Culture: NO GROWTH
Special Requests: ADEQUATE

## 2024-01-02 DIAGNOSIS — E1122 Type 2 diabetes mellitus with diabetic chronic kidney disease: Secondary | ICD-10-CM | POA: Diagnosis not present

## 2024-01-02 DIAGNOSIS — N183 Chronic kidney disease, stage 3 unspecified: Secondary | ICD-10-CM | POA: Diagnosis not present

## 2024-01-02 DIAGNOSIS — Z7901 Long term (current) use of anticoagulants: Secondary | ICD-10-CM | POA: Diagnosis not present

## 2024-01-02 DIAGNOSIS — I130 Hypertensive heart and chronic kidney disease with heart failure and stage 1 through stage 4 chronic kidney disease, or unspecified chronic kidney disease: Secondary | ICD-10-CM | POA: Diagnosis not present

## 2024-01-02 DIAGNOSIS — I5023 Acute on chronic systolic (congestive) heart failure: Secondary | ICD-10-CM | POA: Diagnosis not present

## 2024-01-02 DIAGNOSIS — Z794 Long term (current) use of insulin: Secondary | ICD-10-CM | POA: Diagnosis not present

## 2024-01-02 DIAGNOSIS — E785 Hyperlipidemia, unspecified: Secondary | ICD-10-CM | POA: Diagnosis not present

## 2024-01-02 DIAGNOSIS — I4891 Unspecified atrial fibrillation: Secondary | ICD-10-CM | POA: Diagnosis not present

## 2024-01-02 DIAGNOSIS — L89322 Pressure ulcer of left buttock, stage 2: Secondary | ICD-10-CM | POA: Diagnosis not present

## 2024-01-04 DIAGNOSIS — M2012 Hallux valgus (acquired), left foot: Secondary | ICD-10-CM | POA: Diagnosis not present

## 2024-01-04 DIAGNOSIS — M79675 Pain in left toe(s): Secondary | ICD-10-CM | POA: Diagnosis not present

## 2024-01-04 DIAGNOSIS — N1832 Chronic kidney disease, stage 3b: Secondary | ICD-10-CM | POA: Diagnosis not present

## 2024-01-04 DIAGNOSIS — B351 Tinea unguium: Secondary | ICD-10-CM | POA: Diagnosis not present

## 2024-01-08 ENCOUNTER — Encounter (HOSPITAL_COMMUNITY)

## 2024-01-08 DIAGNOSIS — R262 Difficulty in walking, not elsewhere classified: Secondary | ICD-10-CM | POA: Diagnosis not present

## 2024-01-08 DIAGNOSIS — R278 Other lack of coordination: Secondary | ICD-10-CM | POA: Diagnosis not present

## 2024-01-08 DIAGNOSIS — R2681 Unsteadiness on feet: Secondary | ICD-10-CM | POA: Diagnosis not present

## 2024-01-10 DIAGNOSIS — M109 Gout, unspecified: Secondary | ICD-10-CM | POA: Diagnosis not present

## 2024-01-10 DIAGNOSIS — R278 Other lack of coordination: Secondary | ICD-10-CM | POA: Diagnosis not present

## 2024-01-10 DIAGNOSIS — R296 Repeated falls: Secondary | ICD-10-CM | POA: Diagnosis not present

## 2024-01-10 DIAGNOSIS — M6259 Muscle wasting and atrophy, not elsewhere classified, multiple sites: Secondary | ICD-10-CM | POA: Diagnosis not present

## 2024-01-10 DIAGNOSIS — M62511 Muscle wasting and atrophy, not elsewhere classified, right shoulder: Secondary | ICD-10-CM | POA: Diagnosis not present

## 2024-01-10 DIAGNOSIS — G9341 Metabolic encephalopathy: Secondary | ICD-10-CM | POA: Diagnosis not present

## 2024-01-10 DIAGNOSIS — M62512 Muscle wasting and atrophy, not elsewhere classified, left shoulder: Secondary | ICD-10-CM | POA: Diagnosis not present

## 2024-01-10 DIAGNOSIS — R262 Difficulty in walking, not elsewhere classified: Secondary | ICD-10-CM | POA: Diagnosis not present

## 2024-01-10 DIAGNOSIS — M6281 Muscle weakness (generalized): Secondary | ICD-10-CM | POA: Diagnosis not present

## 2024-01-11 ENCOUNTER — Ambulatory Visit: Admitting: Physician Assistant

## 2024-01-11 DIAGNOSIS — Z794 Long term (current) use of insulin: Secondary | ICD-10-CM | POA: Diagnosis not present

## 2024-01-11 DIAGNOSIS — E1122 Type 2 diabetes mellitus with diabetic chronic kidney disease: Secondary | ICD-10-CM | POA: Diagnosis not present

## 2024-01-11 DIAGNOSIS — G3184 Mild cognitive impairment, so stated: Secondary | ICD-10-CM | POA: Diagnosis not present

## 2024-01-11 DIAGNOSIS — N1832 Chronic kidney disease, stage 3b: Secondary | ICD-10-CM | POA: Diagnosis not present

## 2024-01-11 DIAGNOSIS — I509 Heart failure, unspecified: Secondary | ICD-10-CM | POA: Diagnosis not present

## 2024-01-11 DIAGNOSIS — Z13228 Encounter for screening for other metabolic disorders: Secondary | ICD-10-CM | POA: Diagnosis not present

## 2024-01-11 DIAGNOSIS — I10 Essential (primary) hypertension: Secondary | ICD-10-CM | POA: Diagnosis not present

## 2024-01-13 DIAGNOSIS — E785 Hyperlipidemia, unspecified: Secondary | ICD-10-CM | POA: Diagnosis not present

## 2024-01-13 DIAGNOSIS — M6281 Muscle weakness (generalized): Secondary | ICD-10-CM | POA: Diagnosis not present

## 2024-01-13 DIAGNOSIS — G9341 Metabolic encephalopathy: Secondary | ICD-10-CM | POA: Diagnosis not present

## 2024-01-13 DIAGNOSIS — R1312 Dysphagia, oropharyngeal phase: Secondary | ICD-10-CM | POA: Diagnosis not present

## 2024-01-13 DIAGNOSIS — R2689 Other abnormalities of gait and mobility: Secondary | ICD-10-CM | POA: Diagnosis not present

## 2024-01-13 DIAGNOSIS — I259 Chronic ischemic heart disease, unspecified: Secondary | ICD-10-CM | POA: Diagnosis not present

## 2024-01-13 DIAGNOSIS — E119 Type 2 diabetes mellitus without complications: Secondary | ICD-10-CM | POA: Diagnosis not present

## 2024-01-13 DIAGNOSIS — R41841 Cognitive communication deficit: Secondary | ICD-10-CM | POA: Diagnosis not present

## 2024-01-13 DIAGNOSIS — M109 Gout, unspecified: Secondary | ICD-10-CM | POA: Diagnosis not present

## 2024-01-13 DIAGNOSIS — Z741 Need for assistance with personal care: Secondary | ICD-10-CM | POA: Diagnosis not present

## 2024-01-13 DIAGNOSIS — M6259 Muscle wasting and atrophy, not elsewhere classified, multiple sites: Secondary | ICD-10-CM | POA: Diagnosis not present

## 2024-01-16 DIAGNOSIS — M62512 Muscle wasting and atrophy, not elsewhere classified, left shoulder: Secondary | ICD-10-CM | POA: Diagnosis not present

## 2024-01-16 DIAGNOSIS — R296 Repeated falls: Secondary | ICD-10-CM | POA: Diagnosis not present

## 2024-01-16 DIAGNOSIS — R278 Other lack of coordination: Secondary | ICD-10-CM | POA: Diagnosis not present

## 2024-01-16 DIAGNOSIS — R41841 Cognitive communication deficit: Secondary | ICD-10-CM | POA: Diagnosis not present

## 2024-01-16 DIAGNOSIS — R1312 Dysphagia, oropharyngeal phase: Secondary | ICD-10-CM | POA: Diagnosis not present

## 2024-01-16 DIAGNOSIS — G9341 Metabolic encephalopathy: Secondary | ICD-10-CM | POA: Diagnosis not present

## 2024-01-16 DIAGNOSIS — M62511 Muscle wasting and atrophy, not elsewhere classified, right shoulder: Secondary | ICD-10-CM | POA: Diagnosis not present

## 2024-01-16 NOTE — Progress Notes (Unsigned)
 Cardiology Office Note   Date:  01/18/2024  ID:  Connor Dixon, DOB 01-21-1935, MRN [id number] PCP: [name], MD  [facility] Providers Cardiologist:  Wilbert Bihari, MD Electrophysiologist:  Danelle Birmingham, MD    History of Present Illness Connor Dixon is a 89 y.o. male with a past medical history of hypertension, complete heart block status post PPM, PAF, diabetes, chronic HFpEF, hyperlipidemia, hypertension, CAD stage IIIb, suspected cognitive impairment who is being seen 12/26/2023 for evaluation of acute on chronic HFpEF at the request of Dr. Cheryle.  The patient had a prolonged hospital stay earlier in May and June due to encephalopathy and was sent to rehab only on as needed Lasix  and not received any doses of Lasix  at the rehab center per patient's son.  He was admitted with increasing shortness of breath and extremity edema after mechanical fall.  Chest x-ray consistent with bilateral pleural effusions which are new or increased since May.  BNP was elevated at 48.  CT of the neck reported large pleural effusions at the apices.  Found to have acute on chronic stage IIIb CKD with serum creatinine of 2 (baseline 1.6-1.8) likely related to acute CHF exacerbation.  Was placed on 40 of Lasix  IV twice daily had good urine output and weight was down by 4 pounds since admission.  Serum creatinine remained about the same.  Right heart cath was arranged.  Today, he presents with biventricular heart failure and atrial fibrillation who presents for follow-up after hospitalization for heart failure exacerbation.  He experienced right-sided heart failure symptoms, including leg swelling and shortness of breath, leading to recent hospitalization.  A right heart catheterization was performed, and he was treated with Lasix . Discontinuation of the diuretic at a rehab facility led to symptom recurrence and a fall, resulting in head and arm injuries.  He had been off his diuretics for at least 2  months.  Low blood oxygen levels were noted in the emergency room.  An echocardiogram showed reduced ejection fraction of 25-30%, mild mitral valve regurgitation, and biatrial dilation. Atrial fibrillation may contribute to the atrial dilation. He has a pacemaker for AV heart block, with ventricular pacing noted. Moderate aortic valve calcification was observed without stenosis.  No recent episodes of chest pain or palpitations. Weight monitoring is inconsistent, with recent weights around 197 pounds, raising concerns about fluid retention.We emphasizes daily weight monitoring to prevent further hospitalizations.  Reports no shortness of breath nor dyspnea on exertion. Reports no chest pain, pressure, or tightness. No edema, orthopnea, PND. Reports no palpitations.   Discussed the use of AI scribe software for clinical note transcription with the patient, who gave verbal consent to proceed.  ROS: pertinent ROS in HPI  Studies Reviewed    Echocardiogram 12/27/2023   IMPRESSIONS     1. Left ventricular ejection fraction, by estimation, is 25 to 30%. The  left ventricle has severely decreased function. The left ventricle  demonstrates global hypokinesis. Left ventricular diastolic parameters are  indeterminate.   2. Right ventricular systolic function is mildly reduced. The right  ventricular size is normal. There is normal pulmonary artery systolic  pressure. The estimated right ventricular systolic pressure is 33.9 mmHg.   3. Left atrial size was mild to moderately dilated.   4. Right atrial size was mildly dilated.   5. The mitral valve is degenerative. Mild mitral valve regurgitation.  Moderate mitral annular calcification.   6. The aortic valve is tricuspid. There is moderate calcification of  the  aortic valve. Aortic valve regurgitation is not visualized. Aortic valve  sclerosis/calcification is present, without any evidence of aortic  stenosis.   7. The inferior vena cava is  normal in size with <50% respiratory  variability, suggesting right atrial pressure of 8 mmHg.    RHC 12/29/23  Findings:   RA = 9 RV = 41/11 PA = 43/28 (32) PCW = 16 Fick cardiac output/index = 7.4/3.6 Thermo CO/CI = 5.5/2.7 PVR = 2.2 Fick 2.9 TD Ao sat = 91% PA sat = 65%,  70% High SVC sat = 68% PAPi = 1.7   Assessment: 1. Normal left-sided pressures and cardiac output 2. Mildly elevated R-sided pressures with decreased PAPi suggestive of predominant R-sided failure 3. No evidence of intracardiac shunting   Plan/Discussion:    Medical therapy. Connor Fuel, MD  9:36 AM   Risk Assessment/Calculations  CHA2DS2-VASc Score = 5   This indicates a 7.2% annual risk of stroke. The patient's score is based upon: CHF History: 1 HTN History: 1 Diabetes History: 1 Stroke History: 0 Vascular Disease History: 0 Age Score: 2 Gender Score: 0    Physical Exam VS:  BP 110/76   Pulse 91   Ht 5' 8 (1.727 m)   Wt 174 lb 9.6 oz (79.2 kg)   SpO2 94%   BMI 26.55 kg/m        Wt Readings from Last 3 Encounters:  01/18/24 174 lb 9.6 oz (79.2 kg)  12/29/23 198 lb 3.1 oz (89.9 kg)  11/22/23 200 lb (90.7 kg)    GEN: Well nourished, well developed in no acute distress NECK: No JVD; No carotid bruits CARDIAC: RRR, no murmurs, rubs, gallops RESPIRATORY:  Clear to auscultation without rales, wheezing or rhonchi  ABDOMEN: Soft, non-tender, non-distended EXTREMITIES:  No edema; No deformity   ASSESSMENT AND PLAN  Acute on chronic combined systolic/diastolic CHF Acute on chronic systolic and diastolic heart failure Recent exacerbation due to discontinued diuretics. Right heart catheterization showed reduced ejection fraction (25-30%). Weight monitoring essential to prevent fluid overload. - Continue current dose of diuretics. -daily weights and low sodium diet - Instruct facility to perform daily weight checks and report weight gain of 2-3 pounds overnight or 5 pounds in a  week. -Lexiscan is not an option per family - Order echocardiogram for early December to reassess heart function.  Paroxysmal atrial fibrillation with biatrial enlargement Biatrial enlargement likely related to atrial fibrillation. No immediate intervention required but monitoring necessary for potential arrhythmias. - Discussed potential need for invasive procedures like ablation if arrhythmias worsen. -continue low dose Eliquis   Complete atrioventricular block, status post ventricular pacemaker Ventricular pacemaker in place since 2011 for complete AV block. Current EKG shows V-paced rhythm, indicating proper function. - Ensure follow-up with Dr. Waddell for pacemaker management.  Follow-Up Follow-up with heart failure clinic recommended due to significant decrease in ejection fraction. Coordination needed for out-of-state family. - Schedule follow-up with heart failure clinic - Coordinate echocardiogram and other necessary appointments for early December      Dispo: Will recommend follow-up with Dr. Waddell and CHF clinic.  You may see Dr. Shlomo in 6 months  Signed, Connor LOISE Fabry, PA-C

## 2024-01-17 ENCOUNTER — Encounter (HOSPITAL_COMMUNITY)

## 2024-01-17 DIAGNOSIS — R2681 Unsteadiness on feet: Secondary | ICD-10-CM | POA: Diagnosis not present

## 2024-01-17 DIAGNOSIS — R278 Other lack of coordination: Secondary | ICD-10-CM | POA: Diagnosis not present

## 2024-01-18 ENCOUNTER — Ambulatory Visit: Admitting: Physician Assistant

## 2024-01-18 ENCOUNTER — Ambulatory Visit: Attending: Physician Assistant | Admitting: Physician Assistant

## 2024-01-18 ENCOUNTER — Encounter: Payer: Self-pay | Admitting: Physician Assistant

## 2024-01-18 VITALS — BP 110/76 | HR 91 | Ht 68.0 in | Wt 174.6 lb

## 2024-01-18 DIAGNOSIS — N179 Acute kidney failure, unspecified: Secondary | ICD-10-CM

## 2024-01-18 DIAGNOSIS — I442 Atrioventricular block, complete: Secondary | ICD-10-CM | POA: Diagnosis not present

## 2024-01-18 DIAGNOSIS — I10 Essential (primary) hypertension: Secondary | ICD-10-CM

## 2024-01-18 DIAGNOSIS — I5033 Acute on chronic diastolic (congestive) heart failure: Secondary | ICD-10-CM | POA: Diagnosis not present

## 2024-01-18 DIAGNOSIS — I5082 Biventricular heart failure: Secondary | ICD-10-CM | POA: Diagnosis not present

## 2024-01-18 DIAGNOSIS — I480 Paroxysmal atrial fibrillation: Secondary | ICD-10-CM | POA: Diagnosis not present

## 2024-01-18 DIAGNOSIS — I5020 Unspecified systolic (congestive) heart failure: Secondary | ICD-10-CM

## 2024-01-18 DIAGNOSIS — Z950 Presence of cardiac pacemaker: Secondary | ICD-10-CM | POA: Diagnosis not present

## 2024-01-18 NOTE — Patient Instructions (Addendum)
 Medication Instructions:  Your physician recommends that you continue on your current medications as directed. Please refer to the Current Medication list given to you today.  *If you need a refill on your cardiac medications before your next appointment, please call your pharmacy*   Testing/Procedures: Your physician has requested that you have an echocardiogram in 3 months( Early December). Echocardiography is a painless test that uses sound waves to create images of your heart. It provides your doctor with information about the size and shape of your heart and how well your heart's chambers and valves are working. This procedure takes approximately one hour. There are no restrictions for this procedure. Please do NOT wear cologne, perfume, aftershave, or lotions (deodorant is allowed). Please arrive 15 minutes prior to your appointment time.  Please note: We ask at that you not bring children with you during ultrasound (echo/ vascular) testing. Due to room size and safety concerns, children are not allowed in the ultrasound rooms during exams. Our front office staff cannot provide observation of children in our lobby area while testing is being conducted. An adult accompanying a patient to their appointment will only be allowed in the ultrasound room at the discretion of the ultrasound technician under special circumstances. We apologize for any inconvenience.   Follow-Up: Please call Heart Failure Clinic to schedule your follow up appointment at [phone]  First available with Dr Danelle Birmingham  Your next appointment:   6 month(s)  Provider:   Wilbert Bihari, MD    We recommend signing up for the patient portal called MyChart.  Sign up information is provided on this After Visit Summary.  MyChart is used to connect with patients for Virtual Visits (Telemedicine).  Patients are able to view lab/test results, encounter notes, upcoming appointments, etc.  Non-urgent messages can be sent to  your provider as well.   To learn more about what you can do with MyChart, go to [email or web address].   Other Instructions Please make sure to weigh daily and if your weight increases 2-3 lbs or more overnight or 5 pounds in one week call our office to let us  know [phone].

## 2024-01-19 DIAGNOSIS — R278 Other lack of coordination: Secondary | ICD-10-CM | POA: Diagnosis not present

## 2024-01-19 DIAGNOSIS — M62511 Muscle wasting and atrophy, not elsewhere classified, right shoulder: Secondary | ICD-10-CM | POA: Diagnosis not present

## 2024-01-19 DIAGNOSIS — M62512 Muscle wasting and atrophy, not elsewhere classified, left shoulder: Secondary | ICD-10-CM | POA: Diagnosis not present

## 2024-01-19 DIAGNOSIS — R296 Repeated falls: Secondary | ICD-10-CM | POA: Diagnosis not present

## 2024-01-20 DIAGNOSIS — R2681 Unsteadiness on feet: Secondary | ICD-10-CM | POA: Diagnosis not present

## 2024-01-20 DIAGNOSIS — R278 Other lack of coordination: Secondary | ICD-10-CM | POA: Diagnosis not present

## 2024-01-22 DIAGNOSIS — R41841 Cognitive communication deficit: Secondary | ICD-10-CM | POA: Diagnosis not present

## 2024-01-22 DIAGNOSIS — G9341 Metabolic encephalopathy: Secondary | ICD-10-CM | POA: Diagnosis not present

## 2024-01-22 DIAGNOSIS — R1312 Dysphagia, oropharyngeal phase: Secondary | ICD-10-CM | POA: Diagnosis not present

## 2024-01-23 DIAGNOSIS — N1832 Chronic kidney disease, stage 3b: Secondary | ICD-10-CM | POA: Diagnosis not present

## 2024-01-23 DIAGNOSIS — M62512 Muscle wasting and atrophy, not elsewhere classified, left shoulder: Secondary | ICD-10-CM | POA: Diagnosis not present

## 2024-01-23 DIAGNOSIS — R296 Repeated falls: Secondary | ICD-10-CM | POA: Diagnosis not present

## 2024-01-23 DIAGNOSIS — R319 Hematuria, unspecified: Secondary | ICD-10-CM | POA: Diagnosis not present

## 2024-01-23 DIAGNOSIS — Z794 Long term (current) use of insulin: Secondary | ICD-10-CM | POA: Diagnosis not present

## 2024-01-23 DIAGNOSIS — E1122 Type 2 diabetes mellitus with diabetic chronic kidney disease: Secondary | ICD-10-CM | POA: Diagnosis not present

## 2024-01-23 DIAGNOSIS — M62511 Muscle wasting and atrophy, not elsewhere classified, right shoulder: Secondary | ICD-10-CM | POA: Diagnosis not present

## 2024-01-23 DIAGNOSIS — I509 Heart failure, unspecified: Secondary | ICD-10-CM | POA: Diagnosis not present

## 2024-01-23 DIAGNOSIS — R278 Other lack of coordination: Secondary | ICD-10-CM | POA: Diagnosis not present

## 2024-01-24 DIAGNOSIS — R8290 Unspecified abnormal findings in urine: Secondary | ICD-10-CM | POA: Diagnosis not present

## 2024-01-24 DIAGNOSIS — R296 Repeated falls: Secondary | ICD-10-CM | POA: Diagnosis not present

## 2024-01-24 DIAGNOSIS — M62512 Muscle wasting and atrophy, not elsewhere classified, left shoulder: Secondary | ICD-10-CM | POA: Diagnosis not present

## 2024-01-24 DIAGNOSIS — M62511 Muscle wasting and atrophy, not elsewhere classified, right shoulder: Secondary | ICD-10-CM | POA: Diagnosis not present

## 2024-01-24 DIAGNOSIS — R278 Other lack of coordination: Secondary | ICD-10-CM | POA: Diagnosis not present

## 2024-01-24 DIAGNOSIS — R2681 Unsteadiness on feet: Secondary | ICD-10-CM | POA: Diagnosis not present

## 2024-01-25 ENCOUNTER — Emergency Department (HOSPITAL_COMMUNITY)

## 2024-01-25 ENCOUNTER — Other Ambulatory Visit: Payer: Self-pay

## 2024-01-25 ENCOUNTER — Inpatient Hospital Stay (HOSPITAL_COMMUNITY)
Admission: EM | Admit: 2024-01-25 | Discharge: 2024-01-27 | DRG: 682 | Disposition: A | Discharge status: Home Health | Source: Skilled Nursing Facility | Attending: Family Medicine | Admitting: Family Medicine

## 2024-01-25 DIAGNOSIS — I5022 Chronic systolic (congestive) heart failure: Secondary | ICD-10-CM | POA: Diagnosis present

## 2024-01-25 DIAGNOSIS — A419 Sepsis, unspecified organism: Secondary | ICD-10-CM

## 2024-01-25 DIAGNOSIS — Z823 Family history of stroke: Secondary | ICD-10-CM

## 2024-01-25 DIAGNOSIS — R451 Restlessness and agitation: Secondary | ICD-10-CM | POA: Diagnosis present

## 2024-01-25 DIAGNOSIS — Z950 Presence of cardiac pacemaker: Secondary | ICD-10-CM | POA: Diagnosis not present

## 2024-01-25 DIAGNOSIS — E119 Type 2 diabetes mellitus without complications: Secondary | ICD-10-CM | POA: Diagnosis not present

## 2024-01-25 DIAGNOSIS — R2681 Unsteadiness on feet: Secondary | ICD-10-CM | POA: Diagnosis not present

## 2024-01-25 DIAGNOSIS — R319 Hematuria, unspecified: Secondary | ICD-10-CM | POA: Diagnosis not present

## 2024-01-25 DIAGNOSIS — I442 Atrioventricular block, complete: Secondary | ICD-10-CM | POA: Diagnosis present

## 2024-01-25 DIAGNOSIS — G3184 Mild cognitive impairment, so stated: Secondary | ICD-10-CM | POA: Diagnosis present

## 2024-01-25 DIAGNOSIS — N1832 Chronic kidney disease, stage 3b: Secondary | ICD-10-CM | POA: Diagnosis present

## 2024-01-25 DIAGNOSIS — N400 Enlarged prostate without lower urinary tract symptoms: Secondary | ICD-10-CM | POA: Diagnosis not present

## 2024-01-25 DIAGNOSIS — R6520 Severe sepsis without septic shock: Secondary | ICD-10-CM | POA: Diagnosis not present

## 2024-01-25 DIAGNOSIS — Z8249 Family history of ischemic heart disease and other diseases of the circulatory system: Secondary | ICD-10-CM

## 2024-01-25 DIAGNOSIS — N3001 Acute cystitis with hematuria: Secondary | ICD-10-CM | POA: Diagnosis not present

## 2024-01-25 DIAGNOSIS — E1159 Type 2 diabetes mellitus with other circulatory complications: Secondary | ICD-10-CM | POA: Diagnosis present

## 2024-01-25 DIAGNOSIS — I213 ST elevation (STEMI) myocardial infarction of unspecified site: Secondary | ICD-10-CM | POA: Diagnosis not present

## 2024-01-25 DIAGNOSIS — D72829 Elevated white blood cell count, unspecified: Secondary | ICD-10-CM | POA: Diagnosis not present

## 2024-01-25 DIAGNOSIS — N179 Acute kidney failure, unspecified: Principal | ICD-10-CM | POA: Diagnosis present

## 2024-01-25 DIAGNOSIS — N401 Enlarged prostate with lower urinary tract symptoms: Secondary | ICD-10-CM | POA: Diagnosis present

## 2024-01-25 DIAGNOSIS — E1022 Type 1 diabetes mellitus with diabetic chronic kidney disease: Secondary | ICD-10-CM | POA: Diagnosis present

## 2024-01-25 DIAGNOSIS — G9340 Encephalopathy, unspecified: Secondary | ICD-10-CM | POA: Diagnosis present

## 2024-01-25 DIAGNOSIS — R278 Other lack of coordination: Secondary | ICD-10-CM | POA: Diagnosis not present

## 2024-01-25 DIAGNOSIS — E860 Dehydration: Secondary | ICD-10-CM | POA: Diagnosis not present

## 2024-01-25 DIAGNOSIS — N320 Bladder-neck obstruction: Secondary | ICD-10-CM | POA: Diagnosis not present

## 2024-01-25 DIAGNOSIS — J189 Pneumonia, unspecified organism: Secondary | ICD-10-CM

## 2024-01-25 DIAGNOSIS — E1065 Type 1 diabetes mellitus with hyperglycemia: Secondary | ICD-10-CM | POA: Diagnosis present

## 2024-01-25 DIAGNOSIS — N189 Chronic kidney disease, unspecified: Secondary | ICD-10-CM | POA: Diagnosis not present

## 2024-01-25 DIAGNOSIS — Z833 Family history of diabetes mellitus: Secondary | ICD-10-CM

## 2024-01-25 DIAGNOSIS — I480 Paroxysmal atrial fibrillation: Secondary | ICD-10-CM | POA: Diagnosis present

## 2024-01-25 DIAGNOSIS — R4182 Altered mental status, unspecified: Secondary | ICD-10-CM | POA: Diagnosis not present

## 2024-01-25 DIAGNOSIS — B964 Proteus (mirabilis) (morganii) as the cause of diseases classified elsewhere: Secondary | ICD-10-CM | POA: Diagnosis present

## 2024-01-25 DIAGNOSIS — Z96641 Presence of right artificial hip joint: Secondary | ICD-10-CM | POA: Diagnosis present

## 2024-01-25 DIAGNOSIS — I152 Hypertension secondary to endocrine disorders: Secondary | ICD-10-CM | POA: Diagnosis not present

## 2024-01-25 DIAGNOSIS — Z79899 Other long term (current) drug therapy: Secondary | ICD-10-CM | POA: Diagnosis not present

## 2024-01-25 DIAGNOSIS — I5042 Chronic combined systolic (congestive) and diastolic (congestive) heart failure: Secondary | ICD-10-CM | POA: Diagnosis present

## 2024-01-25 DIAGNOSIS — Z7901 Long term (current) use of anticoagulants: Secondary | ICD-10-CM | POA: Diagnosis not present

## 2024-01-25 DIAGNOSIS — R310 Gross hematuria: Secondary | ICD-10-CM

## 2024-01-25 DIAGNOSIS — Z888 Allergy status to other drugs, medicaments and biological substances status: Secondary | ICD-10-CM

## 2024-01-25 DIAGNOSIS — E785 Hyperlipidemia, unspecified: Secondary | ICD-10-CM | POA: Diagnosis present

## 2024-01-25 DIAGNOSIS — G9341 Metabolic encephalopathy: Secondary | ICD-10-CM | POA: Diagnosis present

## 2024-01-25 DIAGNOSIS — E1059 Type 1 diabetes mellitus with other circulatory complications: Secondary | ICD-10-CM | POA: Diagnosis present

## 2024-01-25 DIAGNOSIS — Z1152 Encounter for screening for COVID-19: Secondary | ICD-10-CM | POA: Diagnosis not present

## 2024-01-25 DIAGNOSIS — I517 Cardiomegaly: Secondary | ICD-10-CM | POA: Diagnosis not present

## 2024-01-25 DIAGNOSIS — Z83438 Family history of other disorder of lipoprotein metabolism and other lipidemia: Secondary | ICD-10-CM

## 2024-01-25 DIAGNOSIS — Z825 Family history of asthma and other chronic lower respiratory diseases: Secondary | ICD-10-CM

## 2024-01-25 DIAGNOSIS — Z794 Long term (current) use of insulin: Secondary | ICD-10-CM

## 2024-01-25 DIAGNOSIS — R531 Weakness: Secondary | ICD-10-CM | POA: Diagnosis not present

## 2024-01-25 LAB — CBC WITH DIFFERENTIAL/PLATELET
Abs Immature Granulocytes: 0.04 K/uL (ref 0.00–0.07)
Basophils Absolute: 0 K/uL (ref 0.0–0.1)
Basophils Relative: 0 %
Eosinophils Absolute: 0.1 K/uL (ref 0.0–0.5)
Eosinophils Relative: 1 %
HCT: 45 % (ref 39.0–52.0)
Hemoglobin: 14.2 g/dL (ref 13.0–17.0)
Immature Granulocytes: 0 %
Lymphocytes Relative: 15 %
Lymphs Abs: 1.7 K/uL (ref 0.7–4.0)
MCH: 29 pg (ref 26.0–34.0)
MCHC: 31.6 g/dL (ref 30.0–36.0)
MCV: 92 fL (ref 80.0–100.0)
Monocytes Absolute: 0.9 K/uL (ref 0.1–1.0)
Monocytes Relative: 8 %
Neutro Abs: 8.5 K/uL — ABNORMAL HIGH (ref 1.7–7.7)
Neutrophils Relative %: 76 %
Platelets: 231 K/uL (ref 150–400)
RBC: 4.89 MIL/uL (ref 4.22–5.81)
RDW: 15.1 % (ref 11.5–15.5)
WBC: 11.3 K/uL — ABNORMAL HIGH (ref 4.0–10.5)
nRBC: 0 % (ref 0.0–0.2)

## 2024-01-25 LAB — BLOOD GAS, VENOUS
Acid-Base Excess: 6.4 mmol/L — ABNORMAL HIGH (ref 0.0–2.0)
Bicarbonate: 32.4 mmol/L — ABNORMAL HIGH (ref 20.0–28.0)
O2 Saturation: 53.6 %
Patient temperature: 37
pCO2, Ven: 50 mmHg (ref 44–60)
pH, Ven: 7.42 (ref 7.25–7.43)
pO2, Ven: 31 mmHg — CL (ref 32–45)

## 2024-01-25 LAB — I-STAT CG4 LACTIC ACID, ED
Lactic Acid, Venous: 3.2 mmol/L (ref 0.5–1.9)
Lactic Acid, Venous: 3.4 mmol/L (ref 0.5–1.9)

## 2024-01-25 LAB — COMPREHENSIVE METABOLIC PANEL WITH GFR
ALT: 10 U/L (ref 0–44)
AST: 26 U/L (ref 15–41)
Albumin: 3.6 g/dL (ref 3.5–5.0)
Alkaline Phosphatase: 115 U/L (ref 38–126)
Anion gap: 16 — ABNORMAL HIGH (ref 5–15)
BUN: 47 mg/dL — ABNORMAL HIGH (ref 8–23)
CO2: 26 mmol/L (ref 22–32)
Calcium: 9.8 mg/dL (ref 8.9–10.3)
Chloride: 94 mmol/L — ABNORMAL LOW (ref 98–111)
Creatinine, Ser: 3.08 mg/dL — ABNORMAL HIGH (ref 0.61–1.24)
GFR, Estimated: 19 mL/min — ABNORMAL LOW (ref >60)
Glucose, Bld: 488 mg/dL — ABNORMAL HIGH (ref 70–99)
Potassium: 3.8 mmol/L (ref 3.5–5.1)
Sodium: 135 mmol/L (ref 135–145)
Total Bilirubin: 0.7 mg/dL (ref 0.0–1.2)
Total Protein: 7.2 g/dL (ref 6.5–8.1)

## 2024-01-25 LAB — URINALYSIS, ROUTINE W REFLEX MICROSCOPIC
Bilirubin Urine: NEGATIVE
Glucose, UA: >=500 mg/dL — AB
Ketones, ur: NEGATIVE mg/dL
Nitrite: NEGATIVE
Protein, ur: 100 mg/dL — AB
RBC / HPF: >50 RBC/hpf (ref 0–5)
Specific Gravity, Urine: 1.008 (ref 1.005–1.030)
pH: 5 (ref 5.0–8.0)

## 2024-01-25 LAB — RESP PANEL BY RT-PCR (RSV, FLU A&B, COVID)  RVPGX2
Influenza A by PCR: NEGATIVE
Influenza B by PCR: NEGATIVE
Resp Syncytial Virus by PCR: NEGATIVE
SARS Coronavirus 2 by RT PCR: NEGATIVE

## 2024-01-25 LAB — PROTIME-INR
INR: 1 (ref 0.8–1.2)
Prothrombin Time: 14 s (ref 11.4–15.2)

## 2024-01-25 LAB — CBG MONITORING, ED
Glucose-Capillary: 498 mg/dL — ABNORMAL HIGH (ref 70–99)
Glucose-Capillary: 332 mg/dL — ABNORMAL HIGH (ref 70–99)

## 2024-01-25 LAB — GLUCOSE, CAPILLARY: Glucose-Capillary: 364 mg/dL — ABNORMAL HIGH (ref 70–99)

## 2024-01-25 LAB — TROPONIN T, HIGH SENSITIVITY
Troponin T High Sensitivity: 60 ng/L — ABNORMAL HIGH (ref 0–19)
Troponin T High Sensitivity: 66 ng/L — ABNORMAL HIGH (ref 0–19)

## 2024-01-25 LAB — LACTIC ACID, PLASMA: Lactic Acid, Venous: 2.8 mmol/L (ref 0.5–1.9)

## 2024-01-25 LAB — SODIUM, URINE, RANDOM: Sodium, Ur: 62 mmol/L

## 2024-01-25 LAB — CREATININE, URINE, RANDOM: Creatinine, Urine: 27 mg/dL

## 2024-01-25 LAB — BETA-HYDROXYBUTYRIC ACID: Beta-Hydroxybutyric Acid: 0.09 mmol/L (ref 0.05–0.27)

## 2024-01-25 MED ORDER — INSULIN ASPART 100 UNIT/ML IJ SOLN
0.0000 [IU] | Freq: Every day | INTRAMUSCULAR | Status: DC
  Administered 2024-01-25 – 2024-01-26 (×2): 5 [IU] via SUBCUTANEOUS
  Filled 2024-01-25: qty 0.05

## 2024-01-25 MED ORDER — BISACODYL 5 MG PO TBEC: 5.0000 mg | DELAYED_RELEASE_TABLET | Freq: Every day | ORAL | Status: DC | PRN

## 2024-01-25 MED ORDER — PROCHLORPERAZINE EDISYLATE 10 MG/2ML IJ SOLN: 10.0000 mg | Freq: Four times a day (QID) | INTRAMUSCULAR | Status: DC | PRN

## 2024-01-25 MED ORDER — LACTATED RINGERS IV SOLN: INTRAVENOUS | Status: DC

## 2024-01-25 MED ORDER — INSULIN ASPART 100 UNIT/ML IJ SOLN
8.0000 [IU] | Freq: Once | INTRAMUSCULAR | Status: AC
  Administered 2024-01-25: 8 [IU] via INTRAVENOUS
  Filled 2024-01-25: qty 0.08

## 2024-01-25 MED ORDER — SODIUM CHLORIDE 0.9 % IV BOLUS
1000.0000 mL | Freq: Once | INTRAVENOUS | Status: AC
  Administered 2024-01-25: 1000 mL via INTRAVENOUS

## 2024-01-25 MED ORDER — INSULIN GLARGINE-YFGN 100 UNIT/ML ~~LOC~~ SOLN: 10.0000 [IU] | Freq: Every day | SUBCUTANEOUS | Status: DC

## 2024-01-25 MED ORDER — METOPROLOL SUCCINATE ER 25 MG PO TB24
25.0000 mg | ORAL_TABLET | Freq: Every day | ORAL | Status: DC
  Administered 2024-01-26 – 2024-01-27 (×2): 25 mg via ORAL
  Filled 2024-01-25 (×2): qty 1

## 2024-01-25 MED ORDER — HYDRALAZINE HCL 10 MG PO TABS
10.0000 mg | ORAL_TABLET | Freq: Three times a day (TID) | ORAL | Status: DC
  Administered 2024-01-25 – 2024-01-27 (×6): 10 mg via ORAL
  Filled 2024-01-25 (×6): qty 1

## 2024-01-25 MED ORDER — TAMSULOSIN HCL 0.4 MG PO CAPS
0.4000 mg | ORAL_CAPSULE | Freq: Every day | ORAL | Status: DC
  Administered 2024-01-26 – 2024-01-27 (×2): 0.4 mg via ORAL
  Filled 2024-01-25 (×2): qty 1

## 2024-01-25 MED ORDER — VANCOMYCIN HCL IN DEXTROSE 1-5 GM/200ML-% IV SOLN
1000.0000 mg | Freq: Once | INTRAVENOUS | Status: AC
  Administered 2024-01-25: 1000 mg via INTRAVENOUS
  Filled 2024-01-25: qty 200

## 2024-01-25 MED ORDER — SODIUM CHLORIDE 0.9 % IV SOLN
2.0000 g | Freq: Once | INTRAVENOUS | Status: AC
  Administered 2024-01-25: 2 g via INTRAVENOUS
  Filled 2024-01-25: qty 12.5

## 2024-01-25 MED ORDER — INSULIN GLARGINE 100 UNIT/ML ~~LOC~~ SOLN
10.0000 [IU] | Freq: Every day | SUBCUTANEOUS | Status: DC
  Administered 2024-01-25: 10 [IU] via SUBCUTANEOUS
  Filled 2024-01-25: qty 0.1

## 2024-01-25 MED ORDER — INSULIN ASPART 100 UNIT/ML IJ SOLN
0.0000 [IU] | Freq: Three times a day (TID) | INTRAMUSCULAR | Status: DC
  Filled 2024-01-25: qty 0.09

## 2024-01-25 MED ORDER — APIXABAN 2.5 MG PO TABS
2.5000 mg | ORAL_TABLET | Freq: Two times a day (BID) | ORAL | Status: DC
  Administered 2024-01-26 – 2024-01-27 (×3): 2.5 mg via ORAL
  Filled 2024-01-25 (×3): qty 1

## 2024-01-25 MED ORDER — METRONIDAZOLE 500 MG/100ML IV SOLN
500.0000 mg | Freq: Once | INTRAVENOUS | Status: AC
  Administered 2024-01-25: 500 mg via INTRAVENOUS
  Filled 2024-01-25: qty 100

## 2024-01-25 MED ORDER — ACETAMINOPHEN 325 MG PO TABS
650.0000 mg | ORAL_TABLET | Freq: Four times a day (QID) | ORAL | Status: DC | PRN
  Administered 2024-01-26: 650 mg via ORAL
  Filled 2024-01-25: qty 2

## 2024-01-25 MED ORDER — SODIUM CHLORIDE 0.9 % IV SOLN
2.0000 g | INTRAVENOUS | Status: DC
  Administered 2024-01-26: 2 g via INTRAVENOUS
  Filled 2024-01-25: qty 20

## 2024-01-25 MED ORDER — ACETAMINOPHEN 650 MG RE SUPP: 650.0000 mg | Freq: Four times a day (QID) | RECTAL | Status: DC | PRN

## 2024-01-25 MED ORDER — SENNOSIDES-DOCUSATE SODIUM 8.6-50 MG PO TABS: 1.0000 | ORAL_TABLET | Freq: Every evening | ORAL | Status: DC | PRN

## 2024-01-25 MED ORDER — SODIUM CHLORIDE 0.9% FLUSH
3.0000 mL | Freq: Two times a day (BID) | INTRAVENOUS | Status: DC
Start: 2024-01-25 — End: 2024-01-27
  Administered 2024-01-25 – 2024-01-26 (×3): 3 mL via INTRAVENOUS

## 2024-01-25 NOTE — ED Notes (Signed)
 RN and Dr were informed about pt's CBG being 498.

## 2024-01-25 NOTE — ED Notes (Signed)
 Patient transported to CT

## 2024-01-25 NOTE — ED Triage Notes (Signed)
 BIBA from [facility] for hematuria, AMS- new agitation per staff. Pt is oriented to self only- hx of dementia. 126/74 bp 98 hr 100 r/a 410 cbg

## 2024-01-25 NOTE — Hospital Course (Signed)
 Connor Dixon is a 89 y.o. male with medical history significant for chronic HFrEF (EF 30-35%), PAF on Eliquis , CHB s/p PPM, CKD stage IIIb, insulin -dependent T2DM, HTN, HLD, mild cognitive impairment who is admitted with acute encephalopathy and AKI likely secondary to acute UTI/cystitis.

## 2024-01-25 NOTE — ED Notes (Signed)
 Inc care, full linen change, and boosted up in bed.

## 2024-01-25 NOTE — Sepsis Progress Note (Signed)
 eLink is following this Code Stroke.

## 2024-01-25 NOTE — Sepsis Progress Note (Signed)
 Notified provider of need to order another repeat lactic acid as the second one came back higher than the first.

## 2024-01-25 NOTE — H&P (Signed)
 History and Physical    Connor Dixon FMW:[id number] DOB: [birth year] DOA: 01/25/2024  PCP: [name], MD  Patient coming from: [facility]  I have personally briefly reviewed patient's old medical records in [facility]  Chief Complaint: Hematuria, altered mental status, agitation  HPI: Connor Dixon is a 89 y.o. male with medical history significant for chronic HFrEF (EF 25-30%), PAF on Eliquis , CHB s/p PPM, CKD stage IIIb, insulin -dependent T2DM, HTN, HLD, mild cognitive impairment who presented to the ED from ALF for evaluation of hematuria, altered mental status, and agitation.  History is limited from patient due to encephalopathy and is otherwise supplemented by EDP, chart review, goddaughter at bedside, and son by phone.  Called daughter states that patient was fully independent and living alone at home until about 3.5 months ago when she found him at home unresponsive.  He had a prolonged admission to the hospital at that time with extensive workup which was unrevealing for an acute process.  Since then patient has had decreased functional status and some speech changes.  He has been reciting at an assisted living facility.  He was last admitted 8/12-8/15 for acute on chronic CHF exacerbation.  He was diuresed with IV Lasix  with improvement.  He underwent RHC which noted normal left-sided filling pressures and cardiac output, mildly elevated right-sided pressures.  He was discharged on torsemide  20 mg daily.  Today patient was noted to have new agitation and increased confusion from his baseline.  Patient was complaining about pain with urination and he was noted to have hematuria.  Patient was brought to the ED for further evaluation.  On arrival he was oriented to self only.  At time of admission patient is oriented to self, knows he is in the hospital, recognizes his goddaughter at bedside.  He has some difficulty communicating due to speech issues but seems to  acknowledge that he was having difficulty emptying his bladder.  He denies shortness of breath, cough, chest pain.  ED Course  Labs/Imaging on admission: I have personally reviewed following labs and imaging studies.  Initial vitals showed BP 147/95, pulse 100, RR 17, temp 97.8 F, SpO2 99% on room air.  Labs showed WBC 11.3, hemoglobin 14.2, platelets 231, sodium 135, potassium 3.8, bicarb 26, BUN 47, creatinine 3.08, serum glucose 488, LFTs within normal limits, troponin T 66, lactic acid 3.2 > 3.4.  SARS-CoV-2, influenza, RSV PCR negative.  Urinalysis showed negative nitrites, trace leukocytes, >50 RBCs, 11-20 WBCs, rare bacteria.  Urine culture in process.  Blood cultures ordered and pending.  CT chest/abdomen/pelvis without contrast showed focal faint ground glass subpleural density in the anterior left upper lobe.  No acute intra-abdominal or pelvic pathology.  Enlarged prostate gland with findings of chronic bladder outlet obstruction.  No bowel obstruction.  Normal-appearing appendix.  Patient was given 2 L normal saline, IV vancomycin , cefepime , and Flagyl , IV NovoLog  8 units.  Bladder scan showed 132 mLs.  The hospitalist service was consulted for admission.  Review of Systems: All systems reviewed and are negative except as documented in history of present illness above.   Past Medical History:  Diagnosis Date   Acute on chronic diastolic congestive heart failure (HCC)    Arthus phenomenon    Atrioventricular block, complete (HCC)    Cardiac pacemaker in situ 07/22/2010   Chronic kidney disease, stage 3 (HCC)    Presumably from longstanding DM & HTN. Scr has gradually increased over the past 2 years (1.6  in 12/2010, 2 in 03/2011, and up to 2.1 in 01/2013)   DIABETES MELLITUS, TYPE I, ADULT ONSET 08/06/2007   DM (diabetes mellitus) (HCC)    adult onset    Essential hypertension, benign 08/06/2007   Very good BP control   History of second degree heart block    HLD  (hyperlipidemia)    HYPERLIPIDEMIA 08/06/2007   Hypopotassemia    Morbid obesity (HCC)    Obesity    Other and unspecified angina pectoris    typical   Type I (juvenile type) diabetes mellitus without mention of complication, not stated as uncontrolled    (04/25/13 OV with Dr. Rayburn) = Poorly controlled & is working  w/Dr. Karlos to improve this. Most recent Hgb A1c was 8.2% but Connor Dixon reports it was down to 7.6 last month.    Past Surgical History:  Procedure Laterality Date   CATARACT EXTRACTION  8/08   [place]   PPM GENERATOR CHANGEOUT N/A 02/13/2019   Procedure: PPM GENERATOR CHANGEOUT;  Surgeon: Waddell Danelle ORN, MD;  Location: [facility] INVASIVE CV LAB;  Service: Cardiovascular;  Laterality: N/A;   PTVDP  12/11   PPM - [employer]   RIGHT HEART CATH N/A 12/29/2023   Procedure: RIGHT HEART CATH;  Surgeon: Cherrie Toribio SAUNDERS, MD;  Location: [facility] INVASIVE CV LAB;  Service: Cardiovascular;  Laterality: N/A;   TOTAL HIP ARTHROPLASTY  04/05/06   right    Social History: Social History   Tobacco Use   Smoking status: Never   Smokeless tobacco: Never  Vaping Use   Vaping status: Never Used  Substance Use Topics   Alcohol  use: No   Drug use: No   Allergies  Allergen Reactions   Oysters [Shellfish Allergy] Hives and Swelling   Citrus Hives   Pecan Extract Hives   Cabbage Other (See Comments)   Other Other (See Comments)    walnuts   Apple Juice Other (See Comments)    Sore throat   Farxiga  [Dapagliflozin ] Other (See Comments)    Low blood sugar    Family History  Problem Relation Age of Onset   Asthma Father    Coronary artery disease Father    Diabetes Father    Hyperlipidemia Father    Stroke Father 17   Coronary artery disease Mother    Asthma Mother    Heart attack Mother 41   Colon cancer Neg Hx        no definate prostate CA      Prior to Admission medications   Medication Sig Start Date End Date Taking? Authorizing Provider  acetaminophen   (TYLENOL ) 325 MG tablet Take 650 mg by mouth every 8 (eight) hours as needed for moderate pain (pain score 4-6).    [provider]  allopurinol  (ZYLOPRIM ) 100 MG tablet TAKE 1 TABLET(100 MG) BY MOUTH DAILY 09/25/23   [name], MD  apixaban  (ELIQUIS ) 2.5 MG TABS tablet Take 1 tablet (2.5 mg total) by mouth 2 (two) times daily. 08/14/23   [name], MD  calcitRIOL  (ROCALTROL ) 0.25 MCG capsule TAKE 1 CAPSULE BY MOUTH DAILY RETURN IN ABOUT 6 MONTHS(AROUND 02/04/2023) 08/14/23   [name], MD  Cholecalciferol (VITAMIN D3) 250 MCG (10000 UT) capsule Take 10,000 Units by mouth daily.    [provider]  Continuous Glucose Sensor (DEXCOM G7 SENSOR) MISC 1 Act by Does not apply route daily. 01/06/23   [name], MD  cyanocobalamin  1000 MCG tablet Take 1 tablet (1,000 mcg total)  by mouth daily. 11/08/23   Gherghe, Costin M, MD  feeding supplement, GLUCERNA SHAKE, (GLUCERNA SHAKE) LIQD Take 237 mLs by mouth daily. 11/07/23   Gherghe, Costin M, MD  ferrous sulfate  325 (65 FE) MG tablet Take 1 tablet (325 mg total) by mouth daily with breakfast. 11/08/23   Trixie Nilda HERO, MD  folic acid  (FOLVITE ) 1 MG tablet Take 1 tablet (1 mg total) by mouth daily. 11/08/23   Gherghe, Costin M, MD  Glucagon  (GVOKE HYPOPEN  2-PACK) 1 MG/0.2ML SOAJ Inject 1 Act into the skin daily as needed. 04/05/22   [name], MD  hydrALAZINE  (APRESOLINE ) 10 MG tablet Take 1 tablet (10 mg total) by mouth 3 (three) times daily. 12/29/23   Fairy Frames, MD  insulin  isophane & regular human KwikPen (HUMULIN  70/30 KWIKPEN) (70-30) 100 UNIT/ML KwikPen Inject 15 Units into the skin in the morning and at bedtime. 40 units in AM and 25 units in PM 11/08/23   Danford, Lonni SQUIBB, MD  levocetirizine (XYZAL ) 5 MG tablet Take 1 tablet (5 mg total) by mouth every evening. 09/06/23   [name], MD  loratadine  (CLARITIN ) 10 MG tablet Take 10 mg by mouth daily as needed for allergies.    [provider]  metoprolol  succinate (TOPROL -XL) 25 MG 24 hr tablet Take 1 tablet (25 mg total) by mouth daily. 12/30/23   Fairy Frames, MD  tamsulosin  (FLOMAX ) 0.4 MG CAPS capsule Take 1 capsule (0.4 mg total) by mouth daily. 11/08/23   Gherghe, Costin M, MD  torsemide  (DEMADEX ) 20 MG tablet Take 1 tablet (20 mg total) by mouth daily. Give extra dose for weight gain of 3lb in 1 day or 5lb in 1 week 12/30/23   Fairy Frames, MD    Physical Exam: Vitals:   01/25/24 1630 01/25/24 1645 01/25/24 1700 01/25/24 1730  BP: (!) 152/98   134/72  Pulse: (!) 110 (!) 113 (!) 112 70  Resp: (!) 21 15 15  (!) 21  Temp:      TempSrc:      SpO2: 98% 97% 97% 100%   Constitutional: Resting in bed, NAD, calm, comfortable Eyes: EOMI, lids and conjunctivae normal ENMT: Mucous membranes are moist. Posterior pharynx clear of any exudate or lesions.Normal dentition.  Neck: normal, supple, no masses. Respiratory: Faint bibasilar inspiratory crackles. Normal respiratory effort while on room air. No accessory muscle use.  Cardiovascular: Regular rate and rhythm, no murmurs / rubs / gallops. No extremity edema. 2+ pedal pulses.  PPM in place left upper chest. Abdomen: no tenderness, no masses palpated. Musculoskeletal: no clubbing / cyanosis. No joint deformity upper and lower extremities. Good ROM, no contractures. Normal muscle tone.  Skin: no rashes, lesions, ulcers. No induration Neurologic: Sensation intact. Strength 5/5 in all 4.  Psychiatric: Alert and oriented to self, knows he is in the hospital in [place], [place], recognizes goddaughter at bedside.  EKG: Personally reviewed. V paced rhythm with PVC, rate 98.  Similar to previous.  Assessment/Plan Principal Problem:   Acute cystitis with hematuria Active Problems:   Acute kidney injury superimposed on chronic kidney disease (HCC)   Acute encephalopathy   Chronic heart failure with reduced ejection fraction (HFrEF, <= 40%) (HCC)   Hypertension associated with  diabetes (HCC)   Presence of permanent cardiac pacemaker   Insulin -requiring or dependent type II diabetes mellitus (HCC)   Paroxysmal atrial fibrillation (HCC)   Connor Dixon is a 89 y.o. male with medical history significant for chronic HFrEF (EF 30-35%),  PAF on Eliquis , CHB s/p PPM, CKD stage IIIb, insulin -dependent T2DM, HTN, HLD, mild cognitive impairment who is admitted with acute encephalopathy and AKI likely secondary to acute UTI/cystitis.  Assessment and Plan: Hematuria suspected due to cystitis Prostatomegaly with chronic bladder outlet obstruction: Suspect presentation due to acute UTI/cystitis.  Patient did complain of pain on urination to staff at his facility prior to arrival.  Lactic acid elevated but blood pressure is stable. - Narrow antibiotics IV ceftriaxone  - Follow urine cultures - Monitor UOP, bladder scan and in and out cath as needed - Continue Flomax   Acute kidney injury superimposed on CKD stage IIIb: Creatinine 3.08 on admission compared to recent baseline 1.9-2.2.  Suspect secondary to above.  He does not have any peripheral edema. - S/p 2 L normal saline given in ED; hold further fluids - Holding home torsemide  for now - Monitor UOP and repeat labs in a.m.  Acute encephalopathy/agitation on background of mild cognitive impairment: This was likely secondary to cystitis.  Mentation appears to be closer to baseline at time of admission per goddaughter who is at bedside.  Continue management as above.  Delirium and fall precautions.  Left upper lobe density: CT showed a faint focal ground glass subpleural density in the anterior LUL.  Patient does not have any respiratory symptoms.  Suspect this is likely atelectasis rather than infectious process.  Chronic HFrEF: Last EF 25-30% by TTE 12/27/2023.  No evidence of volume overload at time of admission.  He has received 2 L IV fluids as above. - Holding further IV fluids tonight - Holding torsemide  for now -  Continue Toprol -XL 25 mg daily  Paroxysmal atrial fibrillation CHB s/p PPM: Remains in paced rhythm.  Continue Toprol -XL and resume Eliquis  in AM.  Insulin -dependent type 2 diabetes with hyperglycemia: Hyperglycemic on arrival without evidence of DKA/HHS.  Received IV NovoLog  8 units while in the ED with improving CBGs. - Placed on Semglee  10 units nightly and SSI, adjust as needed  Hypertension: Continue Toprol -XL and hydralazine .   DVT prophylaxis: apixaban  (ELIQUIS ) tablet 2.5 mg Start: 01/26/24 1000 apixaban  (ELIQUIS ) tablet 2.5 mg   Code Status: Full code, discussed with patient's son [name] by phone Family Communication: Called daughter at bedside, son [name] by phone Disposition Plan: From ALF, dispo pending clinical progress Consults called: None Severity of Illness: The appropriate patient status for this patient is OBSERVATION. Observation status is judged to be reasonable and necessary in order to provide the required intensity of service to ensure the patient's safety. The patient's presenting symptoms, physical exam findings, and initial radiographic and laboratory data in the context of their medical condition is felt to place them at decreased risk for further clinical deterioration. Furthermore, it is anticipated that the patient will be medically stable for discharge from the hospital within 2 midnights of admission.   Jorie Blanch MD [facility]  If 7PM-7AM, please contact night-coverage [email or web address]  01/25/2024, 7:41 PM

## 2024-01-25 NOTE — ED Provider Notes (Signed)
 Connor Dixon EMERGENCY DEPARTMENT AT [facility] ([facility]) Provider Note   CSN: [id number] Arrival date & time: 01/25/24  1420     Patient presents with: Altered Mental Status   Connor Dixon is a 89 y.o. male.   Pt is a 89 yo male with pmhx significant for HTN, HLD, DM, CKD, complete HB s/p pacemaker placement, and paroxysmal afib (on Eliquis ).  Pt presents to the ED today with agitation and hematuria.  Pt is not normally agitated per EMS.  Pt is unable to give me a hx.  He keeps requesting to see a [name] whom he believes is in the ED and does not want to talk to anyone but [name].         Prior to Admission medications   Medication Sig Start Date End Date Taking? Authorizing Provider  acetaminophen  (TYLENOL ) 325 MG tablet Take 650 mg by mouth every 8 (eight) hours as needed for moderate pain (pain score 4-6).    [provider]  allopurinol  (ZYLOPRIM ) 100 MG tablet TAKE 1 TABLET(100 MG) BY MOUTH DAILY 09/25/23   [name], MD  apixaban  (ELIQUIS ) 2.5 MG TABS tablet Take 1 tablet (2.5 mg total) by mouth 2 (two) times daily. 08/14/23   [name], MD  calcitRIOL  (ROCALTROL ) 0.25 MCG capsule TAKE 1 CAPSULE BY MOUTH DAILY RETURN IN ABOUT 6 MONTHS(AROUND 02/04/2023) 08/14/23   [name], MD  Cholecalciferol (VITAMIN D3) 250 MCG (10000 UT) capsule Take 10,000 Units by mouth daily.    [provider]  Continuous Glucose Sensor (DEXCOM G7 SENSOR) MISC 1 Act by Does not apply route daily. 01/06/23   [name], MD  cyanocobalamin  1000 MCG tablet Take 1 tablet (1,000 mcg total) by mouth daily. 11/08/23   Gherghe, Costin M, MD  feeding supplement, GLUCERNA SHAKE, (GLUCERNA SHAKE) LIQD Take 237 mLs by mouth daily. 11/07/23   Gherghe, Costin M, MD  ferrous sulfate  325 (65 FE) MG tablet Take 1 tablet (325 mg total) by mouth daily with breakfast. 11/08/23   Gherghe, Costin M, MD  folic acid  (FOLVITE ) 1 MG tablet Take 1 tablet (1 mg total) by mouth  daily. 11/08/23   Gherghe, Costin M, MD  Glucagon  (GVOKE HYPOPEN  2-PACK) 1 MG/0.2ML SOAJ Inject 1 Act into the skin daily as needed. 04/05/22   [name], MD  hydrALAZINE  (APRESOLINE ) 10 MG tablet Take 1 tablet (10 mg total) by mouth 3 (three) times daily. 12/29/23   Fairy Frames, MD  insulin  isophane & regular human KwikPen (HUMULIN  70/30 KWIKPEN) (70-30) 100 UNIT/ML KwikPen Inject 15 Units into the skin in the morning and at bedtime. 40 units in AM and 25 units in PM 11/08/23   Danford, Lonni SQUIBB, MD  levocetirizine (XYZAL ) 5 MG tablet Take 1 tablet (5 mg total) by mouth every evening. 09/06/23   [name], MD  loratadine  (CLARITIN ) 10 MG tablet Take 10 mg by mouth daily as needed for allergies.    [provider]  metoprolol  succinate (TOPROL -XL) 25 MG 24 hr tablet Take 1 tablet (25 mg total) by mouth daily. 12/30/23   Fairy Frames, MD  tamsulosin  (FLOMAX ) 0.4 MG CAPS capsule Take 1 capsule (0.4 mg total) by mouth daily. 11/08/23   Gherghe, Costin M, MD  torsemide  (DEMADEX ) 20 MG tablet Take 1 tablet (20 mg total) by mouth daily. Give extra dose for weight gain of 3lb in 1 day or 5lb in 1 week 12/30/23   Fairy Frames, MD  Allergies: Oysters [shellfish allergy], Citrus, Pecan extract, Cabbage, Other, Apple juice, and Farxiga  [dapagliflozin ]    Review of Systems  Genitourinary:  Positive for hematuria.  All other systems reviewed and are negative.   Updated Vital Signs BP 134/72   Pulse 70   Temp 99.2 F (37.3 C) (Rectal)   Resp (!) 21   SpO2 100%   Physical Exam Vitals and nursing note reviewed.  Constitutional:      Appearance: Normal appearance.  HENT:     Head: Normocephalic and atraumatic.     Right Ear: External ear normal.     Left Ear: External ear normal.     Nose: Nose normal.     Mouth/Throat:     Mouth: Mucous membranes are dry.  Eyes:     Extraocular Movements: Extraocular movements intact.     Conjunctiva/sclera: Conjunctivae normal.      Pupils: Pupils are equal, round, and reactive to light.  Cardiovascular:     Rate and Rhythm: Regular rhythm. Tachycardia present.     Pulses: Normal pulses.     Heart sounds: Normal heart sounds.  Pulmonary:     Effort: Pulmonary effort is normal.     Breath sounds: Normal breath sounds.  Abdominal:     General: Abdomen is flat. Bowel sounds are normal.     Palpations: Abdomen is soft.  Musculoskeletal:        General: Normal range of motion.     Cervical back: Normal range of motion and neck supple.  Skin:    General: Skin is warm.     Capillary Refill: Capillary refill takes less than 2 seconds.  Neurological:     Mental Status: He is alert. He is disoriented.     Comments: Pt is moving all 4 extremities  Psychiatric:        Behavior: Behavior is agitated.     (all labs ordered are listed, but only abnormal results are displayed) Labs Reviewed  CBC WITH DIFFERENTIAL/PLATELET - Abnormal; Notable for the following components:      Result Value   WBC 11.3 (*)    Neutro Abs 8.5 (*)    All other components within normal limits  COMPREHENSIVE METABOLIC PANEL WITH GFR - Abnormal; Notable for the following components:   Chloride 94 (*)    Glucose, Bld 488 (*)    BUN 47 (*)    Creatinine, Ser 3.08 (*)    GFR, Estimated 19 (*)    Anion gap 16 (*)    All other components within normal limits  URINALYSIS, ROUTINE W REFLEX MICROSCOPIC - Abnormal; Notable for the following components:   Color, Urine STRAW (*)    APPearance HAZY (*)    Glucose, UA >=500 (*)    Hgb urine dipstick LARGE (*)    Protein, ur 100 (*)    Leukocytes,Ua TRACE (*)    Bacteria, UA RARE (*)    All other components within normal limits  BLOOD GAS, VENOUS - Abnormal; Notable for the following components:   pO2, Ven 31 (*)    Bicarbonate 32.4 (*)    Acid-Base Excess 6.4 (*)    All other components within normal limits  CBG MONITORING, ED - Abnormal; Notable for the following components:    Glucose-Capillary 498 (*)    All other components within normal limits  I-STAT CG4 LACTIC ACID, ED - Abnormal; Notable for the following components:   Lactic Acid, Venous 3.2 (*)    All other components within normal limits  I-STAT CG4 LACTIC ACID, ED - Abnormal; Notable for the following components:   Lactic Acid, Venous 3.4 (*)    All other components within normal limits  CBG MONITORING, ED - Abnormal; Notable for the following components:   Glucose-Capillary 332 (*)    All other components within normal limits  TROPONIN T, HIGH SENSITIVITY - Abnormal; Notable for the following components:   Troponin T High Sensitivity 66 (*)    All other components within normal limits  TROPONIN T, HIGH SENSITIVITY - Abnormal; Notable for the following components:   Troponin T High Sensitivity 60 (*)    All other components within normal limits  RESP PANEL BY RT-PCR (RSV, FLU A&B, COVID)  RVPGX2  URINE CULTURE  CULTURE, BLOOD (ROUTINE X 2)  CULTURE, BLOOD (ROUTINE X 2)  PROTIME-INR  BETA-HYDROXYBUTYRIC ACID  PSA  LACTIC ACID, PLASMA  LACTIC ACID, PLASMA  I-STAT CG4 LACTIC ACID, ED    EKG: EKG Interpretation Date/Time:  Thursday January 25 2024 15:24:21 EDT Ventricular Rate:  98 PR Interval:    QRS Duration:  176 QT Interval:  463 QTC Calculation: 592 R Axis:   -81  Text Interpretation: VENTRICULAR PACED RHYTHM No significant change since last tracing Confirmed by Dean Clarity [phone]) on 01/25/2024 3:36:20 PM  Radiology: CT CHEST ABDOMEN PELVIS WO CONTRAST Result Date: 01/25/2024 CLINICAL DATA:  Sepsis. EXAM: CT CHEST, ABDOMEN AND PELVIS WITHOUT CONTRAST TECHNIQUE: Multidetector CT imaging of the chest, abdomen and pelvis was performed following the standard protocol without IV contrast. RADIATION DOSE REDUCTION: This exam was performed according to the departmental dose-optimization program which includes automated exposure control, adjustment of the mA and/or kV according to  patient size and/or use of iterative reconstruction technique. COMPARISON:  CT dated 10/14/2023. FINDINGS: Evaluation of this exam is limited in the absence of intravenous contrast as well as due to streak artifact caused by right hip arthroplasty. CT CHEST FINDINGS Cardiovascular: There is no cardiomegaly or pericardial effusion. There is 3 vessel coronary vascular calcification. Left pectoral pacemaker device. Mild atherosclerotic calcification of the thoracic aorta. No aneurysmal dilatation. The central pulmonary arteries are grossly unremarkable. Mediastinum/Nodes: No hilar or mediastinal adenopathy. The esophagus is grossly unremarkable. No mediastinal fluid collection. Lungs/Pleura: Focal faint ground-glass subpleural density in the anterior left upper lobe (34/6) new since the prior CT and may represent an area of atelectasis or infiltrate. No consolidative changes. There is no pleural effusion pneumothorax. The central airways are patent. Musculoskeletal: No acute osseous pathology. Indeterminate 1.5 x 3.0 cm nodular density in the subcutaneous soft tissues of the left breast (30/2) was present on the prior CT. This is not characterized but favor to represent an area of fat contusion. Correlation with clinical exam and history of trauma recommended. Other etiologies are not excluded. Ultrasound may provide better evaluation. CT ABDOMEN PELVIS FINDINGS No intra-abdominal free air or free fluid. Hepatobiliary: The liver is unremarkable. No biliary dilatation. The gallbladder is poorly visualized and may be contracted or surgically absent. Faint 5 mm radiopaque focus in the region of the gallbladder (47/2) may represent a gallstone or a focus of calcification. Pancreas: Unremarkable. No pancreatic ductal dilatation or surrounding inflammatory changes. Spleen: Normal in size without focal abnormality. Adrenals/Urinary Tract: The adrenal glands unremarkable there is no hydronephrosis or nephrolithiasis on either  side. The visualized ureters appear unremarkable. The urinary bladder is mildly distended. There is diffuse thickened appearance of the bladder wall likely partly related to underdistention as well as due to chronic bladder outlet obstruction. Correlation  with urinalysis recommended to exclude superimposed UTI. Evaluation of the bladder is limited due to streak artifact caused by right hip arthroplasty. Stomach/Bowel: There is no bowel obstruction or active inflammation. The appendix is normal. Vascular/Lymphatic: Mild aortoiliac atherosclerotic disease. The IVC is unremarkable. No portal venous gas. No adenopathy. Reproductive: Enlarged prostate gland measuring 6 cm in transverse axial diameter. Other: None Musculoskeletal: Osteopenia with degenerative changes of the spine. Total right hip arthroplasty. No acute osseous pathology. IMPRESSION: 1. Focal faint ground-glass subpleural density in the anterior left upper lobe may represent an area of atelectasis or infiltrate. 2. No acute intra-abdominal or pelvic pathology. 3. Enlarged prostate gland with findings of chronic bladder outlet obstruction. Correlation with urinalysis recommended to exclude superimposed UTI. 4. No bowel obstruction. Normal appendix. 5.  Aortic Atherosclerosis (ICD10-I70.0). Electronically Signed   By: Vanetta Chou M.D.   On: 01/25/2024 18:08   DG Chest Port 1 View Result Date: 01/25/2024 CLINICAL DATA:  Altered mental status EXAM: PORTABLE CHEST 1 VIEW COMPARISON:  Chest x-ray 12/26/2023 FINDINGS: The heart is mildly enlarged. Left-sided pacemaker is present. The lungs are clear. There is no pleural effusion or pneumothorax. No acute fractures are seen. IMPRESSION: Mild cardiomegaly. No acute pulmonary process. Electronically Signed   By: Greig Pique M.D.   On: 01/25/2024 15:45     Procedures   Medications Ordered in the ED  lactated ringers  infusion (has no administration in time range)  vancomycin  (VANCOCIN ) IVPB 1000  mg/200 mL premix (has no administration in time range)  sodium chloride  0.9 % bolus 1,000 mL (0 mLs Intravenous Stopped 01/25/24 1815)  ceFEPIme  (MAXIPIME ) 2 g in sodium chloride  0.9 % 100 mL IVPB (0 g Intravenous Stopped 01/25/24 1624)  metroNIDAZOLE  (FLAGYL ) IVPB 500 mg (0 mg Intravenous Stopped 01/25/24 1812)  insulin  aspart (novoLOG ) injection 8 Units (8 Units Intravenous Given 01/25/24 1712)  sodium chloride  0.9 % bolus 1,000 mL (1,000 mLs Intravenous New Bag/Given 01/25/24 1833)                                    Medical Decision Making Amount and/or Complexity of Data Reviewed Labs: ordered. Radiology: ordered.  Risk Prescription drug management. Decision regarding hospitalization.   This patient presents to the ED for concern of ams, this involves an extensive number of treatment options, and is a complaint that carries with it a high risk of complications and morbidity.  The differential diagnosis includes sepsis, electrolyte abn, dka, infection   Co morbidities that complicate the patient evaluation  HTN, HLD, DM, CKD, complete HB s/p pacemaker placement, and paroxysmal afib (on Eliquis )   Additional history obtained:  Additional history obtained from epic chart review External records from outside source obtained and reviewed including EMS report/daughter   Lab Tests:  I Ordered, and personally interpreted labs.  The pertinent results include:  lactic elevated at 3.2, cbc with wbc elevated at 11.3; cmp with glucose elevated at 488, bun 47 and cr 3.08 (cr 2.26 on 8/15); vbg with pH 7.42; inr 1.0; covid/flu/rsv neg; trop sl elevated at 66   Imaging Studies ordered:  I ordered imaging studies including cxr and ct chest abd pelvis I independently visualized and interpreted imaging which showed  CXR: Mild cardiomegaly. No acute pulmonary process.  CT chest/abd/pelvis: Focal faint ground-glass subpleural density in the anterior left  upper lobe may represent an area of  atelectasis or infiltrate.  2. No acute intra-abdominal  or pelvic pathology.  3. Enlarged prostate gland with findings of chronic bladder outlet  obstruction. Correlation with urinalysis recommended to exclude  superimposed UTI.  4. No bowel obstruction. Normal appendix.  5.  Aortic Atherosclerosis (ICD10-I70.0).   I agree with the radiologist interpretation   Cardiac Monitoring:  The patient was maintained on a cardiac monitor.  I personally viewed and interpreted the cardiac monitored which showed an underlying rhythm of: st   Medicines ordered and prescription drug management:  I ordered medication including ivfs/abx  for sx  Reevaluation of the patient after these medicines showed that the patient improved I have reviewed the patients home medicines and have made adjustments as needed   Test Considered:  ct   Critical Interventions:  Ivfs/iv abx; code sepsis   Consultations Obtained:  I requested consultation with the hospitalist (Dr. Tobie),  and discussed lab and imaging findings as well as pertinent plan - he will admit   Problem List / ED Course:  Sepsis:  iv abx and ivfs started.  Lactic is worsening, so repeat fluids ordered Hematuria:  urine sent for cx.  Iv abx started.  Pt unable to say if he has dysuria or not CT with CAP:  iv abx started AKI:  ivfs started Hyperglycemia:  iv insulin  and ivfs given   Reevaluation:  After the interventions noted above, I reevaluated the patient and found that they have :improved   Social Determinants of Health:  Lives at snf   Dispostion:  After consideration of the diagnostic results and the patients response to treatment, I feel that the patent would benefit from admission.  CRITICAL CARE Performed by: Mliss Boyers   Total critical care time: 30 minutes  Critical care time was exclusive of separately billable procedures and treating other patients.  Critical care was necessary to treat or prevent  imminent or life-threatening deterioration.  Critical care was time spent personally by me on the following activities: development of treatment plan with patient and/or surrogate as well as nursing, discussions with consultants, evaluation of patient's response to treatment, examination of patient, obtaining history from patient or surrogate, ordering and performing treatments and interventions, ordering and review of laboratory studies, ordering and review of radiographic studies, pulse oximetry and re-evaluation of patient's condition.        Final diagnoses:  AKI (acute kidney injury) (HCC)  Dehydration  Sepsis with encephalopathy without septic shock, due to unspecified organism [facility])  Gross hematuria  Enlarged prostate  Bladder outlet obstruction  Community acquired pneumonia of left upper lobe of lung    ED Discharge Orders     None          Boyers Mliss, MD 01/25/24 1843

## 2024-01-26 ENCOUNTER — Encounter (HOSPITAL_COMMUNITY): Payer: Self-pay | Admitting: Internal Medicine

## 2024-01-26 DIAGNOSIS — I152 Hypertension secondary to endocrine disorders: Secondary | ICD-10-CM | POA: Diagnosis present

## 2024-01-26 DIAGNOSIS — N1832 Chronic kidney disease, stage 3b: Secondary | ICD-10-CM | POA: Diagnosis present

## 2024-01-26 DIAGNOSIS — N179 Acute kidney failure, unspecified: Secondary | ICD-10-CM | POA: Diagnosis not present

## 2024-01-26 DIAGNOSIS — I5042 Chronic combined systolic (congestive) and diastolic (congestive) heart failure: Secondary | ICD-10-CM | POA: Diagnosis present

## 2024-01-26 DIAGNOSIS — N320 Bladder-neck obstruction: Secondary | ICD-10-CM | POA: Diagnosis present

## 2024-01-26 DIAGNOSIS — G9341 Metabolic encephalopathy: Secondary | ICD-10-CM | POA: Diagnosis present

## 2024-01-26 DIAGNOSIS — B964 Proteus (mirabilis) (morganii) as the cause of diseases classified elsewhere: Secondary | ICD-10-CM | POA: Diagnosis present

## 2024-01-26 DIAGNOSIS — Z96641 Presence of right artificial hip joint: Secondary | ICD-10-CM | POA: Diagnosis present

## 2024-01-26 DIAGNOSIS — I5022 Chronic systolic (congestive) heart failure: Secondary | ICD-10-CM | POA: Diagnosis not present

## 2024-01-26 DIAGNOSIS — R4182 Altered mental status, unspecified: Secondary | ICD-10-CM | POA: Diagnosis present

## 2024-01-26 DIAGNOSIS — G3184 Mild cognitive impairment, so stated: Secondary | ICD-10-CM | POA: Diagnosis present

## 2024-01-26 DIAGNOSIS — E785 Hyperlipidemia, unspecified: Secondary | ICD-10-CM | POA: Diagnosis present

## 2024-01-26 DIAGNOSIS — N189 Chronic kidney disease, unspecified: Secondary | ICD-10-CM

## 2024-01-26 DIAGNOSIS — R451 Restlessness and agitation: Secondary | ICD-10-CM | POA: Diagnosis present

## 2024-01-26 DIAGNOSIS — I442 Atrioventricular block, complete: Secondary | ICD-10-CM | POA: Diagnosis present

## 2024-01-26 DIAGNOSIS — E119 Type 2 diabetes mellitus without complications: Secondary | ICD-10-CM | POA: Diagnosis not present

## 2024-01-26 DIAGNOSIS — Z7401 Bed confinement status: Secondary | ICD-10-CM | POA: Diagnosis not present

## 2024-01-26 DIAGNOSIS — I480 Paroxysmal atrial fibrillation: Secondary | ICD-10-CM

## 2024-01-26 DIAGNOSIS — Z7901 Long term (current) use of anticoagulants: Secondary | ICD-10-CM | POA: Diagnosis not present

## 2024-01-26 DIAGNOSIS — Z794 Long term (current) use of insulin: Secondary | ICD-10-CM | POA: Diagnosis not present

## 2024-01-26 DIAGNOSIS — E860 Dehydration: Secondary | ICD-10-CM | POA: Diagnosis present

## 2024-01-26 DIAGNOSIS — E1022 Type 1 diabetes mellitus with diabetic chronic kidney disease: Secondary | ICD-10-CM | POA: Diagnosis present

## 2024-01-26 DIAGNOSIS — Z1152 Encounter for screening for COVID-19: Secondary | ICD-10-CM | POA: Diagnosis not present

## 2024-01-26 DIAGNOSIS — E1065 Type 1 diabetes mellitus with hyperglycemia: Secondary | ICD-10-CM | POA: Diagnosis present

## 2024-01-26 DIAGNOSIS — E1059 Type 1 diabetes mellitus with other circulatory complications: Secondary | ICD-10-CM | POA: Diagnosis present

## 2024-01-26 DIAGNOSIS — Z79899 Other long term (current) drug therapy: Secondary | ICD-10-CM | POA: Diagnosis not present

## 2024-01-26 DIAGNOSIS — R531 Weakness: Secondary | ICD-10-CM | POA: Diagnosis not present

## 2024-01-26 DIAGNOSIS — N401 Enlarged prostate with lower urinary tract symptoms: Secondary | ICD-10-CM | POA: Diagnosis present

## 2024-01-26 DIAGNOSIS — N3001 Acute cystitis with hematuria: Secondary | ICD-10-CM | POA: Diagnosis not present

## 2024-01-26 LAB — BASIC METABOLIC PANEL WITH GFR
Anion gap: 14 (ref 5–15)
Anion gap: 13 (ref 5–15)
BUN: 42 mg/dL — ABNORMAL HIGH (ref 8–23)
BUN: 42 mg/dL — ABNORMAL HIGH (ref 8–23)
CO2: 22 mmol/L (ref 22–32)
CO2: 24 mmol/L (ref 22–32)
Calcium: 9.1 mg/dL (ref 8.9–10.3)
Calcium: 9.1 mg/dL (ref 8.9–10.3)
Chloride: 102 mmol/L (ref 98–111)
Chloride: 101 mmol/L (ref 98–111)
Creatinine, Ser: 2.48 mg/dL — ABNORMAL HIGH (ref 0.61–1.24)
Creatinine, Ser: 2.33 mg/dL — ABNORMAL HIGH (ref 0.61–1.24)
GFR, Estimated: 24 mL/min — ABNORMAL LOW (ref >60)
GFR, Estimated: 26 mL/min — ABNORMAL LOW (ref >60)
Glucose, Bld: 337 mg/dL — ABNORMAL HIGH (ref 70–99)
Glucose, Bld: 97 mg/dL (ref 70–99)
Potassium: 3.7 mmol/L (ref 3.5–5.1)
Potassium: 3.6 mmol/L (ref 3.5–5.1)
Sodium: 138 mmol/L (ref 135–145)
Sodium: 138 mmol/L (ref 135–145)

## 2024-01-26 LAB — CBC
HCT: 43.7 % (ref 39.0–52.0)
Hemoglobin: 13.7 g/dL (ref 13.0–17.0)
MCH: 29.5 pg (ref 26.0–34.0)
MCHC: 31.4 g/dL (ref 30.0–36.0)
MCV: 94.2 fL (ref 80.0–100.0)
Platelets: 205 K/uL (ref 150–400)
RBC: 4.64 MIL/uL (ref 4.22–5.81)
RDW: 15.1 % (ref 11.5–15.5)
WBC: 9.2 K/uL (ref 4.0–10.5)
nRBC: 0 % (ref 0.0–0.2)

## 2024-01-26 LAB — GLUCOSE, CAPILLARY
Glucose-Capillary: 297 mg/dL — ABNORMAL HIGH (ref 70–99)
Glucose-Capillary: 308 mg/dL — ABNORMAL HIGH (ref 70–99)
Glucose-Capillary: 269 mg/dL — ABNORMAL HIGH (ref 70–99)
Glucose-Capillary: 89 mg/dL (ref 70–99)
Glucose-Capillary: 367 mg/dL — ABNORMAL HIGH (ref 70–99)

## 2024-01-26 LAB — PSA: Prostatic Specific Antigen: 6.75 ng/mL — ABNORMAL HIGH (ref 0.00–4.00)

## 2024-01-26 MED ORDER — INSULIN ASPART 100 UNIT/ML IJ SOLN
0.0000 [IU] | Freq: Three times a day (TID) | INTRAMUSCULAR | Status: DC
  Administered 2024-01-26: 8 [IU] via SUBCUTANEOUS
  Administered 2024-01-26: 11 [IU] via SUBCUTANEOUS
  Administered 2024-01-27: 3 [IU] via SUBCUTANEOUS
  Administered 2024-01-27: 5 [IU] via SUBCUTANEOUS
  Administered 2024-01-27: 8 [IU] via SUBCUTANEOUS

## 2024-01-26 MED ORDER — ACETAMINOPHEN 650 MG RE SUPP: 650.0000 mg | Freq: Four times a day (QID) | RECTAL | Status: DC | PRN

## 2024-01-26 MED ORDER — GLUCERNA SHAKE PO LIQD
237.0000 mL | ORAL | Status: DC
  Administered 2024-01-27: 237 mL via ORAL
  Filled 2024-01-26 (×2): qty 237

## 2024-01-26 MED ORDER — LACTATED RINGERS IV SOLN: INTRAVENOUS | Status: DC

## 2024-01-26 MED ORDER — ACETAMINOPHEN 325 MG PO TABS
650.0000 mg | ORAL_TABLET | ORAL | Status: DC | PRN
  Administered 2024-01-26: 650 mg via ORAL
  Filled 2024-01-26: qty 2

## 2024-01-26 MED ORDER — INSULIN GLARGINE 100 UNIT/ML ~~LOC~~ SOLN
15.0000 [IU] | Freq: Every day | SUBCUTANEOUS | Status: DC
  Administered 2024-01-26: 15 [IU] via SUBCUTANEOUS
  Filled 2024-01-26 (×2): qty 0.15

## 2024-01-26 MED ORDER — ALLOPURINOL 100 MG PO TABS
100.0000 mg | ORAL_TABLET | Freq: Every day | ORAL | Status: DC
  Administered 2024-01-26 – 2024-01-27 (×2): 100 mg via ORAL
  Filled 2024-01-26 (×2): qty 1

## 2024-01-26 NOTE — NC FL2 (Signed)
 [place]  MEDICAID FL2 LEVEL OF CARE FORM     IDENTIFICATION  Patient Name: Connor Dixon Birthdate: [birth year] Sex: male Admission Date (Current Location): 01/25/2024  [facility] and [place] Number:  Producer, television/film/video and Address:  [facility],  [address] [place], [place] [address]      Provider Number: [id number]  Attending Physician Name and Address:  Jadine Toribio SQUIBB, MD  Relative Name and Phone Number:  [name] Yarbrough(Son)[phone]    Current Level of Care: Hospital Recommended Level of Care: Assisted Living Facility Prior Approval Number:    Date Approved/Denied:   PASRR Number:    Discharge Plan: Other (Comment) (ALF)    Current Diagnoses: Patient Active Problem List   Diagnosis Date Noted   Chronic heart failure with reduced ejection fraction (HFrEF, <= 40%) (HCC) 01/25/2024   Acute cystitis with hematuria 01/25/2024   Acute exacerbation of CHF (congestive heart failure) (HCC) 12/27/2023   Acute on chronic heart failure with preserved ejection fraction (HFpEF) (HCC) 12/26/2023   Acute kidney injury superimposed on chronic kidney disease (HCC) 12/26/2023   Heart block AV complete (HCC) 12/26/2023   Palliative care by specialist 10/16/2023   Acute encephalopathy 10/10/2023   Elevated troponin 10/10/2023   Prolonged QT interval 10/10/2023   CKD stage 4 due to type 2 diabetes mellitus (HCC) 09/06/2023   Seasonal allergic rhinitis due to pollen 09/06/2023   Post-traumatic osteoarthritis of left shoulder 01/05/2023   Paroxysmal atrial fibrillation (HCC) 09/03/2021   Diabetic nephropathy associated with type 2 diabetes mellitus (HCC) 06/09/2021   Vitamin D  deficiency disease 06/09/2021   Insulin -requiring or dependent type II diabetes mellitus (HCC) 06/09/2021   Encounter for general adult medical examination with abnormal findings 06/09/2020   Sinus node dysfunction (HCC) 02/13/2019   Presence of permanent cardiac pacemaker 07/22/2010    Hyperlipidemia with target LDL less than 70 08/06/2007   Hypertension associated with diabetes (HCC) 08/06/2007    Orientation RESPIRATION BLADDER Height & Weight     Self  Normal Incontinent Weight: 78.3 kg Height:  5' 8 (172.7 cm)  BEHAVIORAL SYMPTOMS/MOOD NEUROLOGICAL BOWEL NUTRITION STATUS      Incontinent Diet (Dysphagia)  AMBULATORY STATUS COMMUNICATION OF NEEDS Skin   Extensive Assist Verbally Normal                       Personal Care Assistance Level of Assistance  Bathing, Feeding, Dressing Bathing Assistance: Limited assistance Feeding assistance: Limited assistance Dressing Assistance: Limited assistance     Functional Limitations Info  Sight, Hearing, Speech Sight Info: Adequate Hearing Info: Adequate Speech Info: Adequate    SPECIAL CARE FACTORS FREQUENCY  PT (By licensed PT), OT (By licensed OT)     PT Frequency: 2x week OT Frequency: 2x week            Contractures Contractures Info: Not present    Additional Factors Info  Code Status, Allergies Code Status Info: Full Allergies Info: Oysters (Shellfish Allergy), Citrus, Pecan Extract, Cabbage, Lemon Oil, Walnut, Apple Juice, Farxiga  (Dapagliflozin )           Current Medications (01/26/2024):  This is the current hospital active medication list Current Facility-Administered Medications  Medication Dose Route Frequency Provider Last Rate Last Admin   acetaminophen  (TYLENOL ) tablet 650 mg  650 mg Oral Q4H PRN Jadine Toribio SQUIBB, MD   650 mg at 01/26/24 1552   Or   acetaminophen  (TYLENOL ) suppository 650 mg  650 mg Rectal Q6H PRN Jadine,  Toribio SQUIBB, MD       allopurinol  (ZYLOPRIM ) tablet 100 mg  100 mg Oral Daily Jadine Toribio SQUIBB, MD   100 mg at 01/26/24 0941   apixaban  (ELIQUIS ) tablet 2.5 mg  2.5 mg Oral BID Patel, Vishal R, MD   2.5 mg at 01/26/24 0940   bisacodyl  (DULCOLAX) EC tablet 5 mg  5 mg Oral Daily PRN Tobie Jorie SAUNDERS, MD       cefTRIAXone  (ROCEPHIN ) 2 g in sodium chloride  0.9 %  100 mL IVPB  2 g Intravenous Q24H Patel, Vishal R, MD 200 mL/hr at 01/26/24 1558 2 g at 01/26/24 1558   feeding supplement (GLUCERNA SHAKE) (GLUCERNA SHAKE) liquid 237 mL  237 mL Oral Q24H Jadine Toribio SQUIBB, MD       hydrALAZINE  (APRESOLINE ) tablet 10 mg  10 mg Oral TID Patel, Vishal R, MD   10 mg at 01/26/24 0940   insulin  aspart (novoLOG ) injection 0-15 Units  0-15 Units Subcutaneous TID WC Jadine Toribio SQUIBB, MD   5 Units at 01/26/24 1231   insulin  aspart (novoLOG ) injection 0-5 Units  0-5 Units Subcutaneous QHS Patel, Vishal R, MD   5 Units at 01/25/24 2310   insulin  glargine (LANTUS ) injection 15 Units  15 Units Subcutaneous QHS Jadine Toribio SQUIBB, MD       lactated ringers  infusion   Intravenous Continuous Jadine Toribio SQUIBB, MD       metoprolol  succinate (TOPROL -XL) 24 hr tablet 25 mg  25 mg Oral Daily Patel, Vishal R, MD   25 mg at 01/26/24 0940   prochlorperazine  (COMPAZINE ) injection 10 mg  10 mg Intravenous Q6H PRN Patel, Vishal R, MD       senna-docusate (Senokot-S) tablet 1 tablet  1 tablet Oral QHS PRN Patel, Vishal R, MD       sodium chloride  flush (NS) 0.9 % injection 3 mL  3 mL Intravenous Q12H Patel, Vishal R, MD   3 mL at 01/26/24 0941   tamsulosin  (FLOMAX ) capsule 0.4 mg  0.4 mg Oral QPC breakfast Patel, Vishal R, MD   0.4 mg at 01/26/24 9058     Discharge Medications: Please see discharge summary for a list of discharge medications.  Relevant Imaging Results:  Relevant Lab Results:   Additional Information SS#[phone]  Ryver Poblete, Nathanel, RN

## 2024-01-26 NOTE — TOC Initial Note (Addendum)
 Transition of Care [facility]) - Initial/Assessment Note    Patient Details  Name: Connor Dixon MRN: [id number] Date of Birth: [birth year]  Transition of Care [facility]) CM/SW Contact:    Bascom Service, RN Phone Number: 01/26/2024, 10:37 AM  Clinical Narrative: Confirmed from [facility] rep Jenkins, & [name](son) d/c plans to return. Uses w/c min asst. Await PT eval/recc. -4p- PT recc HHPT(facility has own PT)Spoke to [facility] rep Precious-fax signed fl2 to fax#[phone], returning back to rm#[phone], report tel# main tel#[phone].update fl2 once signed, & fax to facility. [facility] aware of d/c tomorrow. PTAR @ d/c. -4:53p Faxed manual to fax#[phone]/e faxed to [phone].                 Expected Discharge Plan: Assisted Living Barriers to Discharge: Continued Medical Work up   Patient Goals and CMS Choice Patient states their goals for this hospitalization and ongoing recovery are:: [facility] CMS Medicare.gov Compare Post Acute Care list provided to:: Patient Represenative (must comment) ([name](Son)) Choice offered to / list presented to : Adult Children [place] ownership interest in [facility].provided to:: Adult Children    Expected Discharge Plan and Services   Discharge Planning Services: CM Consult Post Acute Care Choice: Home Health Living arrangements for the past 2 months: Assisted Living Facility                                      Prior Living Arrangements/Services Living arrangements for the past 2 months: Assisted Living Facility Lives with:: Facility Resident   Do you feel safe going back to the place where you live?: Yes          Current home services: DME (rw,cane,w/c,3n1)    Activities of Daily Living   ADL Screening (condition at time of admission) Independently performs ADLs?: No Does the patient have a NEW difficulty with bathing/dressing/toileting/self-feeding that is expected to last >3 days?: No Does the  patient have a NEW difficulty with getting in/out of bed, walking, or climbing stairs that is expected to last >3 days?: No Does the patient have a NEW difficulty with communication that is expected to last >3 days?: Yes (Initiates electronic notice to provider for possible SLP consult) Is the patient deaf or have difficulty hearing?: No Does the patient have difficulty seeing, even when wearing glasses/contacts?: No Does the patient have difficulty concentrating, remembering, or making decisions?: Yes  Permission Sought/Granted Permission sought to share information with : Case Manager Permission granted to share information with : Yes, Verbal Permission Granted              Emotional Assessment              Admission diagnosis:  Dehydration [E86.0] Enlarged prostate [N40.0] Gross hematuria [R31.0] Acute cystitis with hematuria [N30.01] AKI (acute kidney injury) (HCC) [N17.9] Bladder outlet obstruction [N32.0] Community acquired pneumonia of left upper lobe of lung [J18.9] Sepsis with encephalopathy without septic shock, due to unspecified organism (HCC) [A41.9, R65.20, G93.41] Patient Active Problem List   Diagnosis Date Noted   Chronic heart failure with reduced ejection fraction (HFrEF, <= 40%) (HCC) 01/25/2024   Acute cystitis with hematuria 01/25/2024   Acute exacerbation of CHF (congestive heart failure) (HCC) 12/27/2023   Acute on chronic heart failure with preserved ejection fraction (HFpEF) (HCC) 12/26/2023   Acute kidney injury superimposed on chronic kidney  disease (HCC) 12/26/2023   Heart block AV complete (HCC) 12/26/2023   Palliative care by specialist 10/16/2023   Acute encephalopathy 10/10/2023   Elevated troponin 10/10/2023   Prolonged QT interval 10/10/2023   CKD stage 4 due to type 2 diabetes mellitus (HCC) 09/06/2023   Seasonal allergic rhinitis due to pollen 09/06/2023   Post-traumatic osteoarthritis of left shoulder 01/05/2023   Paroxysmal atrial  fibrillation (HCC) 09/03/2021   Diabetic nephropathy associated with type 2 diabetes mellitus (HCC) 06/09/2021   Vitamin D  deficiency disease 06/09/2021   Insulin -requiring or dependent type II diabetes mellitus (HCC) 06/09/2021   Encounter for general adult medical examination with abnormal findings 06/09/2020   Sinus node dysfunction (HCC) 02/13/2019   Presence of permanent cardiac pacemaker 07/22/2010   Hyperlipidemia with target LDL less than 70 08/06/2007   Hypertension associated with diabetes (HCC) 08/06/2007   PCP:  [name], MD Pharmacy:   [facility], [place] - [address] [place] [address] Phone: [phone] Fax: [phone]     Social Drivers of Health (SDOH) Social History: SDOH Screenings   Food Insecurity: Patient Unable To Answer (12/26/2023)  Housing: Unknown (12/26/2023)  Transportation Needs: Patient Unable To Answer (12/26/2023)  Utilities: Patient Unable To Answer (12/26/2023)  Alcohol  Screen: Low Risk  (04/06/2023)  Depression (PHQ2-9): Low Risk  (09/06/2023)  Financial Resource Strain: Low Risk  (04/06/2023)  Physical Activity: Inactive (04/06/2023)  Social Connections: Unknown (01/25/2024)  Stress: No Stress Concern Present (04/06/2023)  Tobacco Use: Low Risk  (01/26/2024)  Health Literacy: Adequate Health Literacy (04/06/2023)   SDOH Interventions:     Readmission Risk Interventions     No data to display

## 2024-01-26 NOTE — Care Management Obs Status (Signed)
 MEDICARE OBSERVATION STATUS NOTIFICATION   Patient Details  Name: Connor Dixon MRN: [id number] Date of Birth: 1934/09/30   Medicare Observation Status Notification Given:  Yes    MahabirNathanel, RN 01/26/2024, 3:29 PM

## 2024-01-26 NOTE — Evaluation (Addendum)
 Clinical/Bedside Swallow Evaluation Patient Details  Name: Connor Dixon MRN: [id number] Date of Birth: 1934/12/25  Today's Date: 01/26/2024 Time: SLP Start Time (ACUTE ONLY): 0950 SLP Stop Time (ACUTE ONLY): 1021 SLP Time Calculation (min) (ACUTE ONLY): 31 min  Past Medical History:  Past Medical History:  Diagnosis Date   Acute on chronic diastolic congestive heart failure (HCC)    Arthus phenomenon    Atrioventricular block, complete (HCC)    Cardiac pacemaker in situ 07/22/2010   Chronic kidney disease, stage 3 (HCC)    Presumably from longstanding DM & HTN. Scr has gradually increased over the past 2 years (1.6 in 12/2010, 2 in 03/2011, and up to 2.1 in 01/2013)   DIABETES MELLITUS, TYPE I, ADULT ONSET 08/06/2007   DM (diabetes mellitus) (HCC)    adult onset    Essential hypertension, benign 08/06/2007   Very good BP control   History of second degree heart block    HLD (hyperlipidemia)    HYPERLIPIDEMIA 08/06/2007   Hypopotassemia    Morbid obesity (HCC)    Obesity    Other and unspecified angina pectoris    typical   Type I (juvenile type) diabetes mellitus without mention of complication, not stated as uncontrolled    (04/25/13 OV with Dr. Rayburn) = Poorly controlled & is working  w/Dr. Karlos to improve this. Most recent Hgb A1c was 8.2% but Mr. Capell reports it was down to 7.6 last month.   Past Surgical History:  Past Surgical History:  Procedure Laterality Date   CATARACT EXTRACTION  8/08   [place]   PPM GENERATOR CHANGEOUT N/A 02/13/2019   Procedure: PPM GENERATOR CHANGEOUT;  Surgeon: Waddell Danelle ORN, MD;  Location: [facility] INVASIVE CV LAB;  Service: Cardiovascular;  Laterality: N/A;   PTVDP  12/11   PPM - [employer]   RIGHT HEART CATH N/A 12/29/2023   Procedure: RIGHT HEART CATH;  Surgeon: Cherrie Toribio SAUNDERS, MD;  Location: [facility] INVASIVE CV LAB;  Service: Cardiovascular;  Laterality: N/A;   TOTAL HIP ARTHROPLASTY  04/05/06   right   HPI:  89 yo male adm to [facility]  with AMS/agitation - likely due to cystitis with hematuria, AKI on CKD3b. PMH + for DM, progressive cognitive decline, Parkinsonism features - seen by Dr Wallis neurology July 9th for concern for cognitive deficits. Pt was admit to [facility] 3 months ago after being found down - and dc'd to ALF.  He had extensive work up for source of cog changes 3 months ago which were unrevealing.   He is on a mechanical soft/thin, consistent and NAS diet at his facility.  Swallow eval requested by RN due to pt being on mech soft diet PTA.  Pt has a MOST form completed in his hard chart.  He was seen by speech for cognitive linguistic treatments and swallow eval at [facility]. Per chart reviewe, pt does get one vanilla Glucerna daily at his facility.    Assessment / Plan / Recommendation  Clinical Impression  Patient presents with clinical indication of mild oral dysphagia - likely due to his cognitive deficits. He does not consistently follow directions but is able to mimic approx 75% of times.  No focal CN deficits present and pt is able to feed himself.  Did not conduct 3 ounce yale due to fluid restriction.  No s/s of aspiration across all po observed including solids, puree, water and medications with water.  Prolonged mastication noted up to 1.5 minutes but pt able to clear 90%  independently.     Suspect he requires mech soft diet at his facility due to his cog deficits and this diet is appropriate at this time in this SLP's opinion.   Of note, please assure pt is upright before meals as he slides down readily after being slid up - ? Due to chronic bladder outlet obstruction causing discomfort.   SLP Visit Diagnosis: Dysphagia, oral phase (R13.11)    Aspiration Risk  Mild aspiration risk    Diet Recommendation Dysphagia 3 (Mech soft);Thin liquid    Liquid Administration via: Straw Medication Administration: Whole meds with liquid Supervision: Patient able to self feed Compensations: Slow rate;Small  sips/bites Postural Changes: Seated upright at 90 degrees;Remain upright for at least 30 minutes after po intake    Other  Recommendations Oral Care Recommendations: Oral care BID     Assistance Recommended at Discharge    Functional Status Assessment Patient has not had a recent decline in their functional status  Frequency and Duration     na       Prognosis    na    Swallow Study   General Date of Onset: 01/26/24 HPI: 89 yo male adm to [facility] with AMS/agitation - likely due to cystitis with hematuria, AKI on CKD3b. PMH + for DM, progressive cognitive decline, Parkinsonism features - seen by Dr Wallis neurology July 9th for concern for cognitive deficits. Pt was admit to [facility] 3 months ago after being found down - and dc'd to ALF.  He had extensive work up for source of cog changes 3 months ago which were unrevealing.   He is on a mechanical soft/thin, consistent and NAS diet at his facility.  Swallow eval requested by RN due to pt being on mech soft diet PTA.  Pt has a MOST form completed in his hard chart.  He was seen by speech for cognitive linguistic treatments and swallow eval at [facility]. Type of Study: Bedside Swallow Evaluation Previous Swallow Assessment: during May-June admit Diet Prior to this Study: Regular;Thin liquids (Level 0) Temperature Spikes Noted: No Respiratory Status: Room air History of Recent Intubation: No Behavior/Cognition: Alert;Cooperative;Pleasant mood Oral Cavity Assessment: Within Functional Limits Oral Care Completed by SLP: Yes Oral Cavity - Dentition: Adequate natural dentition Vision: Functional for self-feeding Self-Feeding Abilities: Able to feed self Patient Positioning: Upright in bed Baseline Vocal Quality: Normal Volitional Cough: Cognitively unable to elicit Volitional Swallow: Unable to elicit    Oral/Motor/Sensory Function Overall Oral Motor/Sensory Function: Within functional limits   Ice Chips Ice chips: Not tested   Thin Liquid Thin  Liquid: Within functional limits Presentation: Straw;Self Fed    Nectar Thick Nectar Thick Liquid: Not tested   Honey Thick Honey Thick Liquid: Not tested   Puree Puree: Within functional limits Presentation: Self Fed;Spoon   Solid     Solid: Impaired Presentation: Self Fed Oral Phase Impairments: Impaired mastication Oral Phase Functional Implications: Prolonged oral transit;Impaired mastication;Other (comment) (prolonged mastication)      Nicolas Emmie Caldron 01/26/2024,10:22 AM  Madelin POUR, MS [facility] SLP Acute Rehab Services Office [phone]

## 2024-01-26 NOTE — Evaluation (Signed)
 Physical Therapy Evaluation Patient Details Name: Connor Dixon MRN: [id number] DOB: September 06, 1934 Today's Date: 01/26/2024  History of Present Illness  Pt is an 89 y.o. male admitted 01/25/24 for hematuria, cystitis, acute kidney injury, acute encephalopathy. PMHx: chronic combined systolic/diastolic CHF, PAF on apixaban , complete heart block status post pacemaker placement, mild cognitive impairment  Clinical Impression  Pt admitted with above diagnosis.  Pt currently with functional limitations due to the deficits listed below (see PT Problem List). Pt will benefit from acute skilled PT to increase their independence and safety with mobility to allow discharge.  Pt currently requiring mod assist for bed mobility and min assist to transfer OOB to recliner. Pt appears to require a little assist to transfer and uses w/c at facility however pt is poor historian.  Pt likely close to his baseline, would defer if HHPT would be beneficial to ALF.         If plan is discharge home, recommend the following: A little help with walking and/or transfers;A little help with bathing/dressing/bathroom;Supervision due to cognitive status   Can travel by private vehicle        Equipment Recommendations None recommended by PT  Recommendations for Other Services       Functional Status Assessment Patient has had a recent decline in their functional status and demonstrates the ability to make significant improvements in function in a reasonable and predictable amount of time.     Precautions / Restrictions Precautions Precautions: Fall      Mobility  Bed Mobility Overal bed mobility: Needs Assistance Bed Mobility: Supine to Sit     Supine to sit: Mod assist, HOB elevated     General bed mobility comments: assisted with bringing LEs over EOB and sitting upright    Transfers Overall transfer level: Needs assistance Equipment used: Rolling walker (2 wheels) Transfers: Sit to/from Stand, Bed to  chair/wheelchair/BSC Sit to Stand: Min assist   Step pivot transfers: Min assist       General transfer comment: multimodal cues for technique, increased time to process and perform, assist to rise and stabilize    Ambulation/Gait                  Stairs            Wheelchair Mobility     Tilt Bed    Modified Rankin (Stroke Patients Only)       Balance Overall balance assessment: Needs assistance Sitting-balance support: Feet supported, Bilateral upper extremity supported Sitting balance-Leahy Scale: Poor     Standing balance support: Bilateral upper extremity supported, During functional activity, Reliant on assistive device for balance Standing balance-Leahy Scale: Poor                               Pertinent Vitals/Pain Pain Assessment Pain Assessment: No/denies pain    Home Living Family/patient expects to be discharged to:: Assisted living                   Additional Comments: [facility]    Prior Function Prior Level of Function : Needs assist;Patient poor historian/Family not available             Mobility Comments: Prior to 10/13/23, pt was independent without AD. However pt has had previous admissions and discharged to SNF, and now currently admitted from ALF; per notes and pt - pt able to transfer with assist and uses w/c for  mobilizing       Extremity/Trunk Assessment        Lower Extremity Assessment Lower Extremity Assessment: Generalized weakness    Cervical / Trunk Assessment Cervical / Trunk Assessment: Normal  Communication   Communication Factors Affecting Communication: Difficulty expressing self    Cognition Arousal: Alert Behavior During Therapy: WFL for tasks assessed/performed   PT - Cognitive impairments: History of cognitive impairments, Safety/Judgement, Problem solving, Initiation, Sequencing                       PT - Cognition Comments: hx mild cognitive  impairment Following commands: Impaired Following commands impaired: Follows one step commands with increased time, Follows one step commands inconsistently     Cueing Cueing Techniques: Verbal cues, Gestural cues, Tactile cues     General Comments      Exercises     Assessment/Plan    PT Assessment Patient needs continued PT services  PT Problem List Decreased strength;Decreased activity tolerance;Decreased balance;Decreased mobility;Decreased knowledge of use of DME;Decreased cognition       PT Treatment Interventions DME instruction;Balance training;Functional mobility training;Therapeutic activities;Therapeutic exercise;Patient/family education;Wheelchair mobility training;Gait training    PT Goals (Current goals can be found in the Care Plan section)  Acute Rehab PT Goals PT Goal Formulation: With patient Time For Goal Achievement: 02/09/24 Potential to Achieve Goals: Good    Frequency Min 2X/week     Co-evaluation               AM-PAC PT 6 Clicks Mobility  Outcome Measure Help needed turning from your back to your side while in a flat bed without using bedrails?: A Lot Help needed moving from lying on your back to sitting on the side of a flat bed without using bedrails?: A Lot Help needed moving to and from a bed to a chair (including a wheelchair)?: A Lot Help needed standing up from a chair using your arms (e.g., wheelchair or bedside chair)?: A Lot Help needed to walk in hospital room?: Total Help needed climbing 3-5 steps with a railing? : Total 6 Click Score: 10    End of Session Equipment Utilized During Treatment: Gait belt Activity Tolerance: Patient tolerated treatment well Patient left: in chair;with call bell/phone within reach;with family/visitor present (niece in room) Nurse Communication: Mobility status Training and development officer and NT notified that pt on chair alarm pad but no box in room, niece currently present) PT Visit Diagnosis: Unsteadiness on  feet (R26.81);Muscle weakness (generalized) (M62.81)    Time: 8586-8572 PT Time Calculation (min) (ACUTE ONLY): 14 min   Charges:   PT Evaluation $PT Eval Low Complexity: 1 Low   PT General Charges $$ ACUTE PT VISIT: 1 Visit        Tari PT, DPT Physical Therapist Acute Rehabilitation Services Office: [phone]   Tari CROME [place] 01/26/2024, 3:58 PM

## 2024-01-26 NOTE — Progress Notes (Addendum)
  Progress Note   Patient: Connor Dixon FMW:[id number] DOB: 29-Oct-1934 DOA: 01/25/2024     0 DOS: the patient was seen and examined on 01/26/2024   Brief hospital course: 89 year old male PMH including chronic combined systolic/diastolic CHF, PAF on apixaban , complete heart block status post pacemaker placement, mild cognitive impairment, presented with hematuria, altered mental status and agitation.  Admitted for hematuria, cystitis, acute kidney injury, acute encephalopathy.  Consultants None   Procedures/Events 9/11 admit for UTI, acute encephalopathy   Assessment and Plan: Acute UTI with hematuria Acute metabolic encephalopathy Mild cognitive impairment Prostatomegaly with chronic bladder outlet obstruction Patient was treated with antibiotics and fluids, appears to have some clinical improvement.  May be approaching baseline although that is currently unclear.  Confused and unable to offer history.   Continue Flomax , continue empiric antibiotics, follow-up culture data   Acute kidney injury superimposed on CKD stage IIIb: Creatinine 3.08 on admission compared to recent baseline 1.9-2.2.  Suspect secondary to above.  He does not have any peripheral edema. Creatinine improved back to 2.48, similar to almost 1 month ago. Will resume fluids and recheck BMP in AM.    Chronic combined systolic/diastolic CHF  Last EF 25-30% by TTE 12/27/2023.  No evidence of volume overload at time of admission.  Holding torsemide  for AKI above.  Continue Toprol -XL.   Paroxysmal atrial fibrillation CHB s/p PPM: Paced rhythm.  Continue Toprol -XL and apixaban .   Insulin -dependent type 2 diabetes with hyperglycemia: Markedly hyperglycemic.  Last hemoglobin A1c 8.7.  April 2025. Check HgbA1c Increase long-acting insulin , change sliding scale to moderate, may need to add meal coverage   Hypertension: Continue Toprol -XL and hydralazine .  Cognitive impairment    Subjective:  Feels ok, no  complaints History unreliable  Physical Exam: Vitals:   01/26/24 0154 01/26/24 0620 01/26/24 1002 01/26/24 1444  BP: 113/78 126/72 (!) 148/78 107/64  Pulse: (!) 101 92 90 83  Resp: 18 20 18 19   Temp: 98.5 F (36.9 C) 97.8 F (36.6 C) 98 F (36.7 C) 97.7 F (36.5 C)  TempSrc:   Oral Oral  SpO2: 97% 99% 100% 100%  Weight:  78.3 kg    Height:  5' 8 (1.727 m)     Physical Exam Vitals (AFVSS) reviewed.  Constitutional:      General: He is not in acute distress.    Appearance: He is not ill-appearing or toxic-appearing.  Cardiovascular:     Rate and Rhythm: Normal rate and regular rhythm.     Heart sounds: No murmur heard. Pulmonary:     Effort: Pulmonary effort is normal. No respiratory distress.     Breath sounds: No wheezing, rhonchi or rales.  Neurological:     Mental Status: He is alert.  Psychiatric:        Mood and Affect: Mood normal.        Behavior: Behavior normal.     Data Reviewed: CBG high up to 498 Creatinine down to 2.48 CBC within normal limits Chest x-ray no acute disease EKG paced rhythm  Family Communication: son [name] by telephone  Disposition: Status is: Observation      Time spent: 35 minutes  Author: Toribio Door, MD 01/26/2024 3:29 PM  For on call review [email or web address].

## 2024-01-27 ENCOUNTER — Other Ambulatory Visit (HOSPITAL_COMMUNITY): Payer: Self-pay

## 2024-01-27 DIAGNOSIS — N179 Acute kidney failure, unspecified: Secondary | ICD-10-CM | POA: Diagnosis not present

## 2024-01-27 DIAGNOSIS — Z794 Long term (current) use of insulin: Secondary | ICD-10-CM

## 2024-01-27 DIAGNOSIS — N3001 Acute cystitis with hematuria: Secondary | ICD-10-CM | POA: Diagnosis not present

## 2024-01-27 DIAGNOSIS — I480 Paroxysmal atrial fibrillation: Secondary | ICD-10-CM | POA: Diagnosis not present

## 2024-01-27 DIAGNOSIS — E119 Type 2 diabetes mellitus without complications: Secondary | ICD-10-CM

## 2024-01-27 LAB — HEMOGLOBIN A1C
Hgb A1c MFr Bld: 9.1 % — ABNORMAL HIGH (ref 4.8–5.6)
Mean Plasma Glucose: 214.47 mg/dL

## 2024-01-27 LAB — URINE CULTURE: Culture: 10000 — AB

## 2024-01-27 LAB — GLUCOSE, CAPILLARY
Glucose-Capillary: 262 mg/dL — ABNORMAL HIGH (ref 70–99)
Glucose-Capillary: 151 mg/dL — ABNORMAL HIGH (ref 70–99)
Glucose-Capillary: 202 mg/dL — ABNORMAL HIGH (ref 70–99)

## 2024-01-27 MED ORDER — CEFADROXIL 500 MG PO CAPS
1000.0000 mg | ORAL_CAPSULE | Freq: Every day | ORAL | 0 refills | Status: DC
  Filled 2024-01-27: qty 5, 2d supply, fill #0

## 2024-01-27 MED ORDER — CEFADROXIL 500 MG PO CAPS
1000.0000 mg | ORAL_CAPSULE | Freq: Every day | ORAL | Status: DC
  Administered 2024-01-27: 1000 mg via ORAL
  Filled 2024-01-27: qty 2

## 2024-01-27 MED ORDER — CEFADROXIL 500 MG PO CAPS: 1000.0000 mg | ORAL_CAPSULE | Freq: Every day | ORAL | 0 refills | Status: DC

## 2024-01-27 NOTE — Progress Notes (Signed)
 Pt awaiting for transportation, meds at bedside with belongings, IV and teli removed. Called for report

## 2024-01-27 NOTE — Discharge Summary (Signed)
 Physician Discharge Summary   Patient: Connor Dixon MRN: [id number] DOB: 1935/01/22  Admit date:     01/25/2024  Discharge date: 01/27/24  Discharge Physician: Toribio Door   PCP: [name], MD   Recommendations at discharge:   Acute kidney injury superimposed on CKD stage IIIb: Creatinine improved, trending down, last checked 2.33, expect spontaneous return to baseline. Repeat BMP as an outpatient 1 week    Discharge Diagnoses: Principal Problem:   Acute cystitis with hematuria Active Problems:   Acute kidney injury superimposed on chronic kidney disease (HCC)   Acute encephalopathy   Chronic heart failure with reduced ejection fraction (HFrEF, <= 40%) (HCC)   Hypertension associated with diabetes (HCC)   Presence of permanent cardiac pacemaker   Insulin -requiring or dependent type II diabetes mellitus (HCC)   Paroxysmal atrial fibrillation (HCC)  Resolved Problems:   * No resolved hospital problems. *  Hospital Course: 89 year old male PMH including chronic combined systolic/diastolic CHF, PAF on apixaban , complete heart block status post pacemaker placement, mild cognitive impairment, presented with hematuria, altered mental status and agitation.  Admitted for hematuria, cystitis, acute kidney injury, acute encephalopathy.  Consultants None   Procedures/Events 9/11 admit for UTI, acute encephalopathy   Acute Proteus UTI with hematuria Acute metabolic encephalopathy Mild cognitive impairment Prostatomegaly with chronic bladder outlet obstruction Treated with antibiotics and fluids with clinical improvement.  Likely at baseline mentally. Continue Flomax , changed to oral antibiotics complete as outpatient.   Acute kidney injury superimposed on CKD stage IIIb: Creatinine 3.08 on admission compared to recent baseline 1.9-2.2.  Suspect secondary to above.   Creatinine improved, trending down, last checked 2.33, expect spontaneous return to baseline. Repeat BMP  as an outpatient 1 week    Chronic combined systolic/diastolic CHF  Last EF 25-30% by TTE 12/27/2023.  No evidence of volume overload at time of admission.  Hold torsemide  today, can resume 9/15.  Continue Toprol -XL.   Paroxysmal atrial fibrillation CHB s/p PPM: Paced rhythm.  Continue Toprol -XL and apixaban .   Insulin -dependent type 2 diabetes with hyperglycemia: CBG improved.  Hemoglobin A1c 9.1   Hypertension: Continue Toprol -XL and hydralazine .   Cognitive impairment   Disposition: return to ALF Diet recommendation:  Regular diet DISCHARGE MEDICATION: Allergies as of 01/27/2024       Reactions   Oysters [shellfish Allergy] Hives, Swelling   Citrus Hives   Pecan Extract Hives   Cabbage Other (See Comments)   Reaction?   Lemon Oil Hives   Walnut Other (See Comments)   Reaction?   Apple Juice Other (See Comments)   Sore throat   Farxiga  [dapagliflozin ] Other (See Comments)   Low blood sugar        Medication List     PAUSE taking these medications    nitrofurantoin (macrocrystal-monohydrate) 100 MG capsule Wait to take this until: January 31, 2024 Commonly known as: MACROBID Take 100 mg by mouth 2 (two) times daily.   torsemide  20 MG tablet Wait to take this until: January 29, 2024 Commonly known as: DEMADEX  Take 1 tablet (20 mg total) by mouth daily. Give extra dose for weight gain of 3lb in 1 day or 5lb in 1 week What changed:  when to take this additional instructions       STOP taking these medications    HumuLIN  70/30 KwikPen (70-30) 100 UNIT/ML KwikPen Generic drug: insulin  isophane & regular human KwikPen       TAKE these medications    acetaminophen  325 MG tablet Commonly known  as: TYLENOL  Take 650 mg by mouth every 8 (eight) hours as needed (for pain).   allopurinol  100 MG tablet Commonly known as: ZYLOPRIM  TAKE 1 TABLET(100 MG) BY MOUTH DAILY   apixaban  2.5 MG Tabs tablet Commonly known as: Eliquis  Take 1 tablet (2.5 mg  total) by mouth 2 (two) times daily.   calcitRIOL  0.25 MCG capsule Commonly known as: ROCALTROL  TAKE 1 CAPSULE BY MOUTH DAILY RETURN IN ABOUT 6 MONTHS(AROUND 02/04/2023) What changed: See the new instructions.   cefadroxil  500 MG capsule Commonly known as: DURICEF Take 2 capsules (1,000 mg total) by mouth daily. Start taking on: January 28, 2024   cyanocobalamin  1000 MCG tablet Take 1 tablet (1,000 mcg total) by mouth daily.   Dexcom G7 Sensor Misc 1 Act by Does not apply route daily. What changed:  how much to take how to take this when to take this additional instructions   feeding supplement (GLUCERNA SHAKE) Liqd Take 237 mLs by mouth daily. What changed:  when to take this additional instructions   ferrous sulfate  325 (65 FE) MG tablet Take 1 tablet (325 mg total) by mouth daily with breakfast.   folic acid  1 MG tablet Commonly known as: FOLVITE  Take 1 tablet (1 mg total) by mouth daily.   Gvoke HypoPen  2-Pack 1 MG/0.2ML Soaj Generic drug: Glucagon  Inject 1 Act into the skin daily as needed.   hydrALAZINE  10 MG tablet Commonly known as: APRESOLINE  Take 1 tablet (10 mg total) by mouth 3 (three) times daily.   INSTA-GLUCOSE PO Take 1 Tube by mouth as needed (for a BGL less than 60 and drink orange juice beforehand, if possible).   insulin  aspart 100 UNIT/ML FlexPen Commonly known as: NOVOLOG  Inject 0-6 Units into the skin See admin instructions. Inject 0-6 units into the skin three times a day before meals, per sliding scale: BGL 0-150 = give nothing; 151-190 = 1 unit; 191-230 = 2 units; 231-270 = 3 units; 271-310 = 4 units; 311-350 = 5 units; 351-399 = 6 units; >400 = CALL MD!   Lantus  SoloStar 100 UNIT/ML Solostar Pen Generic drug: insulin  glargine Inject 8 Units into the skin at bedtime.   levocetirizine 5 MG tablet Commonly known as: XYZAL  Take 1 tablet (5 mg total) by mouth every evening.   loratadine  10 MG tablet Commonly known as: CLARITIN  Take  10 mg by mouth daily as needed for allergies.   metoprolol  succinate 25 MG 24 hr tablet Commonly known as: TOPROL -XL Take 1 tablet (25 mg total) by mouth daily.   tamsulosin  0.4 MG Caps capsule Commonly known as: FLOMAX  Take 1 capsule (0.4 mg total) by mouth daily.   Vitamin D3 1000 units Caps Take 1,000 Units by mouth in the morning.        Follow-up Information     [name], MD Follow up.   Specialty: Internal Medicine Why: As needed Contact information: [address] [place] [address] [phone]                Discharge Exam: Fredricka Weights   01/26/24 0620 01/27/24 0500  Weight: 78.3 kg 81.1 kg   Physical Exam Vitals reviewed.  Constitutional:      General: He is not in acute distress.    Appearance: He is not ill-appearing or toxic-appearing.  Cardiovascular:     Rate and Rhythm: Normal rate and regular rhythm.     Heart sounds: No murmur heard. Pulmonary:     Effort: Pulmonary effort is normal.  No respiratory distress.     Breath sounds: No wheezing, rhonchi or rales.  Neurological:     Mental Status: He is alert.  Psychiatric:        Mood and Affect: Mood normal.        Behavior: Behavior normal.      Condition at discharge: good  The results of significant diagnostics from this hospitalization (including imaging, microbiology, ancillary and laboratory) are listed below for reference.   Imaging Studies: CT CHEST ABDOMEN PELVIS WO CONTRAST Result Date: 01/25/2024 CLINICAL DATA:  Sepsis. EXAM: CT CHEST, ABDOMEN AND PELVIS WITHOUT CONTRAST TECHNIQUE: Multidetector CT imaging of the chest, abdomen and pelvis was performed following the standard protocol without IV contrast. RADIATION DOSE REDUCTION: This exam was performed according to the departmental dose-optimization program which includes automated exposure control, adjustment of the mA and/or kV according to patient size and/or use of iterative reconstruction technique.  COMPARISON:  CT dated 10/14/2023. FINDINGS: Evaluation of this exam is limited in the absence of intravenous contrast as well as due to streak artifact caused by right hip arthroplasty. CT CHEST FINDINGS Cardiovascular: There is no cardiomegaly or pericardial effusion. There is 3 vessel coronary vascular calcification. Left pectoral pacemaker device. Mild atherosclerotic calcification of the thoracic aorta. No aneurysmal dilatation. The central pulmonary arteries are grossly unremarkable. Mediastinum/Nodes: No hilar or mediastinal adenopathy. The esophagus is grossly unremarkable. No mediastinal fluid collection. Lungs/Pleura: Focal faint ground-glass subpleural density in the anterior left upper lobe (34/6) new since the prior CT and may represent an area of atelectasis or infiltrate. No consolidative changes. There is no pleural effusion pneumothorax. The central airways are patent. Musculoskeletal: No acute osseous pathology. Indeterminate 1.5 x 3.0 cm nodular density in the subcutaneous soft tissues of the left breast (30/2) was present on the prior CT. This is not characterized but favor to represent an area of fat contusion. Correlation with clinical exam and history of trauma recommended. Other etiologies are not excluded. Ultrasound may provide better evaluation. CT ABDOMEN PELVIS FINDINGS No intra-abdominal free air or free fluid. Hepatobiliary: The liver is unremarkable. No biliary dilatation. The gallbladder is poorly visualized and may be contracted or surgically absent. Faint 5 mm radiopaque focus in the region of the gallbladder (47/2) may represent a gallstone or a focus of calcification. Pancreas: Unremarkable. No pancreatic ductal dilatation or surrounding inflammatory changes. Spleen: Normal in size without focal abnormality. Adrenals/Urinary Tract: The adrenal glands unremarkable there is no hydronephrosis or nephrolithiasis on either side. The visualized ureters appear unremarkable. The urinary  bladder is mildly distended. There is diffuse thickened appearance of the bladder wall likely partly related to underdistention as well as due to chronic bladder outlet obstruction. Correlation with urinalysis recommended to exclude superimposed UTI. Evaluation of the bladder is limited due to streak artifact caused by right hip arthroplasty. Stomach/Bowel: There is no bowel obstruction or active inflammation. The appendix is normal. Vascular/Lymphatic: Mild aortoiliac atherosclerotic disease. The IVC is unremarkable. No portal venous gas. No adenopathy. Reproductive: Enlarged prostate gland measuring 6 cm in transverse axial diameter. Other: None Musculoskeletal: Osteopenia with degenerative changes of the spine. Total right hip arthroplasty. No acute osseous pathology. IMPRESSION: 1. Focal faint ground-glass subpleural density in the anterior left upper lobe may represent an area of atelectasis or infiltrate. 2. No acute intra-abdominal or pelvic pathology. 3. Enlarged prostate gland with findings of chronic bladder outlet obstruction. Correlation with urinalysis recommended to exclude superimposed UTI. 4. No bowel obstruction. Normal appendix. 5.  Aortic Atherosclerosis (ICD10-I70.0). Electronically  Signed   By: Arash  Radparvar M.D.   On: 01/25/2024 18:08   DG Chest Port 1 View Result Date: 01/25/2024 CLINICAL DATA:  Altered mental status EXAM: PORTABLE CHEST 1 VIEW COMPARISON:  Chest x-ray 12/26/2023 FINDINGS: The heart is mildly enlarged. Left-sided pacemaker is present. The lungs are clear. There is no pleural effusion or pneumothorax. No acute fractures are seen. IMPRESSION: Mild cardiomegaly. No acute pulmonary process. Electronically Signed   By: Greig Pique M.D.   On: 01/25/2024 15:45   CARDIAC CATHETERIZATION Result Date: 12/29/2023 Findings: RA = 9 RV = 41/11 PA = 43/28 (32) PCW = 16 Fick cardiac output/index = 7.4/3.6 Thermo CO/CI = 5.5/2.7 PVR = 2.2 Fick 2.9 TD Ao sat = 91% PA sat = 65%,  70%  High SVC sat = 68% PAPi = 1.7 Assessment: 1. Normal left-sided pressures and cardiac output 2. Mildly elevated R-sided pressures with decreased PAPi suggestive of predominant R-sided failure 3. No evidence of intracardiac shunting Plan/Discussion: Medical therapy. Toribio Fuel, MD 9:36 AM   Microbiology: Results for orders placed or performed during the hospital encounter of 01/25/24  Culture, blood (routine x 2)     Status: None (Preliminary result)   Collection Time: 01/25/24  3:39 PM   Specimen: BLOOD  Result Value Ref Range Status   Specimen Description   Final    BLOOD RIGHT ANTECUBITAL Performed at [facility], [address] [place], [place] [address]    Special Requests   Final    BOTTLES DRAWN AEROBIC AND ANAEROBIC Blood Culture adequate volume Performed at [facility], [address] [place], [place] [address]    Culture   Final    NO GROWTH 2 DAYS Performed at [facility], [address], [place], [place] [address]    Report Status PENDING  Incomplete  Urine Culture     Status: Abnormal   Collection Time: 01/25/24  4:03 PM   Specimen: Urine, Clean Catch  Result Value Ref Range Status   Specimen Description   Final    URINE, CLEAN CATCH Performed at [facility], [address], [place], [place] [address]    Special Requests   Final    NONE Performed at [facility], [address], [place], [place] [address]    Culture 10,000 COLONIES/mL PROTEUS MIRABILIS (A)  Final   Report Status 01/27/2024 FINAL  Final   Organism ID, Bacteria PROTEUS MIRABILIS (A)  Final      Susceptibility   Proteus mirabilis - MIC*    AMPICILLIN  <=2 SENSITIVE Sensitive     CEFAZOLIN  (URINE) Value in next row Sensitive      4 SENSITIVEThis is a modified FDA-approved test that has been validated and its performance characteristics determined by the reporting laboratory.  This laboratory is certified under  the Clinical Laboratory Improvement Amendments CLIA as qualified to perform high complexity clinical laboratory testing.    CEFEPIME  Value in next row Sensitive      4 SENSITIVEThis is a modified FDA-approved test that has been validated and its performance characteristics determined by the reporting laboratory.  This laboratory is certified under the Clinical Laboratory Improvement Amendments CLIA as qualified to perform high complexity clinical laboratory testing.    ERTAPENEM Value in next row Sensitive      4 SENSITIVEThis is a modified FDA-approved test that has been validated and its performance characteristics determined by the reporting laboratory.  This laboratory is certified under the Clinical  Laboratory Improvement Amendments CLIA as qualified to perform high complexity clinical laboratory testing.    CEFTRIAXONE  Value in next row Sensitive      4 SENSITIVEThis is a modified FDA-approved test that has been validated and its performance characteristics determined by the reporting laboratory.  This laboratory is certified under the Clinical Laboratory Improvement Amendments CLIA as qualified to perform high complexity clinical laboratory testing.    CIPROFLOXACIN  Value in next row Sensitive      4 SENSITIVEThis is a modified FDA-approved test that has been validated and its performance characteristics determined by the reporting laboratory.  This laboratory is certified under the Clinical Laboratory Improvement Amendments CLIA as qualified to perform high complexity clinical laboratory testing.    GENTAMICIN  Value in next row Sensitive      4 SENSITIVEThis is a modified FDA-approved test that has been validated and its performance characteristics determined by the reporting laboratory.  This laboratory is certified under the Clinical Laboratory Improvement Amendments CLIA as qualified to perform high complexity clinical laboratory testing.    NITROFURANTOIN Value in next row Resistant      4  SENSITIVEThis is a modified FDA-approved test that has been validated and its performance characteristics determined by the reporting laboratory.  This laboratory is certified under the Clinical Laboratory Improvement Amendments CLIA as qualified to perform high complexity clinical laboratory testing.    TRIMETH/SULFA Value in next row Sensitive      4 SENSITIVEThis is a modified FDA-approved test that has been validated and its performance characteristics determined by the reporting laboratory.  This laboratory is certified under the Clinical Laboratory Improvement Amendments CLIA as qualified to perform high complexity clinical laboratory testing.    AMPICILLIN /SULBACTAM Value in next row Sensitive      4 SENSITIVEThis is a modified FDA-approved test that has been validated and its performance characteristics determined by the reporting laboratory.  This laboratory is certified under the Clinical Laboratory Improvement Amendments CLIA as qualified to perform high complexity clinical laboratory testing.    PIP/TAZO Value in next row Sensitive ug/mL     <=4 SENSITIVEThis is a modified FDA-approved test that has been validated and its performance characteristics determined by the reporting laboratory.  This laboratory is certified under the Clinical Laboratory Improvement Amendments CLIA as qualified to perform high complexity clinical laboratory testing.    MEROPENEM Value in next row Sensitive      <=4 SENSITIVEThis is a modified FDA-approved test that has been validated and its performance characteristics determined by the reporting laboratory.  This laboratory is certified under the Clinical Laboratory Improvement Amendments CLIA as qualified to perform high complexity clinical laboratory testing.    * 10,000 COLONIES/mL PROTEUS MIRABILIS  Resp panel by RT-PCR (RSV, Flu A&B, Covid) Anterior Nasal Swab     Status: None   Collection Time: 01/25/24  4:13 PM   Specimen: Anterior Nasal Swab  Result Value  Ref Range Status   SARS Coronavirus 2 by RT PCR NEGATIVE NEGATIVE Final    Comment: (NOTE) SARS-CoV-2 target nucleic acids are NOT DETECTED.  The SARS-CoV-2 RNA is generally detectable in upper respiratory specimens during the acute phase of infection. The lowest concentration of SARS-CoV-2 viral copies this assay can detect is 138 copies/mL. A negative result does not preclude SARS-Cov-2 infection and should not be used as the sole basis for treatment or other patient management decisions. A negative result may occur with  improper specimen collection/handling, submission of specimen other than nasopharyngeal swab, presence of viral mutation(s)  within the areas targeted by this assay, and inadequate number of viral copies(<138 copies/mL). A negative result must be combined with clinical observations, patient history, and epidemiological information. The expected result is Negative.  Fact Sheet for Patients:  BloggerCourse.com  Fact Sheet for Healthcare Providers:  SeriousBroker.it  This test is no t yet approved or cleared by the [place]  FDA and  has been authorized for detection and/or diagnosis of SARS-CoV-2 by FDA under an Emergency Use Authorization (EUA). This EUA will remain  in effect (meaning this test can be used) for the duration of the COVID-19 declaration under Section 564(b)(1) of the Act, 21 U.S.C.section 360bbb-3(b)(1), unless the authorization is terminated  or revoked sooner.       Influenza A by PCR NEGATIVE NEGATIVE Final   Influenza B by PCR NEGATIVE NEGATIVE Final    Comment: (NOTE) The Xpert Xpress SARS-CoV-2/FLU/RSV plus assay is intended as an aid in the diagnosis of influenza from Nasopharyngeal swab specimens and should not be used as a sole basis for treatment. Nasal washings and aspirates are unacceptable for Xpert Xpress SARS-CoV-2/FLU/RSV testing.  Fact Sheet for  Patients: BloggerCourse.com  Fact Sheet for Healthcare Providers: SeriousBroker.it  This test is not yet approved or cleared by the [place]  FDA and has been authorized for detection and/or diagnosis of SARS-CoV-2 by FDA under an Emergency Use Authorization (EUA). This EUA will remain in effect (meaning this test can be used) for the duration of the COVID-19 declaration under Section 564(b)(1) of the Act, 21 U.S.C. section 360bbb-3(b)(1), unless the authorization is terminated or revoked.     Resp Syncytial Virus by PCR NEGATIVE NEGATIVE Final    Comment: (NOTE) Fact Sheet for Patients: BloggerCourse.com  Fact Sheet for Healthcare Providers: SeriousBroker.it  This test is not yet approved or cleared by the [place]  FDA and has been authorized for detection and/or diagnosis of SARS-CoV-2 by FDA under an Emergency Use Authorization (EUA). This EUA will remain in effect (meaning this test can be used) for the duration of the COVID-19 declaration under Section 564(b)(1) of the Act, 21 U.S.C. section 360bbb-3(b)(1), unless the authorization is terminated or revoked.  Performed at [facility], [address], [place], [place] [address]   Culture, blood (routine x 2)     Status: None (Preliminary result)   Collection Time: 01/25/24 11:06 PM   Specimen: BLOOD  Result Value Ref Range Status   Specimen Description   Final    BLOOD BLOOD LEFT ARM Performed at [facility], [address], [place], [place] [address]    Special Requests   Final    Blood Culture results may not be optimal due to an inadequate volume of blood received in culture bottles BOTTLES DRAWN AEROBIC AND ANAEROBIC Performed at [facility], [address], [place], [place] [address]    Culture   Final    NO GROWTH 1 DAY Performed at [facility], [address] [place], [place] [address]    Report Status PENDING  Incomplete    Labs: CBC: Recent Labs  Lab 01/25/24 1525 01/26/24 0437  WBC 11.3* 9.2  NEUTROABS 8.5*  --   HGB 14.2 13.7  HCT 45.0 43.7  MCV 92.0 94.2  PLT 231 205   Basic Metabolic Panel: Recent Labs  Lab 01/25/24 1525 01/26/24 0437 01/26/24 1545  NA 135 138 138  K 3.8 3.7 3.6  CL 94* 102 101  CO2 26 22 24   GLUCOSE  488* 337* 97  BUN 47* 42* 42*  CREATININE 3.08* 2.48* 2.33*  CALCIUM 9.8 9.1 9.1   Liver Function Tests: Recent Labs  Lab 01/25/24 1525  AST 26  ALT 10  ALKPHOS 115  BILITOT 0.7  PROT 7.2  ALBUMIN 3.6   CBG: Recent Labs  Lab 01/26/24 1121 01/26/24 1556 01/26/24 2222 01/27/24 0721 01/27/24 1129  GLUCAP 269* 89 367* 262* 151*    Discharge time spent: less than 30 minutes.  Signed: Toribio Door, MD [facility] 01/27/2024

## 2024-01-27 NOTE — Plan of Care (Signed)

## 2024-01-27 NOTE — Progress Notes (Signed)
 Mobility Specialist - Progress Note   01/27/24 1016  Mobility  Activity Pivoted/transferred from bed to chair  Level of Assistance Minimal assist, patient does 75% or more  Assistive Device Front wheel walker  Range of Motion/Exercises Active  Activity Response Tolerated well  Mobility Referral Yes  Mobility visit 1 Mobility  Mobility Specialist Start Time (ACUTE ONLY) 1000  Mobility Specialist Stop Time (ACUTE ONLY) 1016  Mobility Specialist Time Calculation (min) (ACUTE ONLY) 16 min   Pt was found in bed and agreeable to mobilize. Pt was found soiled and RN to room to assist with pericare. Pt required step by step cues for transfer to recliner chair. At EOS was left on recliner chair with all needs met. Call bell in reach and chair alarm on.   Erminio Leos,  Mobility Specialist Can be reached via Secure Chat

## 2024-01-29 DIAGNOSIS — R2681 Unsteadiness on feet: Secondary | ICD-10-CM | POA: Diagnosis not present

## 2024-01-29 DIAGNOSIS — G9341 Metabolic encephalopathy: Secondary | ICD-10-CM | POA: Diagnosis not present

## 2024-01-29 DIAGNOSIS — R41841 Cognitive communication deficit: Secondary | ICD-10-CM | POA: Diagnosis not present

## 2024-01-29 DIAGNOSIS — R1312 Dysphagia, oropharyngeal phase: Secondary | ICD-10-CM | POA: Diagnosis not present

## 2024-01-29 DIAGNOSIS — R278 Other lack of coordination: Secondary | ICD-10-CM | POA: Diagnosis not present

## 2024-01-30 DIAGNOSIS — R319 Hematuria, unspecified: Secondary | ICD-10-CM | POA: Diagnosis not present

## 2024-01-30 DIAGNOSIS — R1312 Dysphagia, oropharyngeal phase: Secondary | ICD-10-CM | POA: Diagnosis not present

## 2024-01-30 DIAGNOSIS — M62511 Muscle wasting and atrophy, not elsewhere classified, right shoulder: Secondary | ICD-10-CM | POA: Diagnosis not present

## 2024-01-30 DIAGNOSIS — N390 Urinary tract infection, site not specified: Secondary | ICD-10-CM | POA: Diagnosis not present

## 2024-01-30 DIAGNOSIS — Z794 Long term (current) use of insulin: Secondary | ICD-10-CM | POA: Diagnosis not present

## 2024-01-30 DIAGNOSIS — R41841 Cognitive communication deficit: Secondary | ICD-10-CM | POA: Diagnosis not present

## 2024-01-30 DIAGNOSIS — R296 Repeated falls: Secondary | ICD-10-CM | POA: Diagnosis not present

## 2024-01-30 DIAGNOSIS — N179 Acute kidney failure, unspecified: Secondary | ICD-10-CM | POA: Diagnosis not present

## 2024-01-30 DIAGNOSIS — I509 Heart failure, unspecified: Secondary | ICD-10-CM | POA: Diagnosis not present

## 2024-01-30 DIAGNOSIS — G9340 Encephalopathy, unspecified: Secondary | ICD-10-CM | POA: Diagnosis not present

## 2024-01-30 DIAGNOSIS — N1832 Chronic kidney disease, stage 3b: Secondary | ICD-10-CM | POA: Diagnosis not present

## 2024-01-30 DIAGNOSIS — R278 Other lack of coordination: Secondary | ICD-10-CM | POA: Diagnosis not present

## 2024-01-30 DIAGNOSIS — I130 Hypertensive heart and chronic kidney disease with heart failure and stage 1 through stage 4 chronic kidney disease, or unspecified chronic kidney disease: Secondary | ICD-10-CM | POA: Diagnosis not present

## 2024-01-30 DIAGNOSIS — G9341 Metabolic encephalopathy: Secondary | ICD-10-CM | POA: Diagnosis not present

## 2024-01-30 DIAGNOSIS — E1122 Type 2 diabetes mellitus with diabetic chronic kidney disease: Secondary | ICD-10-CM | POA: Diagnosis not present

## 2024-01-30 DIAGNOSIS — M62512 Muscle wasting and atrophy, not elsewhere classified, left shoulder: Secondary | ICD-10-CM | POA: Diagnosis not present

## 2024-01-30 LAB — CULTURE, BLOOD (ROUTINE X 2)
Culture: NO GROWTH
Special Requests: ADEQUATE

## 2024-01-31 ENCOUNTER — Telehealth: Payer: Self-pay | Admitting: Internal Medicine

## 2024-01-31 DIAGNOSIS — R296 Repeated falls: Secondary | ICD-10-CM | POA: Diagnosis not present

## 2024-01-31 DIAGNOSIS — M62512 Muscle wasting and atrophy, not elsewhere classified, left shoulder: Secondary | ICD-10-CM | POA: Diagnosis not present

## 2024-01-31 DIAGNOSIS — R278 Other lack of coordination: Secondary | ICD-10-CM | POA: Diagnosis not present

## 2024-01-31 DIAGNOSIS — M62511 Muscle wasting and atrophy, not elsewhere classified, right shoulder: Secondary | ICD-10-CM | POA: Diagnosis not present

## 2024-01-31 LAB — CULTURE, BLOOD (ROUTINE X 2): Culture: NO GROWTH

## 2024-01-31 NOTE — Telephone Encounter (Signed)
 Contacted Connor Dixon to schedule their annual wellness visit. Patient declined to schedule AWV at this time. Per son patient is now In [facility], and they have taking over his care.   [facility] Care Guide [facility] AWV TEAM Direct Dial: [phone]

## 2024-02-01 DIAGNOSIS — R2681 Unsteadiness on feet: Secondary | ICD-10-CM | POA: Diagnosis not present

## 2024-02-01 DIAGNOSIS — R278 Other lack of coordination: Secondary | ICD-10-CM | POA: Diagnosis not present

## 2024-02-02 DIAGNOSIS — R278 Other lack of coordination: Secondary | ICD-10-CM | POA: Diagnosis not present

## 2024-02-02 DIAGNOSIS — G9341 Metabolic encephalopathy: Secondary | ICD-10-CM | POA: Diagnosis not present

## 2024-02-02 DIAGNOSIS — R41841 Cognitive communication deficit: Secondary | ICD-10-CM | POA: Diagnosis not present

## 2024-02-02 DIAGNOSIS — R2681 Unsteadiness on feet: Secondary | ICD-10-CM | POA: Diagnosis not present

## 2024-02-02 DIAGNOSIS — R1312 Dysphagia, oropharyngeal phase: Secondary | ICD-10-CM | POA: Diagnosis not present

## 2024-02-06 DIAGNOSIS — I130 Hypertensive heart and chronic kidney disease with heart failure and stage 1 through stage 4 chronic kidney disease, or unspecified chronic kidney disease: Secondary | ICD-10-CM | POA: Diagnosis not present

## 2024-02-06 DIAGNOSIS — N390 Urinary tract infection, site not specified: Secondary | ICD-10-CM | POA: Diagnosis not present

## 2024-02-06 DIAGNOSIS — G9340 Encephalopathy, unspecified: Secondary | ICD-10-CM | POA: Diagnosis not present

## 2024-02-06 DIAGNOSIS — N1832 Chronic kidney disease, stage 3b: Secondary | ICD-10-CM | POA: Diagnosis not present

## 2024-02-06 DIAGNOSIS — M62512 Muscle wasting and atrophy, not elsewhere classified, left shoulder: Secondary | ICD-10-CM | POA: Diagnosis not present

## 2024-02-06 DIAGNOSIS — I509 Heart failure, unspecified: Secondary | ICD-10-CM | POA: Diagnosis not present

## 2024-02-06 DIAGNOSIS — R1312 Dysphagia, oropharyngeal phase: Secondary | ICD-10-CM | POA: Diagnosis not present

## 2024-02-06 DIAGNOSIS — N179 Acute kidney failure, unspecified: Secondary | ICD-10-CM | POA: Diagnosis not present

## 2024-02-06 DIAGNOSIS — R41841 Cognitive communication deficit: Secondary | ICD-10-CM | POA: Diagnosis not present

## 2024-02-06 DIAGNOSIS — E1122 Type 2 diabetes mellitus with diabetic chronic kidney disease: Secondary | ICD-10-CM | POA: Diagnosis not present

## 2024-02-06 DIAGNOSIS — R2681 Unsteadiness on feet: Secondary | ICD-10-CM | POA: Diagnosis not present

## 2024-02-06 DIAGNOSIS — R296 Repeated falls: Secondary | ICD-10-CM | POA: Diagnosis not present

## 2024-02-06 DIAGNOSIS — G9341 Metabolic encephalopathy: Secondary | ICD-10-CM | POA: Diagnosis not present

## 2024-02-06 DIAGNOSIS — R278 Other lack of coordination: Secondary | ICD-10-CM | POA: Diagnosis not present

## 2024-02-06 DIAGNOSIS — Z794 Long term (current) use of insulin: Secondary | ICD-10-CM | POA: Diagnosis not present

## 2024-02-06 DIAGNOSIS — M62511 Muscle wasting and atrophy, not elsewhere classified, right shoulder: Secondary | ICD-10-CM | POA: Diagnosis not present

## 2024-02-07 DIAGNOSIS — R296 Repeated falls: Secondary | ICD-10-CM | POA: Diagnosis not present

## 2024-02-07 DIAGNOSIS — M62512 Muscle wasting and atrophy, not elsewhere classified, left shoulder: Secondary | ICD-10-CM | POA: Diagnosis not present

## 2024-02-07 DIAGNOSIS — R2681 Unsteadiness on feet: Secondary | ICD-10-CM | POA: Diagnosis not present

## 2024-02-07 DIAGNOSIS — M62511 Muscle wasting and atrophy, not elsewhere classified, right shoulder: Secondary | ICD-10-CM | POA: Diagnosis not present

## 2024-02-07 DIAGNOSIS — R278 Other lack of coordination: Secondary | ICD-10-CM | POA: Diagnosis not present

## 2024-02-07 DIAGNOSIS — R059 Cough, unspecified: Secondary | ICD-10-CM | POA: Diagnosis not present

## 2024-02-08 DIAGNOSIS — R278 Other lack of coordination: Secondary | ICD-10-CM | POA: Diagnosis not present

## 2024-02-08 DIAGNOSIS — R2681 Unsteadiness on feet: Secondary | ICD-10-CM | POA: Diagnosis not present

## 2024-02-09 DIAGNOSIS — R41841 Cognitive communication deficit: Secondary | ICD-10-CM | POA: Diagnosis not present

## 2024-02-09 DIAGNOSIS — R1312 Dysphagia, oropharyngeal phase: Secondary | ICD-10-CM | POA: Diagnosis not present

## 2024-02-09 DIAGNOSIS — I10 Essential (primary) hypertension: Secondary | ICD-10-CM | POA: Diagnosis not present

## 2024-02-09 DIAGNOSIS — N184 Chronic kidney disease, stage 4 (severe): Secondary | ICD-10-CM | POA: Diagnosis not present

## 2024-02-09 DIAGNOSIS — M62512 Muscle wasting and atrophy, not elsewhere classified, left shoulder: Secondary | ICD-10-CM | POA: Diagnosis not present

## 2024-02-09 DIAGNOSIS — G9341 Metabolic encephalopathy: Secondary | ICD-10-CM | POA: Diagnosis not present

## 2024-02-09 DIAGNOSIS — R278 Other lack of coordination: Secondary | ICD-10-CM | POA: Diagnosis not present

## 2024-02-09 DIAGNOSIS — R296 Repeated falls: Secondary | ICD-10-CM | POA: Diagnosis not present

## 2024-02-09 DIAGNOSIS — M62511 Muscle wasting and atrophy, not elsewhere classified, right shoulder: Secondary | ICD-10-CM | POA: Diagnosis not present

## 2024-02-12 ENCOUNTER — Telehealth: Payer: Self-pay | Admitting: Neurology

## 2024-02-12 DIAGNOSIS — R1312 Dysphagia, oropharyngeal phase: Secondary | ICD-10-CM | POA: Diagnosis not present

## 2024-02-12 DIAGNOSIS — G9341 Metabolic encephalopathy: Secondary | ICD-10-CM | POA: Diagnosis not present

## 2024-02-12 DIAGNOSIS — R278 Other lack of coordination: Secondary | ICD-10-CM | POA: Diagnosis not present

## 2024-02-12 DIAGNOSIS — M62512 Muscle wasting and atrophy, not elsewhere classified, left shoulder: Secondary | ICD-10-CM | POA: Diagnosis not present

## 2024-02-12 DIAGNOSIS — R2681 Unsteadiness on feet: Secondary | ICD-10-CM | POA: Diagnosis not present

## 2024-02-12 DIAGNOSIS — R41841 Cognitive communication deficit: Secondary | ICD-10-CM | POA: Diagnosis not present

## 2024-02-12 DIAGNOSIS — R296 Repeated falls: Secondary | ICD-10-CM | POA: Diagnosis not present

## 2024-02-12 DIAGNOSIS — M62511 Muscle wasting and atrophy, not elsewhere classified, right shoulder: Secondary | ICD-10-CM | POA: Diagnosis not present

## 2024-02-12 NOTE — Telephone Encounter (Signed)
 Pt's son called in and left a message with the after hours service on 02/10/24. Caller states his father has a follow up on 10/20. Caller states his father lives in an assisted living facility and is having changes in his behavior where he is aggressive, combative, and is having more confusion. Caller states his father also has paranoia and is fighting verbally. Caller states this has been going on since Tuesday 9/16. Caller states he was getting calls and is now with his father.   Full access nurse report is in Dr. Jayme box

## 2024-02-12 NOTE — Telephone Encounter (Signed)
 Left a message to see if patient can come in to see Camie on  02-16-24 at 7:45 she will needs 60 mins with patient. If not he will have to keep appt with Jaffe on 03-04-24

## 2024-02-13 DIAGNOSIS — R1312 Dysphagia, oropharyngeal phase: Secondary | ICD-10-CM | POA: Diagnosis not present

## 2024-02-13 DIAGNOSIS — R2689 Other abnormalities of gait and mobility: Secondary | ICD-10-CM | POA: Diagnosis not present

## 2024-02-13 DIAGNOSIS — Z741 Need for assistance with personal care: Secondary | ICD-10-CM | POA: Diagnosis not present

## 2024-02-13 DIAGNOSIS — E119 Type 2 diabetes mellitus without complications: Secondary | ICD-10-CM | POA: Diagnosis not present

## 2024-02-13 DIAGNOSIS — M109 Gout, unspecified: Secondary | ICD-10-CM | POA: Diagnosis not present

## 2024-02-13 DIAGNOSIS — M6259 Muscle wasting and atrophy, not elsewhere classified, multiple sites: Secondary | ICD-10-CM | POA: Diagnosis not present

## 2024-02-13 DIAGNOSIS — M6281 Muscle weakness (generalized): Secondary | ICD-10-CM | POA: Diagnosis not present

## 2024-02-13 DIAGNOSIS — R41841 Cognitive communication deficit: Secondary | ICD-10-CM | POA: Diagnosis not present

## 2024-02-13 DIAGNOSIS — G9341 Metabolic encephalopathy: Secondary | ICD-10-CM | POA: Diagnosis not present

## 2024-02-14 DIAGNOSIS — R278 Other lack of coordination: Secondary | ICD-10-CM | POA: Diagnosis not present

## 2024-02-14 DIAGNOSIS — R1312 Dysphagia, oropharyngeal phase: Secondary | ICD-10-CM | POA: Diagnosis not present

## 2024-02-14 DIAGNOSIS — G9341 Metabolic encephalopathy: Secondary | ICD-10-CM | POA: Diagnosis not present

## 2024-02-14 DIAGNOSIS — R2681 Unsteadiness on feet: Secondary | ICD-10-CM | POA: Diagnosis not present

## 2024-02-14 DIAGNOSIS — R41841 Cognitive communication deficit: Secondary | ICD-10-CM | POA: Diagnosis not present

## 2024-02-15 DIAGNOSIS — R296 Repeated falls: Secondary | ICD-10-CM | POA: Diagnosis not present

## 2024-02-15 DIAGNOSIS — M62511 Muscle wasting and atrophy, not elsewhere classified, right shoulder: Secondary | ICD-10-CM | POA: Diagnosis not present

## 2024-02-15 DIAGNOSIS — M62512 Muscle wasting and atrophy, not elsewhere classified, left shoulder: Secondary | ICD-10-CM | POA: Diagnosis not present

## 2024-02-15 DIAGNOSIS — R278 Other lack of coordination: Secondary | ICD-10-CM | POA: Diagnosis not present

## 2024-02-15 NOTE — Progress Notes (Incomplete)
 Assessment/Plan:     Connor Dixon is a very pleasant 89 y.o. year old RH male with a history of hypertension, hyperlipidemia, CKD, PAF on Eliquis  status post PPM, DM1, seen today for evaluation of memory loss. MoCA unable to be performed, MMSE is 0/30. Patient had recent acute encephalopathy and some causes have been ruled out, including acute stroke, infectious, autoimmune and paraneoplastic disease.  During last visit to our neurology office, given no preceding history of hallucinations and frequent falls, Lewy body dementia  was not suspected. MRI of the brain, personally reviewed, without acute intracranial abnormality, moderate atrophy and white matter disease bilaterally, bilateral hippocampal atrophy, remote white matter infarcts in the right corona radiata and remote lacunar infarct in the right lentiform nucleus. Patient needs assistance with ADLs. From the neurology standpoint, there is no indication for antidementia medications given advanced disease, as they are no longer therapeutic, and the risk of these medications outweighs the benefit of them.  Also discussed with his son the possibility of memory care as he needs 24/7 monitoring, and for social and cognitive stimulation.  No neurology follow-up is indicated.  Mood to be controlled by primary physician  Dementia of unclear etiology, concern for vascular and Alzheimer's etiology, with behavioral disturbance    Continue B12 supplementation. Continue vitamin D  supplement. Recommend 24/7 surveillance, recommend memory care for safety, cognitive and social stimulation. Recommend good control of cardiovascular risk factors.  Continue Eliquis . Continue to control mood as per PCP No follow-up is indicated  Subjective:    The patient is accompanied by his son  who supplements the history.    How long did patient have memory difficulties?  For the last 2-3 years, worse since May 2025 when he was admitted with unresponsiveness .   He is 50 percent of what he was in May . His speech is tangential . He gets fixated with his buttons on his jacket.-Services repeats oneself?  Repeats  Ora neptune, he may curse after May of this year Disoriented when walking into a room? Son denies.  Leaving objects in unusual places?   Denies.  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  Wandering behavior? Denies.   Any personality changes, or depression, anxiety?  Has moments of irritability, agitation. Hallucinations or paranoia? Endorsed paranoia feels that are trying to harm him, to puncture his heart  Seizures? Denies.    Any sleep changes?  Does not sleep well. Talks in his sleep, has chronic nightmares  No  known REM behavior or sleepwalking.   Sleep apnea? Never been tested but  he is snorer Any hygiene concerns?  Denies.   Independent of bathing and dressing?  Needs assistance getting dressed. Who is in charge of the medications? Facility  is in charge  Who is in charge of the finances? Sons in charge    Any changes in appetite?   Denies.when agitated he may refuse to eat. Drinks water     Patient have trouble swallowing?  After the episode he was eating pureed, now on mechanical food. Excessive chewing at times , not consistently.  Does the patient cook?  No   Any headaches?  Denies.   Any vision changes?  Denies.  Patient has a history of cataracts removed  Chronic back pain?  Denies.   Ambulates with difficulty?  Today is in the wheelchair, he is weaker than prior according to his son Stroke symptoms? He was on his recliner, with R disfigurement of the face. His son who  lives in [place] reported that he saw him he had R drooping, this was gone within 6 hours improved on its own .  He was worked up in the hospital, yielded negative results for acute stroke.  He is on Eliquis   He is at risk for falls. Any tremors?  Denies history of tremor disorders.   Any anosmia?  Denies.   Any incontinence of urine? Endorsed, uses diapers  Any  bowel dysfunction? Denies.      Patient lives at [facility]   History of heavy alcohol  intake? Denies.   History of heavy tobacco use? Denies.   Family history of dementia?    Denies. Does patient drive? No longer drives Retired school principal    MRI of the brain, 10/18/2023 personally reviewed, without acute intracranial abnormality, moderate atrophy and white matter disease bilaterally, bilateral hippocampal atrophy, remote white matter infarcts in the right corona radiata and remote lacunar infarct in the right lentiform nucleus.   Initial visit for acute encephalopathy 11/22/2023, Dr. Skeet, on 10/08/2023, patient's son went back home to [place]  after visiting his father for a few days.  His father was at baseline.  He lives alone independently and handles all of his daily activities.  He has a woman come by once a week to clean his home.  On 10/09/2023, he called her about bringing something over when she comes over the next day to clean.  On 5/27, she came to his home and found him soiled sitting in a chair slumped over and unresponsive.  He was admitted to [facility].  Presenting glucose was 176.  BP was 153/91.  His temperature was 99.4 F.  CBC revealed WBC 10.7 but otherwise no other signs of infection.  Electrolytes and hepatic panel were unremarkable.  BUN and Cr were 41 and 2.48 respectively, which is his baseline.  CT head and CTA head and neck were unremarkable.  Follow up MRI of brain revealed chronic small vessel ischemic changes and generalized volume loss, including hippocampal atrophy, but no acute findings.  Underwent LP which was negative for meningitis, encephalitis.  .  Blood work was also unremarkable for autoimmune or other infectious etiologies.  Prolonged EEG revealed moderate diffuse slowing but no evidence of seizure or epileptiform discharges.  Repeat MRI of brain with and without contrast on 6/3 again revealed no acute abnormalities or abnormal enhancement.  CT  chest/abdomen/pelvis negative for malignancy.  B12 was 270 and was started on supplementation.  Treated with dexamethaxone in setting of empiric meningitis treatment without improvement.  He was then treated with IVIG which patient initially showed slow improvement but then plateaued.  Exact cause for acute encephalopathy was not determined but underlying dementia was suspected.  Noted to have rigidity on exam so neurology questioned possible Lewy body dementia.     Since discharge from the hospital, he has been residing at a SNF, [facility].  Overall he is improved, but no significant improvement since leaving the hospital.  He has some days where he is confused but now has more days of lucidity.  He has been able to recognize his family.  He is eating puree foods.  He has not been hallucinating or has been agitated.  He now has a tremor in either hand.   At baseline, he has always been high-functioning and independent.  He is a retired Technical sales engineer and school principal.  His son did note that he exhibited some mild cognitive changes.  For example, for the previous couple  of months, he mentioned that he was starting to have trouble keeping up with the bills and may need to start getting help.  The day before his son went home, he was out driving his father and his father seemed a little confused giving directions.  Otherwise, he didn't exhibit any cognitive changes concerning to his family.  He had not been experiencing hallucinations or falls.      Workup: Imaging: 10/10/2023 CT HEAD WO:  1. No acute intracranial abnormality.  ASPECTS 10/10 2. Moderate atrophy and diffuse periventricular white matter disease bilaterally. 3. Remote lacunar infarct in the anterior right corona radiata. 10/10/2023 CTA HEAD & NECK:  1. No large vessel occlusion 2. No evidence of significant stenosis, aneurysmal dilatation, or dissection involving the arteries of the head and neck. 3. Atherosclerotic changes within the  cavernous internal carotid arteries bilaterally without significant stenosis through the ICA termini. 10/12/2023 MRI BRAIN WO:  1.  No acute intracranial abnormality by noncontrast MRI. 2. Signal changes compatible with chronic small vessel disease. Generalized appearing brain volume loss. 10/17/2023 MRI BRAIN W WO:  1. No acute intracranial abnormality. 2. Moderate atrophy and white matter disease bilaterally. 3. Remote white matter infarcts in the right corona radiata and a remote lacunar infarct in the right lentiform nucleus.   Labs: 10/13/2023 CSF:  cell count 2,  protein 57, glucose 191, gram stain with predominantly mononuclear WBC, negative culture, negative meningitis/encephalitis panel, [facility] autoimmune/paraneoplastic panel negative, no oligoclonal bands, IgG index 0.6, nonreactive VDRL 05-10/2023 Serum:  negative ANA, sed rate 30, elevated CRP 8.9, negative SSA/SSB abs, nonreactive RPR, negative HIV, SARS Coronavirus 2 PCR negative, influenza A/B PCR negative, beta-hydroxybutyric acid elevated 2.23, ammonia <13 (repeat 22), CK 102, ethanol negative, TSH 1.002, B1 107.9, B12 270, folate 4.5    Allergies  Allergen Reactions   Oysters [Shellfish Allergy] Hives and Swelling   Citrus Hives   Pecan Extract Hives   Cabbage Other (See Comments)    Reaction?   Lemon Oil Hives   Walnut Other (See Comments)    Reaction?   Apple Juice Other (See Comments)    Sore throat   Farxiga  [Dapagliflozin ] Other (See Comments)    Low blood sugar    Current Outpatient Medications  Medication Instructions   acetaminophen  (TYLENOL ) 650 mg, Every 8 hours PRN   allopurinol  (ZYLOPRIM ) 100 MG tablet TAKE 1 TABLET(100 MG) BY MOUTH DAILY   apixaban  (ELIQUIS ) 2.5 mg, Oral, 2 times daily   calcitRIOL  (ROCALTROL ) 0.25 MCG capsule TAKE 1 CAPSULE BY MOUTH DAILY RETURN IN ABOUT 6 MONTHS(AROUND 02/04/2023)   cefadroxil  (DURICEF) 1,000 mg, Oral, Daily   Continuous Glucose Sensor (DEXCOM G7 SENSOR) MISC 1  Act, Does not apply, Daily   cyanocobalamin  1,000 mcg, Oral, Daily   Dextrose , Diabetic Use, (INSTA-GLUCOSE PO) 1 Tube, As needed   feeding supplement, GLUCERNA SHAKE, (GLUCERNA SHAKE) LIQD 237 mLs, Oral, Every 24 hours   ferrous sulfate  325 mg, Oral, Daily with breakfast   folic acid  (FOLVITE ) 1 mg, Oral, Daily   Glucagon  (GVOKE HYPOPEN  2-PACK) 1 MG/0.2ML SOAJ 1 Act, Subcutaneous, Daily PRN   hydrALAZINE  (APRESOLINE ) 10 mg, Oral, 3 times daily   insulin  aspart (NOVOLOG ) 0-6 Units, See admin instructions   Lantus  SoloStar 8 Units, Daily at bedtime   levocetirizine (XYZAL ) 5 mg, Oral, Every evening   loratadine  (CLARITIN ) 10 mg, Daily PRN   metoprolol  succinate (TOPROL -XL) 25 mg, Oral, Daily   tamsulosin  (FLOMAX ) 0.4 mg, Oral, Daily   torsemide  (DEMADEX ) 20  mg, Oral, Daily, Give extra dose for weight gain of 3lb in 1 day or 5lb in 1 week   Vitamin D3 1,000 Units, Every morning     VITALS:   Vitals:   02/16/24 0746 02/16/24 0900  BP: (!) 82/45 130/80  Pulse: 82   Resp: 20   SpO2: 95%   Height: 5' 8 (1.727 m)            No data to display             02/16/2024   12:00 PM 01/09/2018    5:04 PM  MMSE - Mini Mental State Exam  Orientation to time 0 5  Orientation to Place 0 5  Registration 0 3  Attention/ Calculation 0 5  Recall 0 2  Language- name 2 objects 0 2  Language- repeat 0 1  Language- follow 3 step command 0 3  Language- read & follow direction 0 1  Write a sentence 0 1  Copy design 0 1  Total score 0 29     PHYSICAL EXAM was limited as the patient was unable to cooperate with commands, repeatedly saying O gosh, or I want to go.   HEENT:  Normocephalic, atraumatic. The mucous membranes are moist. The superficial temporal arteries are without ropiness or tenderness. Cardiovascular: Regular rate and rhythm. Lungs: Clear to auscultation bilaterally. Neck: There are no carotid bruits noted bilaterally. Orientation:  Alert and not oriented to person  (he thinks that his son is his brother), not to place and time . No aphasia or dysarthria, but speech is very tangential unintelligible. Fund of knowledge is reduced. Recent and remote memory impaired.  Attention and concentration are reduced.  Unable to name objects and unable to repeat phrases  Cranial nerves: There is good facial symmetry. Extraocular muscles are intact and visual fields appear full to confrontational testing.  Hearing appears intact to conversational tone.  Tone: Tone is good throughout.no cogwheeling noted Sensation: Sensation is intact to light touch and pinprick throughout.unable to test vibration or sensation, patient would not cooperate Coordination: The patient had difficulty with RAM's or FNF bilaterally or FTN, was not cooperative with commands  Motor: Strength unable to be tested due to cooperation issue. There are no fasciculations noted. DTR's: Deep tendon reflexes are 2/4 .  Plantar responses are downgoing bilaterally. Gait and Station: deferred in wheelchair.      Thank you for allowing us  the opportunity to participate in the care of this nice patient. Please do not hesitate to contact us  for any questions or concerns.   Total time spent on today's visit was 55 minutes dedicated to this patient today, preparing to see patient, examining the patient, ordering tests and/or medications and counseling the patient, documenting clinical information in the EHR or other health record, independently interpreting results and communicating results to the patient/family, discussing treatment and goals, answering patient's questions and coordinating care.  Cc:  [name], MD  Camie Sevin 02/16/2024 12:01 PM

## 2024-02-16 ENCOUNTER — Ambulatory Visit: Payer: Self-pay | Admitting: Physician Assistant

## 2024-02-16 ENCOUNTER — Encounter

## 2024-02-16 ENCOUNTER — Encounter: Payer: Self-pay | Admitting: Physician Assistant

## 2024-02-16 VITALS — BP 130/80 | HR 82 | Resp 20 | Ht 68.0 in

## 2024-02-16 DIAGNOSIS — R1312 Dysphagia, oropharyngeal phase: Secondary | ICD-10-CM | POA: Diagnosis not present

## 2024-02-16 DIAGNOSIS — F03C3 Unspecified dementia, severe, with mood disturbance: Secondary | ICD-10-CM

## 2024-02-16 DIAGNOSIS — R278 Other lack of coordination: Secondary | ICD-10-CM | POA: Diagnosis not present

## 2024-02-16 DIAGNOSIS — R41841 Cognitive communication deficit: Secondary | ICD-10-CM | POA: Diagnosis not present

## 2024-02-16 DIAGNOSIS — G9341 Metabolic encephalopathy: Secondary | ICD-10-CM | POA: Diagnosis not present

## 2024-02-19 DIAGNOSIS — R278 Other lack of coordination: Secondary | ICD-10-CM | POA: Diagnosis not present

## 2024-02-19 DIAGNOSIS — M62512 Muscle wasting and atrophy, not elsewhere classified, left shoulder: Secondary | ICD-10-CM | POA: Diagnosis not present

## 2024-02-19 DIAGNOSIS — R296 Repeated falls: Secondary | ICD-10-CM | POA: Diagnosis not present

## 2024-02-19 DIAGNOSIS — M62511 Muscle wasting and atrophy, not elsewhere classified, right shoulder: Secondary | ICD-10-CM | POA: Diagnosis not present

## 2024-02-20 DIAGNOSIS — G9340 Encephalopathy, unspecified: Secondary | ICD-10-CM | POA: Diagnosis not present

## 2024-02-20 DIAGNOSIS — G9341 Metabolic encephalopathy: Secondary | ICD-10-CM | POA: Diagnosis not present

## 2024-02-20 DIAGNOSIS — Z794 Long term (current) use of insulin: Secondary | ICD-10-CM | POA: Diagnosis not present

## 2024-02-20 DIAGNOSIS — R2681 Unsteadiness on feet: Secondary | ICD-10-CM | POA: Diagnosis not present

## 2024-02-20 DIAGNOSIS — E1165 Type 2 diabetes mellitus with hyperglycemia: Secondary | ICD-10-CM | POA: Diagnosis not present

## 2024-02-20 DIAGNOSIS — R41841 Cognitive communication deficit: Secondary | ICD-10-CM | POA: Diagnosis not present

## 2024-02-20 DIAGNOSIS — Z87440 Personal history of urinary (tract) infections: Secondary | ICD-10-CM | POA: Diagnosis not present

## 2024-02-20 DIAGNOSIS — R1312 Dysphagia, oropharyngeal phase: Secondary | ICD-10-CM | POA: Diagnosis not present

## 2024-02-20 DIAGNOSIS — R278 Other lack of coordination: Secondary | ICD-10-CM | POA: Diagnosis not present

## 2024-02-21 DIAGNOSIS — M62511 Muscle wasting and atrophy, not elsewhere classified, right shoulder: Secondary | ICD-10-CM | POA: Diagnosis not present

## 2024-02-21 DIAGNOSIS — R2681 Unsteadiness on feet: Secondary | ICD-10-CM | POA: Diagnosis not present

## 2024-02-21 DIAGNOSIS — R296 Repeated falls: Secondary | ICD-10-CM | POA: Diagnosis not present

## 2024-02-21 DIAGNOSIS — M62512 Muscle wasting and atrophy, not elsewhere classified, left shoulder: Secondary | ICD-10-CM | POA: Diagnosis not present

## 2024-02-21 DIAGNOSIS — R278 Other lack of coordination: Secondary | ICD-10-CM | POA: Diagnosis not present

## 2024-02-22 NOTE — Progress Notes (Signed)
 Remote PPM Transmission

## 2024-02-23 ENCOUNTER — Ambulatory Visit: Payer: Medicare PPO

## 2024-02-23 DIAGNOSIS — R278 Other lack of coordination: Secondary | ICD-10-CM | POA: Diagnosis not present

## 2024-02-23 DIAGNOSIS — I442 Atrioventricular block, complete: Secondary | ICD-10-CM

## 2024-02-23 LAB — CUP PACEART REMOTE DEVICE CHECK
Battery Remaining Longevity: 29 mo
Battery Remaining Percentage: 30 %
Battery Voltage: 2.95 V
Brady Statistic AP VP Percent: 2.2 %
Brady Statistic AP VS Percent: 1 %
Brady Statistic AS VP Percent: 97 %
Brady Statistic AS VS Percent: 1 %
Brady Statistic RA Percent Paced: 1.7 %
Brady Statistic RV Percent Paced: 99 %
Date Time Interrogation Session: 20251009020013
Implantable Lead Connection Status: 753985
Implantable Lead Connection Status: 753985
Implantable Lead Implant Date: 20111202
Implantable Lead Implant Date: 20111202
Implantable Lead Location: 753859
Implantable Lead Location: 753860
Implantable Pulse Generator Implant Date: 20200930
Lead Channel Impedance Value: 440 Ohm
Lead Channel Impedance Value: 480 Ohm
Lead Channel Pacing Threshold Amplitude: 0.75 V
Lead Channel Pacing Threshold Amplitude: 1.375 V
Lead Channel Pacing Threshold Pulse Width: 0.4 ms
Lead Channel Pacing Threshold Pulse Width: 0.8 ms
Lead Channel Sensing Intrinsic Amplitude: 1.7 mV
Lead Channel Sensing Intrinsic Amplitude: 11 mV
Lead Channel Setting Pacing Amplitude: 1.625
Lead Channel Setting Pacing Amplitude: 2 V
Lead Channel Setting Pacing Pulse Width: 0.8 ms
Lead Channel Setting Sensing Sensitivity: 4 mV
Pulse Gen Model: 2272
Pulse Gen Serial Number: 9156495

## 2024-02-26 DIAGNOSIS — M62512 Muscle wasting and atrophy, not elsewhere classified, left shoulder: Secondary | ICD-10-CM | POA: Diagnosis not present

## 2024-02-26 DIAGNOSIS — R296 Repeated falls: Secondary | ICD-10-CM | POA: Diagnosis not present

## 2024-02-26 DIAGNOSIS — M62511 Muscle wasting and atrophy, not elsewhere classified, right shoulder: Secondary | ICD-10-CM | POA: Diagnosis not present

## 2024-02-26 DIAGNOSIS — N184 Chronic kidney disease, stage 4 (severe): Secondary | ICD-10-CM | POA: Diagnosis not present

## 2024-02-26 DIAGNOSIS — R278 Other lack of coordination: Secondary | ICD-10-CM | POA: Diagnosis not present

## 2024-02-26 DIAGNOSIS — I10 Essential (primary) hypertension: Secondary | ICD-10-CM | POA: Diagnosis not present

## 2024-02-26 NOTE — Progress Notes (Signed)
 Remote PPM Transmission

## 2024-02-27 DIAGNOSIS — E1122 Type 2 diabetes mellitus with diabetic chronic kidney disease: Secondary | ICD-10-CM | POA: Diagnosis not present

## 2024-02-27 DIAGNOSIS — R1312 Dysphagia, oropharyngeal phase: Secondary | ICD-10-CM | POA: Diagnosis not present

## 2024-02-27 DIAGNOSIS — Z7901 Long term (current) use of anticoagulants: Secondary | ICD-10-CM | POA: Diagnosis not present

## 2024-02-27 DIAGNOSIS — N184 Chronic kidney disease, stage 4 (severe): Secondary | ICD-10-CM | POA: Diagnosis not present

## 2024-02-27 DIAGNOSIS — R278 Other lack of coordination: Secondary | ICD-10-CM | POA: Diagnosis not present

## 2024-02-27 DIAGNOSIS — Z794 Long term (current) use of insulin: Secondary | ICD-10-CM | POA: Diagnosis not present

## 2024-02-27 DIAGNOSIS — M109 Gout, unspecified: Secondary | ICD-10-CM | POA: Diagnosis not present

## 2024-02-27 DIAGNOSIS — I4891 Unspecified atrial fibrillation: Secondary | ICD-10-CM | POA: Diagnosis not present

## 2024-02-27 DIAGNOSIS — G9341 Metabolic encephalopathy: Secondary | ICD-10-CM | POA: Diagnosis not present

## 2024-02-27 DIAGNOSIS — R2681 Unsteadiness on feet: Secondary | ICD-10-CM | POA: Diagnosis not present

## 2024-02-27 DIAGNOSIS — R41841 Cognitive communication deficit: Secondary | ICD-10-CM | POA: Diagnosis not present

## 2024-02-28 ENCOUNTER — Ambulatory Visit: Payer: Self-pay | Admitting: Internal Medicine

## 2024-02-28 DIAGNOSIS — R296 Repeated falls: Secondary | ICD-10-CM | POA: Diagnosis not present

## 2024-02-28 DIAGNOSIS — M62511 Muscle wasting and atrophy, not elsewhere classified, right shoulder: Secondary | ICD-10-CM | POA: Diagnosis not present

## 2024-02-28 DIAGNOSIS — R278 Other lack of coordination: Secondary | ICD-10-CM | POA: Diagnosis not present

## 2024-02-28 DIAGNOSIS — M62512 Muscle wasting and atrophy, not elsewhere classified, left shoulder: Secondary | ICD-10-CM | POA: Diagnosis not present

## 2024-02-29 DIAGNOSIS — R41841 Cognitive communication deficit: Secondary | ICD-10-CM | POA: Diagnosis not present

## 2024-02-29 DIAGNOSIS — R278 Other lack of coordination: Secondary | ICD-10-CM | POA: Diagnosis not present

## 2024-02-29 DIAGNOSIS — R1312 Dysphagia, oropharyngeal phase: Secondary | ICD-10-CM | POA: Diagnosis not present

## 2024-02-29 DIAGNOSIS — G9341 Metabolic encephalopathy: Secondary | ICD-10-CM | POA: Diagnosis not present

## 2024-02-29 DIAGNOSIS — R2681 Unsteadiness on feet: Secondary | ICD-10-CM | POA: Diagnosis not present

## 2024-03-01 ENCOUNTER — Encounter: Payer: Self-pay | Admitting: Pharmacist

## 2024-03-01 DIAGNOSIS — R278 Other lack of coordination: Secondary | ICD-10-CM | POA: Diagnosis not present

## 2024-03-01 DIAGNOSIS — G9341 Metabolic encephalopathy: Secondary | ICD-10-CM | POA: Diagnosis not present

## 2024-03-01 DIAGNOSIS — R1312 Dysphagia, oropharyngeal phase: Secondary | ICD-10-CM | POA: Diagnosis not present

## 2024-03-01 DIAGNOSIS — R2681 Unsteadiness on feet: Secondary | ICD-10-CM | POA: Diagnosis not present

## 2024-03-01 DIAGNOSIS — R41841 Cognitive communication deficit: Secondary | ICD-10-CM | POA: Diagnosis not present

## 2024-03-01 NOTE — Progress Notes (Signed)
 Pharmacy Quality Measure Review  This patient is appearing on a report for being at risk of failing the adherence measure for cholesterol (statin) medications this calendar year.   Medication: Pravastatin  Last fill date: 11/23/23 for 90 day supply  Medication was discontinued in June, will fail metric, no further action needed  Darrelyn Drum, PharmD, BCPS, CPP Clinical Pharmacist Practitioner [facility] [phone]

## 2024-03-04 ENCOUNTER — Ambulatory Visit: Admitting: Neurology

## 2024-03-04 DIAGNOSIS — M62511 Muscle wasting and atrophy, not elsewhere classified, right shoulder: Secondary | ICD-10-CM | POA: Diagnosis not present

## 2024-03-04 DIAGNOSIS — R278 Other lack of coordination: Secondary | ICD-10-CM | POA: Diagnosis not present

## 2024-03-04 DIAGNOSIS — M62512 Muscle wasting and atrophy, not elsewhere classified, left shoulder: Secondary | ICD-10-CM | POA: Diagnosis not present

## 2024-03-04 DIAGNOSIS — R296 Repeated falls: Secondary | ICD-10-CM | POA: Diagnosis not present

## 2024-03-05 DIAGNOSIS — R21 Rash and other nonspecific skin eruption: Secondary | ICD-10-CM | POA: Diagnosis not present

## 2024-03-05 DIAGNOSIS — G9341 Metabolic encephalopathy: Secondary | ICD-10-CM | POA: Diagnosis not present

## 2024-03-05 DIAGNOSIS — E1165 Type 2 diabetes mellitus with hyperglycemia: Secondary | ICD-10-CM | POA: Diagnosis not present

## 2024-03-05 DIAGNOSIS — R41841 Cognitive communication deficit: Secondary | ICD-10-CM | POA: Diagnosis not present

## 2024-03-05 DIAGNOSIS — R2681 Unsteadiness on feet: Secondary | ICD-10-CM | POA: Diagnosis not present

## 2024-03-05 DIAGNOSIS — R1312 Dysphagia, oropharyngeal phase: Secondary | ICD-10-CM | POA: Diagnosis not present

## 2024-03-05 DIAGNOSIS — R278 Other lack of coordination: Secondary | ICD-10-CM | POA: Diagnosis not present

## 2024-03-05 DIAGNOSIS — Z794 Long term (current) use of insulin: Secondary | ICD-10-CM | POA: Diagnosis not present

## 2024-03-06 DIAGNOSIS — M62512 Muscle wasting and atrophy, not elsewhere classified, left shoulder: Secondary | ICD-10-CM | POA: Diagnosis not present

## 2024-03-06 DIAGNOSIS — R278 Other lack of coordination: Secondary | ICD-10-CM | POA: Diagnosis not present

## 2024-03-06 DIAGNOSIS — R41841 Cognitive communication deficit: Secondary | ICD-10-CM | POA: Diagnosis not present

## 2024-03-06 DIAGNOSIS — M62511 Muscle wasting and atrophy, not elsewhere classified, right shoulder: Secondary | ICD-10-CM | POA: Diagnosis not present

## 2024-03-06 DIAGNOSIS — G9341 Metabolic encephalopathy: Secondary | ICD-10-CM | POA: Diagnosis not present

## 2024-03-06 DIAGNOSIS — R296 Repeated falls: Secondary | ICD-10-CM | POA: Diagnosis not present

## 2024-03-06 DIAGNOSIS — R1312 Dysphagia, oropharyngeal phase: Secondary | ICD-10-CM | POA: Diagnosis not present

## 2024-03-07 DIAGNOSIS — R278 Other lack of coordination: Secondary | ICD-10-CM | POA: Diagnosis not present

## 2024-03-07 DIAGNOSIS — R1312 Dysphagia, oropharyngeal phase: Secondary | ICD-10-CM | POA: Diagnosis not present

## 2024-03-07 DIAGNOSIS — R41841 Cognitive communication deficit: Secondary | ICD-10-CM | POA: Diagnosis not present

## 2024-03-07 DIAGNOSIS — R2681 Unsteadiness on feet: Secondary | ICD-10-CM | POA: Diagnosis not present

## 2024-03-07 DIAGNOSIS — G9341 Metabolic encephalopathy: Secondary | ICD-10-CM | POA: Diagnosis not present

## 2024-03-08 DIAGNOSIS — R278 Other lack of coordination: Secondary | ICD-10-CM | POA: Diagnosis not present

## 2024-03-08 DIAGNOSIS — R2681 Unsteadiness on feet: Secondary | ICD-10-CM | POA: Diagnosis not present

## 2024-03-12 DIAGNOSIS — M62511 Muscle wasting and atrophy, not elsewhere classified, right shoulder: Secondary | ICD-10-CM | POA: Diagnosis not present

## 2024-03-12 DIAGNOSIS — R296 Repeated falls: Secondary | ICD-10-CM | POA: Diagnosis not present

## 2024-03-12 DIAGNOSIS — M62512 Muscle wasting and atrophy, not elsewhere classified, left shoulder: Secondary | ICD-10-CM | POA: Diagnosis not present

## 2024-03-13 DIAGNOSIS — G9341 Metabolic encephalopathy: Secondary | ICD-10-CM | POA: Diagnosis not present

## 2024-03-14 DIAGNOSIS — M6281 Muscle weakness (generalized): Secondary | ICD-10-CM | POA: Diagnosis not present

## 2024-03-14 DIAGNOSIS — M109 Gout, unspecified: Secondary | ICD-10-CM | POA: Diagnosis not present

## 2024-03-14 DIAGNOSIS — M6259 Muscle wasting and atrophy, not elsewhere classified, multiple sites: Secondary | ICD-10-CM | POA: Diagnosis not present

## 2024-03-14 DIAGNOSIS — R2689 Other abnormalities of gait and mobility: Secondary | ICD-10-CM | POA: Diagnosis not present

## 2024-03-14 DIAGNOSIS — R41841 Cognitive communication deficit: Secondary | ICD-10-CM | POA: Diagnosis not present

## 2024-03-14 DIAGNOSIS — Z741 Need for assistance with personal care: Secondary | ICD-10-CM | POA: Diagnosis not present

## 2024-03-14 DIAGNOSIS — G9341 Metabolic encephalopathy: Secondary | ICD-10-CM | POA: Diagnosis not present

## 2024-03-14 DIAGNOSIS — R1312 Dysphagia, oropharyngeal phase: Secondary | ICD-10-CM | POA: Diagnosis not present

## 2024-03-14 DIAGNOSIS — E119 Type 2 diabetes mellitus without complications: Secondary | ICD-10-CM | POA: Diagnosis not present

## 2024-03-15 DIAGNOSIS — R41841 Cognitive communication deficit: Secondary | ICD-10-CM | POA: Diagnosis not present

## 2024-03-15 DIAGNOSIS — R296 Repeated falls: Secondary | ICD-10-CM | POA: Diagnosis not present

## 2024-03-15 DIAGNOSIS — M62511 Muscle wasting and atrophy, not elsewhere classified, right shoulder: Secondary | ICD-10-CM | POA: Diagnosis not present

## 2024-03-15 DIAGNOSIS — G9341 Metabolic encephalopathy: Secondary | ICD-10-CM | POA: Diagnosis not present

## 2024-03-15 DIAGNOSIS — M62512 Muscle wasting and atrophy, not elsewhere classified, left shoulder: Secondary | ICD-10-CM | POA: Diagnosis not present

## 2024-03-15 DIAGNOSIS — R1312 Dysphagia, oropharyngeal phase: Secondary | ICD-10-CM | POA: Diagnosis not present

## 2024-03-15 DIAGNOSIS — R278 Other lack of coordination: Secondary | ICD-10-CM | POA: Diagnosis not present

## 2024-03-15 DIAGNOSIS — R2681 Unsteadiness on feet: Secondary | ICD-10-CM | POA: Diagnosis not present

## 2024-03-18 DIAGNOSIS — M62511 Muscle wasting and atrophy, not elsewhere classified, right shoulder: Secondary | ICD-10-CM | POA: Diagnosis not present

## 2024-03-18 DIAGNOSIS — R278 Other lack of coordination: Secondary | ICD-10-CM | POA: Diagnosis not present

## 2024-03-18 DIAGNOSIS — R296 Repeated falls: Secondary | ICD-10-CM | POA: Diagnosis not present

## 2024-03-18 DIAGNOSIS — M62512 Muscle wasting and atrophy, not elsewhere classified, left shoulder: Secondary | ICD-10-CM | POA: Diagnosis not present

## 2024-03-20 DIAGNOSIS — G9341 Metabolic encephalopathy: Secondary | ICD-10-CM | POA: Diagnosis not present

## 2024-03-20 DIAGNOSIS — R41841 Cognitive communication deficit: Secondary | ICD-10-CM | POA: Diagnosis not present

## 2024-03-20 DIAGNOSIS — R1312 Dysphagia, oropharyngeal phase: Secondary | ICD-10-CM | POA: Diagnosis not present

## 2024-03-21 DIAGNOSIS — M62512 Muscle wasting and atrophy, not elsewhere classified, left shoulder: Secondary | ICD-10-CM | POA: Diagnosis not present

## 2024-03-21 DIAGNOSIS — R278 Other lack of coordination: Secondary | ICD-10-CM | POA: Diagnosis not present

## 2024-03-21 DIAGNOSIS — Z794 Long term (current) use of insulin: Secondary | ICD-10-CM | POA: Diagnosis not present

## 2024-03-21 DIAGNOSIS — E1165 Type 2 diabetes mellitus with hyperglycemia: Secondary | ICD-10-CM | POA: Diagnosis not present

## 2024-03-21 DIAGNOSIS — M62511 Muscle wasting and atrophy, not elsewhere classified, right shoulder: Secondary | ICD-10-CM | POA: Diagnosis not present

## 2024-03-21 DIAGNOSIS — R296 Repeated falls: Secondary | ICD-10-CM | POA: Diagnosis not present

## 2024-03-26 ENCOUNTER — Telehealth: Payer: Self-pay | Admitting: Internal Medicine

## 2024-03-26 ENCOUNTER — Telehealth (HOSPITAL_COMMUNITY): Payer: Self-pay | Admitting: Physician Assistant

## 2024-03-26 DIAGNOSIS — M62512 Muscle wasting and atrophy, not elsewhere classified, left shoulder: Secondary | ICD-10-CM | POA: Diagnosis not present

## 2024-03-26 DIAGNOSIS — I130 Hypertensive heart and chronic kidney disease with heart failure and stage 1 through stage 4 chronic kidney disease, or unspecified chronic kidney disease: Secondary | ICD-10-CM | POA: Diagnosis not present

## 2024-03-26 DIAGNOSIS — I480 Paroxysmal atrial fibrillation: Secondary | ICD-10-CM | POA: Diagnosis not present

## 2024-03-26 DIAGNOSIS — R278 Other lack of coordination: Secondary | ICD-10-CM | POA: Diagnosis not present

## 2024-03-26 DIAGNOSIS — R41841 Cognitive communication deficit: Secondary | ICD-10-CM | POA: Diagnosis not present

## 2024-03-26 DIAGNOSIS — G9341 Metabolic encephalopathy: Secondary | ICD-10-CM | POA: Diagnosis not present

## 2024-03-26 DIAGNOSIS — N184 Chronic kidney disease, stage 4 (severe): Secondary | ICD-10-CM | POA: Diagnosis not present

## 2024-03-26 DIAGNOSIS — M62511 Muscle wasting and atrophy, not elsewhere classified, right shoulder: Secondary | ICD-10-CM | POA: Diagnosis not present

## 2024-03-26 DIAGNOSIS — R296 Repeated falls: Secondary | ICD-10-CM | POA: Diagnosis not present

## 2024-03-26 DIAGNOSIS — R1312 Dysphagia, oropharyngeal phase: Secondary | ICD-10-CM | POA: Diagnosis not present

## 2024-03-26 DIAGNOSIS — Z7901 Long term (current) use of anticoagulants: Secondary | ICD-10-CM | POA: Diagnosis not present

## 2024-03-26 NOTE — Telephone Encounter (Signed)
 Patients Son cancelled scheduled echocardiogram for the reason below:  03/26/2024 12:19 PM Ab:[id number], LEAH  Cancel Rsn: Patient (per son, stephen, cancel appts as him coming in is more disruptive than anything.)   Order will be removed from the active echo WQ. Thank you

## 2024-03-26 NOTE — Telephone Encounter (Signed)
 Spoke to patient's son, [name], he cancelled the patients echo on 12/08 and f/u with Dr. Waddell on 12/10. He says that his father is now in an assisted living facility and coming in for appointments is becoming more disruptive than anything. He says that he has primary care doctors checking on him and if anything heart related that is concerning should come up then they will call to schedule, otherwise, he is to be managed at the living facility. Him, his brother [name], and Primary care have all decided that this is best for him going forward. If you have any questions you can give him a call. FYI.

## 2024-03-27 NOTE — Telephone Encounter (Signed)
 Followed up with patient's son [name] regarding whether or not they would like to continue with remote device checks on his father. [name] confirmed that they do want to continue with the remote device checks until notified otherwise  This RN thanked [name] for the clarification on the subject  Remote device checks will continue as scheduled every 91 days

## 2024-03-28 DIAGNOSIS — G9341 Metabolic encephalopathy: Secondary | ICD-10-CM | POA: Diagnosis not present

## 2024-03-28 DIAGNOSIS — R1312 Dysphagia, oropharyngeal phase: Secondary | ICD-10-CM | POA: Diagnosis not present

## 2024-03-28 DIAGNOSIS — R41841 Cognitive communication deficit: Secondary | ICD-10-CM | POA: Diagnosis not present

## 2024-04-14 DIAGNOSIS — Z741 Need for assistance with personal care: Secondary | ICD-10-CM | POA: Diagnosis not present

## 2024-04-14 DIAGNOSIS — R1312 Dysphagia, oropharyngeal phase: Secondary | ICD-10-CM | POA: Diagnosis not present

## 2024-04-14 DIAGNOSIS — E119 Type 2 diabetes mellitus without complications: Secondary | ICD-10-CM | POA: Diagnosis not present

## 2024-04-14 DIAGNOSIS — R41841 Cognitive communication deficit: Secondary | ICD-10-CM | POA: Diagnosis not present

## 2024-04-14 DIAGNOSIS — R2689 Other abnormalities of gait and mobility: Secondary | ICD-10-CM | POA: Diagnosis not present

## 2024-04-14 DIAGNOSIS — M6259 Muscle wasting and atrophy, not elsewhere classified, multiple sites: Secondary | ICD-10-CM | POA: Diagnosis not present

## 2024-04-14 DIAGNOSIS — M6281 Muscle weakness (generalized): Secondary | ICD-10-CM | POA: Diagnosis not present

## 2024-04-14 DIAGNOSIS — M109 Gout, unspecified: Secondary | ICD-10-CM | POA: Diagnosis not present

## 2024-04-16 ENCOUNTER — Other Ambulatory Visit (HOSPITAL_COMMUNITY)

## 2024-04-18 DIAGNOSIS — I5022 Chronic systolic (congestive) heart failure: Secondary | ICD-10-CM | POA: Diagnosis not present

## 2024-04-18 DIAGNOSIS — E1122 Type 2 diabetes mellitus with diabetic chronic kidney disease: Secondary | ICD-10-CM | POA: Diagnosis not present

## 2024-04-18 DIAGNOSIS — N184 Chronic kidney disease, stage 4 (severe): Secondary | ICD-10-CM | POA: Diagnosis not present

## 2024-04-22 ENCOUNTER — Other Ambulatory Visit (HOSPITAL_COMMUNITY)

## 2024-04-23 ENCOUNTER — Inpatient Hospital Stay (HOSPITAL_COMMUNITY)
Admission: EM | Admit: 2024-04-23 | Discharge: 2024-04-26 | DRG: 637 | Source: Skilled Nursing Facility | Attending: Internal Medicine | Admitting: Internal Medicine

## 2024-04-23 ENCOUNTER — Emergency Department (HOSPITAL_COMMUNITY)

## 2024-04-23 ENCOUNTER — Other Ambulatory Visit: Payer: Self-pay

## 2024-04-23 ENCOUNTER — Encounter (HOSPITAL_COMMUNITY): Payer: Self-pay

## 2024-04-23 ENCOUNTER — Inpatient Hospital Stay (HOSPITAL_COMMUNITY)

## 2024-04-23 DIAGNOSIS — R410 Disorientation, unspecified: Secondary | ICD-10-CM | POA: Diagnosis not present

## 2024-04-23 DIAGNOSIS — I6782 Cerebral ischemia: Secondary | ICD-10-CM | POA: Diagnosis not present

## 2024-04-23 DIAGNOSIS — N179 Acute kidney failure, unspecified: Secondary | ICD-10-CM | POA: Diagnosis not present

## 2024-04-23 DIAGNOSIS — R29810 Facial weakness: Secondary | ICD-10-CM | POA: Diagnosis not present

## 2024-04-23 DIAGNOSIS — R739 Hyperglycemia, unspecified: Secondary | ICD-10-CM | POA: Diagnosis not present

## 2024-04-23 DIAGNOSIS — K5641 Fecal impaction: Secondary | ICD-10-CM | POA: Diagnosis not present

## 2024-04-23 DIAGNOSIS — N184 Chronic kidney disease, stage 4 (severe): Secondary | ICD-10-CM | POA: Diagnosis not present

## 2024-04-23 DIAGNOSIS — Z96641 Presence of right artificial hip joint: Secondary | ICD-10-CM | POA: Diagnosis not present

## 2024-04-23 DIAGNOSIS — R0902 Hypoxemia: Secondary | ICD-10-CM | POA: Diagnosis not present

## 2024-04-23 DIAGNOSIS — E860 Dehydration: Secondary | ICD-10-CM

## 2024-04-23 DIAGNOSIS — R112 Nausea with vomiting, unspecified: Secondary | ICD-10-CM | POA: Diagnosis not present

## 2024-04-23 DIAGNOSIS — E1110 Type 2 diabetes mellitus with ketoacidosis without coma: Secondary | ICD-10-CM | POA: Diagnosis present

## 2024-04-23 DIAGNOSIS — G9341 Metabolic encephalopathy: Secondary | ICD-10-CM

## 2024-04-23 DIAGNOSIS — E1122 Type 2 diabetes mellitus with diabetic chronic kidney disease: Secondary | ICD-10-CM | POA: Diagnosis not present

## 2024-04-23 DIAGNOSIS — R918 Other nonspecific abnormal finding of lung field: Secondary | ICD-10-CM | POA: Diagnosis not present

## 2024-04-23 DIAGNOSIS — R531 Weakness: Secondary | ICD-10-CM | POA: Diagnosis not present

## 2024-04-23 DIAGNOSIS — N400 Enlarged prostate without lower urinary tract symptoms: Secondary | ICD-10-CM | POA: Diagnosis not present

## 2024-04-23 DIAGNOSIS — R34 Anuria and oliguria: Secondary | ICD-10-CM | POA: Diagnosis not present

## 2024-04-23 DIAGNOSIS — I959 Hypotension, unspecified: Secondary | ICD-10-CM | POA: Diagnosis not present

## 2024-04-23 DIAGNOSIS — R1111 Vomiting without nausea: Secondary | ICD-10-CM | POA: Diagnosis not present

## 2024-04-23 DIAGNOSIS — I672 Cerebral atherosclerosis: Secondary | ICD-10-CM | POA: Diagnosis not present

## 2024-04-23 DIAGNOSIS — E1310 Other specified diabetes mellitus with ketoacidosis without coma: Principal | ICD-10-CM

## 2024-04-23 DIAGNOSIS — R509 Fever, unspecified: Secondary | ICD-10-CM | POA: Diagnosis not present

## 2024-04-23 LAB — BASIC METABOLIC PANEL WITH GFR
Anion gap: 18 — ABNORMAL HIGH (ref 5–15)
Anion gap: 16 — ABNORMAL HIGH (ref 5–15)
BUN: 58 mg/dL — ABNORMAL HIGH (ref 8–23)
BUN: 59 mg/dL — ABNORMAL HIGH (ref 8–23)
CO2: 20 mmol/L — ABNORMAL LOW (ref 22–32)
CO2: 22 mmol/L (ref 22–32)
Calcium: 8.8 mg/dL — ABNORMAL LOW (ref 8.9–10.3)
Calcium: 9 mg/dL (ref 8.9–10.3)
Chloride: 98 mmol/L (ref 98–111)
Chloride: 100 mmol/L (ref 98–111)
Creatinine, Ser: 4.24 mg/dL — ABNORMAL HIGH (ref 0.61–1.24)
Creatinine, Ser: 3.86 mg/dL — ABNORMAL HIGH (ref 0.61–1.24)
GFR, Estimated: 13 mL/min — ABNORMAL LOW (ref >60)
GFR, Estimated: 14 mL/min — ABNORMAL LOW (ref >60)
Glucose, Bld: 538 mg/dL (ref 70–99)
Glucose, Bld: 186 mg/dL — ABNORMAL HIGH (ref 70–99)
Potassium: 4.2 mmol/L (ref 3.5–5.1)
Potassium: 4 mmol/L (ref 3.5–5.1)
Sodium: 136 mmol/L (ref 135–145)
Sodium: 138 mmol/L (ref 135–145)

## 2024-04-23 LAB — I-STAT VENOUS BLOOD GAS, ED
Acid-base deficit: 14 mmol/L — ABNORMAL HIGH (ref 0.0–2.0)
Bicarbonate: 13.9 mmol/L — ABNORMAL LOW (ref 20.0–28.0)
Calcium, Ion: 1.06 mmol/L — ABNORMAL LOW (ref 1.15–1.40)
HCT: 43 % (ref 39.0–52.0)
Hemoglobin: 14.6 g/dL (ref 13.0–17.0)
O2 Saturation: 71 %
Potassium: 5.3 mmol/L — ABNORMAL HIGH (ref 3.5–5.1)
Sodium: 129 mmol/L — ABNORMAL LOW (ref 135–145)
TCO2: 15 mmol/L — ABNORMAL LOW (ref 22–32)
pCO2, Ven: 36.9 mmHg — ABNORMAL LOW (ref 44–60)
pH, Ven: 7.183 — CL (ref 7.25–7.43)
pO2, Ven: 46 mmHg — ABNORMAL HIGH (ref 32–45)

## 2024-04-23 LAB — URINALYSIS, W/ REFLEX TO CULTURE (INFECTION SUSPECTED)
Bilirubin Urine: NEGATIVE
Glucose, UA: >=500 mg/dL — AB
Hgb urine dipstick: NEGATIVE
Ketones, ur: NEGATIVE mg/dL
Nitrite: NEGATIVE
Protein, ur: 100 mg/dL — AB
Specific Gravity, Urine: 1.008 (ref 1.005–1.030)
pH: 5 (ref 5.0–8.0)

## 2024-04-23 LAB — GLUCOSE, CAPILLARY
Glucose-Capillary: 141 mg/dL — ABNORMAL HIGH (ref 70–99)
Glucose-Capillary: 141 mg/dL — ABNORMAL HIGH (ref 70–99)
Glucose-Capillary: 144 mg/dL — ABNORMAL HIGH (ref 70–99)
Glucose-Capillary: 172 mg/dL — ABNORMAL HIGH (ref 70–99)
Glucose-Capillary: 239 mg/dL — ABNORMAL HIGH (ref 70–99)
Glucose-Capillary: 274 mg/dL — ABNORMAL HIGH (ref 70–99)
Glucose-Capillary: 296 mg/dL — ABNORMAL HIGH (ref 70–99)
Glucose-Capillary: 387 mg/dL — ABNORMAL HIGH (ref 70–99)
Glucose-Capillary: 463 mg/dL — ABNORMAL HIGH (ref 70–99)
Glucose-Capillary: 489 mg/dL — ABNORMAL HIGH (ref 70–99)
Glucose-Capillary: 515 mg/dL (ref 70–99)

## 2024-04-23 LAB — RESPIRATORY PANEL BY PCR

## 2024-04-23 LAB — CBG MONITORING, ED
Glucose-Capillary: >600 mg/dL (ref 70–99)
Glucose-Capillary: >600 mg/dL (ref 70–99)
Glucose-Capillary: >600 mg/dL (ref 70–99)
Glucose-Capillary: 589 mg/dL (ref 70–99)
Glucose-Capillary: 545 mg/dL (ref 70–99)
Glucose-Capillary: 562 mg/dL (ref 70–99)

## 2024-04-23 LAB — COMPREHENSIVE METABOLIC PANEL WITH GFR
ALT: 15 U/L (ref 0–44)
AST: 52 U/L — ABNORMAL HIGH (ref 15–41)
Albumin: 3.3 g/dL — ABNORMAL LOW (ref 3.5–5.0)
Alkaline Phosphatase: 62 U/L (ref 38–126)
Anion gap: 27 — ABNORMAL HIGH (ref 5–15)
BUN: 53 mg/dL — ABNORMAL HIGH (ref 8–23)
CO2: 16 mmol/L — ABNORMAL LOW (ref 22–32)
Calcium: 9.1 mg/dL (ref 8.9–10.3)
Chloride: 91 mmol/L — ABNORMAL LOW (ref 98–111)
Creatinine, Ser: 4.36 mg/dL — ABNORMAL HIGH (ref 0.61–1.24)
GFR, Estimated: 12 mL/min — ABNORMAL LOW (ref >60)
Glucose, Bld: 820 mg/dL (ref 70–99)
Potassium: 5.4 mmol/L — ABNORMAL HIGH (ref 3.5–5.1)
Sodium: 134 mmol/L — ABNORMAL LOW (ref 135–145)
Total Bilirubin: 2.9 mg/dL — ABNORMAL HIGH (ref 0.0–1.2)
Total Protein: 6.9 g/dL (ref 6.5–8.1)

## 2024-04-23 LAB — LIPID PANEL
Cholesterol: 193 mg/dL (ref 0–200)
HDL: 59 mg/dL (ref >40)
LDL Cholesterol: 122 mg/dL — ABNORMAL HIGH (ref 0–99)
Total CHOL/HDL Ratio: 3.3 ratio
Triglycerides: 62 mg/dL (ref <150)
VLDL: 12 mg/dL (ref 0–40)

## 2024-04-23 LAB — CBC WITH DIFFERENTIAL/PLATELET
Abs Immature Granulocytes: 0.05 K/uL (ref 0.00–0.07)
Basophils Absolute: 0 K/uL (ref 0.0–0.1)
Basophils Relative: 0 %
Eosinophils Absolute: 0 K/uL (ref 0.0–0.5)
Eosinophils Relative: 0 %
HCT: 41.2 % (ref 39.0–52.0)
Hemoglobin: 13.1 g/dL (ref 13.0–17.0)
Immature Granulocytes: 0 %
Lymphocytes Relative: 3 %
Lymphs Abs: 0.3 K/uL — ABNORMAL LOW (ref 0.7–4.0)
MCH: 31.3 pg (ref 26.0–34.0)
MCHC: 31.8 g/dL (ref 30.0–36.0)
MCV: 98.3 fL (ref 80.0–100.0)
Monocytes Absolute: 1 K/uL (ref 0.1–1.0)
Monocytes Relative: 8 %
Neutro Abs: 11.2 K/uL — ABNORMAL HIGH (ref 1.7–7.7)
Neutrophils Relative %: 89 %
Platelets: 257 K/uL (ref 150–400)
RBC: 4.19 MIL/uL — ABNORMAL LOW (ref 4.22–5.81)
RDW: 16.7 % — ABNORMAL HIGH (ref 11.5–15.5)
WBC: 12.6 K/uL — ABNORMAL HIGH (ref 4.0–10.5)
nRBC: 0 % (ref 0.0–0.2)

## 2024-04-23 LAB — LIPASE, BLOOD: Lipase: 26 U/L (ref 11–51)

## 2024-04-23 LAB — RESP PANEL BY RT-PCR (RSV, FLU A&B, COVID)  RVPGX2
Influenza A by PCR: NEGATIVE
Influenza B by PCR: NEGATIVE
Resp Syncytial Virus by PCR: NEGATIVE
SARS Coronavirus 2 by RT PCR: NEGATIVE

## 2024-04-23 LAB — CBC
HCT: 36.5 % — ABNORMAL LOW (ref 39.0–52.0)
Hemoglobin: 12.2 g/dL — ABNORMAL LOW (ref 13.0–17.0)
MCH: 31.4 pg (ref 26.0–34.0)
MCHC: 33.4 g/dL (ref 30.0–36.0)
MCV: 93.8 fL (ref 80.0–100.0)
Platelets: 212 K/uL (ref 150–400)
RBC: 3.89 MIL/uL — ABNORMAL LOW (ref 4.22–5.81)
RDW: 16.1 % — ABNORMAL HIGH (ref 11.5–15.5)
WBC: 9.9 K/uL (ref 4.0–10.5)
nRBC: 0.2 % (ref 0.0–0.2)

## 2024-04-23 LAB — I-STAT CHEM 8, ED
BUN: 50 mg/dL — ABNORMAL HIGH (ref 8–23)
Calcium, Ion: 1.07 mmol/L — ABNORMAL LOW (ref 1.15–1.40)
Chloride: 97 mmol/L — ABNORMAL LOW (ref 98–111)
Creatinine, Ser: 3.8 mg/dL — ABNORMAL HIGH (ref 0.61–1.24)
Glucose, Bld: >700 mg/dL (ref 70–99)
HCT: 44 % (ref 39.0–52.0)
Hemoglobin: 15 g/dL (ref 13.0–17.0)
Potassium: 5.3 mmol/L — ABNORMAL HIGH (ref 3.5–5.1)
Sodium: 131 mmol/L — ABNORMAL LOW (ref 135–145)
TCO2: 14 mmol/L — ABNORMAL LOW (ref 22–32)

## 2024-04-23 LAB — PROTIME-INR
INR: 1.1 (ref 0.8–1.2)
Prothrombin Time: 15 s (ref 11.4–15.2)

## 2024-04-23 LAB — LACTIC ACID, PLASMA
Lactic Acid, Venous: 4.9 mmol/L (ref 0.5–1.9)
Lactic Acid, Venous: 4 mmol/L (ref 0.5–1.9)

## 2024-04-23 LAB — BETA-HYDROXYBUTYRIC ACID
Beta-Hydroxybutyric Acid: >8 mmol/L — ABNORMAL HIGH (ref 0.05–0.27)
Beta-Hydroxybutyric Acid: 3.45 mmol/L — ABNORMAL HIGH (ref 0.05–0.27)
Beta-Hydroxybutyric Acid: 0.24 mmol/L (ref 0.05–0.27)

## 2024-04-23 LAB — I-STAT CG4 LACTIC ACID, ED
Lactic Acid, Venous: 7.6 mmol/L (ref 0.5–1.9)
Lactic Acid, Venous: 5 mmol/L (ref 0.5–1.9)

## 2024-04-23 LAB — MRSA NEXT GEN BY PCR, NASAL: MRSA by PCR Next Gen: NOT DETECTED

## 2024-04-23 LAB — AMMONIA: Ammonia: 22 umol/L (ref 9–35)

## 2024-04-23 MED ORDER — LACTATED RINGERS IV BOLUS (SEPSIS)
1000.0000 mL | Freq: Once | INTRAVENOUS | Status: AC
  Administered 2024-04-23: 1000 mL via INTRAVENOUS

## 2024-04-23 MED ORDER — DEXTROSE IN LACTATED RINGERS 5 % IV SOLN: INTRAVENOUS | Status: DC

## 2024-04-23 MED ORDER — HEPARIN SODIUM (PORCINE) 5000 UNIT/ML IJ SOLN
5000.0000 [IU] | Freq: Three times a day (TID) | INTRAMUSCULAR | Status: DC
  Administered 2024-04-23 – 2024-04-25 (×7): 5000 [IU] via SUBCUTANEOUS
  Filled 2024-04-23 (×7): qty 1

## 2024-04-23 MED ORDER — CHLORHEXIDINE GLUCONATE CLOTH 2 % EX PADS
6.0000 | MEDICATED_PAD | Freq: Every day | CUTANEOUS | Status: DC
  Administered 2024-04-23 – 2024-04-26 (×4): 6 via TOPICAL

## 2024-04-23 MED ORDER — TAMSULOSIN HCL 0.4 MG PO CAPS: 0.4000 mg | ORAL_CAPSULE | Freq: Every day | ORAL | Status: DC

## 2024-04-23 MED ORDER — LACTATED RINGERS IV BOLUS
1000.0000 mL | Freq: Once | INTRAVENOUS | Status: AC
  Administered 2024-04-23: 1000 mL via INTRAVENOUS

## 2024-04-23 MED ORDER — INSULIN REGULAR(HUMAN) IN NACL 100-0.9 UT/100ML-% IV SOLN
INTRAVENOUS | Status: DC
  Administered 2024-04-23: 6 [IU]/h via INTRAVENOUS
  Administered 2024-04-23: 8.1 [IU]/h via INTRAVENOUS
  Filled 2024-04-23 (×2): qty 100

## 2024-04-23 MED ORDER — ONDANSETRON HCL 4 MG/2ML IJ SOLN: 4.0000 mg | Freq: Four times a day (QID) | INTRAMUSCULAR | Status: DC | PRN

## 2024-04-23 MED ORDER — LACTATED RINGERS IV BOLUS
20.0000 mL/kg | Freq: Once | INTRAVENOUS | Status: AC
  Administered 2024-04-23: 1622 mL via INTRAVENOUS

## 2024-04-23 MED ORDER — DOCUSATE SODIUM 100 MG PO CAPS: 100.0000 mg | ORAL_CAPSULE | Freq: Two times a day (BID) | ORAL | Status: DC | PRN

## 2024-04-23 MED ORDER — FOLIC ACID 5 MG/ML IJ SOLN
1.0000 mg | Freq: Every day | INTRAMUSCULAR | Status: DC
  Administered 2024-04-23 – 2024-04-26 (×4): 1 mg via INTRAVENOUS
  Filled 2024-04-23 (×4): qty 0.2

## 2024-04-23 MED ORDER — PIPERACILLIN-TAZOBACTAM IN DEX 2-0.25 GM/50ML IV SOLN
2.2500 g | Freq: Three times a day (TID) | INTRAVENOUS | Status: DC
  Administered 2024-04-23 – 2024-04-24 (×3): 2.25 g via INTRAVENOUS
  Filled 2024-04-23 (×4): qty 50

## 2024-04-23 MED ORDER — APIXABAN 2.5 MG PO TABS: 2.5000 mg | ORAL_TABLET | Freq: Two times a day (BID) | ORAL | Status: DC

## 2024-04-23 MED ORDER — LACTATED RINGERS IV SOLN: INTRAVENOUS | Status: DC

## 2024-04-23 MED ORDER — SODIUM CHLORIDE 0.9 % IV SOLN
2.0000 g | Freq: Once | INTRAVENOUS | Status: AC
  Administered 2024-04-23: 2 g via INTRAVENOUS
  Filled 2024-04-23: qty 12.5

## 2024-04-23 MED ORDER — VANCOMYCIN HCL IN DEXTROSE 1-5 GM/200ML-% IV SOLN: 1000.0000 mg | Freq: Once | INTRAVENOUS | Status: DC

## 2024-04-23 MED ORDER — ORAL CARE MOUTH RINSE
15.0000 mL | OROMUCOSAL | Status: DC
  Administered 2024-04-24 – 2024-04-26 (×9): 15 mL via OROMUCOSAL

## 2024-04-23 MED ORDER — CALCITRIOL 0.25 MCG PO CAPS: 0.2500 ug | ORAL_CAPSULE | Freq: Every morning | ORAL | Status: DC

## 2024-04-23 MED ORDER — FOLIC ACID 1 MG PO TABS: 1.0000 mg | ORAL_TABLET | Freq: Every day | ORAL | Status: DC

## 2024-04-23 MED ORDER — ORAL CARE MOUTH RINSE: 15.0000 mL | OROMUCOSAL | Status: DC | PRN

## 2024-04-23 MED ORDER — VANCOMYCIN VARIABLE DOSE PER UNSTABLE RENAL FUNCTION (PHARMACIST DOSING): Status: DC

## 2024-04-23 MED ORDER — DEXTROSE 50 % IV SOLN: 0.0000 mL | INTRAVENOUS | Status: DC | PRN

## 2024-04-23 MED ORDER — POLYETHYLENE GLYCOL 3350 17 G PO PACK: 17.0000 g | PACK | Freq: Every day | ORAL | Status: DC | PRN

## 2024-04-23 MED ORDER — VANCOMYCIN HCL 2000 MG/400ML IV SOLN
2000.0000 mg | Freq: Once | INTRAVENOUS | Status: AC
  Administered 2024-04-23: 2000 mg via INTRAVENOUS
  Filled 2024-04-23: qty 400

## 2024-04-23 NOTE — Sepsis Progress Note (Signed)
 Sepsis protocol monitored by eLink

## 2024-04-23 NOTE — H&P (Signed)
See consult note from same day.

## 2024-04-23 NOTE — Plan of Care (Signed)
  Problem: Safety: Goal: Non-violent Restraint(s) Outcome: Progressing   Problem: Clinical Measurements: Goal: Ability to maintain clinical measurements within normal limits will improve Outcome: Progressing   Problem: Pain Managment: Goal: General experience of comfort will improve and/or be controlled Outcome: Progressing   Problem: Education: Goal: Knowledge of General Education information will improve Description: Including pain rating scale, medication(s)/side effects and non-pharmacologic comfort measures Outcome: Not Progressing   Problem: Nutrition: Goal: Adequate nutrition will be maintained Outcome: Not Progressing

## 2024-04-23 NOTE — ED Provider Notes (Signed)
 [place] EMERGENCY DEPARTMENT AT [facility] Provider Note  CSN: [id number] Arrival date & time: 04/23/24 1027  Chief Complaint(s) No chief complaint on file.  HPI Connor Dixon is a 89 y.o. male brought in from [place]  due to altered mental status.  For EMS, patient was hypotensive, confused.  Patient does have a history of mild dementia, atrial fibrillation, pacemaker, diabetes, urinary retention.  EMS reports patient's blood sugar was high.   Past Medical History Past Medical History:  Diagnosis Date   Acute on chronic diastolic congestive heart failure (HCC)    Arthus phenomenon    Atrioventricular block, complete (HCC)    Cardiac pacemaker in situ 07/22/2010   Chronic kidney disease, stage 3 (HCC)    Presumably from longstanding DM & HTN. Scr has gradually increased over the past 2 years (1.6 in 12/2010, 2 in 03/2011, and up to 2.1 in 01/2013)   DIABETES MELLITUS, TYPE I, ADULT ONSET 08/06/2007   DM (diabetes mellitus) (HCC)    adult onset    Essential hypertension, benign 08/06/2007   Very good BP control   History of second degree heart block    HLD (hyperlipidemia)    HYPERLIPIDEMIA 08/06/2007   Hypopotassemia    Morbid obesity (HCC)    Obesity    Other and unspecified angina pectoris    typical   Type I (juvenile type) diabetes mellitus without mention of complication, not stated as uncontrolled    (04/25/13 OV with Dr. Rayburn) = Poorly controlled & is working  w/Dr. Karlos to improve this. Most recent Hgb A1c was 8.2% but Mr. Pirro reports it was down to 7.6 last month.   Patient Active Problem List   Diagnosis Date Noted   Diabetic ketoacidosis (HCC) 04/23/2024   Chronic heart failure with reduced ejection fraction (HFrEF, <= 40%) (HCC) 01/25/2024   Acute cystitis with hematuria 01/25/2024   Acute exacerbation of CHF (congestive heart failure) (HCC) 12/27/2023   Acute on chronic heart failure with preserved ejection fraction (HFpEF) (HCC)  12/26/2023   Acute kidney injury superimposed on chronic kidney disease 12/26/2023   Heart block AV complete (HCC) 12/26/2023   Palliative care by specialist 10/16/2023   Acute encephalopathy 10/10/2023   Elevated troponin 10/10/2023   Prolonged QT interval 10/10/2023   CKD stage 4 due to type 2 diabetes mellitus (HCC) 09/06/2023   Seasonal allergic rhinitis due to pollen 09/06/2023   Post-traumatic osteoarthritis of left shoulder 01/05/2023   Paroxysmal atrial fibrillation (HCC) 09/03/2021   Diabetic nephropathy associated with type 2 diabetes mellitus (HCC) 06/09/2021   Vitamin D  deficiency disease 06/09/2021   Insulin -requiring or dependent type II diabetes mellitus (HCC) 06/09/2021   Encounter for general adult medical examination with abnormal findings 06/09/2020   Sinus node dysfunction (HCC) 02/13/2019   Presence of permanent cardiac pacemaker 07/22/2010   Hyperlipidemia with target LDL less than 70 08/06/2007   Hypertension associated with diabetes (HCC) 08/06/2007   Home Medication(s) Prior to Admission medications   Medication Sig Start Date End Date Taking? Authorizing Provider  acetaminophen  (TYLENOL ) 325 MG tablet Take 650 mg by mouth every 8 (eight) hours as needed (for pain).    [provider]  allopurinol  (ZYLOPRIM ) 100 MG tablet TAKE 1 TABLET(100 MG) BY MOUTH DAILY 09/25/23   [name], MD  apixaban  (ELIQUIS ) 2.5 MG TABS tablet Take 1 tablet (2.5 mg total) by mouth 2 (two) times daily. 08/14/23   [name], MD  calcitRIOL  (ROCALTROL ) 0.25 MCG capsule TAKE  1 CAPSULE BY MOUTH DAILY RETURN IN ABOUT 6 MONTHS(AROUND 02/04/2023) Patient taking differently: Take 0.25 mcg by mouth in the morning. 08/14/23   [name], MD  cefadroxil  (DURICEF) 500 MG capsule Take 2 capsules (1,000 mg total) by mouth daily. 01/28/24   Jadine Toribio SQUIBB, MD  Cholecalciferol (VITAMIN D3) 1000 units CAPS Take 1,000 Units by mouth in the morning.    [provider]   Continuous Glucose Sensor (DEXCOM G7 SENSOR) MISC 1 Act by Does not apply route daily. Patient taking differently: Inject 1 Device into the skin See admin instructions. Place 1 new sensor into the skin every 10 days 01/06/23   [name], MD  cyanocobalamin  1000 MCG tablet Take 1 tablet (1,000 mcg total) by mouth daily. 11/08/23   Gherghe, Costin M, MD  Dextrose , Diabetic Use, (INSTA-GLUCOSE PO) Take 1 Tube by mouth as needed (for a BGL less than 60 and drink orange juice beforehand, if possible).    [provider]  feeding supplement, GLUCERNA SHAKE, (GLUCERNA SHAKE) LIQD Take 237 mLs by mouth daily. Patient taking differently: Take 237 mLs by mouth See admin instructions. Drink 237 ml's of VANILLA GLUCERNA in the morning 11/07/23   Gherghe, Costin M, MD  ferrous sulfate  325 (65 FE) MG tablet Take 1 tablet (325 mg total) by mouth daily with breakfast. 11/08/23   Gherghe, Costin M, MD  folic acid  (FOLVITE ) 1 MG tablet Take 1 tablet (1 mg total) by mouth daily. 11/08/23   Gherghe, Costin M, MD  Glucagon  (GVOKE HYPOPEN  2-PACK) 1 MG/0.2ML SOAJ Inject 1 Act into the skin daily as needed. 04/05/22   [name], MD  hydrALAZINE  (APRESOLINE ) 10 MG tablet Take 1 tablet (10 mg total) by mouth 3 (three) times daily. 12/29/23   Fairy Frames, MD  insulin  aspart (NOVOLOG ) 100 UNIT/ML FlexPen Inject 0-6 Units into the skin See admin instructions. Inject 0-6 units into the skin three times a day before meals, per sliding scale: BGL 0-150 = give nothing; 151-190 = 1 unit; 191-230 = 2 units; 231-270 = 3 units; 271-310 = 4 units; 311-350 = 5 units; 351-399 = 6 units; >400 = CALL MD!    [provider]  LANTUS  SOLOSTAR 100 UNIT/ML Solostar Pen Inject 8 Units into the skin at bedtime.    [provider]  levocetirizine (XYZAL ) 5 MG tablet Take 1 tablet (5 mg total) by mouth every evening. 09/06/23   [name], MD  loratadine  (CLARITIN ) 10 MG tablet Take 10 mg by mouth daily as  needed for allergies.    [provider]  metoprolol  succinate (TOPROL -XL) 25 MG 24 hr tablet Take 1 tablet (25 mg total) by mouth daily. 12/30/23   Fairy Frames, MD  tamsulosin  (FLOMAX ) 0.4 MG CAPS capsule Take 1 capsule (0.4 mg total) by mouth daily. 11/08/23   Gherghe, Costin M, MD  torsemide  (DEMADEX ) 20 MG tablet Take 1 tablet (20 mg total) by mouth daily. Give extra dose for weight gain of 3lb in 1 day or 5lb in 1 week Patient taking differently: Take 20 mg by mouth See admin instructions. Take 20 mg by mouth at 8 AM and an additional 20 mg (to equal a total dose of 40 mg) from 01/24/2024 through 01/28/2024 12/30/23   Fairy Frames, MD  Past Surgical History Past Surgical History:  Procedure Laterality Date   CATARACT EXTRACTION  8/08   [place]   PPM GENERATOR CHANGEOUT N/A 02/13/2019   Procedure: PPM GENERATOR CHANGEOUT;  Surgeon: Waddell Danelle ORN, MD;  Location: [facility] INVASIVE CV [facility];  Service: Cardiovascular;  Laterality: N/A;   PTVDP  12/11   PPM - [employer]   RIGHT HEART CATH N/A 12/29/2023   Procedure: RIGHT HEART CATH;  Surgeon: Cherrie Toribio SAUNDERS, MD;  Location: [facility] INVAS[facility] CV [facility];  Service: Cardiovascular;  Laterality: N/A;   TOTAL HIP ARTHROPLASTY  04/05/06   right   Family History Family History  Problem Relation Age of Onset   Asthma Father    Coronary artery disease Father    Diabetes Father    Hyperlipidemia Father    Stroke Father 70   Coronary artery disease Mother    Asthma Mother    Heart attack Mother 59   Colon cancer Neg Hx        no definate prostate CA     Social History Social History   Tobacco Use   Smoking status: Never   Smokeless tobacco: Never  Vaping Use   Vaping status: Never Used  Substance Use Topics   Alcohol  use: No   Drug use: No   Allergies Oysters [shellfish allergy], Citrus, Pecan  extract, Cabbage, Lemon oil, Walnut, Apple juice, and Farxiga  [dapagliflozin ]  Review of Systems Review of Systems  Physical Exam Vital Signs  I have reviewed the triage vital signs BP (!) 96/55   Pulse (!) 58   Temp 99.4 F (37.4 C) (Rectal)   Resp (!) 25   Wt 81.1 kg   SpO2 100%   BMI 27.19 kg/m   Physical Exam Vitals and nursing note reviewed.  Eyes:     Pupils: Pupils are equal, round, and reactive to light.  Cardiovascular:     Rate and Rhythm: Normal rate.  Pulmonary:     Effort: Pulmonary effort is normal.  Abdominal:     General: Abdomen is flat.  Musculoskeletal:        General: Normal range of motion.  Skin:    General: Skin is warm.  Neurological:     Mental Status: He is alert.     Comments: There is a facial droop present.  Moving all extremities spontaneously     ED Results and Treatments Labs (all labs ordered are listed, but only abnormal results are displayed) Labs Reviewed  COMPREHENSIVE METABOLIC PANEL WITH GFR - Abnormal; Notable for the following components:      Result Value   Sodium 134 (*)    Potassium 5.4 (*)    Chloride 91 (*)    CO2 16 (*)    Glucose, Bld 820 (*)    BUN 53 (*)    Creatinine, Ser 4.36 (*)    Albumin 3.3 (*)    AST 52 (*)    Total Bilirubin 2.9 (*)    GFR, Estimated 12 (*)    Anion gap 27 (*)    All other components within normal limits  CBC WITH DIFFERENTIAL/PLATELET - Abnormal; Notable for the following components:   WBC 12.6 (*)    RBC 4.19 (*)    RDW 16.7 (*)    Neutro Abs 11.2 (*)    Lymphs Abs 0.3 (*)    All other components within normal limits  URINALYSIS, W/ REFLEX TO CULTURE (INFECTION SUSPECTED) - Abnormal; Notable for the following components:   APPearance HAZY (*)  Glucose, UA >=500 (*)    Protein, ur 100 (*)    Leukocytes,Ua TRACE (*)    Bacteria, UA RARE (*)    All other components within normal limits  BETA-HYDROXYBUTYRIC ACID - Abnormal; Notable for the following components:    Beta-Hydroxybutyric Acid >8.00 (*)    All other components within normal limits  CBG MONITORING, ED - Abnormal; Notable for the following components:   Glucose-Capillary >600 (*)    All other components within normal limits  I-STAT CG4 LACTIC ACID, ED - Abnormal; Notable for the following components:   Lactic Acid, Venous 7.6 (*)    All other components within normal limits  I-STAT CHEM 8, ED - Abnormal; Notable for the following components:   Sodium 131 (*)    Potassium 5.3 (*)    Chloride 97 (*)    BUN 50 (*)    Creatinine, Ser 3.80 (*)    Glucose, Bld >700 (*)    Calcium, Ion 1.07 (*)    TCO2 14 (*)    All other components within normal limits  I-STAT VENOUS BLOOD GAS, ED - Abnormal; Notable for the following components:   pH, Ven 7.183 (*)    pCO2, Ven 36.9 (*)    pO2, Ven 46 (*)    Bicarbonate 13.9 (*)    TCO2 15 (*)    Acid-base deficit 14.0 (*)    Sodium 129 (*)    Potassium 5.3 (*)    Calcium, Ion 1.06 (*)    All other components within normal limits  I-STAT CG4 LACTIC ACID, ED - Abnormal; Notable for the following components:   Lactic Acid, Venous 5.0 (*)    All other components within normal limits  CBG MONITORING, ED - Abnormal; Notable for the following components:   Glucose-Capillary >600 (*)    All other components within normal limits  CBG MONITORING, ED - Abnormal; Notable for the following components:   Glucose-Capillary >600 (*)    All other components within normal limits  RESP PANEL BY RT-PCR (RSV, FLU A&B, COVID)  RVPGX2  CULTURE, BLOOD (ROUTINE X 2)  CULTURE, BLOOD (ROUTINE X 2)  MRSA NEXT GEN BY PCR, NASAL  PROTIME-INR  CBC  CREATININE, SERUM  LIPASE, BLOOD                                                                                                                          Radiology DG Chest Port 1 View Result Date: 04/23/2024 CLINICAL DATA:  Altered mental status.  Hypotensive. EXAM: PORTABLE CHEST 1 VIEW COMPARISON:  01/25/2024. FINDINGS:  Defibrillator pad overlies the left chest. The heart size and mediastinal contours are unchanged. Stable left-sided pacemaker. Bronchovascular crowding versus pulmonary vascular congestion. No focal consolidation, pleural effusion, or pneumothorax. No acute osseous abnormality. IMPRESSION: Low lung volumes with bronchovascular crowding versus pulmonary vascular congestion. Electronically Signed   By: Harrietta Sherry M.D.   On: 04/23/2024 12:29    Pertinent labs & imaging results that were  available during my care of the patient were reviewed by me and considered in my medical decision making (see MDM for details).  Medications Ordered in ED Medications  lactated ringers  infusion ( Intravenous New Bag/Given 04/23/24 1112)  insulin  regular, human (MYXREDLIN ) 100 units/ 100 mL infusion (8.1 Units/hr Intravenous New Bag/Given 04/23/24 1205)  lactated ringers  infusion (has no administration in time range)  dextrose  5 % in lactated ringers  infusion (has no administration in time range)  dextrose  50 % solution 0-50 mL (has no administration in time range)  Chlorhexidine  Gluconate Cloth 2 % PADS 6 each (has no administration in time range)  docusate sodium  (COLACE) capsule 100 mg (has no administration in time range)  polyethylene glycol (MIRALAX  / GLYCOLAX ) packet 17 g (has no administration in time range)  heparin  injection 5,000 Units (has no administration in time range)  ondansetron  (ZOFRAN ) injection 4 mg (has no administration in time range)  lactated ringers  bolus 1,000 mL (1,000 mLs Intravenous New Bag/Given 04/23/24 1109)  ceFEPIme  (MAXIPIME ) 2 g in sodium chloride  0.9 % 100 mL IVPB (0 g Intravenous Stopped 04/23/24 1145)  vancomycin  (VANCOREADY) IVPB 2000 mg/400 mL (2,000 mg Intravenous New Bag/Given 04/23/24 1151)  lactated ringers  bolus 1,000 mL (1,000 mLs Intravenous New Bag/Given 04/23/24 1137)  lactated ringers  bolus 1,622 mL (1,622 mLs Intravenous New Bag/Given 04/23/24 1146)                                                                                                                                      Procedures .Critical Care  Performed by: Mannie Fairy DASEN, DO Authorized by: Mannie Fairy DASEN, DO   Critical care provider statement:    Critical care time (minutes):  77   Critical care was necessary to treat or prevent imminent or life-threatening deterioration of the following conditions:  Metabolic crisis   Critical care was time spent personally by me on the following activities:  Development of treatment plan with patient or surrogate, discussions with consultants, evaluation of patient's response to treatment, examination of patient, ordering and review of laboratory studies, ordering and review of radiographic studies, ordering and performing treatments and interventions, pulse oximetry, re-evaluation of patient's condition and review of old charts   (including critical care time)  Medical Decision Making / ED Course   This patient presents to the ED for concern of altered mental status, hyperglycemia, this involves an extensive number of treatment options, and is a complaint that carries with it a high risk of complications and morbidity.  The differential diagnosis includes DKA, encephalopathy, consider CVA, sepsis.  MDM: Patient's blood work is certainly consistent with DKA.  Unclear the etiology.  Patient is insulin -dependent, per EMS report it sounds as though nursing staff is unsure the last time patient received his insulin  at his skilled nursing facility.  He is afebrile, but will started on broad-spectrum antibiotics, blood cultures obtained.  He is receiving IV fluids.  Out of concern for fluid overload, 3 L of IV fluid were provided.  Patient started on insulin  drip.  With facial droop, I believe that this is likely secondary to underlying encephalopathy.  Will treat the patient's underlying medical conditions.  With unknown last known well, patient does  not meet criteria for stroke activation.  Patient admitted to ICU.  Reassessment 2 PM-I reevaluated the patient following his fluid bolus.  This patient may have sepsis.  He has a downtrending lactic acid.   Additional history obtained: -Additional history obtained from EMS -External records from outside source obtained and reviewed including: Chart review including previous notes, labs, imaging, consultation notes   Lab Tests: -I ordered, reviewed, and interpreted labs.   The pertinent results include:   Labs Reviewed  COMPREHENSIVE METABOLIC PANEL WITH GFR - Abnormal; Notable for the following components:      Result Value   Sodium 134 (*)    Potassium 5.4 (*)    Chloride 91 (*)    CO2 16 (*)    Glucose, Bld 820 (*)    BUN 53 (*)    Creatinine, Ser 4.36 (*)    Albumin 3.3 (*)    AST 52 (*)    Total Bilirubin 2.9 (*)    GFR, Estimated 12 (*)    Anion gap 27 (*)    All other components within normal limits  CBC WITH DIFFERENTIAL/PLATELET - Abnormal; Notable for the following components:   WBC 12.6 (*)    RBC 4.19 (*)    RDW 16.7 (*)    Neutro Abs 11.2 (*)    Lymphs Abs 0.3 (*)    All other components within normal limits  URINALYSIS, W/ REFLEX TO CULTURE (INFECTION SUSPECTED) - Abnormal; Notable for the following components:   APPearance HAZY (*)    Glucose, UA >=500 (*)    Protein, ur 100 (*)    Leukocytes,Ua TRACE (*)    Bacteria, UA RARE (*)    All other components within normal limits  BETA-HYDROXYBUTYRIC ACID - Abnormal; Notable for the following components:   Beta-Hydroxybutyric Acid >8.00 (*)    All other components within normal limits  CBG MONITORING, ED - Abnormal; Notable for the following components:   Glucose-Capillary >600 (*)    All other components within normal limits  I-STAT CG4 LACTIC ACID, ED - Abnormal; Notable for the following components:   Lactic Acid, Venous 7.6 (*)    All other components within normal limits  I-STAT CHEM 8, ED -  Abnormal; Notable for the following components:   Sodium 131 (*)    Potassium 5.3 (*)    Chloride 97 (*)    BUN 50 (*)    Creatinine, Ser 3.80 (*)    Glucose, Bld >700 (*)    Calcium, Ion 1.07 (*)    TCO2 14 (*)    All other components within normal limits  I-STAT VENOUS BLOOD GAS, ED - Abnormal; Notable for the following components:   pH, Ven 7.183 (*)    pCO2, Ven 36.9 (*)    pO2, Ven 46 (*)    Bicarbonate 13.9 (*)    TCO2 15 (*)    Acid-base deficit 14.0 (*)    Sodium 129 (*)    Potassium 5.3 (*)    Calcium, Ion 1.06 (*)    All other components within normal limits  I-STAT CG4 LACTIC ACID, ED - Abnormal; Notable for the following components:   Lactic Acid, Venous 5.0 (*)    All  other components within normal limits  CBG MONITORING, ED - Abnormal; Notable for the following components:   Glucose-Capillary >600 (*)    All other components within normal limits  CBG MONITORING, ED - Abnormal; Notable for the following components:   Glucose-Capillary >600 (*)    All other components within normal limits  RESP PANEL BY RT-PCR (RSV, FLU A&B, COVID)  RVPGX2  CULTURE, BLOOD (ROUTINE X 2)  CULTURE, BLOOD (ROUTINE X 2)  MRSA NEXT GEN BY PCR, NASAL  PROTIME-INR  CBC  CREATININE, SERUM  LIPASE, BLOOD     EKG-atrial fibrillation, Q waves.  No STEMI.   Imaging Studies ordered: I ordered imaging studies including chest x-ray I independently visualized and interpreted imaging. I agree with the radiologist interpretation   Medicines ordered and prescription drug management: Meds ordered this encounter  Medications   lactated ringers  infusion   lactated ringers  bolus 1,000 mL    Reason 30 mL/kg dose is not being ordered:   First Lactic Acid Pending   ceFEPIme  (MAXIPIME ) 2 g in sodium chloride  0.9 % 100 mL IVPB    Antibiotic Indication::   Other Indication (list below)    Other Indication::   Unknown Source.   DISCONTD: vancomycin  (VANCOCIN ) IVPB 1000 mg/200 mL premix     Indication::   Other Indication (list below)    Other Indication::   Unknown Source.   vancomycin  (VANCOREADY) IVPB 2000 mg/400 mL    Indication::   Other Indication (list below)    Other Indication::   Unknown Source.   lactated ringers  bolus 1,000 mL   lactated ringers  bolus 1,622 mL   insulin  regular, human (MYXREDLIN ) 100 units/ 100 mL infusion    EndoTool Goal Range::   140-180    Type of Diabetes:   Type 2    Mode of Therapy:   ENDOX1 for DKA    Start Method:   EndoTool to calculate   lactated ringers  infusion   dextrose  5 % in lactated ringers  infusion   dextrose  50 % solution 0-50 mL   Chlorhexidine  Gluconate Cloth 2 % PADS 6 each   docusate sodium  (COLACE) capsule 100 mg   polyethylene glycol (MIRALAX  / GLYCOLAX ) packet 17 g   heparin  injection 5,000 Units   ondansetron  (ZOFRAN ) injection 4 mg    -I have reviewed the patients home medicines and have made adjustments as needed  Critical interventions Management of severe DKA with encephalopathy    Reevaluation: After the interventions noted above, I reevaluated the patient and found that they have :improved  Co morbidities that complicate the patient evaluation  Past Medical History:  Diagnosis Date   Acute on chronic diastolic congestive heart failure (HCC)    Arthus phenomenon    Atrioventricular block, complete (HCC)    Cardiac pacemaker in situ 07/22/2010   Chronic kidney disease, stage 3 (HCC)    Presumably from longstanding DM & HTN. Scr has gradually increased over the past 2 years (1.6 in 12/2010, 2 in 03/2011, and up to 2.1 in 01/2013)   DIABETES MELLITUS, TYPE I, ADULT ONSET 08/06/2007   DM (diabetes mellitus) (HCC)    adult onset    Essential hypertension, benign 08/06/2007   Very good BP control   History of second degree heart block    HLD (hyperlipidemia)    HYPERLIPIDEMIA 08/06/2007   Hypopotassemia    Morbid obesity (HCC)    Obesity    Other and unspecified angina pectoris    typical   Type I  (  juvenile type) diabetes mellitus without mention of complication, not stated as uncontrolled    (04/25/13 OV with Dr. Rayburn) = Poorly controlled & is working  w/Dr. Karlos to improve this. Most recent Hgb A1c was 8.2% but Mr. Ovens reports it was down to 7.6 last month.          Final Clinical Impression(s) / ED Diagnoses Final diagnoses:  Diabetic ketoacidosis without coma associated with other specified diabetes mellitus (HCC)     @PCDICTATION @    Mannie Pac T, DO 04/23/24 1401

## 2024-04-23 NOTE — TOC Initial Note (Signed)
 Transition of Care [facility]) - Initial/Assessment Note    Patient Details  Name: Connor Dixon MRN: [id number] Date of Birth: 03-21-1935  Transition of Care [facility]) CM/SW Contact:    Lauraine FORBES Saa, LCSWA Phone Number: 04/23/2024, 3:48 PM  Clinical Narrative:                  3:49 PM Per chart review, patient is from [facility]. Patient has a PCP and insurance. Patient has SNF history with [facility]. Patient has HH history with [facility]. Patient has DME (RW, cane, BSC, wheelchair) history. Patient's preferred pharmacy's are [facility] and [facility]. CSW will continue to follow.   Expected Discharge Plan: Assisted Living Barriers to Discharge: Continued Medical Work up   Patient Goals and CMS Choice            Expected Discharge Plan and Services In-house Referral: Clinical Social Work     Living arrangements for the past 2 months: Assisted Living Facility                                      Prior Living Arrangements/Services Living arrangements for the past 2 months: Assisted Living Facility Lives with:: Facility Resident Patient language and need for interpreter reviewed:: Yes          Care giver support system in place?: Yes (comment) Current home services: DME Criminal Activity/Legal Involvement Pertinent to Current Situation/Hospitalization: No - Comment as needed  Activities of Daily Living      Permission Sought/Granted Permission sought to share information with : Family Supports, Oceanographer granted to share information with : No (Contact information on chart)  Share Information with NAME: [name] Popko  Permission granted to share info w AGENCY: [facility]  Permission granted to share info w Relationship: Son  Permission granted to share info w Contact Information: [phone]  Emotional Assessment         Alcohol  / Substance Use: Not  Applicable Psych Involvement: No (comment)  Admission diagnosis:  Diabetic ketoacidosis (HCC) [E11.10] Diabetic ketoacidosis without coma associated with other specified diabetes mellitus (HCC) [E13.10] Patient Active Problem List   Diagnosis Date Noted   Diabetic ketoacidosis (HCC) 04/23/2024   Chronic heart failure with reduced ejection fraction (HFrEF, <= 40%) (HCC) 01/25/2024   Acute cystitis with hematuria 01/25/2024   Acute exacerbation of CHF (congestive heart failure) (HCC) 12/27/2023   Acute on chronic heart failure with preserved ejection fraction (HFpEF) (HCC) 12/26/2023   Acute kidney injury superimposed on chronic kidney disease 12/26/2023   Heart block AV complete (HCC) 12/26/2023   Palliative care by specialist 10/16/2023   Acute encephalopathy 10/10/2023   Elevated troponin 10/10/2023   Prolonged QT interval 10/10/2023   CKD stage 4 due to type 2 diabetes mellitus (HCC) 09/06/2023   Seasonal allergic rhinitis due to pollen 09/06/2023   Post-traumatic osteoarthritis of left shoulder 01/05/2023   Paroxysmal atrial fibrillation (HCC) 09/03/2021   Diabetic nephropathy associated with type 2 diabetes mellitus (HCC) 06/09/2021   Vitamin D  deficiency disease 06/09/2021   Insulin -requiring or dependent type II diabetes mellitus (HCC) 06/09/2021   Encounter for general adult medical examination with abnormal findings 06/09/2020   Sinus node dysfunction (HCC) 02/13/2019   Presence of permanent cardiac pacemaker 07/22/2010   Hyperlipidemia with target LDL less than 70 08/06/2007   Hypertension associated with diabetes (HCC)  08/06/2007   PCP:  [name], MD Pharmacy:   [facility] [place] [place] - [address] [place] [address] Phone: [phone] Fax: [phone]  [facility] [address] [place] [address] Phone: [phone] Fax: [phone]     Social Drivers of Health (SDOH) Social  History: SDOH Screenings   Food Insecurity: Patient Unable To Answer (12/26/2023)  Housing: Unknown (12/26/2023)  Transportation Needs: Patient Unable To Answer (12/26/2023)  Utilities: Patient Unable To Answer (12/26/2023)  Alcohol  Screen: Low Risk  (04/06/2023)  Depression (PHQ2-9): Low Risk  (09/06/2023)  Financial Resource Strain: Low Risk  (04/06/2023)  Physical Activity: Inactive (04/06/2023)  Social Connections: Unknown (01/25/2024)  Stress: No Stress Concern Present (04/06/2023)  Tobacco Use: Low Risk  (04/23/2024)  Health Literacy: Adequate Health Literacy (04/06/2023)   SDOH Interventions:     Readmission Risk Interventions     No data to display

## 2024-04-23 NOTE — Progress Notes (Addendum)
 Going down for CT now - will get neuro involved if new stroke seen (mz:qjrpjo droop) Called and spoke with son, [name] Tierno. He states that recently insulin  dose was lowered at facility due to hypoglycemic episodes.  Goddaughter is at bedside and updated Serial monitor of DKA labs  Tinnie FORBES Adolph DEVONNA [facility] 04/23/24 4:55 PM  Please see [email or web address] for pager details.  From 7A-7P if no response, please call [phone] After hours, please call ELink [phone]

## 2024-04-23 NOTE — Consult Note (Signed)
 NAME:  Connor Dixon, MRN:  [id number], DOB:  1934/07/31, LOS: 0 ADMISSION DATE:  04/23/2024, CONSULTATION DATE:  12/9 REFERRING MD: Mannie, EDP CHIEF COMPLAINT: DKA    History of Present Illness:  89 year old male with past medical history of hypertension, hyperlipidemia, CKD III, complete heart block s/p pacemaker, diastolic CHF, type 1 diabetes who presents to the ED for altered mental status.   Patient is unable to participate in history portion of exam due to encephalopathy. Information obtained from chart review.   Coming from [facility]  facility with AMS. EMS called and noted to be hypotensive and confused. Blood glucose check was noted to be high. He was brought to [facility]. Temp 99.4 on presentation, mildly tachypneic, BP soft. Labs notable for Na 134, K 5.4, bicarb 16, serum glucose 820, BUN 53, sCr 4.36, bili 2.9, AG 27, BHB >8, WBC 12.6, I-stat lactic 7.6>5, UA negative UTI and oddly negative for ketones. VBG pH 7.183, pCO2 36.9, bicarb 13.9. blood cultures sent. He was given 3.6L IVF resuscitation, vancomycin  and cefepime  and started on insulin  drip. CCM was consulted in setting of encephalopathy and DKA. On exam he is quite altered, protecting his airway. When I went to examine him he was mildly combative, could not tell me his name. Will admit to ICU   Pertinent  Medical History  hypertension, hyperlipidemia, CKD III, complete heart block s/p pacemaker, diastolic CHF, type 1 diabetes   Significant Hospital Events: Including procedures, antibiotic start and stop dates in addition to other pertinent events   12/9: admit to ICU for encephalopathy, DKA   Interim History / Subjective:  Admit to ICU   Objective   Blood pressure (!) 96/55, pulse (!) 58, temperature 99.4 F (37.4 C), temperature source Rectal, resp. rate (!) 25, weight 81.1 kg, SpO2 100%.       No intake or output data in the 24 hours ending 04/23/24 1309 Filed Weights   04/23/24 1035  Weight: 81.1 kg     Examination: General: elderly male, laying in bed, ill appearing  HENT: right facial droop, perrla, poor dentition, mucous membranes dry  Lungs: ctab, room air, even and unlabored  Cardiovascular: s1s2, sinus tachycardia, no murmur, no edema, warm  Abdomen: flat soft, no grimacing or guarding on palpation  Extremities: warm, no edema  Neuro: awake, confused, cannot tell me his name, mumbles, moving extremities  GU: foley, yellow urine   Resolved Hospital Problem list    Assessment & Plan:  Diabetic ketoacidosis  History of Type 1 Diabetes  Serum glucose 820, BHB >8, pH 7.183, bicarb 16. Oddly no ketonuria. Given 3.6L IVF resusc. Query onset from concomitant sepsis picture. A1c checked 01/27/24 9.1.  - insulin  gtt w/ endotool  - cbg per endotool monitoring  - add on lipase  - already fluid resuscitated; LR cont fluid, transition to D5LR when prompted by endotool  - monitor elytes closely - trend bmp, bhb  Lactic acidosis   In part related to DKA, but hypotensive on arrival. Sepsis picture.  - was fluid resuscitated  - trend  - can start levo if persistently hypotensive   Sepsis?  Low grade temp 99 on arrival; WBC 12.6. UA negative. CXR negative. Covid/flu/rsv negative.  - CT CAP ordered  - 20 pathogen panel  - f/u blood cultures  - empirically cover with vanc/zosyn   - trend fever, wbc, lactic curve   Acute metabolic encephalopathy  - CT head  - ammonia  - treat DKA and sepsis  - neuro  checks  - delirium precautions   Acute kidney injury on CKD III  Hyperkalemia  High anion gap metabolic acidosis  - fluid resuscitate  - ongoing LR IVF  - correct DKA, monitor K closely - bmp q6h  - endotool until gap closes   History of hypertension  History of hyperlipidemia  - no antihypertensives today, re-eval daily  - not on statin > obtain lipid panel   Diastolic CHF  Complete heart block s/p pacemaker placement  PAF on Eliquis   - tele monitoring  - con't home  Eliquis    BPH  - con't home tamsulosin   History of dementia  - neuro and delirium checks/precautions   Labs   CBC: Recent Labs  Lab 04/23/24 1050 04/23/24 1109  WBC 12.6*  --   NEUTROABS 11.2*  --   HGB 13.1 15.0  14.6  HCT 41.2 44.0  43.0  MCV 98.3  --   PLT 257  --     Basic Metabolic Panel: Recent Labs  Lab 04/23/24 1050 04/23/24 1109  NA 134* 131*  129*  K 5.4* 5.3*  5.3*  CL 91* 97*  CO2 16*  --   GLUCOSE 820* >700*  BUN 53* 50*  CREATININE 4.36* 3.80*  CALCIUM 9.1  --    GFR: Estimated Creatinine Clearance: 13.3 mL/min (A) (by C-G formula based on SCr of 3.8 mg/dL (H)). Recent Labs  Lab 04/23/24 1050 04/23/24 1110  WBC 12.6*  --   LATICACIDVEN  --  7.6*    Liver Function Tests: Recent Labs  Lab 04/23/24 1050  AST 52*  ALT 15  ALKPHOS 62  BILITOT 2.9*  PROT 6.9  ALBUMIN 3.3*   No results for input(s): LIPASE, AMYLASE in the last 168 hours. No results for input(s): AMMONIA in the last 168 hours.  ABG    Component Value Date/Time   HCO3 13.9 (L) 04/23/2024 1109   TCO2 14 (L) 04/23/2024 1109   TCO2 15 (L) 04/23/2024 1109   ACIDBASEDEF 14.0 (H) 04/23/2024 1109   O2SAT 71 04/23/2024 1109     Coagulation Profile: Recent Labs  Lab 04/23/24 1050  INR 1.1    Cardiac Enzymes: No results for input(s): CKTOTAL, CKMB, CKMBINDEX, TROPONINI in the last 168 hours.  HbA1C: Hgb A1c MFr Bld  Date/Time Value Ref Range Status  01/27/2024 06:03 AM 9.1 (H) 4.8 - 5.6 % Final    Comment:    (NOTE) Diagnosis of Diabetes The following HbA1c ranges recommended by the American Diabetes Association (ADA) may be used as an aid in the diagnosis of diabetes mellitus.  Hemoglobin             Suggested A1C NGSP%              Diagnosis  <5.7                   Non Diabetic  5.7-6.4                Pre-Diabetic  >6.4                   Diabetic  <7.0                   Glycemic control for                       adults with  diabetes.    09/06/2023 03:08 PM 8.7 (H) 4.6 - 6.5 % Final    Comment:  Glycemic Control Guidelines for People with Diabetes:Non Diabetic:  <6%Goal of Therapy: <7%Additional Action Suggested:  >8%     CBG: Recent Labs  Lab 04/23/24 1035 04/23/24 1241  GLUCAP >600* >600*    Review of Systems:   As above   Past Medical History:  He,  has a past medical history of Acute on chronic diastolic congestive heart failure (HCC), Arthus phenomenon, Atrioventricular block, complete (HCC), Cardiac pacemaker in situ (07/22/2010), Chronic kidney disease, stage 3 (HCC), DIABETES MELLITUS, TYPE I, ADULT ONSET (08/06/2007), DM (diabetes mellitus) (HCC), Essential hypertension, benign (08/06/2007), History of second degree heart block, HLD (hyperlipidemia), HYPERLIPIDEMIA (08/06/2007), Hypopotassemia, Morbid obesity (HCC), Obesity, Other and unspecified angina pectoris, and Type I (juvenile type) diabetes mellitus without mention of complication, not stated as uncontrolled.   Surgical History:   Past Surgical History:  Procedure Laterality Date   CATARACT EXTRACTION  8/08   [place]   PPM GENERATOR CHANGEOUT N/A 02/13/2019   Procedure: PPM GENERATOR CHANGEOUT;  Surgeon: Waddell Danelle ORN, MD;  Location: [facility] INVASIVE CV LAB;  Service: Cardiovascular;  Laterality: N/A;   PTVDP  12/11   PPM - [employer]   RIGHT HEART CATH N/A 12/29/2023   Procedure: RIGHT HEART CATH;  Surgeon: Cherrie Toribio SAUNDERS, MD;  Location: [facility] INVASIVE CV LAB;  Service: Cardiovascular;  Laterality: N/A;   TOTAL HIP ARTHROPLASTY  04/05/06   right     Social History:   reports that he has never smoked. He has never used smokeless tobacco. He reports that he does not drink alcohol  and does not use drugs.   Family History:  His family history includes Asthma in his father and mother; Coronary artery disease in his father and mother; Diabetes in his father; Heart attack (age of onset: 86) in his mother; Hyperlipidemia in his father;  Stroke (age of onset: 80) in his father. There is no history of Colon cancer.   Allergies Allergies  Allergen Reactions   Oysters [Shellfish Allergy] Hives and Swelling   Citrus Hives   Pecan Extract Hives   Cabbage Other (See Comments)    Reaction?   Lemon Oil Hives   Walnut Other (See Comments)    Reaction?   Apple Juice Other (See Comments)    Sore throat   Farxiga  [Dapagliflozin ] Other (See Comments)    Low blood sugar     Home Medications  Prior to Admission medications   Medication Sig Start Date End Date Taking? Authorizing Provider  acetaminophen  (TYLENOL ) 325 MG tablet Take 650 mg by mouth every 8 (eight) hours as needed (for pain).    [provider]  allopurinol  (ZYLOPRIM ) 100 MG tablet TAKE 1 TABLET(100 MG) BY MOUTH DAILY 09/25/23   [name], MD  apixaban  (ELIQUIS ) 2.5 MG TABS tablet Take 1 tablet (2.5 mg total) by mouth 2 (two) times daily. 08/14/23   [name], MD  calcitRIOL  (ROCALTROL ) 0.25 MCG capsule TAKE 1 CAPSULE BY MOUTH DAILY RETURN IN ABOUT 6 MONTHS(AROUND 02/04/2023) Patient taking differently: Take 0.25 mcg by mouth in the morning. 08/14/23   [name], MD  cefadroxil  (DURICEF) 500 MG capsule Take 2 capsules (1,000 mg total) by mouth daily. 01/28/24   Jadine Toribio SQUIBB, MD  Cholecalciferol (VITAMIN D3) 1000 units CAPS Take 1,000 Units by mouth in the morning.    [provider]  Continuous Glucose Sensor (DEXCOM G7 SENSOR) MISC 1 Act by Does not apply route daily. Patient taking differently: Inject 1 Device into the skin See admin  instructions. Place 1 new sensor into the skin every 10 days 01/06/23   [name], MD  cyanocobalamin  1000 MCG tablet Take 1 tablet (1,000 mcg total) by mouth daily. 11/08/23   Gherghe, Costin M, MD  Dextrose , Diabetic Use, (INSTA-GLUCOSE PO) Take 1 Tube by mouth as needed (for a BGL less than 60 and drink orange juice beforehand, if possible).    [provider]  feeding supplement,  GLUCERNA SHAKE, (GLUCERNA SHAKE) LIQD Take 237 mLs by mouth daily. Patient taking differently: Take 237 mLs by mouth See admin instructions. Drink 237 ml's of VANILLA GLUCERNA in the morning 11/07/23   Gherghe, Costin M, MD  ferrous sulfate  325 (65 FE) MG tablet Take 1 tablet (325 mg total) by mouth daily with breakfast. 11/08/23   Trixie Nilda HERO, MD  folic acid  (FOLVITE ) 1 MG tablet Take 1 tablet (1 mg total) by mouth daily. 11/08/23   Gherghe, Costin M, MD  Glucagon  (GVOKE HYPOPEN  2-PACK) 1 MG/0.2ML SOAJ Inject 1 Act into the skin daily as needed. 04/05/22   [name], MD  hydrALAZINE  (APRESOLINE ) 10 MG tablet Take 1 tablet (10 mg total) by mouth 3 (three) times daily. 12/29/23   Fairy Frames, MD  insulin  aspart (NOVOLOG ) 100 UNIT/ML FlexPen Inject 0-6 Units into the skin See admin instructions. Inject 0-6 units into the skin three times a day before meals, per sliding scale: BGL 0-150 = give nothing; 151-190 = 1 unit; 191-230 = 2 units; 231-270 = 3 units; 271-310 = 4 units; 311-350 = 5 units; 351-399 = 6 units; >400 = CALL MD!    [provider]  LANTUS  SOLOSTAR 100 UNIT/ML Solostar Pen Inject 8 Units into the skin at bedtime.    [provider]  levocetirizine (XYZAL ) 5 MG tablet Take 1 tablet (5 mg total) by mouth every evening. 09/06/23   [name], MD  loratadine  (CLARITIN ) 10 MG tablet Take 10 mg by mouth daily as needed for allergies.    [provider]  metoprolol  succinate (TOPROL -XL) 25 MG 24 hr tablet Take 1 tablet (25 mg total) by mouth daily. 12/30/23   Fairy Frames, MD  tamsulosin  (FLOMAX ) 0.4 MG CAPS capsule Take 1 capsule (0.4 mg total) by mouth daily. 11/08/23   Gherghe, Costin M, MD  torsemide  (DEMADEX ) 20 MG tablet Take 1 tablet (20 mg total) by mouth daily. Give extra dose for weight gain of 3lb in 1 day or 5lb in 1 week Patient taking differently: Take 20 mg by mouth See admin instructions. Take 20 mg by mouth at 8 AM and an additional 20  mg (to equal a total dose of 40 mg) from 01/24/2024 through 01/28/2024 12/30/23   Fairy Frames, MD     Critical care time: 32   The patient is critically ill with multiple organ system failure and requires high complexity decision making for assessment and support, frequent evaluation and titration of therapies, advanced monitoring, review of radiographic studies and interpretation of complex data.    Critical Care Time devoted to patient care services, exclusive of separately billable procedures, described in this note is 45   Tinnie FORBES Adolph DEVONNA [place] 04/23/24 2:03 PM  Please see [email or web address] for pager details.  From 7A-7P if no response, please call [phone] After hours, please call ELink [phone]

## 2024-04-23 NOTE — ED Triage Notes (Signed)
 Patient bib GCMES from [facility]  for altered  mental status. EMS reports that he is hypotensive, hyperglycemic and has been having urinary retention. Unknown how many days. EMS reports A-fib for a brief period on the monitor.

## 2024-04-23 NOTE — Progress Notes (Signed)
 Pharmacy Antibiotic Note  Connor Dixon is a 89 y.o. male admitted on 04/23/2024 with DKA and sepsis. Pt noted to be in AKI. Vancomycin  2g in ED. Pharmacy has been consulted for vancomycin  dosing.  Plan: Vancomycin  dose per levels with unstable renal function F/u infectious work up, renal function, and length of therapy Vancomycin  levels as needed   Weight: 81.1 kg (178 lb 12.7 oz)  Temp (24hrs), Avg:99.4 F (37.4 C), Min:99.4 F (37.4 C), Max:99.4 F (37.4 C)  Recent Labs  Lab 04/23/24 1050 04/23/24 1109 04/23/24 1110 04/23/24 1348  WBC 12.6*  --   --   --   CREATININE 4.36* 3.80*  --   --   LATICACIDVEN  --   --  7.6* 5.0*    Estimated Creatinine Clearance: 13.3 mL/min (A) (by C-G formula based on SCr of 3.8 mg/dL (H)).    Allergies  Allergen Reactions   Oysters [Shellfish Allergy] Hives and Swelling   Citrus Hives   Pecan Extract Hives   Cabbage Other (See Comments)    Reaction?   Lemon Oil Hives   Walnut Other (See Comments)    Reaction?   Apple Juice Other (See Comments)    Sore throat   Farxiga  [Dapagliflozin ] Other (See Comments)    Low blood sugar    Thank you for allowing pharmacy to be a part of this patient's care.  Leonor GORMAN Bash 04/23/2024 3:11 PM

## 2024-04-24 ENCOUNTER — Encounter: Admitting: Internal Medicine

## 2024-04-24 LAB — GLUCOSE, CAPILLARY
Glucose-Capillary: 129 mg/dL — ABNORMAL HIGH (ref 70–99)
Glucose-Capillary: 159 mg/dL — ABNORMAL HIGH (ref 70–99)
Glucose-Capillary: 149 mg/dL — ABNORMAL HIGH (ref 70–99)
Glucose-Capillary: 227 mg/dL — ABNORMAL HIGH (ref 70–99)
Glucose-Capillary: 198 mg/dL — ABNORMAL HIGH (ref 70–99)
Glucose-Capillary: 148 mg/dL — ABNORMAL HIGH (ref 70–99)
Glucose-Capillary: 143 mg/dL — ABNORMAL HIGH (ref 70–99)

## 2024-04-24 LAB — BLOOD CULTURE ID PANEL (REFLEXED) - BCID2

## 2024-04-24 LAB — CBC WITH DIFFERENTIAL/PLATELET
Abs Immature Granulocytes: 0.05 K/uL (ref 0.00–0.07)
Basophils Absolute: 0 K/uL (ref 0.0–0.1)
Basophils Relative: 0 %
Eosinophils Absolute: 0 K/uL (ref 0.0–0.5)
Eosinophils Relative: 0 %
HCT: 36.3 % — ABNORMAL LOW (ref 39.0–52.0)
Hemoglobin: 12.4 g/dL — ABNORMAL LOW (ref 13.0–17.0)
Immature Granulocytes: 0 %
Lymphocytes Relative: 7 %
Lymphs Abs: 0.8 K/uL (ref 0.7–4.0)
MCH: 31.5 pg (ref 26.0–34.0)
MCHC: 34.2 g/dL (ref 30.0–36.0)
MCV: 92.1 fL (ref 80.0–100.0)
Monocytes Absolute: 1.6 K/uL — ABNORMAL HIGH (ref 0.1–1.0)
Monocytes Relative: 13 %
Neutro Abs: 9.1 K/uL — ABNORMAL HIGH (ref 1.7–7.7)
Neutrophils Relative %: 80 %
Platelets: 221 K/uL (ref 150–400)
RBC: 3.94 MIL/uL — ABNORMAL LOW (ref 4.22–5.81)
RDW: 16.1 % — ABNORMAL HIGH (ref 11.5–15.5)
WBC: 11.5 K/uL — ABNORMAL HIGH (ref 4.0–10.5)
nRBC: 0 % (ref 0.0–0.2)

## 2024-04-24 LAB — BASIC METABOLIC PANEL WITH GFR
Anion gap: 9 (ref 5–15)
BUN: 60 mg/dL — ABNORMAL HIGH (ref 8–23)
CO2: 27 mmol/L (ref 22–32)
Calcium: 8.7 mg/dL — ABNORMAL LOW (ref 8.9–10.3)
Chloride: 100 mmol/L (ref 98–111)
Creatinine, Ser: 3.83 mg/dL — ABNORMAL HIGH (ref 0.61–1.24)
GFR, Estimated: 15 mL/min — ABNORMAL LOW (ref >60)
Glucose, Bld: 140 mg/dL — ABNORMAL HIGH (ref 70–99)
Potassium: 4.2 mmol/L (ref 3.5–5.1)
Sodium: 136 mmol/L (ref 135–145)

## 2024-04-24 LAB — BETA-HYDROXYBUTYRIC ACID: Beta-Hydroxybutyric Acid: 0.08 mmol/L (ref 0.05–0.27)

## 2024-04-24 LAB — VANCOMYCIN, RANDOM: Vancomycin Rm: 22 ug/mL

## 2024-04-24 MED ORDER — INSULIN ASPART 100 UNIT/ML IJ SOLN
1.0000 [IU] | INTRAMUSCULAR | Status: DC
  Administered 2024-04-24: 1 [IU] via SUBCUTANEOUS
  Administered 2024-04-24: 3 [IU] via SUBCUTANEOUS
  Filled 2024-04-24 (×2): qty 3

## 2024-04-24 MED ORDER — INSULIN ASPART 100 UNIT/ML IJ SOLN
0.0000 [IU] | INTRAMUSCULAR | Status: DC
  Administered 2024-04-24 – 2024-04-25 (×3): 4 [IU] via SUBCUTANEOUS
  Administered 2024-04-25: 11 [IU] via SUBCUTANEOUS
  Administered 2024-04-25: 3 [IU] via SUBCUTANEOUS
  Administered 2024-04-26: 7 [IU] via SUBCUTANEOUS
  Filled 2024-04-24: qty 7
  Filled 2024-04-24: qty 3
  Filled 2024-04-24: qty 4
  Filled 2024-04-24: qty 11
  Filled 2024-04-24: qty 2
  Filled 2024-04-24: qty 1
  Filled 2024-04-24: qty 4

## 2024-04-24 MED ORDER — SODIUM CHLORIDE 0.9 % IV SOLN
3.0000 g | Freq: Two times a day (BID) | INTRAVENOUS | Status: DC
  Administered 2024-04-24 – 2024-04-26 (×5): 3 g via INTRAVENOUS
  Filled 2024-04-24 (×5): qty 8

## 2024-04-24 MED ORDER — INSULIN GLARGINE 100 UNIT/ML ~~LOC~~ SOLN
10.0000 [IU] | Freq: Two times a day (BID) | SUBCUTANEOUS | Status: DC
  Administered 2024-04-24 – 2024-04-26 (×6): 10 [IU] via SUBCUTANEOUS
  Filled 2024-04-24 (×7): qty 0.1

## 2024-04-24 MED ORDER — SODIUM CHLORIDE 0.9 % IV SOLN: INTRAVENOUS | Status: AC

## 2024-04-24 NOTE — NC FL2 (Signed)
 [place]  MEDICAID FL2 LEVEL OF CARE FORM     IDENTIFICATION  Patient Name: Connor Dixon Birthdate: [birth year] Sex: male Admission Date (Current Location): 04/23/2024  [facility] and [place] Number:  Producer, Television/film/video and Address:  [facility] [place], [address] [place], [place] [address]      Provider Number: [id number]  Attending Physician Name and Address:  Jude Harden GAILS, MD  Relative Name and Phone Number:  Cadarius Nevares; Son; [phone]    Current Level of Care: Hospital Recommended Level of Care: Assisted Living Facility Prior Approval Number:    Date Approved/Denied:   PASRR Number: [id number]  Discharge Plan: Other (Comment) (ALF)    Current Diagnoses: Patient Active Problem List   Diagnosis Date Noted   Diabetic ketoacidosis (HCC) 04/23/2024   Chronic heart failure with reduced ejection fraction (HFrEF, <= 40%) (HCC) 01/25/2024   Acute cystitis with hematuria 01/25/2024   Acute exacerbation of CHF (congestive heart failure) (HCC) 12/27/2023   Acute on chronic heart failure with preserved ejection fraction (HFpEF) (HCC) 12/26/2023   Acute kidney injury superimposed on chronic kidney disease 12/26/2023   Heart block AV complete (HCC) 12/26/2023   Palliative care by specialist 10/16/2023   Acute encephalopathy 10/10/2023   Elevated troponin 10/10/2023   Prolonged QT interval 10/10/2023   CKD stage 4 due to type 2 diabetes mellitus (HCC) 09/06/2023   Seasonal allergic rhinitis due to pollen 09/06/2023   Post-traumatic osteoarthritis of left shoulder 01/05/2023   Paroxysmal atrial fibrillation (HCC) 09/03/2021   Diabetic nephropathy associated with type 2 diabetes mellitus (HCC) 06/09/2021   Vitamin D  deficiency disease 06/09/2021   Insulin -requiring or dependent type II diabetes mellitus (HCC) 06/09/2021   Encounter for general adult medical examination with abnormal findings 06/09/2020   Sinus node dysfunction (HCC) 02/13/2019    Presence of permanent cardiac pacemaker 07/22/2010   Hyperlipidemia with target LDL less than 70 08/06/2007   Hypertension associated with diabetes (HCC) 08/06/2007    Orientation RESPIRATION BLADDER Height & Weight        Normal (Room Air) Incontinent, Indwelling catheter Weight: 173 lb 1 oz (78.5 kg) Height:     BEHAVIORAL SYMPTOMS/MOOD NEUROLOGICAL BOWEL NUTRITION STATUS      Incontinent Diet (Please see discharge summary)  AMBULATORY STATUS COMMUNICATION OF NEEDS Skin     Verbally Normal                       Personal Care Assistance Level of Assistance              Functional Limitations Info             SPECIAL CARE FACTORS FREQUENCY                       Contractures Contractures Info: Not present    Additional Factors Info  Code Status, Allergies, Insulin  Sliding Scale Code Status Info: Full Code Allergies Info: Shellfish Allergy, Citrus, Pecan Extract, Apple, Cabbage, Lemon Oil, Walnut, Farxiga  (Dapagliflozin )   Insulin  Sliding Scale Info: Please see discharge summary       Current Medications (04/24/2024):  This is the current hospital active medication list Current Facility-Administered Medications  Medication Dose Route Frequency Provider Last Rate Last Admin   0.9 %  sodium chloride  infusion   Intravenous Continuous Ogan, Okoronkwo U, MD 60 mL/hr at 04/24/24 1000 Infusion Verify at 04/24/24 1000   Ampicillin -Sulbactam (UNASYN ) 3 g in sodium chloride  0.9 %  100 mL IVPB  3 g Intravenous Q12H Alva, Rakesh V, MD       Chlorhexidine  Gluconate Cloth 2 % PADS 6 each  6 each Topical Daily Autry, Lauren E, PA-C   6 each at 04/24/24 1002   dextrose  50 % solution 0-50 mL  0-50 mL Intravenous PRN Mannie Pac T, DO       docusate sodium  (COLACE) capsule 100 mg  100 mg Oral BID PRN Autry, Lauren E, PA-C       folic acid  injection 1 mg  1 mg Intravenous Daily Autry, Lauren E, PA-C   1 mg at 04/24/24 1004   heparin  injection 5,000 Units  5,000 Units  Subcutaneous Q8H Autry, Lauren E, PA-C   5,000 Units at 04/24/24 9471   insulin  aspart (novoLOG ) injection 0-20 Units  0-20 Units Subcutaneous Q4H Alva, Rakesh V, MD       insulin  glargine (LANTUS ) injection 10 Units  10 Units Subcutaneous BID Ogan, Okoronkwo U, MD   10 Units at 04/24/24 1003   ondansetron  (ZOFRAN ) injection 4 mg  4 mg Intravenous Q6H PRN Autry, Lauren E, PA-C       Oral care mouth rinse  15 mL Mouth Rinse 4 times per day Meade Verdon RAMAN, MD   15 mL at 04/24/24 9247   Oral care mouth rinse  15 mL Mouth Rinse PRN Desai, Nikita S, MD       polyethylene glycol (MIRALAX  / GLYCOLAX ) packet 17 g  17 g Oral Daily PRN Autry, Lauren E, PA-C         Discharge Medications: Please see discharge summary for a list of discharge medications.  Relevant Imaging Results:  Relevant Lab Results:   Additional Information SS#[id number]  Lauraine FORBES Saa, LCSWA

## 2024-04-24 NOTE — Progress Notes (Signed)
 eLink Physician-Brief Progress Note Patient Name: Connor Dixon DOB: [birth year] MRN: [id number]   Date of Service  04/24/2024  HPI/Events of Note  Serum Beta Hydroxybutyric acid level 0.24.  eICU Interventions  Patient transitioned off Insulin  gtt per protocol.        [name] U Patricio Popwell 04/24/2024, 12:50 AM

## 2024-04-24 NOTE — Progress Notes (Signed)
 Pt transferred from 68M.  Pt opens eyes to voice & disoriented x 4 External catheter intact  IV fluids continued.  Remains NPO.

## 2024-04-24 NOTE — TOC Progression Note (Signed)
 Transition of Care [facility]: Lambert Rhonda W) - Progression Note    Patient Details  Name: OBINNA EHRESMAN MRN: [id number] Date of Birth: 1935/04/19  Transition of Care [facility]) CM/SW Contact  Lauraine FORBES Saa, LCSWA Phone Number: 04/24/2024, 1:15 PM  Clinical Narrative:     1:15 PM Per progressions, patient has transfer orders to transition out of MICU. MD Resident stated that PT/OT orders would not be beneficial for patient. CSW faxed patient's H&P and FL2 to [facility]. CSW will continue to follow.  Expected Discharge Plan: Assisted Living Barriers to Discharge: Continued Medical Work up               Expected Discharge Plan and Services In-house Referral: Clinical Social Work     Living arrangements for the past 2 months: Assisted Living Facility                                       Social Drivers of Health (SDOH) Interventions SDOH Screenings   Food Insecurity: Patient Unable To Answer (12/26/2023)  Housing: Unknown (12/26/2023)  Transportation Needs: Patient Unable To Answer (12/26/2023)  Utilities: Patient Unable To Answer (12/26/2023)  Alcohol  Screen: Low Risk  (04/06/2023)  Depression (PHQ2-9): Low Risk  (09/06/2023)  Financial Resource Strain: Low Risk  (04/06/2023)  Physical Activity: Inactive (04/06/2023)  Social Connections: Unknown (01/25/2024)  Stress: No Stress Concern Present (04/06/2023)  Tobacco Use: Low Risk  (04/23/2024)  Health Literacy: Adequate Health Literacy (04/06/2023)    Readmission Risk Interventions     No data to display

## 2024-04-24 NOTE — Plan of Care (Signed)
   Problem: Safety: Goal: Non-violent Restraint(s) Outcome: Progressing   Problem: Education: Goal: Knowledge of General Education information will improve Description: Including pain rating scale, medication(s)/side effects and non-pharmacologic comfort measures Outcome: Progressing   Problem: Health Behavior/Discharge Planning: Goal: Ability to manage health-related needs will improve Outcome: Progressing   Problem: Clinical Measurements: Goal: Ability to maintain clinical measurements within normal limits will improve Outcome: Progressing Goal: Will remain free from infection Outcome: Progressing Goal: Diagnostic test results will improve Outcome: Progressing Goal: Respiratory complications will improve Outcome: Progressing Goal: Cardiovascular complication will be avoided Outcome: Progressing

## 2024-04-24 NOTE — Progress Notes (Signed)
 SLP Cancellation Note  Patient Details Name: [name] MRN: [id number] DOB: 09-23-1934   Cancelled treatment:       Reason Eval/Treat Not Completed: Medical issues which prohibited therapy (Per RN, patient lethargic and not following commands, when he does wake is agitated. Will f/u next date.)  Rea Pass MA, CCC-SLP  Kennidee Heyne Meryl 04/24/2024, 12:54 PM

## 2024-04-24 NOTE — Progress Notes (Addendum)
 PHARMACY - PHYSICIAN COMMUNICATION CRITICAL VALUE ALERT - BLOOD CULTURE IDENTIFICATION (BCID)  NICOLAE VASEK is an 89 y.o. male who presented to [facility] on 04/23/2024 with encephalopathy and DKA from SNF   Assessment:  1/4 blood cultures with MRSE (most likely contaminate) -UA clear, MRSA nares negative -Vancomycin  random 22 -WBC 11, sCr 3.8 (bl~2), afebrile -CT: suspecting mild pneumonia; poss abdominal cellulitis   Name of physician (or Provider) Contacted: Dr. Epimenio  Current antibiotics: Zosyn  2.25gm IV every 8 hours + Vancomycin  variable given AKI  Changes to prescribed antibiotics recommended:   -Continue current course  Results for orders placed or performed during the hospital encounter of 04/23/24  Blood Culture ID Panel (Reflexed) (Collected: 04/23/2024 10:41 AM)  Result Value Ref Range   Enterococcus faecalis NOT DETECTED NOT DETECTED   Enterococcus Faecium NOT DETECTED NOT DETECTED   Listeria monocytogenes NOT DETECTED NOT DETECTED   Staphylococcus species DETECTED (A) NOT DETECTED   Staphylococcus aureus (BCID) NOT DETECTED NOT DETECTED   Staphylococcus epidermidis DETECTED (A) NOT DETECTED   Staphylococcus lugdunensis NOT DETECTED NOT DETECTED   Streptococcus species NOT DETECTED NOT DETECTED   Streptococcus agalactiae NOT DETECTED NOT DETECTED   Streptococcus pneumoniae NOT DETECTED NOT DETECTED   Streptococcus pyogenes NOT DETECTED NOT DETECTED   A.calcoaceticus-baumannii NOT DETECTED NOT DETECTED   Bacteroides fragilis NOT DETECTED NOT DETECTED   Enterobacterales NOT DETECTED NOT DETECTED   Enterobacter cloacae complex NOT DETECTED NOT DETECTED   Escherichia coli NOT DETECTED NOT DETECTED   Klebsiella aerogenes NOT DETECTED NOT DETECTED   Klebsiella oxytoca NOT DETECTED NOT DETECTED   Klebsiella pneumoniae NOT DETECTED NOT DETECTED   Proteus species NOT DETECTED NOT DETECTED   Salmonella species NOT DETECTED NOT DETECTED   Serratia marcescens NOT DETECTED  NOT DETECTED   Haemophilus influenzae NOT DETECTED NOT DETECTED   Neisseria meningitidis NOT DETECTED NOT DETECTED   Pseudomonas aeruginosa NOT DETECTED NOT DETECTED   Stenotrophomonas maltophilia NOT DETECTED NOT DETECTED   Candida albicans NOT DETECTED NOT DETECTED   Candida auris NOT DETECTED NOT DETECTED   Candida glabrata NOT DETECTED NOT DETECTED   Candida krusei NOT DETECTED NOT DETECTED   Candida parapsilosis NOT DETECTED NOT DETECTED   Candida tropicalis NOT DETECTED NOT DETECTED   Cryptococcus neoformans/gattii NOT DETECTED NOT DETECTED   Methicillin resistance mecA/C DETECTED (A) NOT DETECTED    Lynwood Poplar, PharmD, BCPS Clinical Pharmacist 04/24/2024 6:10 AM

## 2024-04-24 NOTE — Progress Notes (Addendum)
 NAME:  Connor Dixon, MRN:  [id number], DOB:  1934-12-10, LOS: 1 ADMISSION DATE:  04/23/2024, CONSULTATION DATE:  12/9 REFERRING MD: Mannie, EDP CHIEF COMPLAINT: DKA    History of Present Illness:  89 year old male with past medical history of hypertension, hyperlipidemia, CKD III, complete heart block s/p pacemaker, diastolic CHF, type 1 diabetes who presents to the ED for altered mental status from his SNF at [facility] [place] . Found to be hyperglycemic, hypotensive, altered, and having urinary retention for an unknown amount of time per EMS. Patient is unable to participate in history portion of exam due to encephalopathy. Information obtained from chart review.   In [facility] ED, temp 99.4 on presentation, mildly tachypneic, BP soft. Labs notable for Na 134, K 5.4, bicarb 16, serum glucose 820, BUN 53, sCr 4.36, bili 2.9, AG 27, BHB >8, WBC 12.6, I-stat lactic 7.6>5, UA negative UTI and oddly negative for ketones. VBG pH 7.183, pCO2 36.9, bicarb 13.9. blood cultures sent. He was given 3.6L IVF resuscitation, vancomycin  2g and cefepime  and started on insulin  drip. CCM was consulted in setting of encephalopathy and DKA.   Pertinent  Medical History   Past Medical History:  Diagnosis Date   Acute on chronic diastolic congestive heart failure (HCC)    Arthus phenomenon    Atrioventricular block, complete (HCC)    Cardiac pacemaker in situ 07/22/2010   Chronic kidney disease, stage 3 (HCC)    Presumably from longstanding DM & HTN. Scr has gradually increased over the past 2 years (1.6 in 12/2010, 2 in 03/2011, and up to 2.1 in 01/2013)   DIABETES MELLITUS, TYPE I, ADULT ONSET 08/06/2007   DM (diabetes mellitus) (HCC)    adult onset    Essential hypertension, benign 08/06/2007   Very good BP control   History of second degree heart block    HLD (hyperlipidemia)    HYPERLIPIDEMIA 08/06/2007   Hypopotassemia    Morbid obesity (HCC)    Obesity    Other and unspecified angina pectoris    typical   Type I  (juvenile type) diabetes mellitus without mention of complication, not stated as uncontrolled    (04/25/13 OV with Dr. Rayburn) = Poorly controlled & is working  w/Dr. Karlos to improve this. Most recent Hgb A1c was 8.2% but Mr. Paris reports it was down to 7.6 last month.    Significant Hospital Events: Including procedures, antibiotic start and stop dates in addition to other pertinent events   12/9- Admitted to ICU for encephalopathy, DKA  12/10- 0330 endotool stopped, BHB closed x2  Interim History / Subjective:  Combative and agitated overnight, not following commands.  Objective   Blood pressure (!) 89/68, pulse 88, temperature 98.6 F (37 C), resp. rate 18, weight 78.5 kg, SpO2 99%.        Intake/Output Summary (Last 24 hours) at 04/24/2024 1058 Last data filed at 04/24/2024 1000 Gross per 24 hour  Intake 3602.29 ml  Output 650 ml  Net 2952.29 ml   Filed Weights   04/23/24 1035 04/23/24 1600 04/24/24 0500  Weight: 81.1 kg 76.9 kg 78.5 kg    Examination: General: elderly male, laying in bed, NAD  HENT: chronic right-sided facial droop, perrla, poor dentition, mucous membranes dry  Lungs: ctab, room air, even and unlabored  Cardiovascular: s1s2, sinus tachycardia, no murmur, no edema, warm  Abdomen: flat, soft, nontender to palpation  Extremities: warm, no peripheral edema  Neuro: alert but not oriented, cannot follow commands, mumbles, moving extremities restlessly GU:  foley, yellow urine   Pertinent labs/imaging/tests in past 24 hours: Na: 136 K: 4.2 Cl: 100 BG: 140, previously 820> 700 > 538 > 186 BUN/Cr: 60/3.83 (baseline 25/1.90 as of 12/2023) Anion gap: 9  WBC: 11.5 H/H: 12.4/36.3 Plt: 221  BHBA: >8 > 3.45 > 0.24 > 0.08 (closed x2)  CXR showing low lung volume with bronchovascular crowding vs pulmonary vascular congestion  CT Head showing no acute intracranial abnormality, chronic lacunar infarcts in bilateral basal ganglia and right  periventricular white matter.  CT CAP showing few small foci of nodular airspace/ground glass density in left upper lobe/lingula and left lower lobe suspicious for mild pneumonia. Moderate stool with large fecal impaction at rectum. Abnormal asymmetrical edema within soft tissues of left chest wall, axilla, and upper left extremity, may be indicative of infection vs cellulitis.   Resolved Hospital Problem list   Lactic Acidosis  Severe Diabetic Ketoacidosis Assessment & Plan:   Severe Diabetic ketoacidosis, resolved History of Type 1 Diabetes  P: - POA with serum glucose 820, BHB >8, pH 7.183, bicarb 16. Without ketonuria. Given 3.6L IVF resusciation in ED. Last A1c 9.1 in September 2025. Possible onset with concomitant sepsis picture.  - Insulin  drip with endotool stopped at 0330 12/10. - Currently on NS at 60mL/hr, will increase to 75mL/hr given AKI  - Lipase WNL - BHB closed x 2,  >8 > 3.45 > 0.24 > 0.08 - On lantus  10u BID, home dose is lantus  14u BID (recently lowered last week per APP at SNF for hypoglycemic episodes) --For a couple weeks prior to last, patient had BG running 400+ consistently. Has home Dexcom G7. - Will continue resistant SSI - Continue PO diet for now until baseline neurological status is established d/t concerns for aspiration  - Will place SLP consult for swallow eval -Spoke with son, [name], about patient's baseline neurological status. Has been on and off since TIA in May 2025, some days will talk and follow commands and others will not. Worsens with hospitalizations. Prior to TIA, was mostly fully functional and managed own DM1. Not uncommon for patient to not follow command and respond to questions with the same one-worded answer.   Sepsis P: - Low grade temp 99 on arrival; WBC 12.6. UA negative. CXR negative. RPP and 20 pathogen panel negative.  - Preliminary blood cultures growing gram positive cocci in clusters showing MRSA in 2/4 vials, likely  contaminate - Empirically covered with vanc/zosyn , will deescalate to Unasyn  (D1 = 12/10) for a total of 5 days (12/10 is day 2/5) - Afebrile, leukocytosis improving with abx - CT CAP showing possible mild pneumonia and possible cellulitis in left axilla. Less suspicious for cellulitis due to lack of support on physical exam, afebrile, and improvement of leukocytosis - Continue to trend fever, CBC daily to monitor leukocytosis  Acute metabolic encephalopathy, likely 2/2 sepsis and worsening dementia Vascular Dementia with behavioral disturbances P: - CT head showing no acute abnormality, chronic lacunar infarcts.  - Patient baseline unclear, does not follow commands. Last seen by son on Sunday 12/7, and was noted to be lucid- the patient's was conversing with his sister clearly. However, has had fluctuating mental status since discharge in May, typically will respond with same answer to questions and not always follow command. - Ammonia level WNL - DKA resolved, but still having persistent agitation (overnight) and confusion. - Son discussed adding PRNs for agitation due to recent history of hospitalization worsening delirium, may consider adding if needed. Has not required  any while in ICU.  - Continue neuro checks  - Delirium precautions   Acute kidney injury on CKD III  Hyperkalemia  High anion gap metabolic acidosis  P: - Fluid resuscitate, continue NS IVF at 75mL/hr - Monitor daily BMP to watch electrolytes  History of hypertension  History of hyperlipidemia  P: - no antihypertensives today, BP stable - Not currently on statin due to intolerance, previously on pravastatin , discontinued on 10/2023   Diastolic CHF  Complete heart block s/p pacemaker placement  PAF on Eliquis   P: - Stable, controlled with pacemaker. Paced rhythm.  - Patient currently on DVT ppx dose of Eliquis , will need to resume home dose once patient is able to swallow.  BPH  P: -Continue home  tamsulosin   History of Dementia  P: - Neuro and delirium checks/precautions   Labs   CBC: Recent Labs  Lab 04/23/24 1050 04/23/24 1109 04/23/24 1609 04/24/24 0241  WBC 12.6*  --  9.9 11.5*  NEUTROABS 11.2*  --   --  9.1*  HGB 13.1 15.0  14.6 12.2* 12.4*  HCT 41.2 44.0  43.0 36.5* 36.3*  MCV 98.3  --  93.8 92.1  PLT 257  --  212 221    Basic Metabolic Panel: Recent Labs  Lab 04/23/24 1050 04/23/24 1109 04/23/24 1609 04/23/24 2111 04/24/24 0241  NA 134* 131*  129* 136 138 136  K 5.4* 5.3*  5.3* 4.2 4.0 4.2  CL 91* 97* 98 100 100  CO2 16*  --  20* 22 27  GLUCOSE 820* >700* 538* 186* 140*  BUN 53* 50* 58* 59* 60*  CREATININE 4.36* 3.80* 4.24* 3.86* 3.83*  CALCIUM 9.1  --  8.8* 9.0 8.7*   GFR: Estimated Creatinine Clearance: 13.1 mL/min (A) (by C-G formula based on SCr of 3.83 mg/dL (H)). Recent Labs  Lab 04/23/24 1050 04/23/24 1110 04/23/24 1348 04/23/24 1609 04/23/24 1804 04/23/24 2111 04/24/24 0241  WBC 12.6*  --   --  9.9  --   --  11.5*  LATICACIDVEN  --  7.6* 5.0*  --  4.9* 4.0*  --     Liver Function Tests: Recent Labs  Lab 04/23/24 1050  AST 52*  ALT 15  ALKPHOS 62  BILITOT 2.9*  PROT 6.9  ALBUMIN 3.3*   Recent Labs  Lab 04/23/24 1609  LIPASE 26   Recent Labs  Lab 04/23/24 1609  AMMONIA 22    ABG    Component Value Date/Time   HCO3 13.9 (L) 04/23/2024 1109   TCO2 14 (L) 04/23/2024 1109   TCO2 15 (L) 04/23/2024 1109   ACIDBASEDEF 14.0 (H) 04/23/2024 1109   O2SAT 71 04/23/2024 1109     Coagulation Profile: Recent Labs  Lab 04/23/24 1050  INR 1.1    Cardiac Enzymes: No results for input(s): CKTOTAL, CKMB, CKMBINDEX, TROPONINI in the last 168 hours.  HbA1C: Hgb A1c MFr Bld  Date/Time Value Ref Range Status  01/27/2024 06:03 AM 9.1 (H) 4.8 - 5.6 % Final    Comment:    (NOTE) Diagnosis of Diabetes The following HbA1c ranges recommended by the American Diabetes Association (ADA) may be used as an aid  in the diagnosis of diabetes mellitus.  Hemoglobin             Suggested A1C NGSP%              Diagnosis  <5.7  Non Diabetic  5.7-6.4                Pre-Diabetic  >6.4                   Diabetic  <7.0                   Glycemic control for                       adults with diabetes.    09/06/2023 03:08 PM 8.7 (H) 4.6 - 6.5 % Final    Comment:    Glycemic Control Guidelines for People with Diabetes:Non Diabetic:  <6%Goal of Therapy: <7%Additional Action Suggested:  >8%     CBG: Recent Labs  Lab 04/23/24 2353 04/24/24 0159 04/24/24 0314 04/24/24 0332 04/24/24 0809  GLUCAP 141* 129* 159* 149* 227*    Past Medical History:  He,  has a past medical history of Acute on chronic diastolic congestive heart failure (HCC), Arthus phenomenon, Atrioventricular block, complete (HCC), Cardiac pacemaker in situ (07/22/2010), Chronic kidney disease, stage 3 (HCC), DIABETES MELLITUS, TYPE I, ADULT ONSET (08/06/2007), DM (diabetes mellitus) (HCC), Essential hypertension, benign (08/06/2007), History of second degree heart block, HLD (hyperlipidemia), HYPERLIPIDEMIA (08/06/2007), Hypopotassemia, Morbid obesity (HCC), Obesity, Other and unspecified angina pectoris, and Type I (juvenile type) diabetes mellitus without mention of complication, not stated as uncontrolled.   Surgical History:   Past Surgical History:  Procedure Laterality Date   CATARACT EXTRACTION  8/08   [place]   PPM GENERATOR CHANGEOUT N/A 02/13/2019   Procedure: PPM GENERATOR CHANGEOUT;  Surgeon: Waddell Danelle ORN, MD;  Location: [facility] INVASIVE CV LAB;  Service: Cardiovascular;  Laterality: N/A;   PTVDP  12/11   PPM - [employer]   RIGHT HEART CATH N/A 12/29/2023   Procedure: RIGHT HEART CATH;  Surgeon: Cherrie Toribio SAUNDERS, MD;  Location: [facility] INVASIVE CV LAB;  Service: Cardiovascular;  Laterality: N/A;   TOTAL HIP ARTHROPLASTY  04/05/06   right     Social History:   reports that he has never smoked. He has never  used smokeless tobacco. He reports that he does not drink alcohol  and does not use drugs.   Family History:  His family history includes Asthma in his father and mother; Coronary artery disease in his father and mother; Diabetes in his father; Heart attack (age of onset: 36) in his mother; Hyperlipidemia in his father; Stroke (age of onset: 36) in his father. There is no history of Colon cancer.   Allergies Allergies  Allergen Reactions   Shellfish Allergy Hives and Swelling   Citrus Hives   Pecan Extract Hives   Apple Other (See Comments)    Sore throat   Cabbage Other (See Comments)    Unknown reaction   Lemon Oil Hives   Walnut Other (See Comments)    Unknown reaction   Farxiga  [Dapagliflozin ] Other (See Comments)    Low blood sugar     Home Medications  Prior to Admission medications   Medication Sig Start Date End Date Taking? Authorizing Provider  acetaminophen  (TYLENOL ) 325 MG tablet Take 650 mg by mouth every 8 (eight) hours as needed (for pain).    [provider]  allopurinol  (ZYLOPRIM ) 100 MG tablet TAKE 1 TABLET(100 MG) BY MOUTH DAILY 09/25/23   [name], MD  apixaban  (ELIQUIS ) 2.5 MG TABS tablet Take 1 tablet (2.5 mg total) by mouth 2 (two) times daily. 08/14/23   Joshua,  Debby CROME, MD  calcitRIOL  (ROCALTROL ) 0.25 MCG capsule TAKE 1 CAPSULE BY MOUTH DAILY RETURN IN ABOUT 6 MONTHS(AROUND 02/04/2023) Patient taking differently: Take 0.25 mcg by mouth in the morning. 08/14/23   [name], MD  cefadroxil  (DURICEF) 500 MG capsule Take 2 capsules (1,000 mg total) by mouth daily. 01/28/24   Jadine Toribio SQUIBB, MD  Cholecalciferol (VITAMIN D3) 1000 units CAPS Take 1,000 Units by mouth in the morning.    [provider]  Continuous Glucose Sensor (DEXCOM G7 SENSOR) MISC 1 Act by Does not apply route daily. Patient taking differently: Inject 1 Device into the skin See admin instructions. Place 1 new sensor into the skin every 10 days 01/06/23   [name], MD  cyanocobalamin  1000 MCG tablet Take 1 tablet (1,000 mcg total) by mouth daily. 11/08/23   Gherghe, Costin M, MD  Dextrose , Diabetic Use, (INSTA-GLUCOSE PO) Take 1 Tube by mouth as needed (for a BGL less than 60 and drink orange juice beforehand, if possible).    [provider]  feeding supplement, GLUCERNA SHAKE, (GLUCERNA SHAKE) LIQD Take 237 mLs by mouth daily. Patient taking differently: Take 237 mLs by mouth See admin instructions. Drink 237 ml's of VANILLA GLUCERNA in the morning 11/07/23   Gherghe, Costin M, MD  ferrous sulfate  325 (65 FE) MG tablet Take 1 tablet (325 mg total) by mouth daily with breakfast. 11/08/23   Trixie Nilda HERO, MD  folic acid  (FOLVITE ) 1 MG tablet Take 1 tablet (1 mg total) by mouth daily. 11/08/23   Gherghe, Costin M, MD  Glucagon  (GVOKE HYPOPEN  2-PACK) 1 MG/0.2ML SOAJ Inject 1 Act into the skin daily as needed. 04/05/22   [name], MD  hydrALAZINE  (APRESOLINE ) 10 MG tablet Take 1 tablet (10 mg total) by mouth 3 (three) times daily. 12/29/23   Fairy Frames, MD  insulin  aspart (NOVOLOG ) 100 UNIT/ML FlexPen Inject 0-6 Units into the skin See admin instructions. Inject 0-6 units into the skin three times a day before meals, per sliding scale: BGL 0-150 = give nothing; 151-190 = 1 unit; 191-230 = 2 units; 231-270 = 3 units; 271-310 = 4 units; 311-350 = 5 units; 351-399 = 6 units; >400 = CALL MD!    [provider]  LANTUS  SOLOSTAR 100 UNIT/ML Solostar Pen Inject 8 Units into the skin at bedtime.    [provider]  levocetirizine (XYZAL ) 5 MG tablet Take 1 tablet (5 mg total) by mouth every evening. 09/06/23   [name], MD  loratadine  (CLARITIN ) 10 MG tablet Take 10 mg by mouth daily as needed for allergies.    [provider]  metoprolol  succinate (TOPROL -XL) 25 MG 24 hr tablet Take 1 tablet (25 mg total) by mouth daily. 12/30/23   Fairy Frames, MD  tamsulosin  (FLOMAX ) 0.4 MG CAPS capsule Take 1 capsule (0.4 mg  total) by mouth daily. 11/08/23   Gherghe, Costin M, MD  torsemide  (DEMADEX ) 20 MG tablet Take 1 tablet (20 mg total) by mouth daily. Give extra dose for weight gain of 3lb in 1 day or 5lb in 1 week Patient taking differently: Take 20 mg by mouth See admin instructions. Take 20 mg by mouth at 8 AM and an additional 20 mg (to equal a total dose of 40 mg) from 01/24/2024 through 01/28/2024 12/30/23   Fairy Frames, MD     Critical care time: 37   Asheton Viramontes, DO Internal Medicine Resident, PGY-1 Please contact the on call pager at  [phone] for any urgent or emergent needs. 10:58 AM 04/24/2024

## 2024-04-25 DIAGNOSIS — J690 Pneumonitis due to inhalation of food and vomit: Secondary | ICD-10-CM

## 2024-04-25 DIAGNOSIS — E1011 Type 1 diabetes mellitus with ketoacidosis with coma: Secondary | ICD-10-CM

## 2024-04-25 DIAGNOSIS — I10 Essential (primary) hypertension: Secondary | ICD-10-CM

## 2024-04-25 DIAGNOSIS — N179 Acute kidney failure, unspecified: Secondary | ICD-10-CM | POA: Diagnosis not present

## 2024-04-25 LAB — GLUCOSE, CAPILLARY
Glucose-Capillary: 100 mg/dL — ABNORMAL HIGH (ref 70–99)
Glucose-Capillary: 113 mg/dL — ABNORMAL HIGH (ref 70–99)
Glucose-Capillary: 142 mg/dL — ABNORMAL HIGH (ref 70–99)
Glucose-Capillary: 176 mg/dL — ABNORMAL HIGH (ref 70–99)
Glucose-Capillary: 260 mg/dL — ABNORMAL HIGH (ref 70–99)
Glucose-Capillary: 98 mg/dL (ref 70–99)

## 2024-04-25 LAB — BASIC METABOLIC PANEL WITH GFR
Anion gap: 13 (ref 5–15)
BUN: 50 mg/dL — ABNORMAL HIGH (ref 8–23)
CO2: 23 mmol/L (ref 22–32)
Calcium: 8.9 mg/dL (ref 8.9–10.3)
Chloride: 108 mmol/L (ref 98–111)
Creatinine, Ser: 2.59 mg/dL — ABNORMAL HIGH (ref 0.61–1.24)
GFR, Estimated: 23 mL/min — ABNORMAL LOW (ref >60)
Glucose, Bld: 158 mg/dL — ABNORMAL HIGH (ref 70–99)
Potassium: 3.9 mmol/L (ref 3.5–5.1)
Sodium: 144 mmol/L (ref 135–145)

## 2024-04-25 LAB — CBC WITH DIFFERENTIAL/PLATELET
Abs Immature Granulocytes: 0.04 K/uL (ref 0.00–0.07)
Basophils Absolute: 0 K/uL (ref 0.0–0.1)
Basophils Relative: 0 %
Eosinophils Absolute: 0.1 K/uL (ref 0.0–0.5)
Eosinophils Relative: 1 %
HCT: 38.4 % — ABNORMAL LOW (ref 39.0–52.0)
Hemoglobin: 12.7 g/dL — ABNORMAL LOW (ref 13.0–17.0)
Immature Granulocytes: 0 %
Lymphocytes Relative: 10 %
Lymphs Abs: 1 K/uL (ref 0.7–4.0)
MCH: 30.8 pg (ref 26.0–34.0)
MCHC: 33.1 g/dL (ref 30.0–36.0)
MCV: 93 fL (ref 80.0–100.0)
Monocytes Absolute: 0.9 K/uL (ref 0.1–1.0)
Monocytes Relative: 9 %
Neutro Abs: 7.7 K/uL (ref 1.7–7.7)
Neutrophils Relative %: 80 %
Platelets: 205 K/uL (ref 150–400)
RBC: 4.13 MIL/uL — ABNORMAL LOW (ref 4.22–5.81)
RDW: 16.6 % — ABNORMAL HIGH (ref 11.5–15.5)
WBC: 9.8 K/uL (ref 4.0–10.5)
nRBC: 0 % (ref 0.0–0.2)

## 2024-04-25 MED ORDER — SENNOSIDES-DOCUSATE SODIUM 8.6-50 MG PO TABS
2.0000 | ORAL_TABLET | Freq: Two times a day (BID) | ORAL | Status: DC
  Administered 2024-04-25 – 2024-04-26 (×3): 2 via ORAL
  Filled 2024-04-25 (×3): qty 2

## 2024-04-25 MED ORDER — APIXABAN 2.5 MG PO TABS
2.5000 mg | ORAL_TABLET | Freq: Two times a day (BID) | ORAL | Status: DC
  Administered 2024-04-25 – 2024-04-26 (×2): 2.5 mg via ORAL
  Filled 2024-04-25 (×2): qty 1

## 2024-04-25 MED ORDER — POLYETHYLENE GLYCOL 3350 17 G PO PACK
17.0000 g | PACK | Freq: Two times a day (BID) | ORAL | Status: DC
  Administered 2024-04-25 – 2024-04-26 (×3): 17 g via ORAL
  Filled 2024-04-25 (×3): qty 1

## 2024-04-25 MED ORDER — FLEET ENEMA RE ENEM
1.0000 | ENEMA | Freq: Once | RECTAL | Status: DC
  Filled 2024-04-25: qty 1

## 2024-04-25 MED ORDER — METOPROLOL SUCCINATE ER 25 MG PO TB24
25.0000 mg | ORAL_TABLET | Freq: Every day | ORAL | Status: DC
  Administered 2024-04-25 – 2024-04-26 (×2): 25 mg via ORAL
  Filled 2024-04-25 (×2): qty 1

## 2024-04-25 NOTE — Evaluation (Signed)
 Clinical/Bedside Swallow Evaluation Patient Details  Name: Connor Dixon MRN: [id number] Date of Birth: Jun 24, 1934  Today's Date: 04/25/2024 Time: SLP Start Time (ACUTE ONLY): 9081 SLP Stop Time (ACUTE ONLY): 0933 SLP Time Calculation (min) (ACUTE ONLY): 15 min  Past Medical History:  Past Medical History:  Diagnosis Date   Acute on chronic diastolic congestive heart failure (HCC)    Arthus phenomenon    Atrioventricular block, complete (HCC)    Cardiac pacemaker in situ 07/22/2010   Chronic kidney disease, stage 3 (HCC)    Presumably from longstanding DM & HTN. Scr has gradually increased over the past 2 years (1.6 in 12/2010, 2 in 03/2011, and up to 2.1 in 01/2013)   DIABETES MELLITUS, TYPE I, ADULT ONSET 08/06/2007   DM (diabetes mellitus) (HCC)    adult onset    Essential hypertension, benign 08/06/2007   Very good BP control   History of second degree heart block    HLD (hyperlipidemia)    HYPERLIPIDEMIA 08/06/2007   Hypopotassemia    Morbid obesity (HCC)    Obesity    Other and unspecified angina pectoris    typical   Type I (juvenile type) diabetes mellitus without mention of complication, not stated as uncontrolled    (04/25/13 OV with Dr. Rayburn) = Poorly controlled & is working  w/Dr. Karlos to improve this. Most recent Hgb A1c was 8.2% but Mr. Smelser reports it was down to 7.6 last month.   Past Surgical History:  Past Surgical History:  Procedure Laterality Date   CATARACT EXTRACTION  8/08   [place]   PPM GENERATOR CHANGEOUT N/A 02/13/2019   Procedure: PPM GENERATOR CHANGEOUT;  Surgeon: Waddell Danelle ORN, MD;  Location: [facility];  Service: Cardiovascular;  Laterality: N/A;   PTVDP  12/11   PPM - [employer]   RIGHT HEART CATH N/A 12/29/2023   Procedure: RIGHT HEART CATH;  Surgeon: Cherrie Toribio SAUNDERS, MD;  Location: [facility];  Service: Cardiovascular;  Laterality: N/A;   TOTAL HIP ARTHROPLASTY  04/05/06   right   HPI:  Pt is an 89 yo male  presenting from SNF 12/9 with AMS. Admitted with severe DKA and sepsis. CT Chest concerning for possible mild LUL PNA. Pt has had two previous evaluations by SLP during other hospital stays this year, both times exhibiting what was presumed to be a cognitively-based oral dysphagia. Softer diets were recommended both admissions (purees in May/June 2025, mechanical soft in September 2025). PMH includes: dementia with reportedly fluctuating mentation since May 2025, HTN, HLD, CKD,  complete heart block s/p pacemaker, diastolic CHF, type 1 diabetes    Assessment / Plan / Recommendation  Clinical Impression  Pt presents with what appears to be a cognitively-based oral dysphagia. Given this and his impulsivity, recommend starting with Dys 1 (puree) diet and thin liquids. SLP will follow for potential to advance.  Pt has a L facial droop that was not noted in SLP swallow eval in September of this year, but per PCCM MD notes, this is considered to be chronic. CT Head also negative for acute findings (no MRI completed). Pt has difficulty responding to questions and following commands but does engage in PO trials quite impulsively. He had a single delayed cough without any other signs concerning for possible aspiration. Pt was able to better obtain liquids via cup compared to when attempting the straw. When he tried to use a straw it would inevitably go toward his L side, even with SLP  actively trying to keep it on his R side, and he had reduced seal and suck. He would likely do better drinking from a cup, although do not necessarily think he needs to avoid straws from a safety perspective. Pt's mastication is limited and he tries to put food in his mouth before clearing what is already there. It sounds like he is typically on mechanical soft, but would start softer this admission.   SLP Visit Diagnosis: Dysphagia, unspecified (R13.10)    Other Recommendations Oral Care Recommendations: Oral care BID     Swallow  Evaluation Recommendations Recommendations: PO diet PO Diet Recommendation: Dysphagia 1 (Pureed);Thin liquids (Level 0) Liquid Administration via: Cup;Straw (may prefer cup) Medication Administration: Crushed with puree Supervision: Staff to assist with self-feeding;Full supervision/cueing for swallowing strategies Swallowing strategies  : Minimize environmental distractions;Slow rate;Small bites/sips Postural changes: Position pt fully upright for meals Oral care recommendations: Oral care BID (2x/day)   Assistance Recommended at Discharge    Functional Status Assessment Patient has had a recent decline in their functional status and demonstrates the ability to make significant improvements in function in a reasonable and predictable amount of time.  Frequency and Duration min 2x/week  2 weeks       Prognosis Prognosis for improved oropharyngeal function: Good Barriers to Reach Goals: Cognitive deficits      Swallow Study   General HPI: Pt is an 89 yo male presenting from SNF 12/9 with AMS. Admitted with severe DKA and sepsis. CT Chest concerning for possible mild LUL PNA. Pt has had two previous evaluations by SLP during other hospital stays this year, both times exhibiting what was presumed to be a cognitively-based oral dysphagia. Softer diets were recommended both admissions (purees in May/June 2025, mechanical soft in September 2025). PMH includes: dementia with reportedly fluctuating mentation since May 2025, HTN, HLD, CKD,  complete heart block s/p pacemaker, diastolic CHF, type 1 diabetes Type of Study: Bedside Swallow Evaluation Previous Swallow Assessment: see HPI Diet Prior to this Study: NPO Temperature Spikes Noted: No Respiratory Status: Room air History of Recent Intubation: No Behavior/Cognition: Alert;Cooperative;Requires cueing Oral Cavity Assessment: Within Functional Limits Oral Care Completed by SLP: No Oral Cavity - Dentition: Missing dentition Vision:  Functional for self-feeding Self-Feeding Abilities: Able to feed self Patient Positioning: Upright in bed Baseline Vocal Quality: Normal Volitional Cough: Cognitively unable to elicit Volitional Swallow: Unable to elicit    Oral/Motor/Sensory Function Overall Oral Motor/Sensory Function:  (difficulty following commands - has L facial droop that per chart is chronic)   Ice Chips Ice chips: Not tested   Thin Liquid Thin Liquid: Impaired Presentation: Self Fed;Cup;Straw Pharyngeal  Phase Impairments: Cough - Delayed    Nectar Thick Nectar Thick Liquid: Not tested   Honey Thick Honey Thick Liquid: Not tested   Puree Puree: Within functional limits Presentation: Spoon   Solid     Solid: Impaired Oral Phase Impairments: Impaired mastication      Leita SAILOR., M.A. CCC-SLP Acute Rehabilitation Services Office: [phone]  Secure chat preferred  04/25/2024,9:40 AM

## 2024-04-25 NOTE — Telephone Encounter (Signed)
 PCP removed.

## 2024-04-25 NOTE — Progress Notes (Signed)
 [facility]                                                                               Connor Dixon, is a 89 y.o. male, DOB - [birth year], FMW:[id number] Admit date - 04/23/2024    Outpatient Primary MD for the patient is [name], MD  LOS - 2  days    Brief summary    89 year old male with past medical history of hypertension, hyperlipidemia, CKD III, complete heart block s/p pacemaker, diastolic CHF, type 1 diabetes who presents to the ED for altered mental status from his SNF at [place] . Found to be hyperglycemic, hypotensive, altered, and having urinary retention for an unknown amount of time per EMS.  He was admitted for DKA, Started on insulin  gtt. CCM was consulted in setting of encephalopathy and DKA.   Assessment & Plan    Assessment and Plan:  Sever DKA Resolved.  Currently on sq insulin .  CBG (last 3)  Recent Labs    04/25/24 0800 04/25/24 1225 04/25/24 1657  GLUCAP 98 142* 176*   Resume SSI.  SLP eval recommending dysphagia 1 diet.     Sepsis ruled out.  Empirically started on broad spectrum IV antibiotics.  Transitioned to unasyn  for possible aspiration pneumonia.    Acute metabolic encephalopathy sec to DKA In the setting of vascular dementia and behavioral disturbances.   Patient is more awake and conversing.  Son would like the patient to be transferred back to ALF as soon as possible.    AKI superimposed on stage 3b CKD Hyperkalemia AG metabolic acidosis.  Creatinine slowly improving.    Hypertension Well controlled.    Chronic diastolic CHF CHB s/p PPM PAF on eliquis .  Restarted eliquis .     Constipation:  Moderate stool burden.  Fleet enema ordered.  Bowel regimen with senna, colace and miralax  BID.       Estimated body mass index is 26.31 kg/m as calculated from the following:   Height as of 02/16/24: 5' 8 (1.727 m).   Weight as of this encounter: 78.5 kg.  Code Status: full code.  DVT  Prophylaxis:  heparin  injection 5,000 Units Start: 04/23/24 1400 SCDs Start: 04/23/24 1353   Level of Care: Level of care: Med-Surg Family Communication: discussed with son over the phone.   Disposition Plan:     Remains inpatient appropriate:  pending.   Procedures:  None.   Consultants:   None.   Antimicrobials:   Anti-infectives (From admission, onward)    Start     Dose/Rate Route Frequency Ordered Stop   04/24/24 1215  Ampicillin -Sulbactam (UNASYN ) 3 g in sodium chloride  0.9 % 100 mL IVPB        3 g 200 mL/hr over 30 Minutes Intravenous Every 12 hours 04/24/24 1121 04/28/24 1214   04/23/24 1630  piperacillin -tazobactam (ZOSYN ) IVPB 2.25 g  Status:  Discontinued        2.25 g 100 mL/hr over 30 Minutes Intravenous Every 8 hours 04/23/24 1540 04/24/24 1121   04/23/24 1533  vancomycin  variable dose per unstable renal function (pharmacist dosing)  Status:  Discontinued         Does not  apply See admin instructions 04/23/24 1533 04/24/24 1121   04/23/24 1100  vancomycin  (VANCOREADY) IVPB 2000 mg/400 mL        2,000 mg 200 mL/hr over 120 Minutes Intravenous  Once 04/23/24 1048 04/23/24 1415   04/23/24 1045  ceFEPIme  (MAXIPIME ) 2 g in sodium chloride  0.9 % 100 mL IVPB        2 g 200 mL/hr over 30 Minutes Intravenous  Once 04/23/24 1041 04/23/24 1145   04/23/24 1045  vancomycin  (VANCOCIN ) IVPB 1000 mg/200 mL premix  Status:  Discontinued        1,000 mg 200 mL/hr over 60 Minutes Intravenous  Once 04/23/24 1041 04/23/24 1048        Medications  Scheduled Meds:  Chlorhexidine  Gluconate Cloth  6 each Topical Daily   folic acid   1 mg Intravenous Daily   heparin   5,000 Units Subcutaneous Q8H   insulin  aspart  0-20 Units Subcutaneous Q4H   insulin  glargine  10 Units Subcutaneous BID   mouth rinse  15 mL Mouth Rinse 4 times per day   Continuous Infusions:  ampicillin -sulbactam (UNASYN ) IV 3 g (04/25/24 0020)   PRN Meds:.dextrose , docusate sodium , ondansetron  (ZOFRAN ) IV,  mouth rinse, polyethylene glycol    Subjective:   Connor Dixon was seen and examined today.  No new events. He is more alert today.   Objective:   Vitals:   04/24/24 1806 04/24/24 2213 04/24/24 2359 04/25/24 0507  BP: (!) 120/91 126/81 114/85 (!) 143/90  Pulse: 95 96 92 100  Resp:      Temp:  98.5 F (36.9 C) 98.5 F (36.9 C) 98.4 F (36.9 C)  TempSrc:  Oral  Oral  SpO2: 97% 100% 100% 97%  Weight:       No intake or output data in the 24 hours ending 04/25/24 1313 Filed Weights   04/23/24 1035 04/23/24 1600 04/24/24 0500  Weight: 81.1 kg 76.9 kg 78.5 kg     Exam General exam: Ill appearing elderly gentleman , not in distress.  Respiratory system: air entry fair.  Cardiovascular system: S1 & S2 heard, RRR. Gastrointestinal system: Abdomen is soft bs+ Central nervous system: Alert , not in distress. Oriented to person only.  Extremities: no edema.  Skin: No rashes Psychiatry: No agitation.    Data Reviewed:  I have personally reviewed following labs and imaging studies   CBC Lab Results  Component Value Date   WBC 11.5 (H) 04/24/2024   RBC 3.94 (L) 04/24/2024   HGB 12.4 (L) 04/24/2024   HCT 36.3 (L) 04/24/2024   MCV 92.1 04/24/2024   MCH 31.5 04/24/2024   PLT 221 04/24/2024   MCHC 34.2 04/24/2024   RDW 16.1 (H) 04/24/2024   LYMPHSABS 0.8 04/24/2024   MONOABS 1.6 (H) 04/24/2024   EOSABS 0.0 04/24/2024   BASOSABS 0.0 04/24/2024     Last metabolic panel Lab Results  Component Value Date   NA 136 04/24/2024   K 4.2 04/24/2024   CL 100 04/24/2024   CO2 27 04/24/2024   BUN 60 (H) 04/24/2024   CREATININE 3.83 (H) 04/24/2024   GLUCOSE 140 (H) 04/24/2024   GFRNONAA 15 (L) 04/24/2024   GFRAA 32 (L) 12/10/2019   CALCIUM 8.7 (L) 04/24/2024   PHOS 2.2 (L) 10/21/2023   PROT 6.9 04/23/2024   ALBUMIN 3.3 (L) 04/23/2024   LABGLOB 4.7 (H) 10/18/2023   AGRATIO 0.5 (L) 10/18/2023   BILITOT 2.9 (H) 04/23/2024   ALKPHOS 62 04/23/2024   AST 52 (H) 04/23/2024  ALT 15 04/23/2024   ANIONGAP 9 04/24/2024    CBG (last 3)  Recent Labs    04/25/24 0505 04/25/24 0800 04/25/24 1225  GLUCAP 100* 98 142*      Coagulation Profile: Recent Labs  Lab 04/23/24 1050  INR 1.1     Radiology Studies: CT CHEST ABDOMEN PELVIS WO CONTRAST Result Date: 04/23/2024 CLINICAL DATA:  Sepsis altered EXAM: CT CHEST, ABDOMEN AND PELVIS WITHOUT CONTRAST TECHNIQUE: Multidetector CT imaging of the chest, abdomen and pelvis was performed following the standard protocol without IV contrast. RADIATION DOSE REDUCTION: This exam was performed according to the departmental dose-optimization program which includes automated exposure control, adjustment of the mA and/or kV according to patient size and/or use of iterative reconstruction technique. COMPARISON:  CT 01/25/2024, 10/14/2023 FINDINGS: CT CHEST FINDINGS Cardiovascular: Limited without intravenous contrast. Left-sided multi lead pacing device. Aortic atherosclerosis without aneurysm. Multi vessel coronary vascular calcification. Normal cardiac size. No pericardial effusion Mediastinum/Nodes: Patent trachea. No thyroid  mass. Small gas bubbles in the left jugular vein presumably due to line placement. No suspicious lymph nodes. Esophagus within normal limits. Lungs/Pleura: Few small foci of nodular airspace disease and ground-glass density in the left upper lobe, series 6, image 63 through 67, lingula, series 6, image 77, and mild peribronchovascular nodularity in the left lower lobe, series 6, image 110. Mild focus of consolidation in the medial left base. Musculoskeletal: Sternum appears intact. No acute osseous abnormality. Stable to decreased size of ovoid nodular density in the left breast. CT ABDOMEN PELVIS FINDINGS Hepatobiliary: Poorly visible gallbladder, either severely contracted or absent. No biliary dilatation. Pancreas: Unremarkable. No pancreatic ductal dilatation or surrounding inflammatory changes. Spleen: Normal  in size without focal abnormality. Adrenals/Urinary Tract: Adrenal glands are stable in appearance. There is no hydronephrosis. The bladder contains Foley catheter and appears thick walled. Small gas within the bladder lumen. Stomach/Bowel: Moderate fluid distension of the stomach. No dilated small bowel. Moderate stool in the colon with large fecal impaction at the rectum, rectum distended up to 9 cm. No acute bowel wall thickening Vascular/Lymphatic: Moderate severe atherosclerosis. No aneurysm. No suspicious lymph nodes. Reproductive: Enlarged prostate Other: No ascites or free air Musculoskeletal: Right hip replacement with artifact. No acute osseous abnormality. Abnormal asymmetrical edema within the soft tissues of the left chest wall, axilla, and included portion of the left upper extremity. IMPRESSION: 1. Few small foci of nodular airspace disease and ground-glass density in the left upper lobe/lingula, and left lower lobe, suspect for mild pneumonia. 2. Moderate stool in the colon with large fecal impaction at the rectum. Rectal distension up to 9 cm without definitive rectal wall thickening. 3. Marked circumferential thick-walled urinary bladder with Foley catheter in place. 4. Abnormal asymmetrical edema within the soft tissues of the left chest wall, axilla, and included portion of the left upper extremity. Further assessment limited without contrast. Correlate for any signs or symptoms of infection/cellulitis 5. Aortic atherosclerosis. Aortic Atherosclerosis (ICD10-I70.0). Electronically Signed   By: Luke Bun M.D.   On: 04/23/2024 18:01   CT Head Wo Contrast Result Date: 04/23/2024 CLINICAL DATA:  Delirium EXAM: CT HEAD WITHOUT CONTRAST TECHNIQUE: Contiguous axial images were obtained from the base of the skull through the vertex without intravenous contrast. RADIATION DOSE REDUCTION: This exam was performed according to the departmental dose-optimization program which includes automated  exposure control, adjustment of the mA and/or kV according to patient size and/or use of iterative reconstruction technique. COMPARISON:  CT brain 12/26/2023, MRI 10/17/2023 FINDINGS: Brain: No acute  territorial infarction, hemorrhage, or intracranial mass. There are chronic appearing lacunar infarcts in the bilateral basal ganglia and right periventricular white matter. Atrophy and moderate chronic small vessel ischemic changes of the white matter. Stable ventricle size Vascular: No hyperdense vessels. Vertebral and carotid vascular calcification Skull: Normal. Negative for fracture or focal lesion. Sinuses/Orbits: No acute finding. Other: None IMPRESSION: 1. No CT evidence for acute intracranial abnormality. 2. Atrophy and chronic small vessel ischemic changes of the white matter. Chronic appearing lacunar infarcts in the bilateral basal ganglia and right periventricular white matter. Electronically Signed   By: Luke Bun M.D.   On: 04/23/2024 17:43       Elgie Butter M.D. [facility] 04/25/2024, 1:13 PM  Available via Epic secure chat 7am-7pm After 7 pm, please refer to night coverage provider listed on amion.

## 2024-04-26 DIAGNOSIS — E1011 Type 1 diabetes mellitus with ketoacidosis with coma: Secondary | ICD-10-CM | POA: Diagnosis not present

## 2024-04-26 DIAGNOSIS — I10 Essential (primary) hypertension: Secondary | ICD-10-CM | POA: Diagnosis not present

## 2024-04-26 LAB — GLUCOSE, CAPILLARY
Glucose-Capillary: 118 mg/dL — ABNORMAL HIGH (ref 70–99)
Glucose-Capillary: 197 mg/dL — ABNORMAL HIGH (ref 70–99)
Glucose-Capillary: 229 mg/dL — ABNORMAL HIGH (ref 70–99)
Glucose-Capillary: 243 mg/dL — ABNORMAL HIGH (ref 70–99)
Glucose-Capillary: 67 mg/dL — ABNORMAL LOW (ref 70–99)
Glucose-Capillary: 72 mg/dL (ref 70–99)

## 2024-04-26 MED ORDER — INSULIN GLARGINE 100 UNIT/ML ~~LOC~~ SOLN
7.0000 [IU] | Freq: Two times a day (BID) | SUBCUTANEOUS | Status: DC
  Filled 2024-04-26: qty 0.07

## 2024-04-26 MED ORDER — INSULIN ASPART 100 UNIT/ML IJ SOLN
0.0000 [IU] | Freq: Three times a day (TID) | INTRAMUSCULAR | Status: DC
  Administered 2024-04-26: 2 [IU] via SUBCUTANEOUS
  Administered 2024-04-26: 3 [IU] via SUBCUTANEOUS
  Filled 2024-04-26: qty 2
  Filled 2024-04-26: qty 3

## 2024-04-26 MED ORDER — DOXYCYCLINE HYCLATE 100 MG PO TABS: 100.0000 mg | ORAL_TABLET | Freq: Two times a day (BID) | ORAL | 0 refills | Status: AC

## 2024-04-26 MED ORDER — POLYETHYLENE GLYCOL 3350 17 G PO PACK: 17.0000 g | PACK | Freq: Two times a day (BID) | ORAL | 0 refills | Status: DC

## 2024-04-26 MED ORDER — AMOXICILLIN-POT CLAVULANATE 250-62.5 MG/5ML PO SUSR: 500.0000 mg | Freq: Two times a day (BID) | ORAL | 0 refills | Status: DC

## 2024-04-26 MED ORDER — INSULIN GLARGINE 100 UNIT/ML ~~LOC~~ SOLN: 7.0000 [IU] | Freq: Two times a day (BID) | SUBCUTANEOUS | 11 refills | Status: DC

## 2024-04-26 MED ORDER — SENNOSIDES-DOCUSATE SODIUM 8.6-50 MG PO TABS: 2.0000 | ORAL_TABLET | Freq: Two times a day (BID) | ORAL | Status: DC

## 2024-04-26 NOTE — Discharge Summary (Addendum)
 Physician Discharge Summary   Patient: Connor Dixon MRN: [id number] DOB: 1934-07-18  Admit date:     04/23/2024  Discharge date: 04/26/2024  Discharge Physician: Elgie Butter   PCP: No primary care provider on file.   Recommendations at discharge:  Please follow up with PCP in one week.    Discharge Diagnoses: Principal Problem:   Diabetic ketoacidosis (HCC)  Resolved Problems:   * No resolved hospital problems. *  Hospital Course: 89 year old male with past medical history of hypertension, hyperlipidemia, CKD III, complete heart block s/p pacemaker, diastolic CHF, type 1 diabetes who presents to the ED for altered mental status from his SNF at [facility] . Found to be hyperglycemic, hypotensive, altered, and having urinary retention for an unknown amount of time per EMS.  He was admitted for DKA, Started on insulin  gtt. CCM was consulted in setting of encephalopathy and DKA.   Assessment and Plan:  Sever DKA Resolved.  Currently on sq insulin . Decreased the dose of long acting insulin  to 7 units BID as he had an episode of hypoglycemia with 10 units BID.  Resume SSI on discharge.  SLP eval recommending dysphagia 1 diet. He will need to be fed with every meals.  CBG (last 3)  Recent Labs    04/26/24 0548 04/26/24 0739 04/26/24 1151  GLUCAP 72 118* 197*          Sepsis ruled out.  Empirically started on broad spectrum IV antibiotics.  Transitioned to unasyn  for possible aspiration pneumonia. Patient completed 4 days of unasyn , he is being discharged on one more day of doxycycline to complete the course.      Acute metabolic encephalopathy sec to DKA In the setting of vascular dementia and behavioral disturbances.    Patient is more awake and conversing.  Son would like the patient to be transferred back to ALF as soon as possible.      AKI superimposed on stage 3b CKD Hyperkalemia AG metabolic acidosis.  Creatinine appears to be back to baseline.       Hypertension Slightly elevated BP parameters.   restarted home meds on discharge.    Chronic diastolic CHF CHB s/p PPM PAF on eliquis .  Restarted eliquis .        Constipation:  Moderate stool burden.   Bowel regimen with senna, and miralax  BID.      Blood culture positive for staph epidermidis Possibly a contaminant.       Estimated body mass index is 26.31 kg/m as calculated from the following:   Height as of 02/16/24: 5' 8 (1.727 m).   Weight as of this encounter: 78.5 kg.     Consultants: PCCM Procedures performed: none.   Disposition: Long term care facility Diet recommendation:  Dysphagia 1 diet.  DISCHARGE MEDICATION: Allergies as of 04/26/2024       Reactions   Shellfish Allergy Hives, Swelling   Citrus Hives   Pecan Extract Hives   Apple Other (See Comments)   Sore throat   Cabbage Other (See Comments)   Unknown reaction   Lemon Oil Hives   Walnut Other (See Comments)   Unknown reaction   Farxiga  [dapagliflozin ] Other (See Comments)   Low blood sugar        Medication List     STOP taking these medications    Lantus  SoloStar 100 UNIT/ML Solostar Pen Generic drug: insulin  glargine Replaced by: insulin  glargine 100 UNIT/ML injection       TAKE these medications    acetaminophen  325  MG tablet Commonly known as: TYLENOL  Take 650 mg by mouth every 8 (eight) hours as needed (pain).   allopurinol  100 MG tablet Commonly known as: ZYLOPRIM  TAKE 1 TABLET(100 MG) BY MOUTH DAILY   apixaban  2.5 MG Tabs tablet Commonly known as: Eliquis  Take 1 tablet (2.5 mg total) by mouth 2 (two) times daily.   calcitRIOL  0.25 MCG capsule Commonly known as: ROCALTROL  TAKE 1 CAPSULE BY MOUTH DAILY RETURN IN ABOUT 6 MONTHS(AROUND 02/04/2023)   cyanocobalamin  1000 MCG tablet Take 1 tablet (1,000 mcg total) by mouth daily.   Dexcom G7 Sensor Misc 1 Act by Does not apply route daily. What changed:  how much to take how to take this when to take  this additional instructions   doxycycline 100 MG tablet Commonly known as: VIBRA-TABS Take 1 tablet (100 mg total) by mouth 2 (two) times daily for 1 day. Start taking on: April 27, 2024   feeding supplement (GLUCERNA SHAKE) Liqd Take 237 mLs by mouth daily. What changed:  when to take this additional instructions   ferrous sulfate  325 (65 FE) MG tablet Take 1 tablet (325 mg total) by mouth daily with breakfast.   folic acid  1 MG tablet Commonly known as: FOLVITE  Take 1 tablet (1 mg total) by mouth daily.   Glucose 15 GM/32ML Gel Take 15 g by mouth as needed (BS < 60).   hydrALAZINE  10 MG tablet Commonly known as: APRESOLINE  Take 1 tablet (10 mg total) by mouth 3 (three) times daily.   insulin  aspart 100 UNIT/ML FlexPen Commonly known as: NOVOLOG  Inject 3-9 Units into the skin See admin instructions. Inject 3 units into the skin three times daily with meals IN ADDITION to 0-6 units per sliding scale: 000 - 150 : 0 unit 151 - 190 : 1 unit 191 - 230 : 2 units 231 - 270 : 3 units 271 - 310 : 4 units 311 - 350 : 5 units 351 - 399 : 6 units >= 400 : notify MD   insulin  glargine 100 UNIT/ML injection Commonly known as: LANTUS  Inject 0.07 mLs (7 Units total) into the skin 2 (two) times daily. Replaces: Lantus  SoloStar 100 UNIT/ML Solostar Pen   levocetirizine 5 MG tablet Commonly known as: XYZAL  Take 1 tablet (5 mg total) by mouth every evening.   loratadine  10 MG tablet Commonly known as: CLARITIN  Take 10 mg by mouth daily as needed for allergies.   metoprolol  succinate 25 MG 24 hr tablet Commonly known as: TOPROL -XL Take 1 tablet (25 mg total) by mouth daily.   polyethylene glycol 17 g packet Commonly known as: MIRALAX  / GLYCOLAX  Take 17 g by mouth 2 (two) times daily.   senna-docusate 8.6-50 MG tablet Commonly known as: Senokot-S Take 2 tablets by mouth 2 (two) times daily.   tamsulosin  0.4 MG Caps capsule Commonly known as: FLOMAX  Take 1 capsule  (0.4 mg total) by mouth daily.   torsemide  20 MG tablet Commonly known as: DEMADEX  Take 1 tablet (20 mg total) by mouth daily. Give extra dose for weight gain of 3lb in 1 day or 5lb in 1 week What changed:  when to take this additional instructions   Vitamin D3 1000 units Caps Take 1,000 Units by mouth daily.        Discharge Exam: Filed Weights   04/23/24 1600 04/24/24 0500 04/26/24 0500  Weight: 76.9 kg 78.5 kg 78.9 kg   General exam: Appears calm and comfortable  Respiratory system: Clear to auscultation. Respiratory effort normal.  Cardiovascular system: S1 & S2 heard, RRR Gastrointestinal system: Abdomen is nondistended, soft and nontender.  Central nervous system: Alert and oriented to person Extremities: no cyanosis.  Skin: No rashes,     Condition at discharge: fair  The results of significant diagnostics from this hospitalization (including imaging, microbiology, ancillary and laboratory) are listed below for reference.   Imaging Studies: CT CHEST ABDOMEN PELVIS WO CONTRAST Result Date: 04/23/2024 CLINICAL DATA:  Sepsis altered EXAM: CT CHEST, ABDOMEN AND PELVIS WITHOUT CONTRAST TECHNIQUE: Multidetector CT imaging of the chest, abdomen and pelvis was performed following the standard protocol without IV contrast. RADIATION DOSE REDUCTION: This exam was performed according to the departmental dose-optimization program which includes automated exposure control, adjustment of the mA and/or kV according to patient size and/or use of iterative reconstruction technique. COMPARISON:  CT 01/25/2024, 10/14/2023 FINDINGS: CT CHEST FINDINGS Cardiovascular: Limited without intravenous contrast. Left-sided multi lead pacing device. Aortic atherosclerosis without aneurysm. Multi vessel coronary vascular calcification. Normal cardiac size. No pericardial effusion Mediastinum/Nodes: Patent trachea. No thyroid  mass. Small gas bubbles in the left jugular vein presumably due to line  placement. No suspicious lymph nodes. Esophagus within normal limits. Lungs/Pleura: Few small foci of nodular airspace disease and ground-glass density in the left upper lobe, series 6, image 63 through 67, lingula, series 6, image 77, and mild peribronchovascular nodularity in the left lower lobe, series 6, image 110. Mild focus of consolidation in the medial left base. Musculoskeletal: Sternum appears intact. No acute osseous abnormality. Stable to decreased size of ovoid nodular density in the left breast. CT ABDOMEN PELVIS FINDINGS Hepatobiliary: Poorly visible gallbladder, either severely contracted or absent. No biliary dilatation. Pancreas: Unremarkable. No pancreatic ductal dilatation or surrounding inflammatory changes. Spleen: Normal in size without focal abnormality. Adrenals/Urinary Tract: Adrenal glands are stable in appearance. There is no hydronephrosis. The bladder contains Foley catheter and appears thick walled. Small gas within the bladder lumen. Stomach/Bowel: Moderate fluid distension of the stomach. No dilated small bowel. Moderate stool in the colon with large fecal impaction at the rectum, rectum distended up to 9 cm. No acute bowel wall thickening Vascular/Lymphatic: Moderate severe atherosclerosis. No aneurysm. No suspicious lymph nodes. Reproductive: Enlarged prostate Other: No ascites or free air Musculoskeletal: Right hip replacement with artifact. No acute osseous abnormality. Abnormal asymmetrical edema within the soft tissues of the left chest wall, axilla, and included portion of the left upper extremity. IMPRESSION: 1. Few small foci of nodular airspace disease and ground-glass density in the left upper lobe/lingula, and left lower lobe, suspect for mild pneumonia. 2. Moderate stool in the colon with large fecal impaction at the rectum. Rectal distension up to 9 cm without definitive rectal wall thickening. 3. Marked circumferential thick-walled urinary bladder with Foley catheter  in place. 4. Abnormal asymmetrical edema within the soft tissues of the left chest wall, axilla, and included portion of the left upper extremity. Further assessment limited without contrast. Correlate for any signs or symptoms of infection/cellulitis 5. Aortic atherosclerosis. Aortic Atherosclerosis (ICD10-I70.0). Electronically Signed   By: Luke Bun M.D.   On: 04/23/2024 18:01   CT Head Wo Contrast Result Date: 04/23/2024 CLINICAL DATA:  Delirium EXAM: CT HEAD WITHOUT CONTRAST TECHNIQUE: Contiguous axial images were obtained from the base of the skull through the vertex without intravenous contrast. RADIATION DOSE REDUCTION: This exam was performed according to the departmental dose-optimization program which includes automated exposure control, adjustment of the mA and/or kV according to patient size and/or use of iterative reconstruction technique. COMPARISON:  CT brain 12/26/2023, MRI 10/17/2023 FINDINGS: Brain: No acute territorial infarction, hemorrhage, or intracranial mass. There are chronic appearing lacunar infarcts in the bilateral basal ganglia and right periventricular white matter. Atrophy and moderate chronic small vessel ischemic changes of the white matter. Stable ventricle size Vascular: No hyperdense vessels. Vertebral and carotid vascular calcification Skull: Normal. Negative for fracture or focal lesion. Sinuses/Orbits: No acute finding. Other: None IMPRESSION: 1. No CT evidence for acute intracranial abnormality. 2. Atrophy and chronic small vessel ischemic changes of the white matter. Chronic appearing lacunar infarcts in the bilateral basal ganglia and right periventricular white matter. Electronically Signed   By: Luke Bun M.D.   On: 04/23/2024 17:43   DG Chest Port 1 View Result Date: 04/23/2024 CLINICAL DATA:  Altered mental status.  Hypotensive. EXAM: PORTABLE CHEST 1 VIEW COMPARISON:  01/25/2024. FINDINGS: Defibrillator pad overlies the left chest. The heart size and  mediastinal contours are unchanged. Stable left-sided pacemaker. Bronchovascular crowding versus pulmonary vascular congestion. No focal consolidation, pleural effusion, or pneumothorax. No acute osseous abnormality. IMPRESSION: Low lung volumes with bronchovascular crowding versus pulmonary vascular congestion. Electronically Signed   By: Harrietta Sherry M.D.   On: 04/23/2024 12:29    Microbiology: Results for orders placed or performed during the hospital encounter of 04/23/24  Resp panel by RT-PCR (RSV, Flu A&B, Covid) Anterior Nasal Swab     Status: None   Collection Time: 04/23/24 10:41 AM   Specimen: Anterior Nasal Swab  Result Value Ref Range Status   SARS Coronavirus 2 by RT PCR NEGATIVE NEGATIVE Final   Influenza A by PCR NEGATIVE NEGATIVE Final   Influenza B by PCR NEGATIVE NEGATIVE Final    Comment: (NOTE) The Xpert Xpress SARS-CoV-2/FLU/RSV plus assay is intended as an aid in the diagnosis of influenza from Nasopharyngeal swab specimens and should not be used as a sole basis for treatment. Nasal washings and aspirates are unacceptable for Xpert Xpress SARS-CoV-2/FLU/RSV testing.  Fact Sheet for Patients: bloggercourse.com  Fact Sheet for Healthcare Providers: seriousbroker.it  This test is not yet approved or cleared by the [place]  FDA and has been authorized for detection and/or diagnosis of SARS-CoV-2 by FDA under an Emergency Use Authorization (EUA). This EUA will remain in effect (meaning this test can be used) for the duration of the COVID-19 declaration under Section 564(b)(1) of the Act, 21 U.S.C. section 360bbb-3(b)(1), unless the authorization is terminated or revoked.     Resp Syncytial Virus by PCR NEGATIVE NEGATIVE Final    Comment: (NOTE) Fact Sheet for Patients: bloggercourse.com  Fact Sheet for Healthcare Providers: seriousbroker.it  This test  is not yet approved or cleared by the [place]  FDA and has been authorized for detection and/or diagnosis of SARS-CoV-2 by FDA under an Emergency Use Authorization (EUA). This EUA will remain in effect (meaning this test can be used) for the duration of the COVID-19 declaration under Section 564(b)(1) of the Act, 21 U.S.C. section 360bbb-3(b)(1), unless the authorization is terminated or revoked.  Performed at [facility], [address], [place], [place] [address]   Blood Culture (routine x 2)     Status: Abnormal (Preliminary result)   Collection Time: 04/23/24 10:41 AM   Specimen: BLOOD RIGHT ARM  Result Value Ref Range Status   Specimen Description BLOOD RIGHT ARM  Final   Special Requests   Final    BOTTLES DRAWN AEROBIC AND ANAEROBIC Blood Culture adequate volume   Culture  Setup Time   Final  GRAM POSITIVE COCCI IN CLUSTERS IN BOTH AEROBIC AND ANAEROBIC BOTTLES CRITICAL RESULT CALLED TO, READ BACK BY AND VERIFIED WITH: [facility] PHARMD  04/24/2024 @ 0515 BY DD    Culture (A)  Final    STAPHYLOCOCCUS WARNERI STAPHYLOCOCCUS HOMINIS CULTURE REINCUBATED FOR BETTER GROWTH THE SIGNIFICANCE OF ISOLATING THIS ORGANISM FROM A SINGLE SET OF BLOOD CULTURES WHEN MULTIPLE SETS ARE DRAWN IS UNCERTAIN. PLEASE NOTIFY THE MICROBIOLOGY DEPARTMENT WITHIN ONE WEEK IF SPECIATION AND SENSITIVITIES ARE REQUIRED. Performed at [facility], [address], [place], [place] [address]    Report Status PENDING  Incomplete  Blood Culture ID Panel (Reflexed)     Status: Abnormal   Collection Time: 04/23/24 10:41 AM  Result Value Ref Range Status   Enterococcus faecalis NOT DETECTED NOT DETECTED Final   Enterococcus Faecium NOT DETECTED NOT DETECTED Final   Listeria monocytogenes NOT DETECTED NOT DETECTED Final   Staphylococcus species DETECTED (A) NOT DETECTED Final    Comment: CRITICAL RESULT CALLED TO, READ BACK BY AND VERIFIED WITH: [facility] PHARMD  04/24/2024 @ 0515 BY DD     Staphylococcus aureus (BCID) NOT DETECTED NOT DETECTED Final   Staphylococcus epidermidis DETECTED (A) NOT DETECTED Final    Comment: Methicillin (oxacillin) resistant coagulase negative staphylococcus. Possible blood culture contaminant (unless isolated from more than one blood culture draw or clinical case suggests pathogenicity). No antibiotic treatment is indicated for blood  culture contaminants. CRITICAL RESULT CALLED TO, READ BACK BY AND VERIFIED WITH: [facility] PHARMD  04/24/2024 @ 0515 BY DD    Staphylococcus lugdunensis NOT DETECTED NOT DETECTED Final   Streptococcus species NOT DETECTED NOT DETECTED Final   Streptococcus agalactiae NOT DETECTED NOT DETECTED Final   Streptococcus pneumoniae NOT DETECTED NOT DETECTED Final   Streptococcus pyogenes NOT DETECTED NOT DETECTED Final   A.calcoaceticus-baumannii NOT DETECTED NOT DETECTED Final   Bacteroides fragilis NOT DETECTED NOT DETECTED Final   Enterobacterales NOT DETECTED NOT DETECTED Final   Enterobacter cloacae complex NOT DETECTED NOT DETECTED Final   Escherichia coli NOT DETECTED NOT DETECTED Final   Klebsiella aerogenes NOT DETECTED NOT DETECTED Final   Klebsiella oxytoca NOT DETECTED NOT DETECTED Final   Klebsiella pneumoniae NOT DETECTED NOT DETECTED Final   Proteus species NOT DETECTED NOT DETECTED Final   Salmonella species NOT DETECTED NOT DETECTED Final   Serratia marcescens NOT DETECTED NOT DETECTED Final   Haemophilus influenzae NOT DETECTED NOT DETECTED Final   Neisseria meningitidis NOT DETECTED NOT DETECTED Final   Pseudomonas aeruginosa NOT DETECTED NOT DETECTED Final   Stenotrophomonas maltophilia NOT DETECTED NOT DETECTED Final   Candida albicans NOT DETECTED NOT DETECTED Final   Candida auris NOT DETECTED NOT DETECTED Final   Candida glabrata NOT DETECTED NOT DETECTED Final   Candida krusei NOT DETECTED NOT DETECTED Final   Candida parapsilosis NOT DETECTED NOT DETECTED Final   Candida tropicalis NOT  DETECTED NOT DETECTED Final   Cryptococcus neoformans/gattii NOT DETECTED NOT DETECTED Final   Methicillin resistance mecA/C DETECTED (A) NOT DETECTED Final    Comment: CRITICAL RESULT CALLED TO, READ BACK BY AND VERIFIED WITH: [facility] PHARMD  04/24/2024 @ 0515 BY DD Performed at [facility], [address], [place], [place] [address]   Blood Culture (routine x 2)     Status: None (Preliminary result)   Collection Time: 04/23/24 10:46 AM   Specimen: BLOOD RIGHT HAND  Result Value Ref Range Status   Specimen Description BLOOD RIGHT HAND  Final   Special Requests  Final    BOTTLES DRAWN AEROBIC AND ANAEROBIC Blood Culture results may not be optimal due to an inadequate volume of blood received in culture bottles   Culture   Final    NO GROWTH 3 DAYS Performed at [facility], [address], [place], [place] [address]    Report Status PENDING  Incomplete  MRSA Next Gen by PCR, Nasal     Status: None   Collection Time: 04/23/24  1:52 PM   Specimen: Nasal Mucosa; Nasal Swab  Result Value Ref Range Status   MRSA by PCR Next Gen NOT DETECTED NOT DETECTED Final    Comment: (NOTE) The GeneXpert MRSA Assay (FDA approved for NASAL specimens only), is one component of a comprehensive MRSA colonization surveillance program. It is not intended to diagnose MRSA infection nor to guide or monitor treatment for MRSA infections. Test performance is not FDA approved in patients less than 5 years old. Performed at [facility], [address] [place], [place] [address]   Respiratory (~20 pathogens) panel by PCR     Status: None   Collection Time: 04/23/24  3:08 PM   Specimen: Nasopharyngeal Swab; Respiratory  Result Value Ref Range Status   Adenovirus NOT DETECTED NOT DETECTED Final   Coronavirus 229E NOT DETECTED NOT DETECTED Final    Comment: (NOTE) The Coronavirus on the Respiratory Panel, DOES NOT test for the novel  Coronavirus (2019 nCoV)    Coronavirus HKU1 NOT  DETECTED NOT DETECTED Final   Coronavirus NL63 NOT DETECTED NOT DETECTED Final   Coronavirus OC43 NOT DETECTED NOT DETECTED Final   Metapneumovirus NOT DETECTED NOT DETECTED Final   Rhinovirus / Enterovirus NOT DETECTED NOT DETECTED Final   Influenza A NOT DETECTED NOT DETECTED Final   Influenza B NOT DETECTED NOT DETECTED Final   Parainfluenza Virus 1 NOT DETECTED NOT DETECTED Final   Parainfluenza Virus 2 NOT DETECTED NOT DETECTED Final   Parainfluenza Virus 3 NOT DETECTED NOT DETECTED Final   Parainfluenza Virus 4 NOT DETECTED NOT DETECTED Final   Respiratory Syncytial Virus NOT DETECTED NOT DETECTED Final   Bordetella pertussis NOT DETECTED NOT DETECTED Final   Bordetella Parapertussis NOT DETECTED NOT DETECTED Final   Chlamydophila pneumoniae NOT DETECTED NOT DETECTED Final   Mycoplasma pneumoniae NOT DETECTED NOT DETECTED Final    Comment: Performed at [facility], [address], [place], [place] [address]    Labs: CBC: Recent Labs  Lab 04/23/24 1050 04/23/24 1109 04/23/24 1609 04/24/24 0241 04/25/24 1316  WBC 12.6*  --  9.9 11.5* 9.8  NEUTROABS 11.2*  --   --  9.1* 7.7  HGB 13.1 15.0  14.6 12.2* 12.4* 12.7*  HCT 41.2 44.0  43.0 36.5* 36.3* 38.4*  MCV 98.3  --  93.8 92.1 93.0  PLT 257  --  212 221 205   Basic Metabolic Panel: Recent Labs  Lab 04/23/24 1050 04/23/24 1109 04/23/24 1609 04/23/24 2111 04/24/24 0241 04/25/24 1316  NA 134* 131*  129* 136 138 136 144  K 5.4* 5.3*  5.3* 4.2 4.0 4.2 3.9  CL 91* 97* 98 100 100 108  CO2 16*  --  20* 22 27 23   GLUCOSE 820* >700* 538* 186* 140* 158*  BUN 53* 50* 58* 59* 60* 50*  CREATININE 4.36* 3.80* 4.24* 3.86* 3.83* 2.59*  CALCIUM 9.1  --  8.8* 9.0 8.7* 8.9   Liver Function Tests: Recent Labs  Lab 04/23/24 1050  AST 52*  ALT 15  ALKPHOS 62  BILITOT 2.9*  PROT 6.9  ALBUMIN 3.3*   CBG: Recent Labs  Lab 04/26/24 0002 04/26/24 0523 04/26/24 0548 04/26/24 0739 04/26/24 1151  GLUCAP 243* 67*  72 118* 197*    Discharge time spent: 34 minutes.   Signed: Elgie Butter, MD [facility] 04/26/2024

## 2024-04-26 NOTE — TOC Transition Note (Signed)
 Transition of Care [facility]) - Discharge Note   Patient Details  Name: Connor Dixon MRN: [id number] Date of Birth: August 13, 1934  [facility] CM/SW Contact:  Jeoffrey LITTIE [name] Phone Number: 04/26/2024, 4:07 PM   Clinical Narrative:    Patient will DC to: [facility] Anticipated DC date: 04/26/24 Family notified: Yes Transport by: [facility]   Per MD patient ready for DC to [facility]. RN to call report prior to discharge [phone] . RN, patient, patient's family, and facility notified of DC. Discharge Summary and FL2 sent to facility. DC packet on chart. Ambulance transport requested for patient.   CSW will sign off for now as social work intervention is no longer needed. Please consult us  again if new needs arise.     Final next level of care: Assisted Living Barriers to Discharge: Barriers Resolved   Patient Goals and CMS Choice            Discharge Placement              Patient chooses bed at: [facility] [place] Patient to be transferred to facility by: [facility] Name of family member notified: [name] Patient and family notified of of transfer: 04/26/24  Discharge Plan and Services Additional resources added to the After Visit Summary for   In-house Referral: Clinical Social Work                                   Social Drivers of Health (SDOH) Interventions SDOH Screenings   Food Insecurity: Patient Unable To Answer (04/24/2024)  Housing: Unknown (04/24/2024)  Transportation Needs: Patient Unable To Answer (04/24/2024)  Utilities: Patient Unable To Answer (04/24/2024)  Alcohol  Screen: Low Risk (04/06/2023)  Depression (PHQ2-9): Low Risk (09/06/2023)  Financial Resource Strain: Low Risk (04/06/2023)  Physical Activity: Inactive (04/06/2023)  Social Connections: Unknown (04/24/2024)  Stress: No Stress Concern Present (04/06/2023)  Tobacco Use: Low Risk (04/23/2024)  Health Literacy: Adequate Health Literacy (04/06/2023)      Readmission Risk Interventions     No data to display

## 2024-04-26 NOTE — TOC Progression Note (Addendum)
 Transition of Care [facility]) - Progression Note    Patient Details  Name: Connor Dixon MRN: [id number] Date of Birth: [birth year]  Transition of Care [facility]) CM/SW Contact  Xiomar Crompton LITTIE Moose, [place] Phone Number: 04/26/2024, 2:43 PM  Clinical Narrative:    CSW contacted [facility] to confirm if pt can return to ALF. CSW emailed updated Fl2 and clinicals to facility and is awaiting a response. CSW will continue to follow.  3:47 PM- CSW received confirmation from [facility] that pt can return today. CSW attempted to call both of pt sons and did not receive an answer. CSW left a voicemail for pt son, [name], to inform him that pt would return to [facility] via [place] today.   Expected Discharge Plan: Assisted Living Barriers to Discharge: Continued Medical Work up               Expected Discharge Plan and Services In-house Referral: Clinical Social Work     Living arrangements for the past 2 months: Assisted Living Facility                                       Social Drivers of Health (SDOH) Interventions SDOH Screenings   Food Insecurity: Patient Unable To Answer (04/24/2024)  Housing: Unknown (04/24/2024)  Transportation Needs: Patient Unable To Answer (04/24/2024)  Utilities: Patient Unable To Answer (04/24/2024)  Alcohol  Screen: Low Risk (04/06/2023)  Depression (PHQ2-9): Low Risk (09/06/2023)  Financial Resource Strain: Low Risk (04/06/2023)  Physical Activity: Inactive (04/06/2023)  Social Connections: Unknown (04/24/2024)  Stress: No Stress Concern Present (04/06/2023)  Tobacco Use: Low Risk (04/23/2024)  Health Literacy: Adequate Health Literacy (04/06/2023)    Readmission Risk Interventions     No data to display

## 2024-04-26 NOTE — Progress Notes (Signed)
 Connor Dixon to be D/C'd to assisted living per MD order.   IV catheter discontinued intact. Site without signs and symptoms of complications. Dressing and pressure applied.  An After Visit Summary was printed and given to [facility] for receiving facility.  Report called to [employer], merchandiser, retail at [employer].   Patient escorted via stretcher, and D/C to [facility] via non emergency ambulance.  [name] 04/26/2024 4:29 PM

## 2024-04-26 NOTE — Plan of Care (Signed)
°  Problem: Education: Goal: Knowledge of General Education information will improve Description: Including pain rating scale, medication(s)/side effects and non-pharmacologic comfort measures Outcome: Progressing   Problem: Clinical Measurements: Goal: Ability to maintain clinical measurements within normal limits will improve Outcome: Progressing   Problem: Activity: Goal: Risk for activity intolerance will decrease Outcome: Progressing   Problem: Nutrition: Goal: Adequate nutrition will be maintained Outcome: Progressing   Problem: Coping: Goal: Level of anxiety will decrease Outcome: Progressing   Problem: Pain Managment: Goal: General experience of comfort will improve and/or be controlled Outcome: Progressing   Problem: Safety: Goal: Ability to remain free from injury will improve Outcome: Progressing   Problem: Fluid Volume: Goal: Ability to maintain a balanced intake and output will improve Outcome: Progressing   Problem: Metabolic: Goal: Ability to maintain appropriate glucose levels will improve Outcome: Progressing   Problem: Nutritional: Goal: Maintenance of adequate nutrition will improve Outcome: Progressing

## 2024-04-26 NOTE — Progress Notes (Signed)
 Speech Language Pathology Treatment: Dysphagia  Patient Details Name: Connor Dixon MRN: [id number] DOB: 07-18-1934 Today's Date: 04/26/2024 Time: 1357-1410 SLP Time Calculation (min) (ACUTE ONLY): 13 min  Assessment / Plan / Recommendation Clinical Impression  Pt is a little sleepier today but also less impulsive. He did better generating enough suck to get liquid via straw. No overt signs of aspiration were noted with purees and thin liquids, but delayed coughing was noted after advanced trials of solids. This was observed after noted oral difficulties, including prolonged mastication, with incomplete oral clearance. A pureed bolus seemed to more fully clear the oral cavity compared to a liquid wash.  PLAN: Recommend continuing with Dys 1 (puree) diet and thin liquids. SLP to continue to follow.   HPI HPI: Pt is an 89 yo male presenting from SNF 12/9 with AMS. Admitted with severe DKA and sepsis. CT Chest concerning for possible mild LUL PNA. Pt has had two previous evaluations by SLP during other hospital stays this year, both times exhibiting what was presumed to be a cognitively-based oral dysphagia. Softer diets were recommended both admissions (purees in May/June 2025, mechanical soft in September 2025). PMH includes: dementia with reportedly fluctuating mentation since May 2025, HTN, HLD, CKD,  complete heart block s/p pacemaker, diastolic CHF, type 1 diabetes      SLP Plan  Continue with current plan of care        Swallow Evaluation Recommendations   Recommendations: PO diet PO Diet Recommendation: Dysphagia 1 (Pureed);Thin liquids (Level 0) Liquid Administration via: Cup;Straw Medication Administration: Crushed with puree Supervision: Staff to assist with self-feeding;Full supervision/cueing for swallowing strategies Postural changes: Position pt fully upright for meals Oral care recommendations: Oral care BID (2x/day)     Recommendations                            Dysphagia, unspecified (R13.10)     Continue with current plan of care     Leita SAILOR., M.A. CCC-SLP Acute Rehabilitation Services Office: [phone]  Secure chat preferred   04/26/2024, 2:10 PM

## 2024-04-26 NOTE — Care Management Important Message (Signed)
 Important Message  Patient Details  Name: Connor Dixon MRN: [id number] Date of Birth: 1934-11-07   Important Message Given:  Yes - Medicare IM     [name] 04/26/2024, 2:00 PM

## 2024-04-28 LAB — CULTURE, BLOOD (ROUTINE X 2): Culture: NO GROWTH

## 2024-04-29 ENCOUNTER — Telehealth: Payer: Self-pay | Admitting: *Deleted

## 2024-04-29 LAB — CULTURE, BLOOD (ROUTINE X 2): Special Requests: ADEQUATE

## 2024-04-29 NOTE — Transitions of Care (Post Inpatient/ED Visit) (Signed)
 04/29/2024  Name: Connor Dixon MRN: [id number] DOB: 1934/12/20  Today's TOC FU Call Status: Today's TOC FU Call Status:: Successful TOC FU Call Completed TOC FU Call Complete Date: 04/29/24  Patient's Name and Date of Birth confirmed. Name, DOB (with patient's son [name]- verified on [facility] DPR)  Transition Care Management Follow-up Telephone Call Date of Discharge: 04/26/24 Discharge Facility: [facility]) Type of Discharge: Inpatient Admission Primary Inpatient Discharge Diagnosis:: AMS; acute encephalopathy secondary to DKA How have you been since you were released from the hospital?: Same (He is back at the SNF now and they are providing all of his care.  He no longer goes to Dr. Joshua for primary care; his primary care is through the facility he lives at; her name is Adessa Cox)  04/29/24: verified per son on [facility] DPR: patient now resides in SNF- ALF and has new non-[facility] PCP; full TOC call - assessment deferred accordingly; EPIC updated to reflect son's report   Items Reviewed:    Medications Reviewed Today: Medications Reviewed Today     Reviewed by Zian Delair M, RN (Registered Nurse) on 04/29/24 at 1058  Med List Status: <None>   Medication Order Taking? Sig Documenting Provider Last Dose Status Informant  acetaminophen  (TYLENOL ) 325 MG tablet [id number] No Take 650 mg by mouth every 8 (eight) hours as needed (pain). [provider] Unknown Active Nursing Home Medication Administration Guide (MAG), Other, Care Giver  allopurinol  (ZYLOPRIM ) 100 MG tablet [id number] No TAKE 1 TABLET(100 MG) BY MOUTH DAILY [name], MD Unknown Active Nursing Home Medication Administration Guide (MAG), Other, Care Giver  apixaban  (ELIQUIS ) 2.5 MG TABS tablet [id number] No Take 1 tablet (2.5 mg total) by mouth 2 (two) times daily. [name], MD Unknown Active Nursing Home Medication Administration Guide (MAG), Other, Care Giver           Med Note (COFFELL, JON HERO    Wed Apr 24, 2024  8:18 AM) Last known dose 04/22/24, 2000 - unable to confirm if patient received 0800 dose PTA on 12/9.  calcitRIOL  (ROCALTROL ) 0.25 MCG capsule [id number] No TAKE 1 CAPSULE BY MOUTH DAILY RETURN IN ABOUT 6 MONTHS(AROUND 02/04/2023) [name], MD Unknown Active Nursing Home Medication Administration Guide (MAG), Other, Care Giver  Cholecalciferol (VITAMIN D3) 1000 units CAPS [id number] No Take 1,000 Units by mouth daily. [provider] Unknown Active Nursing Home Medication Administration Guide (MAG), Other, Care Giver  Continuous Glucose Sensor (DEXCOM G7 SENSOR) MISC [id number]  1 Act by Does not apply route daily.  Patient taking differently: Inject 1 Device into the skin See admin instructions. Place 1 new sensor into the skin every 10 days   [name], MD  Active Nursing Home Medication Administration Guide (MAG), Other, Care Giver  cyanocobalamin  1000 MCG tablet [id number] No Take 1 tablet (1,000 mcg total) by mouth daily. Trixie Nilda HERO, MD Unknown Active Nursing Home Medication Administration Guide (MAG), Other, Care Giver  feeding supplement, GLUCERNA SHAKE, (GLUCERNA SHAKE) LIQD [id number] No Take 237 mLs by mouth daily.  Patient taking differently: Take 237 mLs by mouth daily. Vanilla flavor   Trixie Nilda HERO, MD Unknown Active Nursing Home Medication Administration Guide (MAG), Other, Care Giver  ferrous sulfate  325 (65 FE) MG tablet [id number] No Take 1 tablet (325 mg total) by mouth daily with breakfast. Trixie Nilda HERO, MD Unknown Active Nursing Home Medication Administration Guide (MAG), Other, Care Giver  folic acid  (FOLVITE ) 1 MG tablet [id number] No Take  1 tablet (1 mg total) by mouth daily. Trixie Nilda HERO, MD Unknown Active Nursing Home Medication Administration Guide (MAG), Other, Care Giver  Glucose 15 GM/32ML GEL [id number] No Take 15 g by mouth as needed (BS < 60). [provider] Unknown Active Other, Nursing Home Medication  Administration Guide (MAG), Care Giver  hydrALAZINE  (APRESOLINE ) 10 MG tablet [id number] No Take 1 tablet (10 mg total) by mouth 3 (three) times daily. Fairy Frames, MD Unknown Active Nursing Home Medication Administration Guide (MAG), Other, Care Giver           Med Note (COFFELL, ANGELA M   Wed Apr 24, 2024  8:20 AM) Last known dose 04/22/24, 2000 - unable to confirm if patient received 0800 dose PTA on 12/9.  insulin  aspart (NOVOLOG ) 100 UNIT/ML FlexPen [id number] No Inject 3-9 Units into the skin See admin instructions. Inject 3 units into the skin three times daily with meals IN ADDITION to 0-6 units per sliding scale: 000 - 150 : 0 unit 151 - 190 : 1 unit 191 - 230 : 2 units 231 - 270 : 3 units 271 - 310 : 4 units 311 - 350 : 5 units 351 - 399 : 6 units >= 400 : notify MD [provider] Unknown Active Nursing Home Medication Administration Guide (MAG), Other, Care Giver           Med Note (COFFELL, ANGELA M   Wed Apr 24, 2024  8:32 AM) Last known dose 04/22/24, 1600 - unable to confirm if patient received 0700 dose PTA on 12/9.  insulin  glargine (LANTUS ) 100 UNIT/ML injection [id number]  Inject 0.07 mLs (7 Units total) into the skin 2 (two) times daily. Akula, Vijaya, MD  Active   levocetirizine (XYZAL ) 5 MG tablet [id number] No Take 1 tablet (5 mg total) by mouth every evening. [name], MD 04/22/2024 Active Nursing Home Medication Administration Guide (MAG), Other, Care Giver  loratadine  (CLARITIN ) 10 MG tablet [id number] No Take 10 mg by mouth daily as needed for allergies. [provider] Unknown Active Nursing Home Medication Administration Guide (MAG), Other, Care Giver  metoprolol  succinate (TOPROL -XL) 25 MG 24 hr tablet [id number] No Take 1 tablet (25 mg total) by mouth daily. Fairy Frames, MD Unknown Active Nursing Home Medication Administration Guide (MAG), Other, Care Giver  polyethylene glycol (MIRALAX  / GLYCOLAX ) 17 g packet [id number]  Take 17 g by mouth 2  (two) times daily. Cherlyn Labella, MD  Active   senna-docusate (SENOKOT-S) 8.6-50 MG tablet [id number]  Take 2 tablets by mouth 2 (two) times daily. Akula, Vijaya, MD  Active   tamsulosin  (FLOMAX ) 0.4 MG CAPS capsule [id number] No Take 1 capsule (0.4 mg total) by mouth daily. Trixie Nilda HERO, MD Unknown Active Nursing Home Medication Administration Guide (MAG), Other, Care Giver  torsemide  (DEMADEX ) 20 MG tablet [id number] No Take 1 tablet (20 mg total) by mouth daily. Give extra dose for weight gain of 3lb in 1 day or 5lb in 1 week  Patient taking differently: Take 20 mg by mouth See admin instructions. Give 1 tablet (20mg ) by mouth daily in the morning. Give an additional 1 tablet for weight gain of 3lb in 1 day or 5lb in 1 week   Fairy Frames, MD Unknown Active Nursing Home Medication Administration Guide (MAG), Other, Care Giver  Med List Note Geri Jon HERO, CPhT 04/24/24 9190): [facility] [place]: [phone]            Home Care and Equipment/Supplies:  Functional Questionnaire:    Follow up appointments reviewed:      Pls call/ message for questions,  Fama Muenchow Mckinney Fynn Adel, RN, BSN, CCRN Alumnus RN Care Manager  Transitions of Care  [facility] - [facility] [phone]: direct office

## 2024-05-04 ENCOUNTER — Other Ambulatory Visit: Payer: Self-pay

## 2024-05-04 ENCOUNTER — Inpatient Hospital Stay (HOSPITAL_COMMUNITY)
Admission: EM | Admit: 2024-05-04 | Discharge: 2024-05-08 | DRG: 640 | Disposition: A | Discharge status: Skilled Nursing Facility | Attending: Family Medicine | Admitting: Family Medicine

## 2024-05-04 ENCOUNTER — Encounter (HOSPITAL_COMMUNITY): Payer: Self-pay | Admitting: Internal Medicine

## 2024-05-04 DIAGNOSIS — E785 Hyperlipidemia, unspecified: Secondary | ICD-10-CM | POA: Diagnosis present

## 2024-05-04 DIAGNOSIS — E871 Hypo-osmolality and hyponatremia: Secondary | ICD-10-CM | POA: Diagnosis present

## 2024-05-04 DIAGNOSIS — E1022 Type 1 diabetes mellitus with diabetic chronic kidney disease: Secondary | ICD-10-CM | POA: Diagnosis present

## 2024-05-04 DIAGNOSIS — Z79899 Other long term (current) drug therapy: Secondary | ICD-10-CM

## 2024-05-04 DIAGNOSIS — E860 Dehydration: Secondary | ICD-10-CM | POA: Diagnosis present

## 2024-05-04 DIAGNOSIS — R29720 NIHSS score 20: Secondary | ICD-10-CM | POA: Diagnosis present

## 2024-05-04 DIAGNOSIS — Z950 Presence of cardiac pacemaker: Secondary | ICD-10-CM | POA: Diagnosis not present

## 2024-05-04 DIAGNOSIS — N1832 Chronic kidney disease, stage 3b: Secondary | ICD-10-CM | POA: Diagnosis present

## 2024-05-04 DIAGNOSIS — E870 Hyperosmolality and hypernatremia: Principal | ICD-10-CM | POA: Diagnosis present

## 2024-05-04 DIAGNOSIS — I480 Paroxysmal atrial fibrillation: Secondary | ICD-10-CM | POA: Diagnosis present

## 2024-05-04 DIAGNOSIS — R1310 Dysphagia, unspecified: Secondary | ICD-10-CM | POA: Diagnosis present

## 2024-05-04 DIAGNOSIS — N179 Acute kidney failure, unspecified: Secondary | ICD-10-CM | POA: Diagnosis present

## 2024-05-04 DIAGNOSIS — E1151 Type 2 diabetes mellitus with diabetic peripheral angiopathy without gangrene: Secondary | ICD-10-CM | POA: Diagnosis not present

## 2024-05-04 DIAGNOSIS — Z794 Long term (current) use of insulin: Secondary | ICD-10-CM

## 2024-05-04 DIAGNOSIS — F0280 Dementia in other diseases classified elsewhere without behavioral disturbance: Secondary | ICD-10-CM | POA: Diagnosis present

## 2024-05-04 DIAGNOSIS — R471 Dysarthria and anarthria: Secondary | ICD-10-CM | POA: Diagnosis present

## 2024-05-04 DIAGNOSIS — Z91013 Allergy to seafood: Secondary | ICD-10-CM

## 2024-05-04 DIAGNOSIS — N400 Enlarged prostate without lower urinary tract symptoms: Secondary | ICD-10-CM | POA: Diagnosis present

## 2024-05-04 DIAGNOSIS — Z8249 Family history of ischemic heart disease and other diseases of the circulatory system: Secondary | ICD-10-CM

## 2024-05-04 DIAGNOSIS — Z825 Family history of asthma and other chronic lower respiratory diseases: Secondary | ICD-10-CM

## 2024-05-04 DIAGNOSIS — G928 Other toxic encephalopathy: Secondary | ICD-10-CM | POA: Diagnosis present

## 2024-05-04 DIAGNOSIS — F0150 Vascular dementia without behavioral disturbance: Secondary | ICD-10-CM | POA: Diagnosis present

## 2024-05-04 DIAGNOSIS — I5042 Chronic combined systolic (congestive) and diastolic (congestive) heart failure: Secondary | ICD-10-CM | POA: Diagnosis present

## 2024-05-04 DIAGNOSIS — I442 Atrioventricular block, complete: Secondary | ICD-10-CM | POA: Diagnosis present

## 2024-05-04 DIAGNOSIS — Z96641 Presence of right artificial hip joint: Secondary | ICD-10-CM | POA: Diagnosis present

## 2024-05-04 DIAGNOSIS — I4891 Unspecified atrial fibrillation: Secondary | ICD-10-CM | POA: Diagnosis not present

## 2024-05-04 DIAGNOSIS — Z91018 Allergy to other foods: Secondary | ICD-10-CM

## 2024-05-04 DIAGNOSIS — I6389 Other cerebral infarction: Secondary | ICD-10-CM | POA: Diagnosis not present

## 2024-05-04 DIAGNOSIS — R29810 Facial weakness: Secondary | ICD-10-CM | POA: Diagnosis present

## 2024-05-04 DIAGNOSIS — Z823 Family history of stroke: Secondary | ICD-10-CM

## 2024-05-04 DIAGNOSIS — Z8349 Family history of other endocrine, nutritional and metabolic diseases: Secondary | ICD-10-CM

## 2024-05-04 DIAGNOSIS — I63542 Cerebral infarction due to unspecified occlusion or stenosis of left cerebellar artery: Principal | ICD-10-CM | POA: Diagnosis present

## 2024-05-04 DIAGNOSIS — N183 Chronic kidney disease, stage 3 unspecified: Secondary | ICD-10-CM | POA: Diagnosis not present

## 2024-05-04 DIAGNOSIS — R29712 NIHSS score 12: Secondary | ICD-10-CM | POA: Diagnosis not present

## 2024-05-04 DIAGNOSIS — Z833 Family history of diabetes mellitus: Secondary | ICD-10-CM

## 2024-05-04 DIAGNOSIS — I69391 Dysphagia following cerebral infarction: Secondary | ICD-10-CM | POA: Diagnosis not present

## 2024-05-04 DIAGNOSIS — Z888 Allergy status to other drugs, medicaments and biological substances status: Secondary | ICD-10-CM

## 2024-05-04 DIAGNOSIS — I130 Hypertensive heart and chronic kidney disease with heart failure and stage 1 through stage 4 chronic kidney disease, or unspecified chronic kidney disease: Secondary | ICD-10-CM | POA: Diagnosis present

## 2024-05-04 DIAGNOSIS — Z7901 Long term (current) use of anticoagulants: Secondary | ICD-10-CM | POA: Diagnosis not present

## 2024-05-04 DIAGNOSIS — M109 Gout, unspecified: Secondary | ICD-10-CM | POA: Diagnosis present

## 2024-05-04 DIAGNOSIS — I6381 Other cerebral infarction due to occlusion or stenosis of small artery: Secondary | ICD-10-CM | POA: Diagnosis not present

## 2024-05-04 DIAGNOSIS — I5022 Chronic systolic (congestive) heart failure: Secondary | ICD-10-CM | POA: Diagnosis not present

## 2024-05-04 LAB — CBC WITH DIFFERENTIAL/PLATELET
Abs Immature Granulocytes: 0.02 K/uL (ref 0.00–0.07)
Basophils Absolute: 0 K/uL (ref 0.0–0.1)
Basophils Relative: 0 %
Eosinophils Absolute: 0.2 K/uL (ref 0.0–0.5)
Eosinophils Relative: 2 %
HCT: 48.8 % (ref 39.0–52.0)
Hemoglobin: 15.3 g/dL (ref 13.0–17.0)
Immature Granulocytes: 0 %
Lymphocytes Relative: 26 %
Lymphs Abs: 2 K/uL (ref 0.7–4.0)
MCH: 31.4 pg (ref 26.0–34.0)
MCHC: 31.4 g/dL (ref 30.0–36.0)
MCV: 100 fL (ref 80.0–100.0)
Monocytes Absolute: 0.6 K/uL (ref 0.1–1.0)
Monocytes Relative: 8 %
Neutro Abs: 4.8 K/uL (ref 1.7–7.7)
Neutrophils Relative %: 64 %
Platelets: 218 K/uL (ref 150–400)
RBC: 4.88 MIL/uL (ref 4.22–5.81)
RDW: 18 % — ABNORMAL HIGH (ref 11.5–15.5)
WBC: 7.6 K/uL (ref 4.0–10.5)
nRBC: 0 % (ref 0.0–0.2)

## 2024-05-04 LAB — URINALYSIS, ROUTINE W REFLEX MICROSCOPIC
Bilirubin Urine: NEGATIVE
Glucose, UA: NEGATIVE mg/dL
Ketones, ur: NEGATIVE mg/dL
Leukocytes,Ua: NEGATIVE
Nitrite: NEGATIVE
Protein, ur: 100 mg/dL — AB
Specific Gravity, Urine: 1.015 (ref 1.005–1.030)
pH: 6 (ref 5.0–8.0)

## 2024-05-04 LAB — COMPREHENSIVE METABOLIC PANEL WITH GFR
ALT: 5 U/L (ref 0–44)
AST: 20 U/L (ref 15–41)
Albumin: 3.6 g/dL (ref 3.5–5.0)
Alkaline Phosphatase: 74 U/L (ref 38–126)
Anion gap: 14 (ref 5–15)
BUN: 53 mg/dL — ABNORMAL HIGH (ref 8–23)
CO2: 29 mmol/L (ref 22–32)
Calcium: 10.4 mg/dL — ABNORMAL HIGH (ref 8.9–10.3)
Chloride: 117 mmol/L — ABNORMAL HIGH (ref 98–111)
Creatinine, Ser: 2.96 mg/dL — ABNORMAL HIGH (ref 0.61–1.24)
GFR, Estimated: 20 mL/min — ABNORMAL LOW
Glucose, Bld: 173 mg/dL — ABNORMAL HIGH (ref 70–99)
Potassium: 4.1 mmol/L (ref 3.5–5.1)
Sodium: 161 mmol/L (ref 135–145)
Total Bilirubin: 1.1 mg/dL (ref 0.0–1.2)
Total Protein: 6.9 g/dL (ref 6.5–8.1)

## 2024-05-04 LAB — URINALYSIS, MICROSCOPIC (REFLEX)

## 2024-05-04 LAB — CBG MONITORING, ED
Glucose-Capillary: 147 mg/dL — ABNORMAL HIGH (ref 70–99)
Glucose-Capillary: 159 mg/dL — ABNORMAL HIGH (ref 70–99)

## 2024-05-04 MED ORDER — SODIUM CHLORIDE 0.9 % IV SOLN: Freq: Once | INTRAVENOUS | Status: AC

## 2024-05-04 MED ORDER — SODIUM CHLORIDE 0.45 % IV SOLN: INTRAVENOUS | Status: DC

## 2024-05-04 MED ORDER — INSULIN ASPART 100 UNIT/ML IJ SOLN
0.0000 [IU] | INTRAMUSCULAR | Status: DC
  Administered 2024-05-04 (×2): 2 [IU] via SUBCUTANEOUS
  Administered 2024-05-05 (×2): 4 [IU] via SUBCUTANEOUS
  Administered 2024-05-05: 2 [IU] via SUBCUTANEOUS
  Administered 2024-05-05: 4 [IU] via SUBCUTANEOUS
  Administered 2024-05-05: 2 [IU] via SUBCUTANEOUS
  Administered 2024-05-06: 4 [IU] via SUBCUTANEOUS
  Administered 2024-05-06: 8 [IU] via SUBCUTANEOUS
  Administered 2024-05-06 (×2): 4 [IU] via SUBCUTANEOUS
  Administered 2024-05-06: 2 [IU] via SUBCUTANEOUS
  Administered 2024-05-07: 8 [IU] via SUBCUTANEOUS
  Administered 2024-05-07: 12 [IU] via SUBCUTANEOUS
  Administered 2024-05-08 (×2): 8 [IU] via SUBCUTANEOUS
  Filled 2024-05-04: qty 4
  Filled 2024-05-04 (×2): qty 8
  Filled 2024-05-04 (×3): qty 2
  Filled 2024-05-04: qty 4
  Filled 2024-05-04: qty 12
  Filled 2024-05-04: qty 4
  Filled 2024-05-04: qty 2
  Filled 2024-05-04: qty 4
  Filled 2024-05-04 (×2): qty 8
  Filled 2024-05-04: qty 2
  Filled 2024-05-04: qty 4

## 2024-05-04 MED ORDER — ALLOPURINOL 100 MG PO TABS
100.0000 mg | ORAL_TABLET | Freq: Every day | ORAL | Status: DC
  Administered 2024-05-05 – 2024-05-08 (×4): 100 mg via ORAL
  Filled 2024-05-04 (×4): qty 1

## 2024-05-04 MED ORDER — SODIUM CHLORIDE 0.9 % IV SOLN: Freq: Once | INTRAVENOUS | Status: DC

## 2024-05-04 MED ORDER — INSULIN GLARGINE 100 UNIT/ML ~~LOC~~ SOLN
5.0000 [IU] | Freq: Every day | SUBCUTANEOUS | Status: DC
  Administered 2024-05-04 – 2024-05-08 (×5): 5 [IU] via SUBCUTANEOUS
  Filled 2024-05-04 (×5): qty 0.05

## 2024-05-04 MED ORDER — TAMSULOSIN HCL 0.4 MG PO CAPS
0.4000 mg | ORAL_CAPSULE | Freq: Every day | ORAL | Status: DC
  Administered 2024-05-05 – 2024-05-08 (×4): 0.4 mg via ORAL
  Filled 2024-05-04 (×4): qty 1

## 2024-05-04 MED ORDER — APIXABAN 2.5 MG PO TABS
2.5000 mg | ORAL_TABLET | Freq: Two times a day (BID) | ORAL | Status: DC
  Administered 2024-05-04 – 2024-05-08 (×8): 2.5 mg via ORAL
  Filled 2024-05-04 (×8): qty 1

## 2024-05-04 MED ORDER — SODIUM CHLORIDE 0.9 % IV BOLUS
500.0000 mL | Freq: Once | INTRAVENOUS | Status: AC
  Administered 2024-05-04: 500 mL via INTRAVENOUS

## 2024-05-04 MED ORDER — METOPROLOL SUCCINATE ER 25 MG PO TB24
25.0000 mg | ORAL_TABLET | Freq: Every day | ORAL | Status: DC
  Administered 2024-05-05: 25 mg via ORAL
  Filled 2024-05-04: qty 1

## 2024-05-04 NOTE — ED Notes (Signed)
Pt care taken, resting, no complaints. 

## 2024-05-04 NOTE — H&P (Signed)
 " History and Physical    Connor Dixon FMW:[id number] DOB: 09/27/1934 DOA: 05/04/2024  PCP: [name]   Chief Complaint: Abnormal labs  HPI: Connor Dixon is a 89 y.o. male with medical history significant of CHF, CKD stage III, type 2 diabetes, hypertension, hyperlipidemia who presented to the emergency department due to abnormal labs.  Patient was just discharged after being admitted with DKA.  Labs were obtained at different facility which showed sodium 161.  Patient was transferred to the ER for further assessment.  Labs were repeated.  CBC was unrevealing.  Creatinine was 2.9 at baseline, sodium 161.  Urinalysis was negative for infection.  Patient was started on IV fluids admitted for further workup.  He was reportedly at his neurologic baseline.  Patient was unable to provide much history regarding his presentation.   Review of Systems: Review of Systems  Constitutional: Negative.   HENT: Negative.    Eyes: Negative.   Respiratory: Negative.    Cardiovascular: Negative.   Gastrointestinal: Negative.   Genitourinary: Negative.   Musculoskeletal: Negative.   Skin: Negative.      As per HPI otherwise 10 point review of systems negative.   Allergies[1]  Past Medical History:  Diagnosis Date   Acute on chronic diastolic congestive heart failure (HCC)    Arthus phenomenon    Atrioventricular block, complete (HCC)    Cardiac pacemaker in situ 07/22/2010   Chronic kidney disease, stage 3 (HCC)    Presumably from longstanding DM & HTN. Scr has gradually increased over the past 2 years (1.6 in 12/2010, 2 in 03/2011, and up to 2.1 in 01/2013)   DIABETES MELLITUS, TYPE I, ADULT ONSET 08/06/2007   DM (diabetes mellitus) (HCC)    adult onset    Essential hypertension, benign 08/06/2007   Very good BP control   History of second degree heart block    HLD (hyperlipidemia)    HYPERLIPIDEMIA 08/06/2007   Hypopotassemia    Morbid obesity (HCC)    Obesity    Other and  unspecified angina pectoris    typical   Type I (juvenile type) diabetes mellitus without mention of complication, not stated as uncontrolled    (04/25/13 OV with Dr. Rayburn) = Poorly controlled & is working  w/Dr. Karlos to improve this. Most recent Hgb A1c was 8.2% but [name] reports it was down to 7.6 last month.    Past Surgical History:  Procedure Laterality Date   CATARACT EXTRACTION  8/08   [place]   PPM GENERATOR CHANGEOUT N/A 02/13/2019   Procedure: PPM GENERATOR CHANGEOUT;  Surgeon: Waddell Danelle ORN, MD;  Location: [facility] INVASIVE CV LAB;  Service: Cardiovascular;  Laterality: N/A;   PTVDP  12/11   PPM - [employer]   RIGHT HEART CATH N/A 12/29/2023   Procedure: RIGHT HEART CATH;  Surgeon: Cherrie Toribio SAUNDERS, MD;  Location: [facility] INVASIVE CV LAB;  Service: Cardiovascular;  Laterality: N/A;   TOTAL HIP ARTHROPLASTY  04/05/06   right     reports that he has never smoked. He has never used smokeless tobacco. He reports that he does not drink alcohol  and does not use drugs.  Family History  Problem Relation Age of Onset   Asthma Father    Coronary artery disease Father    Diabetes Father    Hyperlipidemia Father    Stroke Father 57   Coronary artery disease Mother    Asthma Mother    Heart attack Mother 27   Colon  cancer Neg Hx        no definate prostate CA     Prior to Admission medications  Medication Sig Start Date End Date Taking? Authorizing Provider  acetaminophen  (TYLENOL ) 325 MG tablet Take 650 mg by mouth every 8 (eight) hours as needed (pain).    [provider]  allopurinol  (ZYLOPRIM ) 100 MG tablet TAKE 1 TABLET(100 MG) BY MOUTH DAILY 09/25/23   [name], MD  apixaban  (ELIQUIS ) 2.5 MG TABS tablet Take 1 tablet (2.5 mg total) by mouth 2 (two) times daily. 08/14/23   [name], MD  calcitRIOL  (ROCALTROL ) 0.25 MCG capsule TAKE 1 CAPSULE BY MOUTH DAILY RETURN IN ABOUT 6 MONTHS(AROUND 02/04/2023) 08/14/23   [name], MD  Cholecalciferol  (VITAMIN D3) 1000 units CAPS Take 1,000 Units by mouth daily.    [provider]  Continuous Glucose Sensor (DEXCOM G7 SENSOR) MISC 1 Act by Does not apply route daily. Patient taking differently: Inject 1 Device into the skin See admin instructions. Place 1 new sensor into the skin every 10 days 01/06/23   [name], MD  cyanocobalamin  1000 MCG tablet Take 1 tablet (1,000 mcg total) by mouth daily. 11/08/23   Gherghe, Costin M, MD  feeding supplement, GLUCERNA SHAKE, (GLUCERNA SHAKE) LIQD Take 237 mLs by mouth daily. Patient taking differently: Take 237 mLs by mouth daily. Vanilla flavor 11/07/23   Trixie Nilda HERO, MD  ferrous sulfate  325 (65 FE) MG tablet Take 1 tablet (325 mg total) by mouth daily with breakfast. 11/08/23   Trixie Nilda HERO, MD  folic acid  (FOLVITE ) 1 MG tablet Take 1 tablet (1 mg total) by mouth daily. 11/08/23   Gherghe, Costin M, MD  Glucose 15 GM/32ML GEL Take 15 g by mouth as needed (BS < 60).    [provider]  hydrALAZINE  (APRESOLINE ) 10 MG tablet Take 1 tablet (10 mg total) by mouth 3 (three) times daily. 12/29/23   Fairy Frames, MD  insulin  aspart (NOVOLOG ) 100 UNIT/ML FlexPen Inject 3-9 Units into the skin See admin instructions. Inject 3 units into the skin three times daily with meals IN ADDITION to 0-6 units per sliding scale: 000 - 150 : 0 unit 151 - 190 : 1 unit 191 - 230 : 2 units 231 - 270 : 3 units 271 - 310 : 4 units 311 - 350 : 5 units 351 - 399 : 6 units >= 400 : notify MD    [provider]  insulin  glargine (LANTUS ) 100 UNIT/ML injection Inject 0.07 mLs (7 Units total) into the skin 2 (two) times daily. 04/26/24   Akula, Vijaya, MD  levocetirizine (XYZAL ) 5 MG tablet Take 1 tablet (5 mg total) by mouth every evening. 09/06/23   [name], MD  loratadine  (CLARITIN ) 10 MG tablet Take 10 mg by mouth daily as needed for allergies.    [provider]  metoprolol  succinate (TOPROL -XL) 25 MG 24 hr tablet Take 1  tablet (25 mg total) by mouth daily. 12/30/23   Joseph, Preetha, MD  polyethylene glycol (MIRALAX  / GLYCOLAX ) 17 g packet Take 17 g by mouth 2 (two) times daily. 04/26/24   Cherlyn Labella, MD  senna-docusate (SENOKOT-S) 8.6-50 MG tablet Take 2 tablets by mouth 2 (two) times daily. 04/26/24   Akula, Vijaya, MD  tamsulosin  (FLOMAX ) 0.4 MG CAPS capsule Take 1 capsule (0.4 mg total) by mouth daily. 11/08/23   Gherghe, Costin M, MD  torsemide  (DEMADEX ) 20 MG tablet Take 1 tablet (  20 mg total) by mouth daily. Give extra dose for weight gain of 3lb in 1 day or 5lb in 1 week Patient taking differently: Take 20 mg by mouth See admin instructions. Give 1 tablet (20mg ) by mouth daily in the morning. Give an additional 1 tablet for weight gain of 3lb in 1 day or 5lb in 1 week 12/30/23   Fairy Frames, MD    Physical Exam: Vitals:   05/04/24 1945 05/04/24 2015 05/04/24 2045 05/04/24 2117  BP: 109/66 116/66 127/73   Pulse: 79 79 79   Resp: 18 15 17    Temp:    98 F (36.7 C)  TempSrc:    Oral  SpO2: 100% 100% 100%    Physical Exam Constitutional:      Appearance: He is normal weight.  HENT:     Head: Normocephalic.     Nose: Nose normal.     Mouth/Throat:     Mouth: Mucous membranes are dry.  Eyes:     Extraocular Movements: Extraocular movements intact.     Pupils: Pupils are equal, round, and reactive to light.  Cardiovascular:     Rate and Rhythm: Normal rate and regular rhythm.  Pulmonary:     Effort: Pulmonary effort is normal.     Breath sounds: Normal breath sounds.  Abdominal:     General: Abdomen is flat.  Musculoskeletal:        General: Normal range of motion.  Skin:    General: Skin is warm.     Capillary Refill: Capillary refill takes less than 2 seconds.  Neurological:     Mental Status: He is alert. Mental status is at baseline. He is disoriented.  Psychiatric:        Mood and Affect: Mood normal.       Labs on Admission: I have personally reviewed the patients's labs  and imaging studies.  Assessment/Plan Principal Problem:   Dehydration   # Hypernatremia - Patient appears dry - Recent admission for DKA  Plan: Place on half-normal saline 100 cc/h Check BMP every 6 hours  # History of gout-continue allopurinol   # Paroxysmal A-fib-continue Eliquis   # Type 2 diabetes-placed on Lantus  and sliding scale every 4 hours  # Hypertension-continue metoprolol   # BPH-continue Flomax     Admission status: Inpatient Telemetry  Certification: The appropriate patient status for this patient is INPATIENT. Inpatient status is judged to be reasonable and necessary in order to provide the required intensity of service to ensure the patient's safety. The patient's presenting symptoms, physical exam findings, and initial radiographic and laboratory data in the context of their chronic comorbidities is felt to place them at high risk for further clinical deterioration. Furthermore, it is not anticipated that the patient will be medically stable for discharge from the hospital within 2 midnights of admission.   * I certify that at the point of admission it is my clinical judgment that the patient will require inpatient hospital care spanning beyond 2 midnights from the point of admission due to high intensity of service, high risk for further deterioration and high frequency of surveillance required.DEWAINE Lamar Dess MD [facility] If 7PM-7AM, please contact night-coverage [email or web address]  05/04/2024, 9:25 PM         [1]  Allergies Allergen Reactions   Shellfish Allergy Hives and Swelling   Citrus Hives   Pecan Extract Hives   Apple Other (See Comments)    Sore throat   Cabbage Other (See Comments)  Unknown reaction   Lemon Oil Hives   Walnut Other (See Comments)    Unknown reaction   Farxiga  [Dapagliflozin ] Other (See Comments)    Low blood sugar   "

## 2024-05-04 NOTE — ED Notes (Signed)
 pt son Connor Dixon) would like update on pt call back# [phone]

## 2024-05-04 NOTE — ED Triage Notes (Signed)
 Patient BIB [facility] from [facility], for abnormal labs. EMS reported the facility not sure the labs that were abnormal. Facility said patient blood sugar was elevated, it was reported 198, but not sure the amount of insulin  given to patient. Blood sugar taken buy EMS was 200. Facility said the hospice nurse told them to send patient to the hospital. Patient is A & O x3. Vital signs: BP 130/70, HR 66, RR 16.

## 2024-05-04 NOTE — ED Provider Notes (Signed)
 " [place] [facility] Provider Note   CSN: [id number] Arrival date & time: 05/04/24  1616     Patient presents with: No chief complaint on file.   Connor Dixon is a 89 y.o. male.   89 year old male with prior medical history as detailed below presents for evaluation.  Patient arrives from [facility].  EMS was called secondary to abnormal labs.  Patient is unable to tell me what labs were abnormal.  I contacted [facility], and staff there were unable to provide any details as to which labs were abnormal.  I reached out to the patient's listed next of kin - his son - and left a voice message requesting more details on the abnormal labs.  The patient appears to be in no acute distress.  He does not have any specific complaint.  It is unclear what the abnormality was seen on any recently obtained labs were that required his emergent evaluation tonight.  The history is provided by the patient and medical records.       Prior to Admission medications  Medication Sig Start Date End Date Taking? Authorizing Provider  acetaminophen  (TYLENOL ) 325 MG tablet Take 650 mg by mouth every 8 (eight) hours as needed (pain).    [provider]  allopurinol  (ZYLOPRIM ) 100 MG tablet TAKE 1 TABLET(100 MG) BY MOUTH DAILY 09/25/23   [name], MD  apixaban  (ELIQUIS ) 2.5 MG TABS tablet Take 1 tablet (2.5 mg total) by mouth 2 (two) times daily. 08/14/23   [name], MD  calcitRIOL  (ROCALTROL ) 0.25 MCG capsule TAKE 1 CAPSULE BY MOUTH DAILY RETURN IN ABOUT 6 MONTHS(AROUND 02/04/2023) 08/14/23   [name], MD  Cholecalciferol (VITAMIN D3) 1000 units CAPS Take 1,000 Units by mouth daily.    [provider]  Continuous Glucose Sensor (DEXCOM G7 SENSOR) MISC 1 Act by Does not apply route daily. Patient taking differently: Inject 1 Device into the skin See admin instructions. Place 1 new sensor into the skin every 10 days 01/06/23   [name], MD  cyanocobalamin  1000 MCG tablet Take 1 tablet (1,000 mcg total) by mouth daily. 11/08/23   Gherghe, Costin M, MD  feeding supplement, GLUCERNA SHAKE, (GLUCERNA SHAKE) LIQD Take 237 mLs by mouth daily. Patient taking differently: Take 237 mLs by mouth daily. Vanilla flavor 11/07/23   Trixie Nilda HERO, MD  ferrous sulfate  325 (65 FE) MG tablet Take 1 tablet (325 mg total) by mouth daily with breakfast. 11/08/23   Trixie Nilda HERO, MD  folic acid  (FOLVITE ) 1 MG tablet Take 1 tablet (1 mg total) by mouth daily. 11/08/23   Gherghe, Costin M, MD  Glucose 15 GM/32ML GEL Take 15 g by mouth as needed (BS < 60).    [provider]  hydrALAZINE  (APRESOLINE ) 10 MG tablet Take 1 tablet (10 mg total) by mouth 3 (three) times daily. 12/29/23   Fairy Frames, MD  insulin  aspart (NOVOLOG ) 100 UNIT/ML FlexPen Inject 3-9 Units into the skin See admin instructions. Inject 3 units into the skin three times daily with meals IN ADDITION to 0-6 units per sliding scale: 000 - 150 : 0 unit 151 - 190 : 1 unit 191 - 230 : 2 units 231 - 270 : 3 units 271 - 310 : 4 units 311 - 350 : 5 units 351 - 399 : 6 units >= 400 : notify MD    [provider]  insulin  glargine (LANTUS ) 100  UNIT/ML injection Inject 0.07 mLs (7 Units total) into the skin 2 (two) times daily. 04/26/24   Akula, Vijaya, MD  levocetirizine (XYZAL ) 5 MG tablet Take 1 tablet (5 mg total) by mouth every evening. 09/06/23   [name], MD  loratadine  (CLARITIN ) 10 MG tablet Take 10 mg by mouth daily as needed for allergies.    [provider]  metoprolol  succinate (TOPROL -XL) 25 MG 24 hr tablet Take 1 tablet (25 mg total) by mouth daily. 12/30/23   Joseph, Preetha, MD  polyethylene glycol (MIRALAX  / GLYCOLAX ) 17 g packet Take 17 g by mouth 2 (two) times daily. 04/26/24   Cherlyn Labella, MD  senna-docusate (SENOKOT-S) 8.6-50 MG tablet Take 2 tablets by mouth 2 (two) times daily. 04/26/24   Akula, Vijaya, MD  tamsulosin   (FLOMAX ) 0.4 MG CAPS capsule Take 1 capsule (0.4 mg total) by mouth daily. 11/08/23   Gherghe, Costin M, MD  torsemide  (DEMADEX ) 20 MG tablet Take 1 tablet (20 mg total) by mouth daily. Give extra dose for weight gain of 3lb in 1 day or 5lb in 1 week Patient taking differently: Take 20 mg by mouth See admin instructions. Give 1 tablet (20mg ) by mouth daily in the morning. Give an additional 1 tablet for weight gain of 3lb in 1 day or 5lb in 1 week 12/30/23   Fairy Frames, MD    Allergies: Shellfish allergy, Citrus, Pecan extract, Apple, Cabbage, Lemon oil, Walnut, and Farxiga  [dapagliflozin ]    Review of Systems  All other systems reviewed and are negative.   Updated Vital Signs BP 115/67   Pulse 80   Temp (!) 97.5 F (36.4 C) (Oral)   Resp 16   SpO2 100%   Physical Exam Vitals and nursing note reviewed.  Constitutional:      General: He is not in acute distress.    Appearance: He is well-developed.  HENT:     Head: Normocephalic and atraumatic.  Eyes:     Conjunctiva/sclera: Conjunctivae normal.  Cardiovascular:     Rate and Rhythm: Normal rate and regular rhythm.     Heart sounds: No murmur heard. Pulmonary:     Effort: Pulmonary effort is normal. No respiratory distress.     Breath sounds: Normal breath sounds.  Abdominal:     Palpations: Abdomen is soft.     Tenderness: There is no abdominal tenderness.  Musculoskeletal:        General: No swelling.     Cervical back: Neck supple.  Skin:    General: Skin is warm and dry.     Capillary Refill: Capillary refill takes less than 2 seconds.  Neurological:     Mental Status: He is alert.  Psychiatric:        Mood and Affect: Mood normal.     (all labs ordered are listed, but only abnormal results are displayed) Labs Reviewed  CBC WITH DIFFERENTIAL/PLATELET  COMPREHENSIVE METABOLIC PANEL WITH GFR  URINALYSIS, ROUTINE W REFLEX MICROSCOPIC    EKG: EKG Interpretation Date/Time:  Saturday May 04 2024 17:18:33  EST Ventricular Rate:  79 PR Interval:  82 QRS Duration:  169 QT Interval:  495 QTC Calculation: 568 R Axis:   -80  Text Interpretation: Atrial-sensed ventricular-paced rhythm No further analysis attempted due to paced rhythm Artifact in lead(s) I II III aVR aVL Confirmed by Laurice Coy [phone]) on 05/04/2024 5:21:07 PM  Radiology: No results found.   Procedures   Medications Ordered in the ED - No data to display  Medical Decision Making Patient sent to the ED for evaluation of abnormal labs.  On workup, patient sodium is notably elevated to 160.  Patient would benefit from admission for further workup and treatment.  Hospitalist service is aware of case.  Family at bedside understands plan of care.  Amount and/or Complexity of Data Reviewed Labs: ordered.  Risk Prescription drug management. Decision regarding hospitalization.        Final diagnoses:  Hypernatremia    ED Discharge Orders     None          Laurice Maude BROCKS, MD 05/04/24 2116  "

## 2024-05-04 NOTE — ED Notes (Signed)
 Unable to give ordered medication due to waiting for pharmacy to verify medications.

## 2024-05-05 DIAGNOSIS — E860 Dehydration: Secondary | ICD-10-CM | POA: Diagnosis not present

## 2024-05-05 LAB — RENAL FUNCTION PANEL
Albumin: 3 g/dL — ABNORMAL LOW (ref 3.5–5.0)
Albumin: 2.8 g/dL — ABNORMAL LOW (ref 3.5–5.0)
Anion gap: 11 (ref 5–15)
Anion gap: 8 (ref 5–15)
BUN: 44 mg/dL — ABNORMAL HIGH (ref 8–23)
BUN: 39 mg/dL — ABNORMAL HIGH (ref 8–23)
CO2: 27 mmol/L (ref 22–32)
CO2: 29 mmol/L (ref 22–32)
Calcium: 9.1 mg/dL (ref 8.9–10.3)
Calcium: 8.7 mg/dL — ABNORMAL LOW (ref 8.9–10.3)
Chloride: 114 mmol/L — ABNORMAL HIGH (ref 98–111)
Chloride: 114 mmol/L — ABNORMAL HIGH (ref 98–111)
Creatinine, Ser: 2.28 mg/dL — ABNORMAL HIGH (ref 0.61–1.24)
Creatinine, Ser: 2.2 mg/dL — ABNORMAL HIGH (ref 0.61–1.24)
GFR, Estimated: 27 mL/min — ABNORMAL LOW
GFR, Estimated: 28 mL/min — ABNORMAL LOW
Glucose, Bld: 219 mg/dL — ABNORMAL HIGH (ref 70–99)
Glucose, Bld: 217 mg/dL — ABNORMAL HIGH (ref 70–99)
Phosphorus: 3.1 mg/dL (ref 2.5–4.6)
Phosphorus: 2.6 mg/dL (ref 2.5–4.6)
Potassium: 3.7 mmol/L (ref 3.5–5.1)
Potassium: 3.5 mmol/L (ref 3.5–5.1)
Sodium: 151 mmol/L — ABNORMAL HIGH (ref 135–145)
Sodium: 151 mmol/L — ABNORMAL HIGH (ref 135–145)

## 2024-05-05 LAB — BASIC METABOLIC PANEL WITH GFR
Anion gap: 8 (ref 5–15)
Anion gap: 13 (ref 5–15)
BUN: 45 mg/dL — ABNORMAL HIGH (ref 8–23)
BUN: 51 mg/dL — ABNORMAL HIGH (ref 8–23)
CO2: 25 mmol/L (ref 22–32)
CO2: 28 mmol/L (ref 22–32)
Calcium: 7.4 mg/dL — ABNORMAL LOW (ref 8.9–10.3)
Calcium: 9.8 mg/dL (ref 8.9–10.3)
Chloride: 127 mmol/L — ABNORMAL HIGH (ref 98–111)
Chloride: 119 mmol/L — ABNORMAL HIGH (ref 98–111)
Creatinine, Ser: 2.22 mg/dL — ABNORMAL HIGH (ref 0.61–1.24)
Creatinine, Ser: 2.68 mg/dL — ABNORMAL HIGH (ref 0.61–1.24)
GFR, Estimated: 28 mL/min — ABNORMAL LOW
GFR, Estimated: 22 mL/min — ABNORMAL LOW
Glucose, Bld: 130 mg/dL — ABNORMAL HIGH (ref 70–99)
Glucose, Bld: 174 mg/dL — ABNORMAL HIGH (ref 70–99)
Potassium: 3.1 mmol/L — ABNORMAL LOW (ref 3.5–5.1)
Potassium: 3.9 mmol/L (ref 3.5–5.1)
Sodium: 160 mmol/L — ABNORMAL HIGH (ref 135–145)
Sodium: 160 mmol/L — ABNORMAL HIGH (ref 135–145)

## 2024-05-05 LAB — GLUCOSE, CAPILLARY
Glucose-Capillary: 135 mg/dL — ABNORMAL HIGH (ref 70–99)
Glucose-Capillary: 129 mg/dL — ABNORMAL HIGH (ref 70–99)
Glucose-Capillary: 179 mg/dL — ABNORMAL HIGH (ref 70–99)
Glucose-Capillary: 130 mg/dL — ABNORMAL HIGH (ref 70–99)
Glucose-Capillary: 198 mg/dL — ABNORMAL HIGH (ref 70–99)
Glucose-Capillary: 175 mg/dL — ABNORMAL HIGH (ref 70–99)

## 2024-05-05 LAB — MRSA NEXT GEN BY PCR, NASAL: MRSA by PCR Next Gen: NOT DETECTED

## 2024-05-05 MED ORDER — CHLORHEXIDINE GLUCONATE CLOTH 2 % EX PADS
6.0000 | MEDICATED_PAD | Freq: Every day | CUTANEOUS | Status: DC
  Administered 2024-05-05 – 2024-05-08 (×4): 6 via TOPICAL

## 2024-05-05 MED ORDER — DEXTROSE 5 % IV SOLN: INTRAVENOUS | Status: DC

## 2024-05-05 MED ORDER — POTASSIUM CHLORIDE 10 MEQ/100ML IV SOLN
10.0000 meq | INTRAVENOUS | Status: AC
  Administered 2024-05-05 (×3): 10 meq via INTRAVENOUS
  Filled 2024-05-05 (×3): qty 100

## 2024-05-05 MED ORDER — POTASSIUM CHLORIDE 20 MEQ PO PACK
60.0000 meq | PACK | Freq: Once | ORAL | Status: DC
  Filled 2024-05-05: qty 3

## 2024-05-05 NOTE — Progress Notes (Signed)
 [name]:[id number]  DOB: [birth year]  DOA: 05/04/2024  PCP: Connor Dixon, Connor Dixon  05/05/2024,7:42 AM  LOS: 1 day    Code Status: Full code     from: [facility]   89 male HTN HLD  complete heart block/PA fib Chad2> 4  with PPM and HFrEF EF 25-30% DM TY 2--- previously has had Proteus UTI with hematuria Known Encephalopathy with some Parkinsonism/Vascular dementia Recent hospitalization 12/9-12/12 for urinary retention DKA in the setting Dixon moderate constipation-thought was also for sepsis Treated for DKA started on dysphagia 1 diet and needed to be fed-transitioned to doxycycline  at discharge completing 5 days antibiotics and discharged back to facility  Sent over from [facility] because Dixon abnormal labs-MAR seems to indicate that he is on Demadex    Assessment  & Plan :    Toxic metabolic encephalopathy 2/2 AKI/severe hyponatremia Change half-normal to D5 125 cc/H-hopeful for recovery every 4 sodium checks Lethargic-not completely safe to take medication unless he is more awake and nursing is aware Dixon the same  Transient urinary retention Retaining about 400 cc earlier but was able to pass urine when they were about to place Foley Continue Flomax  0.4, every 8hr  bladder scan  Complete heart block paroxysmal A-fib PPM in place HFrEF 25% Continue metoprolol  XL 25, Eliquis  2.5 twice daily ---holding --->Demadex  20 hydralazine  10 for now  Diabetes mellitus with with recent admission for DKA (Home meds 7 units twice daily long-acting insulin ) Will have to place on every 4 coverage given likely will be pretty much n.p.o. Keep Lantus  5, place on every 4 coverage  Vascular dementia-CSF autoimmune panel was not obtainable Large workup performed as per progress note/discharge summary 11/08/2023 Will need outpatient follow-up with Dr. Skeet who is treating him for combined vascular dementia etc. Resume cyanocobalamin  1000 folic acid  1 mg when more awake   Data Reviewed  today: Sodium 160 potassium 3.9 chloride 119 BUN/creatinine 21/2.6   DVT prophylaxis: SCD   Dispo/Global plan: Discussed with son on the phoneStephen Dixon who tells me that patient has intermittent confusion sometimes is conversant but does lose his memory at times-does mention that he is not on hospice and that this has been discussed previously with them    Subjective:   A little sleepy and intermittently awake-cannot orient place person time  Objective + exam Vitals:   05/04/24 2330 05/05/24 0100 05/05/24 0219 05/05/24 0432  BP: 128/74 120/64  (!) 142/60  Pulse: 81 81  78  Resp: 14 16  18   Temp: 98.1 F (36.7 C) 98.1 F (36.7 C)  (!) 97.5 F (36.4 C)  TempSrc:      SpO2: 99% 98%  99%  Weight:   69.1 kg   Height:   5' 8 (1.727 m)    Filed Weights   05/05/24 0219  Weight: 69.1 kg     Examination: Very dry mucosa poor dentition Chest is clear Abdomen soft S1-S2 no murmur pacer in place-telemetry reviewed with bundle branch block which apparently is new from earlier Neurologically intact    Scheduled Meds:  allopurinol   100 mg Oral Daily   apixaban   2.5 mg Oral BID   Chlorhexidine  Gluconate Cloth  6 each Topical Daily   insulin  aspart  0-24 Units Subcutaneous Q4H   insulin  glargine  5 Units Subcutaneous Daily   metoprolol  succinate  25 mg Oral Daily   potassium chloride   60 mEq Oral Once   tamsulosin   0.4 mg Oral Daily  Continuous Infusions:  dextrose  125 mL/hr at 05/05/24 9173     Time 40  Connor Jaylend Reiland, MD  [facility]

## 2024-05-05 NOTE — Progress Notes (Signed)
 NEW ADMISSION NOTE    Arrival Method: ED stretcher Mental Orientation: AAOx1 Telemetry: [phone] Assessment: Completed Skin: See flowsheet IV: RFA RAC Pain: 0/10 Tubes: n/a Safety Measures: Safety Fall Prevention Plan has been given, discussed and signed Admission: Completed [facility] Orientation: Patient has been orientated to the room, unit and staff.  Family: none at bedside   Orders have been reviewed and implemented. Will continue to monitor the patient. Call light has been placed within reach and bed alarm has been activated.

## 2024-05-06 DIAGNOSIS — E860 Dehydration: Secondary | ICD-10-CM | POA: Diagnosis not present

## 2024-05-06 LAB — GLUCOSE, CAPILLARY
Glucose-Capillary: 140 mg/dL — ABNORMAL HIGH (ref 70–99)
Glucose-Capillary: 176 mg/dL — ABNORMAL HIGH (ref 70–99)
Glucose-Capillary: 185 mg/dL — ABNORMAL HIGH (ref 70–99)
Glucose-Capillary: 192 mg/dL — ABNORMAL HIGH (ref 70–99)
Glucose-Capillary: 217 mg/dL — ABNORMAL HIGH (ref 70–99)
Glucose-Capillary: 76 mg/dL (ref 70–99)
Glucose-Capillary: 78 mg/dL (ref 70–99)

## 2024-05-06 LAB — RENAL FUNCTION PANEL
Albumin: 3.2 g/dL — ABNORMAL LOW (ref 3.5–5.0)
Anion gap: 9 (ref 5–15)
BUN: 36 mg/dL — ABNORMAL HIGH (ref 8–23)
CO2: 29 mmol/L (ref 22–32)
Calcium: 8.2 mg/dL — ABNORMAL LOW (ref 8.9–10.3)
Chloride: 110 mmol/L (ref 98–111)
Creatinine, Ser: 2.01 mg/dL — ABNORMAL HIGH (ref 0.61–1.24)
GFR, Estimated: 32 mL/min — ABNORMAL LOW
Glucose, Bld: 200 mg/dL — ABNORMAL HIGH (ref 70–99)
Phosphorus: 2.6 mg/dL (ref 2.5–4.6)
Potassium: 3.6 mmol/L (ref 3.5–5.1)
Sodium: 147 mmol/L — ABNORMAL HIGH (ref 135–145)

## 2024-05-06 MED ORDER — LACTATED RINGERS IV SOLN: INTRAVENOUS | Status: DC

## 2024-05-06 NOTE — Plan of Care (Signed)

## 2024-05-06 NOTE — Progress Notes (Signed)
 Connor Dixon FMW:[id number]  DOB: 1934-11-29  DOA: 05/04/2024  PCP: [name], [facility]  05/06/2024,1:56 PM  LOS: 2 days    Code Status: Full code     from: [facility]   89 male HTN HLD  complete heart block/PA fib Chad2> 4  with PPM and HFrEF EF 25-30% DM TY 2--- previously has had Proteus UTI with hematuria Known Encephalopathy with some Parkinsonism/Vascular dementia Recent hospitalization 12/9-12/12 for urinary retention DKA in the setting of moderate constipation-thought was also for sepsis Treated for DKA started on dysphagia 1 diet and needed to be fed-transitioned to doxycycline  at discharge completing 5 days antibiotics and discharged back to facility  Sent over from [facility] because of abnormal labs-MAR seems to indicate that he is on Demadex    Assessment  & Plan :    Toxic metabolic encephalopathy 2/2 AKI/severe hyponatremia Resolving well----now LR 75 cc/h Resumed majority of necessary meds as below  Facial droop L sided--Ct head neg for CVA D/w son who tells me usually is not there--first obs during hospitalization may 2025---extensive w/u including LP etc etc Recurred after recent DKA admit Will ask neuro a non-emergent opinion--they mention get MRI  Transient urinary retention Retaining about 400 cc earlier but was able to pass urine when they were about to place Foley Continue Flomax  0.4, WIll need OP Voiding trial  Complete heart block paroxysmal A-fib PPM in place HFrEF 25% Stopped metoprolol  XL 25, --holding --->Demadex  20 hydralazine  10 for now Continues Elliquis 2.5 twice daily -  Diabetes mellitus with with recent admission for DKA (Home meds 7 units twice daily long-acting insulin ) Will have to place on every 4 coverage given likely will be pretty much n.p.o. Keep Lantus  5, place on every 4 coverage  Vascular dementia-CSF autoimmune panel was not obtainable Large workup performed as per progress note/discharge summary 11/08/2023 Will  need outpatient follow-up with Dr. Skeet who is treating him for combined vascular dementia etc. Resume cyanocobalamin  1000 folic acid  1 mg when more awake  Data Reviewed today: Sodium 160 potassium 3.9 chloride 119 BUN/creatinine 21/2.6  DVT prophylaxis: SCD   Dispo/Global plan: Discussed with son in detail--liekly back to [facility] am once Neuro clears    Subjective:   More awake--eating some  Objective + exam Vitals:   05/05/24 2019 05/06/24 0459 05/06/24 0736 05/06/24 0934  BP: 119/71 (!) 108/59 (!) 93/58 103/60  Pulse: 71 73 71 73  Resp: 18 18 16 20   Temp: 97.6 F (36.4 C) 97.7 F (36.5 C) 97.8 F (36.6 C) 97.8 F (36.6 C)  TempSrc:    Oral  SpO2: 100% 100% 100% 100%  Weight:      Height:       Filed Weights   05/05/24 0219  Weight: 69.1 kg   Examination:  Very dry mucosa poor dentition Chest is clear Abdomen soft S1-S2 no murmur pacer in place-telemetry reviewed with bundle branch block Power 5/5 no focal defciit but facial droop persists on the L side  Scheduled Meds:  allopurinol   100 mg Oral Daily   apixaban   2.5 mg Oral BID   Chlorhexidine  Gluconate Cloth  6 each Topical Daily   insulin  aspart  0-24 Units Subcutaneous Q4H   insulin  glargine  5 Units Subcutaneous Daily   potassium chloride   60 mEq Oral Once   tamsulosin   0.4 mg Oral Daily   Continuous Infusions:  lactated ringers  75 mL/hr at 05/06/24 9378    Time 40  Colen Grimes, MD  [facility]

## 2024-05-06 NOTE — Progress Notes (Signed)
" °   05/06/24 0934  Assess: MEWS Score  Temp 97.8 F (36.6 C)  BP 103/60  MAP (mmHg) 74  Pulse Rate 73  Resp 20  SpO2 100 %  O2 Device Room Air  Assess: if the MEWS score is Yellow or Red  Were vital signs accurate and taken at a resting state? Yes  Does the patient meet 2 or more of the SIRS criteria? No  Notify: Charge Nurse/RN  Name of Charge Nurse/RN Notified Samon Dishner self    "

## 2024-05-06 NOTE — Progress Notes (Signed)
" °   05/06/24 0934  Assess: MEWS Score  Temp 97.8 F (36.6 C)  BP 103/60  MAP (mmHg) 74  Pulse Rate 73  Resp (!) 22  SpO2 100 %  O2 Device Room Air  Assess: MEWS Score  MEWS Temp 0  MEWS Systolic 0  MEWS Pulse 0  MEWS RR 1  MEWS LOC 1  MEWS Score 2  MEWS Score Color Yellow  Assess: if the MEWS score is Yellow or Red  Were vital signs accurate and taken at a resting state? Yes  Does the patient meet 2 or more of the SIRS criteria? No  Notify: Charge Nurse/RN  Name of Charge Nurse/RN Notified Anaijah Augsburger self  Assess: SIRS CRITERIA  SIRS Temperature  0  SIRS Respirations  1  SIRS Pulse 0  SIRS WBC 0  SIRS Score Sum  1   Rechecked vitals. Green MEWS. "

## 2024-05-07 ENCOUNTER — Inpatient Hospital Stay (HOSPITAL_COMMUNITY)

## 2024-05-07 DIAGNOSIS — I4891 Unspecified atrial fibrillation: Secondary | ICD-10-CM | POA: Diagnosis not present

## 2024-05-07 DIAGNOSIS — E870 Hyperosmolality and hypernatremia: Secondary | ICD-10-CM | POA: Diagnosis not present

## 2024-05-07 DIAGNOSIS — R29720 NIHSS score 20: Secondary | ICD-10-CM

## 2024-05-07 DIAGNOSIS — I63542 Cerebral infarction due to unspecified occlusion or stenosis of left cerebellar artery: Secondary | ICD-10-CM | POA: Diagnosis not present

## 2024-05-07 DIAGNOSIS — Z7901 Long term (current) use of anticoagulants: Secondary | ICD-10-CM | POA: Diagnosis not present

## 2024-05-07 DIAGNOSIS — N179 Acute kidney failure, unspecified: Secondary | ICD-10-CM

## 2024-05-07 DIAGNOSIS — E860 Dehydration: Secondary | ICD-10-CM | POA: Diagnosis not present

## 2024-05-07 DIAGNOSIS — I6389 Other cerebral infarction: Secondary | ICD-10-CM

## 2024-05-07 LAB — BASIC METABOLIC PANEL WITH GFR
Anion gap: 9 (ref 5–15)
BUN: 29 mg/dL — ABNORMAL HIGH (ref 8–23)
CO2: 24 mmol/L (ref 22–32)
Calcium: 8.7 mg/dL — ABNORMAL LOW (ref 8.9–10.3)
Chloride: 113 mmol/L — ABNORMAL HIGH (ref 98–111)
Creatinine, Ser: 1.77 mg/dL — ABNORMAL HIGH (ref 0.61–1.24)
GFR, Estimated: 37 mL/min — ABNORMAL LOW
Glucose, Bld: 99 mg/dL (ref 70–99)
Potassium: 3.7 mmol/L (ref 3.5–5.1)
Sodium: 146 mmol/L — ABNORMAL HIGH (ref 135–145)

## 2024-05-07 LAB — GLUCOSE, CAPILLARY
Glucose-Capillary: 240 mg/dL — ABNORMAL HIGH (ref 70–99)
Glucose-Capillary: 87 mg/dL (ref 70–99)
Glucose-Capillary: 112 mg/dL — ABNORMAL HIGH (ref 70–99)
Glucose-Capillary: 279 mg/dL — ABNORMAL HIGH (ref 70–99)
Glucose-Capillary: 88 mg/dL (ref 70–99)

## 2024-05-07 NOTE — TOC Initial Note (Addendum)
 Transition of Care [facility]) - Initial/Assessment Note    Patient Details  Name: Connor Dixon MRN: [id number] Date of Birth: December 20, 1934  [facility] CM/SW Contact:    Lendia Dais, LCSWA Phone Number: 05/07/2024, 12:35 PM  Clinical Narrative: Pt is from [facility] and will return upon discharge.  Pt needs assistance w/ ADL's and has the DME of a cane, walker, wc, and recliner. Pt HCPOA is the sons [name] and [name].  Pt is not yet medically stable and pending a MRI and may discharge tomorrow per MD.  CSW called and left a VM for Therisa of [facility].  1538 - CSW spoke to Precious at [facility] who stated once the pt is discharge to send a new FL2, DC summary to fax [phone]. Precious states report number is [phone].  CSW will continue to monitor.                    Expected Discharge Plan: Assisted Living     Patient Goals and CMS Choice Patient states their goals for this hospitalization and ongoing recovery are:: pt oriented x1 only          Expected Discharge Plan and Services In-house Referral: Clinical Social Work     Living arrangements for the past 2 months: Assisted Living Facility                                      Prior Living Arrangements/Services Living arrangements for the past 2 months: Assisted Living Facility Lives with:: Facility Resident Patient language and need for interpreter reviewed:: Yes Do you feel safe going back to the place where you live?:  (pt disoriented x1)      Need for Family Participation in Patient Care: Yes (Comment) Care giver support system in place?: Yes (comment) Current home services: DME Criminal Activity/Legal Involvement Pertinent to Current Situation/Hospitalization: No - Comment as needed  Activities of Daily Living   ADL Screening (condition at time of admission) Independently performs ADLs?: No Does the patient have a NEW difficulty with  bathing/dressing/toileting/self-feeding that is expected to last >3 days?: No Does the patient have a NEW difficulty with getting in/out of bed, walking, or climbing stairs that is expected to last >3 days?: No Does the patient have a NEW difficulty with communication that is expected to last >3 days?: Yes (Initiates electronic notice to provider for possible SLP consult) Is the patient deaf or have difficulty hearing?: Yes Does the patient have difficulty seeing, even when wearing glasses/contacts?: No Does the patient have difficulty concentrating, remembering, or making decisions?: Yes  Permission Sought/Granted Permission sought to share information with : Family Supports, Magazine Features Editor Permission granted to share information with : No  Share Information with NAME: Kaesyn Johnston     Permission granted to share info w Relationship: son  Permission granted to share info w Contact Information: [phone]  Emotional Assessment Appearance:: Appears stated age Attitude/Demeanor/Rapport: Unable to Assess Affect (typically observed): Unable to Assess Orientation: : Oriented to Self Alcohol  / Substance Use: Not Applicable Psych Involvement: No (comment)  Admission diagnosis:  Dehydration [E86.0] Hypernatremia [E87.0] Patient Active Problem List   Diagnosis Date Noted   Dehydration 05/04/2024   Diabetic ketoacidosis (HCC) 04/23/2024   Chronic heart failure with reduced ejection fraction (HFrEF, <= 40%) (HCC) 01/25/2024   Acute cystitis with hematuria 01/25/2024   Acute exacerbation of  CHF (congestive heart failure) (HCC) 12/27/2023   Acute on chronic heart failure with preserved ejection fraction (HFpEF) (HCC) 12/26/2023   Acute kidney injury superimposed on chronic kidney disease 12/26/2023   Heart block AV complete (HCC) 12/26/2023   Palliative care by specialist 10/16/2023   Acute encephalopathy 10/10/2023   Elevated troponin 10/10/2023   Prolonged QT interval  10/10/2023   CKD stage 4 due to type 2 diabetes mellitus (HCC) 09/06/2023   Seasonal allergic rhinitis due to pollen 09/06/2023   Post-traumatic osteoarthritis of left shoulder 01/05/2023   Paroxysmal atrial fibrillation (HCC) 09/03/2021   Diabetic nephropathy associated with type 2 diabetes mellitus (HCC) 06/09/2021   Vitamin D  deficiency disease 06/09/2021   Insulin -requiring or dependent type II diabetes mellitus (HCC) 06/09/2021   Encounter for general adult medical examination with abnormal findings 06/09/2020   Sinus node dysfunction (HCC) 02/13/2019   Presence of permanent cardiac pacemaker 07/22/2010   Hyperlipidemia with target LDL less than 70 08/06/2007   Hypertension associated with diabetes (HCC) 08/06/2007   PCP:  [name]:   [facility], [place] - [address] [place] [address] Phone: [phone] Fax: [phone]  [place] [facility] [address] [place] [address] Phone: [phone] Fax: [phone]     Social Drivers of Health (SDOH) Social History: SDOH Screenings   Food Insecurity: Patient Unable To Answer (05/05/2024)  Housing: Patient Unable To Answer (05/05/2024)  Transportation Needs: Patient Unable To Answer (05/05/2024)  Utilities: Patient Unable To Answer (05/05/2024)  Alcohol  Screen: Low Risk (04/06/2023)  Depression (PHQ2-9): Low Risk (09/06/2023)  Financial Resource Strain: Low Risk (04/06/2023)  Physical Activity: Inactive (04/06/2023)  Social Connections: Patient Unable To Answer (05/05/2024)  Stress: No Stress Concern Present (04/06/2023)  Tobacco Use: Low Risk (05/04/2024)  Health Literacy: Adequate Health Literacy (04/06/2023)   SDOH Interventions:     Readmission Risk Interventions     No data to display

## 2024-05-07 NOTE — Progress Notes (Signed)
 MRI shows a new set of CVA as per report. Neurology consulted and family is aware of the same in terms of need for workup  Holding aspirin  Plavix at this time as is already on Eliquis  Last lipid panel 04/23/2024 showed LDL of 122 so would not repeat   Await further recommendations from neurology    Reggie Grimes, MD [facility] 6:30 PM

## 2024-05-07 NOTE — Plan of Care (Signed)

## 2024-05-07 NOTE — Progress Notes (Signed)
 [name]:[id number]  DOB: September 05, 1934  DOA: 05/04/2024  PCP: [name]  05/07/2024,11:26 AM  LOS: 3 days    Code Status: Full code     from: [facility]   89 male HTN HLD  complete heart block/PA fib Chad2> 4  with PPM and HFrEF EF 25-30% DM TY 2--- previously has had Proteus UTI with hematuria Known Encephalopathy with some Parkinsonism/Vascular dementia Recent hospitalization 12/9-12/12 for urinary retention DKA in the setting of moderate constipation-thought was also for sepsis Treated for DKA started on dysphagia 1 diet and needed to be fed-transitioned to doxycycline  at discharge completing 5 days antibiotics and discharged back to facility  Sent over from [facility] because of abnormal labs-MAR seems to indicate that he is on Demadex    Assessment  & Plan :    Toxic metabolic encephalopathy 2/2 AKI/severe hyponatremia Resolving well----now LR 75 cc/h Resumed majority of necessary meds as below  Facial droop L sided--Ct head neg for CVA D/w son who tells me usually is not there--first obs during hospitalization may 2025---extensive w/u including LP etc etc Recurred after recent DKA admit Will ask neuro a non-emergent opinion--they mention get MRI  Transient urinary retention Retaining about 400 cc earlier but was able to pass urine when they were about to place Foley Continue Flomax  0.4, WIll need OP Voiding trial  Complete heart block paroxysmal A-fib PPM in place HFrEF 25% Stopped metoprolol  XL 25, --holding --->Demadex  20 hydralazine  10 for now Continues Elliquis 2.5 twice daily -  Diabetes mellitus with with recent admission for DKA (Home meds 7 units twice daily long-acting insulin ) Will have to place on every 4 coverage given likely will be pretty much n.p.o. Keep Lantus  5, place on every 4 coverage  Vascular dementia-CSF autoimmune panel was not obtainable Large workup performed as per progress note/discharge summary 11/08/2023 Will  need outpatient follow-up with Dr. Skeet who is treating him for combined vascular dementia etc. Resume cyanocobalamin  1000 folic acid  1 mg when more awake  Data Reviewed today: Sodium 160 potassium 3.9 chloride 119 BUN/creatinine 21/2.6  DVT prophylaxis: SCD   Dispo/Global plan: Discussed with son in detail--liekly back to [facility] am once Neuro clears    Subjective:   More awake--eating some  Objective + exam Vitals:   05/06/24 1614 05/06/24 1949 05/07/24 0437 05/07/24 0816  BP: 111/61 101/67 109/67 112/72  Pulse: 83 89 92 84  Resp: 18 16 18 19   Temp: 98 F (36.7 C) 97.9 F (36.6 C) 98.1 F (36.7 C) 98.3 F (36.8 C)  TempSrc:  Oral    SpO2: 100% 100% 100% 100%  Weight:      Height:       Filed Weights   05/05/24 0219  Weight: 69.1 kg   Examination:  Very dry mucosa poor dentition Chest is clear Abdomen soft S1-S2 no murmur pacer in place-telemetry reviewed with bundle branch block Power 5/5 no focal defciit but facial droop persists on the L side  Scheduled Meds:  allopurinol   100 mg Oral Daily   apixaban   2.5 mg Oral BID   Chlorhexidine  Gluconate Cloth  6 each Topical Daily   insulin  aspart  0-24 Units Subcutaneous Q4H   insulin  glargine  5 Units Subcutaneous Daily   potassium chloride   60 mEq Oral Once   tamsulosin   0.4 mg Oral Daily   Continuous Infusions:    Time 40  Jai-Gurmukh Denzil Mceachron, MD  [facility]

## 2024-05-07 NOTE — Care Management Important Message (Signed)
 Important Message  Patient Details  Name: QUANTEL MCINTURFF MRN: [id number] Date of Birth: [birth year]   Important Message Given:  Yes - Medicare IM     Claretta Deed 05/07/2024, 12:09 PM

## 2024-05-07 NOTE — Progress Notes (Signed)
.. ° ° °  PROCEDURAL EXPEDITER PROGRESS NOTE  Patient Name: Connor Dixon  DOB:November 12, 1934 Date of Admission: 05/04/2024  Date of Assessment:05/07/2024   -------------------------------------------------------------------------------------------------------------------   Brief clinical summary: Pt needing MRI done for L sided facial droop  Orders in place:  Yes   Barriers noted: Pt waiting for since yesterday afternoon to get MRI done   Intervention provided by [facility] team: Reached out to MRI and found out since pt has pacemaker, they weren't able to get it done last night because there is no nurse nor rep at night. Made MD aware of this and if he changed order to STAT, pt will get MRI done faster  Barrier resolved:  yes   -------------------------------------------------------------------------------------------------------------------  [facility], [place], [place] Please contact us  directly via secure chat (search for [facility]) or by calling us  at [phone] [facility]).

## 2024-05-07 NOTE — Care Management Important Message (Signed)
 Important Message  Patient Details  Name: Connor Dixon MRN: [id number] Date of Birth: [birth year]   Important Message Given:  Yes - Medicare IM     Claretta Deed 05/07/2024, 12:11 PM

## 2024-05-07 NOTE — CV Procedure (Signed)
" °  Device system confirmed to be MRI conditional, with implant date > 6 weeks ago, and no evidence of abandoned or epicardial leads in review of most recent CXR  Device last cleared by EP Provider: Evetta Needle 05/07/24  Clearance is good through for 1 year as long as parameters remain stable at time of check. If pt undergoes a cardiac device procedure during that time, they should be re-cleared.   Tachy-therapies to be programmed off if applicable with device back to pre-MRI settings after completion of exam.  [employer] will be present for programming for the MRI.   Emogene Bouchard, RT  05/07/2024 10:00 AM     "

## 2024-05-07 NOTE — Consult Note (Addendum)
 NEUROLOGY CONSULT NOTE   Date of service: May 07, 2024 Patient Name: Connor Dixon MRN:  [id number] DOB:  [birth year] Chief Complaint: Strokes on MRI brain Requesting Provider: Royal Sill, MD  History of Present Illness  Connor Dixon is a 89 y.o. male with hx of DM2, HTN, HLD, Afibb on eliquis  and complete heart block, vascular dementia who is currently admitted with hypernatremia and AKI. He was noted to have a L facial droop for which MRI brain obtained and shows a subacute L cerebellar stroke along with a small R white matter stroke.  Pt unable to provide any hx. He essentially moans and groans upon awakening and at time yells at me and asking to be left alone. I spoke  with son Mr. [name] who reports pt was doing good over the last few months before he had DKA 2 weeks ago and had significantly regressed. Was discharged to his facility and not eating adrinking a lot. They have noticed a left facial droop at the facility too.  Pt was evaluated by neurology in sept of this year and diagnosed with underlying dementia.  LKW: 2 weeks ago Modified rankin score: 4-Needs assistance to walk and tend to bodily needs IV Thrombolysis: not offered, outside window and on eliquis . EVT: not offered, outside window   NIHSS components Score: Comment  1a Level of Conscious 0[]  1[]  2[x]  3[]      1b LOC Questions 0[]  1[]  2[x]       1c LOC Commands 0[]  1[]  2[x]       2 Best Gaze 0[x]  1[]  2[]       3 Visual 0[x]  1[]  2[]  3[]      4 Facial Palsy 0[]  1[x]  2[]  3[]      5a Motor Arm - left 0[]  1[]  2[x]  3[]  4[]  UN[]    5b Motor Arm - Right 0[]  1[]  2[x]  3[]  4[]  UN[]    6a Motor Leg - Left 0[]  1[]  2[x]  3[]  4[]  UN[]    6b Motor Leg - Right 0[]  1[]  2[x]  3[]  4[]  UN[]    7 Limb Ataxia 0[x]  1[]  2[]  UN[]      8 Sensory 0[x]  1[]  2[]  UN[]      9 Best Language 0[]  1[]  2[]  3[x]      10 Dysarthria 0[]  1[]  2[x]  UN[]      11 Extinct. and Inattention 0[x]  1[]  2[]       TOTAL: 20      ROS  Unable to ascertain due to  encephalopathy.  Past History   Past Medical History:  Diagnosis Date   Acute on chronic diastolic congestive heart failure (HCC)    Arthus phenomenon    Atrioventricular block, complete (HCC)    Cardiac pacemaker in situ 07/22/2010   Chronic kidney disease, stage 3 (HCC)    Presumably from longstanding DM & HTN. Scr has gradually increased over the past 2 years (1.6 in 12/2010, 2 in 03/2011, and up to 2.1 in 01/2013)   DIABETES MELLITUS, TYPE I, ADULT ONSET 08/06/2007   DM (diabetes mellitus) (HCC)    adult onset    Essential hypertension, benign 08/06/2007   Very good BP control   History of second degree heart block    HLD (hyperlipidemia)    HYPERLIPIDEMIA 08/06/2007   Hypopotassemia    Morbid obesity (HCC)    Obesity    Other and unspecified angina pectoris    typical   Type I (juvenile type) diabetes mellitus without mention of complication, not stated as uncontrolled    (04/25/13 OV with Dr. Rayburn) = Poorly controlled &  is working  w/Dr. Karlos to improve this. Most recent Hgb A1c was 8.2% but Mr. Mathers reports it was down to 7.6 last month.    Past Surgical History:  Procedure Laterality Date   CATARACT EXTRACTION  8/08   [facility]   PPM GENERATOR CHANGEOUT N/A 02/13/2019   Procedure: PPM GENERATOR CHANGEOUT;  Surgeon: Waddell Danelle ORN, MD;  Location: [facility] INVASIVE CV LAB;  Service: Cardiovascular;  Laterality: N/A;   PTVDP  12/11   PPM - [employer]   RIGHT HEART CATH N/A 12/29/2023   Procedure: RIGHT HEART CATH;  Surgeon: Cherrie Toribio SAUNDERS, MD;  Location: [facility] INVASIVE CV LAB;  Service: Cardiovascular;  Laterality: N/A;   TOTAL HIP ARTHROPLASTY  04/05/06   right    Family History: Family History  Problem Relation Age of Onset   Asthma Father    Coronary artery disease Father    Diabetes Father    Hyperlipidemia Father    Stroke Father 78   Coronary artery disease Mother    Asthma Mother    Heart attack Mother 11   Colon cancer Neg Hx        no definate  prostate CA     Social History  reports that he has never smoked. He has never used smokeless tobacco. He reports that he does not drink alcohol  and does not use drugs.  Allergies[1]  Medications  Current Medications[2]  Vitals   Vitals:   05/07/24 0816 05/07/24 1421 05/07/24 1722 05/07/24 2031  BP: 112/72 108/65 124/81 106/65  Pulse: 84 96 (!) 110 93  Resp: 19 19 19 18   Temp: 98.3 F (36.8 C) 98.1 F (36.7 C) 98.3 F (36.8 C) 98.2 F (36.8 C)  TempSrc:      SpO2: 100% 98% 100% 99%  Weight:      Height:        Body mass index is 23.16 kg/m.   Physical Exam   General: Laying comfortably in bed; in no acute distress.  HENT: Normal oropharynx and mucosa. Normal external appearance of ears and nose.  Neck: Supple, no pain or tenderness  CV: No JVD. No peripheral edema.  Pulmonary: Symmetric Chest rise. Normal respiratory effort.  Abdomen: Soft to touch, non-tender.  Ext: No cyanosis, edema, or deformity  Skin: No rash. Normal palpation of skin.   Musculoskeletal: Normal digits and nails by inspection. No clubbing.   Neurologic Examination  Mental status/Cognition: encephalopathic, somnolent, oriented to self only. Speech/language: dysarthric speech, asks me to stop bothering him and requesting to be left alone. Does not follow commands or answer questions. Cranial nerves:   CN II Pupils equal and reactive to light, no VF deficits   CN III,IV,VI EOM intact to dolls eyes, no gaze preference or deviation, no nystagmus    CN V normal sensation in V1, V2, and V3 segments bilaterally    CN VII L facial droop   CN VIII normal hearing to speech    CN IX & X Protecting his airway   CN XI Head is midline   CN XII Does not protrude tongue on command.   Sensory/Motor:  Muscle bulk: poor, tone normal. Localizes in BL uppers with antigravity movements noted. Withdraws BL lowers with antigravity movements noted.  Coordination/Complex Motor:  - Unable to  assess.  Labs/Imaging/Neurodiagnostic studies   CBC:  Recent Labs  Lab 16-May-2024 1813  WBC 7.6  NEUTROABS 4.8  HGB 15.3  HCT 48.8  MCV 100.0  PLT 218   Basic Metabolic  Panel:  Lab Results  Component Value Date   NA 146 (H) 05/07/2024   K 3.7 05/07/2024   CO2 24 05/07/2024   GLUCOSE 99 05/07/2024   BUN 29 (H) 05/07/2024   CREATININE 1.77 (H) 05/07/2024   CALCIUM 8.7 (L) 05/07/2024   GFRNONAA 37 (L) 05/07/2024   GFRAA 32 (L) 12/10/2019   Lipid Panel:  Lab Results  Component Value Date   LDLCALC 122 (H) 04/23/2024   HgbA1c:  Lab Results  Component Value Date   HGBA1C 9.1 (H) 01/27/2024   Urine Drug Screen:     Component Value Date/Time   LABOPIA NONE DETECTED 10/10/2023 1747   COCAINSCRNUR NONE DETECTED 10/10/2023 1747   LABBENZ NONE DETECTED 10/10/2023 1747   AMPHETMU NONE DETECTED 10/10/2023 1747   THCU NONE DETECTED 10/10/2023 1747   LABBARB NONE DETECTED 10/10/2023 1747    Alcohol  Level     Component Value Date/Time   ETH <15 12/26/2023 0659   INR  Lab Results  Component Value Date   INR 1.1 04/23/2024   APTT  Lab Results  Component Value Date   APTT 37 (H) 10/10/2023   AED levels: No results found for: PHENYTOIN, ZONISAMIDE, LAMOTRIGINE, LEVETIRACETA  CT Head without contrast(Personally reviewed): CTH was negative for a large hypodensity concerning for a large territory infarct or hyperdensity concerning for an ICH   MR Angio head without contrast and Carotid Duplex BL(Personally reviewed): pending  MRI Brain(Personally reviewed): 1. Small acute infarct in the right frontal white matter. 2. Suspected subacute left cerebellar infarct. 3. Moderate chronic microvascular ischemic change and cerebral atrophy.  ASSESSMENT   Connor Dixon is a 89 y.o. male ith hx of DM2, HTN, HLD, Afibb on eliquis  and complete heart block, vascular dementia who is currently admitted with hypernatremia and AKI. He was noted to have a L facial droop for  which MRI brain obtained and shows a subacute L cerebellar stroke along with a small R white matter stroke.  Given patient is on eliquis , which is maximal medical management, he is unlikely to benefit from full stroke workup as I do not feel this will change management significantly.  RECOMMENDATIONS  - Frequent Neuro checks per stroke unit protocol - no need for TTE from a stroke standpoint, as he is on eliquis  already - Recommend obtaining Lipid panel with LDL, pt has statin intolerance however. - Recommend HbA1c to evaluate for diabetes and how well it is controlled. - continue Eliquis . Stroke is small and low overall risk for hemorrhage - SBP goal - aim for gradual normotension - Recommend Telemetry monitoring for arrythmia - Recommend bedside swallow screen prior to PO intake. - Stroke education booklet - Recommend PT/OT/SLP consult ______________________________________________________________________  Plan discussed with patient's son over phone.  Signed, Johnathon Mittal, MD [facility]     [1]  Allergies Allergen Reactions   Shellfish Allergy Hives and Swelling   Citrus Hives    Just lemons   Pecan Extract Hives   Apple Other (See Comments)    Sore throat   Cabbage Other (See Comments)    Unknown reaction - Not listed on the [facility]   Lemon Oil Hives   Walnut Other (See Comments)    Unknown reaction - Not listed on the [facility]   Farxiga  [Dapagliflozin ] Other (See Comments)    Low blood sugar - Not listed on the [facility]  [2]  Current Facility-Administered Medications:    allopurinol  (ZYLOPRIM ) tablet 100 mg, 100 mg, Oral, Daily, Dorrell, Robert, MD, 100  mg at 05/07/24 9093   apixaban  (ELIQUIS ) tablet 2.5 mg, 2.5 mg, Oral, BID, Dorrell, Robert, MD, 2.5 mg at 05/07/24 2158   Chlorhexidine  Gluconate Cloth 2 % PADS 6 each, 6 each, Topical, Daily, Samtani, Jai-Gurmukh, MD, 6 each at 05/07/24 1001   CBG monitoring, , , Q4H **AND** insulin  aspart (novoLOG ) injection  0-24 Units, 0-24 Units, Subcutaneous, Q4H, Dorrell, Robert, MD, 8 Units at 05/08/24 0027   insulin  glargine (LANTUS ) injection 5 Units, 5 Units, Subcutaneous, Daily, Dorrell, Robert, MD, 5 Units at 05/07/24 1226   potassium chloride  (KLOR-CON ) packet 60 mEq, 60 mEq, Oral, Once, Dorrell, Robert, MD   tamsulosin  (FLOMAX ) capsule 0.4 mg, 0.4 mg, Oral, Daily, Dorrell, Robert, MD, 0.4 mg at 05/07/24 [phone]

## 2024-05-07 NOTE — Consult Note (Incomplete)
 NEUROLOGY CONSULT NOTE   Date of service: May 07, 2024 Patient Name: Connor Dixon MRN:  [id number] DOB:  1935/03/24 Chief Complaint: Strokes on MRI brain Requesting Provider: Royal Sill, MD  History of Present Illness  RITIK STAVOLA is a 89 y.o. male with hx of DM2, HTN, HLD, Afibb on eliquis  and complete heart block, vascular dementia who is currently admitted with hypernatremia and AKI. He was noted to have a L facial droop for which MRI brain obtained and shows a subacute L cerebellar stroke along with a small R white matter stroke.  In late may, episode and found unresponsive. Was living independently. EMT called and found to have hypoglycemia. ?neurological. Seen at [facility]. Progressed to a point where he was eating on his own. Was fairly well. Have been having difficult controlling his blood sugar. 2 weeks ago, went into DKA.  Discharged to facility and has a droop for the last 2 weeks.  LKW: *** Modified rankin score: {Modified Rankin Scale:21264} IV Thrombolysis: ***Yes, *** No (reason) EVT: ***Yes, *** No (reason) ICH Score:***  NIHSS components Score: Comment  1a Level of Conscious 0[]  1[]  2[]  3[]      1b LOC Questions 0[]  1[]  2[]       1c LOC Commands 0[]  1[]  2[]       2 Best Gaze 0[]  1[]  2[]       3 Visual 0[]  1[]  2[]  3[]      4 Facial Palsy 0[]  1[]  2[]  3[]      5a Motor Arm - left 0[]  1[]  2[]  3[]  4[]  UN[]    5b Motor Arm - Right 0[]  1[]  2[]  3[]  4[]  UN[]    6a Motor Leg - Left 0[]  1[]  2[]  3[]  4[]  UN[]    6b Motor Leg - Right 0[]  1[]  2[]  3[]  4[]  UN[]    7 Limb Ataxia 0[]  1[]  2[]  UN[]      8 Sensory 0[]  1[]  2[]  UN[]      9 Best Language 0[]  1[]  2[]  3[]      10 Dysarthria 0[]  1[]  2[]  UN[]      11 Extinct. and Inattention 0[]  1[]  2[]       TOTAL:       ROS  ***Comprehensive ROS performed and pertinent positives documented in HPI  ***Unable to ascertain due to ***  Past History   Past Medical History:  Diagnosis Date   Acute on chronic diastolic  congestive heart failure (HCC)    Arthus phenomenon    Atrioventricular block, complete (HCC)    Cardiac pacemaker in situ 07/22/2010   Chronic kidney disease, stage 3 (HCC)    Presumably from longstanding DM & HTN. Scr has gradually increased over the past 2 years (1.6 in 12/2010, 2 in 03/2011, and up to 2.1 in 01/2013)   DIABETES MELLITUS, TYPE I, ADULT ONSET 08/06/2007   DM (diabetes mellitus) (HCC)    adult onset    Essential hypertension, benign 08/06/2007   Very good BP control   History of second degree heart block    HLD (hyperlipidemia)    HYPERLIPIDEMIA 08/06/2007   Hypopotassemia    Morbid obesity (HCC)    Obesity    Other and unspecified angina pectoris    typical   Type I (juvenile type) diabetes mellitus without mention of complication, not stated as uncontrolled    (04/25/13 OV with Dr. Rayburn) = Poorly controlled & is working  w/Dr. Karlos to improve this. Most recent Hgb A1c was 8.2% but Mr. Ferdig reports it was down to 7.6 last month.  Past Surgical History:  Procedure Laterality Date   CATARACT EXTRACTION  8/08   [facility]   PPM GENERATOR CHANGEOUT N/A 02/13/2019   Procedure: PPM GENERATOR CHANGEOUT;  Surgeon: Waddell Danelle ORN, MD;  Location: [facility] INVASIVE CV LAB;  Service: Cardiovascular;  Laterality: N/A;   PTVDP  12/11   PPM - [employer]   RIGHT HEART CATH N/A 12/29/2023   Procedure: RIGHT HEART CATH;  Surgeon: Cherrie Toribio SAUNDERS, MD;  Location: [facility] INVASIVE CV LAB;  Service: Cardiovascular;  Laterality: N/A;   TOTAL HIP ARTHROPLASTY  04/05/06   right    Family History: Family History  Problem Relation Age of Onset   Asthma Father    Coronary artery disease Father    Diabetes Father    Hyperlipidemia Father    Stroke Father 53   Coronary artery disease Mother    Asthma Mother    Heart attack Mother 5   Colon cancer Neg Hx        no definate prostate CA     Social History  reports that he has never smoked. He has  never used smokeless tobacco. He reports that he does not drink alcohol  and does not use drugs.  Allergies[1]  Medications  Current Medications[2]  Vitals   Vitals:   05/07/24 0437 05/07/24 0816 05/07/24 1421 05/07/24 1722  BP: 109/67 112/72 108/65 124/81  Pulse: 92 84 96 (!) 110  Resp: 18 19 19 19   Temp: 98.1 F (36.7 C) 98.3 F (36.8 C) 98.1 F (36.7 C) 98.3 F (36.8 C)  TempSrc:      SpO2: 100% 100% 98% 100%  Weight:      Height:        Body mass index is 23.16 kg/m.   Physical Exam   Constitutional: Appears well-developed and well-nourished. *** Psych: Affect appropriate to situation. *** Eyes: No scleral injection. *** HENT: No OP obstruction. *** Head: Normocephalic. *** Cardiovascular: Normal rate and regular rhythm. *** Respiratory: Effort normal, non-labored breathing. *** GI: Soft.  No distension. There is no tenderness. *** Skin: WDI. ***  Neurologic Examination   ***  Labs/Imaging/Neurodiagnostic studies   CBC:  Recent Labs  Lab May 15, 2024 1813  WBC 7.6  NEUTROABS 4.8  HGB 15.3  HCT 48.8  MCV 100.0  PLT 218   Basic Metabolic Panel:  Lab Results  Component Value Date   NA 146 (H) 05/07/2024   K 3.7 05/07/2024   CO2 24 05/07/2024   GLUCOSE 99 05/07/2024   BUN 29 (H) 05/07/2024   CREATININE 1.77 (H) 05/07/2024   CALCIUM 8.7 (L) 05/07/2024   GFRNONAA 37 (L) 05/07/2024   GFRAA 32 (L) 12/10/2019   Lipid Panel:  Lab Results  Component Value Date   LDLCALC 122 (H) 04/23/2024   HgbA1c:  Lab Results  Component Value Date   HGBA1C 9.1 (H) 01/27/2024   Urine Drug Screen:     Component Value Date/Time   LABOPIA NONE DETECTED 10/10/2023 1747   COCAINSCRNUR NONE DETECTED 10/10/2023 1747   LABBENZ NONE DETECTED 10/10/2023 1747   AMPHETMU NONE DETECTED 10/10/2023 1747   THCU NONE DETECTED 10/10/2023 1747   LABBARB NONE DETECTED 10/10/2023 1747    Alcohol  Level     Component Value Date/Time   ETH <15 12/26/2023 0659   INR  Lab  Results  Component Value Date   INR 1.1 04/23/2024   APTT  Lab Results  Component Value Date   APTT 37 (H) 10/10/2023   AED levels: No results found  for: PHENYTOIN, ZONISAMIDE, LAMOTRIGINE, LEVETIRACETA  CT Head without contrast(Personally reviewed): ***  CT angio Head and Neck with contrast(Personally reviewed): ***  MR Angio head without contrast and Carotid Duplex BL(Personally reviewed): ***  MRI Brain(Personally reviewed): ***  Neurodiagnostics rEEG:  ***  ASSESSMENT   CORIE VAVRA is a 89 y.o. male ***  RECOMMENDATIONS  *** ______________________________________________________________________    Bonney Ellouise Mari, MD [facility]       [1] Allergies Allergen Reactions   Shellfish Allergy Hives and Swelling   Citrus Hives    Just lemons   Pecan Extract Hives   Apple Other (See Comments)    Sore throat   Cabbage Other (See Comments)    Unknown reaction - Not listed on the [facility]   Lemon Oil Hives   Walnut Other (See Comments)    Unknown reaction - Not listed on the [facility]   Farxiga  [Dapagliflozin ] Other (See Comments)    Low blood sugar - Not listed on the [facility]  [2]  Current Facility-Administered Medications:    allopurinol  (ZYLOPRIM ) tablet 100 mg, 100 mg, Oral, Daily, Dorrell, Robert, MD, 100 mg at 05/07/24 9093   apixaban  (ELIQUIS ) tablet 2.5 mg, 2.5 mg, Oral, BID, Dorrell, Robert, MD, 2.5 mg at 05/07/24 0906   Chlorhexidine  Gluconate Cloth 2 % PADS 6 each, 6 each, Topical, Daily, Samtani, Jai-Gurmukh, MD, 6 each at 05/07/24 1001   CBG monitoring, , , Q4H **AND** insulin  aspart (novoLOG ) injection 0-24 Units, 0-24 Units, Subcutaneous, Q4H, Dorrell, Robert, MD, 12 Units at 05/07/24 1759   insulin  glargine (LANTUS ) injection 5 Units, 5 Units, Subcutaneous, Daily, Dorrell, Robert, MD, 5 Units at 05/07/24 1226   potassium chloride  (KLOR-CON ) packet 60 mEq, 60 mEq, Oral, Once, Dorrell, Robert, MD   tamsulosin   (FLOMAX ) capsule 0.4 mg, 0.4 mg, Oral, Daily, Dorrell, Robert, MD, 0.4 mg at 05/07/24 [phone]

## 2024-05-08 DIAGNOSIS — I5022 Chronic systolic (congestive) heart failure: Secondary | ICD-10-CM

## 2024-05-08 DIAGNOSIS — E860 Dehydration: Secondary | ICD-10-CM | POA: Diagnosis not present

## 2024-05-08 DIAGNOSIS — Z950 Presence of cardiac pacemaker: Secondary | ICD-10-CM | POA: Diagnosis not present

## 2024-05-08 DIAGNOSIS — Z7901 Long term (current) use of anticoagulants: Secondary | ICD-10-CM | POA: Diagnosis not present

## 2024-05-08 DIAGNOSIS — I480 Paroxysmal atrial fibrillation: Secondary | ICD-10-CM | POA: Diagnosis not present

## 2024-05-08 DIAGNOSIS — E1151 Type 2 diabetes mellitus with diabetic peripheral angiopathy without gangrene: Secondary | ICD-10-CM

## 2024-05-08 DIAGNOSIS — E785 Hyperlipidemia, unspecified: Secondary | ICD-10-CM | POA: Diagnosis not present

## 2024-05-08 DIAGNOSIS — I130 Hypertensive heart and chronic kidney disease with heart failure and stage 1 through stage 4 chronic kidney disease, or unspecified chronic kidney disease: Secondary | ICD-10-CM

## 2024-05-08 DIAGNOSIS — N183 Chronic kidney disease, stage 3 unspecified: Secondary | ICD-10-CM

## 2024-05-08 DIAGNOSIS — I6381 Other cerebral infarction due to occlusion or stenosis of small artery: Secondary | ICD-10-CM | POA: Diagnosis not present

## 2024-05-08 DIAGNOSIS — R29712 NIHSS score 12: Secondary | ICD-10-CM | POA: Diagnosis not present

## 2024-05-08 DIAGNOSIS — Z794 Long term (current) use of insulin: Secondary | ICD-10-CM

## 2024-05-08 DIAGNOSIS — I69391 Dysphagia following cerebral infarction: Secondary | ICD-10-CM

## 2024-05-08 LAB — GLUCOSE, CAPILLARY
Glucose-Capillary: 110 mg/dL — ABNORMAL HIGH (ref 70–99)
Glucose-Capillary: 237 mg/dL — ABNORMAL HIGH (ref 70–99)
Glucose-Capillary: 238 mg/dL — ABNORMAL HIGH (ref 70–99)
Glucose-Capillary: 98 mg/dL (ref 70–99)

## 2024-05-08 MED ORDER — INSULIN GLARGINE 100 UNIT/ML ~~LOC~~ SOLN: 5.0000 [IU] | Freq: Two times a day (BID) | SUBCUTANEOUS | Status: DC

## 2024-05-08 MED ORDER — INSULIN GLARGINE 100 UNIT/ML ~~LOC~~ SOLN: 5.0000 [IU] | Freq: Every day | SUBCUTANEOUS | Status: AC

## 2024-05-08 NOTE — Plan of Care (Signed)

## 2024-05-08 NOTE — TOC Transition Note (Signed)
 Transition of Care [facility]) - Discharge Note   Patient Details  Name: Connor Dixon MRN: [id number] Date of Birth: January 02, 1935  Transition of Care [facility]) CM/SW Contact:  Lendia Dais, LCSWA Phone Number: 05/08/2024, 11:14 AM   Clinical Narrative:  Pt is discharging and returning to [facility], RN report to [phone]. PT[facility] called at 11AM.  CSW spoke to [name] (son) via phone and informed him of pt's discharge and transport. [name] had questions about hospice stating that [facility] mentioned the CSW could set up outpatient hospice. CSW stated that would have to be recommended by a Palliative practitioner and that palliative had not seen this pt during this admission and referred [name] to follow up with [facility]. [name] stated understanding.  No further TOC needs.    Final next level of care: Assisted Living Barriers to Discharge: Barriers Resolved   Patient Goals and CMS Choice Patient states their goals for this hospitalization and ongoing recovery are:: pt oriented x1 only          Discharge Placement              Patient chooses bed at: [facility] [place] Patient to be transferred to facility by: [facility] Name of family member notified: [name] Patient and family notified of of transfer: 05/08/24  Discharge Plan and Services Additional resources added to the After Visit Summary for   In-house Referral: Clinical Social Work                                   Social Drivers of Health (SDOH) Interventions SDOH Screenings   Food Insecurity: Patient Unable To Answer (05/05/2024)  Housing: Patient Unable To Answer (05/05/2024)  Transportation Needs: Patient Unable To Answer (05/05/2024)  Utilities: Patient Unable To Answer (05/05/2024)  Alcohol  Screen: Low Risk (04/06/2023)  Depression (PHQ2-9): Low Risk (09/06/2023)  Financial Resource Strain: Low Risk (04/06/2023)  Physical Activity: Inactive (04/06/2023)  Social Connections: Patient Unable  To Answer (05/05/2024)  Stress: No Stress Concern Present (04/06/2023)  Tobacco Use: Low Risk (05/04/2024)  Health Literacy: Adequate Health Literacy (04/06/2023)     Readmission Risk Interventions     No data to display

## 2024-05-08 NOTE — Progress Notes (Addendum)
 Called report to lantana, LPN at [facility].   Amado GORMAN Arabia, RN

## 2024-05-08 NOTE — Progress Notes (Signed)
 STROKE TEAM PROGRESS NOTE    INTERIM HISTORY/SUBJECTIVE  89 y.o. male with hx of DM2, HTN, HLD, Afibb on eliquis  and complete heart block, vascular dementia who is currently admitted with hypernatremia and AKI. He was noted to have a L facial droop for which MRI brain obtained and shows a subacute L cerebellar stroke along with a small R white matter stroke.  Prior to admission, patient on Eliquis , which is maximal medical management.  OBJECTIVE  CBC    Component Value Date/Time   WBC 7.6 05/04/2024 1813   RBC 4.88 05/04/2024 1813   HGB 15.3 05/04/2024 1813   HGB 15.9 02/08/2019 1058   HCT 48.8 05/04/2024 1813   HCT 47.7 02/08/2019 1058   PLT 218 05/04/2024 1813   PLT 191 02/08/2019 1058   MCV 100.0 05/04/2024 1813   MCV 91 02/08/2019 1058   MCH 31.4 05/04/2024 1813   MCHC 31.4 05/04/2024 1813   RDW 18.0 (H) 05/04/2024 1813   RDW 12.4 02/08/2019 1058   LYMPHSABS 2.0 05/04/2024 1813   LYMPHSABS 2.0 02/08/2019 1058   MONOABS 0.6 05/04/2024 1813   EOSABS 0.2 05/04/2024 1813   EOSABS 0.2 02/08/2019 1058   BASOSABS 0.0 05/04/2024 1813   BASOSABS 0.0 02/08/2019 1058    BMET    Component Value Date/Time   NA 146 (H) 05/07/2024 0807   NA 139 02/08/2019 1058   K 3.7 05/07/2024 0807   CL 113 (H) 05/07/2024 0807   CO2 24 05/07/2024 0807   GLUCOSE 99 05/07/2024 0807   BUN 29 (H) 05/07/2024 0807   BUN 47 (H) 02/08/2019 1058   CREATININE 1.77 (H) 05/07/2024 0807   CREATININE 2.17 (H) 12/10/2019 1046   CALCIUM 8.7 (L) 05/07/2024 0807   GFRNONAA 37 (L) 05/07/2024 0807   GFRNONAA 27 (L) 12/10/2019 1046    IMAGING past 24 hours MR BRAIN WO CONTRAST Result Date: 05/07/2024 EXAM: MRI Brain Without Contrast 05/07/2024 04:36:31 PM TECHNIQUE: Multiplanar multisequence MRI of the head/brain was performed without the administration of intravenous contrast. COMPARISON: MRI head October 17, 2023 CLINICAL HISTORY: Transient ischemic attack (TIA) FINDINGS: Motion limited study. BRAIN AND  VENTRICLES: Small acute infarct in the right frontal white matter. Subtle hyperintense diffusion signal in the left cerebellum without definite ADC correlate, most likely subacute infarct. No intracranial hemorrhage. No mass. No midline shift. No hydrocephalus. The sella is unremarkable. Normal flow voids. Cerebral atrophy. Moderate T2 hyperintensities in the white matter, compatible with chronic microvascular ischemic change. ORBITS: No acute abnormality. SINUSES AND MASTOIDS: No acute abnormality. BONES AND SOFT TISSUES: Normal marrow signal. No acute soft tissue abnormality. IMPRESSION: 1. Small acute infarct in the right frontal white matter. 2. Suspected subacute left cerebellar infarct. 3. Moderate chronic microvascular ischemic change and cerebral atrophy. Electronically signed by: Connor Dixon 05/07/2024 06:16 PM EST RP Workstation: [id number]    Vitals:   05/07/24 1722 05/07/24 2031 05/08/24 0520 05/08/24 0747  BP: 124/81 106/65 (!) 103/56 (!) 108/57  Pulse: (!) 110 93 85 87  Resp: 19 18 18 19   Temp: 98.3 F (36.8 C) 98.2 F (36.8 C) 98.2 F (36.8 C) 98.3 F (36.8 C)  TempSrc:   Oral   SpO2: 100% 99% 97% 97%  Weight:      Height:         PHYSICAL EXAM General:  Alert, well-nourished, well-developed patient in no acute distress Psych:  Mood and affect appropriate for situation CV: Regular rate and rhythm on monitor Respiratory:  Regular, unlabored respirations on room  air GI: Abdomen soft and nontender  NEURO:  Mental Status: Awake, drowsy, oriented to self only.  Follows simple commands intermittently. Speech/Language: Mild dysarthria. No aphasia  Cranial Nerves:  II: PERRL. Visual fields full.  III, IV, VI: EOMI. Eyelids elevate symmetrically.  V: Sensation is intact to light touch and symmetrical to face.  VII: Left facial droop VIII: hearing intact to voice. IX, X: Mild dysarthria.  KP:Dynloizm shrug 5/5. XII: tongue is midline without fasciculations. Motor:   Generalized weakness, only antigravity strength throughout. With greater weakness in bilateral lower extremities.  RUE: 4-/5 LUE: 3/5 BLE: 3/5 Tone: is normal and bulk is normal Sensation- Decreased to right arm.  Coordination: FTN intact bilaterally, HKS: no ataxia in BLE.No drift.  Gait- deferred  Most Recent NIH 12.    ASSESSMENT/PLAN  [name] is a 89 y.o. male with history of  DM2, HTN, HLD, Afibb on eliquis  and complete heart block, vascular dementia who is currently admitted with hypernatremia and AKI. He was noted to have a L facial droop for which MRI brain obtained and shows a subacute L cerebellar stroke along with a small R white matter stroke. NIH on Admission 20.  Acute Ischemic Infarct:  right frontal white matter  Etiology: Continuation of severe white matter disease, chronic small vessel ischemic disease, while on OAC CT head  No CT evidence for acute intracranial abnormality. Atrophy and chronic small vessel ischemic changes of the white matter.  Chronic appearing lacunar infarcts in the bilateral basal ganglia and right periventricular white matter. MRI   Small acute infarct in the right frontal white matter. Suspected subacute left cerebellar infarct. Moderate chronic microvascular ischemic change and cerebral atrophy. 2D Echo 12/27/2023: LVEF 25 to 30%, moderately dilated left atria, mildly dilated right atria, mild MVR LDL 122 HgbA1c 9.1 VTE prophylaxis - Eliquis  Eliquis  2.5 mg twice daily prior to admission, continue Eliquis  2.5 mg twice daily. Maximal medical management. Disposition: Okay to discharge from neurological standpoint 12/24.  Plan for DC to SNF.  Hx of Stroke/TIA Chronic lacunar infarcts bilateral basal ganglia and right periventricular ventricular white matter seen on this admission's MRI 10/2023: Remote white matter infarcts in the right corona radiata and a remote lacunar infarct in the right lenctiform nucleus noted on MRI,  admission for encephalopathy.   History of encephalopathy  History of vascular dementia Admission June 2025 No evidence of seizure on prolonged EEG, no evidence of acute stroke on MRI. Outpatient consult at [facility] neurology movement disorders clinic recommended regarding possible Lewy body dementia on this admission.  Outpatient neurology, Dr. Skeet office visit 11/22/2023 states that he does not suspect Lewy body dementia as patient had no preceding history of hallucinations and frequent falls.  Follow-up visit 02/16/2024 notes that patient needs assistant with ADLs, no indication for antidementia medications given his advanced disease as they are no longer therapeutic and the risk of these medications outweighs the benefit of them.  Per their notes, possibility of memory care was discussed with his son during this visit. Recommend continued outpatient follow-up B12/folate continued  Hypertension S/p Pacemaker 2012 pAFib CHFrEF (EF 25%) Home meds: Hydralazine  10 mg 3 times daily, Toprol -XL 25 mg daily Home BP meds on hold.  May resume metoprolol  in the outpatient setting in the next several days if develops RVR  Stable BP goal: Aim for normotension.  Avoid hypotension due to possible worsening of strokelike symptoms secondary to his under lying vascular dementia.  Hyperlipidemia Home meds:  none, documented statin  intolerance LDL 122, goal < 70 Add Zetia  High intensity statin not indicated due to documented statin intolerance Continue on discharge  Diabetes type II Uncontrolled Home meds: Insulin  HgbA1c 9.1, goal < 7.0 CBGs SSI Recommend close follow-up with PCP for better DM control with A1c repeat testing.   Dysphagia Patient has post-stroke dysphagia, SLP consulted    Diet   DIET - DYS 1 Room service appropriate? Yes; Fluid consistency: Thin   Advance diet as tolerated  Other Stroke Risk Factors Family hx stroke (father) CKD Stage 3  Hospital day # 4   Pt seen by  Neuro NP/APP and later by MD. Note/plan to be edited by MD as needed.    Connor JAYSON Likes, DNP [facility] Please use AMION for contact information & EPIC for messaging.     To contact Stroke Continuity provider, please refer to [email or web address]. After hours, contact General Neurology

## 2024-05-08 NOTE — Discharge Summary (Signed)
 Physician Discharge Summary  Connor Dixon FMW:[id number] DOB: Dec 10, 1934 DOA: 05/04/2024  PCP: [name]  Admit date: 05/04/2024 Discharge date: 05/08/2024  Time spent: 60 minutes  Recommendations for Outpatient Follow-up:  Needs outpatient follow-up with Dr. Skeet in 1 to 2 weeks given new strokes Would be very careful with fluid medication and blood pressure meds in the setting of new stroke so we have held torsemide  completely and should resume Toprol  low-dose Repeat A1c in about 3 months when his blood sugar is better controlled-lab was done when he was in DKA  Discharge Diagnoses:  MAIN problem for hospitalization   New strokes in the setting of toxic metabolic encephalopathy secondary to severe hypernatremia on admission Recent admission for DKA   Please see below for itemized issues addressed in HO[facility]- refer to other progress notes for clarity if needed  Discharge Condition: Improved but overall guarded  Diet recommendation: Dysphagia 1  Filed Weights   05/05/24 0219  Weight: 69.1 kg    History of present illness:  64 male HTN HLD  complete heart block/PA fib Chad2> 4  with PPM and HFrEF EF 25-30% DM TY 2--- previously has had Proteus UTI with hematuria Known Encephalopathy with some Parkinsonism/Vascular dementia Recent hospitalization 12/9-12/12 for urinary retention DKA in the setting of moderate constipation-thought was also for sepsis Treated for DKA started on dysphagia 1 diet and needed to be fed-transitioned to doxycycline  at discharge completing 5 days antibiotics and discharged back to facility   Sent over from [facility] because of abnormal labs-MAR seems to indicate that he is on Demadex     Assessment  & Plan :      Toxic metabolic encephalopathy 2/2 AKI/severe hypernatremia Resolving well----was placed on fluids which were ultimately discontinued Resumed majority of necessary meds as below   Facial droop L sided--Ct head neg  for CVA  because hypoperfusion can cause worsening of strokelike symptoms underlying vascular dementia with negative autoimmune panel underlying vascular dementia with negative autoimmune panel at previous admission-see discharge summary 11/08/2023 D/w son who tells me usually the droop is not there--first obs during hospitalization may 2025---extensive w/u including LP etc etc Recurred after recent DKA admit MRI was eventually performed which showed new subacute left cerebellar stroke and small R white matter stroke--- I do feel that the patient has maximally met hospitalization benefit-he is unable to take statin because of allergy---Neuro agrees with POC--cc Dr. Skeet I would stop for the time being any antihypertensives such as hydralazine  metoprolol  A1c recently was 9.1 but in setting of DKA   Transient urinary retention Retaining about 400 cc earlier but was able to pass urine when they were about to place Foley Continue Flomax  0.4, As cannot void will need urology follow-up arranged by nursing facility   Complete heart block paroxysmal A-fib PPM in place HFrEF 25% Stopped metoprolol  XL 25, --holding --->Demadex  20 hydralazine  10 for now Continues Elliquis 2.5 twice daily - May resume metoprolol  in the outpatient setting in the next several days if develops RVR   Diabetes mellitus with with recent admission for DKA (Home meds 7 units twice daily long-acting insulin ) Home meds were resumed but with a lower dose of 5 units Lantus  daily, sliding scale instructions ssi   Vascular dementia-CSF autoimmune panel was not obtainable vascular dementia with negative autoimmune panel at previous admission-see discharge summary 11/08/2023 Will need outpatient follow-up with Dr. Skeet who is treating him for combined vascular dementia etc. Resume cyanocobalamin  /folate    Discharge Exam:  Vitals:   05/08/24 0520 05/08/24 0747  BP: (!) 103/56 (!) 108/57  Pulse: 85 87  Resp: 18 19  Temp: 98.2 F  (36.8 C) 98.3 F (36.8 C)  SpO2: 97% 97%    Subj on day of d/c   Confused but orientable  General Exam on discharge  Eomi ncat no focal deficit Abd soft nt nd no rebound S1 s 2no m--afib on monitors rate controlled Droop to L lip  Discharge Instructions   Discharge Instructions     Discharge instructions   Complete by: As directed    This hospital stay you were diagnosed with a new stroke-we have adjusted some your medications and discontinued for the time being temporarily your metoprolol  XL which can be resumed in several days I would not use torsemide  long-term and would only use as needed according to fluid status as well as weight which needs to be determined at the facility You need close follow-up going forward with your neurologist Dr. Skeet   Increase activity slowly   Complete by: As directed       Allergies as of 05/08/2024       Reactions   Shellfish Allergy Hives, Swelling   Citrus Hives   Just lemons   Pecan Extract Hives   Apple Other (See Comments)   Sore throat   Cabbage Other (See Comments)   Unknown reaction - Not listed on the [facility]   Lemon Oil Hives   Walnut Other (See Comments)   Unknown reaction - Not listed on the [facility]   Farxiga  [dapagliflozin ] Other (See Comments)   Low blood sugar - Not listed on the [facility]        Medication List     STOP taking these medications    Glucose 15 GM/32ML Gel   hydrALAZINE  10 MG tablet Commonly known as: APRESOLINE    levocetirizine 5 MG tablet Commonly known as: XYZAL    loratadine  10 MG tablet Commonly known as: CLARITIN    metoprolol  succinate 25 MG 24 hr tablet Commonly known as: TOPROL -XL   polyethylene glycol 17 g packet Commonly known as: MIRALAX  / GLYCOLAX    senna-docusate 8.6-50 MG tablet Commonly known as: Senokot-S   torsemide  20 MG tablet Commonly known as: DEMADEX        TAKE these medications    acetaminophen  325 MG tablet Commonly known as: TYLENOL  Take 650 mg by mouth  every 8 (eight) hours as needed (pain).   allopurinol  100 MG tablet Commonly known as: ZYLOPRIM  TAKE 1 TABLET(100 MG) BY MOUTH DAILY   apixaban  2.5 MG Tabs tablet Commonly known as: Eliquis  Take 1 tablet (2.5 mg total) by mouth 2 (two) times daily.   calcitRIOL  0.25 MCG capsule Commonly known as: ROCALTROL  TAKE 1 CAPSULE BY MOUTH DAILY RETURN IN ABOUT 6 MONTHS(AROUND 02/04/2023)   cyanocobalamin  1000 MCG tablet Take 1 tablet (1,000 mcg total) by mouth daily.   Dexcom G7 Sensor Misc 1 Act by Does not apply route daily. What changed:  how much to take how to take this when to take this additional instructions   feeding supplement (GLUCERNA SHAKE) Liqd Take 237 mLs by mouth daily. What changed:  when to take this additional instructions   ferrous sulfate  325 (65 FE) MG tablet Take 1 tablet (325 mg total) by mouth daily with breakfast.   folic acid  1 MG tablet Commonly known as: FOLVITE  Take 1 tablet (1 mg total) by mouth daily.   insulin  aspart 100 UNIT/ML FlexPen Commonly known as: NOVOLOG  Inject 3-9 Units  into the skin See admin instructions. Inject 3 units into the skin three times daily with meals IN ADDITION to 0-6 units per sliding scale: 000 - 150 : 0 unit 151 - 190 : 1 unit 191 - 230 : 2 units 231 - 270 : 3 units 271 - 310 : 4 units 311 - 350 : 5 units 351 - 399 : 6 units >= 400 : notify MD   insulin  glargine 100 UNIT/ML injection Commonly known as: LANTUS  Inject 0.05 mLs (5 Units total) into the skin daily. What changed:  how much to take when to take this   tamsulosin  0.4 MG Caps capsule Commonly known as: FLOMAX  Take 1 capsule (0.4 mg total) by mouth daily.   Vitamin D3 1000 units Caps Take 1,000 Units by mouth daily.       Allergies[1]    The results of significant diagnostics from this hospitalization (including imaging, microbiology, ancillary and laboratory) are listed below for reference.    Significant Diagnostic Studies: MR BRAIN  WO CONTRAST Result Date: 05/07/2024 EXAM: MRI Brain Without Contrast 05/07/2024 04:36:31 PM TECHNIQUE: Multiplanar multisequence MRI of the head/brain was performed without the administration of intravenous contrast. COMPARISON: MRI head October 17, 2023 CLINICAL HISTORY: Transient ischemic attack (TIA) FINDINGS: Motion limited study. BRAIN AND VENTRICLES: Small acute infarct in the right frontal white matter. Subtle hyperintense diffusion signal in the left cerebellum without definite ADC correlate, most likely subacute infarct. No intracranial hemorrhage. No mass. No midline shift. No hydrocephalus. The sella is unremarkable. Normal flow voids. Cerebral atrophy. Moderate T2 hyperintensities in the white matter, compatible with chronic microvascular ischemic change. ORBITS: No acute abnormality. SINUSES AND MASTOIDS: No acute abnormality. BONES AND SOFT TISSUES: Normal marrow signal. No acute soft tissue abnormality. IMPRESSION: 1. Small acute infarct in the right frontal white matter. 2. Suspected subacute left cerebellar infarct. 3. Moderate chronic microvascular ischemic change and cerebral atrophy. Electronically signed by: Gilmore Molt 05/07/2024 06:16 PM EST RP Workstation: [id number]   CT CHEST ABDOMEN PELVIS WO CONTRAST Result Date: 04/23/2024 CLINICAL DATA:  Sepsis altered EXAM: CT CHEST, ABDOMEN AND PELVIS WITHOUT CONTRAST TECHNIQUE: Multidetector CT imaging of the chest, abdomen and pelvis was performed following the standard protocol without IV contrast. RADIATION DOSE REDUCTION: This exam was performed according to the departmental dose-optimization program which includes automated exposure control, adjustment of the mA and/or kV according to patient size and/or use of iterative reconstruction technique. COMPARISON:  CT 01/25/2024, 10/14/2023 FINDINGS: CT CHEST FINDINGS Cardiovascular: Limited without intravenous contrast. Left-sided multi lead pacing device. Aortic atherosclerosis without  aneurysm. Multi vessel coronary vascular calcification. Normal cardiac size. No pericardial effusion Mediastinum/Nodes: Patent trachea. No thyroid  mass. Small gas bubbles in the left jugular vein presumably due to line placement. No suspicious lymph nodes. Esophagus within normal limits. Lungs/Pleura: Few small foci of nodular airspace disease and ground-glass density in the left upper lobe, series 6, image 63 through 67, lingula, series 6, image 77, and mild peribronchovascular nodularity in the left lower lobe, series 6, image 110. Mild focus of consolidation in the medial left base. Musculoskeletal: Sternum appears intact. No acute osseous abnormality. Stable to decreased size of ovoid nodular density in the left breast. CT ABDOMEN PELVIS FINDINGS Hepatobiliary: Poorly visible gallbladder, either severely contracted or absent. No biliary dilatation. Pancreas: Unremarkable. No pancreatic ductal dilatation or surrounding inflammatory changes. Spleen: Normal in size without focal abnormality. Adrenals/Urinary Tract: Adrenal glands are stable in appearance. There is no hydronephrosis. The bladder contains Foley catheter and appears thick  walled. Small gas within the bladder lumen. Stomach/Bowel: Moderate fluid distension of the stomach. No dilated small bowel. Moderate stool in the colon with large fecal impaction at the rectum, rectum distended up to 9 cm. No acute bowel wall thickening Vascular/Lymphatic: Moderate severe atherosclerosis. No aneurysm. No suspicious lymph nodes. Reproductive: Enlarged prostate Other: No ascites or free air Musculoskeletal: Right hip replacement with artifact. No acute osseous abnormality. Abnormal asymmetrical edema within the soft tissues of the left chest wall, axilla, and included portion of the left upper extremity. IMPRESSION: 1. Few small foci of nodular airspace disease and ground-glass density in the left upper lobe/lingula, and left lower lobe, suspect for mild pneumonia. 2.  Moderate stool in the colon with large fecal impaction at the rectum. Rectal distension up to 9 cm without definitive rectal wall thickening. 3. Marked circumferential thick-walled urinary bladder with Foley catheter in place. 4. Abnormal asymmetrical edema within the soft tissues of the left chest wall, axilla, and included portion of the left upper extremity. Further assessment limited without contrast. Correlate for any signs or symptoms of infection/cellulitis 5. Aortic atherosclerosis. Aortic Atherosclerosis (ICD10-I70.0). Electronically Signed   By: Luke Bun M.D.   On: 04/23/2024 18:01   CT Head Wo Contrast Result Date: 04/23/2024 CLINICAL DATA:  Delirium EXAM: CT HEAD WITHOUT CONTRAST TECHNIQUE: Contiguous axial images were obtained from the base of the skull through the vertex without intravenous contrast. RADIATION DOSE REDUCTION: This exam was performed according to the departmental dose-optimization program which includes automated exposure control, adjustment of the mA and/or kV according to patient size and/or use of iterative reconstruction technique. COMPARISON:  CT brain 12/26/2023, MRI 10/17/2023 FINDINGS: Brain: No acute territorial infarction, hemorrhage, or intracranial mass. There are chronic appearing lacunar infarcts in the bilateral basal ganglia and right periventricular white matter. Atrophy and moderate chronic small vessel ischemic changes of the white matter. Stable ventricle size Vascular: No hyperdense vessels. Vertebral and carotid vascular calcification Skull: Normal. Negative for fracture or focal lesion. Sinuses/Orbits: No acute finding. Other: None IMPRESSION: 1. No CT evidence for acute intracranial abnormality. 2. Atrophy and chronic small vessel ischemic changes of the white matter. Chronic appearing lacunar infarcts in the bilateral basal ganglia and right periventricular white matter. Electronically Signed   By: Luke Bun M.D.   On: 04/23/2024 17:43   DG Chest  Port 1 View Result Date: 04/23/2024 CLINICAL DATA:  Altered mental status.  Hypotensive. EXAM: PORTABLE CHEST 1 VIEW COMPARISON:  01/25/2024. FINDINGS: Defibrillator pad overlies the left chest. The heart size and mediastinal contours are unchanged. Stable left-sided pacemaker. Bronchovascular crowding versus pulmonary vascular congestion. No focal consolidation, pleural effusion, or pneumothorax. No acute osseous abnormality. IMPRESSION: Low lung volumes with bronchovascular crowding versus pulmonary vascular congestion. Electronically Signed   By: Harrietta Sherry M.D.   On: 04/23/2024 12:29    Microbiology: Recent Results (from the past 240 hours)  MRSA Next Gen by PCR, Nasal     Status: None   Collection Time: 05/05/24  7:15 AM   Specimen: Nasal Mucosa; Nasal Swab  Result Value Ref Range Status   MRSA by PCR Next Gen NOT DETECTED NOT DETECTED Final    Comment: (NOTE) The GeneXpert MRSA Assay (FDA approved for NASAL specimens only), is one component of a comprehensive MRSA colonization surveillance program. It is not intended to diagnose MRSA infection nor to guide or monitor treatment for MRSA infections. Test performance is not FDA approved in patients less than 55 years old. Performed at [facility], [address] [place], [place] [address]      Labs: Basic Metabolic Panel: Recent Labs  Lab 05/05/24 0615 05/05/24 1658 05/05/24 2247 05/06/24 0422 05/07/24 0807  NA 160* 151* 151* 147* 146*  K 3.9 3.7 3.5 3.6 3.7  CL 119* 114* 114* 110 113*  CO2 28 27 29 29 24   GLUCOSE 174* 219* 217* 200* 99  BUN 51* 44* 39* 36* 29*  CREATININE 2.68* 2.28* 2.20* 2.01* 1.77*  CALCIUM 9.8 9.1 8.7* 8.2* 8.7*  PHOS  --  3.1 2.6 2.6  --    Liver Function Tests: Recent Labs  Lab 05/04/24 1813 05/05/24 1658 05/05/24 2247 05/06/24 0422  AST 20  --   --   --   ALT 5  --   --   --   ALKPHOS 74  --   --   --   BILITOT 1.1  --   --   --   PROT 6.9  --   --   --   ALBUMIN 3.6 3.0*  2.8* 3.2*   No results for input(s): LIPASE, AMYLASE in the last 168 hours. No results for input(s): AMMONIA in the last 168 hours. CBC: Recent Labs  Lab 05/04/24 1813  WBC 7.6  NEUTROABS 4.8  HGB 15.3  HCT 48.8  MCV 100.0  PLT 218   Cardiac Enzymes: No results for input(s): CKTOTAL, CKMB, CKMBINDEX, TROPONINI in the last 168 hours. BNP: BNP (last 3 results) Recent Labs    12/26/23 0659  BNP 248.8*    ProBNP (last 3 results) No results for input(s): PROBNP in the last 8760 hours.  CBG: Recent Labs  Lab 05/07/24 1726 05/07/24 2050 05/08/24 0011 05/08/24 0411 05/08/24 0746  GLUCAP 279* 88 238* 110* 98    Signed:  Colen Grimes MD   [facility] 05/08/2024, 10:12 AM      [1]  Allergies Allergen Reactions   Shellfish Allergy Hives and Swelling   Citrus Hives    Just lemons   Pecan Extract Hives   Apple Other (See Comments)    Sore throat   Cabbage Other (See Comments)    Unknown reaction - Not listed on the [facility]   Lemon Oil Hives   Walnut Other (See Comments)    Unknown reaction - Not listed on the [facility]   Farxiga  [Dapagliflozin ] Other (See Comments)    Low blood sugar - Not listed on the [facility]

## 2024-05-24 ENCOUNTER — Ambulatory Visit: Payer: Medicare PPO

## 2024-05-24 DIAGNOSIS — I480 Paroxysmal atrial fibrillation: Secondary | ICD-10-CM

## 2024-05-27 LAB — CUP PACEART REMOTE DEVICE CHECK
Battery Remaining Longevity: 25 mo
Battery Remaining Percentage: 26 %
Battery Voltage: 2.93 V
Brady Statistic AP VP Percent: 1 %
Brady Statistic AP VS Percent: 1 %
Brady Statistic AS VP Percent: 99 %
Brady Statistic AS VS Percent: 1 %
Brady Statistic RA Percent Paced: 1 %
Brady Statistic RV Percent Paced: 99 %
Date Time Interrogation Session: 20260109040013
Implantable Lead Connection Status: 753985
Implantable Lead Connection Status: 753985
Implantable Lead Implant Date: 20111202
Implantable Lead Implant Date: 20111202
Implantable Lead Location: 753859
Implantable Lead Location: 753860
Implantable Pulse Generator Implant Date: 20200930
Lead Channel Impedance Value: 410 Ohm
Lead Channel Impedance Value: 430 Ohm
Lead Channel Pacing Threshold Amplitude: 0.5 V
Lead Channel Pacing Threshold Amplitude: 1.375 V
Lead Channel Pacing Threshold Pulse Width: 0.4 ms
Lead Channel Pacing Threshold Pulse Width: 0.8 ms
Lead Channel Sensing Intrinsic Amplitude: 2.8 mV
Lead Channel Sensing Intrinsic Amplitude: 4.8 mV
Lead Channel Setting Pacing Amplitude: 1.625
Lead Channel Setting Pacing Amplitude: 2 V
Lead Channel Setting Pacing Pulse Width: 0.8 ms
Lead Channel Setting Sensing Sensitivity: 4 mV
Pulse Gen Model: 2272
Pulse Gen Serial Number: 9156495

## 2024-05-28 ENCOUNTER — Ambulatory Visit: Payer: Self-pay | Admitting: Cardiovascular Disease

## 2024-05-28 NOTE — Progress Notes (Signed)
 Remote PPM Transmission

## 2024-06-16 DEATH — deceased
